# Patient Record
Sex: Female | Born: 1935 | Race: White | Hispanic: No | Smoking: Never smoker
Health system: Southern US, Community
[De-identification: ages and names within clinical notes are randomized; demographics above are authoritative.]

## PROBLEM LIST (undated history)

## (undated) DIAGNOSIS — E039 Hypothyroidism, unspecified: Secondary | ICD-10-CM

## (undated) DIAGNOSIS — M1990 Unspecified osteoarthritis, unspecified site: Secondary | ICD-10-CM

## (undated) DIAGNOSIS — K449 Diaphragmatic hernia without obstruction or gangrene: Secondary | ICD-10-CM

## (undated) DIAGNOSIS — K2970 Gastritis, unspecified, without bleeding: Secondary | ICD-10-CM

## (undated) DIAGNOSIS — S2232XA Fracture of one rib, left side, initial encounter for closed fracture: Secondary | ICD-10-CM

## (undated) DIAGNOSIS — J189 Pneumonia, unspecified organism: Secondary | ICD-10-CM

## (undated) DIAGNOSIS — K5790 Diverticulosis of intestine, part unspecified, without perforation or abscess without bleeding: Secondary | ICD-10-CM

## (undated) DIAGNOSIS — R932 Abnormal findings on diagnostic imaging of liver and biliary tract: Secondary | ICD-10-CM

## (undated) DIAGNOSIS — M5030 Other cervical disc degeneration, unspecified cervical region: Secondary | ICD-10-CM

## (undated) DIAGNOSIS — E049 Nontoxic goiter, unspecified: Secondary | ICD-10-CM

## (undated) DIAGNOSIS — K219 Gastro-esophageal reflux disease without esophagitis: Secondary | ICD-10-CM

## (undated) DIAGNOSIS — F32A Depression, unspecified: Secondary | ICD-10-CM

## (undated) DIAGNOSIS — D131 Benign neoplasm of stomach: Secondary | ICD-10-CM

## (undated) DIAGNOSIS — F419 Anxiety disorder, unspecified: Secondary | ICD-10-CM

## (undated) DIAGNOSIS — N390 Urinary tract infection, site not specified: Secondary | ICD-10-CM

## (undated) DIAGNOSIS — G4733 Obstructive sleep apnea (adult) (pediatric): Secondary | ICD-10-CM

## (undated) DIAGNOSIS — J449 Chronic obstructive pulmonary disease, unspecified: Secondary | ICD-10-CM

## (undated) DIAGNOSIS — J45909 Unspecified asthma, uncomplicated: Secondary | ICD-10-CM

## (undated) DIAGNOSIS — E559 Vitamin D deficiency, unspecified: Secondary | ICD-10-CM

## (undated) DIAGNOSIS — I359 Nonrheumatic aortic valve disorder, unspecified: Secondary | ICD-10-CM

## (undated) DIAGNOSIS — M810 Age-related osteoporosis without current pathological fracture: Secondary | ICD-10-CM

## (undated) DIAGNOSIS — F329 Major depressive disorder, single episode, unspecified: Secondary | ICD-10-CM

## (undated) DIAGNOSIS — K317 Polyp of stomach and duodenum: Secondary | ICD-10-CM

## (undated) DIAGNOSIS — K222 Esophageal obstruction: Secondary | ICD-10-CM

## (undated) DIAGNOSIS — K635 Polyp of colon: Secondary | ICD-10-CM

## (undated) DIAGNOSIS — E7800 Pure hypercholesterolemia, unspecified: Secondary | ICD-10-CM

## (undated) DIAGNOSIS — K2270 Barrett's esophagus without dysplasia: Secondary | ICD-10-CM

## (undated) DIAGNOSIS — K2210 Ulcer of esophagus without bleeding: Secondary | ICD-10-CM

## (undated) DIAGNOSIS — R109 Unspecified abdominal pain: Secondary | ICD-10-CM

## (undated) DIAGNOSIS — Z8719 Personal history of other diseases of the digestive system: Secondary | ICD-10-CM

## (undated) DIAGNOSIS — R55 Syncope and collapse: Secondary | ICD-10-CM

## (undated) DIAGNOSIS — G459 Transient cerebral ischemic attack, unspecified: Secondary | ICD-10-CM

## (undated) DIAGNOSIS — B029 Zoster without complications: Secondary | ICD-10-CM

## (undated) DIAGNOSIS — I350 Nonrheumatic aortic (valve) stenosis: Secondary | ICD-10-CM

## (undated) DIAGNOSIS — R7301 Impaired fasting glucose: Secondary | ICD-10-CM

## (undated) HISTORY — DX: Unspecified asthma, uncomplicated: J45.909

## (undated) HISTORY — DX: Zoster without complications: B02.9

## (undated) HISTORY — DX: Impaired fasting glucose: R73.01

## (undated) HISTORY — DX: Vitamin D deficiency, unspecified: E55.9

## (undated) HISTORY — PX: JOINT REPLACEMENT: SHX530

## (undated) HISTORY — DX: Gastritis, unspecified, without bleeding: K29.70

## (undated) HISTORY — DX: Esophageal obstruction: K22.2

## (undated) HISTORY — DX: Syncope and collapse: R55

## (undated) HISTORY — PX: COLONOSCOPY: SHX174

## (undated) HISTORY — DX: Hypothyroidism, unspecified: E03.9

## (undated) HISTORY — DX: Obstructive sleep apnea (adult) (pediatric): G47.33

## (undated) HISTORY — DX: Diaphragmatic hernia without obstruction or gangrene: K44.9

## (undated) HISTORY — PX: BREAST SURGERY: SHX581

## (undated) HISTORY — PX: TONSILLECTOMY: SUR1361

## (undated) HISTORY — DX: Polyp of colon: K63.5

## (undated) HISTORY — DX: Polyp of stomach and duodenum: K31.7

## (undated) HISTORY — DX: Barrett's esophagus without dysplasia: K22.70

## (undated) HISTORY — PX: ENUCLEATION: SHX628

## (undated) HISTORY — PX: REPLACEMENT UNICONDYLAR JOINT KNEE: SUR1227

## (undated) HISTORY — DX: Nonrheumatic aortic valve disorder, unspecified: I35.9

## (undated) HISTORY — DX: Urinary tract infection, site not specified: N39.0

## (undated) HISTORY — DX: Ulcer of esophagus without bleeding: K22.10

## (undated) HISTORY — DX: Age-related osteoporosis without current pathological fracture: M81.0

## (undated) HISTORY — DX: Chronic obstructive pulmonary disease, unspecified: J44.9

## (undated) HISTORY — PX: FRACTURE SURGERY: SHX138

## (undated) HISTORY — PX: HIP SURGERY: SHX245

## (undated) HISTORY — PX: EYE SURGERY: SHX253

## (undated) HISTORY — PX: COLONOSCOPY WITH ESOPHAGOGASTRODUODENOSCOPY (EGD): SHX5779

## (undated) HISTORY — PX: OTHER SURGICAL HISTORY: SHX169

## (undated) HISTORY — PX: BREAST EXCISIONAL BIOPSY: SUR124

## (undated) HISTORY — DX: Other cervical disc degeneration, unspecified cervical region: M50.30

---

## 2008-12-03 ENCOUNTER — Encounter: Payer: Self-pay | Admitting: Internal Medicine

## 2008-12-07 LAB — HM DIABETES FOOT EXAM: HM Diabetic Foot Exam: NORMAL

## 2011-02-26 ENCOUNTER — Emergency Department: Payer: Self-pay | Admitting: *Deleted

## 2011-02-26 DIAGNOSIS — R52 Pain, unspecified: Secondary | ICD-10-CM | POA: Diagnosis not present

## 2011-02-26 DIAGNOSIS — R6889 Other general symptoms and signs: Secondary | ICD-10-CM | POA: Diagnosis not present

## 2011-02-26 DIAGNOSIS — K8020 Calculus of gallbladder without cholecystitis without obstruction: Secondary | ICD-10-CM | POA: Diagnosis not present

## 2011-02-26 DIAGNOSIS — R109 Unspecified abdominal pain: Secondary | ICD-10-CM | POA: Diagnosis not present

## 2011-02-26 DIAGNOSIS — K5289 Other specified noninfective gastroenteritis and colitis: Secondary | ICD-10-CM | POA: Diagnosis not present

## 2011-02-26 LAB — CBC
HCT: 43.5 % (ref 35.0–47.0)
HGB: 14.5 g/dL (ref 12.0–16.0)
MCH: 30.8 pg (ref 26.0–34.0)
RBC: 4.71 10*6/uL (ref 3.80–5.20)

## 2011-02-26 LAB — COMPREHENSIVE METABOLIC PANEL
Albumin: 4 g/dL (ref 3.4–5.0)
Anion Gap: 10 (ref 7–16)
BUN: 12 mg/dL (ref 7–18)
Bilirubin,Total: 0.9 mg/dL (ref 0.2–1.0)
Co2: 30 mmol/L (ref 21–32)
EGFR (Non-African Amer.): >60
Glucose: 94 mg/dL (ref 65–99)
Osmolality: 283 (ref 275–301)
SGOT(AST): 21 U/L (ref 15–37)
SGPT (ALT): 20 U/L
Total Protein: 7 g/dL (ref 6.4–8.2)

## 2011-02-26 LAB — LIPASE, BLOOD: Lipase: 121 U/L (ref 73–393)

## 2011-02-28 ENCOUNTER — Emergency Department: Payer: Self-pay | Admitting: Emergency Medicine

## 2011-02-28 DIAGNOSIS — E7800 Pure hypercholesterolemia, unspecified: Secondary | ICD-10-CM | POA: Diagnosis not present

## 2011-02-28 DIAGNOSIS — E039 Hypothyroidism, unspecified: Secondary | ICD-10-CM | POA: Diagnosis not present

## 2011-02-28 DIAGNOSIS — K5289 Other specified noninfective gastroenteritis and colitis: Secondary | ICD-10-CM | POA: Diagnosis not present

## 2011-02-28 DIAGNOSIS — R197 Diarrhea, unspecified: Secondary | ICD-10-CM | POA: Diagnosis not present

## 2011-02-28 DIAGNOSIS — Z9889 Other specified postprocedural states: Secondary | ICD-10-CM | POA: Diagnosis not present

## 2011-02-28 DIAGNOSIS — Z79899 Other long term (current) drug therapy: Secondary | ICD-10-CM | POA: Diagnosis not present

## 2011-02-28 DIAGNOSIS — J45909 Unspecified asthma, uncomplicated: Secondary | ICD-10-CM | POA: Diagnosis not present

## 2011-02-28 LAB — CLOSTRIDIUM DIFFICILE BY PCR

## 2011-03-02 LAB — STOOL CULTURE

## 2011-03-22 DIAGNOSIS — E049 Nontoxic goiter, unspecified: Secondary | ICD-10-CM | POA: Diagnosis not present

## 2011-03-22 DIAGNOSIS — R109 Unspecified abdominal pain: Secondary | ICD-10-CM | POA: Diagnosis not present

## 2011-03-22 DIAGNOSIS — M810 Age-related osteoporosis without current pathological fracture: Secondary | ICD-10-CM | POA: Diagnosis not present

## 2011-03-22 DIAGNOSIS — K2270 Barrett's esophagus without dysplasia: Secondary | ICD-10-CM | POA: Diagnosis not present

## 2011-03-29 DIAGNOSIS — K2270 Barrett's esophagus without dysplasia: Secondary | ICD-10-CM | POA: Diagnosis not present

## 2011-03-29 DIAGNOSIS — Z86010 Personal history of colonic polyps: Secondary | ICD-10-CM | POA: Diagnosis not present

## 2011-03-29 DIAGNOSIS — A09 Infectious gastroenteritis and colitis, unspecified: Secondary | ICD-10-CM | POA: Diagnosis not present

## 2011-04-04 DIAGNOSIS — M25559 Pain in unspecified hip: Secondary | ICD-10-CM | POA: Diagnosis not present

## 2011-04-04 DIAGNOSIS — M779 Enthesopathy, unspecified: Secondary | ICD-10-CM | POA: Diagnosis not present

## 2011-04-13 ENCOUNTER — Ambulatory Visit: Payer: Self-pay | Admitting: Gastroenterology

## 2011-04-13 DIAGNOSIS — K222 Esophageal obstruction: Secondary | ICD-10-CM | POA: Diagnosis not present

## 2011-04-13 DIAGNOSIS — K2970 Gastritis, unspecified, without bleeding: Secondary | ICD-10-CM | POA: Diagnosis not present

## 2011-04-13 DIAGNOSIS — G4730 Sleep apnea, unspecified: Secondary | ICD-10-CM | POA: Diagnosis not present

## 2011-04-13 DIAGNOSIS — Z7982 Long term (current) use of aspirin: Secondary | ICD-10-CM | POA: Diagnosis not present

## 2011-04-13 DIAGNOSIS — Z79899 Other long term (current) drug therapy: Secondary | ICD-10-CM | POA: Diagnosis not present

## 2011-04-13 DIAGNOSIS — H5440 Blindness, one eye, unspecified eye: Secondary | ICD-10-CM | POA: Diagnosis not present

## 2011-04-13 DIAGNOSIS — J438 Other emphysema: Secondary | ICD-10-CM | POA: Diagnosis not present

## 2011-04-13 DIAGNOSIS — K2990 Gastroduodenitis, unspecified, without bleeding: Secondary | ICD-10-CM | POA: Diagnosis not present

## 2011-04-13 DIAGNOSIS — K2270 Barrett's esophagus without dysplasia: Secondary | ICD-10-CM | POA: Diagnosis not present

## 2011-04-13 DIAGNOSIS — E079 Disorder of thyroid, unspecified: Secondary | ICD-10-CM | POA: Diagnosis not present

## 2011-04-13 DIAGNOSIS — K2940 Chronic atrophic gastritis without bleeding: Secondary | ICD-10-CM | POA: Diagnosis not present

## 2011-04-13 DIAGNOSIS — Z86010 Personal history of colonic polyps: Secondary | ICD-10-CM | POA: Diagnosis not present

## 2011-04-13 DIAGNOSIS — R42 Dizziness and giddiness: Secondary | ICD-10-CM | POA: Diagnosis not present

## 2011-04-17 LAB — PATHOLOGY REPORT

## 2011-05-02 DIAGNOSIS — K2940 Chronic atrophic gastritis without bleeding: Secondary | ICD-10-CM | POA: Diagnosis not present

## 2011-05-03 DIAGNOSIS — Z79899 Other long term (current) drug therapy: Secondary | ICD-10-CM | POA: Diagnosis not present

## 2011-05-03 DIAGNOSIS — E039 Hypothyroidism, unspecified: Secondary | ICD-10-CM | POA: Diagnosis not present

## 2011-05-03 DIAGNOSIS — I10 Essential (primary) hypertension: Secondary | ICD-10-CM | POA: Diagnosis not present

## 2011-05-03 DIAGNOSIS — E7800 Pure hypercholesterolemia, unspecified: Secondary | ICD-10-CM | POA: Diagnosis not present

## 2011-05-14 DIAGNOSIS — E7800 Pure hypercholesterolemia, unspecified: Secondary | ICD-10-CM | POA: Diagnosis not present

## 2011-05-14 DIAGNOSIS — E049 Nontoxic goiter, unspecified: Secondary | ICD-10-CM | POA: Diagnosis not present

## 2011-05-14 DIAGNOSIS — M810 Age-related osteoporosis without current pathological fracture: Secondary | ICD-10-CM | POA: Diagnosis not present

## 2011-05-14 DIAGNOSIS — Z1239 Encounter for other screening for malignant neoplasm of breast: Secondary | ICD-10-CM | POA: Diagnosis not present

## 2011-06-26 ENCOUNTER — Ambulatory Visit: Payer: Self-pay | Admitting: Internal Medicine

## 2011-06-26 DIAGNOSIS — R928 Other abnormal and inconclusive findings on diagnostic imaging of breast: Secondary | ICD-10-CM | POA: Diagnosis not present

## 2011-06-26 DIAGNOSIS — M899 Disorder of bone, unspecified: Secondary | ICD-10-CM | POA: Diagnosis not present

## 2011-06-26 DIAGNOSIS — Z13820 Encounter for screening for osteoporosis: Secondary | ICD-10-CM | POA: Diagnosis not present

## 2011-06-26 DIAGNOSIS — Z1231 Encounter for screening mammogram for malignant neoplasm of breast: Secondary | ICD-10-CM | POA: Diagnosis not present

## 2011-06-26 DIAGNOSIS — M810 Age-related osteoporosis without current pathological fracture: Secondary | ICD-10-CM | POA: Diagnosis not present

## 2011-06-26 LAB — HM DEXA SCAN

## 2011-07-05 DIAGNOSIS — M1710 Unilateral primary osteoarthritis, unspecified knee: Secondary | ICD-10-CM | POA: Diagnosis not present

## 2011-07-05 DIAGNOSIS — IMO0002 Reserved for concepts with insufficient information to code with codable children: Secondary | ICD-10-CM | POA: Diagnosis not present

## 2011-07-05 DIAGNOSIS — M810 Age-related osteoporosis without current pathological fracture: Secondary | ICD-10-CM | POA: Diagnosis not present

## 2011-07-26 DIAGNOSIS — M1710 Unilateral primary osteoarthritis, unspecified knee: Secondary | ICD-10-CM | POA: Diagnosis not present

## 2011-07-26 DIAGNOSIS — M25559 Pain in unspecified hip: Secondary | ICD-10-CM | POA: Diagnosis not present

## 2011-09-06 DIAGNOSIS — M25569 Pain in unspecified knee: Secondary | ICD-10-CM | POA: Diagnosis not present

## 2011-09-07 DIAGNOSIS — M25569 Pain in unspecified knee: Secondary | ICD-10-CM | POA: Diagnosis not present

## 2011-09-10 DIAGNOSIS — M25569 Pain in unspecified knee: Secondary | ICD-10-CM | POA: Diagnosis not present

## 2011-09-11 DIAGNOSIS — R933 Abnormal findings on diagnostic imaging of other parts of digestive tract: Secondary | ICD-10-CM | POA: Diagnosis not present

## 2011-09-11 DIAGNOSIS — K2100 Gastro-esophageal reflux disease with esophagitis, without bleeding: Secondary | ICD-10-CM | POA: Diagnosis not present

## 2011-09-12 DIAGNOSIS — M25569 Pain in unspecified knee: Secondary | ICD-10-CM | POA: Diagnosis not present

## 2011-09-14 ENCOUNTER — Ambulatory Visit: Payer: Self-pay | Admitting: Gastroenterology

## 2011-09-14 DIAGNOSIS — K8020 Calculus of gallbladder without cholecystitis without obstruction: Secondary | ICD-10-CM | POA: Diagnosis not present

## 2011-09-14 DIAGNOSIS — R933 Abnormal findings on diagnostic imaging of other parts of digestive tract: Secondary | ICD-10-CM | POA: Diagnosis not present

## 2011-09-14 DIAGNOSIS — M25569 Pain in unspecified knee: Secondary | ICD-10-CM | POA: Diagnosis not present

## 2011-09-17 DIAGNOSIS — M25569 Pain in unspecified knee: Secondary | ICD-10-CM | POA: Diagnosis not present

## 2011-09-19 DIAGNOSIS — M25569 Pain in unspecified knee: Secondary | ICD-10-CM | POA: Diagnosis not present

## 2011-09-21 DIAGNOSIS — M25569 Pain in unspecified knee: Secondary | ICD-10-CM | POA: Diagnosis not present

## 2011-09-23 DIAGNOSIS — M25569 Pain in unspecified knee: Secondary | ICD-10-CM | POA: Diagnosis not present

## 2011-09-24 DIAGNOSIS — M25569 Pain in unspecified knee: Secondary | ICD-10-CM | POA: Diagnosis not present

## 2011-09-25 DIAGNOSIS — K219 Gastro-esophageal reflux disease without esophagitis: Secondary | ICD-10-CM | POA: Diagnosis not present

## 2011-09-25 DIAGNOSIS — R933 Abnormal findings on diagnostic imaging of other parts of digestive tract: Secondary | ICD-10-CM | POA: Diagnosis not present

## 2011-09-26 DIAGNOSIS — M25569 Pain in unspecified knee: Secondary | ICD-10-CM | POA: Diagnosis not present

## 2011-10-01 DIAGNOSIS — M25569 Pain in unspecified knee: Secondary | ICD-10-CM | POA: Diagnosis not present

## 2011-10-03 DIAGNOSIS — M25569 Pain in unspecified knee: Secondary | ICD-10-CM | POA: Diagnosis not present

## 2011-10-05 DIAGNOSIS — M25569 Pain in unspecified knee: Secondary | ICD-10-CM | POA: Diagnosis not present

## 2011-10-08 DIAGNOSIS — M25569 Pain in unspecified knee: Secondary | ICD-10-CM | POA: Diagnosis not present

## 2011-10-10 DIAGNOSIS — T2650XA Corrosion of unspecified eyelid and periocular area, initial encounter: Secondary | ICD-10-CM | POA: Diagnosis not present

## 2011-10-13 DIAGNOSIS — H579 Unspecified disorder of eye and adnexa: Secondary | ICD-10-CM | POA: Diagnosis not present

## 2011-11-05 DIAGNOSIS — M810 Age-related osteoporosis without current pathological fracture: Secondary | ICD-10-CM | POA: Diagnosis not present

## 2011-11-06 DIAGNOSIS — M1710 Unilateral primary osteoarthritis, unspecified knee: Secondary | ICD-10-CM | POA: Diagnosis not present

## 2011-11-06 DIAGNOSIS — M25559 Pain in unspecified hip: Secondary | ICD-10-CM | POA: Diagnosis not present

## 2011-11-21 DIAGNOSIS — J342 Deviated nasal septum: Secondary | ICD-10-CM | POA: Diagnosis not present

## 2011-11-21 DIAGNOSIS — G4733 Obstructive sleep apnea (adult) (pediatric): Secondary | ICD-10-CM | POA: Diagnosis not present

## 2011-12-04 DIAGNOSIS — Z23 Encounter for immunization: Secondary | ICD-10-CM | POA: Diagnosis not present

## 2011-12-12 DIAGNOSIS — H4010X0 Unspecified open-angle glaucoma, stage unspecified: Secondary | ICD-10-CM | POA: Diagnosis not present

## 2012-01-14 DIAGNOSIS — J45909 Unspecified asthma, uncomplicated: Secondary | ICD-10-CM | POA: Diagnosis not present

## 2012-01-14 DIAGNOSIS — M129 Arthropathy, unspecified: Secondary | ICD-10-CM | POA: Diagnosis not present

## 2012-01-14 DIAGNOSIS — J449 Chronic obstructive pulmonary disease, unspecified: Secondary | ICD-10-CM | POA: Diagnosis not present

## 2012-01-19 DIAGNOSIS — J209 Acute bronchitis, unspecified: Secondary | ICD-10-CM | POA: Diagnosis not present

## 2012-01-19 DIAGNOSIS — R05 Cough: Secondary | ICD-10-CM | POA: Diagnosis not present

## 2012-01-19 DIAGNOSIS — R059 Cough, unspecified: Secondary | ICD-10-CM | POA: Diagnosis not present

## 2012-01-31 ENCOUNTER — Ambulatory Visit: Payer: Self-pay | Admitting: Internal Medicine

## 2012-01-31 DIAGNOSIS — J449 Chronic obstructive pulmonary disease, unspecified: Secondary | ICD-10-CM | POA: Diagnosis not present

## 2012-02-01 DIAGNOSIS — N390 Urinary tract infection, site not specified: Secondary | ICD-10-CM | POA: Diagnosis not present

## 2012-02-05 DIAGNOSIS — E049 Nontoxic goiter, unspecified: Secondary | ICD-10-CM | POA: Diagnosis not present

## 2012-02-05 DIAGNOSIS — R011 Cardiac murmur, unspecified: Secondary | ICD-10-CM | POA: Diagnosis not present

## 2012-02-05 DIAGNOSIS — R5381 Other malaise: Secondary | ICD-10-CM | POA: Diagnosis not present

## 2012-02-05 DIAGNOSIS — G4733 Obstructive sleep apnea (adult) (pediatric): Secondary | ICD-10-CM | POA: Diagnosis not present

## 2012-02-05 DIAGNOSIS — R5383 Other fatigue: Secondary | ICD-10-CM | POA: Diagnosis not present

## 2012-02-06 DIAGNOSIS — E049 Nontoxic goiter, unspecified: Secondary | ICD-10-CM | POA: Diagnosis not present

## 2012-02-06 DIAGNOSIS — R5381 Other malaise: Secondary | ICD-10-CM | POA: Diagnosis not present

## 2012-02-06 DIAGNOSIS — J449 Chronic obstructive pulmonary disease, unspecified: Secondary | ICD-10-CM | POA: Diagnosis not present

## 2012-02-06 DIAGNOSIS — D518 Other vitamin B12 deficiency anemias: Secondary | ICD-10-CM | POA: Diagnosis not present

## 2012-02-06 DIAGNOSIS — E559 Vitamin D deficiency, unspecified: Secondary | ICD-10-CM | POA: Diagnosis not present

## 2012-02-06 DIAGNOSIS — D649 Anemia, unspecified: Secondary | ICD-10-CM | POA: Diagnosis not present

## 2012-02-06 DIAGNOSIS — G4733 Obstructive sleep apnea (adult) (pediatric): Secondary | ICD-10-CM | POA: Diagnosis not present

## 2012-02-06 DIAGNOSIS — R5383 Other fatigue: Secondary | ICD-10-CM | POA: Diagnosis not present

## 2012-02-12 DIAGNOSIS — J449 Chronic obstructive pulmonary disease, unspecified: Secondary | ICD-10-CM | POA: Insufficient documentation

## 2012-02-23 DIAGNOSIS — R079 Chest pain, unspecified: Secondary | ICD-10-CM | POA: Diagnosis not present

## 2012-02-23 DIAGNOSIS — X58XXXA Exposure to other specified factors, initial encounter: Secondary | ICD-10-CM | POA: Diagnosis not present

## 2012-02-23 DIAGNOSIS — S2249XA Multiple fractures of ribs, unspecified side, initial encounter for closed fracture: Secondary | ICD-10-CM | POA: Diagnosis not present

## 2012-02-27 ENCOUNTER — Ambulatory Visit: Payer: Self-pay | Admitting: Internal Medicine

## 2012-02-27 DIAGNOSIS — R011 Cardiac murmur, unspecified: Secondary | ICD-10-CM | POA: Diagnosis not present

## 2012-02-27 DIAGNOSIS — I059 Rheumatic mitral valve disease, unspecified: Secondary | ICD-10-CM | POA: Diagnosis not present

## 2012-03-04 DIAGNOSIS — I350 Nonrheumatic aortic (valve) stenosis: Secondary | ICD-10-CM

## 2012-03-04 DIAGNOSIS — J449 Chronic obstructive pulmonary disease, unspecified: Secondary | ICD-10-CM | POA: Diagnosis not present

## 2012-03-04 DIAGNOSIS — R7301 Impaired fasting glucose: Secondary | ICD-10-CM

## 2012-03-04 DIAGNOSIS — G4733 Obstructive sleep apnea (adult) (pediatric): Secondary | ICD-10-CM | POA: Diagnosis not present

## 2012-03-04 DIAGNOSIS — R5381 Other malaise: Secondary | ICD-10-CM | POA: Diagnosis not present

## 2012-03-04 DIAGNOSIS — I359 Nonrheumatic aortic valve disorder, unspecified: Secondary | ICD-10-CM | POA: Diagnosis not present

## 2012-03-04 HISTORY — DX: Nonrheumatic aortic (valve) stenosis: I35.0

## 2012-03-04 HISTORY — DX: Impaired fasting glucose: R73.01

## 2012-03-18 DIAGNOSIS — J449 Chronic obstructive pulmonary disease, unspecified: Secondary | ICD-10-CM | POA: Diagnosis not present

## 2012-03-18 DIAGNOSIS — R0602 Shortness of breath: Secondary | ICD-10-CM | POA: Diagnosis not present

## 2012-04-01 DIAGNOSIS — K2100 Gastro-esophageal reflux disease with esophagitis, without bleeding: Secondary | ICD-10-CM | POA: Diagnosis not present

## 2012-05-12 DIAGNOSIS — M810 Age-related osteoporosis without current pathological fracture: Secondary | ICD-10-CM | POA: Diagnosis not present

## 2012-06-02 DIAGNOSIS — G4733 Obstructive sleep apnea (adult) (pediatric): Secondary | ICD-10-CM | POA: Diagnosis not present

## 2012-06-02 DIAGNOSIS — I359 Nonrheumatic aortic valve disorder, unspecified: Secondary | ICD-10-CM | POA: Diagnosis not present

## 2012-06-02 DIAGNOSIS — R5381 Other malaise: Secondary | ICD-10-CM | POA: Diagnosis not present

## 2012-06-02 DIAGNOSIS — J45909 Unspecified asthma, uncomplicated: Secondary | ICD-10-CM | POA: Diagnosis not present

## 2012-06-10 DIAGNOSIS — R079 Chest pain, unspecified: Secondary | ICD-10-CM | POA: Diagnosis not present

## 2012-06-12 DIAGNOSIS — H40009 Preglaucoma, unspecified, unspecified eye: Secondary | ICD-10-CM | POA: Diagnosis not present

## 2012-06-25 LAB — HM MAMMOGRAPHY: HM Mammogram: NORMAL

## 2012-06-28 ENCOUNTER — Emergency Department: Payer: Self-pay | Admitting: Emergency Medicine

## 2012-06-28 DIAGNOSIS — M129 Arthropathy, unspecified: Secondary | ICD-10-CM | POA: Diagnosis not present

## 2012-06-28 DIAGNOSIS — M064 Inflammatory polyarthropathy: Secondary | ICD-10-CM | POA: Diagnosis not present

## 2012-06-28 DIAGNOSIS — Z79899 Other long term (current) drug therapy: Secondary | ICD-10-CM | POA: Diagnosis not present

## 2012-06-28 DIAGNOSIS — E785 Hyperlipidemia, unspecified: Secondary | ICD-10-CM | POA: Diagnosis not present

## 2012-06-30 DIAGNOSIS — M1710 Unilateral primary osteoarthritis, unspecified knee: Secondary | ICD-10-CM | POA: Diagnosis not present

## 2012-07-21 DIAGNOSIS — R7301 Impaired fasting glucose: Secondary | ICD-10-CM | POA: Diagnosis not present

## 2012-07-21 DIAGNOSIS — R5381 Other malaise: Secondary | ICD-10-CM | POA: Diagnosis not present

## 2012-07-21 LAB — TSH: TSH: 2.47 u[IU]/mL (ref 0.41–5.90)

## 2012-07-21 LAB — BASIC METABOLIC PANEL
Creatinine: 0.6 mg/dL (ref 0.5–1.1)
Glucose: 87 mg/dL

## 2012-08-07 ENCOUNTER — Encounter: Payer: Self-pay | Admitting: Cardiovascular Disease

## 2012-08-07 ENCOUNTER — Ambulatory Visit (INDEPENDENT_AMBULATORY_CARE_PROVIDER_SITE_OTHER): Payer: Medicare Other | Admitting: Cardiovascular Disease

## 2012-08-07 VITALS — BP 100/78 | HR 70 | Ht 66.0 in | Wt 137.5 lb

## 2012-08-07 DIAGNOSIS — J4530 Mild persistent asthma, uncomplicated: Secondary | ICD-10-CM

## 2012-08-07 DIAGNOSIS — G4733 Obstructive sleep apnea (adult) (pediatric): Secondary | ICD-10-CM | POA: Insufficient documentation

## 2012-08-07 DIAGNOSIS — R0602 Shortness of breath: Secondary | ICD-10-CM

## 2012-08-07 DIAGNOSIS — E785 Hyperlipidemia, unspecified: Secondary | ICD-10-CM

## 2012-08-07 DIAGNOSIS — J45909 Unspecified asthma, uncomplicated: Secondary | ICD-10-CM

## 2012-08-07 DIAGNOSIS — R5383 Other fatigue: Secondary | ICD-10-CM | POA: Diagnosis not present

## 2012-08-07 DIAGNOSIS — R42 Dizziness and giddiness: Secondary | ICD-10-CM | POA: Diagnosis not present

## 2012-08-07 DIAGNOSIS — R5381 Other malaise: Secondary | ICD-10-CM

## 2012-08-07 HISTORY — DX: Unspecified asthma, uncomplicated: J45.909

## 2012-08-07 NOTE — Assessment & Plan Note (Addendum)
Etiology of her waxing waning shortness of breath is uncertain. Notes were reviewed from Dr. Alison Murray. Review of her echocardiogram and clinical exam does not suggest aortic valve stenosis. I think this is a report mistake. There is no elevated velocity, pressure gradient across the valve and aortic valve area is normal consistent with a normal aortic valve. I will review the images to make sure that this is correct.  She is low likelihood of underlying coronary artery disease given no smoking, no diabetes, well controlled cholesterol. I suggested shortness of breath gets worse with exertion, she could call our office for a stress echocardiogram. She wanted to hold off at this time and monitor her symptoms. She did not want stress nuclear test.

## 2012-08-07 NOTE — Assessment & Plan Note (Signed)
We have encouraged her to stay on her statin 

## 2012-08-07 NOTE — Patient Instructions (Addendum)
You are doing well. No medication changes were made.  Please call us if you have new issues that need to be addressed before your next appt.    

## 2012-08-07 NOTE — Progress Notes (Signed)
Patient ID: Dorothy Church, female    DOB: Apr 27, 1935, 77 y.o.   MRN: [id number]  HPI Comments: Dorothy Church is a very pleasant 77 year old woman with a history of asthma, obstructive sleep apnea who wears CPAP for the past 3 years, hyperlipidemia who presents by referral from Dr. Delfin Edis for evaluation of aortic valve stenosis, fatigue, shortness of breath.  She states that she has had waxing waning fatigue. Uncertain if she has shortness of breath with exertion. Some days she is "dragging" other day she does well. Some days she gets up and takes her doctor walk and when she gets home she is very tired and has to lie down. This is not a regular thing, very inconsistent. Other days she is fine. She does do regular activities, some exercise. She denies that asthma has been a recent issue. No recent m edema. She does have low blood pressure on a regular basis. She tries to eat very healthy, fruits and vegetables and watches her weight. She has known about low blood pressure for a long time.  She had an echocardiogram January 2014 read by Dr. Gwen Pounds, [facility]. The report suggested mild to moderate aortic valve stenosis. This was only mentioned in the details of the report, not in the main conclusion interpretation. Review of the numbers do not suggest aortic valve stenosis. Her aortic valve area is 2.7 cm which is essentially normal, max velocity and pressure gradients were not elevated consistent with normal valve. She has normal ejection fraction.  EKG shows normal sinus rhythm with rate 70 beats per minute, no significant ST or T wave changes     Outpatient Encounter Prescriptions as of 08/07/2012  Medication Sig Dispense Refill  . albuterol (VENTOLIN HFA) 108 (90 BASE) MCG/ACT inhaler Inhale 2 puffs into the lungs every 6 (six) hours as needed for wheezing.      Marland Kitchen aspirin 81 MG tablet Take 81 mg by mouth daily.      . budesonide-formoterol (SYMBICORT) 80-4.5 MCG/ACT inhaler Inhale 2 puffs  into the lungs 2 (two) times daily.      . calcium carbonate (OS-CAL) 600 MG TABS Take 600 mg by mouth daily.      Marland Kitchen denosumab (PROLIA) 60 MG/ML SOLN injection Inject 60 mg into the skin every 6 (six) months. Administer in upper arm, thigh, or abdomen      . esomeprazole (NEXIUM) 40 MG capsule Take 40 mg by mouth daily before breakfast.      . levothyroxine (SYNTHROID, LEVOTHROID) 50 MCG tablet Take 50 mcg by mouth daily before breakfast.      . Multiple Vitamin (MULTIVITAMIN) tablet Take 1 tablet by mouth daily.      Marland Kitchen PARoxetine (PAXIL) 30 MG tablet Take 30 mg by mouth every morning.      . simvastatin (ZOCOR) 40 MG tablet Take 40 mg by mouth every evening.        Review of Systems  Constitutional: Positive for fatigue.  HENT: Negative.   Eyes: Negative.   Respiratory: Positive for shortness of breath.   Cardiovascular: Negative.   Gastrointestinal: Negative.   Musculoskeletal: Negative.   Skin: Negative.   Neurological: Negative.   Psychiatric/Behavioral: Negative.   All other systems reviewed and are negative.    BP 100/78  Pulse 70  Ht 5\' 6"  (1.676 m)  Wt 137 lb 8 oz (62.37 kg)  BMI 22.2 kg/m2   Physical Exam  Nursing note and vitals reviewed. Constitutional: She is oriented to person, place, and time. She  appears well-developed and well-nourished.  HENT:  Head: Normocephalic.  Nose: Nose normal.  Mouth/Throat: Oropharynx is clear and moist.  Eyes: Conjunctivae are normal. Pupils are equal, round, and reactive to light.  Neck: Normal range of motion. Neck supple. No JVD present.  Cardiovascular: Normal rate, regular rhythm, S1 normal, S2 normal, normal heart sounds and intact distal pulses.  Exam reveals no gallop and no friction rub.   No murmur heard. Pulmonary/Chest: Effort normal and breath sounds normal. No respiratory distress. She has no wheezes. She has no rales. She exhibits no tenderness.  Abdominal: Soft. Bowel sounds are normal. She exhibits no distension.  There is no tenderness.  Musculoskeletal: Normal range of motion. She exhibits no edema and no tenderness.  Lymphadenopathy:    She has no cervical adenopathy.  Neurological: She is alert and oriented to person, place, and time. Coordination normal.  Skin: Skin is warm and dry. No rash noted. No erythema.  Psychiatric: She has a normal mood and affect. Her behavior is normal. Judgment and thought content normal.    Assessment and Plan

## 2012-08-07 NOTE — Assessment & Plan Note (Signed)
She states that she sleeps well at nighttime. Tolerating CPAP well.

## 2012-08-07 NOTE — Assessment & Plan Note (Signed)
She reports that asthma is well controlled on Symbicort, occasional albuterol.

## 2012-08-07 NOTE — Assessment & Plan Note (Signed)
Some waxing waning fatigue. We have encouraged her to continue regular exercise. Unlikely that this is related to any possible valve issue as none is on exam, echo was questionable. Again we have suggested if symptoms get worse, we could do further testing such as stress echocardiogram. She will call us if she would like to order this.

## 2012-08-07 NOTE — Assessment & Plan Note (Signed)
Occasional dizziness, likely orthostasis. Blood pressure is low. Encouraged her to salt load and increase fluids.

## 2012-08-11 ENCOUNTER — Ambulatory Visit: Payer: Self-pay | Admitting: Cardiovascular Disease

## 2012-08-12 DIAGNOSIS — IMO0002 Reserved for concepts with insufficient information to code with codable children: Secondary | ICD-10-CM | POA: Diagnosis not present

## 2012-08-14 DIAGNOSIS — R5381 Other malaise: Secondary | ICD-10-CM | POA: Diagnosis not present

## 2012-08-14 DIAGNOSIS — E7800 Pure hypercholesterolemia, unspecified: Secondary | ICD-10-CM | POA: Diagnosis not present

## 2012-08-14 DIAGNOSIS — R7301 Impaired fasting glucose: Secondary | ICD-10-CM | POA: Diagnosis not present

## 2012-08-14 DIAGNOSIS — R5383 Other fatigue: Secondary | ICD-10-CM | POA: Diagnosis not present

## 2012-08-14 DIAGNOSIS — K2270 Barrett's esophagus without dysplasia: Secondary | ICD-10-CM | POA: Diagnosis not present

## 2012-09-04 DIAGNOSIS — M76899 Other specified enthesopathies of unspecified lower limb, excluding foot: Secondary | ICD-10-CM | POA: Diagnosis not present

## 2012-09-04 DIAGNOSIS — Z96659 Presence of unspecified artificial knee joint: Secondary | ICD-10-CM | POA: Diagnosis not present

## 2012-10-07 ENCOUNTER — Ambulatory Visit (INDEPENDENT_AMBULATORY_CARE_PROVIDER_SITE_OTHER): Payer: Medicare Other | Admitting: Family Medicine

## 2012-10-07 ENCOUNTER — Encounter: Payer: Self-pay | Admitting: Family Medicine

## 2012-10-07 VITALS — BP 110/68 | HR 76 | Temp 97.9°F | Ht 66.5 in | Wt 134.0 lb

## 2012-10-07 DIAGNOSIS — Z8739 Personal history of other diseases of the musculoskeletal system and connective tissue: Secondary | ICD-10-CM | POA: Insufficient documentation

## 2012-10-07 DIAGNOSIS — E785 Hyperlipidemia, unspecified: Secondary | ICD-10-CM | POA: Diagnosis not present

## 2012-10-07 DIAGNOSIS — M25569 Pain in unspecified knee: Secondary | ICD-10-CM

## 2012-10-07 DIAGNOSIS — E559 Vitamin D deficiency, unspecified: Secondary | ICD-10-CM | POA: Diagnosis not present

## 2012-10-07 DIAGNOSIS — M810 Age-related osteoporosis without current pathological fracture: Secondary | ICD-10-CM

## 2012-10-07 DIAGNOSIS — G4733 Obstructive sleep apnea (adult) (pediatric): Secondary | ICD-10-CM

## 2012-10-07 DIAGNOSIS — E039 Hypothyroidism, unspecified: Secondary | ICD-10-CM

## 2012-10-07 DIAGNOSIS — J45909 Unspecified asthma, uncomplicated: Secondary | ICD-10-CM | POA: Diagnosis not present

## 2012-10-07 DIAGNOSIS — M25562 Pain in left knee: Secondary | ICD-10-CM | POA: Insufficient documentation

## 2012-10-07 NOTE — Progress Notes (Signed)
Patient ID: Rosely Fernandez, female    DOB: October 14, 1935, 77 y.o.   MRN: [id number]  HPI Comments:  Very pleasant 77 yo female with a history of asthma, obstructive sleep apnea who wears CPAP for the past 3 years, hyperlipidemia, OA, osteoporosis here to establish care.  Was seeing Dr. Delfin Edis whom has since left her practice.    SOB- persistent issue.  Life long asthma.  There was a question of AS from previous cardiologist.  Micah Flesher to Dr Mariah Milling for 2nd opinion- note reviewed.  He felt this was not a cardiac issue and unlikely AS. She has been wearing CPAP for past 3 years.  This has not helped with her fatigue or SOB dramatically. She cannot remember who she sees for this.  Has seen Dr. Meredeth Ide in past.  TSH normal in 07/2012.  OA- s/p TKR 34 years ago.  Seeing Dr. Ernest Pine for persistent left knee pain in area of TKA.  Has another appt with him next week.  Osteoporosis- UTD DEXA.  She is receiving prolia.  Last injection 04/2012.  Patient Active Problem List   Diagnosis Date Noted  . Left knee pain 10/07/2012  . Osteoporosis, unspecified 10/07/2012  . Vitamin D deficiency   . Hypothyroidism   . SOB (shortness of breath) 08/07/2012  . Fatigue 08/07/2012  . Dizziness 08/07/2012  . Hyperlipidemia 08/07/2012  . Obstructive sleep apnea on CPAP 08/07/2012  . Asthma 08/07/2012   Past Medical History  Diagnosis Date  . Intrinsic asthma, unspecified   . Aortic valve disorders   . Obstructive sleep apnea (adult) (pediatric)   . Vitamin D deficiency   . Barrett's esophagus   . Impaired fasting glucose   . COPD (chronic obstructive pulmonary disease)   . Syncope and collapse   . Hypothyroidism   . Osteoporosis    Past Surgical History  Procedure Laterality Date  . Replacement unicondylar joint knee    . Hip surgery    . Colonoscopy    . Eye surgery    . Prosthesis eye implant    . Tonsillectomy     History  Substance Use Topics  . Smoking status: Never Smoker   . Smokeless  tobacco: Not on file  . Alcohol Use: Yes     Comment: occas. wine   Family History  Problem Relation Age of Onset  . Heart disease Father   . Bipolar disorder Brother    Allergies  Allergen Reactions  . Amoxicillin   . Cefzil [Cefprozil]   . Cephalexin   . Relafen [Nabumetone]   . Talwin [Pentazocine]    Current Outpatient Prescriptions on File Prior to Visit  Medication Sig Dispense Refill  . albuterol (VENTOLIN HFA) 108 (90 BASE) MCG/ACT inhaler Inhale 2 puffs into the lungs every 6 (six) hours as needed for wheezing.      Marland Kitchen aspirin 81 MG tablet Take 81 mg by mouth daily.      . budesonide-formoterol (SYMBICORT) 80-4.5 MCG/ACT inhaler Inhale 2 puffs into the lungs 2 (two) times daily.      . calcium carbonate (OS-CAL) 600 MG TABS Take 600 mg by mouth daily.      Marland Kitchen denosumab (PROLIA) 60 MG/ML SOLN injection Inject 60 mg into the skin every 6 (six) months. Administer in upper arm, thigh, or abdomen      . esomeprazole (NEXIUM) 40 MG capsule Take 40 mg by mouth daily before breakfast.      . levothyroxine (SYNTHROID, LEVOTHROID) 50 MCG tablet Take 50 mcg  by mouth daily before breakfast.      . Multiple Vitamin (MULTIVITAMIN) tablet Take 1 tablet by mouth daily.      Marland Kitchen PARoxetine (PAXIL) 30 MG tablet Take 30 mg by mouth every morning.      . simvastatin (ZOCOR) 40 MG tablet Take 40 mg by mouth every evening.       No current facility-administered medications on file prior to visit.   The PMH, PSH, Social History, Family History, Medications, and allergies have been reviewed in [facility], and have been updated if relevant.     Outpatient Encounter Prescriptions as of 08/07/2012  Medication Sig Dispense Refill  . albuterol (VENTOLIN HFA) 108 (90 BASE) MCG/ACT inhaler Inhale 2 puffs into the lungs every 6 (six) hours as needed for wheezing.      Marland Kitchen aspirin 81 MG tablet Take 81 mg by mouth daily.      . budesonide-formoterol (SYMBICORT) 80-4.5 MCG/ACT inhaler Inhale 2 puffs into the lungs  2 (two) times daily.      . calcium carbonate (OS-CAL) 600 MG TABS Take 600 mg by mouth daily.      Marland Kitchen denosumab (PROLIA) 60 MG/ML SOLN injection Inject 60 mg into the skin every 6 (six) months. Administer in upper arm, thigh, or abdomen      . esomeprazole (NEXIUM) 40 MG capsule Take 40 mg by mouth daily before breakfast.      . levothyroxine (SYNTHROID, LEVOTHROID) 50 MCG tablet Take 50 mcg by mouth daily before breakfast.      . Multiple Vitamin (MULTIVITAMIN) tablet Take 1 tablet by mouth daily.      Marland Kitchen PARoxetine (PAXIL) 30 MG tablet Take 30 mg by mouth every morning.      . simvastatin (ZOCOR) 40 MG tablet Take 40 mg by mouth every evening.        Review of Systems  Constitutional: Positive for fatigue.  HENT: Negative.   Eyes: Negative.   Respiratory: Positive for shortness of breath.   Cardiovascular: Negative.   Gastrointestinal: Negative.   Musculoskeletal: Negative.   Skin: Negative.   Neurological: Negative.   Psychiatric/Behavioral: Negative.   All other systems reviewed and are negative.    BP 110/68  Pulse 76  Temp(Src) 97.9 F (36.6 C)  Ht 5' 6.5" (1.689 m)  Wt 134 lb (60.782 kg)  BMI 21.31 kg/m2   Physical Exam  Nursing note and vitals reviewed. Constitutional: She is oriented to person, place, and time. She appears well-developed and well-nourished.  HENT:  Head: Normocephalic.  Nose: Nose normal.  Mouth/Throat: Oropharynx is clear and moist.  Eyes: Conjunctivae are normal. Pupils are equal, round, and reactive to light.  Neck: Normal range of motion. Neck supple. No JVD present.  Cardiovascular: Normal rate, regular rhythm, S1 normal, S2 normal, normal heart sounds and intact distal pulses.  Exam reveals no gallop and no friction rub.   No murmur heard. Pulmonary/Chest: Effort normal and breath sounds normal. No respiratory distress. She has no wheezes. She has no rales. She exhibits no tenderness.  Abdominal: Soft. Bowel sounds are normal. She exhibits  no distension. There is no tenderness.  Musculoskeletal: Normal range of motion. She exhibits no edema and no tenderness.  Lymphadenopathy:    She has no cervical adenopathy.  Neurological: She is alert and oriented to person, place, and time. Coordination normal.  Skin: Skin is warm and dry. No rash noted. No erythema.  Psychiatric: She has a normal mood and affect. Her behavior is normal. Judgment and  thought content normal.    Assessment and Plan  1. Hyperlipidemia UTD labs (07/2012).   Repeat in 6 months.  2. Asthma With persistent SOB.  Will request records from Dr. Meredeth Ide.  3. Obstructive sleep apnea on CPAP Will request records.  4. Vitamin D deficiency Labs stable in 07/2012.  5. Hypothyroidism Euthyroid.  6. Left knee pain Due to OA.  See below.  7. Osteoporosis, unspecified Followed by Dr. Ernest Pine.  May need another TKR.

## 2012-10-07 NOTE — Patient Instructions (Addendum)
It was great to meet you. Please come see me in September for a follow up (30 minute appointment).  I will review your records before then.

## 2012-10-14 DIAGNOSIS — Z96659 Presence of unspecified artificial knee joint: Secondary | ICD-10-CM | POA: Diagnosis not present

## 2012-10-14 DIAGNOSIS — M6580 Other synovitis and tenosynovitis, unspecified site: Secondary | ICD-10-CM | POA: Diagnosis not present

## 2012-11-03 ENCOUNTER — Telehealth: Payer: Self-pay

## 2012-11-03 NOTE — Telephone Encounter (Signed)
Yes she can both at the same time.

## 2012-11-03 NOTE — Telephone Encounter (Signed)
Pt left v/m; pt has f/u appt and prolia injection already scheduled on 11/10/12 and pt wants to know if can get flu vaccine same day as when gets Prolia injection.Please advise.

## 2012-11-04 NOTE — Telephone Encounter (Signed)
Left message on voice mail advising yes, this can be done.

## 2012-11-10 ENCOUNTER — Encounter: Payer: Self-pay | Admitting: Family Medicine

## 2012-11-10 ENCOUNTER — Ambulatory Visit (INDEPENDENT_AMBULATORY_CARE_PROVIDER_SITE_OTHER): Payer: Medicare Other | Admitting: Family Medicine

## 2012-11-10 VITALS — BP 110/58 | HR 82 | Temp 97.5°F | Ht 66.5 in

## 2012-11-10 DIAGNOSIS — E039 Hypothyroidism, unspecified: Secondary | ICD-10-CM

## 2012-11-10 DIAGNOSIS — E559 Vitamin D deficiency, unspecified: Secondary | ICD-10-CM

## 2012-11-10 DIAGNOSIS — M810 Age-related osteoporosis without current pathological fracture: Secondary | ICD-10-CM

## 2012-11-10 DIAGNOSIS — E785 Hyperlipidemia, unspecified: Secondary | ICD-10-CM

## 2012-11-10 DIAGNOSIS — Z23 Encounter for immunization: Secondary | ICD-10-CM | POA: Diagnosis not present

## 2012-11-10 DIAGNOSIS — G4733 Obstructive sleep apnea (adult) (pediatric): Secondary | ICD-10-CM

## 2012-11-10 MED ORDER — DENOSUMAB 60 MG/ML ~~LOC~~ SOLN
60.0000 mg | Freq: Once | SUBCUTANEOUS | Status: AC
  Administered 2012-11-10: 60 mg via SUBCUTANEOUS

## 2012-11-10 NOTE — Addendum Note (Signed)
Addended by: Janalyn Harder on: 11/10/2012 02:13 PM   Modules accepted: Orders

## 2012-11-10 NOTE — Progress Notes (Signed)
Patient ID: Dorothy Church, female    DOB: [birth year], 77 y.o.   MRN: [id number]  HPI Comments:  Very pleasant 77 yo female with a history of asthma, obstructive sleep apnea who wears CPAP for the past 3 years, hyperlipidemia, OA, osteoporosis here for follow up.  Established care with me last month.  Was seeing Dr. Delfin Edis whom has since left her practice.    OSA- She has been wearing CPAP for past 3 years.  This has not helped with her fatigue or SOB dramatically.  In fact, she thinks that the CPAP is not fitting properly and does not get call backs from the CPAP company, [employer].   Pulmonologist is Dr. Meredeth Ide.  TSH normal in 07/2012.  Osteoporosis- UTD DEXA (almost 2 years ago).  She is receiving prolia.  Last injection 04/2012.  Due for repeat injection today.  She would also like her flu shot today.  Patient Active Problem List   Diagnosis Date Noted  . Left knee pain 10/07/2012  . Osteoporosis, unspecified 10/07/2012  . Vitamin D deficiency   . Hypothyroidism   . SOB (shortness of breath) 08/07/2012  . Fatigue 08/07/2012  . Dizziness 08/07/2012  . Hyperlipidemia 08/07/2012  . Obstructive sleep apnea on CPAP 08/07/2012  . Asthma 08/07/2012   Past Medical History  Diagnosis Date  . Intrinsic asthma, unspecified   . Aortic valve disorders   . Obstructive sleep apnea (adult) (pediatric)   . Vitamin D deficiency   . Barrett's esophagus   . Impaired fasting glucose   . COPD (chronic obstructive pulmonary disease)   . Syncope and collapse   . Hypothyroidism   . Osteoporosis    Past Surgical History  Procedure Laterality Date  . Replacement unicondylar joint knee    . Hip surgery    . Colonoscopy    . Eye surgery    . Prosthesis eye implant    . Tonsillectomy     History  Substance Use Topics  . Smoking status: Never Smoker   . Smokeless tobacco: Not on file  . Alcohol Use: Yes     Comment: occas. wine   Family History  Problem Relation Age of Onset  .  Heart disease Father   . Bipolar disorder Brother    Allergies  Allergen Reactions  . Amoxicillin   . Cefzil [Cefprozil]   . Cephalexin   . Relafen [Nabumetone]   . Talwin [Pentazocine]    Current Outpatient Prescriptions on File Prior to Visit  Medication Sig Dispense Refill  . albuterol (VENTOLIN HFA) 108 (90 BASE) MCG/ACT inhaler Inhale 2 puffs into the lungs every 6 (six) hours as needed for wheezing.      Marland Kitchen aspirin 81 MG tablet Take 81 mg by mouth daily.      . budesonide-formoterol (SYMBICORT) 80-4.5 MCG/ACT inhaler Inhale 2 puffs into the lungs 2 (two) times daily.      . calcium carbonate (OS-CAL) 600 MG TABS Take 600 mg by mouth daily.      Marland Kitchen denosumab (PROLIA) 60 MG/ML SOLN injection Inject 60 mg into the skin every 6 (six) months. Administer in upper arm, thigh, or abdomen      . esomeprazole (NEXIUM) 40 MG capsule Take 40 mg by mouth daily before breakfast.      . levothyroxine (SYNTHROID, LEVOTHROID) 50 MCG tablet Take 50 mcg by mouth daily before breakfast.      . Multiple Vitamin (MULTIVITAMIN) tablet Take 1 tablet by mouth daily.      Marland Kitchen  PARoxetine (PAXIL) 30 MG tablet Take 30 mg by mouth every morning.      . simvastatin (ZOCOR) 40 MG tablet Take 40 mg by mouth every evening.       No current facility-administered medications on file prior to visit.   The PMH, PSH, Social History, Family History, Medications, and allergies have been reviewed in [facility], and have been updated if relevant.     Outpatient Encounter Prescriptions as of 08/07/2012  Medication Sig Dispense Refill  . albuterol (VENTOLIN HFA) 108 (90 BASE) MCG/ACT inhaler Inhale 2 puffs into the lungs every 6 (six) hours as needed for wheezing.      Marland Kitchen aspirin 81 MG tablet Take 81 mg by mouth daily.      . budesonide-formoterol (SYMBICORT) 80-4.5 MCG/ACT inhaler Inhale 2 puffs into the lungs 2 (two) times daily.      . calcium carbonate (OS-CAL) 600 MG TABS Take 600 mg by mouth daily.      Marland Kitchen denosumab (PROLIA)  60 MG/ML SOLN injection Inject 60 mg into the skin every 6 (six) months. Administer in upper arm, thigh, or abdomen      . esomeprazole (NEXIUM) 40 MG capsule Take 40 mg by mouth daily before breakfast.      . levothyroxine (SYNTHROID, LEVOTHROID) 50 MCG tablet Take 50 mcg by mouth daily before breakfast.      . Multiple Vitamin (MULTIVITAMIN) tablet Take 1 tablet by mouth daily.      Marland Kitchen PARoxetine (PAXIL) 30 MG tablet Take 30 mg by mouth every morning.      . simvastatin (ZOCOR) 40 MG tablet Take 40 mg by mouth every evening.        Review of Systems  Constitutional: Positive for fatigue.  HENT: Negative.   Eyes: Negative.   Respiratory: Positive for shortness of breath.   Cardiovascular: Negative.   Gastrointestinal: Negative.   Musculoskeletal: Negative.   Skin: Negative.   Neurological: Negative.   Psychiatric/Behavioral: Negative.   All other systems reviewed and are negative.    Ht 5' 6.5" (1.689 m)   Physical Exam  BP 110/58  Pulse 82  Temp(Src) 97.5 F (36.4 C) (Oral)  Ht 5' 6.5" (1.689 m)  Constitutional: She is oriented to person, place, and time. She appears well-developed and well-nourished.  HENT:  Head: Normocephalic.  Nose: Nose normal.  Mouth/Throat: Oropharynx is clear and moist.  Eyes: Conjunctivae are normal. Pupils are equal, round, and reactive to light.  Neck: Normal range of motion. Neck supple. No JVD present.  Cardiovascular: Normal rate, regular rhythm, S1 normal, S2 normal, normal heart sounds and intact distal pulses.  Exam reveals no gallop and no friction rub.   No murmur heard. Pulmonary/Chest: Effort normal and breath sounds normal. No respiratory distress. She has no wheezes. She has no rales. She exhibits no tenderness.  Abdominal: Soft. Bowel sounds are normal. She exhibits no distension. There is no tenderness.  Musculoskeletal: Normal range of motion. She exhibits no edema and no tenderness.  Lymphadenopathy:    She has no cervical  adenopathy.  Neurological: She is alert and oriented to person, place, and time. Coordination normal.  Skin: Skin is warm and dry. No rash noted. No erythema.  Psychiatric: She has a normal mood and affect. Her behavior is normal. Judgment and thought content normal.    Assessment and Plan   1. Osteoporosis, unspecified Prolia given today. Order repeat bone denisty. - DG Bone Density; Future  2. Hyperlipidemia Well controlled.  No changes.  3. Hypothyroidism Stable on current dose of synthroid.  No changes.  4. Obstructive sleep apnea on CPAP Advised to call Dr. Meredeth Ide as soon as possible so he can review the data and fit of her CPAP machine. The patient indicates understanding of these issues and agrees with the plan.

## 2012-11-10 NOTE — Patient Instructions (Addendum)
Good to see you. We will call you with your bone density scan appointment.  Please call Dr. Verdie Shire office to let them know that you are having issues with [employer] and your CPAP machine.

## 2012-11-10 NOTE — Addendum Note (Signed)
Addended by: Janalyn Harder on: 11/10/2012 11:22 AM   Modules accepted: Orders

## 2012-11-13 ENCOUNTER — Ambulatory Visit: Payer: Self-pay | Admitting: Family Medicine

## 2012-11-14 DIAGNOSIS — J301 Allergic rhinitis due to pollen: Secondary | ICD-10-CM | POA: Diagnosis not present

## 2012-11-14 DIAGNOSIS — G4733 Obstructive sleep apnea (adult) (pediatric): Secondary | ICD-10-CM | POA: Diagnosis not present

## 2012-11-14 DIAGNOSIS — J342 Deviated nasal septum: Secondary | ICD-10-CM | POA: Diagnosis not present

## 2012-11-18 DIAGNOSIS — Z96659 Presence of unspecified artificial knee joint: Secondary | ICD-10-CM | POA: Diagnosis not present

## 2013-01-06 DIAGNOSIS — H40009 Preglaucoma, unspecified, unspecified eye: Secondary | ICD-10-CM | POA: Diagnosis not present

## 2013-01-30 ENCOUNTER — Ambulatory Visit (INDEPENDENT_AMBULATORY_CARE_PROVIDER_SITE_OTHER): Payer: Medicare Other | Admitting: Family Medicine

## 2013-01-30 ENCOUNTER — Encounter: Payer: Self-pay | Admitting: Family Medicine

## 2013-01-30 VITALS — BP 114/62 | HR 76 | Temp 97.5°F | Ht 66.5 in | Wt 138.0 lb

## 2013-01-30 DIAGNOSIS — H6980 Other specified disorders of Eustachian tube, unspecified ear: Secondary | ICD-10-CM

## 2013-01-30 DIAGNOSIS — H6990 Unspecified Eustachian tube disorder, unspecified ear: Secondary | ICD-10-CM

## 2013-01-30 NOTE — Patient Instructions (Signed)
Try using nasal saline and avoid afrin.   Use nasonex daily.  Gently try to pop your ears.   Take care.

## 2013-01-30 NOTE — Progress Notes (Signed)
Pre-visit discussion using our clinic review tool. No additional management support is needed unless otherwise documented below in the visit note.  Recent done with clinda tx after a tooth extraction, last dose yesterday.  The extraction went w/o complication.   Sx started about 1 week ago.  First noted fatigue. She has been diffusely but not focally weak.  No fevers.  No ear pain but they feel full.  Some sneezing, not atypical.  No facial pain.  No cough.  No sputum.  No ST.    Sleeps with CPAP.    Meds, vitals, and allergies reviewed.   ROS: See HPI.  Otherwise, noncontributory.  GEN: nad, alert and oriented HEENT: mucous membranes moist, tm w/o erythema, nasal exam w/o erythema, clear discharge noted,  OP with cobblestoning, slow pressure wave at the TM with valsalva NECK: supple w/o LA CV: rrr.   PULM: ctab, no inc wob EXT: no edema SKIN: no acute rash

## 2013-01-30 NOTE — Assessment & Plan Note (Signed)
Likely not infectious.  Max/frontal sinuses not ttp.  D/w pt.  Likely ETD.  Anatomy d/w pt.  See instructions.  F/u prn.

## 2013-02-26 ENCOUNTER — Other Ambulatory Visit: Payer: Self-pay | Admitting: *Deleted

## 2013-02-26 MED ORDER — LEVOTHYROXINE SODIUM 50 MCG PO TABS: 50.0000 ug | ORAL_TABLET | Freq: Every day | ORAL | Status: DC

## 2013-02-26 MED ORDER — PAROXETINE HCL 30 MG PO TABS: 30.0000 mg | ORAL_TABLET | ORAL | Status: DC

## 2013-02-26 MED ORDER — SIMVASTATIN 40 MG PO TABS: 40.0000 mg | ORAL_TABLET | Freq: Every evening | ORAL | Status: DC

## 2013-03-03 DIAGNOSIS — J301 Allergic rhinitis due to pollen: Secondary | ICD-10-CM | POA: Diagnosis not present

## 2013-03-03 DIAGNOSIS — J342 Deviated nasal septum: Secondary | ICD-10-CM | POA: Diagnosis not present

## 2013-03-03 DIAGNOSIS — G4733 Obstructive sleep apnea (adult) (pediatric): Secondary | ICD-10-CM | POA: Diagnosis not present

## 2013-03-04 ENCOUNTER — Telehealth: Payer: Self-pay | Admitting: Family Medicine

## 2013-03-04 NOTE — Telephone Encounter (Signed)
Dorothy Church called regarding pt's recent Levothyroxine refill.  RX sent it was for Levothroid but pt usually gets generic form from another manufacturer.  [place] Law requires them to get permission to change manufacturers.  Cb [id number]; ref #- [id number].

## 2013-03-04 NOTE — Telephone Encounter (Signed)
Ok to change as requested

## 2013-03-04 NOTE — Telephone Encounter (Signed)
Spoke to [place] at [facility] and informed her that per Dr Deborra Medina, they have permission to change manufacturers, if needed.

## 2013-04-23 ENCOUNTER — Encounter: Payer: Self-pay | Admitting: Family Medicine

## 2013-04-23 ENCOUNTER — Ambulatory Visit (INDEPENDENT_AMBULATORY_CARE_PROVIDER_SITE_OTHER): Payer: Medicare Other | Admitting: Family Medicine

## 2013-04-23 VITALS — BP 122/68 | HR 75 | Temp 97.8°F | Ht 66.25 in | Wt 141.5 lb

## 2013-04-23 DIAGNOSIS — M8580 Other specified disorders of bone density and structure, unspecified site: Secondary | ICD-10-CM

## 2013-04-23 DIAGNOSIS — Z1231 Encounter for screening mammogram for malignant neoplasm of breast: Secondary | ICD-10-CM

## 2013-04-23 DIAGNOSIS — Z0000 Encounter for general adult medical examination without abnormal findings: Secondary | ICD-10-CM | POA: Insufficient documentation

## 2013-04-23 DIAGNOSIS — R5383 Other fatigue: Secondary | ICD-10-CM | POA: Diagnosis not present

## 2013-04-23 DIAGNOSIS — E785 Hyperlipidemia, unspecified: Secondary | ICD-10-CM | POA: Diagnosis not present

## 2013-04-23 DIAGNOSIS — Z79899 Other long term (current) drug therapy: Secondary | ICD-10-CM | POA: Insufficient documentation

## 2013-04-23 DIAGNOSIS — G4710 Hypersomnia, unspecified: Secondary | ICD-10-CM

## 2013-04-23 DIAGNOSIS — Z23 Encounter for immunization: Secondary | ICD-10-CM | POA: Diagnosis not present

## 2013-04-23 DIAGNOSIS — R5381 Other malaise: Secondary | ICD-10-CM | POA: Diagnosis not present

## 2013-04-23 DIAGNOSIS — E559 Vitamin D deficiency, unspecified: Secondary | ICD-10-CM

## 2013-04-23 DIAGNOSIS — E039 Hypothyroidism, unspecified: Secondary | ICD-10-CM

## 2013-04-23 DIAGNOSIS — M810 Age-related osteoporosis without current pathological fracture: Secondary | ICD-10-CM

## 2013-04-23 DIAGNOSIS — G4730 Sleep apnea, unspecified: Secondary | ICD-10-CM

## 2013-04-23 LAB — COMPREHENSIVE METABOLIC PANEL
ALBUMIN: 3.8 g/dL (ref 3.5–5.2)
ALK PHOS: 32 U/L — AB (ref 39–117)
ALT: 14 U/L (ref 0–35)
AST: 17 U/L (ref 0–37)
BILIRUBIN TOTAL: 0.7 mg/dL (ref 0.3–1.2)
BUN: 14 mg/dL (ref 6–23)
CO2: 28 meq/L (ref 19–32)
Calcium: 8.6 mg/dL (ref 8.4–10.5)
Chloride: 105 mEq/L (ref 96–112)
Creatinine, Ser: 0.7 mg/dL (ref 0.4–1.2)
GFR: 82.52 mL/min (ref >60.00)
Glucose, Bld: 87 mg/dL (ref 70–99)
Potassium: 3.9 mEq/L (ref 3.5–5.1)
SODIUM: 139 meq/L (ref 135–145)
TOTAL PROTEIN: 6.3 g/dL (ref 6.0–8.3)

## 2013-04-23 LAB — CBC WITH DIFFERENTIAL/PLATELET
BASOS ABS: 0 10*3/uL (ref 0.0–0.1)
Basophils Relative: 0.6 % (ref 0.0–3.0)
EOS ABS: 0.2 10*3/uL (ref 0.0–0.7)
Eosinophils Relative: 5.9 % — ABNORMAL HIGH (ref 0.0–5.0)
HCT: 39.7 % (ref 36.0–46.0)
Hemoglobin: 13.3 g/dL (ref 12.0–15.0)
LYMPHS ABS: 1.2 10*3/uL (ref 0.7–4.0)
Lymphocytes Relative: 28.7 % (ref 12.0–46.0)
MCHC: 33.5 g/dL (ref 30.0–36.0)
MCV: 91.7 fl (ref 78.0–100.0)
Monocytes Absolute: 0.3 10*3/uL (ref 0.1–1.0)
Monocytes Relative: 8.1 % (ref 3.0–12.0)
NEUTROS ABS: 2.3 10*3/uL (ref 1.4–7.7)
Neutrophils Relative %: 56.7 % (ref 43.0–77.0)
Platelets: 209 10*3/uL (ref 150.0–400.0)
RBC: 4.33 Mil/uL (ref 3.87–5.11)
RDW: 13.1 % (ref 11.5–14.6)
WBC: 4.1 10*3/uL — ABNORMAL LOW (ref 4.5–10.5)

## 2013-04-23 LAB — LIPID PANEL
Cholesterol: 149 mg/dL (ref 0–200)
HDL: 55.5 mg/dL (ref >39.00)
LDL CALC: 78 mg/dL (ref 0–99)
Total CHOL/HDL Ratio: 3
Triglycerides: 77 mg/dL (ref 0.0–149.0)
VLDL: 15.4 mg/dL (ref 0.0–40.0)

## 2013-04-23 LAB — T4, FREE: Free T4: 0.94 ng/dL (ref 0.60–1.60)

## 2013-04-23 LAB — TSH: TSH: 0.94 u[IU]/mL (ref 0.35–5.50)

## 2013-04-23 NOTE — Assessment & Plan Note (Signed)
No changes. Recheck labs today. 

## 2013-04-23 NOTE — Assessment & Plan Note (Signed)
See above

## 2013-04-23 NOTE — Assessment & Plan Note (Signed)
Refer for bone density. Reassess need for prolia after bone density results given increased dental issues.

## 2013-04-23 NOTE — Assessment & Plan Note (Signed)
The patients weight, height, BMI and visual acuity have been recorded in the chart I have made referrals, counseling and provided education to the patient based review of the above and I have provided the pt with a written personalized care plan for preventive services.  

## 2013-04-23 NOTE — Assessment & Plan Note (Signed)
No changes. Recheck labs today.

## 2013-04-23 NOTE — Patient Instructions (Addendum)
Please stop by to see Rosaria Ferries on your way out to set up your bone density and mammogram.  We will call you with your lab results and you can view them online.  Thank you so much for my gifts!

## 2013-04-23 NOTE — Assessment & Plan Note (Signed)
Persistent but not worsening. Will check labs today. CPAP is fitting better now- advised to continue wearing this nightly.

## 2013-04-23 NOTE — Progress Notes (Signed)
Patient ID: Dorothy Church, female    DOB: [birth year], 78 y.o.   MRN: [id number]  HPI Comments:  Very pleasant 78 yo female with a history of asthma, obstructive sleep apnea who wears CPAP, hyperlipidemia, OA, osteoporosis here for medicare wellness visit.  I have personally reviewed the Medicare Annual Wellness questionnaire and have noted 1. The patient's medical and social history 2. Their use of alcohol, tobacco or illicit drugs 3. Their current medications and supplements 4. The patient's functional ability including ADL's, fall risks, home safety risks and hearing or visual             impairment. 5. Diet and physical activities 6. Evidence for depression or mood disorders End of life wishes discussed and updated in Social History.  Mammogram 06/26/2011 Zostavax 05/08/2007 Pneumovax 10/07/2009 Influenza 11/10/12 Colonoscopy 11/2008- 5 year recall due to polyps DEXA 2011  No h/o post menopausal bleeding (early menopause in 10s).  OSA- Wears a CPAP. This has not helped with her fatigue or SOB dramatically. Pulmonologist is Dr. Raul Del.  Feels she could sleep all day- hypersomnia.  Osteoporosis- Due for DEXA.  She is receiving prolia.  She will be due for repeat injection soon.  Has had a lot of dental problems and wonders if it is related to prolia- several teeth have broken.  Patient Active Problem List   Diagnosis Date Noted  . Medicare annual wellness visit, subsequent 04/23/2013  . ETD (eustachian tube dysfunction) 01/30/2013  . Left knee pain 10/07/2012  . Osteoporosis, unspecified 10/07/2012  . Vitamin D deficiency   . Hypothyroidism   . SOB (shortness of breath) 08/07/2012  . Fatigue 08/07/2012  . Dizziness 08/07/2012  . Hyperlipidemia 08/07/2012  . Obstructive sleep apnea on CPAP 08/07/2012  . Asthma 08/07/2012   Past Medical History  Diagnosis Date  . Intrinsic asthma, unspecified   . Aortic valve disorders   . Obstructive sleep apnea (adult) (pediatric)    . Vitamin D deficiency   . Barrett's esophagus   . Impaired fasting glucose   . COPD (chronic obstructive pulmonary disease)   . Syncope and collapse   . Hypothyroidism   . Osteoporosis    Past Surgical History  Procedure Laterality Date  . Replacement unicondylar joint knee    . Hip surgery    . Colonoscopy    . Eye surgery    . Prosthesis eye implant    . Tonsillectomy     History  Substance Use Topics  . Smoking status: Never Smoker   . Smokeless tobacco: Never Used  . Alcohol Use: Yes     Comment: occas. wine   Family History  Problem Relation Age of Onset  . Heart disease Father   . Bipolar disorder Brother    Allergies  Allergen Reactions  . Amoxicillin   . Cefzil [Cefprozil]   . Cephalexin   . Relafen [Nabumetone]   . Talwin [Pentazocine]    Current Outpatient Prescriptions on File Prior to Visit  Medication Sig Dispense Refill  . albuterol (VENTOLIN HFA) 108 (90 BASE) MCG/ACT inhaler Inhale 2 puffs into the lungs every 6 (six) hours as needed for wheezing.      Marland Kitchen aspirin 81 MG tablet Take 81 mg by mouth daily.      . budesonide-formoterol (SYMBICORT) 80-4.5 MCG/ACT inhaler Inhale 2 puffs into the lungs 2 (two) times daily.      . calcium carbonate (OS-CAL) 600 MG TABS Take 600 mg by mouth daily.      Marland Kitchen  denosumab (PROLIA) 60 MG/ML SOLN injection Inject 60 mg into the skin every 6 (six) months. Administer in upper arm, thigh, or abdomen      . esomeprazole (NEXIUM) 40 MG capsule Take 40 mg by mouth daily before breakfast.      . levothyroxine (SYNTHROID, LEVOTHROID) 50 MCG tablet Take 1 tablet (50 mcg total) by mouth daily before breakfast.  90 tablet  1  . Multiple Vitamin (MULTIVITAMIN) tablet Take 1 tablet by mouth daily.      Marland Kitchen PARoxetine (PAXIL) 30 MG tablet Take 1 tablet (30 mg total) by mouth every morning.  90 tablet  1  . simvastatin (ZOCOR) 40 MG tablet Take 1 tablet (40 mg total) by mouth every evening.  90 tablet  1   No current  facility-administered medications on file prior to visit.   The PMH, PSH, Social History, Family History, Medications, and allergies have been reviewed in [facility], and have been updated if relevant.     Outpatient Encounter Prescriptions as of 08/07/2012  Medication Sig Dispense Refill  . albuterol (VENTOLIN HFA) 108 (90 BASE) MCG/ACT inhaler Inhale 2 puffs into the lungs every 6 (six) hours as needed for wheezing.      Marland Kitchen aspirin 81 MG tablet Take 81 mg by mouth daily.      . budesonide-formoterol (SYMBICORT) 80-4.5 MCG/ACT inhaler Inhale 2 puffs into the lungs 2 (two) times daily.      . calcium carbonate (OS-CAL) 600 MG TABS Take 600 mg by mouth daily.      Marland Kitchen denosumab (PROLIA) 60 MG/ML SOLN injection Inject 60 mg into the skin every 6 (six) months. Administer in upper arm, thigh, or abdomen      . esomeprazole (NEXIUM) 40 MG capsule Take 40 mg by mouth daily before breakfast.      . levothyroxine (SYNTHROID, LEVOTHROID) 50 MCG tablet Take 50 mcg by mouth daily before breakfast.      . Multiple Vitamin (MULTIVITAMIN) tablet Take 1 tablet by mouth daily.      Marland Kitchen PARoxetine (PAXIL) 30 MG tablet Take 30 mg by mouth every morning.      . simvastatin (ZOCOR) 40 MG tablet Take 40 mg by mouth every evening.        Review of Systems  Constitutional: Positive for fatigue.  HENT: Negative.   Eyes: Negative.   Respiratory: Positive for shortness of breath.   Cardiovascular: Negative.   Gastrointestinal: Negative.   Musculoskeletal: Negative.   Skin: Negative.   Neurological: Negative.   Psychiatric/Behavioral: Negative.   All other systems reviewed and are negative.    BP 122/68  Pulse 75  Temp(Src) 97.8 F (36.6 C) (Oral)  Ht 5' 6.25" (1.683 m)  Wt 141 lb 8 oz (64.184 kg)  BMI 22.66 kg/m2  SpO2 95%   Physical Exam  BP 122/68  Pulse 75  Temp(Src) 97.8 F (36.6 C) (Oral)  Ht 5' 6.25" (1.683 m)  Wt 141 lb 8 oz (64.184 kg)  BMI 22.66 kg/m2  SpO2 95%  Constitutional: She is  oriented to person, place, and time. She appears well-developed and well-nourished.  HENT:  Head: Normocephalic.  Nose: Nose normal.  Mouth/Throat: Oropharynx is clear and moist.  Eyes: Conjunctivae are normal. Pupils are equal, round, and reactive to light.  Neck: Normal range of motion. Neck supple. No JVD present.  Cardiovascular: Normal rate, regular rhythm, S1 normal, S2 normal, normal heart sounds and intact distal pulses.  Exam reveals no gallop and no friction rub.  No murmur heard. Pulmonary/Chest: Effort normal and breath sounds normal. No respiratory distress. She has no wheezes. She has no rales. She exhibits no tenderness.  Abdominal: Soft. Bowel sounds are normal. She exhibits no distension. There is no tenderness.  Musculoskeletal: Normal range of motion. She exhibits no edema and no tenderness.  Lymphadenopathy:    She has no cervical adenopathy.  Neurological: She is alert and oriented to person, place, and time. Coordination normal.  Skin: Skin is warm and dry. No rash noted. No erythema.  Psychiatric: She has a normal mood and affect. Her behavior is normal. Judgment and thought content normal.    Assessment and Plan

## 2013-04-23 NOTE — Progress Notes (Signed)
Pre visit review using our clinic review tool, if applicable. No additional management support is needed unless otherwise documented below in the visit note. 

## 2013-04-24 LAB — VITAMIN D 25 HYDROXY (VIT D DEFICIENCY, FRACTURES): Vit D, 25-Hydroxy: 35 ng/mL (ref 30–89)

## 2013-04-27 NOTE — Addendum Note (Signed)
Addended by: Modena Nunnery on: 04/27/2013 03:31 PM   Modules accepted: Orders

## 2013-04-28 ENCOUNTER — Encounter: Payer: Self-pay | Admitting: Family Medicine

## 2013-04-28 NOTE — Telephone Encounter (Signed)
mychart message sent to pt indicating we completed a CMP and glucose level pf 87 provided.

## 2013-05-19 ENCOUNTER — Ambulatory Visit: Payer: Self-pay | Admitting: Family Medicine

## 2013-05-19 ENCOUNTER — Encounter: Payer: Self-pay | Admitting: Family Medicine

## 2013-05-19 DIAGNOSIS — M545 Low back pain, unspecified: Secondary | ICD-10-CM | POA: Diagnosis not present

## 2013-05-19 DIAGNOSIS — M949 Disorder of cartilage, unspecified: Secondary | ICD-10-CM | POA: Diagnosis not present

## 2013-05-19 DIAGNOSIS — M899 Disorder of bone, unspecified: Secondary | ICD-10-CM | POA: Diagnosis not present

## 2013-05-19 DIAGNOSIS — E28319 Asymptomatic premature menopause: Secondary | ICD-10-CM | POA: Diagnosis not present

## 2013-05-19 DIAGNOSIS — M810 Age-related osteoporosis without current pathological fracture: Secondary | ICD-10-CM | POA: Diagnosis not present

## 2013-05-19 DIAGNOSIS — Z1231 Encounter for screening mammogram for malignant neoplasm of breast: Secondary | ICD-10-CM | POA: Diagnosis not present

## 2013-05-19 DIAGNOSIS — Z13820 Encounter for screening for osteoporosis: Secondary | ICD-10-CM | POA: Diagnosis not present

## 2013-05-20 ENCOUNTER — Encounter: Payer: Self-pay | Admitting: Family Medicine

## 2013-05-26 ENCOUNTER — Telehealth: Payer: Self-pay

## 2013-05-26 NOTE — Telephone Encounter (Signed)
Lm on pts vm requesting a call back. Pt will need to personally contact her insurance company to verify coverage. The paperwork that we submit is directly to Prol[employer], not her insurance co

## 2013-05-26 NOTE — Telephone Encounter (Signed)
Pt left v/m; pt gets Prolia injection q 6 months. Pt request to check with ins co to verify they will pay for Prolia injection. Pt request cb.

## 2013-06-02 ENCOUNTER — Other Ambulatory Visit: Payer: Self-pay | Admitting: Family Medicine

## 2013-06-02 MED ORDER — BUDESONIDE-FORMOTEROL FUMARATE 80-4.5 MCG/ACT IN AERO: 2.0000 | INHALATION_SPRAY | Freq: Two times a day (BID) | RESPIRATORY_TRACT | Status: DC

## 2013-06-02 NOTE — Telephone Encounter (Signed)
Previously prescribed by Dr. Jalene Mullet who closed her practice, pt requesting 3 month supply.

## 2013-06-14 ENCOUNTER — Encounter: Payer: Self-pay | Admitting: Family Medicine

## 2013-06-15 MED ORDER — BUDESONIDE-FORMOTEROL FUMARATE 160-4.5 MCG/ACT IN AERO: 2.0000 | INHALATION_SPRAY | Freq: Two times a day (BID) | RESPIRATORY_TRACT | Status: DC

## 2013-06-22 ENCOUNTER — Telehealth: Payer: Self-pay | Admitting: Family Medicine

## 2013-06-22 NOTE — Telephone Encounter (Signed)
Pt came in office to pick up some forms and needed information about her Prolia injection. She is in the middle of some dental work (implant) and needs to know if she should wait til after all the dental work is over to receive the injection?  Approx date for that is around August.   Call back number [phone].

## 2013-06-23 NOTE — Telephone Encounter (Signed)
Left message on answering machine to call back.

## 2013-06-23 NOTE — Telephone Encounter (Signed)
Yes I would wait until after dental work to be on safe side.

## 2013-06-25 NOTE — Telephone Encounter (Signed)
Left detailed message on VM advised pt to call if any questions

## 2013-07-22 ENCOUNTER — Ambulatory Visit (INDEPENDENT_AMBULATORY_CARE_PROVIDER_SITE_OTHER): Payer: Medicare Other | Admitting: Family Medicine

## 2013-07-22 ENCOUNTER — Encounter: Payer: Self-pay | Admitting: Family Medicine

## 2013-07-22 VITALS — BP 112/70 | HR 79 | Temp 98.0°F | Ht 66.25 in | Wt 141.5 lb

## 2013-07-22 DIAGNOSIS — M5412 Radiculopathy, cervical region: Secondary | ICD-10-CM

## 2013-07-22 DIAGNOSIS — M5010 Cervical disc disorder with radiculopathy, unspecified cervical region: Secondary | ICD-10-CM

## 2013-07-22 MED ORDER — TIZANIDINE HCL 2 MG PO TABS: 2.0000 mg | ORAL_TABLET | Freq: Three times a day (TID) | ORAL | Status: DC | PRN

## 2013-07-22 MED ORDER — PREDNISONE 20 MG PO TABS: ORAL_TABLET | ORAL | Status: DC

## 2013-07-22 MED ORDER — LEVOTHYROXINE SODIUM 50 MCG PO TABS: 50.0000 ug | ORAL_TABLET | Freq: Every day | ORAL | Status: DC

## 2013-07-22 NOTE — Progress Notes (Signed)
Pre visit review using our clinic review tool, if applicable. No additional management support is needed unless otherwise documented below in the visit note. 

## 2013-07-22 NOTE — Progress Notes (Signed)
[place] [address] Phone: [phone] Fax: [phone]  Patient ID: Dorothy Church MRN: [id number], DOB: [birth year], 78 y.o. Date of Encounter: 07/22/2013  Primary Physician:  Arnette Norris, MD   Chief Complaint: Shoulder Pain   Subjective:   History of Present Illness:  Dorothy Church is a 78 y.o. very pleasant female patient who presents with the following:  Woke up about ten days ago and had a stiff neck. And pain is in the posterior of the shoulder and down to her fingertips. Has been driving a lot. Has tried some advil, 2 every 6 hours, heat and cold. At this point she is not really having any significant pain with abduction or internal or external range of motion. Her strength is preserved and her shoulder. She does have pain in her posterior neck, and mostly pain in the shoulder blade as well as pain is radiating down all the way to her fingertips in her arm.  R side, LHD.    Past Medical History, Surgical History, Social History, Family History, Problem List, Medications, and Allergies have been reviewed and updated if relevant.  Review of Systems:  GEN: No fevers, chills. Nontoxic. Primarily MSK c/o today. MSK: Detailed in the HPI GI: tolerating PO intake without difficulty Neuro: No numbness, parasthesias, or tingling associated. Otherwise the pertinent positives of the ROS are noted above.   Objective:   Physical Examination: BP 112/70  Pulse 79  Temp(Src) 98 F (36.7 C) (Oral)  Ht 5' 6.25" (1.683 m)  Wt 141 lb 8 oz (64.184 kg)  BMI 22.66 kg/m2   GEN: WDWN, NAD, Non-toxic, Alert & Oriented x 3 HEENT: Atraumatic, Normocephalic.  Ears and Nose: No external deformity. EXTR: No clubbing/cyanosis/edema NEURO: Normal gait.  PSYCH: Normally interactive. Conversant. Not depressed or anxious appearing.  Calm demeanor.   CERVICAL SPINE EXAM Range of motion: Flexion, extension, lateral bending, and rotation: dramatic loss of motion in most  planes of movement with greater than 50 percent loss of motion in all directions. Pain with terminal motion: yes yes Spinous Processes: NT SCM: NT Upper paracervical muscles: relatively diffusely tender to palpation in the upper paracervical muscular region. Upper traps: NT C5-T1 intact, sensation and motor   Shoulder: b Inspection: No muscle wasting or winging Ecchymosis/edema: neg  AC joint, scapula, clavicle: NT Abduction: full, 5/5 Flexion: full, 5/5 IR, full, lift-off: 5/5 ER at neutral: full, 5/5 AC crossover and compression: neg Neer: neg Hawkins: neg Drop Test: neg Empty Can: neg Supraspinatus insertion: NT Bicipital groove: NT Speed's: neg Yergason's: neg Sulcus sign: neg Scapular dyskinesis: none C5-T1 intact Sensation intact Grip 5/5   Radiology: No results found.  Assessment & Plan:   Cervical disc disorder with radiculopathy of cervical region - Plan: Ambulatory referral to Physical Therapy  Classic cervical radiculopathy, which may be secondary to bony osteophyte versus cervical disc. Probable component of degenerative disc disease as well.  I do not think that there is any intra-articular shoulder pathology.  Prednisone burst and taper. Zanaflex at nighttime. Initiate physical therapy at [facility].  The patient is going to [place] in 3 or 4 weeks, and if she is still having some symptoms when she returns, I asked her to followup.  Follow-up: No Follow-up on file. Unless noted above, the patient is to follow-up if symptoms worsen. Red flags were reviewed with the patient.  New Prescriptions   PREDNISONE (DELTASONE) 20 MG TABLET    2 tabs po for 5 days, then 1 tab  po for 3 days   TIZANIDINE (ZANAFLEX) 2 MG TABLET    Take 1 tablet (2 mg total) by mouth every 8 (eight) hours as needed for muscle spasms.   Orders Placed This Encounter  Procedures  . Ambulatory referral to Physical Therapy    Signed,  Frederico Hamman T. Mariusz Jubb, MD, [place]   Patient's Medications  New Prescriptions   PREDNISONE (DELTASONE) 20 MG TABLET    2 tabs po for 5 days, then 1 tab po for 3 days   TIZANIDINE (ZANAFLEX) 2 MG TABLET    Take 1 tablet (2 mg total) by mouth every 8 (eight) hours as needed for muscle spasms.  Previous Medications   ALBUTEROL (VENTOLIN HFA) 108 (90 BASE) MCG/ACT INHALER    Inhale 2 puffs into the lungs every 6 (six) hours as needed for wheezing.   ASPIRIN 81 MG TABLET    Take 81 mg by mouth daily.   BUDESONIDE-FORMOTEROL (SYMBICORT) 160-4.5 MCG/ACT INHALER    Inhale 2 puffs into the lungs 2 (two) times daily.   CALCIUM CARBONATE (OS-CAL) 600 MG TABS    Take 600 mg by mouth daily.   DENOSUMAB (PROLIA) 60 MG/ML SOLN INJECTION    Inject 60 mg into the skin every 6 (six) months. Administer in upper arm, thigh, or abdomen   ESOMEPRAZOLE (NEXIUM) 40 MG CAPSULE    Take 40 mg by mouth daily before breakfast.   MULTIPLE VITAMIN (MULTIVITAMIN) TABLET    Take 1 tablet by mouth daily.   PAROXETINE (PAXIL) 30 MG TABLET    Take 1 tablet (30 mg total) by mouth every morning.   SIMVASTATIN (ZOCOR) 40 MG TABLET    Take 1 tablet (40 mg total) by mouth every evening.  Modified Medications   Modified Medication Previous Medication   LEVOTHYROXINE (SYNTHROID, LEVOTHROID) 50 MCG TABLET levothyroxine (SYNTHROID, LEVOTHROID) 50 MCG tablet      Take 1 tablet (50 mcg total) by mouth daily before breakfast.    Take 1 tablet (50 mcg total) by mouth daily before breakfast.  Discontinued Medications   No medications on file

## 2013-07-27 DIAGNOSIS — M542 Cervicalgia: Secondary | ICD-10-CM | POA: Diagnosis not present

## 2013-07-27 DIAGNOSIS — M6281 Muscle weakness (generalized): Secondary | ICD-10-CM | POA: Diagnosis not present

## 2013-07-29 DIAGNOSIS — M542 Cervicalgia: Secondary | ICD-10-CM | POA: Diagnosis not present

## 2013-07-29 DIAGNOSIS — M6281 Muscle weakness (generalized): Secondary | ICD-10-CM | POA: Diagnosis not present

## 2013-07-31 DIAGNOSIS — M6281 Muscle weakness (generalized): Secondary | ICD-10-CM | POA: Diagnosis not present

## 2013-07-31 DIAGNOSIS — M542 Cervicalgia: Secondary | ICD-10-CM | POA: Diagnosis not present

## 2013-08-03 DIAGNOSIS — M6281 Muscle weakness (generalized): Secondary | ICD-10-CM | POA: Diagnosis not present

## 2013-08-03 DIAGNOSIS — M542 Cervicalgia: Secondary | ICD-10-CM | POA: Diagnosis not present

## 2013-08-04 ENCOUNTER — Telehealth: Payer: Self-pay

## 2013-08-04 NOTE — Telephone Encounter (Signed)
Pt will be traveling out of country 08/19/13 and pt was advised needs script to take cpap with her. Pt uses cpap for sleep apnea and fatigue; was prescribed cpap by physician in [place] 4 years ago. Pt has seen Dr Raul Del the pulmonologist but not about her cpap. Pt request script that pt uses cpap for sleep apnea and fatiuge. Call when ready for pick up.

## 2013-08-05 ENCOUNTER — Ambulatory Visit (INDEPENDENT_AMBULATORY_CARE_PROVIDER_SITE_OTHER): Payer: Medicare Other | Admitting: Family Medicine

## 2013-08-05 ENCOUNTER — Encounter: Payer: Self-pay | Admitting: Family Medicine

## 2013-08-05 ENCOUNTER — Ambulatory Visit (INDEPENDENT_AMBULATORY_CARE_PROVIDER_SITE_OTHER)
Admission: RE | Admit: 2013-08-05 | Discharge: 2013-08-05 | Disposition: A | Discharge status: Home / Self Care | Payer: Medicare Other | Source: Ambulatory Visit | Attending: Family Medicine | Admitting: Family Medicine

## 2013-08-05 VITALS — BP 122/80 | HR 84 | Temp 98.1°F | Wt 143.0 lb

## 2013-08-05 DIAGNOSIS — M5030 Other cervical disc degeneration, unspecified cervical region: Secondary | ICD-10-CM | POA: Diagnosis not present

## 2013-08-05 DIAGNOSIS — M542 Cervicalgia: Secondary | ICD-10-CM | POA: Diagnosis not present

## 2013-08-05 DIAGNOSIS — M5412 Radiculopathy, cervical region: Secondary | ICD-10-CM

## 2013-08-05 DIAGNOSIS — M6281 Muscle weakness (generalized): Secondary | ICD-10-CM | POA: Diagnosis not present

## 2013-08-05 MED ORDER — PREDNISONE 20 MG PO TABS: ORAL_TABLET | ORAL | Status: DC

## 2013-08-05 MED ORDER — TRAMADOL HCL 50 MG PO TABS: 25.0000 mg | ORAL_TABLET | Freq: Two times a day (BID) | ORAL | Status: DC | PRN

## 2013-08-05 NOTE — Telephone Encounter (Signed)
order written on my script and handed to patient today at her office visit.

## 2013-08-05 NOTE — Patient Instructions (Addendum)
xrays today - significant arthritis. Repeat prednisone taper today. Then may start aleve 1 tablet twice daily with food for 5 days for inflammation in neck. May use tramadol for breakthrough pain - may make you sleepy. I'd like to order MRI to better evaluate neck pain. Have a safe trip!

## 2013-08-05 NOTE — Assessment & Plan Note (Addendum)
Check xrays today - significant arthritis, ?foraminal narrowing lower cervical vertebrae. Given neurological change of diminished reflexes and mild RUE weakness, will order cervical neck MRI as well today. Plan will be based on results of MRI - may refer to PM&R for discussion on ESI. In interim, continue aleve OTC prn after completes another prednisone taper and add tramadol for breakthrough pain uncontrolled by aleve.

## 2013-08-05 NOTE — Progress Notes (Signed)
Pre visit review using our clinic review tool, if applicable. No additional management support is needed unless otherwise documented below in the visit note. 

## 2013-08-05 NOTE — Progress Notes (Signed)
BP 122/80  Pulse 84  Temp(Src) 98.1 F (36.7 C) (Oral)  Wt 143 lb (64.864 kg)   CC: f/u neck pain  Subjective:    Patient ID: Dorothy Church, female    DOB: 1936/01/30, 78 y.o.   MRN: [id number]  HPI: Dorothy Church is a 78 y.o. female presenting on 08/05/2013 for Neck Pain   L handed  See prior note for details. Seen here 07/22/2013 by Dr. Lorelei Pont with dx cervical radiculopathy treated with prednisone course, zanaflex at night, and physical therapy at [facility]. This has not helped. PT tried traction today, actually worsened pain. This week in general has been worse than last week. She has also tried aleve which hasn't really helped. She has been using heat which has helped some.  Describes 1 mo h/o stiff neck that started suddenly when she woke up one morning. Worse pain with neck extension, and neck flexion causes shooting pin down R arm.  Endorses some upper R arm pain/pressure as well. Some R arm numbness on right. Rest improves pain.  Denies inciting trauma/injury or falls.  No recent fevers/chills. Denies weakness of R arm.   Upcoming trip to [place] to visit architect son 08/20/2013.  Relevant past medical, surgical, family and social history reviewed and updated as indicated.  Allergies and medications reviewed and updated. Current Outpatient Prescriptions on File Prior to Visit  Medication Sig  . albuterol (VENTOLIN HFA) 108 (90 BASE) MCG/ACT inhaler Inhale 2 puffs into the lungs every 6 (six) hours as needed for wheezing.  Marland Kitchen aspirin 81 MG tablet Take 81 mg by mouth daily.  . budesonide-formoterol (SYMBICORT) 160-4.5 MCG/ACT inhaler Inhale 2 puffs into the lungs 2 (two) times daily.  . calcium carbonate (OS-CAL) 600 MG TABS Take 600 mg by mouth daily.  Marland Kitchen denosumab (PROLIA) 60 MG/ML SOLN injection Inject 60 mg into the skin every 6 (six) months. Administer in upper arm, thigh, or abdomen  . esomeprazole (NEXIUM) 40 MG capsule Take 40 mg by mouth daily before breakfast.  .  levothyroxine (SYNTHROID, LEVOTHROID) 50 MCG tablet Take 1 tablet (50 mcg total) by mouth daily before breakfast.  . Multiple Vitamin (MULTIVITAMIN) tablet Take 1 tablet by mouth daily.  Marland Kitchen PARoxetine (PAXIL) 30 MG tablet Take 1 tablet (30 mg total) by mouth every morning.  . simvastatin (ZOCOR) 40 MG tablet Take 1 tablet (40 mg total) by mouth every evening.  Marland Kitchen tiZANidine (ZANAFLEX) 2 MG tablet Take 1 tablet (2 mg total) by mouth every 8 (eight) hours as needed for muscle spasms.   No current facility-administered medications on file prior to visit.    Review of Systems Per HPI unless specifically indicated above    Objective:    BP 122/80  Pulse 84  Temp(Src) 98.1 F (36.7 C) (Oral)  Wt 143 lb (64.864 kg)  Physical Exam  Nursing note and vitals reviewed. Constitutional: She is oriented to person, place, and time. She appears well-developed and well-nourished. No distress.  Musculoskeletal: She exhibits no edema.  No significant cervical midline spine tenderness Limited ROM 2/2 neck pain worse towards right  Mildly + spurling on right (tingling and discomfort) 2+ rad pulses bilaterally  Neurological: She is alert and oriented to person, place, and time. She has normal strength. No sensory deficit (intact to light touch and temp).  Reflex Scores:      Tricep reflexes are 0 on the right side and 3+ on the left side.      Bicep reflexes are 1+ on  the right side and 3+ on the left side.      Brachioradialis reflexes are 1+ on the right side and 3+ on the left side. 4+/5 strength ext rotation on right 5/5 strength BUE otherwise Grip strength intact       Assessment & Plan:   Problem List Items Addressed This Visit   Right cervical radiculopathy - Primary     Check xrays today - significant arthritis, ?foraminal narrowing lower cervical vertebrae. Given neurological change of diminished reflexes and mild RUE weakness, will order cervical neck MRI as well today. Plan will be  based on results of MRI - may refer to PM&R for discussion on ESI. In interim, continue aleve OTC prn after completes another prednisone taper and add tramadol for breakthrough pain uncontrolled by aleve.    Relevant Orders      DG Cervical Spine Complete      MR Cervical Spine Wo Contrast       Follow up plan: Return if symptoms worsen or fail to improve.

## 2013-08-06 ENCOUNTER — Other Ambulatory Visit: Payer: Self-pay | Admitting: Family Medicine

## 2013-08-06 ENCOUNTER — Ambulatory Visit
Admission: RE | Admit: 2013-08-06 | Discharge: 2013-08-06 | Disposition: A | Discharge status: Home / Self Care | Payer: PRIVATE HEALTH INSURANCE | Source: Ambulatory Visit | Attending: Family Medicine | Admitting: Family Medicine

## 2013-08-06 DIAGNOSIS — M5412 Radiculopathy, cervical region: Secondary | ICD-10-CM

## 2013-08-06 DIAGNOSIS — M5020 Other cervical disc displacement, unspecified cervical region: Secondary | ICD-10-CM | POA: Diagnosis not present

## 2013-08-07 ENCOUNTER — Encounter: Payer: Self-pay | Admitting: Family Medicine

## 2013-08-07 NOTE — Telephone Encounter (Signed)
See Mychart message from patient.

## 2013-08-10 ENCOUNTER — Ambulatory Visit: Payer: Medicare Other | Admitting: Family Medicine

## 2013-08-10 DIAGNOSIS — M6281 Muscle weakness (generalized): Secondary | ICD-10-CM | POA: Diagnosis not present

## 2013-08-10 DIAGNOSIS — M542 Cervicalgia: Secondary | ICD-10-CM | POA: Diagnosis not present

## 2013-08-11 DIAGNOSIS — M542 Cervicalgia: Secondary | ICD-10-CM | POA: Diagnosis not present

## 2013-08-11 DIAGNOSIS — M6281 Muscle weakness (generalized): Secondary | ICD-10-CM | POA: Diagnosis not present

## 2013-08-13 DIAGNOSIS — M542 Cervicalgia: Secondary | ICD-10-CM | POA: Diagnosis not present

## 2013-08-13 DIAGNOSIS — M6281 Muscle weakness (generalized): Secondary | ICD-10-CM | POA: Diagnosis not present

## 2013-08-17 DIAGNOSIS — M542 Cervicalgia: Secondary | ICD-10-CM | POA: Diagnosis not present

## 2013-08-17 DIAGNOSIS — M6281 Muscle weakness (generalized): Secondary | ICD-10-CM | POA: Diagnosis not present

## 2013-08-18 DIAGNOSIS — M6281 Muscle weakness (generalized): Secondary | ICD-10-CM | POA: Diagnosis not present

## 2013-08-18 DIAGNOSIS — M542 Cervicalgia: Secondary | ICD-10-CM | POA: Diagnosis not present

## 2013-08-19 DIAGNOSIS — M47812 Spondylosis without myelopathy or radiculopathy, cervical region: Secondary | ICD-10-CM | POA: Diagnosis not present

## 2013-08-19 DIAGNOSIS — M5412 Radiculopathy, cervical region: Secondary | ICD-10-CM | POA: Diagnosis not present

## 2013-08-20 DIAGNOSIS — M6281 Muscle weakness (generalized): Secondary | ICD-10-CM | POA: Diagnosis not present

## 2013-08-20 DIAGNOSIS — M542 Cervicalgia: Secondary | ICD-10-CM | POA: Diagnosis not present

## 2013-09-07 DIAGNOSIS — M542 Cervicalgia: Secondary | ICD-10-CM | POA: Diagnosis not present

## 2013-09-07 DIAGNOSIS — M6281 Muscle weakness (generalized): Secondary | ICD-10-CM | POA: Diagnosis not present

## 2013-09-09 DIAGNOSIS — M6281 Muscle weakness (generalized): Secondary | ICD-10-CM | POA: Diagnosis not present

## 2013-09-09 DIAGNOSIS — M542 Cervicalgia: Secondary | ICD-10-CM | POA: Diagnosis not present

## 2013-09-11 DIAGNOSIS — M6281 Muscle weakness (generalized): Secondary | ICD-10-CM | POA: Diagnosis not present

## 2013-09-11 DIAGNOSIS — M542 Cervicalgia: Secondary | ICD-10-CM | POA: Diagnosis not present

## 2013-09-15 DIAGNOSIS — M542 Cervicalgia: Secondary | ICD-10-CM | POA: Diagnosis not present

## 2013-09-15 DIAGNOSIS — M6281 Muscle weakness (generalized): Secondary | ICD-10-CM | POA: Diagnosis not present

## 2013-09-25 DIAGNOSIS — M6281 Muscle weakness (generalized): Secondary | ICD-10-CM | POA: Diagnosis not present

## 2013-09-25 DIAGNOSIS — M542 Cervicalgia: Secondary | ICD-10-CM | POA: Diagnosis not present

## 2013-09-28 ENCOUNTER — Other Ambulatory Visit: Payer: Self-pay | Admitting: Family Medicine

## 2013-09-29 DIAGNOSIS — M542 Cervicalgia: Secondary | ICD-10-CM | POA: Diagnosis not present

## 2013-09-29 DIAGNOSIS — M6281 Muscle weakness (generalized): Secondary | ICD-10-CM | POA: Diagnosis not present

## 2013-10-15 ENCOUNTER — Other Ambulatory Visit: Payer: Self-pay | Admitting: Family Medicine

## 2013-10-22 ENCOUNTER — Telehealth: Payer: Self-pay

## 2013-10-22 NOTE — Telephone Encounter (Signed)
Pt finished with dental work last month (implants) pt wants to know how long she should wait before restarting prolia injections. Pt said if can have prolia injection pt wants to make sure will be covered by ins. Co. Pt request cb.

## 2013-10-23 NOTE — Telephone Encounter (Signed)
Lm on pts vm advising per Dr Deborra Medina. Req pt to contact office should she have any additional questions.

## 2013-10-23 NOTE — Telephone Encounter (Signed)
I would recommend waiting 3-6 months to allow her jaw to heal prior to receiving the next injection.

## 2013-11-05 ENCOUNTER — Ambulatory Visit (INDEPENDENT_AMBULATORY_CARE_PROVIDER_SITE_OTHER): Payer: Medicare Other

## 2013-11-05 DIAGNOSIS — Z23 Encounter for immunization: Secondary | ICD-10-CM | POA: Diagnosis not present

## 2013-11-24 ENCOUNTER — Ambulatory Visit (INDEPENDENT_AMBULATORY_CARE_PROVIDER_SITE_OTHER): Payer: Medicare Other | Admitting: Family Medicine

## 2013-11-24 ENCOUNTER — Encounter: Payer: Self-pay | Admitting: Family Medicine

## 2013-11-24 VITALS — BP 116/62 | HR 95 | Temp 98.4°F | Wt 144.5 lb

## 2013-11-24 DIAGNOSIS — J4530 Mild persistent asthma, uncomplicated: Secondary | ICD-10-CM | POA: Diagnosis not present

## 2013-11-24 DIAGNOSIS — Z23 Encounter for immunization: Secondary | ICD-10-CM

## 2013-11-24 DIAGNOSIS — J069 Acute upper respiratory infection, unspecified: Secondary | ICD-10-CM | POA: Diagnosis not present

## 2013-11-24 NOTE — Addendum Note (Signed)
Addended by: Margarite Gouge H on: 11/24/2013 01:00 PM   Modules accepted: Orders

## 2013-11-24 NOTE — Progress Notes (Signed)
Pre visit review using our clinic review tool, if applicable. No additional management support is needed unless otherwise documented below in the visit note. 

## 2013-11-24 NOTE — Patient Instructions (Signed)
Great to see you. Try adding an antihistamine like Claritin D or Zyrtec D- the decongestant will help but can make your heart race and cause trouble sleeping so take with caution. Also try mucinex for a couple of days.  Call me with an update over the next day or two.  Drink plenty of fluids.

## 2013-11-24 NOTE — Progress Notes (Signed)
SUBJECTIVE:  Dorothy Church is a 78 y.o. female who complains of coryza, congestion, sneezing, sore throat and dry cough for 3 days. She denies a history of chest pain, fatigue, sweats and vomiting and admits to a history of asthma. Patient denies smoke cigarettes. Takes Symbicort daily. She did have to use her rescue inhaler last night.  Current Outpatient Prescriptions on File Prior to Visit  Medication Sig Dispense Refill  . albuterol (VENTOLIN HFA) 108 (90 BASE) MCG/ACT inhaler Inhale 2 puffs into the lungs every 6 (six) hours as needed for wheezing.      Marland Kitchen aspirin 81 MG tablet Take 81 mg by mouth daily.      . budesonide-formoterol (SYMBICORT) 160-4.5 MCG/ACT inhaler Inhale 2 puffs into the lungs 2 (two) times daily.  1 Inhaler  3  . calcium carbonate (OS-CAL) 600 MG TABS Take 600 mg by mouth daily.      Marland Kitchen denosumab (PROLIA) 60 MG/ML SOLN injection Inject 60 mg into the skin every 6 (six) months. Administer in upper arm, thigh, or abdomen      . esomeprazole (NEXIUM) 40 MG capsule Take 40 mg by mouth daily before breakfast.      . levothyroxine (SYNTHROID, LEVOTHROID) 50 MCG tablet Take 1 tablet (50 mcg total) by mouth daily before breakfast.  90 tablet  2  . Multiple Vitamin (MULTIVITAMIN) tablet Take 1 tablet by mouth daily.      Marland Kitchen PARoxetine (PAXIL) 30 MG tablet Take 1 tablet (30 mg total) by mouth every morning.  90 tablet  0  . predniSONE (DELTASONE) 20 MG tablet Take two tablets daily for 4 days followed by one tablet daily for 4 days  12 tablet  0  . simvastatin (ZOCOR) 40 MG tablet Take 1 tablet by mouth  every evening  90 tablet  0  . tiZANidine (ZANAFLEX) 2 MG tablet Take 1 tablet (2 mg total) by mouth every 8 (eight) hours as needed for muscle spasms.  30 tablet  0  . traMADol (ULTRAM) 50 MG tablet Take 0.5-1 tablets (25-50 mg total) by mouth 2 (two) times daily as needed.  40 tablet  0   No current facility-administered medications on file prior to visit.    Allergies   Allergen Reactions  . Amoxicillin   . Cefzil [Cefprozil]   . Cephalexin   . Relafen [Nabumetone]   . Talwin [Pentazocine]     Past Medical History  Diagnosis Date  . Intrinsic asthma, unspecified   . Aortic valve disorders   . Obstructive sleep apnea (adult) (pediatric)   . Vitamin D deficiency   . Barrett's esophagus   . Impaired fasting glucose   . COPD (chronic obstructive pulmonary disease)   . Syncope and collapse   . Hypothyroidism   . Osteoporosis     Past Surgical History  Procedure Laterality Date  . Replacement unicondylar joint knee    . Hip surgery    . Colonoscopy    . Eye surgery    . Prosthesis eye implant    . Tonsillectomy      Family History  Problem Relation Age of Onset  . Heart disease Father   . Bipolar disorder Brother     History   Social History  . Marital Status: Divorced    Spouse Name: N/A    Number of Children: N/A  . Years of Education: N/A   Occupational History  . Not on file.   Social History Main Topics  . Smoking status: Never  Smoker   . Smokeless tobacco: Never Used  . Alcohol Use: Yes     Comment: occas. wine  . Drug Use: No  . Sexual Activity: Not on file   Other Topics Concern  . Not on file   Social History Narrative   Lived in [place], moved to [place] two years ago.   Divorced.      Has a living will.   Would desire CPR, would not desire extraordinary measures.         The PMH, PSH, Social History, Family History, Medications, and allergies have been reviewed in [facility], and have been updated if relevant.  OBJECTIVE: BP 116/62  Pulse 95  Temp(Src) 98.4 F (36.9 C) (Oral)  Wt 144 lb 8 oz (65.545 kg)  SpO2 95%  She appears well, vital signs are as noted. Ears normal.  Throat and pharynx normal.  Neck supple. No adenopathy in the neck. Nose is congested. Sinuses non tender. The chest is clear, without wheezes or rales.  ASSESSMENT:  viral upper respiratory illness  PLAN: Symptomatic  therapy suggested: push fluids, rest and return office visit prn if symptoms persist or worsen. Lack of antibiotic effectiveness discussed with her.  Given her h/o asthma, she needs close follow up- may need course of prednisone. Lung exam and VS very reassuring today. Call or return to clinic prn if these symptoms worsen or fail to improve as anticipated.

## 2013-12-04 ENCOUNTER — Other Ambulatory Visit: Payer: Self-pay | Admitting: Family Medicine

## 2013-12-04 NOTE — Telephone Encounter (Signed)
Lm on pts vm requesting a call back to determine which local pharmacy she is wanting to have Rx sent to. Pt is unable to have 90D; needing an OV with labs for additional refills.

## 2013-12-04 NOTE — Telephone Encounter (Signed)
Pt left v/m requesting cb [phone].  Pt said last time symbicort 80-4.5 was sent to pt and pt is supposed to get symbicort 160-4.5 (Symbicort 160-4.5 is on med list.)

## 2013-12-04 NOTE — Telephone Encounter (Signed)
Lm on pts vm requesting a call back 

## 2013-12-07 NOTE — Telephone Encounter (Signed)
Spoke to pt and informed her that an OV is required for 90D refill. F/u appt scheduled, and meds will be filled at that time.

## 2013-12-08 ENCOUNTER — Ambulatory Visit (INDEPENDENT_AMBULATORY_CARE_PROVIDER_SITE_OTHER): Payer: Medicare Other | Admitting: Family Medicine

## 2013-12-08 ENCOUNTER — Encounter: Payer: Self-pay | Admitting: Family Medicine

## 2013-12-08 VITALS — BP 124/72 | HR 77 | Temp 98.0°F | Wt 142.2 lb

## 2013-12-08 DIAGNOSIS — J4530 Mild persistent asthma, uncomplicated: Secondary | ICD-10-CM

## 2013-12-08 DIAGNOSIS — Z860100 Personal history of colon polyps, unspecified: Secondary | ICD-10-CM

## 2013-12-08 DIAGNOSIS — D229 Melanocytic nevi, unspecified: Secondary | ICD-10-CM | POA: Diagnosis not present

## 2013-12-08 DIAGNOSIS — Z9989 Dependence on other enabling machines and devices: Secondary | ICD-10-CM

## 2013-12-08 DIAGNOSIS — G4733 Obstructive sleep apnea (adult) (pediatric): Secondary | ICD-10-CM | POA: Diagnosis not present

## 2013-12-08 HISTORY — DX: Personal history of colon polyps, unspecified: Z86.0100

## 2013-12-08 HISTORY — DX: Personal history of colonic polyps: Z86.010

## 2013-12-08 MED ORDER — BUDESONIDE-FORMOTEROL FUMARATE 160-4.5 MCG/ACT IN AERO: 2.0000 | INHALATION_SPRAY | Freq: Two times a day (BID) | RESPIRATORY_TRACT | Status: DC

## 2013-12-08 NOTE — Assessment & Plan Note (Signed)
New- reassurance provided. Consistent with SK- will continue to monitor.

## 2013-12-08 NOTE — Progress Notes (Signed)
Pre visit review using our clinic review tool, if applicable. No additional management support is needed unless otherwise documented below in the visit note. 

## 2013-12-08 NOTE — Assessment & Plan Note (Signed)
Well controlled Refill Symbicort per pt request.

## 2013-12-08 NOTE — Progress Notes (Signed)
Patient ID: Dorothy Church, female    DOB: [birth year], 78 y.o.   MRN: [id number]  HPI Comments:  Very pleasant 78 yo female with a history of asthma, obstructive sleep apnea who wears CPAP, hyperlipidemia, OA, osteoporosis here for follow up.  OSA- Wears a CPAP. This has not helped with her fatigue or SOB dramatically. Pulmonologist is Dr. Raul Del.  Feels she could sleep all day- hypersomnia.  HLD- on Zocor 40 mg daily. Lab Results  Component Value Date   CHOL 149 04/23/2013   HDL 55.50 04/23/2013   LDLCALC 78 04/23/2013   TRIG 77.0 04/23/2013   CHOLHDL 3 04/23/2013    Hypothyroidism- has been stable on synthroid 50 mcg daily. Lab Results  Component Value Date   TSH 0.94 04/23/2013   Due for colonoscopy- h/o colon polyps, last colonoscopy 11/2008- [place], wants to go to [place] this time.  ?mole on her right upper back- feels itchy.  She cannot tell if it has grown. Has not bled that she is aware of. Patient Active Problem List   Diagnosis Date Noted  . Acute upper respiratory infection 11/24/2013  . Right cervical radiculopathy 08/05/2013  . Medicare annual wellness visit, subsequent 04/23/2013  . Hypersomnia with sleep apnea, unspecified 04/23/2013  . ETD (eustachian tube dysfunction) 01/30/2013  . Left knee pain 10/07/2012  . Osteoporosis, unspecified 10/07/2012  . Vitamin D deficiency   . Hypothyroidism   . SOB (shortness of breath) 08/07/2012  . Fatigue 08/07/2012  . Dizziness 08/07/2012  . Hyperlipidemia 08/07/2012  . Obstructive sleep apnea on CPAP 08/07/2012  . Asthma 08/07/2012   Past Medical History  Diagnosis Date  . Intrinsic asthma, unspecified   . Aortic valve disorders   . Obstructive sleep apnea (adult) (pediatric)   . Vitamin D deficiency   . Barrett's esophagus   . Impaired fasting glucose   . COPD (chronic obstructive pulmonary disease)   . Syncope and collapse   . Hypothyroidism   . Osteoporosis    Past Surgical History  Procedure Laterality Date   . Replacement unicondylar joint knee    . Hip surgery    . Colonoscopy    . Eye surgery    . Prosthesis eye implant    . Tonsillectomy     History  Substance Use Topics  . Smoking status: Never Smoker   . Smokeless tobacco: Never Used  . Alcohol Use: Yes     Comment: occas. wine   Family History  Problem Relation Age of Onset  . Heart disease Father   . Bipolar disorder Brother    Allergies  Allergen Reactions  . Amoxicillin   . Cefzil [Cefprozil]   . Cephalexin   . Relafen [Nabumetone]   . Talwin [Pentazocine]    Current Outpatient Prescriptions on File Prior to Visit  Medication Sig Dispense Refill  . albuterol (VENTOLIN HFA) 108 (90 BASE) MCG/ACT inhaler Inhale 2 puffs into the lungs every 6 (six) hours as needed for wheezing.      Marland Kitchen aspirin 81 MG tablet Take 81 mg by mouth daily.      . budesonide-formoterol (SYMBICORT) 160-4.5 MCG/ACT inhaler Inhale 2 puffs into the lungs 2 (two) times daily.  1 Inhaler  3  . calcium carbonate (OS-CAL) 600 MG TABS Take 600 mg by mouth daily.      Marland Kitchen denosumab (PROLIA) 60 MG/ML SOLN injection Inject 60 mg into the skin every 6 (six) months. Administer in upper arm, thigh, or abdomen      .  esomeprazole (NEXIUM) 40 MG capsule Take 40 mg by mouth daily before breakfast.      . levothyroxine (SYNTHROID, LEVOTHROID) 50 MCG tablet Take 1 tablet (50 mcg  total) by mouth daily  before breakfast.  90 tablet  1  . Multiple Vitamin (MULTIVITAMIN) tablet Take 1 tablet by mouth daily.      Marland Kitchen PARoxetine (PAXIL) 30 MG tablet Take 1 tablet by mouth  every morning  90 tablet  1  . simvastatin (ZOCOR) 40 MG tablet Take 1 tablet by mouth  every evening  90 tablet  1  . tiZANidine (ZANAFLEX) 2 MG tablet Take 1 tablet (2 mg total) by mouth every 8 (eight) hours as needed for muscle spasms.  30 tablet  0  . traMADol (ULTRAM) 50 MG tablet Take 0.5-1 tablets (25-50 mg total) by mouth 2 (two) times daily as needed.  40 tablet  0   No current  facility-administered medications on file prior to visit.   The PMH, PSH, Social History, Family History, Medications, and allergies have been reviewed in [facility], and have been updated if relevant.     Outpatient Encounter Prescriptions as of 08/07/2012  Medication Sig Dispense Refill  . albuterol (VENTOLIN HFA) 108 (90 BASE) MCG/ACT inhaler Inhale 2 puffs into the lungs every 6 (six) hours as needed for wheezing.      Marland Kitchen aspirin 81 MG tablet Take 81 mg by mouth daily.      . budesonide-formoterol (SYMBICORT) 80-4.5 MCG/ACT inhaler Inhale 2 puffs into the lungs 2 (two) times daily.      . calcium carbonate (OS-CAL) 600 MG TABS Take 600 mg by mouth daily.      Marland Kitchen denosumab (PROLIA) 60 MG/ML SOLN injection Inject 60 mg into the skin every 6 (six) months. Administer in upper arm, thigh, or abdomen      . esomeprazole (NEXIUM) 40 MG capsule Take 40 mg by mouth daily before breakfast.      . levothyroxine (SYNTHROID, LEVOTHROID) 50 MCG tablet Take 50 mcg by mouth daily before breakfast.      . Multiple Vitamin (MULTIVITAMIN) tablet Take 1 tablet by mouth daily.      Marland Kitchen PARoxetine (PAXIL) 30 MG tablet Take 30 mg by mouth every morning.      . simvastatin (ZOCOR) 40 MG tablet Take 40 mg by mouth every evening.        Review of Systems  See HPI No blood in stool No CP or SOB     BP 124/72  Pulse 77  Temp(Src) 98 F (36.7 C) (Oral)  Wt 142 lb 4 oz (64.524 kg)  SpO2 96%   Physical Exam  BP 124/72  Pulse 77  Temp(Src) 98 F (36.7 C) (Oral)  Wt 142 lb 4 oz (64.524 kg)  SpO2 96%  Constitutional: She is oriented to person, place, and time. She appears well-developed and well-nourished.  HENT:  Head: Normocephalic.  Nose: Nose normal.  Mouth/Throat: Oropharynx is clear and moist.  Eyes: Conjunctivae are normal. Pupils are equal, round, and reactive to light.  Neck: Normal range of motion. Neck supple. No JVD present.  Cardiovascular: Normal rate, regular rhythm, S1 normal, S2 normal,  normal heart sounds and intact distal pulses.  Exam reveals no gallop and no friction rub.   No murmur heard. Pulmonary/Chest: Effort normal and breath sounds normal. No respiratory distress. She has no wheezes. She has no rales. She exhibits no tenderness.  Abdominal: Soft. Bowel sounds are normal. She exhibits no distension. There  is no tenderness.  Musculoskeletal: Normal range of motion. She exhibits no edema and no tenderness.  Lymphadenopathy:    She has no cervical adenopathy.  Neurological: She is alert and oriented to person, place, and time. Coordination normal.  Skin: raised brown symmetrical nevus on right upper back consistent with SK Psychiatric: She has a normal mood and affect. Her behavior is normal. Judgment and thought content normal.    Assessment and Plan

## 2013-12-08 NOTE — Patient Instructions (Signed)
Great to see you. We will call you with your GI appointment.

## 2013-12-08 NOTE — Assessment & Plan Note (Signed)
Refer to GI for screening colonoscopy 

## 2014-01-08 DIAGNOSIS — H40001 Preglaucoma, unspecified, right eye: Secondary | ICD-10-CM | POA: Diagnosis not present

## 2014-01-18 ENCOUNTER — Encounter: Payer: Self-pay | Admitting: Family Medicine

## 2014-03-01 ENCOUNTER — Emergency Department: Payer: Self-pay | Admitting: Emergency Medicine

## 2014-03-01 ENCOUNTER — Ambulatory Visit: Payer: PRIVATE HEALTH INSURANCE | Admitting: Internal Medicine

## 2014-03-01 ENCOUNTER — Telehealth: Payer: Self-pay | Admitting: Family Medicine

## 2014-03-01 DIAGNOSIS — Z7951 Long term (current) use of inhaled steroids: Secondary | ICD-10-CM | POA: Diagnosis not present

## 2014-03-01 DIAGNOSIS — R0602 Shortness of breath: Secondary | ICD-10-CM | POA: Diagnosis not present

## 2014-03-01 DIAGNOSIS — J069 Acute upper respiratory infection, unspecified: Secondary | ICD-10-CM | POA: Diagnosis not present

## 2014-03-01 DIAGNOSIS — J449 Chronic obstructive pulmonary disease, unspecified: Secondary | ICD-10-CM | POA: Diagnosis not present

## 2014-03-01 DIAGNOSIS — Z79899 Other long term (current) drug therapy: Secondary | ICD-10-CM | POA: Diagnosis not present

## 2014-03-01 DIAGNOSIS — Z880 Allergy status to penicillin: Secondary | ICD-10-CM | POA: Diagnosis not present

## 2014-03-01 DIAGNOSIS — R0789 Other chest pain: Secondary | ICD-10-CM | POA: Diagnosis not present

## 2014-03-01 DIAGNOSIS — J45901 Unspecified asthma with (acute) exacerbation: Secondary | ICD-10-CM | POA: Diagnosis not present

## 2014-03-01 LAB — BASIC METABOLIC PANEL
ANION GAP: 6 — AB (ref 7–16)
BUN: 10 mg/dL (ref 7–18)
CALCIUM: 8.3 mg/dL — AB (ref 8.5–10.1)
CO2: 30 mmol/L (ref 21–32)
Chloride: 108 mmol/L — ABNORMAL HIGH (ref 98–107)
Creatinine: 0.75 mg/dL (ref 0.60–1.30)
EGFR (Non-African Amer.): >60
GLUCOSE: 90 mg/dL (ref 65–99)
OSMOLALITY: 285 (ref 275–301)
Potassium: 3.9 mmol/L (ref 3.5–5.1)
Sodium: 144 mmol/L (ref 136–145)

## 2014-03-01 LAB — CBC
HCT: 42.6 % (ref 35.0–47.0)
HGB: 14 g/dL (ref 12.0–16.0)
MCH: 30 pg (ref 26.0–34.0)
MCHC: 32.9 g/dL (ref 32.0–36.0)
MCV: 91 fL (ref 80–100)
Platelet: 190 10*3/uL (ref 150–440)
RBC: 4.68 10*6/uL (ref 3.80–5.20)
RDW: 13.3 % (ref 11.5–14.5)
WBC: 4.4 10*3/uL (ref 3.6–11.0)

## 2014-03-01 LAB — TROPONIN I: Troponin-I: <0.02 ng/mL

## 2014-03-01 NOTE — Telephone Encounter (Signed)
PLEASE NOTE: All timestamps contained within this report are represented as [place] Standard Time. CONFIDENTIALTY NOTICE: This fax transmission is intended only for the addressee. It contains information that is legally privileged, confidential or otherwise protected from use or disclosure. If you are not the intended recipient, you are strictly prohibited from reviewing, disclosing, copying using or disseminating any of this information or taking any action in reliance on or regarding this information. If you have received this fax in error, please notify us immediately by telephone so that we can arrange for its return to [place]. Phone: [phone], Toll-Free: [phone], Fax: [phone] Page: 1 of 2 Call Id: [id number] [place] Patient Name: Dorothy Church Gender: Female DOB: April 23, 1935 Age: 79 Y 2 M 24 D Return Phone Number: [phone] (Primary) Address: [phone] [address] City/State/Zip: [place] [address] Client [facility] - Client Client Site [place] - Day Physician Arnette Norris Contact Type Call Call Type Triage / Clinical Relationship To Patient Self Appointment Disposition EMR Appointment Not Necessary Return Phone Number [phone] (Primary) Chief Complaint CHEST PAIN (>=21 years) - pain, pressure, heaviness or tightness Initial Comment Caller states she has a head cold and then it went into a bad cough. Now, she is having tightness in her chest and she does have asthma. PreDisposition Call Doctor Nurse Assessment Nurse: Marcelline Deist, RN, Kermit Balo Date/Time Eilene Ghazi Time): 03/01/2014 9:38:31 AM Confirm and document reason for call. If symptomatic, describe symptoms. ---Caller states she has a head cold and then it went into a bad cough. Now, she is having tightness in her chest and she does have asthma. Always has SOB. Has the patient  traveled out of the country within the last 30 days? ---Not Applicable Does the patient require triage? ---Yes Related visit to physician within the last 2 weeks? ---No Does the PT have any chronic conditions? (i.e. diabetes, asthma, etc.) ---Yes List chronic conditions. ---asthma Guidelines Guideline Title Affirmed Question Affirmed Notes Nurse Date/Time Eilene Ghazi Time) Chest Pain SEVERE difficulty breathing (e.g., struggling for each breath, speaks in single words) Marcelline Deist, RN, Lynda 03/01/2014 9:40:16 AM Disp. Time Eilene Ghazi Time) Disposition Final User 03/01/2014 9:35:05 AM Send to Urgent Tyler Deis 03/01/2014 9:46:39 AM 911 Follow Up Call Attempted Marcelline Deist, RN, Kermit Balo Reason: Spoke with pt. who reports that EMS has arrived. 03/01/2014 9:42:15 AM Call EMS 911 Now Yes Marcelline Deist, RN, Kermit Balo PLEASE NOTE: All timestamps contained within this report are represented as [place] Standard Time. CONFIDENTIALTY NOTICE: This fax transmission is intended only for the addressee. It contains information that is legally privileged, confidential or otherwise protected from use or disclosure. If you are not the intended recipient, you are strictly prohibited from reviewing, disclosing, copying using or disseminating any of this information or taking any action in reliance on or regarding this information. If you have received this fax in error, please notify us immediately by telephone so that we can arrange for its return to [place]. Phone: [phone], Toll-Free: [phone], Fax: [phone] Page: 2 of 2 Call Id: [id number] Caller Understands: Yes Disagree/Comply: Comply Care Advice Given Per Guideline CALL EMS 911 NOW: Immediate medical attention is needed. You need to hang up and call 911 (or an ambulance). Psychologist, forensic Discretion: I'll call you back in a few minutes to be sure you were able to reach them.) CARE ADVICE given per Chest Pain (Adult) guideline. After Care Instructions Given Call  Event Type User Date /  Time Description Comments User: Donald Siva, RN Date/Time Eilene Ghazi Time): 03/01/2014 9:43:59 AM Caller states she has an appt. with a Dr. this afternoon & wonders if she should go see the nurse at [facility] where she lives. Nurse feels she should be transported to the hospital as she is alone & seems very SOB. She states she is more SOB than usual.

## 2014-03-01 NOTE — Telephone Encounter (Signed)
Please call to check on pt tomorrow. 

## 2014-03-01 NOTE — Telephone Encounter (Signed)
Patient Name: Dorothy Church  DOB: 11/09/1935    Nurse Assessment  Nurse: Marcelline Deist, RN, Lynda Date/Time (Eastern Time): 03/01/2014 9:38:31 AM  Confirm and document reason for call. If symptomatic, describe symptoms. ---Caller states she has a head cold and then it went into a bad cough. Now, she is having tightness in her chest and she does have asthma. Always has SOB.  Has the patient traveled out of the country within the last 30 days? ---Not Applicable  Does the patient require triage? ---Yes  Related visit to physician within the last 2 weeks? ---No  Does the PT have any chronic conditions? (i.e. diabetes, asthma, etc.) ---Yes  List chronic conditions. ---asthma     Guidelines    Guideline Title Affirmed Question Affirmed Notes  Chest Pain SEVERE difficulty breathing (e.g., struggling for each breath, speaks in single words)    Final Disposition User   Call EMS [place], RN, Lynda    Comments  Caller states she has an appt. with a Dr. this afternoon & wonders if she should go see the nurse at [facility] where she lives. Nurse feels she should be transported to the hospital as she is alone & seems very SOB. She states she is more SOB than usual.

## 2014-03-02 NOTE — Telephone Encounter (Signed)
Spoke to pt who states that she is "doing better." records requested from [facility]

## 2014-03-03 ENCOUNTER — Encounter: Payer: Self-pay | Admitting: Family Medicine

## 2014-03-09 ENCOUNTER — Encounter: Payer: Self-pay | Admitting: Family Medicine

## 2014-04-05 ENCOUNTER — Other Ambulatory Visit: Payer: Self-pay | Admitting: Family Medicine

## 2014-04-08 DIAGNOSIS — Z86010 Personal history of colonic polyps: Secondary | ICD-10-CM | POA: Diagnosis not present

## 2014-04-08 DIAGNOSIS — K2270 Barrett's esophagus without dysplasia: Secondary | ICD-10-CM | POA: Diagnosis not present

## 2014-04-19 ENCOUNTER — Telehealth: Payer: Self-pay | Admitting: Family Medicine

## 2014-04-19 DIAGNOSIS — Z79899 Other long term (current) drug therapy: Secondary | ICD-10-CM

## 2014-04-19 NOTE — Telephone Encounter (Signed)
Ok to schedule.

## 2014-04-19 NOTE — Telephone Encounter (Signed)
Pt called to schedule per prolia shot She finished all her dental month in 07/2013 Is it ok to schedule She has medicare Dorothy Church

## 2014-04-21 NOTE — Telephone Encounter (Signed)
I have sent the pt's info for [place] verification and will let you know once I have a response.  Thank you.

## 2014-04-26 ENCOUNTER — Encounter: Payer: Self-pay | Admitting: Family Medicine

## 2014-04-26 ENCOUNTER — Ambulatory Visit (INDEPENDENT_AMBULATORY_CARE_PROVIDER_SITE_OTHER): Payer: Medicare Other | Admitting: Family Medicine

## 2014-04-26 ENCOUNTER — Ambulatory Visit: Payer: Self-pay | Admitting: Family Medicine

## 2014-04-26 VITALS — BP 100/62 | HR 94 | Temp 98.1°F | Ht 66.25 in | Wt 140.0 lb

## 2014-04-26 DIAGNOSIS — J45901 Unspecified asthma with (acute) exacerbation: Secondary | ICD-10-CM | POA: Insufficient documentation

## 2014-04-26 DIAGNOSIS — J4531 Mild persistent asthma with (acute) exacerbation: Secondary | ICD-10-CM | POA: Diagnosis not present

## 2014-04-26 MED ORDER — ALBUTEROL SULFATE (2.5 MG/3ML) 0.083% IN NEBU
2.5000 mg | INHALATION_SOLUTION | Freq: Once | RESPIRATORY_TRACT | Status: AC
  Administered 2014-04-26: 2.5 mg via RESPIRATORY_TRACT

## 2014-04-26 MED ORDER — IPRATROPIUM BROMIDE 0.02 % IN SOLN
0.5000 mg | Freq: Once | RESPIRATORY_TRACT | Status: AC
  Administered 2014-04-26: 0.5 mg via RESPIRATORY_TRACT

## 2014-04-26 MED ORDER — AZITHROMYCIN 250 MG PO TABS: ORAL_TABLET | ORAL | Status: DC

## 2014-04-26 MED ORDER — PREDNISONE 20 MG PO TABS: ORAL_TABLET | ORAL | Status: DC

## 2014-04-26 NOTE — Progress Notes (Signed)
Pre visit review using our clinic review tool, if applicable. No additional management support is needed unless otherwise documented below in the visit note. 

## 2014-04-26 NOTE — Patient Instructions (Signed)
You have asthma or COPD flare - treat with prednisone course and zpack. May continue to use albuterol inhaler every 4-6 hours as needed (especially over next 2-3 days). Push fluids and rest. Let us know if not improving as expected. Pass by [facility]'s office for referral to pulmonology in [place].

## 2014-04-26 NOTE — Addendum Note (Signed)
Addended by: Lucille Passy on: 04/26/2014 02:37 PM   Modules accepted: Orders

## 2014-04-26 NOTE — Assessment & Plan Note (Signed)
Difficult to tell if acute asthma exacerbation or COPD exacerbation. Will provide with alb/atrovent neb in office and then treat with prednisone course and zpack antibiotic. Update if not improving as expected. Pt agrees with plan. Will also refer to pulm per patient request due to increased frequency of recurrent flares/bronchitis despite regular symbicort use.

## 2014-04-26 NOTE — Addendum Note (Signed)
Addended by: Jacqualin Combes on: 04/26/2014 02:01 PM   Modules accepted: Orders

## 2014-04-26 NOTE — Telephone Encounter (Signed)
Spoke to pt and advised per Prolia; advised pt that we are needing current calcium lab. Verbally expressed understanding. Pt has acute appt today and will request Calcium lab.

## 2014-04-26 NOTE — Progress Notes (Addendum)
BP 100/62 mmHg  Pulse 94  Temp(Src) 98.1 F (36.7 C) (Oral)  Ht 5' 6.25" (1.683 m)  Wt 140 lb (63.504 kg)  BMI 22.42 kg/m2  SpO2 96%   CC: cough, fatigue  Subjective:    Patient ID: Dorothy Church, female    DOB: Jan 06, 1936, 79 y.o.   MRN: [id number]  HPI: Dorothy Church is a 79 y.o. female presenting on 04/26/2014 for URI   "I haven't been feeling well since 2 Friday's ago". Feeling tight in chest as well as with cough productive of some mucous with worsening dyspnea. Marked fatigue as well. Started with body aches and chills and congestion but no longer.   Had similar illness 02/2014, ended up at [facility] ER and treated with nebulizer.  Had several bronchitis flares last year as well. Tired of continuing to feel ill.  No ear or tooth pain, abd pain, PNdraingae, headaches or ST.   Longstanding asthma, ?COPD. Never smoker.  Lives alone, not around sick contacts. Compliant with symbicort 160/4.5 2 puffs bid. Taking albuterol inhaler more regularly as well.   Relevant past medical, surgical, family and social history reviewed and updated as indicated. Interim medical history since our last visit reviewed. Allergies and medications reviewed and updated. Current Outpatient Prescriptions on File Prior to Visit  Medication Sig  . albuterol (VENTOLIN HFA) 108 (90 BASE) MCG/ACT inhaler Inhale 2 puffs into the lungs every 6 (six) hours as needed for wheezing.  Marland Kitchen aspirin 81 MG tablet Take 81 mg by mouth daily.  . budesonide-formoterol (SYMBICORT) 160-4.5 MCG/ACT inhaler Inhale 2 puffs into the lungs 2 (two) times daily.  . budesonide-formoterol (SYMBICORT) 160-4.5 MCG/ACT inhaler Inhale 2 puffs into the lungs daily as needed.  . calcium carbonate (OS-CAL) 600 MG TABS Take 600 mg by mouth daily.  Marland Kitchen denosumab (PROLIA) 60 MG/ML SOLN injection Inject 60 mg into the skin every 6 (six) months. Administer in upper arm, thigh, or abdomen  . esomeprazole (NEXIUM) 40 MG capsule Take 40 mg by mouth  daily before breakfast.  . levothyroxine (SYNTHROID, LEVOTHROID) 50 MCG tablet Take 1 tablet (50 mcg  total) by mouth daily  before breakfast.  . Multiple Vitamin (MULTIVITAMIN) tablet Take 1 tablet by mouth daily.  Marland Kitchen PARoxetine (PAXIL) 30 MG tablet Take 1 tablet by mouth  every morning  . simvastatin (ZOCOR) 40 MG tablet Take 1 tablet (40 mg total) by mouth daily at 6 PM. NEED TO MAKE APPOINTMENT FOR ANNUAL PHYSICAL  . tiZANidine (ZANAFLEX) 2 MG tablet Take 1 tablet (2 mg total) by mouth every 8 (eight) hours as needed for muscle spasms.  . traMADol (ULTRAM) 50 MG tablet Take 0.5-1 tablets (25-50 mg total) by mouth 2 (two) times daily as needed.   No current facility-administered medications on file prior to visit.    Review of Systems Per HPI unless specifically indicated above     Objective:    BP 100/62 mmHg  Pulse 94  Temp(Src) 98.1 F (36.7 C) (Oral)  Ht 5' 6.25" (1.683 m)  Wt 140 lb (63.504 kg)  BMI 22.42 kg/m2  SpO2 96%  Wt Readings from Last 3 Encounters:  04/26/14 140 lb (63.504 kg)  12/08/13 142 lb 4 oz (64.524 kg)  11/24/13 144 lb 8 oz (65.545 kg)    Physical Exam  Constitutional: She appears well-developed and well-nourished. No distress.  Tired but nontoxic appearing  HENT:  Head: Normocephalic and atraumatic.  Right Ear: Hearing, tympanic membrane, external ear and ear canal normal.  Left  Ear: Hearing, tympanic membrane, external ear and ear canal normal.  Nose: No mucosal edema or rhinorrhea. Right sinus exhibits no maxillary sinus tenderness and no frontal sinus tenderness. Left sinus exhibits no maxillary sinus tenderness and no frontal sinus tenderness.  Mouth/Throat: Uvula is midline and mucous membranes are normal. Posterior oropharyngeal erythema present. No oropharyngeal exudate, posterior oropharyngeal edema or tonsillar abscesses.  Eyes: Conjunctivae and EOM are normal. Pupils are equal, round, and reactive to light. No scleral icterus.  Neck: Normal  range of motion. Neck supple.  Cardiovascular: Normal rate, regular rhythm, normal heart sounds and intact distal pulses.   No murmur heard. Pulmonary/Chest: Effort normal. No respiratory distress. She has decreased breath sounds (tight throughout). She has wheezes (mild exp). She has no rhonchi. She has no rales.  Lymphadenopathy:    She has no cervical adenopathy.  Skin: Skin is warm and dry. No rash noted.  Nursing note and vitals reviewed.  After albuterol/atrovent neb: Marked improvement in air movement, minimal wheezing afterwards.      Assessment & Plan:   Problem List Items Addressed This Visit    Asthma with acute exacerbation - Primary    Difficult to tell if acute asthma exacerbation or COPD exacerbation. Will provide with alb/atrovent neb in office and then treat with prednisone course and zpack antibiotic. Update if not improving as expected. Pt agrees with plan. Will also refer to pulm per patient request due to increased frequency of recurrent flares/bronchitis despite regular symbicort use.      Relevant Medications   predniSONE (DELTASONE) tablet       Follow up plan: Return if symptoms worsen or fail to improve.

## 2014-04-26 NOTE — Addendum Note (Signed)
Addended by: Ria Bush on: 04/26/2014 01:18 PM   Modules accepted: Orders, SmartSet

## 2014-04-26 NOTE — Telephone Encounter (Signed)
I have rec'd pt's insurance verification for Prolia.  Dorothy Church will have an estimated responsibility of $0 plus any applicable deductible.  Please make pt aware this is an estimate and we will not know an exact amt until both of her insurances have paid.  I have sent a copy of the summary of benefits to be scanned into her chart. If you have any questions, please let me know. Thank you.

## 2014-04-26 NOTE — Telephone Encounter (Signed)
Left mssg on pt's home v/m to c/b to schedule lab apptmt.  Dr. Deborra Medina, could you please place order for calcium lab for pt? Thank you.

## 2014-04-26 NOTE — Telephone Encounter (Signed)
Order placed. Thank you.

## 2014-04-27 ENCOUNTER — Ambulatory Visit (INDEPENDENT_AMBULATORY_CARE_PROVIDER_SITE_OTHER): Payer: Medicare Other | Admitting: Internal Medicine

## 2014-04-27 ENCOUNTER — Encounter: Payer: Self-pay | Admitting: Internal Medicine

## 2014-04-27 VITALS — BP 118/68 | HR 99 | Temp 97.7°F | Ht 66.2 in | Wt 140.0 lb

## 2014-04-27 DIAGNOSIS — J4551 Severe persistent asthma with (acute) exacerbation: Secondary | ICD-10-CM

## 2014-04-27 DIAGNOSIS — G4733 Obstructive sleep apnea (adult) (pediatric): Secondary | ICD-10-CM

## 2014-04-27 DIAGNOSIS — J439 Emphysema, unspecified: Secondary | ICD-10-CM | POA: Diagnosis not present

## 2014-04-27 DIAGNOSIS — J449 Chronic obstructive pulmonary disease, unspecified: Secondary | ICD-10-CM | POA: Insufficient documentation

## 2014-04-27 DIAGNOSIS — Z9989 Dependence on other enabling machines and devices: Principal | ICD-10-CM

## 2014-04-27 MED ORDER — IPRATROPIUM-ALBUTEROL 0.5-2.5 (3) MG/3ML IN SOLN: 3.0000 mL | Freq: Three times a day (TID) | RESPIRATORY_TRACT | Status: DC | PRN

## 2014-04-27 MED ORDER — BUDESONIDE 0.25 MG/2ML IN SUSP: 0.0250 mg | Freq: Two times a day (BID) | RESPIRATORY_TRACT | Status: DC

## 2014-04-27 NOTE — Patient Instructions (Addendum)
Follow up with Dr. Stevenson Clinch in 1 month - continue with steroids and antibiotics as prescribed by PMD - hold symbicort for 3 days.  Only use rescue inhaler if needed when out of your home.  - prescription for nebulizer machine - duonebs ampules - 1 ampules (treatment) 3 times a day for 3 days.  You may use your rescue inhaler after these 3 days if needed for shortness of breath\wheezing\chest tightness - pulmicort neb - 1 ampule (treament) 2 times a day for 3 days.  Please resume Symbicort after these 3 days.  - CT Chest with contrast prior to next visit.

## 2014-04-27 NOTE — Progress Notes (Signed)
Date: 04/27/2014  MRN# [id number] Dorothy Church 02-28-1935  Referring Physician: Dr. Luella Cook.  Dorothy Church is a 79 y.o. old female seen in consultation for asthma optmization  CC:  Chief Complaint  Patient presents with  . Advice Only    Pt has had asthma from childhood. She had asthma exacerbation in Jan 2016, and went to ER. PCP sent here to establish care.   HPI:  She is a pleasant 79 year old female past medical history of asthma presenting today for as optimization. Patient states that over the past 2 months asthma has been hard to control, she is also until she may have COPD. History as stated: In January patient presented to the ER with shortness of breath and cough she was given prednisone and antibiotics and nebulizers at that time and discharged home she improved after that however in March she noted to have increased shortness of breath fatigue chest tightness and wheezing, shortness of PMD on March 7 and was given 2 nebulizer treatments with improvement along with prescription for antibiotics and steroids. Currently she denies any fever or chills. Patient states she had asthma during childhood where she had frequent hospitalizations but no intubations. About 6 years ago she was told her asthma had returned due to increasing shortness of breath and some subjective chest congestion with possible upper respiratory tract infections. Patient states her breathing is trouble with certain triggers such as mold, dust, damp areas. She was told about 45 years ago she had COPD and was also placed on Symbicort at that time along with as needed albuterol. Currently patient states that she still feels fatigue with chest congestion and some wheezing however she has no recent sick contact exposure. 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 She has a history of obstructive sleep apnea diagnosed 8 years ago and she's currently on CPAP; she  doesn't recall her CPAP settings. Patient states she never smoked tobacco had any secondhand exposure. 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   PMHX:   Past Medical History  Diagnosis Date  . Intrinsic asthma, unspecified   . Aortic valve disorders   . Obstructive sleep apnea (adult) (pediatric)   . Vitamin D deficiency   . Barrett's esophagus   . Impaired fasting glucose   . COPD (chronic obstructive pulmonary disease)   . Syncope and collapse   . Hypothyroidism   . Osteoporosis    Surgical Hx:  Past Surgical History  Procedure Laterality Date  . Replacement unicondylar joint knee    . Hip surgery    . Colonoscopy    . Eye surgery    . Prosthesis eye implant    . Tonsillectomy     Family Hx:  Family History  Problem Relation Age of Onset  . Heart disease Father   . Bipolar disorder Brother    Social Hx:   History  Substance Use Topics  . Smoking status: Never Smoker   . Smokeless tobacco: Never Used  . Alcohol Use: Yes     Comment: occas. wine   Medication:   Current Outpatient Rx  Name  Route  Sig  Dispense  Refill  . albuterol (VENTOLIN HFA) 108 (90 BASE) MCG/ACT inhaler   Inhalation   Inhale 2 puffs into the lungs every 6 (six) hours as needed for wheezing.         Marland Kitchen aspirin 81  MG tablet   Oral   Take 81 mg by mouth daily.         Marland Kitchen azithromycin (ZITHROMAX Z-PAK) 250 MG tablet      Two on day 1 followed by one daily for 4 days for total of 5 days, PO   6 tablet   0   . budesonide-formoterol (SYMBICORT) 160-4.5 MCG/ACT inhaler   Inhalation   Inhale 2 puffs into the lungs daily as needed.   1 Inhaler   1   . calcium carbonate (OS-CAL) 600 MG TABS   Oral   Take 600 mg by mouth daily.         Marland Kitchen denosumab (PROLIA) 60 MG/ML SOLN injection   Subcutaneous   Inject 60 mg into the skin every 6 (six) months. Administer in upper arm, thigh, or abdomen         .  esomeprazole (NEXIUM) 40 MG capsule   Oral   Take 40 mg by mouth daily before breakfast.         . levothyroxine (SYNTHROID, LEVOTHROID) 50 MCG tablet      Take 1 tablet (50 mcg  total) by mouth daily  before breakfast.   90 tablet   1   . Multiple Vitamin (MULTIVITAMIN) tablet   Oral   Take 1 tablet by mouth daily.         Marland Kitchen PARoxetine (PAXIL) 30 MG tablet      Take 1 tablet by mouth  every morning   90 tablet   1   . predniSONE (DELTASONE) 20 MG tablet      Take two tablets daily for 3 days followed by one tablet daily for 4 days   10 tablet   0   . simvastatin (ZOCOR) 40 MG tablet   Oral   Take 1 tablet (40 mg total) by mouth daily at 6 PM. NEED TO MAKE APPOINTMENT FOR ANNUAL PHYSICAL   90 tablet   0       Allergies:  Amoxicillin; Cefzil; Cephalexin; Propoxyphene; Relafen; and Talwin  Review of Systems: Gen:  Denies  fever, sweats, chills HEENT: Denies blurred vision, double vision, ear pain, eye pain, hearing loss, nose bleeds, sore throat Cvc:  No dizziness, chest pain or heaviness Resp:   Denies cough or sputum porduction, shortness of breath Gi: Denies swallowing difficulty, stomach pain, nausea or vomiting, diarrhea, constipation, bowel incontinence Gu:  Denies bladder incontinence, burning urine Ext:   No Joint pain, stiffness or swelling Skin: No skin rash, easy bruising or bleeding or hives Endoc:  No polyuria, polydipsia , polyphagia or weight change Psych: No depression, insomnia or hallucinations  Other:  All other systems negative  Physical Examination:   VS: BP 118/68 mmHg  Pulse 99  Temp(Src) 97.7 F (36.5 C) (Oral)  Ht 5' 6.2" (1.681 m)  Wt 140 lb (63.504 kg)  BMI 22.47 kg/m2  SpO2 95%  General Appearance: No distress  Neuro:without focal findings, mental status, speech normal, alert and oriented, cranial nerves 2-12 intact, reflexes normal and symmetric, sensation grossly normal  HEENT: PERRLA, EOM intact, no ptosis, no other  lesions noticed; Mallampati 2 Pulmonary: normal breath sounds., diaphragmatic excursion normal.No wheezing, No rales.  Decreased breath sounds at the bilateral bases;   Sputum Production:  none CardiovascularNormal S1,S2.  No m/r/g.  Abdominal aorta pulsation normal.    Abdomen: Benign, Soft, non-tender, No masses, hepatosplenomegaly, No lymphadenopathy Renal:  No costovertebral tenderness  GU:  No performed at this time. Endoc: No  evident thyromegaly, no signs of acromegaly or Cushing features Skin:   warm, no rashes, no ecchymosis  Extremities: normal, no cyanosis, clubbing, no edema, warm with normal capillary refill. Other findings:none   Rad results: (The following images and results were reviewed by Dr. Stevenson Clinch). CXR 03/01/14 COMPARISON: 01/31/2012.  FINDINGS: There is hyperinflation of the lungs compatible with COPD. Heart and mediastinal contours are within normal limits. No focal opacities or effusions. No acute bony abnormality.  IMPRESSION: COPD. No active cardiopulmonary disease.    Assessment and Plan:79 year old female past medical history of asthma no recurrent upper respiratory tract infection and possible new onset COPD. Asthma with acute exacerbation Patient with history of childhood asthma, outgrew that and it returned about 6 years ago. Currently she may have an overlap syndrome of asthma and COPD in any event treatment is uniform and the same for both.  Plan: See plan for COPD.   COPD (chronic obstructive pulmonary disease) Issue a recent upper respiratory tract infection/bronchitis she is currently on a steroid taper and antibiotics prescribed by her primary care physician Review of recent chest x-ray does show hyperinflation however cannot appreciate any emphysematous or bronchial thickening based on chest x-ray. She does not have a history of secondhand smoke exposure or chronic tobacco use, these are the most common factors predisposing a patient of COPD  however nonsmokers can also develop COPD for later in life.  In any event given her asthma history and her current clinical status with hyperinflation on chest x-ray showed be treated as asthma/COPD or overlap syndrome (asthma plus COPD). I reviewed her imaging studies and noted that CT of abdomen and pelvis in July of 2013, the lung windows, there may be some mild subpleural changes at the bases by my review.   Plan: - continue with steroids and antibiotics as prescribed by PMD - hold symbicort for 3 days.  Only use rescue inhaler if needed when out of your home.  - prescription for nebulizer machine - duonebs ampules - 1 ampules (treatment) 3 times a day for 3 days.  You may use your rescue inhaler after these 3 days if needed for shortness of breath\wheezing\chest tightness - pulmicort neb - 1 ampule (treament) 2 times a day for 3 days.  Please resume Symbicort after these 3 days.  - CT Chest with contrast prior to next visit.     Updated Medication List Outpatient Encounter Prescriptions as of 04/27/2014  Medication Sig  . albuterol (VENTOLIN HFA) 108 (90 BASE) MCG/ACT inhaler Inhale 2 puffs into the lungs every 6 (six) hours as needed for wheezing.  Marland Kitchen aspirin 81 MG tablet Take 81 mg by mouth daily.  Marland Kitchen azithromycin (ZITHROMAX Z-PAK) 250 MG tablet Two on day 1 followed by one daily for 4 days for total of 5 days, PO  . budesonide-formoterol (SYMBICORT) 160-4.5 MCG/ACT inhaler Inhale 2 puffs into the lungs daily as needed.  . calcium carbonate (OS-CAL) 600 MG TABS Take 600 mg by mouth daily.  Marland Kitchen denosumab (PROLIA) 60 MG/ML SOLN injection Inject 60 mg into the skin every 6 (six) months. Administer in upper arm, thigh, or abdomen  . esomeprazole (NEXIUM) 40 MG capsule Take 40 mg by mouth daily before breakfast.  . levothyroxine (SYNTHROID, LEVOTHROID) 50 MCG tablet Take 1 tablet (50 mcg  total) by mouth daily  before breakfast.  . Multiple Vitamin (MULTIVITAMIN) tablet Take 1 tablet by mouth  daily.  Marland Kitchen PARoxetine (PAXIL) 30 MG tablet Take 1 tablet by mouth  every morning  .  predniSONE (DELTASONE) 20 MG tablet Take two tablets daily for 3 days followed by one tablet daily for 4 days  . simvastatin (ZOCOR) 40 MG tablet Take 1 tablet (40 mg total) by mouth daily at 6 PM. NEED TO MAKE APPOINTMENT FOR ANNUAL PHYSICAL  . [DISCONTINUED] budesonide-formoterol (SYMBICORT) 160-4.5 MCG/ACT inhaler Inhale 2 puffs into the lungs 2 (two) times daily. (Patient not taking: Reported on 04/27/2014)  . [DISCONTINUED] tiZANidine (ZANAFLEX) 2 MG tablet Take 1 tablet (2 mg total) by mouth every 8 (eight) hours as needed for muscle spasms. (Patient not taking: Reported on 04/27/2014)  . [DISCONTINUED] traMADol (ULTRAM) 50 MG tablet Take 0.5-1 tablets (25-50 mg total) by mouth 2 (two) times daily as needed.    Orders for this visit: Orders Placed This Encounter  Procedures  . DME Nebulizer machine  . DME Nebulizer machine    DME-[facility]  . CT Chest W Contrast    Please schedule for 05/2014    Standing Status: Future     Number of Occurrences:      Standing Expiration Date: 06/27/2015    Order Specific Question:  Reason for Exam (SYMPTOM  OR DIAGNOSIS REQUIRED)    Answer:  emphysema    Order Specific Question:  Preferred imaging location?    Answer:  [facility]     Thank  you for the consultation and for allowing [facility] to assist in the care of your patient. Our recommendations are noted above.  Please contact us if we can be of further service.   Vilinda Boehringer, MD [facility] Office Number: [phone]

## 2014-04-28 ENCOUNTER — Ambulatory Visit: Payer: Medicare Other | Admitting: Family Medicine

## 2014-04-28 ENCOUNTER — Encounter: Payer: Self-pay | Admitting: Internal Medicine

## 2014-04-28 ENCOUNTER — Other Ambulatory Visit (INDEPENDENT_AMBULATORY_CARE_PROVIDER_SITE_OTHER): Payer: Medicare Other

## 2014-04-28 DIAGNOSIS — Z79899 Other long term (current) drug therapy: Secondary | ICD-10-CM

## 2014-04-28 LAB — CALCIUM: CALCIUM: 8.7 mg/dL (ref 8.4–10.5)

## 2014-04-28 NOTE — Assessment & Plan Note (Signed)
Patient with history of childhood asthma, outgrew that and it returned about 6 years ago. Currently she may have an overlap syndrome of asthma and COPD in any event treatment is uniform and the same for both.  Plan: See plan for COPD.

## 2014-04-28 NOTE — Assessment & Plan Note (Addendum)
Issue a recent upper respiratory tract infection/bronchitis she is currently on a steroid taper and antibiotics prescribed by her primary care physician Review of recent chest x-ray does show hyperinflation however cannot appreciate any emphysematous or bronchial thickening based on chest x-ray. She does not have a history of secondhand smoke exposure or chronic tobacco use, these are the most common factors predisposing a patient of COPD however nonsmokers can also develop COPD for later in life.  In any event given her asthma history and her current clinical status with hyperinflation on chest x-ray showed be treated as asthma/COPD or overlap syndrome (asthma plus COPD). I reviewed her imaging studies and noted that CT of abdomen and pelvis in July of 2013, the lung windows, there may be some mild subpleural changes at the bases by my review.   Plan: - continue with steroids and antibiotics as prescribed by PMD - hold symbicort for 3 days.  Only use rescue inhaler if needed when out of your home.  - prescription for nebulizer machine - duonebs ampules - 1 ampules (treatment) 3 times a day for 3 days.  You may use your rescue inhaler after these 3 days if needed for shortness of breath\wheezing\chest tightness - pulmicort neb - 1 ampule (treament) 2 times a day for 3 days.  Please resume Symbicort after these 3 days.  - CT Chest with contrast prior to next visit.

## 2014-04-30 ENCOUNTER — Telehealth: Payer: Self-pay | Admitting: Internal Medicine

## 2014-04-30 NOTE — Telephone Encounter (Signed)
Received call from [facility].  They are questioning the DuoNeb and Budesonide prescriptions.    The instructions for the DuoNeb are: (Take 3 mLs by nebulization every 8 (eight) hours as needed. 1 ampule 3 times a day for 3 days.) They need to know if the patient is only taking this medication for 3 days because there were 120 vials ordered.   The instructions for Budesonide are: (Take 0.2 mLs (0.025 mg total) by nebulization 2 (two) times daily. Please use for 3 days then resume Symbicort after 3 days.)  Again, they need to know if patient is only taking this medication for 3 days because there were 30 vials ordered.  They are also questioning the .30mls (0.025) which is only a 10th of a vial and they would like to know if it was meant to say .69ml?    Dr. Stevenson Clinch, please advise.  Thank you.

## 2014-05-03 NOTE — Telephone Encounter (Signed)
Please advise on how pt should take medications.

## 2014-05-03 NOTE — Telephone Encounter (Signed)
Instructions for the DuoNeb are: Take 3 mLs by nebulization every 8 (eight) hours scheduled for 3 days, after 3 days, then only take as needed every 8 hours for cough\sob\wheezing.  Instructions for Budesonide are: Take 0.25mg /75mL by nebulization 2 (two) times daily scheduled for 3 days, then after 3 days resume Symbicort. She can have 30 vials.  The vials are used for when she is having a COPD exacerbation.

## 2014-05-03 NOTE — Telephone Encounter (Signed)
Gave verbal instructions to Leda Gauze at [facility] per Dr. Stevenson Clinch lmtcb x1 for the pt.

## 2014-05-03 NOTE — Telephone Encounter (Signed)
Pt is aware that her prescriptions have been clarified. Nothing further was needed.

## 2014-05-03 NOTE — Telephone Encounter (Signed)
Pt has gotten her nebulizer. Pt has not gotten her nebulizer meds.   [phone]

## 2014-05-04 ENCOUNTER — Ambulatory Visit: Payer: Medicare Other | Admitting: Internal Medicine

## 2014-05-11 ENCOUNTER — Ambulatory Visit (INDEPENDENT_AMBULATORY_CARE_PROVIDER_SITE_OTHER): Payer: Medicare Other | Admitting: *Deleted

## 2014-05-11 DIAGNOSIS — M810 Age-related osteoporosis without current pathological fracture: Secondary | ICD-10-CM | POA: Diagnosis not present

## 2014-05-11 MED ORDER — DENOSUMAB 60 MG/ML ~~LOC~~ SOLN
60.0000 mg | Freq: Once | SUBCUTANEOUS | Status: AC
  Administered 2014-05-11: 60 mg via SUBCUTANEOUS

## 2014-05-19 ENCOUNTER — Telehealth: Payer: Self-pay | Admitting: Internal Medicine

## 2014-05-19 NOTE — Telephone Encounter (Signed)
Confirmation of order signed by Dr. Stevenson Clinch n scanning folder.  Order is for Budesonide and albuterol does this need to be faxed or has it been sent?

## 2014-05-21 NOTE — Telephone Encounter (Signed)
Called Anderson Malta back and let her know that [facility] Certificate of Medical Necessity is scanned into media.

## 2014-05-31 ENCOUNTER — Ambulatory Visit
Admit: 2014-05-31 | Disposition: A | Discharge status: Home / Self Care | Payer: Self-pay | Attending: Internal Medicine | Admitting: Internal Medicine

## 2014-05-31 DIAGNOSIS — J432 Centrilobular emphysema: Secondary | ICD-10-CM | POA: Diagnosis not present

## 2014-05-31 DIAGNOSIS — J449 Chronic obstructive pulmonary disease, unspecified: Secondary | ICD-10-CM | POA: Diagnosis not present

## 2014-05-31 DIAGNOSIS — K449 Diaphragmatic hernia without obstruction or gangrene: Secondary | ICD-10-CM | POA: Diagnosis not present

## 2014-05-31 DIAGNOSIS — K8020 Calculus of gallbladder without cholecystitis without obstruction: Secondary | ICD-10-CM | POA: Diagnosis not present

## 2014-06-15 ENCOUNTER — Ambulatory Visit (INDEPENDENT_AMBULATORY_CARE_PROVIDER_SITE_OTHER): Payer: Medicare Other | Admitting: Internal Medicine

## 2014-06-15 ENCOUNTER — Encounter: Payer: Self-pay | Admitting: Internal Medicine

## 2014-06-15 VITALS — BP 112/70 | HR 87 | Temp 99.5°F | Ht 66.5 in | Wt 147.0 lb

## 2014-06-15 DIAGNOSIS — J432 Centrilobular emphysema: Secondary | ICD-10-CM

## 2014-06-15 DIAGNOSIS — J4541 Moderate persistent asthma with (acute) exacerbation: Secondary | ICD-10-CM

## 2014-06-15 MED ORDER — TIOTROPIUM BROMIDE MONOHYDRATE 2.5 MCG/ACT IN AERS: 2.0000 | INHALATION_SPRAY | Freq: Every day | RESPIRATORY_TRACT | Status: DC

## 2014-06-15 NOTE — Progress Notes (Signed)
MRN# [id number] Dorothy Church Nov 14, 1935   JM:EQASTM up of my breathing. Chief Complaint  Patient presents with  . Follow-up    f/u asthma CT chest. Pt reports breathing unchanged.      Brief History: HPI 04/27/14 She is a pleasant 79 year old female past medical history of asthma presenting today for as optimization. Patient states that over the past 2 months asthma has been hard to control, she is also until she may have COPD. History as stated: In January patient presented to the ER with shortness of breath and cough she was given prednisone and antibiotics and nebulizers at that time and discharged home she improved after that however in March she noted to have increased shortness of breath fatigue chest tightness and wheezing, shortness of PMD on March 7 and was given 2 nebulizer treatments with improvement along with prescription for antibiotics and steroids. Currently she denies any fever or chills. Patient states she had asthma during childhood where she had frequent hospitalizations but no intubations. About 6 years ago she was told her asthma had returned due to increasing shortness of breath and some subjective chest congestion with possible upper respiratory tract infections. Patient states her breathing is trouble with certain triggers such as mold, dust, damp areas. She was told about 45 years ago she had COPD and was also placed on Symbicort at that time along with as needed albuterol. Currently patient states that she still feels fatigue with chest congestion and some wheezing however she has no recent sick contact exposure. 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 She has a history of obstructive sleep apnea diagnosed 8 years ago and she's currently on CPAP; she doesn't recall her CPAP settings. Patient states she never smoked tobacco had any secondhand exposure. 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 Plan: nebs, repeat CT   Events since last clinic visit: Patient presents today for a follow up visit, doing well today. Back to using symbicort. Had a CT Chest since last visit. Only using rescue inhaler about 1 time a week since last visit.    PMHX:   Past Medical History  Diagnosis Date  . Intrinsic asthma, unspecified   . Aortic valve disorders   . Obstructive sleep apnea (adult) (pediatric)   . Vitamin D deficiency   . Barrett's esophagus   . Impaired fasting glucose   . COPD (chronic obstructive pulmonary disease)   . Syncope and collapse   . Hypothyroidism   . Osteoporosis    Surgical Hx:  Past Surgical History  Procedure Laterality Date  . Replacement unicondylar joint knee    . Hip surgery    . Colonoscopy    . Eye surgery    . Prosthesis eye implant    . Tonsillectomy     Family Hx:  Family History  Problem Relation Age of Onset  . Heart disease Father   . Bipolar disorder Brother    Social Hx:   History  Substance Use Topics  . Smoking status: Never Smoker   . Smokeless tobacco: Never Used  . Alcohol Use: Yes     Comment: occas. wine   Medication:   Current Outpatient Rx  Name  Route  Sig  Dispense  Refill  . albuterol (VENTOLIN HFA) 108 (90 BASE) MCG/ACT inhaler   Inhalation   Inhale 2 puffs into the lungs every 6 (six) hours  as needed for wheezing.         Marland Kitchen aspirin 81 MG tablet   Oral   Take 81 mg by mouth daily.         . budesonide (PULMICORT) 0.25 MG/2ML nebulizer solution   Nebulization   Take 0.2 mLs (0.025 mg total) by nebulization 2 (two) times daily. Please use for 3 days then resume Symbicort after 3 days.   60 mL   3   . budesonide-formoterol (SYMBICORT) 160-4.5 MCG/ACT inhaler   Inhalation   Inhale 2 puffs into the lungs daily as needed. Patient taking differently: Inhale 2 puffs into the lungs daily.    1 Inhaler   1   . calcium carbonate (OS-CAL) 600 MG  TABS   Oral   Take 600 mg by mouth daily.         Marland Kitchen denosumab (PROLIA) 60 MG/ML SOLN injection   Subcutaneous   Inject 60 mg into the skin every 6 (six) months. Administer in upper arm, thigh, or abdomen         . esomeprazole (NEXIUM) 40 MG capsule   Oral   Take 40 mg by mouth daily before breakfast.         . ipratropium-albuterol (DUONEB) 0.5-2.5 (3) MG/3ML SOLN   Nebulization   Take 3 mLs by nebulization every 8 (eight) hours as needed. 1 ampule 3 times a day for 3 days.   360 mL   3   . levothyroxine (SYNTHROID, LEVOTHROID) 50 MCG tablet      Take 1 tablet (50 mcg  total) by mouth daily  before breakfast.   90 tablet   1   . Multiple Vitamin (MULTIVITAMIN) tablet   Oral   Take 1 tablet by mouth daily.         Marland Kitchen PARoxetine (PAXIL) 30 MG tablet      Take 1 tablet by mouth  every morning   90 tablet   1   . simvastatin (ZOCOR) 40 MG tablet   Oral   Take 1 tablet (40 mg total) by mouth daily at 6 PM. NEED TO MAKE APPOINTMENT FOR ANNUAL PHYSICAL   90 tablet   0      Review of Systems: Gen:  Denies  fever, sweats, chills HEENT: Denies blurred vision, double vision, ear pain, eye pain, hearing loss, nose bleeds, sore throat Cvc:  No dizziness, chest pain or heaviness Resp:   Chronic shortness of breath Gi: Denies swallowing difficulty, stomach pain, nausea or vomiting, diarrhea, constipation, bowel incontinence Gu:  Denies bladder incontinence, burning urine Ext:   No Joint pain, stiffness or swelling Skin: No skin rash, easy bruising or bleeding or hives Endoc:  No polyuria, polydipsia , polyphagia or weight change Psych: No depression, insomnia or hallucinations  Other:  All other systems negative  Allergies:  Amoxicillin; Cefzil; Cephalexin; Propoxyphene; Relafen; and Talwin  Physical Examination:  VS: BP 112/70 mmHg  Pulse 87  Temp(Src) 99.5 F (37.5 C) (Oral)  Ht 5' 6.5" (1.689 m)  Wt 147 lb (66.679 kg)  BMI 23.37 kg/m2  SpO2 93%  General  Appearance: No distress  Neuro: EXAM: without focal findings, mental status, speech normal, alert and oriented, cranial nerves 2-12 grossly normal  HEENT: PERRLA, EOM intact, no ptosis, no other lesions noticed Pulmonary:Exam: normal breath sounds., diaphragmatic excursion normal.No wheezing, No rales   Cardiovascular:@ Exam:  Normal S1,S2.  No m/r/g.     Abdomen:Exam: Benign, Soft, non-tender, No masses  Skin:   warm,  no rashes, no ecchymosis  Extremities: normal, no cyanosis, clubbing, no edema, warm with normal capillary refill.   Labs results:  BMP Lab Results  Component Value Date   NA 139 04/23/2013   K 3.9 04/23/2013   CL 105 04/23/2013   CO2 28 04/23/2013   GLUCOSE 87 04/23/2013   BUN 14 04/23/2013   CREATININE 0.7 04/23/2013     CBC CBC Latest Ref Rng 04/23/2013 02/26/2011  WBC 4.5 - 10.5 K/uL 4.1(L) 6.8  Hemoglobin 12.0 - 15.0 g/dL 13.3 14.5  Hematocrit 36.0 - 46.0 % 39.7 43.5  Platelets 150.0 - 400.0 K/uL 209.0 208     Rad results: (The following images and results were reviewed by Dr. Stevenson Clinch). 05/31/14 CT CHEST WITH CONTRAST  TECHNIQUE: Multidetector CT imaging of the chest was performed during intravenous contrast administration.  CONTRAST:  75 cc Omnipaque 300  COMPARISON:  Chest radiograph of 12/30/2014.  FINDINGS: Mediastinum/Nodes: Aortic atherosclerosis. Normal heart size, without pericardial effusion. No mediastinal or hilar adenopathy. Small hiatal hernia.  Subtle fluid level in the thoracic esophagus on image 21.  Lungs/Pleura: No pleural fluid. Mild centrilobular emphysema. Mild volume loss within the lingula with left base scarring.  Upper abdomen: Cholelithiasis. Normal imaged portions of the liver, spleen, pancreas, adrenal glands. Too small to characterize lesion in the interpolar right kidney. Normal imaged left kidney.  Musculoskeletal: No acute osseous abnormality.   IMPRESSION: 1.  No acute process in the chest. 2. Mild  centrilobular emphysema. 3. Small hiatal hernia. Esophageal air fluid level suggests dysmotility or gastroesophageal reflux. 4. Cholelithiasis.     Assessment and Plan:79 year old female past medical history of COPD/asthma, seen in followup today for optimization of her obstructive lung disease. COPD (chronic obstructive pulmonary disease) Patient with a recent upper respiratory tract infection/bronchitis she is currently on a steroid taper and antibiotics prescribed by her primary care physician Review of recent chest x-ray does show hyperinflation however cannot appreciate any emphysematous or bronchial thickening based on chest x-ray. She does not have a history of secondhand smoke exposure or chronic tobacco use, these are the most common factors predisposing a patient of COPD however nonsmokers can also develop COPD for later in life.  In any event given her asthma history and her current clinical status with hyperinflation on chest x-ray she should be treated as asthma/COPD or overlap syndrome (asthma plus COPD). I reviewed her imaging studies and noted that CT of abdomen and pelvis in July of 2013, the lung windows, there may be some mild subpleural changes at the bases by my review. Most recent CT scan of the chest showed mild centrilobular emphysema with a small hiatal hernia, esophageal air-fluid levels suggest this motility or gastroesophageal reflux.   Plan: - pulmonary function testing and 6 minute walk test prior to follow up - Spiriva Respimat (2.18mcg) - 2 puff once a day - Patient given a sample - if having improvement by week 2, then patient will call us and we can fill this prescription - continue with Symbicort - continue with albuterol rescue inhaler - 2puff every 3-4 hours as needed for shortness of breath\wheezing\recurrent cough - a followup visit will consider further evaluation with GI referral if patient is still symptomatic with shortness of breath and  cough    Asthma without acute exacerbation Patient with history of childhood asthma, outgrew that and it returned about 6 years ago. Currently she may have an overlap syndrome of asthma and COPD in any event treatment is uniform and the same  for both.  Plan: See plan for COPD.       Updated Medication List Outpatient Encounter Prescriptions as of 06/15/2014  Medication Sig  . albuterol (VENTOLIN HFA) 108 (90 BASE) MCG/ACT inhaler Inhale 2 puffs into the lungs every 6 (six) hours as needed for wheezing.  Marland Kitchen aspirin 81 MG tablet Take 81 mg by mouth daily.  . budesonide (PULMICORT) 0.25 MG/2ML nebulizer solution Take 0.2 mLs (0.025 mg total) by nebulization 2 (two) times daily. Please use for 3 days then resume Symbicort after 3 days.  . budesonide-formoterol (SYMBICORT) 160-4.5 MCG/ACT inhaler Inhale 2 puffs into the lungs daily as needed. (Patient taking differently: Inhale 2 puffs into the lungs daily. )  . calcium carbonate (OS-CAL) 600 MG TABS Take 600 mg by mouth daily.  Marland Kitchen denosumab (PROLIA) 60 MG/ML SOLN injection Inject 60 mg into the skin every 6 (six) months. Administer in upper arm, thigh, or abdomen  . esomeprazole (NEXIUM) 40 MG capsule Take 40 mg by mouth daily before breakfast.  . ipratropium-albuterol (DUONEB) 0.5-2.5 (3) MG/3ML SOLN Take 3 mLs by nebulization every 8 (eight) hours as needed. 1 ampule 3 times a day for 3 days.  Marland Kitchen levothyroxine (SYNTHROID, LEVOTHROID) 50 MCG tablet Take 1 tablet (50 mcg  total) by mouth daily  before breakfast.  . Multiple Vitamin (MULTIVITAMIN) tablet Take 1 tablet by mouth daily.  Marland Kitchen PARoxetine (PAXIL) 30 MG tablet Take 1 tablet by mouth  every morning  . simvastatin (ZOCOR) 40 MG tablet Take 1 tablet (40 mg total) by mouth daily at 6 PM. NEED TO MAKE APPOINTMENT FOR ANNUAL PHYSICAL  . [DISCONTINUED] azithromycin (ZITHROMAX Z-PAK) 250 MG tablet Two on day 1 followed by one daily for 4 days for total of 5 days, PO (Patient not taking: Reported  on 06/15/2014)  . [DISCONTINUED] predniSONE (DELTASONE) 20 MG tablet Take two tablets daily for 3 days followed by one tablet daily for 4 days (Patient not taking: Reported on 06/15/2014)    Orders for this visit: No orders of the defined types were placed in this encounter.    Thank  you for the visitation and for allowing  [facility] to assist in the care of your patient. Our recommendations are noted above.  Please contact us if we can be of further service.  Vilinda Boehringer, MD [facility] Office Number: [phone]

## 2014-06-15 NOTE — Patient Instructions (Addendum)
Follow up with Dr. Stevenson Clinch in 2 months - pulmonary function testing and 6 minute walk test prior to follow up - Spiriva Respimat (2.25mcg) - 2 puff once a day - you were given a sample - if having improvement by week 2 then call us and we will call in a rx for you. - continue with Symbicort - continue with albuterol rescue inhaler - 2puff every 3-4 hours as needed for shortness of breath\wheezing\recurrent cough

## 2014-06-16 NOTE — Assessment & Plan Note (Signed)
Patient with history of childhood asthma, outgrew that and it returned about 6 years ago. Currently she may have an overlap syndrome of asthma and COPD in any event treatment is uniform and the same for both.  Plan: See plan for COPD.

## 2014-06-16 NOTE — Assessment & Plan Note (Addendum)
Patient with a recent upper respiratory tract infection/bronchitis she is currently on a steroid taper and antibiotics prescribed by her primary care physician Review of recent chest x-ray does show hyperinflation however cannot appreciate any emphysematous or bronchial thickening based on chest x-ray. She does not have a history of secondhand smoke exposure or chronic tobacco use, these are the most common factors predisposing a patient of COPD however nonsmokers can also develop COPD for later in life.  In any event given her asthma history and her current clinical status with hyperinflation on chest x-ray she should be treated as asthma/COPD or overlap syndrome (asthma plus COPD). I reviewed her imaging studies and noted that CT of abdomen and pelvis in July of 2013, the lung windows, there may be some mild subpleural changes at the bases by my review. Most recent CT scan of the chest showed mild centrilobular emphysema with a small hiatal hernia, esophageal air-fluid levels suggest this motility or gastroesophageal reflux.   Plan: - pulmonary function testing and 6 minute walk test prior to follow up - Spiriva Respimat (2.27mcg) - 2 puff once a day - Patient given a sample - if having improvement by week 2, then patient will call us and we can fill this prescription - continue with Symbicort - continue with albuterol rescue inhaler - 2puff every 3-4 hours as needed for shortness of breath\wheezing\recurrent cough - a followup visit will consider further evaluation with GI referral if patient is still symptomatic with shortness of breath and cough

## 2014-07-07 ENCOUNTER — Telehealth: Payer: Self-pay | Admitting: Internal Medicine

## 2014-07-07 MED ORDER — TIOTROPIUM BROMIDE MONOHYDRATE 2.5 MCG/ACT IN AERS: 2.0000 | INHALATION_SPRAY | Freq: Every day | RESPIRATORY_TRACT | Status: DC

## 2014-07-07 NOTE — Telephone Encounter (Signed)
Pt returned call - [phone]. Can leave her message if she doesn't answer.

## 2014-07-07 NOTE — Telephone Encounter (Signed)
Per 06/15/14 OV w/ Dr. Stevenson Clinch: Patient Instructions       Follow up with Dr. Stevenson Clinch in 2 months - pulmonary function testing and 6 minute walk test prior to follow up - Spiriva Respimat (2.7mcg) - 2 puff once a day - you were given a sample - if having improvement by week 2 then call us and we will call in a rx for you. - continue with Symbicort - continue with albuterol rescue inhaler - 2puff every 3-4 hours as needed for shortness of breath\wheezing\recurrent cough  --  Called spoke with pt. She reports the spiriva resp has improved her breathing and requesting RX to be sent into [facility] for 90 day supply. I have done so. Will forward to Dr. Stevenson Clinch as an Juluis Rainier

## 2014-07-07 NOTE — Telephone Encounter (Signed)
lmtcb x1 for pt. 

## 2014-07-08 DIAGNOSIS — H40001 Preglaucoma, unspecified, right eye: Secondary | ICD-10-CM | POA: Diagnosis not present

## 2014-08-02 ENCOUNTER — Other Ambulatory Visit: Payer: Self-pay | Admitting: *Deleted

## 2014-08-02 MED ORDER — LEVOTHYROXINE SODIUM 50 MCG PO TABS: ORAL_TABLET | ORAL | Status: DC

## 2014-08-10 ENCOUNTER — Encounter: Payer: Self-pay | Admitting: Family Medicine

## 2014-08-12 ENCOUNTER — Ambulatory Visit (INDEPENDENT_AMBULATORY_CARE_PROVIDER_SITE_OTHER): Payer: Medicare Other | Admitting: Family Medicine

## 2014-08-12 ENCOUNTER — Encounter: Payer: Self-pay | Admitting: Family Medicine

## 2014-08-12 VITALS — BP 110/72 | HR 71 | Temp 98.0°F | Wt 145.5 lb

## 2014-08-12 DIAGNOSIS — T63441A Toxic effect of venom of bees, accidental (unintentional), initial encounter: Secondary | ICD-10-CM | POA: Diagnosis not present

## 2014-08-12 DIAGNOSIS — T63451A Toxic effect of venom of hornets, accidental (unintentional), initial encounter: Secondary | ICD-10-CM

## 2014-08-12 DIAGNOSIS — T63461A Toxic effect of venom of wasps, accidental (unintentional), initial encounter: Secondary | ICD-10-CM

## 2014-08-12 NOTE — Assessment & Plan Note (Signed)
Anticipate local reaction to wasp sting in pt without allergy. No evidence of systemic infection. Treat supportively with antihistamine for swelling, ibuprofen for discomfort, and cool compresses. Discussed reasons to seek urgent care. Update if not improving with treatment as expected.
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
## 2014-08-12 NOTE — Patient Instructions (Signed)
I'm sorry you got stung! I don't see any systemic reaction taking place, just local swelling and pain from stings. Treat with cool compresses over next 24-48 hours. Take ibuprofen 400mg  up to three times daily with food for discomfort as needed and may take benadryl 25mg  or zyrtec 10mg  as needed for any swelling that may develop. Seek urgent care if swelling of throat or tongue or lips or any trouble breathing. Let us know right away if fever or redness or pus develop.

## 2014-08-12 NOTE — Progress Notes (Signed)
Pre visit review using our clinic review tool, if applicable. No additional management support is needed unless otherwise documented below in the visit note. 

## 2014-08-16 ENCOUNTER — Ambulatory Visit (INDEPENDENT_AMBULATORY_CARE_PROVIDER_SITE_OTHER): Payer: Medicare Other | Admitting: Internal Medicine

## 2014-08-16 ENCOUNTER — Telehealth: Payer: Self-pay | Admitting: *Deleted

## 2014-08-16 ENCOUNTER — Encounter: Payer: Self-pay | Admitting: Internal Medicine

## 2014-08-16 ENCOUNTER — Other Ambulatory Visit: Payer: Self-pay | Admitting: *Deleted

## 2014-08-16 VITALS — BP 124/80 | HR 86 | Ht 67.0 in | Wt 144.0 lb

## 2014-08-16 DIAGNOSIS — J432 Centrilobular emphysema: Secondary | ICD-10-CM

## 2014-08-16 LAB — PULMONARY FUNCTION TEST
DL/VA % PRED: 82 %
DL/VA: 4.23 ml/min/mmHg/L
DLCO UNC: 18.75 ml/min/mmHg
DLCO unc % pred: 66 %
FEF 25-75 POST: 1.33 L/s
FEF 25-75 Pre: 0.47 L/sec
FEF2575-%CHANGE-POST: 182 %
FEF2575-%PRED-PRE: 26 %
FEF2575-%Pred-Post: 75 %
FEV1-%Change-Post: 56 %
FEV1-%Pred-Post: 62 %
FEV1-%Pred-Pre: 39 %
FEV1-PRE: 0.93 L
FEV1-Post: 1.46 L
FEV1FVC-%Change-Post: 20 %
FEV1FVC-%Pred-Pre: 68 %
FEV6-%CHANGE-POST: 30 %
FEV6-%Pred-Post: 78 %
FEV6-%Pred-Pre: 60 %
FEV6-POST: 2.34 L
FEV6-Pre: 1.8 L
FEV6FVC-%Change-Post: 0 %
FEV6FVC-%Pred-Post: 104 %
FEV6FVC-%Pred-Pre: 104 %
FVC-%CHANGE-POST: 30 %
FVC-%PRED-POST: 75 %
FVC-%PRED-PRE: 57 %
FVC-Post: 2.36 L
FVC-Pre: 1.81 L
POST FEV1/FVC RATIO: 62 %
PRE FEV1/FVC RATIO: 51 %
Post FEV6/FVC ratio: 99 %
Pre FEV6/FVC Ratio: 100 %
RV % pred: 143 %
RV: 3.54 L
TLC % pred: 100 %
TLC: 5.52 L

## 2014-08-16 MED ORDER — TIOTROPIUM BROMIDE MONOHYDRATE 2.5 MCG/ACT IN AERS: 2.0000 | INHALATION_SPRAY | Freq: Every day | RESPIRATORY_TRACT | Status: DC

## 2014-08-16 NOTE — Addendum Note (Signed)
Addended by: Devona Konig on: 08/16/2014 05:32 PM   Modules accepted: Orders

## 2014-08-16 NOTE — Addendum Note (Signed)
Addended by: Devona Konig on: 08/16/2014 05:09 PM   Modules accepted: Orders

## 2014-08-16 NOTE — Progress Notes (Signed)
MRN# [id number] Dorothy Church 07/09/1935   CC: Chief Complaint  Patient presents with  . Follow-up    SMW/PFT results; still having extreme SOB;      Brief History: HPI 04/27/14 She is a pleasant 79 year old female past medical history of asthma presenting today for as optimization. Patient states that over the past 2 months asthma has been hard to control, she is also until she may have COPD. History as stated: In January patient presented to the ER with shortness of breath and cough she was given prednisone and antibiotics and nebulizers at that time and discharged home she improved after that however in March she noted to have increased shortness of breath fatigue chest tightness and wheezing, shortness of PMD on March 7 and was given 2 nebulizer treatments with improvement along with prescription for antibiotics and steroids. Currently she denies any fever or chills. Patient states she had asthma during childhood where she had frequent hospitalizations but no intubations. About 6 years ago she was told her asthma had returned due to increasing shortness of breath and some subjective chest congestion with possible upper respiratory tract infections. Patient states her breathing is trouble with certain triggers such as mold, dust, damp areas. She was told about 45 years ago she had COPD and was also placed on Symbicort at that time along with as needed albuterol. Currently patient states that she still feels fatigue with chest congestion and some wheezing however she has no recent sick contact exposure. 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 She has a history of obstructive sleep apnea diagnosed 8 years ago and she's currently on CPAP; she doesn't recall her CPAP settings. Patient states she never smoked tobacco had any secondhand exposure. 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 Plan: nebs, repeat CT   ROV 06/17/14 Patient presents today for a follow up visit, doing well today. Back to using symbicort. Had a CT Chest since last visit. Only using rescue inhaler about 1 time a week since last visit.   Plan: - pulmonary function testing and 6 minute walk test prior to follow up - Spiriva Respimat (2.24mcg) - 2 puff once a day - Patient given a sample - if having improvement by week 2, then patient will call us and we can fill this prescription - continue with Symbicort - continue with albuterol rescue inhaler - 2puff every 3-4 hours as needed for shortness of breath\wheezing\recurrent cough - a followup visit will consider further evaluation with GI referral if patient is still symptomatic with shortness of breath and cough  Events since last clinic visit: Patient presents for follow up visit of COPD along with PFTs and 6 MWT  Did not call in refill for spiriva, but stated that the 2 week trial was helpful Using Symbicort as directed.   Patient states overall she is doing well except she has shortness of breath mainly with exertion. No cough, no wheeze, no chest tightness.    Medication:   Current Outpatient Rx  Name  Route  Sig  Dispense  Refill  . albuterol (VENTOLIN HFA) 108 (90 BASE) MCG/ACT inhaler   Inhalation   Inhale 2 puffs into the lungs every 6 (six) hours as needed for wheezing.         Marland Kitchen aspirin 81 MG tablet   Oral   Take 81 mg by mouth daily.         Marland Kitchen  budesonide-formoterol (SYMBICORT) 160-4.5 MCG/ACT inhaler   Inhalation   Inhale 2 puffs into the lungs daily as needed. Patient taking differently: Inhale 2 puffs into the lungs daily.    1 Inhaler   1   . calcium carbonate (OS-CAL) 600 MG TABS   Oral   Take 600 mg by mouth daily.         Marland Kitchen denosumab (PROLIA) 60 MG/ML SOLN injection   Subcutaneous   Inject 60 mg into the skin every 6 (six) months. Administer in upper arm, thigh, or abdomen         .  esomeprazole (NEXIUM) 40 MG capsule   Oral   Take 40 mg by mouth daily before breakfast.         . ipratropium-albuterol (DUONEB) 0.5-2.5 (3) MG/3ML SOLN   Nebulization   Take 3 mLs by nebulization every 8 (eight) hours as needed. 1 ampule 3 times a day for 3 days.   360 mL   3   . levothyroxine (SYNTHROID, LEVOTHROID) 50 MCG tablet      Take 1 tablet (50 mcg  total) by mouth daily  before breakfast.   30 tablet   0     Office visit with labs required for additional ref ...   . Multiple Vitamin (MULTIVITAMIN) tablet   Oral   Take 1 tablet by mouth daily.         Marland Kitchen PARoxetine (PAXIL) 30 MG tablet      Take 1 tablet by mouth  every morning   90 tablet   1   . simvastatin (ZOCOR) 40 MG tablet   Oral   Take 1 tablet (40 mg total) by mouth daily at 6 PM. NEED TO MAKE APPOINTMENT FOR ANNUAL PHYSICAL   90 tablet   0   . Tiotropium Bromide Monohydrate (SPIRIVA RESPIMAT) 2.5 MCG/ACT AERS   Inhalation   Inhale 2 puffs into the lungs daily. Gargle and rinse after each use.   3 Inhaler   2      Review of Systems: Gen:  Denies  fever, sweats, chills HEENT: Denies blurred vision, double vision, ear pain, eye pain, hearing loss, nose bleeds, sore throat Cvc:  No dizziness, chest pain or heaviness Resp:   Admits XN:ATFTDDU on exertion  Gi: Denies swallowing difficulty, stomach pain, nausea or vomiting, diarrhea, constipation, bowel incontinence Gu:  Denies bladder incontinence, burning urine Ext:   No Joint pain, stiffness or swelling Skin: No skin rash, easy bruising or bleeding or hives Endoc:  No polyuria, polydipsia , polyphagia or weight change Other:  All other systems negative  Allergies:  Amoxicillin; Cefzil; Cephalexin; Propoxyphene; Relafen; and Talwin  Physical Examination:  VS: BP 124/80 mmHg  Pulse 86  Ht 5\' 7"  (1.702 m)  Wt 144 lb (65.318 kg)  BMI 22.55 kg/m2  SpO2 94%  General Appearance: No distress  HEENT: PERRLA, no ptosis, no other lesions  noticed Pulmonary:good respiratory effort, mild fine dry crackles at the bases (baseline), no wheezes.  Cardiovascular:  Normal S1,S2.  No m/r/g.     Abdomen:Exam: Benign, Soft, non-tender, No masses  Skin:   warm, no rashes, no ecchymosis  Extremities: normal, no cyanosis, clubbing, warm with normal capillary refill.     Pulmonary function testing 08/16/2014 FEV1 data percent FEV1/FVC 51% RV 143 FRC a percent RV/TLC 119 TLC 80 Impression: Severe obstruction with significant bronchodilation response, air trapping noted, moderate reduction in ERV, moderate decrease in DLCO (66%). 6 minute walk test: 1102 feet/336 m, low  saturation 94%, highest heart rate 106.  Assessment and Plan: 79 year old female presenting today for follow-up visit of COPD. COPD (chronic obstructive pulmonary disease) She does not have a history of secondhand smoke exposure or chronic tobacco use, these are the most common factors predisposing a patient of COPD however nonsmokers can also develop COPD for later in life.  In any event given her asthma history and her current clinical status with hyperinflation on chest x-ray she should be treated as asthma/COPD or overlap syndrome (asthma plus COPD). I reviewed her imaging studies and noted that CT of abdomen and pelvis in July of 2013, the lung windows, there may be some mild subpleural changes at the bases by my review. Most recent CT scan of the chest showed mild centrilobular emphysema with a small hiatal hernia, esophageal air-fluid levels suggest this motility or gastroesophageal reflux.  Pulmonary function testing done today shows severe obstruction with significant bronchodilation response and air trapping. Given an FEV1 of 39% with symptoms of severe dyspnea on exertion, I believe the patient will benefit from Spiriva and Symbicort and referral to pulmonary rehabilitation  Plan: - Spiriva Respimat (2.22mcg) - 2 puff once a day - Patient given a sample -  continue with Symbicort - continue with albuterol rescue inhaler - 2puff every 3-4 hours as needed for shortness of breath\wheezing\recurrent cough - Referral to pulmonary rehabilitation      Updated Medication List Outpatient Encounter Prescriptions as of 08/16/2014  Medication Sig  . albuterol (VENTOLIN HFA) 108 (90 BASE) MCG/ACT inhaler Inhale 2 puffs into the lungs every 6 (six) hours as needed for wheezing.  Marland Kitchen aspirin 81 MG tablet Take 81 mg by mouth daily.  . budesonide-formoterol (SYMBICORT) 160-4.5 MCG/ACT inhaler Inhale 2 puffs into the lungs daily as needed. (Patient taking differently: Inhale 2 puffs into the lungs daily. )  . calcium carbonate (OS-CAL) 600 MG TABS Take 600 mg by mouth daily.  Marland Kitchen denosumab (PROLIA) 60 MG/ML SOLN injection Inject 60 mg into the skin every 6 (six) months. Administer in upper arm, thigh, or abdomen  . esomeprazole (NEXIUM) 40 MG capsule Take 40 mg by mouth daily before breakfast.  . ipratropium-albuterol (DUONEB) 0.5-2.5 (3) MG/3ML SOLN Take 3 mLs by nebulization every 8 (eight) hours as needed. 1 ampule 3 times a day for 3 days.  Marland Kitchen levothyroxine (SYNTHROID, LEVOTHROID) 50 MCG tablet Take 1 tablet (50 mcg  total) by mouth daily  before breakfast.  . Multiple Vitamin (MULTIVITAMIN) tablet Take 1 tablet by mouth daily.  Marland Kitchen PARoxetine (PAXIL) 30 MG tablet Take 1 tablet by mouth  every morning  . simvastatin (ZOCOR) 40 MG tablet Take 1 tablet (40 mg total) by mouth daily at 6 PM. NEED TO MAKE APPOINTMENT FOR ANNUAL PHYSICAL  . Tiotropium Bromide Monohydrate (SPIRIVA RESPIMAT) 2.5 MCG/ACT AERS Inhale 2 puffs into the lungs daily. Gargle and rinse after each use.   No facility-administered encounter medications on file as of 08/16/2014.    Orders for this visit: No orders of the defined types were placed in this encounter.    Thank  you for the visitation and for allowing  [facility] to assist in the care of your patient. Our  recommendations are noted above.  Please contact us if we can be of further service.  Vilinda Boehringer, MD [facility] Office Number: [phone]

## 2014-08-16 NOTE — Progress Notes (Signed)
PFT performed today. 

## 2014-08-16 NOTE — Telephone Encounter (Signed)
PFT order placed

## 2014-08-16 NOTE — Patient Instructions (Signed)
Follow up with in Dr. Stevenson Clinch 3 months - cont with Symbicort - gargle and rinse after each use.  - we will refill your Spriva respimat - 2 puff daily in the AM - gargle and rinse after each use.  - referral to pulmonary rehab - we will check alpha-1-antitrypsin prior to your follow up visit.

## 2014-08-16 NOTE — Assessment & Plan Note (Addendum)
She does not have a history of secondhand smoke exposure or chronic tobacco use, these are the most common factors predisposing a patient of COPD however nonsmokers can also develop COPD for later in life.  In any event given her asthma history and her current clinical status with hyperinflation on chest x-ray she should be treated as asthma/COPD or overlap syndrome (asthma plus COPD). I reviewed her imaging studies and noted that CT of abdomen and pelvis in July of 2013, the lung windows, there may be some mild subpleural changes at the bases by my review. Most recent CT scan of the chest showed mild centrilobular emphysema with a small hiatal hernia, esophageal air-fluid levels suggest this motility or gastroesophageal reflux.  Pulmonary function testing done today shows severe obstruction with significant bronchodilation response and air trapping. Given an FEV1 of 39% with symptoms of severe dyspnea on exertion, I believe the patient will benefit from Spiriva and Symbicort and referral to pulmonary rehabilitation  Plan: - Spiriva Respimat (2.68mcg) - 2 puff once a day - Patient given a sample - continue with Symbicort - continue with albuterol rescue inhaler - 2puff every 3-4 hours as needed for shortness of breath\wheezing\recurrent cough - Referral to pulmonary rehabilitation

## 2014-08-16 NOTE — Progress Notes (Signed)
SMW performed today. 

## 2014-09-02 ENCOUNTER — Ambulatory Visit (INDEPENDENT_AMBULATORY_CARE_PROVIDER_SITE_OTHER): Payer: Medicare Other | Admitting: Family Medicine

## 2014-09-02 ENCOUNTER — Encounter: Payer: Self-pay | Admitting: Family Medicine

## 2014-09-02 ENCOUNTER — Other Ambulatory Visit: Payer: Self-pay | Admitting: Family Medicine

## 2014-09-02 VITALS — BP 128/72 | HR 78 | Temp 98.0°F | Ht 66.25 in | Wt 144.5 lb

## 2014-09-02 DIAGNOSIS — E039 Hypothyroidism, unspecified: Secondary | ICD-10-CM | POA: Diagnosis not present

## 2014-09-02 DIAGNOSIS — M810 Age-related osteoporosis without current pathological fracture: Secondary | ICD-10-CM | POA: Diagnosis not present

## 2014-09-02 DIAGNOSIS — Z86010 Personal history of colonic polyps: Secondary | ICD-10-CM

## 2014-09-02 DIAGNOSIS — E785 Hyperlipidemia, unspecified: Secondary | ICD-10-CM | POA: Diagnosis not present

## 2014-09-02 DIAGNOSIS — J4530 Mild persistent asthma, uncomplicated: Secondary | ICD-10-CM

## 2014-09-02 DIAGNOSIS — J432 Centrilobular emphysema: Secondary | ICD-10-CM | POA: Diagnosis not present

## 2014-09-02 DIAGNOSIS — R94120 Abnormal auditory function study: Secondary | ICD-10-CM

## 2014-09-02 DIAGNOSIS — G4733 Obstructive sleep apnea (adult) (pediatric): Secondary | ICD-10-CM | POA: Diagnosis not present

## 2014-09-02 DIAGNOSIS — Z0000 Encounter for general adult medical examination without abnormal findings: Secondary | ICD-10-CM | POA: Diagnosis not present

## 2014-09-02 DIAGNOSIS — Z9989 Dependence on other enabling machines and devices: Secondary | ICD-10-CM

## 2014-09-02 LAB — COMPREHENSIVE METABOLIC PANEL
ALBUMIN: 4.1 g/dL (ref 3.5–5.2)
ALT: 13 U/L (ref 0–35)
AST: 17 U/L (ref 0–37)
Alkaline Phosphatase: 36 U/L — ABNORMAL LOW (ref 39–117)
BUN: 12 mg/dL (ref 6–23)
CO2: 33 meq/L — AB (ref 19–32)
Calcium: 9.2 mg/dL (ref 8.4–10.5)
Chloride: 107 mEq/L (ref 96–112)
Creatinine, Ser: 0.81 mg/dL (ref 0.40–1.20)
GFR: 72.92 mL/min (ref >60.00)
Glucose, Bld: 86 mg/dL (ref 70–99)
Potassium: 4.7 mEq/L (ref 3.5–5.1)
SODIUM: 142 meq/L (ref 135–145)
TOTAL PROTEIN: 6.6 g/dL (ref 6.0–8.3)
Total Bilirubin: 0.4 mg/dL (ref 0.2–1.2)

## 2014-09-02 LAB — CBC WITH DIFFERENTIAL/PLATELET
BASOS ABS: 0 10*3/uL (ref 0.0–0.1)
Basophils Relative: 0.3 % (ref 0.0–3.0)
Eosinophils Absolute: 0.2 10*3/uL (ref 0.0–0.7)
Eosinophils Relative: 2.9 % (ref 0.0–5.0)
HCT: 42.2 % (ref 36.0–46.0)
Hemoglobin: 14 g/dL (ref 12.0–15.0)
LYMPHS ABS: 1.6 10*3/uL (ref 0.7–4.0)
Lymphocytes Relative: 29.1 % (ref 12.0–46.0)
MCHC: 33.2 g/dL (ref 30.0–36.0)
MCV: 90.8 fl (ref 78.0–100.0)
MONO ABS: 0.6 10*3/uL (ref 0.1–1.0)
Monocytes Relative: 10.5 % (ref 3.0–12.0)
NEUTROS PCT: 57.2 % (ref 43.0–77.0)
Neutro Abs: 3.1 10*3/uL (ref 1.4–7.7)
Platelets: 222 10*3/uL (ref 150.0–400.0)
RBC: 4.64 Mil/uL (ref 3.87–5.11)
RDW: 13.2 % (ref 11.5–15.5)
WBC: 5.5 10*3/uL (ref 4.0–10.5)

## 2014-09-02 LAB — LIPID PANEL
CHOLESTEROL: 175 mg/dL (ref 0–200)
HDL: 63.8 mg/dL (ref >39.00)
LDL Cholesterol: 93 mg/dL (ref 0–99)
NonHDL: 111.2
Total CHOL/HDL Ratio: 3
Triglycerides: 89 mg/dL (ref 0.0–149.0)
VLDL: 17.8 mg/dL (ref 0.0–40.0)

## 2014-09-02 LAB — TSH: TSH: 1.49 u[IU]/mL (ref 0.35–4.50)

## 2014-09-02 MED ORDER — LEVOTHYROXINE SODIUM 50 MCG PO TABS: ORAL_TABLET | ORAL | Status: DC

## 2014-09-02 MED ORDER — ALBUTEROL SULFATE HFA 108 (90 BASE) MCG/ACT IN AERS: 2.0000 | INHALATION_SPRAY | Freq: Four times a day (QID) | RESPIRATORY_TRACT | Status: DC | PRN

## 2014-09-02 MED ORDER — SIMVASTATIN 40 MG PO TABS: 40.0000 mg | ORAL_TABLET | Freq: Every day | ORAL | Status: DC

## 2014-09-02 MED ORDER — PAROXETINE HCL 30 MG PO TABS: ORAL_TABLET | ORAL | Status: DC

## 2014-09-02 NOTE — Addendum Note (Signed)
Addended by: Carter Kitten on: 09/02/2014 03:41 PM   Modules accepted: Orders

## 2014-09-02 NOTE — Assessment & Plan Note (Signed)
Followed by pulmonary. No changes made to rxs today. Hopefully she will get some relief with spiriva and pulmonary rehab.

## 2014-09-02 NOTE — Assessment & Plan Note (Signed)
New- referral to audiology placed.

## 2014-09-02 NOTE — Assessment & Plan Note (Signed)
The patients weight, height, BMI and visual acuity have been recorded in the chart.  Cognitive function assessed.   I have made referrals, counseling and provided education to the patient based review of the above and I have provided the pt with a written personalized care plan for preventive services.  Orders Placed This Encounter  Procedures  . CBC with Differential/Platelet  . Comprehensive metabolic panel  . Lipid panel  . TSH    

## 2014-09-02 NOTE — Progress Notes (Signed)
Pre visit review using our clinic review tool, if applicable. No additional management support is needed unless otherwise documented below in the visit note. 

## 2014-09-02 NOTE — Patient Instructions (Signed)
Great to see you. We will call you with your lab results and you can view them online.  We will call you with a referral to an audiologist.

## 2014-09-02 NOTE — Assessment & Plan Note (Signed)
DEXA now showing osteopenia. Continue twice yearly prolia. Due for repeat DEXA next year.

## 2014-09-02 NOTE — Progress Notes (Signed)
Patient ID: Dorothy Church, female    DOB: [birth year], 79 y.o.   MRN: [id number]  HPI Comments:  Very pleasant 79 yo female with a history of asthma, obstructive sleep apnea who wears CPAP, hyperlipidemia, OA, osteoporosis here for medicare wellness visit and follow up of chronic medical conditions.  I have personally reviewed the Medicare Annual Wellness questionnaire and have noted 1. The patient's medical and social history 2. Their use of alcohol, tobacco or illicit drugs 3. Their current medications and supplements 4. The patient's functional ability including ADL's, fall risks, home safety risks and hearing or visual             impairment. 5. Diet and physical activities 6. Evidence for depression or mood disorders  End of life wishes discussed and updated in Social History.  The roster of all physicians providing medical care to patient - is listed in the Care[employer]ams section of the chart.  Mammogram 05/19/13 Zostavax 05/08/2007 Pneumovax 10/07/2009 Prevnar 13 11/24/13 Influenza 11/10/12 Colonoscopy 11/2008- 5 year recall due to polyps.  Colonoscopy scheduled with Dr. Gustavo Lah in 10/2014. DEXA 05/19/13  No h/o post menopausal bleeding (early menopause in 75s).  OSA- Wears a CPAP. This has not helped with her fatigue or SOB dramatically. Pulmonologist is Dr. Raul Del.  Feels she could sleep all day- hypersomnia.  Osteoporosis-  She is receiving prolia, last injection 05/11/14.  Most recent DEXA from 05/20/14 did show improvement- now osteopenic.  Emphysema- followed by pulmonary.  Was last [facility] by [facility] on 08[facility]16/14- note reviewed. Added spiriva Respimat, continued symbicort and albuterol inhaler.  Also referred to pulmonary rehab.  Has not yet started pulmonary rehab.  With all the humidity in the air right now, she has not yet noticed much improvement with addition of spiriva.  Lab Results  Component Value Date   CHOL 149 04/23/2013   HDL 55.50 04/23/2013   LDLCALC 78 04/23/2013    TRIG 77.0 04/23/2013   CHOLHDL 3 04/23/2013   Lab Results  Component Value Date   CREATININE 0.75 03/01/2014   Lab Results  Component Value Date   TSH 0.94 04/23/2013   Lab Results  Component Value Date   WBC 4.4 03/01/2014   HGB 14.0 03/01/2014   HCT 42.6 03/01/2014   MCV 91 03/01/2014   PLT 190 03/01/2014   Lab Results  Component Value Date   ALT 14 04/23/2013   AST 17 04/23/2013   ALKPHOS 32* 04/23/2013   BILITOT 0.7 04/23/2013     Patient Active Problem List   Diagnosis Date Noted  . Sting from hornet, wasp, or bee 08/12/2014  . COPD (chronic obstructive pulmonary disease) 04/27/2014  . History of colonic polyps 12/08/2013  . Nevus 12/08/2013  . Right cervical radiculopathy 08/05/2013  . Medicare annual wellness visit, subsequent 04/23/2013  . Hypersomnia with sleep apnea, unspecified 04/23/2013  . ETD (eustachian tube dysfunction) 01/30/2013  . Left knee pain 10/07/2012  . Osteoporosis 10/07/2012  . Vitamin D deficiency   . Hypothyroidism   . SOB (shortness of breath) 08/07/2012  . Fatigue 08/07/2012  . Dizziness 08/07/2012  . Hyperlipidemia 08/07/2012  . Obstructive sleep apnea on CPAP 08/07/2012  . Asthma 08/07/2012   Past Medical History  Diagnosis Date  . Intrinsic asthma, unspecified   . Aortic valve disorders   . Obstructive sleep apnea (adult) (pediatric)   . Vitamin D deficiency   . Barrett's esophagus   . Impaired fasting glucose   . COPD (chronic obstructive pulmonary disease)   .  Syncope and collapse   . Hypothyroidism   . Osteoporosis    Past Surgical History  Procedure Laterality Date  . Replacement unicondylar joint knee    . Hip surgery    . Colonoscopy    . Eye surgery    . Prosthesis eye implant    . Tonsillectomy     History  Substance Use Topics  . Smoking status: Never Smoker   . Smokeless tobacco: Never Used  . Alcohol Use: Yes     Comment: occas. wine   Family History  Problem Relation Age of Onset  . Heart  disease Father   . Bipolar disorder Brother    Allergies  Allergen Reactions  . Amoxicillin Other (See Comments)    Pencillins  . Cefzil [Cefprozil]   . Cephalexin   . Propoxyphene     Other reaction(s): Unknown  . Relafen [Nabumetone]   . Talwin [Pentazocine]    Current Outpatient Prescriptions on File Prior to Visit  Medication Sig Dispense Refill  . albuterol (VENTOLIN HFA) 108 (90 BASE) MCG/ACT inhaler Inhale 2 puffs into the lungs every 6 (six) hours as needed for wheezing.    Marland Kitchen aspirin 81 MG tablet Take 81 mg by mouth daily.    . budesonide-formoterol (SYMBICORT) 160-4.5 MCG/ACT inhaler Inhale 2 puffs into the lungs daily as needed. (Patient taking differently: Inhale 2 puffs into the lungs daily. ) 1 Inhaler 1  . calcium carbonate (OS-CAL) 600 MG TABS Take 600 mg by mouth daily.    Marland Kitchen denosumab (PROLIA) 60 MG/ML SOLN injection Inject 60 mg into the skin every 6 (six) months. Administer in upper arm, thigh, or abdomen    . esomeprazole (NEXIUM) 40 MG capsule Take 40 mg by mouth daily before breakfast.    . ipratropium-albuterol (DUONEB) 0.5-2.5 (3) MG/3ML SOLN Take 3 mLs by nebulization every 8 (eight) hours as needed. 1 ampule 3 times a day for 3 days. 360 mL 3  . levothyroxine (SYNTHROID, LEVOTHROID) 50 MCG tablet Take 1 tablet (50 mcg  total) by mouth daily  before breakfast. 30 tablet 0  . Multiple Vitamin (MULTIVITAMIN) tablet Take 1 tablet by mouth daily.    Marland Kitchen PARoxetine (PAXIL) 30 MG tablet Take 1 tablet by mouth  every morning 90 tablet 1  . simvastatin (ZOCOR) 40 MG tablet Take 1 tablet (40 mg total) by mouth daily at 6 PM. NEED TO MAKE APPOINTMENT FOR ANNUAL PHYSICAL 90 tablet 0  . Tiotropium Bromide Monohydrate (SPIRIVA RESPIMAT) 2.5 MCG/ACT AERS Inhale 2 puffs into the lungs daily. Gargle and rinse after each use. 3 Inhaler 2   No current facility-administered medications on file prior to visit.   The PMH, PSH, Social History, Family History, Medications, and allergies  have been reviewed in [facility], and have been updated if relevant.     Review of Systems  Constitutional: Negative.   HENT:       Decreased hearing  Eyes: Negative.   Respiratory: Positive for shortness of breath and wheezing. Negative for stridor.   Cardiovascular: Negative.   Gastrointestinal: Negative.   Endocrine: Negative.   Genitourinary: Negative.   Musculoskeletal: Negative.   Skin: Negative.   Allergic/Immunologic: Negative.   Neurological: Negative.   Hematological: Negative.   Psychiatric/Behavioral: Negative.   All other systems reviewed and are negative.       Physical Exam  BP 128/72 mmHg  Pulse 78  Temp(Src) 98 F (36.7 C) (Oral)  Ht 5' 6.25" (1.683 m)  Wt 144 lb 8 oz (  65.545 kg)  BMI 23.14 kg/m2  SpO2 94%  Constitutional: She is oriented to person, place, and time. She appears well-developed and well-nourished.  HENT:  Head: Normocephalic.  Nose: Nose normal.  Mouth/Throat: Oropharynx is clear and moist.  Eyes: Conjunctivae are normal. Pupils are equal, round, and reactive to light.  Neck: Normal range of motion. Neck supple. No JVD present.  Cardiovascular: Normal rate, regular rhythm, S1 normal, S2 normal, normal heart sounds and intact distal pulses.  Exam reveals no gallop and no friction rub.   No murmur heard. Pulmonary/Chest: Effort normal. No respiratory distress. She has no wheezes. She has no rales. She exhibits no tenderness.  faint crackles at the bases bilaterally Abdominal: Soft. Bowel sounds are normal. She exhibits no distension. There is no tenderness.  Musculoskeletal: Normal range of motion. She exhibits no edema and no tenderness.  Lymphadenopathy:    She has no cervical adenopathy.  Neurological: She is alert and oriented to person, place, and time. Coordination normal.  Skin: Skin is warm and dry. No rash noted. No erythema.  Psychiatric: She has a normal mood and affect. Her behavior is normal. Judgment and thought content  normal.

## 2014-09-03 ENCOUNTER — Encounter: Payer: Self-pay | Admitting: Family Medicine

## 2014-09-15 ENCOUNTER — Telehealth: Payer: Self-pay | Admitting: Internal Medicine

## 2014-09-16 NOTE — Telephone Encounter (Signed)
Spoke with pt and informed her RX had been sent by her PCP. Also told her that it is requiring a PA and that she will need to contact her PCP for the this on the refill they sent. Pt verbalized understanding and will call PCP. Nothing further needed.

## 2014-09-17 ENCOUNTER — Encounter: Payer: Self-pay | Admitting: Family Medicine

## 2014-09-17 MED ORDER — LEVOTHYROXINE SODIUM 50 MCG PO TABS: ORAL_TABLET | ORAL | Status: DC

## 2014-10-05 ENCOUNTER — Encounter: Payer: Medicare Other | Attending: Internal Medicine | Admitting: Respiratory Therapy

## 2014-10-05 VITALS — Ht 67.0 in | Wt 144.6 lb

## 2014-10-05 DIAGNOSIS — J449 Chronic obstructive pulmonary disease, unspecified: Secondary | ICD-10-CM | POA: Insufficient documentation

## 2014-10-05 NOTE — Progress Notes (Signed)
Pulmonary Individual Treatment Plan  Patient Details  Name: Dorothy Church MRN: [id number] Date of Birth: [birth year] Referring Provider:  Vilinda Boehringer, MD  Initial Encounter Date: 10/05/2014  Visit Diagnosis: COPD, moderate  Patient's Home Medications on Admission:  Current outpatient prescriptions:    albuterol (VENTOLIN HFA) 108 (90 BASE) MCG/ACT inhaler, Inhale 2 puffs into the lungs every 6 (six) hours as needed for wheezing., Disp: 18 g, Rfl: 5   aspirin 81 MG tablet, Take 81 mg by mouth daily., Disp: , Rfl:    budesonide-formoterol (SYMBICORT) 160-4.5 MCG/ACT inhaler, Inhale 2 puffs into the lungs daily as needed. (Patient taking differently: Inhale 2 puffs into the lungs daily. ), Disp: 1 Inhaler, Rfl: 1   calcium carbonate (OS-CAL) 600 MG TABS, Take 600 mg by mouth daily., Disp: , Rfl:    denosumab (PROLIA) 60 MG/ML SOLN injection, Inject 60 mg into the skin every 6 (six) months. Administer in upper arm, thigh, or abdomen, Disp: , Rfl:    esomeprazole (NEXIUM) 40 MG capsule, Take 40 mg by mouth daily before breakfast., Disp: , Rfl:    ipratropium-albuterol (DUONEB) 0.5-2.5 (3) MG/3ML SOLN, Take 3 mLs by nebulization every 8 (eight) hours as needed. 1 ampule 3 times a day for 3 days., Disp: 360 mL, Rfl: 3   levothyroxine (SYNTHROID, LEVOTHROID) 50 MCG tablet, Take 1 tablet (50 mcg  total) by mouth daily  before breakfast., Disp: 30 tablet, Rfl: 3   Multiple Vitamin (MULTIVITAMIN) tablet, Take 1 tablet by mouth daily., Disp: , Rfl:    PARoxetine (PAXIL) 30 MG tablet, Take 1 tablet by mouth  every morning, Disp: 90 tablet, Rfl: 1   simvastatin (ZOCOR) 40 MG tablet, Take 1 tablet (40 mg total) by mouth daily at 6 PM. NEED TO MAKE APPOINTMENT FOR ANNUAL PHYSICAL, Disp: 90 tablet, Rfl: 0   Tiotropium Bromide Monohydrate (SPIRIVA RESPIMAT) 2.5 MCG/ACT AERS, Inhale 2 puffs into the lungs daily. Gargle and rinse after each use., Disp: 3 Inhaler, Rfl: 2  Past Medical  History: Past Medical History  Diagnosis Date   Intrinsic asthma, unspecified    Aortic valve disorders    Obstructive sleep apnea (adult) (pediatric)    Vitamin D deficiency    Barrett's esophagus    Impaired fasting glucose    COPD (chronic obstructive pulmonary disease)    Syncope and collapse    Hypothyroidism    Osteoporosis     Tobacco Use: History  Smoking status   Never Smoker   Smokeless tobacco   Never Used    Labs: Recent Review Flowsheet Data    Labs for ITP Cardiac and Pulmonary Rehab Latest Ref Rng 07/21/2012 04/23/2013 09/02/2014   Cholestrol 0 - 200 mg/dL - 149 175   LDLCALC 0 - 99 mg/dL 72 78 93   HDL >39.00 mg/dL 65 55.50 63.80   Trlycerides 0.0 - 149.0 mg/dL 54 77.0 89.0       ADL UCSD:     ADL UCSD      10/05/14 1120       ADL UCSD   SOB Score total 42     Rest 0     Walk 2     Stairs 2     Bath 3     Dress 3     Shop 3         Pulmonary Function Assessment:     Pulmonary Function Assessment - 10/05/14 1130    Pulmonary Function Tests   RV% 143 %   DLCO%  66 %   Initial Spirometry Results   FVC% 57 %   FEV1% 39 %   FEV1/FVC Ratio 51   Post Bronchodilator Spirometry Results   FVC% 75 %   FEV1% 62 %   FEV1/FVC Ratio 62   Breath   Bilateral Breath Sounds Clear;Decreased      Exercise Target Goals:    Exercise Program Goal: Individual exercise prescription set with THRR, safety & activity barriers. Participant demonstrates ability to understand and report RPE using BORG scale, to self-measure pulse accurately, and to acknowledge the importance of the exercise prescription.  Exercise Prescription Goal: Starting with aerobic activity 30 plus minutes a day, 3 days per week for initial exercise prescription. Provide home exercise prescription and guidelines that participant acknowledges understanding prior to discharge.  Activity Barriers & Risk Stratification:     Activity Barriers & Risk Stratification - 10/05/14  1120    Activity Barriers & Risk Stratification   Activity Barriers Shortness of Breath   Risk Stratification Low      6 Minute Walk:     6 Minute Walk      10/05/14 1120       6 Minute Walk   Phase Initial     Distance 1140 feet     Walk Time 6 minutes     Resting HR 80 bpm     Resting BP 110/60 mmHg     Max Ex. HR 114 bpm     Max Ex. BP 124/68 mmHg     RPE 15     Perceived Dyspnea  3        Initial Exercise Prescription:   Exercise Prescription Changes:   Discharge Exercise Prescription (Final Exercise Prescription Changes):    Nutrition:  Target Goals: Understanding of nutrition guidelines, daily intake of sodium 1500mg , cholesterol 200mg , calories 30% from fat and 7% or less from saturated fats, daily to have 5 or more servings of fruits and vegetables.  Biometrics:    Nutrition Therapy Plan and Nutrition Goals:     Nutrition Therapy & Goals - 10/05/14 1120    Nutrition Therapy   Diet Prefers not to meet with the dietitian; She eats fruits and vegetables mainly      Nutrition Discharge: Rate Your Plate Scores:   Psychosocial: Target Goals: Acknowledge presence or absence of depression, maximize coping skills, provide positive support system. Participant is able to verbalize types and ability to use techniques and skills needed for reducing stress and depression.  Initial Review & Psychosocial Screening:     Initial Psych Review & Screening - 10/05/14 1120    Family Dynamics   Good Support System? Yes  Ms Wake has good support from her son and friends.   Barriers   Psychosocial barriers to participate in program There are no identifiable barriers or psychosocial needs.;The patient should benefit from training in stress management and relaxation.   Screening Interventions   Interventions Encouraged to exercise      Quality of Life Scores:   PHQ-9:     Recent Review Flowsheet Data    Depression screen [facility] 2/9 10/05/2014 09/02/2014  04/23/2013   Decreased Interest 0 0 0   Down, Depressed, Hopeless 0 0 0   PHQ - 2 Score 0 0 0      Psychosocial Evaluation and Intervention:   Psychosocial Re-Evaluation:  Education: Education Goals: Education classes will be provided on a weekly basis, covering required topics. Participant will state understanding/return demonstration of topics presented.  Learning Barriers/Preferences:  Learning Barriers/Preferences - 10/05/14 1130    Learning Barriers/Preferences   Learning Barriers None   Learning Preferences Group Instruction;Individual Instruction;Pictoral;Skilled Demonstration;Verbal Instruction;Video;Written Material      Education Topics: Initial Evaluation Education: - Verbal, written and demonstration of respiratory meds, RPE/PD scales, oximetry and breathing techniques. Instruction on use of nebulizers and MDIs: cleaning and proper use, rinsing mouth with steroid doses and importance of monitoring MDI activations.          Pulmonary Rehab from 10/05/2014 in [place]   Date  10/05/14   Educator  LB   Instruction Review Code  2- meets goals/outcomes      General Nutrition Guidelines/Fats and Fiber: -Group instruction provided by verbal, written material, models and posters to present the general guidelines for heart healthy nutrition. Gives an explanation and review of dietary fats and fiber.   Controlling Sodium/Reading Food Labels: -Group verbal and written material supporting the discussion of sodium use in heart healthy nutrition. Review and explanation with models, verbal and written materials for utilization of the food label.   Exercise Physiology & Risk Factors: - Group verbal and written instruction with models to review the exercise physiology of the cardiovascular system and associated critical values. Details cardiovascular disease risk factors and the goals associated with each risk factor.   Aerobic  Exercise & Resistance Training: - Gives group verbal and written discussion on the health impact of inactivity. On the components of aerobic and resistive training programs and the benefits of this training and how to safely progress through these programs.   Flexibility, Balance, General Exercise Guidelines: - Provides group verbal and written instruction on the benefits of flexibility and balance training programs. Provides general exercise guidelines with specific guidelines to those with heart or lung disease. Demonstration and skill practice provided.   Stress Management: - Provides group verbal and written instruction about the health risks of elevated stress, cause of high stress, and healthy ways to reduce stress.   Depression: - Provides group verbal and written instruction on the correlation between heart/lung disease and depressed mood, treatment options, and the stigmas associated with seeking treatment.   Exercise & Equipment Safety: - Individual verbal instruction and demonstration of equipment use and safety with use of the equipment.   Infection Prevention: - Provides verbal and written material to individual with discussion of infection control including proper hand washing and proper equipment cleaning during exercise session.   Falls Prevention: - Provides verbal and written material to individual with discussion of falls prevention and safety.      Pulmonary Rehab from 10/05/2014 in [place]   Date  10/05/14   Educator  LB   Instruction Review Code  2- meets goals/outcomes      Diabetes: - Individual verbal and written instruction to review signs/symptoms of diabetes, desired ranges of glucose level fasting, after meals and with exercise. Advice that pre and post exercise glucose checks will be done for 3 sessions at entry of program.   Chronic Lung Diseases: - Group verbal and written instruction to review new updates,  new respiratory medications, new advancements in procedures and treatments. Provide informative websites and "800" numbers of self-education.   Lung Procedures: - Group verbal and written instruction to describe testing methods done to diagnose lung disease. Review the outcome of test results. Describe the treatment choices: Pulmonary Function Tests, ABGs and oximetry.   Energy Conservation: - Provide group verbal and written instruction for methods to  conserve energy, plan and organize activities. Instruct on pacing techniques, use of adaptive equipment and posture/positioning to relieve shortness of breath.   Triggers: - Group verbal and written instruction to review types of environmental controls: home humidity, furnaces, filters, dust mite/pet prevention, HEPA vacuums. To discuss weather changes, air quality and the benefits of nasal washing.   Exacerbations: - Group verbal and written instruction to provide: warning signs, infection symptoms, calling MD promptly, preventive modes, and value of vaccinations. Review: effective airway clearance, coughing and/or vibration techniques. Create an Sports administrator.   Oxygen: - Individual and group verbal and written instruction on oxygen therapy. Includes supplement oxygen, available portable oxygen systems, continuous and intermittent flow rates, oxygen safety, concentrators, and Medicare reimbursement for oxygen.   Respiratory Medications: - Group verbal and written instruction to review medications for lung disease. Drug class, frequency, complications, importance of spacers, rinsing mouth after steroid MDI's, and proper cleaning methods for nebulizers.      Pulmonary Rehab from 10/05/2014 in [place]   Date  10/05/14   Educator  LB   Instruction Review Code  2- meets goals/outcomes      AED/CPR: - Group verbal and written instruction with the use of models to demonstrate the basic use of the AED  with the basic ABC's of resuscitation.   Breathing Retraining: - Provides individuals verbal and written instruction on purpose, frequency, and proper technique of diaphragmatic breathing and pursed-lipped breathing. Applies individual practice skills.      Pulmonary Rehab from 10/05/2014 in [place]   Date  10/05/14   Educator  LB   Instruction Review Code  2- meets goals/outcomes      Anatomy and Physiology of the Lungs: - Group verbal and written instruction with the use of models to provide basic lung anatomy and physiology related to function, structure and complications of lung disease.   Heart Failure: - Group verbal and written instruction on the basics of heart failure: signs/symptoms, treatments, explanation of ejection fraction, enlarged heart and cardiomyopathy.   Sleep Apnea: - Individual verbal and written instruction to review Obstructive Sleep Apnea. Review of risk factors, methods for diagnosing and types of masks and machines for OSA.   Anxiety: - Provides group, verbal and written instruction on the correlation between heart/lung disease and anxiety, treatment options, and management of anxiety.   Relaxation: - Provides group, verbal and written instruction about the benefits of relaxation for patients with heart/lung disease. Also provides patients with examples of relaxation techniques.   Knowledge Questionnaire Score:     Knowledge Questionnaire Score - 10/05/14 1120    Knowledge Questionnaire Score   Pre Score -2      Personal Goals and Risk Factors at Admission:     Personal Goals and Risk Factors at Admission - 10/05/14 1120    Personal Goals and Risk Factors on Admission    Weight Management No   Increase Aerobic Exercise and Physical Activity Yes   Intervention While in program, learn and follow the exercise prescription taught. Start at a low level workload and increase workload after able to maintain  previous level for 30 minutes. Increase time before increasing intensity.  Ms Paddack lives at [facility] and can use their gym .   Understand more about Heart/Pulmonary Disease. Yes   Intervention While in program utilize professionals for any questions, and attend the education sessions. Great websites to use are www.americanheart.org or www.lung.org for reliable information.  Ms  Tabares would be interested in the education on COPD and other education for daily living management.   Improve shortness of breath with ADL's Yes   Intervention While in program, learn and follow the exercise prescription taught. Start at a low level workload and increase workload ad advised by the exercise physiologist. Increase time before increasing intensity.   Develop more efficient breathing techniques such as purse lipped breathing and diaphragmatic breathing; and practicing self-pacing with activity Yes   Intervention While in program, learn and utilize the specific breathing techniques taught to you. Continue to practice and use the techniques as needed.   Increase knowledge of respiratory medications and ability to use respiratory devices properly.  Yes   Intervention While in program, learn to administer MDI, nebulizer, and spacer properly.;Learn to take respiratory medicine as ordered.;While in program, learn to Clean MDI, nebulizers, and spacers properly.  Ms Zacher uses Spiriva, Ventolin MDI and SVN, Symbicort, and a spacer.  she is also on CPAP - full face mask; [employer].   Lipids Yes      Personal Goals and Risk Factors Review:    Personal Goals Discharge:    Comments: Ms Stipe plans to start [place] on 10/11/2014 and attend 3 days/week.

## 2014-10-07 NOTE — Progress Notes (Signed)
Pulmonary Individual Treatment Plan  Patient Details  Name: Dorothy Church MRN: [id number] Date of Birth: 1935/03/25 Referring Provider:  Vilinda Boehringer, MD  Initial Encounter Date: Date: 10/05/14  Visit Diagnosis: COPD, moderate  Patient's Home Medications on Admission:  Current outpatient prescriptions:  .  albuterol (VENTOLIN HFA) 108 (90 BASE) MCG/ACT inhaler, Inhale 2 puffs into the lungs every 6 (six) hours as needed for wheezing., Disp: 18 g, Rfl: 5 .  aspirin 81 MG tablet, Take 81 mg by mouth daily., Disp: , Rfl:  .  budesonide-formoterol (SYMBICORT) 160-4.5 MCG/ACT inhaler, Inhale 2 puffs into the lungs daily as needed. (Patient taking differently: Inhale 2 puffs into the lungs daily. ), Disp: 1 Inhaler, Rfl: 1 .  calcium carbonate (OS-CAL) 600 MG TABS, Take 600 mg by mouth daily., Disp: , Rfl:  .  denosumab (PROLIA) 60 MG/ML SOLN injection, Inject 60 mg into the skin every 6 (six) months. Administer in upper arm, thigh, or abdomen, Disp: , Rfl:  .  esomeprazole (NEXIUM) 40 MG capsule, Take 40 mg by mouth daily before breakfast., Disp: , Rfl:  .  ipratropium-albuterol (DUONEB) 0.5-2.5 (3) MG/3ML SOLN, Take 3 mLs by nebulization every 8 (eight) hours as needed. 1 ampule 3 times a day for 3 days., Disp: 360 mL, Rfl: 3 .  levothyroxine (SYNTHROID, LEVOTHROID) 50 MCG tablet, Take 1 tablet (50 mcg  total) by mouth daily  before breakfast., Disp: 30 tablet, Rfl: 3 .  Multiple Vitamin (MULTIVITAMIN) tablet, Take 1 tablet by mouth daily., Disp: , Rfl:  .  PARoxetine (PAXIL) 30 MG tablet, Take 1 tablet by mouth  every morning, Disp: 90 tablet, Rfl: 1 .  simvastatin (ZOCOR) 40 MG tablet, Take 1 tablet (40 mg total) by mouth daily at 6 PM. NEED TO MAKE APPOINTMENT FOR ANNUAL PHYSICAL, Disp: 90 tablet, Rfl: 0 .  Tiotropium Bromide Monohydrate (SPIRIVA RESPIMAT) 2.5 MCG/ACT AERS, Inhale 2 puffs into the lungs daily. Gargle and rinse after each use., Disp: 3 Inhaler, Rfl: 2  Past Medical  History: Past Medical History  Diagnosis Date  . Intrinsic asthma, unspecified   . Aortic valve disorders   . Obstructive sleep apnea (adult) (pediatric)   . Vitamin D deficiency   . Barrett's esophagus   . Impaired fasting glucose   . COPD (chronic obstructive pulmonary disease)   . Syncope and collapse   . Hypothyroidism   . Osteoporosis     Tobacco Use: History  Smoking status  . Never Smoker   Smokeless tobacco  . Never Used    Labs: Recent Review Flowsheet Data    Labs for ITP Cardiac and Pulmonary Rehab Latest Ref Rng 07/21/2012 04/23/2013 09/02/2014   Cholestrol 0 - 200 mg/dL - 149 175   LDLCALC 0 - 99 mg/dL 72 78 93   HDL >39.00 mg/dL 65 55.50 63.80   Trlycerides 0.0 - 149.0 mg/dL 54 77.0 89.0       ADL UCSD:     ADL UCSD      10/05/14 1120       ADL UCSD   SOB Score total 42     Rest 0     Walk 2     Stairs 2     Bath 3     Dress 3     Shop 3         Pulmonary Function Assessment:     Pulmonary Function Assessment - 10/05/14 1130    Pulmonary Function Tests   RV% 143 %  DLCO% 66 %   Initial Spirometry Results   FVC% 57 %   FEV1% 39 %   FEV1/FVC Ratio 51   Post Bronchodilator Spirometry Results   FVC% 75 %   FEV1% 62 %   FEV1/FVC Ratio 62   Breath   Bilateral Breath Sounds Clear;Decreased      Exercise Target Goals: Date: 10/05/14  Exercise Program Goal: Individual exercise prescription set with THRR, safety & activity barriers. Participant demonstrates ability to understand and report RPE using BORG scale, to self-measure pulse accurately, and to acknowledge the importance of the exercise prescription.  Exercise Prescription Goal: Starting with aerobic activity 30 plus minutes a day, 3 days per week for initial exercise prescription. Provide home exercise prescription and guidelines that participant acknowledges understanding prior to discharge.  Activity Barriers & Risk Stratification:     Activity Barriers & Risk  Stratification - 10/05/14 1120    Activity Barriers & Risk Stratification   Activity Barriers Shortness of Breath   Risk Stratification Low      6 Minute Walk:     6 Minute Walk      10/05/14 1120 10/07/14 0854     6 Minute Walk   Phase Initial Initial    Distance 1140 feet 1140 feet    Walk Time 6 minutes 6 minutes    Resting HR 80 bpm 80 bpm    Resting BP 110/60 mmHg 110/60 mmHg    Max Ex. HR 114 bpm 114 bpm    Max Ex. BP 124/68 mmHg 124/68 mmHg    RPE 15 15    Perceived Dyspnea  3 3    Symptoms  No       Initial Exercise Prescription:     Initial Exercise Prescription - 10/07/14 0800    Date of Initial Exercise Prescription   Date 10/05/14   Treadmill   MPH 1.5   Grade 0   Minutes 10   Recumbant Bike   Level 2   RPM 40   Watts 20   Minutes 10   NuStep   Level 2   Watts 40   Minutes 10   Arm Ergometer   Level 1   Watts 10   Minutes 10   REL-XR   Level 2   Watts 40   Minutes 10   Prescription Details   Frequency (times per week) 3   Duration Progress to 30 minutes of continuous aerobic without signs/symptoms of physical distress   Intensity   THRR REST +  30   Ratings of Perceived Exertion 11-15   Perceived Dyspnea 2-4   Progression Continue progressive overload as per policy without signs/symptoms or physical distress.   Resistance Training   Training Prescription Yes   Weight 2   Reps 10-15      Exercise Prescription Changes:   Discharge Exercise Prescription (Final Exercise Prescription Changes):    Nutrition:  Target Goals: Understanding of nutrition guidelines, daily intake of sodium 1500mg , cholesterol 200mg , calories 30% from fat and 7% or less from saturated fats, daily to have 5 or more servings of fruits and vegetables.  Biometrics:     Pre Biometrics - 10/07/14 0856    Pre Biometrics   Height 5\' 7"  (1.702 m)   Weight 144 lb 9.6 oz (65.59 kg)   Waist Circumference 32 inches   Hip Circumference 38 inches   Waist to  Hip Ratio 0.84 %   BMI (Calculated) 22.7       Nutrition Therapy Plan  and Nutrition Goals:     Nutrition Therapy & Goals - 10/05/14 1120    Nutrition Therapy   Diet Prefers not to meet with the dietitian; She eats fruits and vegetables mainly      Nutrition Discharge: Rate Your Plate Scores:   Psychosocial: Target Goals: Acknowledge presence or absence of depression, maximize coping skills, provide positive support system. Participant is able to verbalize types and ability to use techniques and skills needed for reducing stress and depression.  Initial Review & Psychosocial Screening:     Initial Psych Review & Screening - 10/05/14 1120    Family Dynamics   Good Support System? Yes  Ms Stierwalt has good support from her son and friends.   Barriers   Psychosocial barriers to participate in program There are no identifiable barriers or psychosocial needs.;The patient should benefit from training in stress management and relaxation.   Screening Interventions   Interventions Encouraged to exercise      Quality of Life Scores:   PHQ-9:     Recent Review Flowsheet Data    Depression screen [facility] 2/9 10/05/2014 09/02/2014 04/23/2013   Decreased Interest 0 0 0   Down, Depressed, Hopeless 0 0 0   PHQ - 2 Score 0 0 0      Psychosocial Evaluation and Intervention:   Psychosocial Re-Evaluation:  Education: Education Goals: Education classes will be provided on a weekly basis, covering required topics. Participant will state understanding/return demonstration of topics presented.  Learning Barriers/Preferences:     Learning Barriers/Preferences - 10/05/14 1130    Learning Barriers/Preferences   Learning Barriers None   Learning Preferences Group Instruction;Individual Instruction;Pictoral;Skilled Demonstration;Verbal Instruction;Video;Written Material      Education Topics: Initial Evaluation Education: - Verbal, written and demonstration of respiratory meds, RPE/PD  scales, oximetry and breathing techniques. Instruction on use of nebulizers and MDIs: cleaning and proper use, rinsing mouth with steroid doses and importance of monitoring MDI activations.          Pulmonary Rehab from 10/05/2014 in [place]   Date  10/05/14   Educator  LB   Instruction Review Code  2- meets goals/outcomes      General Nutrition Guidelines/Fats and Fiber: -Group instruction provided by verbal, written material, models and posters to present the general guidelines for heart healthy nutrition. Gives an explanation and review of dietary fats and fiber.   Controlling Sodium/Reading Food Labels: -Group verbal and written material supporting the discussion of sodium use in heart healthy nutrition. Review and explanation with models, verbal and written materials for utilization of the food label.   Exercise Physiology & Risk Factors: - Group verbal and written instruction with models to review the exercise physiology of the cardiovascular system and associated critical values. Details cardiovascular disease risk factors and the goals associated with each risk factor.   Aerobic Exercise & Resistance Training: - Gives group verbal and written discussion on the health impact of inactivity. On the components of aerobic and resistive training programs and the benefits of this training and how to safely progress through these programs.   Flexibility, Balance, General Exercise Guidelines: - Provides group verbal and written instruction on the benefits of flexibility and balance training programs. Provides general exercise guidelines with specific guidelines to those with heart or lung disease. Demonstration and skill practice provided.   Stress Management: - Provides group verbal and written instruction about the health risks of elevated stress, cause of high stress, and healthy ways to reduce stress.  Depression: - Provides group verbal  and written instruction on the correlation between heart/lung disease and depressed mood, treatment options, and the stigmas associated with seeking treatment.   Exercise & Equipment Safety: - Individual verbal instruction and demonstration of equipment use and safety with use of the equipment.   Infection Prevention: - Provides verbal and written material to individual with discussion of infection control including proper hand washing and proper equipment cleaning during exercise session.   Falls Prevention: - Provides verbal and written material to individual with discussion of falls prevention and safety.      Pulmonary Rehab from 10/05/2014 in [place]   Date  10/05/14   Educator  LB   Instruction Review Code  2- meets goals/outcomes      Diabetes: - Individual verbal and written instruction to review signs/symptoms of diabetes, desired ranges of glucose level fasting, after meals and with exercise. Advice that pre and post exercise glucose checks will be done for 3 sessions at entry of program.   Chronic Lung Diseases: - Group verbal and written instruction to review new updates, new respiratory medications, new advancements in procedures and treatments. Provide informative websites and "800" numbers of self-education.   Lung Procedures: - Group verbal and written instruction to describe testing methods done to diagnose lung disease. Review the outcome of test results. Describe the treatment choices: Pulmonary Function Tests, ABGs and oximetry.   Energy Conservation: - Provide group verbal and written instruction for methods to conserve energy, plan and organize activities. Instruct on pacing techniques, use of adaptive equipment and posture/positioning to relieve shortness of breath.   Triggers: - Group verbal and written instruction to review types of environmental controls: home humidity, furnaces, filters, dust mite/pet prevention,  HEPA vacuums. To discuss weather changes, air quality and the benefits of nasal washing.   Exacerbations: - Group verbal and written instruction to provide: warning signs, infection symptoms, calling MD promptly, preventive modes, and value of vaccinations. Review: effective airway clearance, coughing and/or vibration techniques. Create an Sports administrator.   Oxygen: - Individual and group verbal and written instruction on oxygen therapy. Includes supplement oxygen, available portable oxygen systems, continuous and intermittent flow rates, oxygen safety, concentrators, and Medicare reimbursement for oxygen.   Respiratory Medications: - Group verbal and written instruction to review medications for lung disease. Drug class, frequency, complications, importance of spacers, rinsing mouth after steroid MDI's, and proper cleaning methods for nebulizers.      Pulmonary Rehab from 10/05/2014 in [place]   Date  10/05/14   Educator  LB   Instruction Review Code  2- meets goals/outcomes      AED/CPR: - Group verbal and written instruction with the use of models to demonstrate the basic use of the AED with the basic ABC's of resuscitation.   Breathing Retraining: - Provides individuals verbal and written instruction on purpose, frequency, and proper technique of diaphragmatic breathing and pursed-lipped breathing. Applies individual practice skills.      Pulmonary Rehab from 10/05/2014 in [place]   Date  10/05/14   Educator  LB   Instruction Review Code  2- meets goals/outcomes      Anatomy and Physiology of the Lungs: - Group verbal and written instruction with the use of models to provide basic lung anatomy and physiology related to function, structure and complications of lung disease.   Heart Failure: - Group verbal and written instruction on the basics of  heart failure: signs/symptoms, treatments, explanation  of ejection fraction, enlarged heart and cardiomyopathy.   Sleep Apnea: - Individual verbal and written instruction to review Obstructive Sleep Apnea. Review of risk factors, methods for diagnosing and types of masks and machines for OSA.   Anxiety: - Provides group, verbal and written instruction on the correlation between heart/lung disease and anxiety, treatment options, and management of anxiety.   Relaxation: - Provides group, verbal and written instruction about the benefits of relaxation for patients with heart/lung disease. Also provides patients with examples of relaxation techniques.   Knowledge Questionnaire Score:     Knowledge Questionnaire Score - 10/05/14 1120    Knowledge Questionnaire Score   Pre Score -2      Personal Goals and Risk Factors at Admission:     Personal Goals and Risk Factors at Admission - 10/05/14 1120    Personal Goals and Risk Factors on Admission    Weight Management No   Increase Aerobic Exercise and Physical Activity Yes   Intervention While in program, learn and follow the exercise prescription taught. Start at a low level workload and increase workload after able to maintain previous level for 30 minutes. Increase time before increasing intensity.  Ms Neyra lives at [facility] and can use their gym .   Understand more about Heart/Pulmonary Disease. Yes   Intervention While in program utilize professionals for any questions, and attend the education sessions. Great websites to use are www.americanheart.org or www.lung.org for reliable information.  Ms Rahm would be interested in the education on COPD and other education for daily living management.   Improve shortness of breath with ADL's Yes   Intervention While in program, learn and follow the exercise prescription taught. Start at a low level workload and increase workload ad advised by the exercise physiologist. Increase time before increasing intensity.   Develop more efficient  breathing techniques such as purse lipped breathing and diaphragmatic breathing; and practicing self-pacing with activity Yes   Intervention While in program, learn and utilize the specific breathing techniques taught to you. Continue to practice and use the techniques as needed.   Increase knowledge of respiratory medications and ability to use respiratory devices properly.  Yes   Intervention While in program, learn to administer MDI, nebulizer, and spacer properly.;Learn to take respiratory medicine as ordered.;While in program, learn to Clean MDI, nebulizers, and spacers properly.  Ms Castelluccio uses Spiriva, Ventolin MDI and SVN, Symbicort, and a spacer.  she is also on CPAP - full face mask; [employer].   Lipids Yes      Personal Goals and Risk Factors Review:    Personal Goals Discharge:    Comments:

## 2014-10-07 NOTE — Patient Instructions (Signed)
Patient Instructions  Patient Details  Name: Dorothy Church MRN: [id number] Date of Birth: May 16, 1935 Referring Provider:  Vilinda Boehringer, MD  Below are the personal goals you chose as well as exercise and nutrition goals. Our goal is to help you keep on track towards obtaining and maintaining your goals. We will be discussing your progress on these goals with you throughout the program.  Initial Exercise Prescription:     Initial Exercise Prescription - 10/07/14 0800    Date of Initial Exercise Prescription   Date 10/05/14   Treadmill   MPH 1.5   Grade 0   Minutes 10   Recumbant Bike   Level 2   RPM 40   Watts 20   Minutes 10   NuStep   Level 2   Watts 40   Minutes 10   Arm Ergometer   Level 1   Watts 10   Minutes 10   REL-XR   Level 2   Watts 40   Minutes 10   Prescription Details   Frequency (times per week) 3   Duration Progress to 30 minutes of continuous aerobic without signs/symptoms of physical distress   Intensity   THRR REST +  30   Ratings of Perceived Exertion 11-15   Perceived Dyspnea 2-4   Progression Continue progressive overload as per policy without signs/symptoms or physical distress.   Resistance Training   Training Prescription Yes   Weight 2   Reps 10-15      Exercise Goals: Frequency: Be able to perform aerobic exercise three times per week working toward 3-5 days per week.  Intensity: Work with a perceived exertion of 11 (fairly light) - 15 (hard) as tolerated. Follow your new exercise prescription and watch for changes in prescription as you progress with the program. Changes will be reviewed with you when they are made.  Duration: You should be able to do 30 minutes of continuous aerobic exercise in addition to a 5 minute warm-up and a 5 minute cool-down routine.  Nutrition Goals: Your personal nutrition goals will be established when you do your nutrition analysis with the dietician.  The following are nutrition guidelines to  follow: Cholesterol < 200mg /day Sodium < 1500mg /day Fiber: Women over 50 yrs - 21 grams per day  Personal Goals:     Personal Goals and Risk Factors at Admission - 10/05/14 1120    Personal Goals and Risk Factors on Admission    Weight Management No   Increase Aerobic Exercise and Physical Activity Yes   Intervention While in program, learn and follow the exercise prescription taught. Start at a low level workload and increase workload after able to maintain previous level for 30 minutes. Increase time before increasing intensity.  Dorothy Church lives at [facility] and can use their gym .   Understand more about Heart/Pulmonary Disease. Yes   Intervention While in program utilize professionals for any questions, and attend the education sessions. Great websites to use are www.americanheart.org or www.lung.org for reliable information.  Dorothy Church would be interested in the education on COPD and other education for daily living management.   Improve shortness of breath with ADL's Yes   Intervention While in program, learn and follow the exercise prescription taught. Start at a low level workload and increase workload ad advised by the exercise physiologist. Increase time before increasing intensity.   Develop more efficient breathing techniques such as purse lipped breathing and diaphragmatic breathing; and practicing self-pacing with activity Yes   Intervention While  in program, learn and utilize the specific breathing techniques taught to you. Continue to practice and use the techniques as needed.   Increase knowledge of respiratory medications and ability to use respiratory devices properly.  Yes   Intervention While in program, learn to administer MDI, nebulizer, and spacer properly.;Learn to take respiratory medicine as ordered.;While in program, learn to Clean MDI, nebulizers, and spacers properly.  Dorothy Church uses Spiriva, Ventolin MDI and SVN, Symbicort, and a spacer.  she is also on CPAP -  full face mask; [employer].   Lipids Yes      Tobacco Use Initial Evaluation: History  Smoking status  . Never Smoker   Smokeless tobacco  . Never Used    Copy of goals given to participant.

## 2014-10-07 NOTE — Addendum Note (Signed)
Addended by: Joretta Bachelor on: 10/07/2014 09:00 AM   Modules accepted: Orders

## 2014-10-11 ENCOUNTER — Encounter: Payer: Medicare Other | Admitting: *Deleted

## 2014-10-11 ENCOUNTER — Telehealth: Payer: Self-pay | Admitting: Internal Medicine

## 2014-10-11 DIAGNOSIS — J449 Chronic obstructive pulmonary disease, unspecified: Secondary | ICD-10-CM

## 2014-10-11 DIAGNOSIS — R0602 Shortness of breath: Secondary | ICD-10-CM

## 2014-10-11 NOTE — Telephone Encounter (Signed)
That fine we can do the ONO on RA

## 2014-10-11 NOTE — Telephone Encounter (Signed)
Spoke with Leafy Ro with [employer]. Advised of below per VM.  She verbalized understanding and is aware order placed in Epic. Per Leafy Ro, pt is aware they were contacting us for order, and they will inform pt.

## 2014-10-11 NOTE — Telephone Encounter (Signed)
Spoke with Leafy Ro at [facility], she says that a Respiratory therapist did assessment and recommends that patient have an ONO on room air.   Dr. Stevenson Clinch - ok to order ONO? Please advise.

## 2014-10-11 NOTE — Progress Notes (Signed)
Daily Session Note  Patient Details  Name: Dorothy Church MRN: [id number] Date of Birth: 18-Jan-1936 Referring Provider:  Vilinda Boehringer, MD  Encounter Date: 10/11/2014  Check In:     Session Check In - 10/11/14 1152    Check-In   Staff Present Laureen Owens Shark BS, RRT, Respiratory Therapist;Maeby Vankleeck RN, BSN;Steven Way BS, ACSM EP-C, Exercise Physiologist   ER physicians immediately available to respond to emergencies [facility] immediately available ER MD   Physician(s) Dr. Marcelene Butte and Dr. Thomasene Lot    Medication changes reported     No   Fall or balance concerns reported    No   Warm-up and Cool-down Performed on first and last piece of equipment   VAD Patient? No   Pain Assessment   Currently in Pain? No/denies         Goals Met:  Proper associated with RPD/PD & O2 Sat Exercise tolerated well  Goals Unmet:  Not Applicable  Goals Comments: Attending education on controlling sodium.                                   [name]'s first day of exercise in [place] - did very well with the equipment and exercise.  Dr. Emily Filbert is Medical Director for [place] and [facility].

## 2014-10-13 ENCOUNTER — Encounter: Payer: Medicare Other | Admitting: *Deleted

## 2014-10-13 ENCOUNTER — Encounter: Payer: Self-pay | Admitting: Internal Medicine

## 2014-10-13 DIAGNOSIS — J449 Chronic obstructive pulmonary disease, unspecified: Secondary | ICD-10-CM

## 2014-10-13 NOTE — Progress Notes (Signed)
Daily Session Note  Patient Details  Name: Dorothy Church MRN: [id number] Date of Birth: [birth year] Referring Provider:  Lucille Passy, MD  Encounter Date: 10/13/2014  Check In:     Session Check In - 10/13/14 1201    Check-In   Staff Present Carson Myrtle BS, RRT, Respiratory Therapist;Babs Dabbs Dillard Essex MS, ACSM CEP Exercise Physiologist;Steven Way BS, ACSM EP-C, Exercise Physiologist   ER physicians immediately available to respond to emergencies [facility] immediately available ER MD   Physician(s) Joni Fears and Lord   Medication changes reported     No   Fall or balance concerns reported    No   Warm-up and Cool-down Performed on first and last piece of equipment   VAD Patient? No   Pain Assessment   Currently in Pain? No/denies   Multiple Pain Sites No         Goals Met:  Proper associated with RPD/PD & O2 Sat [place] with exercise equipment Using PLB without cueing & demonstrates good technique Exercise tolerated well Strength training completed today  Goals Unmet:  Not Applicable  Goals Comments: Ms Back returned to [facility] for her second day of exercise. She complained of no soreness and is ready for increased exercise goals.   Dr. Emily Filbert is Medical Director for [place] and [facility].

## 2014-10-15 ENCOUNTER — Encounter: Payer: Medicare Other | Admitting: *Deleted

## 2014-10-15 DIAGNOSIS — J439 Emphysema, unspecified: Secondary | ICD-10-CM | POA: Diagnosis not present

## 2014-10-15 DIAGNOSIS — J449 Chronic obstructive pulmonary disease, unspecified: Secondary | ICD-10-CM

## 2014-10-15 NOTE — Progress Notes (Signed)
Daily Session Note  Patient Details  Name: Dorothy Church MRN: [id number] Date of Birth: 08-13-1935 Referring Provider:  Vilinda Boehringer, MD  Encounter Date: 10/15/2014  Check In:     Session Check In - 10/15/14 1152    Check-In   Staff Present Frederich Cha RRT, RCP Respiratory Therapist;Renee Dillard Essex MS, ACSM CEP Exercise Physiologist;Dinita Migliaccio RN, BSN   ER physicians immediately available to respond to emergencies [facility] immediately available ER MD   Physician(s) Dr. Jimmye Norman and Dr. Thomasene Lot    Medication changes reported     No   Fall or balance concerns reported    No   Warm-up and Cool-down Performed on first and last piece of equipment   VAD Patient? No   Pain Assessment   Currently in Pain? No/denies         Goals Met:  Proper associated with RPD/PD & O2 Sat Independence with exercise equipment Exercise tolerated well Personal goals reviewed Strength training completed today  Goals Unmet:  Not Applicable  Goals Comments: Increased to 15 minutes on the Biostep.   Dr. Emily Filbert is Medical Director for [place] and [facility].

## 2014-10-18 ENCOUNTER — Encounter: Payer: Medicare Other | Admitting: *Deleted

## 2014-10-18 DIAGNOSIS — J449 Chronic obstructive pulmonary disease, unspecified: Secondary | ICD-10-CM | POA: Diagnosis not present

## 2014-10-18 NOTE — Progress Notes (Signed)
Daily Session Note  Patient Details  Name: Djuna Frechette MRN: [id number] Date of Birth: 02/14/1936 Referring Provider:  Vilinda Boehringer, MD  Encounter Date: 10/18/2014  Check In:     Session Check In - 10/18/14 1228    Check-In   Staff Present Carson Myrtle BS, RRT, Respiratory Therapist;Bastian Andreoli RN, BSN;Steven Way BS, ACSM EP-C, Exercise Physiologist   ER physicians immediately available to respond to emergencies See telemetry face sheet for immediately available ER MD   Physician(s) Dr. Joni Fears, Dr. Junita Push   Medication changes reported     No   Fall or balance concerns reported    No   Warm-up and Cool-down Performed on first and last piece of equipment   VAD Patient? No   Pain Assessment   Currently in Pain? No/denies         Goals Met:  Proper associated with RPD/PD & O2 Sat Exercise tolerated well  Goals Unmet:  Not Applicable  Goals Comments: Tm 1.48mh, 15 minutes on NS - new goals. [facility]    [name] Medical Director for [place].

## 2014-10-20 ENCOUNTER — Encounter: Payer: Medicare Other | Admitting: *Deleted

## 2014-10-20 DIAGNOSIS — J449 Chronic obstructive pulmonary disease, unspecified: Secondary | ICD-10-CM

## 2014-10-20 NOTE — Progress Notes (Signed)
Daily Session Note  Patient Details  Name: Dorothy Church MRN: [id number] Date of Birth: May 11, 1935 Referring Provider:  Vilinda Boehringer, MD  Encounter Date: 10/20/2014  Check In:     Session Check In - 10/20/14 1229    Check-In   Staff Present Carson Myrtle BS, RRT, Respiratory Therapist;Aayra Hornbaker Dillard Essex Dorothy, ACSM CEP Exercise Physiologist;Steven Way BS, ACSM EP-C, Exercise Physiologist   ER physicians immediately available to respond to emergencies [facility] immediately available ER MD   Physician(s) Cinda Quest and Quale   Medication changes reported     No   Fall or balance concerns reported    No   Warm-up and Cool-down Performed on first and last piece of equipment   VAD Patient? No   Pain Assessment   Currently in Pain? No/denies   Multiple Pain Sites No           Exercise Prescription Changes - 10/20/14 1200    Exercise Review   Progression Yes   Response to Exercise   Duration Progress to 50 minutes of aerobic without signs/symptoms of physical distress   Intensity THRR unchanged   Progression Continue progressive overload as per policy without signs/symptoms or physical distress.   Resistance Training   Training Prescription Yes   Weight 2   Reps 10-12   Treadmill   MPH 1.8   Grade 0   Minutes 12   NuStep   Level 2   Watts 40   Minutes 15   Recumbant Elliptical   Level 2  BioStep   RPM 30   Watts 15      Goals Met:  Proper associated with RPD/PD & O2 Sat Independence with exercise equipment Using PLB without cueing & demonstrates good technique Exercise tolerated well Personal goals reviewed Strength training completed today  Goals Unmet:  Not Applicable  Goals Comments: Reviewed goals with [name] and she is very satisfied with the program so far. All of the exercise equipment is still challenging for her, but she can tell it is making a difference.   Reviewed individualized exercise prescription and made increases per departmental policy.  Exercise increases were discussed with the patient and they were able to perform the new work loads without issue (no signs or symptoms).   Dorothy Church plans to participate in the [facility]    Dr. Emily Filbert is Medical Director for [place] and [facility].

## 2014-10-22 ENCOUNTER — Encounter: Payer: Medicare Other | Attending: Internal Medicine | Admitting: *Deleted

## 2014-10-22 DIAGNOSIS — J449 Chronic obstructive pulmonary disease, unspecified: Secondary | ICD-10-CM | POA: Diagnosis not present

## 2014-10-22 NOTE — Progress Notes (Signed)
Daily Session Note  Patient Details  Name: Dorothy Church MRN: [id number] Date of Birth: May 25, 1935 Referring Provider:  Lucille Passy, MD  Encounter Date: 10/22/2014  Check In:     Session Check In - 10/22/14 1209    Check-In   Staff Present Frederich Cha RRT, RCP Respiratory Therapist;Carroll Enterkin RN, Drusilla Kanner MS, ACSM CEP Exercise Physiologist   ER physicians immediately available to respond to emergencies [facility] immediately available ER MD   Physician(s) Jacqualine Code and Edd Fabian   Medication changes reported     No   Fall or balance concerns reported    No   Warm-up and Cool-down Performed on first and last piece of equipment   VAD Patient? No   Pain Assessment   Currently in Pain? No/denies   Multiple Pain Sites No           Exercise Prescription Changes - 10/22/14 1200    Exercise Review   Progression Yes   Response to Exercise   Duration Progress to 50 minutes of aerobic without signs/symptoms of physical distress   Intensity THRR unchanged   Progression Continue progressive overload as per policy without signs/symptoms or physical distress.   Resistance Training   Training Prescription Yes   Weight 2   Reps 10-12   Treadmill   MPH 1.8   Grade 0   Minutes 12   NuStep   Level 3   Watts 45   Minutes 15   Recumbant Elliptical   Level 2  BioStep   RPM 30   Watts 15      Goals Met:  Proper associated with RPD/PD & O2 Sat Independence with exercise equipment Using PLB without cueing & demonstrates good technique Exercise tolerated well Personal goals reviewed Strength training completed today  Goals Unmet:  Not Applicable  Goals Comments: Reviewed individualized exercise prescription and made increases per departmental policy. Exercise increases were discussed with the patient and they were able to perform the new work loads without issue (no signs or symptoms).     Dr. Emily Filbert is Medical Director for [place]  and [facility].

## 2014-10-27 ENCOUNTER — Encounter: Payer: Medicare Other | Admitting: *Deleted

## 2014-10-27 DIAGNOSIS — J449 Chronic obstructive pulmonary disease, unspecified: Secondary | ICD-10-CM | POA: Diagnosis not present

## 2014-10-27 NOTE — Progress Notes (Signed)
Daily Session Note  Patient Details  Name: Dorothy Church MRN: [id number] Date of Birth: 1935/04/27 Referring Provider:  Vilinda Boehringer, MD  Encounter Date: 10/27/2014  Check In:     Session Check In - 10/27/14 1152    Check-In   Staff Present Laureen Owens Shark BS, RRT, Respiratory Therapist;Corrissa Martello Dillard Essex MS, ACSM CEP Exercise Physiologist;Steven Way BS, ACSM EP-C, Exercise Physiologist   ER physicians immediately available to respond to emergencies [facility] immediately available ER MD   Physician(s) Cinda Quest and Marcelene Butte   Medication changes reported     No   Fall or balance concerns reported    No   Warm-up and Cool-down Performed on first and last piece of equipment   VAD Patient? No   Pain Assessment   Currently in Pain? No/denies   Multiple Pain Sites No         Goals Met:  Proper associated with RPD/PD & O2 Sat Independence with exercise equipment Using PLB without cueing & demonstrates good technique Exercise tolerated well Strength training completed today  Goals Unmet:  Not Applicable  Goals Comments: [name] did well on all exercise goals and started the leg press study today.   Dr. Emily Filbert is Medical Director for [place] and [facility].

## 2014-10-29 ENCOUNTER — Encounter: Payer: Medicare Other | Admitting: *Deleted

## 2014-10-29 ENCOUNTER — Encounter: Payer: Self-pay | Admitting: Respiratory Therapy

## 2014-10-29 DIAGNOSIS — J449 Chronic obstructive pulmonary disease, unspecified: Secondary | ICD-10-CM

## 2014-10-29 DIAGNOSIS — R1013 Epigastric pain: Secondary | ICD-10-CM | POA: Diagnosis not present

## 2014-10-29 DIAGNOSIS — Z86010 Personal history of colonic polyps: Secondary | ICD-10-CM | POA: Diagnosis not present

## 2014-10-29 DIAGNOSIS — K2270 Barrett's esophagus without dysplasia: Secondary | ICD-10-CM | POA: Diagnosis not present

## 2014-10-29 NOTE — Progress Notes (Signed)
Daily Session Note  Patient Details  Name: Dorothy Church MRN: [id number] Date of Birth: 12/19/1935 Referring Provider:  Vilinda Boehringer, MD  Encounter Date: 10/29/2014  Check In:     Session Check In - 10/29/14 1233    Check-In   Staff Present Carson Myrtle BS, RRT, Respiratory Therapist;Stacey Blanch Media RRT, RCP Respiratory Therapist;Yasmyn Bellisario Dillard Essex MS, ACSM CEP Exercise Physiologist;Susanne Bice RN, BSN, CCRP   ER physicians immediately available to respond to emergencies [facility] immediately available ER MD   Physician(s) Jacqualine Code and Joni Fears   Medication changes reported     No   Fall or balance concerns reported    No   Warm-up and Cool-down Performed on first and last piece of equipment   VAD Patient? No   Pain Assessment   Currently in Pain? No/denies   Multiple Pain Sites No           Exercise Prescription Changes - 10/29/14 1200    Exercise Review   Progression Yes   Response to Exercise   Duration Progress to 50 minutes of aerobic without signs/symptoms of physical distress   Intensity THRR unchanged   Progression Continue progressive overload as per policy without signs/symptoms or physical distress.   Resistance Training   Training Prescription Yes   Weight 2   Reps 10-12   Treadmill   MPH 1.8   Grade 0   Minutes 15   NuStep   Level 3   Watts 30   Minutes 15   Recumbant Elliptical   Level 2  BioStep   RPM 30   Watts 15      Goals Met:  Proper associated with RPD/PD & O2 Sat Independence with exercise equipment Using PLB without cueing & demonstrates good technique Exercise tolerated well Personal goals reviewed Strength training completed today  Goals Unmet:  Not Applicable  Goals Comments: Reviewed individualized exercise prescription and made increases per departmental policy. Exercise increases were discussed with the patient and they were able to perform the new work loads without issue (no signs or symptoms).   Was instructed on using  the leg press today by Dr. Mel Almond as part of the study she volunteered for.    Dr. Emily Filbert is Medical Director for [place] and [facility].

## 2014-11-01 ENCOUNTER — Encounter: Payer: Medicare Other | Admitting: Respiratory Therapy

## 2014-11-01 DIAGNOSIS — J449 Chronic obstructive pulmonary disease, unspecified: Secondary | ICD-10-CM

## 2014-11-01 NOTE — Progress Notes (Signed)
Pulmonary Individual Treatment Plan  Patient Details  Name: Dorothy Church MRN: [id number] Date of Birth: [birth year] Referring Provider:  Vilinda Boehringer, MD  Initial Encounter Date: 10/05/2014  Visit Diagnosis: COPD, moderate  Patient's Home Medications on Admission:  Current outpatient prescriptions:    albuterol (VENTOLIN HFA) 108 (90 BASE) MCG/ACT inhaler, Inhale 2 puffs into the lungs every 6 (six) hours as needed for wheezing., Disp: 18 g, Rfl: 5   aspirin 81 MG tablet, Take 81 mg by mouth daily., Disp: , Rfl:    budesonide-formoterol (SYMBICORT) 160-4.5 MCG/ACT inhaler, Inhale 2 puffs into the lungs daily as needed. (Patient taking differently: Inhale 2 puffs into the lungs daily. ), Disp: 1 Inhaler, Rfl: 1   calcium carbonate (OS-CAL) 600 MG TABS, Take 600 mg by mouth daily., Disp: , Rfl:    denosumab (PROLIA) 60 MG/ML SOLN injection, Inject 60 mg into the skin every 6 (six) months. Administer in upper arm, thigh, or abdomen, Disp: , Rfl:    esomeprazole (NEXIUM) 40 MG capsule, Take 40 mg by mouth daily before breakfast., Disp: , Rfl:    ipratropium-albuterol (DUONEB) 0.5-2.5 (3) MG/3ML SOLN, Take 3 mLs by nebulization every 8 (eight) hours as needed. 1 ampule 3 times a day for 3 days., Disp: 360 mL, Rfl: 3   levothyroxine (SYNTHROID, LEVOTHROID) 50 MCG tablet, Take 1 tablet (50 mcg  total) by mouth daily  before breakfast., Disp: 30 tablet, Rfl: 3   Multiple Vitamin (MULTIVITAMIN) tablet, Take 1 tablet by mouth daily., Disp: , Rfl:    PARoxetine (PAXIL) 30 MG tablet, Take 1 tablet by mouth  every morning, Disp: 90 tablet, Rfl: 1   simvastatin (ZOCOR) 40 MG tablet, Take 1 tablet (40 mg total) by mouth daily at 6 PM. NEED TO MAKE APPOINTMENT FOR ANNUAL PHYSICAL, Disp: 90 tablet, Rfl: 0   Tiotropium [place] Monohydrate (SPIRIVA RESPIMAT) 2.5 MCG/ACT AERS, Inhale 2 puffs into the lungs daily. Gargle and rinse after each use., Disp: 3 Inhaler, Rfl: 2  Past Medical  History: Past Medical History  Diagnosis Date   Intrinsic asthma, unspecified    Aortic valve disorders    Obstructive sleep apnea (adult) (pediatric)    Vitamin D deficiency    Barrett's esophagus    Impaired fasting glucose    COPD (chronic obstructive pulmonary disease)    Syncope and collapse    Hypothyroidism    Osteoporosis     Tobacco Use: History  Smoking status   Never Smoker   Smokeless tobacco   Never Used    Labs: Recent Review Flowsheet Data    Labs for ITP Cardiac and Pulmonary Rehab Latest Ref Rng 07/21/2012 04/23/2013 09/02/2014   Cholestrol 0 - 200 mg/dL - 149 175   LDLCALC 0 - 99 mg/dL 72 78 93   HDL >39.00 mg/dL 65 55.50 63.80   Trlycerides 0.0 - 149.0 mg/dL 54 77.0 89.0       ADL UCSD:     ADL UCSD      10/05/14 1120       ADL UCSD   SOB Score total 42     Rest 0     Walk 2     Stairs 2     Bath 3     Dress 3     Shop 3         Pulmonary Function Assessment:     Pulmonary Function Assessment - 10/05/14 1130    Pulmonary Function Tests   RV% 143 %   DLCO%  66 %   Initial Spirometry Results   FVC% 57 %   FEV1% 39 %   FEV1/FVC Ratio 51   Post Bronchodilator Spirometry Results   FVC% 75 %   FEV1% 62 %   FEV1/FVC Ratio 62   Breath   Bilateral Breath Sounds Clear;Decreased      Exercise Target Goals:    Exercise Program Goal: Individual exercise prescription set with THRR, safety & activity barriers. Participant demonstrates ability to understand and report RPE using BORG scale, to self-measure pulse accurately, and to acknowledge the importance of the exercise prescription.  Exercise Prescription Goal: Starting with aerobic activity 30 plus minutes a day, 3 days per week for initial exercise prescription. Provide home exercise prescription and guidelines that participant acknowledges understanding prior to discharge.  Activity Barriers & Risk Stratification:     Activity Barriers & Risk Stratification - 10/05/14  1120    Activity Barriers & Risk Stratification   Activity Barriers Shortness of Breath   Risk Stratification Low      6 Minute Walk:     6 Minute Walk      10/05/14 1120 10/07/14 0854     6 Minute Walk   Phase Initial Initial    Distance 1140 feet 1140 feet    Walk Time 6 minutes 6 minutes    Resting HR 80 bpm 80 bpm    Resting BP 110/60 mmHg 110/60 mmHg    Max Ex. HR 114 bpm 114 bpm    Max Ex. BP 124/68 mmHg 124/68 mmHg    RPE 15 15    Perceived Dyspnea  3 3    Symptoms  No       Initial Exercise Prescription:     Initial Exercise Prescription - 10/07/14 0800    Date of Initial Exercise Prescription   Date 10/05/14   Treadmill   MPH 1.5   Grade 0   Minutes 10   Recumbant Bike   Level 2   RPM 40   Watts 20   Minutes 10   NuStep   Level 2   Watts 40   Minutes 10   Arm Ergometer   Level 1   Watts 10   Minutes 10   REL-XR   Level 2   Watts 40   Minutes 10   Prescription Details   Frequency (times per week) 3   Duration Progress to 30 minutes of continuous aerobic without signs/symptoms of physical distress   Intensity   THRR REST +  30   Ratings of Perceived Exertion 11-15   Perceived Dyspnea 2-4   Progression Continue progressive overload as per policy without signs/symptoms or physical distress.   Resistance Training   Training Prescription Yes   Weight 2   Reps 10-15      Exercise Prescription Changes:     Exercise Prescription Changes      10/11/14 1400 10/15/14 1100 10/18/14 1200 10/20/14 1200 10/22/14 1200   Exercise Review   Progression    Yes Yes   Response to Exercise   Blood Pressure (Admit) 100/64 mmHg 100/64 mmHg 100/64 mmHg     Blood Pressure (Exercise) 122/62 mmHg 122/62 mmHg 122/62 mmHg     Blood Pressure (Exit) 116/72 mmHg 116/72 mmHg 116/72 mmHg     Heart Rate (Admit) 87 bpm 87 bpm 87 bpm     Heart Rate (Exercise) 120 bpm 120 bpm 120 bpm     Heart Rate (Exit) 83 bpm 83 bpm 83 bpm  Oxygen Saturation (Admit) 94 % 94 %  94 %     Oxygen Saturation (Exercise) 95 % 95 % 95 %     Oxygen Saturation (Exit) 97 % 97 % 97 %     Rating of Perceived Exertion (Exercise) _0 Perceived Dyspnea (Exercise) _1 Duration    Progress to 50 minutes of aerobic without signs/symptoms of physical distress Progress to 50 minutes of aerobic without signs/symptoms of physical distress   Intensity    THRR unchanged THRR unchanged   Progression    Continue progressive overload as per policy without signs/symptoms or physical distress. Continue progressive overload as per policy without signs/symptoms or physical distress.   Resistance Training   Training Prescription _2    Weight _3 Reps 10-12 10-12 10-12 10-12 10-12   Treadmill   MPH 1.5 1.5 1.8 1.8 1.8   Grade 0 0 0 0 0   Minutes _4 NuStep   Level _5 Watts 40 40 40 40 45   Minutes _6 Recumbant Elliptical   Level 2  BioStep 2  BioStep 2  BioStep 2  BioStep 2  BioStep   RPM _7 Watts _8 10/29/14 1200 11/01/14 1600         Exercise Review   Progression Yes Yes      Response to Exercise   Blood Pressure (Admit)  100/60 mmHg      Blood Pressure (Exercise)  146/68 mmHg      Blood Pressure (Exit)  100/60 mmHg      Heart Rate (Admit)  114 bpm      Heart Rate (Exercise)  108 bpm      Heart Rate (Exit)  84 bpm      Oxygen Saturation (Admit)  96 %      Oxygen Saturation (Exercise)  95 %      Oxygen Saturation (Exit)  92 %      Rating of Perceived Exertion (Exercise)  13      Perceived Dyspnea (Exercise)  2      Duration Progress to 50 minutes of aerobic without signs/symptoms of physical distress Progress to 50 minutes of aerobic without signs/symptoms of physical distress      Intensity THRR unchanged THRR unchanged      Progression Continue progressive overload as per policy without signs/symptoms or physical distress. Continue progressive overload as per policy  without signs/symptoms or physical distress.      Resistance Training   Training Prescription Yes Yes      Weight 2 2      Reps 10-12 10-12      Treadmill   MPH 1.8 2      Grade 0 0      Minutes 15 15      NuStep   Level 3 3      Watts 30 45      Minutes 15 15      Recumbant Elliptical   Level 2  BioStep 3  BioStep      RPM 30 30      Watts 15 15         Discharge Exercise Prescription (Final Exercise Prescription Changes):     Exercise Prescription Changes -  11/01/14 1600    Exercise Review   Progression Yes   Response to Exercise   Blood Pressure (Admit) 100/60 mmHg   Blood Pressure (Exercise) 146/68 mmHg   Blood Pressure (Exit) 100/60 mmHg   Heart Rate (Admit) 114 bpm   Heart Rate (Exercise) 108 bpm   Heart Rate (Exit) 84 bpm   Oxygen Saturation (Admit) 96 %   Oxygen Saturation (Exercise) 95 %   Oxygen Saturation (Exit) 92 %   Rating of Perceived Exertion (Exercise) 13   Perceived Dyspnea (Exercise) 2   Duration Progress to 50 minutes of aerobic without signs/symptoms of physical distress   Intensity THRR unchanged   Progression Continue progressive overload as per policy without signs/symptoms or physical distress.   Resistance Training   Training Prescription Yes   Weight 2   Reps 10-12   Treadmill   MPH 2   Grade 0   Minutes 15   NuStep   Level 3   Watts 45   Minutes 15   Recumbant Elliptical   Level 3  BioStep   RPM 30   Watts 15       Nutrition:  Target Goals: Understanding of nutrition guidelines, daily intake of sodium <1569m, cholesterol <2062m calories 30% from fat and 7% or less from saturated fats, daily to have 5 or more servings of fruits and vegetables.  Biometrics:     Pre Biometrics - 10/07/14 0856    Pre Biometrics   Height 5' 7" (1.702 m)   Weight 144 lb 9.6 oz (65.59 kg)   Waist Circumference 32 inches   Hip Circumference 38 inches   Waist to Hip Ratio 0.84 %   BMI (Calculated) 22.7       Nutrition Therapy Plan  and Nutrition Goals:     Nutrition Therapy & Goals - 10/05/14 1120    Nutrition Therapy   Diet Prefers not to meet with the dietitian; She eats fruits and vegetables mainly      Nutrition Discharge: Rate Your Plate Scores:   Psychosocial: Target Goals: Acknowledge presence or absence of depression, maximize coping skills, provide positive support system. Participant is able to verbalize types and ability to use techniques and skills needed for reducing stress and depression.  Initial Review & Psychosocial Screening:     Initial Psych Review & Screening - 10/05/14 1120    Family Dynamics   Good Support System? Yes  Ms DuOhalloranas good support from her son and friends.   Barriers   Psychosocial barriers to participate in program There are no identifiable barriers or psychosocial needs.;The patient should benefit from training in stress management and relaxation.   Screening Interventions   Interventions Encouraged to exercise      Quality of Life Scores:   PHQ-9:     Recent Review Flowsheet Data    [facility] 10/05/2014 09/02/2014 04/23/2013   Decreased Interest 0 0 0   Down, Depressed, Hopeless 0 0 0   PHQ - 2 Score 0 0 0      Psychosocial Evaluation and Intervention:     Psychosocial Evaluation - 10/27/14 1246    Psychosocial Evaluation & Interventions   Interventions Stress management education;Relaxation education;Encouraged to exercise with the program and follow exercise prescription   Comments Counselor met with Ms. DuReeboday for initial psychosocial evaluation.  She is a [age]ear old who has been diagnosed with Emphysema about a year ago.  She lives alone but reports a strong support system living in  a retirement community and actively involved in her [employer].  She also has sleep apnea and uses a CPAP.  She denies a history of depression or current symptoms.  She admits her mood is typically positive/optimistic but she does have a daughter with  some mental health issues and Ms. D's own health issues that cause some stress on occasion.  She has goals to improve her overall health, increase her stamina and strength and to learn to exercise more consistently.     Continued Psychosocial Services Needed Yes  Due to Ms. Laster's current stressors, she will benefit from the psychoeducational components of this program, particularly stress management and depression.       Psychosocial Re-Evaluation:  Education: Education Goals: Education classes will be provided on a weekly basis, covering required topics. Participant will state understanding/return demonstration of topics presented.  Learning Barriers/Preferences:     Learning Barriers/Preferences - 10/05/14 1130    Learning Barriers/Preferences   Learning Barriers None   Learning Preferences Group Instruction;Individual Instruction;Pictoral;Skilled Demonstration;Verbal Instruction;Video;Written Material      Education Topics: Initial Evaluation Education: - Verbal, written and demonstration of respiratory meds, RPE/PD scales, oximetry and breathing techniques. Instruction on use of nebulizers and MDIs: cleaning and proper use, rinsing mouth with steroid doses and importance of monitoring MDI activations.          Pulmonary Rehab from 10/27/2014 in [place]   Date  10/05/14   Educator  LB   Instruction Review Code  2- meets goals/outcomes      General Nutrition Guidelines/Fats and Fiber: -Group instruction provided by verbal, written material, models and posters to present the general guidelines for heart healthy nutrition. Gives an explanation and review of dietary fats and fiber.   Controlling Sodium/Reading Food Labels: -Group verbal and written material supporting the discussion of sodium use in heart healthy nutrition. Review and explanation with models, verbal and written materials for utilization of the food label.      Pulmonary  Rehab from 10/27/2014 in [place]   Date  10/11/14   Educator  C. Joneen Caraway, RD   Instruction Review Code  2- meets goals/outcomes      Exercise Physiology & Risk Factors: - Group verbal and written instruction with models to review the exercise physiology of the cardiovascular system and associated critical values. Details cardiovascular disease risk factors and the goals associated with each risk factor.      Pulmonary Rehab from 10/27/2014 in [place]   Date  10/27/14   Educator  SW   Instruction Review Code  2- meets goals/outcomes      Aerobic Exercise & Resistance Training: - Gives group verbal and written discussion on the health impact of inactivity. On the components of aerobic and resistive training programs and the benefits of this training and how to safely progress through these programs.   Flexibility, Balance, General Exercise Guidelines: - Provides group verbal and written instruction on the benefits of flexibility and balance training programs. Provides general exercise guidelines with specific guidelines to those with heart or lung disease. Demonstration and skill practice provided.   Stress Management: - Provides group verbal and written instruction about the health risks of elevated stress, cause of high stress, and healthy ways to reduce stress.   Depression: - Provides group verbal and written instruction on the correlation between heart/lung disease and depressed mood, treatment options, and the stigmas associated with seeking treatment.  Exercise & Equipment Safety: - Individual verbal instruction and demonstration of equipment use and safety with use of the equipment.      Pulmonary Rehab from 10/27/2014 in [place]   Date  10/15/14   Educator  C. Enterkin,RN   Instruction Review Code  2- meets goals/outcomes      Infection Prevention: -  Provides verbal and written material to individual with discussion of infection control including proper hand washing and proper equipment cleaning during exercise session.      Pulmonary Rehab from 10/27/2014 in [place]   Date  10/11/14   Educator  LB   Instruction Review Code  2- meets goals/outcomes      Falls Prevention: - Provides verbal and written material to individual with discussion of falls prevention and safety.      Pulmonary Rehab from 10/27/2014 in [place]   Date  10/05/14   Educator  LB   Instruction Review Code  2- meets goals/outcomes      Diabetes: - Individual verbal and written instruction to review signs/symptoms of diabetes, desired ranges of glucose level fasting, after meals and with exercise. Advice that pre and post exercise glucose checks will be done for 3 sessions at entry of program.      Pulmonary Rehab from 10/27/2014 in [place]   Date  10/22/14   Educator  CE   Instruction Review Code  2- meets goals/outcomes      Chronic Lung Diseases: - Group verbal and written instruction to review new updates, new respiratory medications, new advancements in procedures and treatments. Provide informative websites and "800" numbers of self-education.   Lung Procedures: - Group verbal and written instruction to describe testing methods done to diagnose lung disease. Review the outcome of test results. Describe the treatment choices: Pulmonary Function Tests, ABGs and oximetry.   Energy Conservation: - Provide group verbal and written instruction for methods to conserve energy, plan and organize activities. Instruct on pacing techniques, use of adaptive equipment and posture/positioning to relieve shortness of breath.   Triggers: - Group verbal and written instruction to review types of environmental controls: home humidity, furnaces, filters,  dust mite/pet prevention, HEPA vacuums. To discuss weather changes, air quality and the benefits of nasal washing.   Exacerbations: - Group verbal and written instruction to provide: warning signs, infection symptoms, calling MD promptly, preventive modes, and value of vaccinations. Review: effective airway clearance, coughing and/or vibration techniques. Create an Sports administrator.   Oxygen: - Individual and group verbal and written instruction on oxygen therapy. Includes supplement oxygen, available portable oxygen systems, continuous and intermittent flow rates, oxygen safety, concentrators, and Medicare reimbursement for oxygen.   Respiratory Medications: - Group verbal and written instruction to review medications for lung disease. Drug class, frequency, complications, importance of spacers, rinsing mouth after steroid MDI's, and proper cleaning methods for nebulizers.      Pulmonary Rehab from 10/27/2014 in [place]   Date  10/05/14   Educator  LB   Instruction Review Code  2- meets goals/outcomes      AED/CPR: - Group verbal and written instruction with the use of models to demonstrate the basic use of the AED with the basic ABC's of resuscitation.   Breathing Retraining: - Provides individuals verbal and written instruction on purpose, frequency, and proper technique of diaphragmatic breathing and pursed-lipped breathing. Applies individual practice skills.  Pulmonary Rehab from 10/27/2014 in [place]   Date  10/05/14   Educator  LB   Instruction Review Code  2- meets goals/outcomes      Anatomy and Physiology of the Lungs: - Group verbal and written instruction with the use of models to provide basic lung anatomy and physiology related to function, structure and complications of lung disease.   Heart Failure: - Group verbal and written instruction on the basics of heart failure: signs/symptoms,  treatments, explanation of ejection fraction, enlarged heart and cardiomyopathy.   Sleep Apnea: - Individual verbal and written instruction to review Obstructive Sleep Apnea. Review of risk factors, methods for diagnosing and types of masks and machines for OSA.   Anxiety: - Provides group, verbal and written instruction on the correlation between heart/lung disease and anxiety, treatment options, and management of anxiety.      Pulmonary Rehab from 10/27/2014 in [place]   Date  10/13/14   Educator  [facility]   Instruction Review Code  2- Meets goals/outcomes      Relaxation: - Provides group, verbal and written instruction about the benefits of relaxation for patients with heart/lung disease. Also provides patients with examples of relaxation techniques.   Knowledge Questionnaire Score:     Knowledge Questionnaire Score - 10/05/14 1120    Knowledge Questionnaire Score   Pre Score -2      Personal Goals and Risk Factors at Admission:     Personal Goals and Risk Factors at Admission - 10/05/14 1120    Personal Goals and Risk Factors on Admission    Weight Management No   Increase Aerobic Exercise and Physical Activity Yes   Intervention While in program, learn and follow the exercise prescription taught. Start at a low level workload and increase workload after able to maintain previous level for 30 minutes. Increase time before increasing intensity.  Ms Odwyer lives at [facility] and can use their gym .   Understand more about Heart/Pulmonary Disease. Yes   Intervention While in program utilize professionals for any questions, and attend the education sessions. Great websites to use are www.americanheart.org or www.lung.org for reliable information.  Ms Eckels would be interested in the education on COPD and other education for daily living management.   Improve shortness of breath with ADL's Yes   Intervention While in program, learn and  follow the exercise prescription taught. Start at a low level workload and increase workload ad advised by the exercise physiologist. Increase time before increasing intensity.   Develop more efficient breathing techniques such as purse lipped breathing and diaphragmatic breathing; and practicing self-pacing with activity Yes   Intervention While in program, learn and utilize the specific breathing techniques taught to you. Continue to practice and use the techniques as needed.   Increase knowledge of respiratory medications and ability to use respiratory devices properly.  Yes   Intervention While in program, learn to administer MDI, nebulizer, and spacer properly.;Learn to take respiratory medicine as ordered.;While in program, learn to Clean MDI, nebulizers, and spacers properly.  Ms Wortley uses Spiriva, Ventolin MDI and SVN, Symbicort, and a spacer.  she is also on CPAP - full face mask; [employer].   Lipids Yes      Personal Goals and Risk Factors Review:      Goals and Risk Factor Review      10/15/14 1400 10/20/14 1231 10/29/14 1345       Increase Aerobic Exercise and Physical  Activity   Goals Progress/Improvement seen  Yes       Comments Ameshia is very happy with her progress so far and has already made several increases on the equipment. Her time on the equipment has been lengthened and she can almost exercise for the enitre class already. She is very eager to continue to make improvements.        Understand more about Heart/Pulmonary Disease   Goals Progress/Improvement seen   Yes      Comments  She has only had two education classes so far (diet and relaxation), but she enjoyed these sessions and both had good reminders of topics she already knew.       Improve shortness of breath with ADL's   Goals Progress/Improvement seen   No      Comments  She has not noticed a change in her SOB, the exercise equipment is still challenging for her, but she is making increases and exercising for  longer periods. She has not noticed any change with activities she does at home. The heat may be a contributor to her still feeling short of breath with outdoor and household tasks.       Increase knowledge of respiratory medications   Goals Progress/Improvement seen    Yes     Comments   Ms. Sorci was given a spacer to use with her MDI at home.  She was instructed on how to use it and she was impressed with the whistle feature on this device.        Personal Goals Discharge:    Comments: 30 Day Review

## 2014-11-02 ENCOUNTER — Encounter: Payer: Self-pay | Admitting: Respiratory Therapy

## 2014-11-02 DIAGNOSIS — J449 Chronic obstructive pulmonary disease, unspecified: Secondary | ICD-10-CM

## 2014-11-02 NOTE — Progress Notes (Signed)
Daily Session Note  Patient Details  Name: Dorothy Church MRN: [id number] Date of Birth: April 17, 1935 Referring Provider:  Dr Vilinda Boehringer  Encounter Date: 11/01/2014  Check In:     Session Check In - 11/01/14 1130    Check-In   Staff Present Carson Myrtle BS, RRT, Respiratory Therapist;Steven Way BS, ACSM EP-C, Exercise Physiologist   ER physicians immediately available to respond to emergencies [facility] immediately available ER MD   Physician(s) Eveline Keto           Exercise Prescription Changes - 11/01/14 1600    Exercise Review   Progression Yes   Response to Exercise   Blood Pressure (Admit) 100/60 mmHg   Blood Pressure (Exercise) 146/68 mmHg   Blood Pressure (Exit) 100/60 mmHg   Heart Rate (Admit) 114 bpm   Heart Rate (Exercise) 108 bpm   Heart Rate (Exit) 84 bpm   Oxygen Saturation (Admit) 96 %   Oxygen Saturation (Exercise) 95 %   Oxygen Saturation (Exit) 92 %   Rating of Perceived Exertion (Exercise) 13   Perceived Dyspnea (Exercise) 2   Duration Progress to 50 minutes of aerobic without signs/symptoms of physical distress   Intensity THRR unchanged   Progression Continue progressive overload as per policy without signs/symptoms or physical distress.   Resistance Training   Training Prescription Yes   Weight 2   Reps 10-12   Treadmill   MPH 2   Grade 0   Minutes 15   NuStep   Level 3   Watts 45   Minutes 15   Recumbant Elliptical   Level 3  BioStep   RPM 30   Watts 15      Goals Met:  Proper associated with RPD/PD & O2 Sat Independence with exercise equipment Using PLB without cueing & demonstrates good technique Exercise tolerated well Personal goals reviewed Strength training completed today  Goals Unmet:  Not Applicable  Goals Comments: [name] did her second round of leg presses with the [employer]. She is progressing well with her exercise goals.   Dr. Emily Filbert is Medical Director for [place] and [facility].

## 2014-11-03 ENCOUNTER — Encounter: Payer: Medicare Other | Admitting: *Deleted

## 2014-11-03 DIAGNOSIS — J449 Chronic obstructive pulmonary disease, unspecified: Secondary | ICD-10-CM | POA: Diagnosis not present

## 2014-11-03 NOTE — Progress Notes (Signed)
Daily Session Note  Patient Details  Name: Dorothy Church MRN: [id number] Date of Birth: [birth year] Referring Provider:  Lucille Passy, MD  Encounter Date: 11/03/2014  Check In:     Session Check In - 11/03/14 1223    Check-In   Staff Present Carson Myrtle BS, RRT, Respiratory Therapist;Kahlie Deutscher Dillard Essex MS, ACSM CEP Exercise Physiologist;Steven Way BS, ACSM EP-C, Exercise Physiologist   ER physicians immediately available to respond to emergencies [facility] immediately available ER MD   Physician(s) Edd Fabian and Lord   Medication changes reported     No   Fall or balance concerns reported    No   Warm-up and Cool-down Performed on first and last piece of equipment   VAD Patient? No   Pain Assessment   Currently in Pain? No/denies   Multiple Pain Sites No         Goals Met:  Proper associated with RPD/PD & O2 Sat Independence with exercise equipment Using PLB without cueing & demonstrates good technique Exercise tolerated well Strength training completed today  Goals Unmet:  Not Applicable  Goals Comments: [name] said she is feeling tired today and heavy in the legs. She still wanted to complete all exercise goals and did not have to decrease on any of the equipment.    Dr. Emily Filbert is Medical Director for [place] and [facility].

## 2014-11-05 ENCOUNTER — Encounter: Payer: Medicare Other | Admitting: *Deleted

## 2014-11-05 DIAGNOSIS — J449 Chronic obstructive pulmonary disease, unspecified: Secondary | ICD-10-CM | POA: Diagnosis not present

## 2014-11-05 NOTE — Progress Notes (Signed)
Daily Session Note  Patient Details  Name: Dorothy Church MRN: [id number] Date of Birth: 02/13/1936 Referring Provider:  Vilinda Boehringer, MD  Encounter Date: 11/05/2014  Check In:     Session Check In - 11/05/14 1258    Check-In   Staff Present Frederich Cha RRT, RCP Respiratory Therapist;Carroll Enterkin RN, Drusilla Kanner MS, ACSM CEP Exercise Physiologist   ER physicians immediately available to respond to emergencies [facility] immediately available ER MD   Physician(s) Jimmye Norman and Burlene Arnt   Medication changes reported     No   Fall or balance concerns reported    No   Warm-up and Cool-down Performed on first and last piece of equipment   VAD Patient? No   Pain Assessment   Currently in Pain? No/denies   Multiple Pain Sites No         Goals Met:  Proper associated with RPD/PD & O2 Sat Independence with exercise equipment Using PLB without cueing & demonstrates good technique Exercise tolerated well Strength training completed today  Goals Unmet:  Not Applicable  Goals Comments: Dorothy Church walked a few extra minutes on the treadmill today because she was feeling good. She is continuing to enjoy the program.    Dr. Emily Filbert is Medical Director for [place] and [facility].

## 2014-11-08 ENCOUNTER — Encounter: Payer: Medicare Other | Admitting: *Deleted

## 2014-11-08 DIAGNOSIS — J449 Chronic obstructive pulmonary disease, unspecified: Secondary | ICD-10-CM

## 2014-11-08 NOTE — Progress Notes (Signed)
Daily Session Note  Patient Details  Name: Dorothy Church MRN: [id number] Date of Birth: 02/27/1935 Referring Provider:  Mungal, Vishal, MD  Encounter Date: 11/08/2014  Check In:     Session Check In - 11/08/14 1218    Check-In   Staff Present Laureen Brown BS, RRT, Respiratory Therapist;Kelly Hayes BS, ACSM CEP Exercise Physiologist;Steven Way BS, ACSM EP-C, Exercise Physiologist   ER physicians immediately available to respond to emergencies [facility] immediately available ER MD   Physician(s) Gayle and Lord   Medication changes reported     No   Fall or balance concerns reported    No   Warm-up and Cool-down Performed on first and last piece of equipment   VAD Patient? No   Pain Assessment   Currently in Pain? No/denies   Multiple Pain Sites No           Exercise Prescription Changes - 11/08/14 1200    Exercise Review   Progression Yes   Response to Exercise   Duration Progress to 50 minutes of aerobic without signs/symptoms of physical distress   Intensity THRR unchanged   Progression Continue progressive overload as per policy without signs/symptoms or physical distress.   Resistance Training   Training Prescription Yes   Weight 2   Reps 10-12   Treadmill   MPH 2   Grade 0   Minutes 15   NuStep   Level 3   Watts 45   Minutes 15   Recumbant Elliptical   Level 4  BioStep   RPM 35   Watts 15      Goals Met:  Proper associated with RPD/PD & O2 Sat Independence with exercise equipment Exercise tolerated well Personal goals reviewed Strength training completed today  Goals Unmet:  Not Applicable  Goals Comments: Reviewed individualized exercise prescription and made increases per departmental policy. Exercise increases were discussed with the patient and they were able to perform the new work loads without issue (no signs or symptoms). Leg Press Study - 55 lbs times 25reps.    Dr. Mark Miller is Medical Director for [facility] and [facility]. 

## 2014-11-10 ENCOUNTER — Encounter: Payer: Medicare Other | Admitting: *Deleted

## 2014-11-10 ENCOUNTER — Ambulatory Visit (INDEPENDENT_AMBULATORY_CARE_PROVIDER_SITE_OTHER): Payer: Medicare Other | Admitting: Internal Medicine

## 2014-11-10 ENCOUNTER — Encounter: Payer: Self-pay | Admitting: Internal Medicine

## 2014-11-10 VITALS — BP 122/72 | HR 97 | Ht 67.0 in | Wt 146.0 lb

## 2014-11-10 DIAGNOSIS — J449 Chronic obstructive pulmonary disease, unspecified: Secondary | ICD-10-CM

## 2014-11-10 DIAGNOSIS — Z23 Encounter for immunization: Secondary | ICD-10-CM | POA: Diagnosis not present

## 2014-11-10 NOTE — Progress Notes (Signed)
Daily Session Note  Patient Details  Name: Dorothy Church MRN: [id number] Date of Birth: 05/19/1935 Referring Provider:  Vilinda Boehringer, MD  Encounter Date: 11/10/2014  Check In:     Session Check In - 11/10/14 1219    Check-In   Staff Present Carson Myrtle BS, RRT, Respiratory Therapist;Renee Dillard Essex MS, ACSM CEP Exercise Physiologist;Steven Way BS, ACSM EP-C, Exercise Physiologist   ER physicians immediately available to respond to emergencies [facility] immediately available ER MD   Physician(s) Jimmye Norman and Archie Balboa   Medication changes reported     No   Fall or balance concerns reported    No   Warm-up and Cool-down Performed on first and last piece of equipment   VAD Patient? No   Pain Assessment   Currently in Pain? No/denies   Multiple Pain Sites No         Goals Met:  Proper associated with RPD/PD & O2 Sat Independence with exercise equipment Using PLB without cueing & demonstrates good technique Exercise tolerated well Strength training completed today  Goals Unmet:  Not Applicable  Goals Comments: Vielka is doing well with all her exercise goals and is meeting with Dr.Bailey on Friday to go over updates for the leg press study.    Dr. Emily Filbert is Medical Director for [place] and [facility].

## 2014-11-10 NOTE — Progress Notes (Signed)
[facility] Consultation      MRN# [id number] Avanell Banwart 07-10-1935   CC: Chief Complaint  Patient presents with  . Follow-up    PFT/SMW results/breathing not as good since not using Symbicort; doing Pulm Rehab      Brief History: HPI 04/27/14 She is a pleasant 79 year old female past medical history of asthma presenting today for as optimization. Patient states that over the past 2 months asthma has been hard to control, she is also until she may have COPD. History as stated: In January patient presented to the ER with shortness of breath and cough she was given prednisone and antibiotics and nebulizers at that time and discharged home she improved after that however in March she noted to have increased shortness of breath fatigue chest tightness and wheezing, shortness of PMD on March 7 and was given 2 nebulizer treatments with improvement along with prescription for antibiotics and steroids. Currently she denies any fever or chills. Patient states she had asthma during childhood where she had frequent hospitalizations but no intubations. About 6 years ago she was told her asthma had returned due to increasing shortness of breath and some subjective chest congestion with possible upper respiratory tract infections. Patient states her breathing is trouble with certain triggers such as mold, dust, damp areas. She was told about 45 years ago she had COPD and was also placed on Symbicort at that time along with as needed albuterol. Currently patient states that she still feels fatigue with chest congestion and some wheezing however she has no recent sick contact exposure. 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 She has a history of obstructive sleep apnea diagnosed 8 years ago and she's currently on CPAP; she doesn't recall her CPAP settings. Patient states she never smoked tobacco had any secondhand exposure.  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 Plan: nebs, repeat CT   ROV 06/17/14 Patient presents today for a follow up visit, doing well today. Back to using symbicort. Had a CT Chest since last visit. Only using rescue inhaler about 1 time a week since last visit.   Plan: - pulmonary function testing and 6 minute walk test prior to follow up - Spiriva Respimat (2.20mcg) - 2 puff once a day - Patient given a sample - if having improvement by week 2, then patient will call us and we can fill this prescription - continue with Symbicort - continue with albuterol rescue inhaler - 2puff every 3-4 hours as needed for shortness of breath\wheezing\recurrent cough - a followup visit will consider further evaluation with GI referral if patient is still symptomatic with shortness of breath and cough  ROV 08/16/14: Patient presents for follow up visit of COPD along with PFTs and 6 MWT  Did not call in refill for spiriva, but stated that the 2 week trial was helpful Using Symbicort as directed.  Patient states overall she is doing well except she has shortness of breath mainly with exertion. No cough, no wheeze, no chest tightness. Plan: - Spiriva Respimat (2.39mcg) - 2 puff once a day - Patient given a sample - continue with Symbicort - continue with albuterol rescue inhaler - 2puff every 3-4 hours as needed for shortness of breath\wheezing\recurrent cough - Referral to pulmonary rehabilitation  Events since last clinic visit: Patient presents today for a follow up visit.  Still with some dyspnea,  but states that it has been improving. Currently on symbicort and spiriva.  States that she is in the insurance lapse, and her inhalers are very expensive.  Currently attending pulm rehab, attended 3 weeks so far.   ONO in 09/2014 showed that she does not require supplemental O2 at night.    Medication:   Current Outpatient Rx   Name  Route  Sig  Dispense  Refill  . albuterol (VENTOLIN HFA) 108 (90 BASE) MCG/ACT inhaler   Inhalation   Inhale 2 puffs into the lungs every 6 (six) hours as needed for wheezing.   18 g   5   . aspirin 81 MG tablet   Oral   Take 81 mg by mouth daily.         . calcium carbonate (OS-CAL) 600 MG TABS   Oral   Take 600 mg by mouth daily.         Marland Kitchen denosumab (PROLIA) 60 MG/ML SOLN injection   Subcutaneous   Inject 60 mg into the skin every 6 (six) months. Administer in upper arm, thigh, or abdomen         . esomeprazole (NEXIUM) 40 MG capsule   Oral   Take 40 mg by mouth daily before breakfast.         . ipratropium-albuterol (DUONEB) 0.5-2.5 (3) MG/3ML SOLN   Nebulization   Take 3 mLs by nebulization every 8 (eight) hours as needed. 1 ampule 3 times a day for 3 days.   360 mL   3   . levothyroxine (SYNTHROID, LEVOTHROID) 50 MCG tablet      Take 1 tablet (50 mcg  total) by mouth daily  before breakfast.   30 tablet   3   . Multiple Vitamin (MULTIVITAMIN) tablet   Oral   Take 1 tablet by mouth daily.         Marland Kitchen PARoxetine (PAXIL) 30 MG tablet      Take 1 tablet by mouth  every morning   90 tablet   1   . simvastatin (ZOCOR) 40 MG tablet   Oral   Take 1 tablet (40 mg total) by mouth daily at 6 PM. NEED TO MAKE APPOINTMENT FOR ANNUAL PHYSICAL   90 tablet   0   . Tiotropium Bromide Monohydrate (SPIRIVA RESPIMAT) 2.5 MCG/ACT AERS   Inhalation   Inhale 2 puffs into the lungs daily. Gargle and rinse after each use.   3 Inhaler   2   . budesonide-formoterol (SYMBICORT) 160-4.5 MCG/ACT inhaler   Inhalation   Inhale 2 puffs into the lungs daily as needed. Patient not taking: Reported on 11/10/2014   1 Inhaler   1      Review of Systems  Constitutional: Negative for fever and chills.  Eyes: Negative for blurred vision.  Respiratory: Positive for shortness of breath. Negative for cough, hemoptysis and sputum production.        Mild sob with exertion    Cardiovascular: Negative for chest pain and palpitations.  Gastrointestinal: Negative for heartburn.  Genitourinary: Negative for dysuria.  Musculoskeletal: Negative for myalgias.  Skin: Negative for itching and rash.  Neurological: Negative for dizziness and headaches.  Endo/Heme/Allergies: Does not bruise/bleed easily.  Psychiatric/Behavioral: Negative for depression.      Allergies:  Amoxicillin; Cefzil; Cephalexin; Propoxyphene; Relafen; and Talwin  Physical Examination:  VS: BP 122/72 mmHg  Pulse 97  Ht 5\' 7"  (1.702 m)  Wt 146 lb (66.225 kg)  BMI 22.86 kg/m2  SpO2 96%  General Appearance: No distress  HEENT: PERRLA, no ptosis, no other lesions noticed Pulmonary:normal breath sounds., diaphragmatic excursion normal.No wheezing, No rales   Cardiovascular:  Normal S1,S2.  No m/r/g.     Abdomen:Exam: Benign, Soft, non-tender, No masses  Skin:   warm, no rashes, no ecchymosis  Extremities: normal, no cyanosis, clubbing, warm with normal capillary refill.      PFTs and 6MWT test reviewed by Dr. Stevenson Clinch     Assessment and Plan:78 yo F with PMHx of Asthma presenting for follow up visit, still with dyspnea.  Chronic obstructive airway disease with asthma Patient with prolonged asthma and is a never smoker with no significant exposure to 2nd hand smoke. PFTs with moderate obstruction, with significant reversibility.  I believe with her prolonged asthma, she is most likely starting to have more fixed airway (which can be seen in prolonged asthmatic), symptoms mimic those of COPD, but pathophysiology is different for the 2 entities.  Given her symptoms and PFTs findings, will continue to manage as an Asthma-COPD combination.  Plan: - cont with symbicort and spiriva - patient assistance paperwork for symbicort and spiriva  - cont with exercise - albuterol as needed.     Updated Medication List Outpatient Encounter Prescriptions as of 11/10/2014  Medication Sig  .  albuterol (VENTOLIN HFA) 108 (90 BASE) MCG/ACT inhaler Inhale 2 puffs into the lungs every 6 (six) hours as needed for wheezing.  Marland Kitchen aspirin 81 MG tablet Take 81 mg by mouth daily.  . calcium carbonate (OS-CAL) 600 MG TABS Take 600 mg by mouth daily.  Marland Kitchen denosumab (PROLIA) 60 MG/ML SOLN injection Inject 60 mg into the skin every 6 (six) months. Administer in upper arm, thigh, or abdomen  . esomeprazole (NEXIUM) 40 MG capsule Take 40 mg by mouth daily before breakfast.  . ipratropium-albuterol (DUONEB) 0.5-2.5 (3) MG/3ML SOLN Take 3 mLs by nebulization every 8 (eight) hours as needed. 1 ampule 3 times a day for 3 days.  Marland Kitchen levothyroxine (SYNTHROID, LEVOTHROID) 50 MCG tablet Take 1 tablet (50 mcg  total) by mouth daily  before breakfast.  . Multiple Vitamin (MULTIVITAMIN) tablet Take 1 tablet by mouth daily.  Marland Kitchen PARoxetine (PAXIL) 30 MG tablet Take 1 tablet by mouth  every morning  . simvastatin (ZOCOR) 40 MG tablet Take 1 tablet (40 mg total) by mouth daily at 6 PM. NEED TO MAKE APPOINTMENT FOR ANNUAL PHYSICAL  . Tiotropium Bromide Monohydrate (SPIRIVA RESPIMAT) 2.5 MCG/ACT AERS Inhale 2 puffs into the lungs daily. Gargle and rinse after each use.  . budesonide-formoterol (SYMBICORT) 160-4.5 MCG/ACT inhaler Inhale 2 puffs into the lungs daily as needed. (Patient not taking: Reported on 11/10/2014)   No facility-administered encounter medications on file as of 11/10/2014.    Orders for this visit: No orders of the defined types were placed in this encounter.    Thank  you for the visitation and for allowing  [facility] to assist in the care of your patient. Our recommendations are noted above.  Please contact us if we can be of further service.  Vilinda Boehringer, MD [facility] Office Number: [phone]

## 2014-11-10 NOTE — Patient Instructions (Signed)
Follow up with Dr. Stevenson Clinch in: 3 months - cont with symbicort and spiriva - we will give you patient assistance paperwork for symbicort and spiriva  - cont with exercise - albuterol as needed.

## 2014-11-12 ENCOUNTER — Encounter: Payer: Medicare Other | Admitting: *Deleted

## 2014-11-12 DIAGNOSIS — J449 Chronic obstructive pulmonary disease, unspecified: Secondary | ICD-10-CM

## 2014-11-12 NOTE — Progress Notes (Signed)
Daily Session Note  Patient Details  Name: Dorothy Church MRN: [id number] Date of Birth: [birth year] Referring Provider:  Vilinda Boehringer, MD  Encounter Date: 11/12/2014  Check In:     Session Check In - 11/12/14 1205    Check-In   Staff Present Frederich Cha RRT, RCP Respiratory Therapist;Renee Dillard Essex MS, ACSM CEP Exercise Physiologist;Carroll Enterkin RN, BSN   ER physicians immediately available to respond to emergencies [facility] immediately available ER MD   Physician(s) Kerman Passey and Thomasene Lot   Medication changes reported     No   Fall or balance concerns reported    No   Warm-up and Cool-down Performed on first and last piece of equipment   VAD Patient? No   Pain Assessment   Currently in Pain? No/denies   Multiple Pain Sites No         Goals Met:  Proper associated with RPD/PD & O2 Sat Independence with exercise equipment Using PLB without cueing & demonstrates good technique Exercise tolerated well Strength training completed today  Goals Unmet:  Not Applicable  Goals Comments: Jerica met with Dr. Mel Almond today and evaluated her intensity on the leg press. She did well with all exercise goals in class today.    Dr. Emily Filbert is Medical Director for [place] and [facility].

## 2014-11-14 NOTE — Assessment & Plan Note (Signed)
Patient with prolonged asthma and is a never smoker with no significant exposure to 2nd hand smoke. PFTs with moderate obstruction, with significant reversibility.  I believe with her prolonged asthma, she is most likely starting to have more fixed airway (which can be seen in prolonged asthmatic), symptoms mimic those of COPD, but pathophysiology is different for the 2 entities.  Given her symptoms and PFTs findings, will continue to manage as an Asthma-COPD combination.  Plan: - cont with symbicort and spiriva - patient assistance paperwork for symbicort and spiriva  - cont with exercise - albuterol as needed.

## 2014-11-15 DIAGNOSIS — J449 Chronic obstructive pulmonary disease, unspecified: Secondary | ICD-10-CM | POA: Diagnosis not present

## 2014-11-15 NOTE — Progress Notes (Signed)
Daily Session Note  Patient Details  Name: Dorothy Church MRN: [id number] Date of Birth: 31-Mar-1935 Referring Provider:  Vilinda Boehringer, MD  Encounter Date: 11/15/2014  Check In:     Session Check In - 11/15/14 1247    Check-In   Staff Present Lestine Box BS, ACSM EP-C, Exercise Physiologist;Carroll Enterkin RN, BSN;[employer], RRT, Respiratory Therapist   ER physicians immediately available to respond to emergencies [facility] immediately available ER MD   Physician(s) quale and williams   Medication changes reported     No   Fall or balance concerns reported    No   Warm-up and Cool-down Performed on first and last piece of equipment   VAD Patient? No   Pain Assessment   Currently in Pain? No/denies           Exercise Prescription Changes - 11/15/14 1200    Exercise Review   Progression Yes   Response to Exercise   Duration Progress to 50 minutes of aerobic without signs/symptoms of physical distress   Intensity THRR unchanged   Progression Continue progressive overload as per policy without signs/symptoms or physical distress.   Resistance Training   Training Prescription Yes   Weight 2   Reps 10-12   Treadmill   MPH 2   Grade 1   Minutes 15   NuStep   Level 3   Watts 45   Minutes 15   Recumbant Elliptical   Level 4  BioStep   RPM 35   Watts 15   Minutes 15      Goals Met:  Proper associated with RPD/PD & O2 Sat Exercise tolerated well No report of cardiac concerns or symptoms Strength training completed today  Goals Unmet:  Not Applicable  Goals Comments: [name] increased her Leg Press weight to 60lbs today.   Dr. Emily Filbert is Medical Director for [place] and [facility].

## 2014-11-16 ENCOUNTER — Encounter: Payer: Self-pay | Admitting: Respiratory Therapy

## 2014-11-17 ENCOUNTER — Encounter: Payer: Medicare Other | Admitting: *Deleted

## 2014-11-17 DIAGNOSIS — J449 Chronic obstructive pulmonary disease, unspecified: Secondary | ICD-10-CM

## 2014-11-17 NOTE — Progress Notes (Signed)
Daily Session Note  Patient Details  Name: Rainy Rothman MRN: [id number] Date of Birth: 10/16/1935 Referring Provider:  Vilinda Boehringer, MD  Encounter Date: 11/17/2014  Check In:     Session Check In - 11/17/14 1210    Check-In   Staff Present Carson Myrtle BS, RRT, Respiratory Therapist;Renee Dillard Essex MS, ACSM CEP Exercise Physiologist;Carroll Enterkin RN, BSN   ER physicians immediately available to respond to emergencies [facility] immediately available ER MD   Physician(s) Dr. Lucita Lora and Dr. Kerman Passey   Medication changes reported     No   Fall or balance concerns reported    No   Warm-up and Cool-down Performed on first and last piece of equipment   VAD Patient? No   Pain Assessment   Currently in Pain? No/denies         Goals Met:  Proper associated with RPD/PD & O2 Sat Independence with exercise equipment Using PLB without cueing & demonstrates good technique Exercise tolerated well Strength training completed today  Goals Unmet:  Not Applicable  Goals Comments: Ms Wirtz continues with the leg press study at 60lbs, 20reps.   Dr. Emily Filbert is Medical Director for [place] and [facility].

## 2014-11-17 NOTE — Progress Notes (Signed)
Pulmonary Individual Treatment Plan  Patient Details  Name: Dorothy Church MRN: [id number] Date of Birth: March 08, 1935 Referring Provider:  Vilinda Boehringer, MD  Initial Encounter Date:    Visit Diagnosis: COPD, moderate  Patient's Home Medications on Admission:  Current outpatient prescriptions:  .  albuterol (VENTOLIN HFA) 108 (90 BASE) MCG/ACT inhaler, Inhale 2 puffs into the lungs every 6 (six) hours as needed for wheezing., Disp: 18 g, Rfl: 5 .  aspirin 81 MG tablet, Take 81 mg by mouth daily., Disp: , Rfl:  .  budesonide-formoterol (SYMBICORT) 160-4.5 MCG/ACT inhaler, Inhale 2 puffs into the lungs daily as needed. (Patient not taking: Reported on 11/10/2014), Disp: 1 Inhaler, Rfl: 1 .  calcium carbonate (OS-CAL) 600 MG TABS, Take 600 mg by mouth daily., Disp: , Rfl:  .  denosumab (PROLIA) 60 MG/ML SOLN injection, Inject 60 mg into the skin every 6 (six) months. Administer in upper arm, thigh, or abdomen, Disp: , Rfl:  .  esomeprazole (NEXIUM) 40 MG capsule, Take 40 mg by mouth daily before breakfast., Disp: , Rfl:  .  ipratropium-albuterol (DUONEB) 0.5-2.5 (3) MG/3ML SOLN, Take 3 mLs by nebulization every 8 (eight) hours as needed. 1 ampule 3 times a day for 3 days., Disp: 360 mL, Rfl: 3 .  levothyroxine (SYNTHROID, LEVOTHROID) 50 MCG tablet, Take 1 tablet (50 mcg  total) by mouth daily  before breakfast., Disp: 30 tablet, Rfl: 3 .  Multiple Vitamin (MULTIVITAMIN) tablet, Take 1 tablet by mouth daily., Disp: , Rfl:  .  PARoxetine (PAXIL) 30 MG tablet, Take 1 tablet by mouth  every morning, Disp: 90 tablet, Rfl: 1 .  simvastatin (ZOCOR) 40 MG tablet, Take 1 tablet (40 mg total) by mouth daily at 6 PM. NEED TO MAKE APPOINTMENT FOR ANNUAL PHYSICAL, Disp: 90 tablet, Rfl: 0 .  Tiotropium Bromide Monohydrate (SPIRIVA RESPIMAT) 2.5 MCG/ACT AERS, Inhale 2 puffs into the lungs daily. Gargle and rinse after each use., Disp: 3 Inhaler, Rfl: 2  Past Medical History: Past Medical History  Diagnosis  Date  . Intrinsic asthma, unspecified   . Aortic valve disorders   . Obstructive sleep apnea (adult) (pediatric)   . Vitamin D deficiency   . Barrett's esophagus   . Impaired fasting glucose   . COPD (chronic obstructive pulmonary disease)   . Syncope and collapse   . Hypothyroidism   . Osteoporosis     Tobacco Use: History  Smoking status  . Never Smoker   Smokeless tobacco  . Never Used    Labs: Recent Review Flowsheet Data    Labs for ITP Cardiac and Pulmonary Rehab Latest Ref Rng 07/21/2012 04/23/2013 09/02/2014   Cholestrol 0 - 200 mg/dL - 149 175   LDLCALC 0 - 99 mg/dL 72 78 93   HDL >39.00 mg/dL 65 55.50 63.80   Trlycerides 0.0 - 149.0 mg/dL 54 77.0 89.0       ADL UCSD:     ADL UCSD      10/05/14 1120       ADL UCSD   SOB Score total 42     Rest 0     Walk 2     Stairs 2     Bath 3     Dress 3     Shop 3         Pulmonary Function Assessment:     Pulmonary Function Assessment - 10/05/14 1130    Pulmonary Function Tests   RV% 143 %   DLCO% 66 %  Initial Spirometry Results   FVC% 57 %   FEV1% 39 %   FEV1/FVC Ratio 51   Post Bronchodilator Spirometry Results   FVC% 75 %   FEV1% 62 %   FEV1/FVC Ratio 62   Breath   Bilateral Breath Sounds Clear;Decreased      Exercise Target Goals:    Exercise Program Goal: Individual exercise prescription set with THRR, safety & activity barriers. Participant demonstrates ability to understand and report RPE using BORG scale, to self-measure pulse accurately, and to acknowledge the importance of the exercise prescription.  Exercise Prescription Goal: Starting with aerobic activity 30 plus minutes a day, 3 days per week for initial exercise prescription. Provide home exercise prescription and guidelines that participant acknowledges understanding prior to discharge.  Activity Barriers & Risk Stratification:     Activity Barriers & Risk Stratification - 10/05/14 1120    Activity Barriers & Risk  Stratification   Activity Barriers Shortness of Breath   Risk Stratification Low      6 Minute Walk:     6 Minute Walk      10/05/14 1120 10/07/14 0854     6 Minute Walk   Phase Initial Initial    Distance 1140 feet 1140 feet    Walk Time 6 minutes 6 minutes    Resting HR 80 bpm 80 bpm    Resting BP 110/60 mmHg 110/60 mmHg    Max Ex. HR 114 bpm 114 bpm    Max Ex. BP 124/68 mmHg 124/68 mmHg    RPE 15 15    Perceived Dyspnea  3 3    Symptoms  No       Initial Exercise Prescription:     Initial Exercise Prescription - 10/07/14 0800    Date of Initial Exercise Prescription   Date 10/05/14   Treadmill   MPH 1.5   Grade 0   Minutes 10   Recumbant Bike   Level 2   RPM 40   Watts 20   Minutes 10   NuStep   Level 2   Watts 40   Minutes 10   Arm Ergometer   Level 1   Watts 10   Minutes 10   REL-XR   Level 2   Watts 40   Minutes 10   Prescription Details   Frequency (times per week) 3   Duration Progress to 30 minutes of continuous aerobic without signs/symptoms of physical distress   Intensity   THRR REST +  30   Ratings of Perceived Exertion 11-15   Perceived Dyspnea 2-4   Progression Continue progressive overload as per policy without signs/symptoms or physical distress.   Resistance Training   Training Prescription Yes   Weight 2   Reps 10-15      Exercise Prescription Changes:     Exercise Prescription Changes      10/11/14 1400 10/15/14 1100 10/18/14 1200 10/20/14 1200 10/22/14 1200   Exercise Review   Progression    Yes Yes   Response to Exercise   Blood Pressure (Admit) 100/64 mmHg 100/64 mmHg 100/64 mmHg     Blood Pressure (Exercise) 122/62 mmHg 122/62 mmHg 122/62 mmHg     Blood Pressure (Exit) 116/72 mmHg 116/72 mmHg 116/72 mmHg     Heart Rate (Admit) 87 bpm 87 bpm 87 bpm     Heart Rate (Exercise) 120 bpm 120 bpm 120 bpm     Heart Rate (Exit) 83 bpm 83 bpm 83 bpm     Oxygen Saturation (  Admit) 94 % 94 % 94 %     Oxygen Saturation  (Exercise) 95 % 95 % 95 %     Oxygen Saturation (Exit) 97 % 97 % 97 %     Rating of Perceived Exertion (Exercise) '13 13 13     ' Perceived Dyspnea (Exercise) '2 2 2     ' Duration    Progress to 50 minutes of aerobic without signs/symptoms of physical distress Progress to 50 minutes of aerobic without signs/symptoms of physical distress   Intensity    THRR unchanged THRR unchanged   Progression    Continue progressive overload as per policy without signs/symptoms or physical distress. Continue progressive overload as per policy without signs/symptoms or physical distress.   Resistance Training   Training Prescription Yes Yes Yes Yes Yes   Weight '2 2 2 2 2   ' Reps 10-12 10-12 10-12 10-12 10-12   Treadmill   MPH 1.5 1.5 1.8 1.8 1.8   Grade 0 0 0 0 0   Minutes '10 10 10 12 12   ' NuStep   Level '2 2 2 2 3   ' Watts 40 40 40 40 45   Minutes '10 10 15 15 15   ' Recumbant Elliptical   Level 2  BioStep 2  BioStep 2  BioStep 2  BioStep 2  BioStep   RPM '30 30 30 30 30   ' Watts '10 15 15 15 15     ' 10/29/14 1200 11/01/14 1600 11/08/14 1200 11/15/14 1200     Exercise Review   Progression Yes Yes Yes Yes    Response to Exercise   Blood Pressure (Admit)  100/60 mmHg      Blood Pressure (Exercise)  146/68 mmHg      Blood Pressure (Exit)  100/60 mmHg      Heart Rate (Admit)  114 bpm      Heart Rate (Exercise)  108 bpm      Heart Rate (Exit)  84 bpm      Oxygen Saturation (Admit)  96 %      Oxygen Saturation (Exercise)  95 %      Oxygen Saturation (Exit)  92 %      Rating of Perceived Exertion (Exercise)  13      Perceived Dyspnea (Exercise)  2      Duration Progress to 50 minutes of aerobic without signs/symptoms of physical distress Progress to 50 minutes of aerobic without signs/symptoms of physical distress Progress to 50 minutes of aerobic without signs/symptoms of physical distress Progress to 50 minutes of aerobic without signs/symptoms of physical distress    Intensity THRR unchanged THRR  unchanged THRR unchanged THRR unchanged    Progression Continue progressive overload as per policy without signs/symptoms or physical distress. Continue progressive overload as per policy without signs/symptoms or physical distress. Continue progressive overload as per policy without signs/symptoms or physical distress. Continue progressive overload as per policy without signs/symptoms or physical distress.    Resistance Training   Training Prescription Yes Yes Yes Yes    Weight '2 2 2 2    ' Reps 10-12 10-12 10-12 10-12    Treadmill   MPH 1.'8 2 2 2    ' Grade 0 0 0 1    Minutes '15 15 15 15    ' NuStep   Level '3 3 3 3    ' Watts 30 45 45 45    Minutes '15 15 15 15    ' Recumbant Elliptical   Level 2  BioStep 3  BioStep 4  BioStep 4  BioStep    RPM 30 30 35 35    Watts '15 15 15 15    ' Minutes    15       Discharge Exercise Prescription (Final Exercise Prescription Changes):     Exercise Prescription Changes - 11/15/14 1200    Exercise Review   Progression Yes   Response to Exercise   Duration Progress to 50 minutes of aerobic without signs/symptoms of physical distress   Intensity THRR unchanged   Progression Continue progressive overload as per policy without signs/symptoms or physical distress.   Resistance Training   Training Prescription Yes   Weight 2   Reps 10-12   Treadmill   MPH 2   Grade 1   Minutes 15   NuStep   Level 3   Watts 45   Minutes 15   Recumbant Elliptical   Level 4  BioStep   RPM 35   Watts 15   Minutes 15       Nutrition:  Target Goals: Understanding of nutrition guidelines, daily intake of sodium <1535m, cholesterol <2016m calories 30% from fat and 7% or less from saturated fats, daily to have 5 or more servings of fruits and vegetables.  Biometrics:     Pre Biometrics - 10/07/14 0856    Pre Biometrics   Height '5\' 7"'  (1.702 m)   Weight 144 lb 9.6 oz (65.59 kg)   Waist Circumference 32 inches   Hip Circumference 38 inches   Waist to Hip  Ratio 0.84 %   BMI (Calculated) 22.7       Nutrition Therapy Plan and Nutrition Goals:     Nutrition Therapy & Goals - 10/05/14 1120    Nutrition Therapy   Diet Prefers not to meet with the dietitian; She eats fruits and vegetables mainly      Nutrition Discharge: Rate Your Plate Scores:   Psychosocial: Target Goals: Acknowledge presence or absence of depression, maximize coping skills, provide positive support system. Participant is able to verbalize types and ability to use techniques and skills needed for reducing stress and depression.  Initial Review & Psychosocial Screening:     Initial Psych Review & Screening - 10/05/14 1120    Family Dynamics   Good Support System? Yes  Ms DuKalmaras good support from her son and friends.   Barriers   Psychosocial barriers to participate in program There are no identifiable barriers or psychosocial needs.;The patient should benefit from training in stress management and relaxation.   Screening Interventions   Interventions Encouraged to exercise      Quality of Life Scores:   PHQ-9:     Recent Review Flowsheet Data    [facility] 10/05/2014 09/02/2014 04/23/2013   Decreased Interest 0 0 0   Down, Depressed, Hopeless 0 0 0   PHQ - 2 Score 0 0 0      Psychosocial Evaluation and Intervention:     Psychosocial Evaluation - 10/27/14 1246    Psychosocial Evaluation & Interventions   Interventions Stress management education;Relaxation education;Encouraged to exercise with the program and follow exercise prescription   Comments Counselor met with Ms. DuTrippettoday for initial psychosocial evaluation.  She is a [age]ear old who has been diagnosed with Emphysema about a year ago.  She lives alone but reports a strong support system living in a retirement community and actively involved in her local church.  She also has sleep apnea and uses a CPAP.  She denies a history of depression or current symptoms.  She admits her  mood is typically positive/optimistic but she does have a daughter with some mental health issues and Ms. D's own health issues that cause some stress on occasion.  She has goals to improve her overall health, increase her stamina and strength and to learn to exercise more consistently.     Continued Psychosocial Services Needed Yes  Due to Ms. Hitsman's current stressors, she will benefit from the psychoeducational components of this program, particularly stress management and depression.       Psychosocial Re-Evaluation:  Education: Education Goals: Education classes will be provided on a weekly basis, covering required topics. Participant will state understanding/return demonstration of topics presented.  Learning Barriers/Preferences:     Learning Barriers/Preferences - 10/05/14 1130    Learning Barriers/Preferences   Learning Barriers None   Learning Preferences Group Instruction;Individual Instruction;Pictoral;Skilled Demonstration;Verbal Instruction;Video;Written Material      Education Topics: Initial Evaluation Education: - Verbal, written and demonstration of respiratory meds, RPE/PD scales, oximetry and breathing techniques. Instruction on use of nebulizers and MDIs: cleaning and proper use, rinsing mouth with steroid doses and importance of monitoring MDI activations.          Pulmonary Rehab from 11/05/2014 in [place]   Date  10/05/14   Educator  LB   Instruction Review Code  2- meets goals/outcomes      General Nutrition Guidelines/Fats and Fiber: -Group instruction provided by verbal, written material, models and posters to present the general guidelines for heart healthy nutrition. Gives an explanation and review of dietary fats and fiber.   Controlling Sodium/Reading Food Labels: -Group verbal and written material supporting the discussion of sodium use in heart healthy nutrition. Review and explanation with models, verbal  and written materials for utilization of the food label.      Pulmonary Rehab from 11/05/2014 in [place]   Date  10/11/14   Educator  C. Joneen Caraway, RD   Instruction Review Code  2- meets goals/outcomes      Exercise Physiology & Risk Factors: - Group verbal and written instruction with models to review the exercise physiology of the cardiovascular system and associated critical values. Details cardiovascular disease risk factors and the goals associated with each risk factor.      Pulmonary Rehab from 11/05/2014 in [place]   Date  10/27/14   Educator  SW   Instruction Review Code  2- meets goals/outcomes      Aerobic Exercise & Resistance Training: - Gives group verbal and written discussion on the health impact of inactivity. On the components of aerobic and resistive training programs and the benefits of this training and how to safely progress through these programs.   Flexibility, Balance, General Exercise Guidelines: - Provides group verbal and written instruction on the benefits of flexibility and balance training programs. Provides general exercise guidelines with specific guidelines to those with heart or lung disease. Demonstration and skill practice provided.   Stress Management: - Provides group verbal and written instruction about the health risks of elevated stress, cause of high stress, and healthy ways to reduce stress.   Depression: - Provides group verbal and written instruction on the correlation between heart/lung disease and depressed mood, treatment options, and the stigmas associated with seeking treatment.   Exercise & Equipment Safety: - Individual verbal instruction and demonstration of equipment use and safety with use of the equipment.  Pulmonary Rehab from 11/05/2014 in [place]   Date  10/15/14   Educator  C. Enterkin,RN    Instruction Review Code  2- meets goals/outcomes      Infection Prevention: - Provides verbal and written material to individual with discussion of infection control including proper hand washing and proper equipment cleaning during exercise session.      Pulmonary Rehab from 11/05/2014 in [place]   Date  10/11/14   Educator  LB   Instruction Review Code  2- meets goals/outcomes      Falls Prevention: - Provides verbal and written material to individual with discussion of falls prevention and safety.      Pulmonary Rehab from 11/05/2014 in [place]   Date  10/05/14   Educator  LB   Instruction Review Code  2- meets goals/outcomes      Diabetes: - Individual verbal and written instruction to review signs/symptoms of diabetes, desired ranges of glucose level fasting, after meals and with exercise. Advice that pre and post exercise glucose checks will be done for 3 sessions at entry of program.      Pulmonary Rehab from 11/05/2014 in [place]   Date  10/22/14   Educator  CE   Instruction Review Code  2- meets goals/outcomes      Chronic Lung Diseases: - Group verbal and written instruction to review new updates, new respiratory medications, new advancements in procedures and treatments. Provide informative websites and "800" numbers of self-education.   Lung Procedures: - Group verbal and written instruction to describe testing methods done to diagnose lung disease. Review the outcome of test results. Describe the treatment choices: Pulmonary Function Tests, ABGs and oximetry.   Energy Conservation: - Provide group verbal and written instruction for methods to conserve energy, plan and organize activities. Instruct on pacing techniques, use of adaptive equipment and posture/positioning to relieve shortness of breath.   Triggers: - Group verbal and written  instruction to review types of environmental controls: home humidity, furnaces, filters, dust mite/pet prevention, HEPA vacuums. To discuss weather changes, air quality and the benefits of nasal washing.   Exacerbations: - Group verbal and written instruction to provide: warning signs, infection symptoms, calling MD promptly, preventive modes, and value of vaccinations. Review: effective airway clearance, coughing and/or vibration techniques. Create an Sports administrator.   Oxygen: - Individual and group verbal and written instruction on oxygen therapy. Includes supplement oxygen, available portable oxygen systems, continuous and intermittent flow rates, oxygen safety, concentrators, and Medicare reimbursement for oxygen.   Respiratory Medications: - Group verbal and written instruction to review medications for lung disease. Drug class, frequency, complications, importance of spacers, rinsing mouth after steroid MDI's, and proper cleaning methods for nebulizers.      Pulmonary Rehab from 11/05/2014 in [place]   Date  10/05/14   Educator  LB   Instruction Review Code  2- meets goals/outcomes      AED/CPR: - Group verbal and written instruction with the use of models to demonstrate the basic use of the AED with the basic ABC's of resuscitation.   Breathing Retraining: - Provides individuals verbal and written instruction on purpose, frequency, and proper technique of diaphragmatic breathing and pursed-lipped breathing. Applies individual practice skills.      Pulmonary Rehab from 11/05/2014 in [place]   Date  10/05/14   Educator  LB   Instruction Review Code  2- meets goals/outcomes      Anatomy and Physiology of the Lungs: - Group verbal and written instruction with the use of models to provide basic lung anatomy and physiology related to function, structure and complications of lung disease.      Pulmonary  Rehab from 11/05/2014 in [place]   Date  11/05/14   Educator  [place]   Instruction Review Code  2- meets goals/outcomes      Heart Failure: - Group verbal and written instruction on the basics of heart failure: signs/symptoms, treatments, explanation of ejection fraction, enlarged heart and cardiomyopathy.   Sleep Apnea: - Individual verbal and written instruction to review Obstructive Sleep Apnea. Review of risk factors, methods for diagnosing and types of masks and machines for OSA.   Anxiety: - Provides group, verbal and written instruction on the correlation between heart/lung disease and anxiety, treatment options, and management of anxiety.      Pulmonary Rehab from 11/05/2014 in [place]   Date  10/13/14   Educator  [facility]   Instruction Review Code  2- Meets goals/outcomes      Relaxation: - Provides group, verbal and written instruction about the benefits of relaxation for patients with heart/lung disease. Also provides patients with examples of relaxation techniques.   Knowledge Questionnaire Score:     Knowledge Questionnaire Score - 10/05/14 1120    Knowledge Questionnaire Score   Pre Score -2      Personal Goals and Risk Factors at Admission:     Personal Goals and Risk Factors at Admission - 10/05/14 1120    Personal Goals and Risk Factors on Admission    Weight Management No   Increase Aerobic Exercise and Physical Activity Yes   Intervention While in program, learn and follow the exercise prescription taught. Start at a low level workload and increase workload after able to maintain previous level for 30 minutes. Increase time before increasing intensity.  Ms Primeau lives at [facility] and can use their gym .   Understand more about Heart/Pulmonary Disease. Yes   Intervention While in program utilize professionals for any questions, and attend the education sessions. Great websites to  use are www.americanheart.org or www.lung.org for reliable information.  Ms Plaut would be interested in the education on COPD and other education for daily living management.   Improve shortness of breath with ADL's Yes   Intervention While in program, learn and follow the exercise prescription taught. Start at a low level workload and increase workload ad advised by the exercise physiologist. Increase time before increasing intensity.   Develop more efficient breathing techniques such as purse lipped breathing and diaphragmatic breathing; and practicing self-pacing with activity Yes   Intervention While in program, learn and utilize the specific breathing techniques taught to you. Continue to practice and use the techniques as needed.   Increase knowledge of respiratory medications and ability to use respiratory devices properly.  Yes   Intervention While in program, learn to administer MDI, nebulizer, and spacer properly.;Learn to take respiratory medicine as ordered.;While in program, learn to Clean MDI, nebulizers, and spacers properly.  Ms Soltys uses Spiriva, Ventolin MDI and SVN, Symbicort, and a spacer.  she is also on CPAP - full face mask; [employer].   Lipids Yes      Personal Goals and Risk Factors Review:      Goals and Risk Factor Review  10/15/14 1400 10/20/14 1231 10/29/14 1345 11/03/14 1130 11/05/14 1542   Increase Aerobic Exercise and Physical Activity   Goals Progress/Improvement seen  Yes   Yes    Comments Philisha is very happy with her progress so far and has already made several increases on the equipment. Her time on the equipment has been lengthened and she can almost exercise for the enitre class already. She is very eager to continue to make improvements.    Ms Apachito increased her leg press weight today to 55lbs. She is so motivaed to work hard at her exercise goals.    Understand more about Heart/Pulmonary Disease   Goals Progress/Improvement seen   Yes   Yes    Comments  She has only had two education classes so far (diet and relaxation), but she enjoyed these sessions and both had good reminders of topics she already knew.    Education on [employer] was given today.  Ms. Masser was also provided with a few websites to vist to provide more education on heart and lungs.   Improve shortness of breath with ADL's   Goals Progress/Improvement seen   No      Comments  She has not noticed a change in her SOB, the exercise equipment is still challenging for her, but she is making increases and exercising for longer periods. She has not noticed any change with activities she does at home. The heat may be a contributor to her still feeling short of breath with outdoor and household tasks.       Increase knowledge of respiratory medications   Goals Progress/Improvement seen    Yes     Comments   Ms. Guinyard was given a spacer to use with her MDI at home.  She was instructed on how to use it and she was impressed with the whistle feature on this device.       11/15/14 1130           Increase Aerobic Exercise and Physical Activity   Goals Progress/Improvement seen  Yes       Comments Ms Laba has increased her leg press weight to 60lbs.  She has had several increases in her exercise goals, including a 1% grade on the TM.          Personal Goals Discharge (Final Personal Goals and Risk Factors Review):      Goals and Risk Factor Review - 11/15/14 1130    Increase Aerobic Exercise and Physical Activity   Goals Progress/Improvement seen  Yes   Comments Ms Sample has increased her leg press weight to 60lbs.  She has had several increases in her exercise goals, including a 1% grade on the TM.      Comments: Ms. Vida does well using the leg press and was very willing to try that.

## 2014-11-17 NOTE — Progress Notes (Signed)
Daily Session Note  Patient Details  Name: Dorothy Church MRN: [id number] Date of Birth: 10/13/1935 Referring Provider:  Vilinda Boehringer, MD  Encounter Date: 11/17/2014  Check In:     Session Check In - 11/17/14 1210    Check-In   Staff Present Carson Myrtle BS, RRT, Respiratory Therapist;Renee Dillard Essex MS, ACSM CEP Exercise Physiologist;Carroll Enterkin RN, BSN   ER physicians immediately available to respond to emergencies [facility] immediately available ER MD   Physician(s) Dr. Lucita Lora and Dr. Kerman Passey   Medication changes reported     No   Fall or balance concerns reported    No   Warm-up and Cool-down Performed on first and last piece of equipment   VAD Patient? No   Pain Assessment   Currently in Pain? No/denies         Goals Met:  Proper associated with RPD/PD & O2 Sat Exercise tolerated well  Goals Unmet:  Not Applicable  Goals Comments: [name] has increased her work on the Treadmill and it is easier for her.    Dr. Emily Filbert is Medical Director for [facility] and [facility].

## 2014-11-19 ENCOUNTER — Encounter: Payer: Medicare Other | Admitting: *Deleted

## 2014-11-19 ENCOUNTER — Encounter: Payer: Self-pay | Admitting: Respiratory Therapy

## 2014-11-19 DIAGNOSIS — J449 Chronic obstructive pulmonary disease, unspecified: Secondary | ICD-10-CM | POA: Diagnosis not present

## 2014-11-19 NOTE — Progress Notes (Signed)
Daily Session Note  Patient Details  Name: Dorothy Church MRN: [id number] Date of Birth: 1935/07/18 Referring Provider:  Vilinda Boehringer, MD  Encounter Date: 11/19/2014  Check In:     Session Check In - 11/19/14 1218    Check-In   Staff Present Frederich Cha RRT, RCP Respiratory Therapist;Renee Dillard Essex MS, ACSM CEP Exercise Physiologist;Carroll Enterkin RN, BSN   ER physicians immediately available to respond to emergencies [facility] immediately available ER MD   Physician(s) Cinda Quest and [place]   Medication changes reported     No   Fall or balance concerns reported    No   Warm-up and Cool-down Performed on first and last piece of equipment   VAD Patient? No   Pain Assessment   Currently in Pain? No/denies   Multiple Pain Sites No           Exercise Prescription Changes - 11/19/14 1200    Exercise Review   Progression Yes   Response to Exercise   Comments Reviewed individualized exercise prescription and made increases per departmental policy. Exercise increases were discussed with the patient and they were able to perform the new work loads without issue (no signs or symptoms).  Dorothy Church has also been increasing in weight lifted on the leg press almost every session.    Duration Progress to 50 minutes of aerobic without signs/symptoms of physical distress   Intensity THRR unchanged   Progression Continue progressive overload as per policy without signs/symptoms or physical distress.   Resistance Training   Training Prescription Yes   Weight 2   Reps 10-12   Treadmill   MPH 2   Grade 1   Minutes 15   NuStep   Level 3   Watts 45   Minutes 15   Recumbant Elliptical   Level 4  BioStep   RPM 35   Watts 15   Minutes 15      Goals Met:  Proper associated with RPD/PD & O2 Sat Independence with exercise equipment Using PLB without cueing & demonstrates good technique Exercise tolerated well Strength training completed today  Goals Unmet:  Not  Applicable  Goals Comments: Reviewed individualized exercise prescription and made increases per departmental policy. Exercise increases were discussed with the patient and they were able to perform the new work loads without issue (no signs or symptoms).     Dr. Emily Filbert is Medical Director for [place] and [facility].

## 2014-11-22 ENCOUNTER — Encounter: Payer: Medicare Other | Attending: Internal Medicine | Admitting: *Deleted

## 2014-11-22 DIAGNOSIS — J449 Chronic obstructive pulmonary disease, unspecified: Secondary | ICD-10-CM

## 2014-11-22 NOTE — Progress Notes (Signed)
Daily Session Note  Patient Details  Name: Dorothy Church MRN: [id number] Date of Birth: 08-Mar-1935 Referring Provider:  Lucille Passy, MD  Encounter Date: 11/22/2014  Check In:     Session Check In - 11/22/14 1201    Check-In   Staff Present Carson Myrtle BS, RRT, Respiratory Therapist;Schae Cando RN, BSN   ER physicians immediately available to respond to emergencies [facility] immediately available ER MD   Physician(s) Dr. Cinda Quest and Dr. Corky Downs   Medication changes reported     No   Fall or balance concerns reported    No   Warm-up and Cool-down Performed on first and last piece of equipment   VAD Patient? No   Pain Assessment   Currently in Pain? No/denies         Goals Met:  Proper associated with RPD/PD & O2 Sat Exercise tolerated well  Goals Unmet:  Not Applicable  Goals Comments: Nustep Level 4/50 watts increase today.    Dr. Emily Filbert is Medical Director for [facility] and [facility].

## 2014-11-22 NOTE — Progress Notes (Signed)
Pulmonary Individual Treatment Plan  Patient Details  Name: Dorothy Church MRN: [id number] Date of Birth: 03/08/1935 Referring Provider:  Lucille Passy, MD  Initial Encounter Date:    Visit Diagnosis: COPD, moderate ([place])  Patient's Home Medications on Admission:  Current outpatient prescriptions:  .  albuterol (VENTOLIN HFA) 108 (90 BASE) MCG/ACT inhaler, Inhale 2 puffs into the lungs every 6 (six) hours as needed for wheezing., Disp: 18 g, Rfl: 5 .  aspirin 81 MG tablet, Take 81 mg by mouth daily., Disp: , Rfl:  .  budesonide-formoterol (SYMBICORT) 160-4.5 MCG/ACT inhaler, Inhale 2 puffs into the lungs daily as needed. (Patient not taking: Reported on 11/10/2014), Disp: 1 Inhaler, Rfl: 1 .  calcium carbonate (OS-CAL) 600 MG TABS, Take 600 mg by mouth daily., Disp: , Rfl:  .  denosumab (PROLIA) 60 MG/ML SOLN injection, Inject 60 mg into the skin every 6 (six) months. Administer in upper arm, thigh, or abdomen, Disp: , Rfl:  .  esomeprazole (NEXIUM) 40 MG capsule, Take 40 mg by mouth daily before breakfast., Disp: , Rfl:  .  ipratropium-albuterol (DUONEB) 0.5-2.5 (3) MG/3ML SOLN, Take 3 mLs by nebulization every 8 (eight) hours as needed. 1 ampule 3 times a day for 3 days., Disp: 360 mL, Rfl: 3 .  levothyroxine (SYNTHROID, LEVOTHROID) 50 MCG tablet, Take 1 tablet (50 mcg  total) by mouth daily  before breakfast., Disp: 30 tablet, Rfl: 3 .  Multiple Vitamin (MULTIVITAMIN) tablet, Take 1 tablet by mouth daily., Disp: , Rfl:  .  PARoxetine (PAXIL) 30 MG tablet, Take 1 tablet by mouth  every morning, Disp: 90 tablet, Rfl: 1 .  simvastatin (ZOCOR) 40 MG tablet, Take 1 tablet (40 mg total) by mouth daily at 6 PM. NEED TO MAKE APPOINTMENT FOR ANNUAL PHYSICAL, Disp: 90 tablet, Rfl: 0 .  Tiotropium Bromide Monohydrate (SPIRIVA RESPIMAT) 2.5 MCG/ACT AERS, Inhale 2 puffs into the lungs daily. Gargle and rinse after each use., Disp: 3 Inhaler, Rfl: 2  Past Medical History: Past Medical History   Diagnosis Date  . Intrinsic asthma, unspecified   . Aortic valve disorders   . Obstructive sleep apnea (adult) (pediatric)   . Vitamin D deficiency   . Barrett's esophagus   . Impaired fasting glucose   . COPD (chronic obstructive pulmonary disease)   . Syncope and collapse   . Hypothyroidism   . Osteoporosis     Tobacco Use: History  Smoking status  . Never Smoker   Smokeless tobacco  . Never Used    Labs: Recent Review Flowsheet Data    Labs for ITP Cardiac and Pulmonary Rehab Latest Ref Rng 07/21/2012 04/23/2013 09/02/2014   Cholestrol 0 - 200 mg/dL - 149 175   LDLCALC 0 - 99 mg/dL 72 78 93   HDL >39.00 mg/dL 65 55.50 63.80   Trlycerides 0.0 - 149.0 mg/dL 54 77.0 89.0       ADL UCSD:     ADL UCSD      10/05/14 1120 11/19/14 1531     ADL UCSD   ADL Phase  Mid    SOB Score total 42 61    Rest 0 1    Walk 2 1    Stairs 2 2    Bath 3 2    Dress 3 1    Shop 3 2        Pulmonary Function Assessment:     Pulmonary Function Assessment - 10/05/14 1130    Pulmonary Function Tests   RV%  143 %   DLCO% 66 %   Initial Spirometry Results   FVC% 57 %   FEV1% 39 %   FEV1/FVC Ratio 51   Post Bronchodilator Spirometry Results   FVC% 75 %   FEV1% 62 %   FEV1/FVC Ratio 62   Breath   Bilateral Breath Sounds Clear;Decreased      Exercise Target Goals:    Exercise Program Goal: Individual exercise prescription set with THRR, safety & activity barriers. Participant demonstrates ability to understand and report RPE using BORG scale, to self-measure pulse accurately, and to acknowledge the importance of the exercise prescription.  Exercise Prescription Goal: Starting with aerobic activity 30 plus minutes a day, 3 days per week for initial exercise prescription. Provide home exercise prescription and guidelines that participant acknowledges understanding prior to discharge.  Activity Barriers & Risk Stratification:     Activity Barriers & Risk Stratification -  10/05/14 1120    Activity Barriers & Risk Stratification   Activity Barriers Shortness of Breath   Risk Stratification Low      6 Minute Walk:     6 Minute Walk      10/05/14 1120 10/07/14 0854 11/19/14 1238   6 Minute Walk   Phase Initial Initial Mid Program   Distance 1140 feet 1140 feet 1440 feet   Walk Time 6 minutes 6 minutes 6 minutes   Resting HR 80 bpm 80 bpm 89 bpm   Resting BP 110/60 mmHg 110/60 mmHg 114/62 mmHg   Max Ex. HR 114 bpm 114 bpm 130 bpm   Max Ex. BP 124/68 mmHg 124/68 mmHg 150/68 mmHg   RPE '15 15 13   ' Perceived Dyspnea  '3 3 3   ' Symptoms  No No      Initial Exercise Prescription:     Initial Exercise Prescription - 10/07/14 0800    Date of Initial Exercise Prescription   Date 10/05/14   Treadmill   MPH 1.5   Grade 0   Minutes 10   Recumbant Bike   Level 2   RPM 40   Watts 20   Minutes 10   NuStep   Level 2   Watts 40   Minutes 10   Arm Ergometer   Level 1   Watts 10   Minutes 10   REL-XR   Level 2   Watts 40   Minutes 10   Prescription Details   Frequency (times per week) 3   Duration Progress to 30 minutes of continuous aerobic without signs/symptoms of physical distress   Intensity   THRR REST +  30   Ratings of Perceived Exertion 11-15   Perceived Dyspnea 2-4   Progression Continue progressive overload as per policy without signs/symptoms or physical distress.   Resistance Training   Training Prescription Yes   Weight 2   Reps 10-15      Exercise Prescription Changes:     Exercise Prescription Changes      10/11/14 1400 10/15/14 1100 10/18/14 1200 10/20/14 1200 10/22/14 1200   Exercise Review   Progression    Yes Yes   Response to Exercise   Blood Pressure (Admit) 100/64 mmHg 100/64 mmHg 100/64 mmHg     Blood Pressure (Exercise) 122/62 mmHg 122/62 mmHg 122/62 mmHg     Blood Pressure (Exit) 116/72 mmHg 116/72 mmHg 116/72 mmHg     Heart Rate (Admit) 87 bpm 87 bpm 87 bpm     Heart Rate (Exercise) 120 bpm 120 bpm 120  bpm  Heart Rate (Exit) 83 bpm 83 bpm 83 bpm     Oxygen Saturation (Admit) 94 % 94 % 94 %     Oxygen Saturation (Exercise) 95 % 95 % 95 %     Oxygen Saturation (Exit) 97 % 97 % 97 %     Rating of Perceived Exertion (Exercise) '13 13 13     ' Perceived Dyspnea (Exercise) '2 2 2     ' Duration    Progress to 50 minutes of aerobic without signs/symptoms of physical distress Progress to 50 minutes of aerobic without signs/symptoms of physical distress   Intensity    THRR unchanged THRR unchanged   Progression    Continue progressive overload as per policy without signs/symptoms or physical distress. Continue progressive overload as per policy without signs/symptoms or physical distress.   Resistance Training   Training Prescription Yes Yes Yes Yes Yes   Weight '2 2 2 2 2   ' Reps 10-12 10-12 10-12 10-12 10-12   Treadmill   MPH 1.5 1.5 1.8 1.8 1.8   Grade 0 0 0 0 0   Minutes '10 10 10 12 12   ' NuStep   Level '2 2 2 2 3   ' Watts 40 40 40 40 45   Minutes '10 10 15 15 15   ' Recumbant Elliptical   Level 2  BioStep 2  BioStep 2  BioStep 2  BioStep 2  BioStep   RPM '30 30 30 30 30   ' Watts '10 15 15 15 15     ' 10/29/14 1200 11/01/14 1600 11/08/14 1200 11/15/14 1200 11/19/14 1200   Exercise Review   Progression Yes Yes Yes Yes Yes   Response to Exercise   Blood Pressure (Admit)  100/60 mmHg      Blood Pressure (Exercise)  146/68 mmHg      Blood Pressure (Exit)  100/60 mmHg      Heart Rate (Admit)  114 bpm      Heart Rate (Exercise)  108 bpm      Heart Rate (Exit)  84 bpm      Oxygen Saturation (Admit)  96 %      Oxygen Saturation (Exercise)  95 %      Oxygen Saturation (Exit)  92 %      Rating of Perceived Exertion (Exercise)  13      Perceived Dyspnea (Exercise)  2      Comments     Reviewed individualized exercise prescription and made increases per departmental policy. Exercise increases were discussed with the patient and they were able to perform the new work loads without issue (no signs or  symptoms).  Mava has also been increasing in weight lifted on the leg press almost every session. Completed the mid program 6MW test and did very well.     Duration Progress to 50 minutes of aerobic without signs/symptoms of physical distress Progress to 50 minutes of aerobic without signs/symptoms of physical distress Progress to 50 minutes of aerobic without signs/symptoms of physical distress Progress to 50 minutes of aerobic without signs/symptoms of physical distress Progress to 50 minutes of aerobic without signs/symptoms of physical distress   Intensity THRR unchanged THRR unchanged THRR unchanged THRR unchanged THRR unchanged   Progression Continue progressive overload as per policy without signs/symptoms or physical distress. Continue progressive overload as per policy without signs/symptoms or physical distress. Continue progressive overload as per policy without signs/symptoms or physical distress. Continue progressive overload as per policy without signs/symptoms or physical distress. Continue progressive  overload as per policy without signs/symptoms or physical distress.   Resistance Training   Training Prescription Yes Yes Yes Yes Yes   Weight '2 2 2 2 2   ' Reps 10-12 10-12 10-12 10-12 10-12   Treadmill   MPH 1.'8 2 2 2 2   ' Grade 0 0 0 1 1   Minutes '15 15 15 15 15   ' NuStep   Level '3 3 3 3 3   ' Watts 30 45 45 45 45   Minutes '15 15 15 15 15   ' Recumbant Elliptical   Level 2  BioStep 3  BioStep 4  BioStep 4  BioStep 4  BioStep   RPM 30 30 35 35 35   Watts '15 15 15 15 15   ' Minutes    15 15     11/22/14 1200           Exercise Review   Progression Yes       Response to Exercise   Comments Reviewed individualized exercise prescription and made increases per departmental policy. Exercise increases were discussed with the patient and they were able to perform the new work loads without issue (no signs or symptoms).  Countess has also been increasing in weight lifted on the leg  press almost every session. Completed the mid program 6MW test and did very well.         Duration Progress to 50 minutes of aerobic without signs/symptoms of physical distress       Intensity THRR unchanged       Progression Continue progressive overload as per policy without signs/symptoms or physical distress.       Resistance Training   Training Prescription Yes       Weight 2       Reps 10-12       Treadmill   MPH 2       Grade 1       Minutes 15       NuStep   Level 4       Watts 50       Minutes 15       Recumbant Elliptical   Level 4  BioStep       RPM 35       Watts 15       Minutes 15          Discharge Exercise Prescription (Final Exercise Prescription Changes):     Exercise Prescription Changes - 11/22/14 1200    Exercise Review   Progression Yes   Response to Exercise   Comments Reviewed individualized exercise prescription and made increases per departmental policy. Exercise increases were discussed with the patient and they were able to perform the new work loads without issue (no signs or symptoms).  Mairen has also been increasing in weight lifted on the leg press almost every session. Completed the mid program 6MW test and did very well.     Duration Progress to 50 minutes of aerobic without signs/symptoms of physical distress   Intensity THRR unchanged   Progression Continue progressive overload as per policy without signs/symptoms or physical distress.   Resistance Training   Training Prescription Yes   Weight 2   Reps 10-12   Treadmill   MPH 2   Grade 1   Minutes 15   NuStep   Level 4   Watts 50   Minutes 15   Recumbant Elliptical   Level 4  BioStep   RPM 35  Watts 15   Minutes 15       Nutrition:  Target Goals: Understanding of nutrition guidelines, daily intake of sodium <1557m, cholesterol <2040m calories 30% from fat and 7% or less from saturated fats, daily to have 5 or more servings of fruits and vegetables.  Biometrics:      Pre Biometrics - 10/07/14 0856    Pre Biometrics   Height '5\' 7"'  (1.702 m)   Weight 144 lb 9.6 oz (65.59 kg)   Waist Circumference 32 inches   Hip Circumference 38 inches   Waist to Hip Ratio 0.84 %   BMI (Calculated) 22.7       Nutrition Therapy Plan and Nutrition Goals:     Nutrition Therapy & Goals - 10/05/14 1120    Nutrition Therapy   Diet Prefers not to meet with the dietitian; She eats fruits and vegetables mainly      Nutrition Discharge: Rate Your Plate Scores:   Psychosocial: Target Goals: Acknowledge presence or absence of depression, maximize coping skills, provide positive support system. Participant is able to verbalize types and ability to use techniques and skills needed for reducing stress and depression.  Initial Review & Psychosocial Screening:     Initial Psych Review & Screening - 10/05/14 1120    Family Dynamics   Good Support System? Yes  Ms DuDespainas good support from her son and friends.   Barriers   Psychosocial barriers to participate in program There are no identifiable barriers or psychosocial needs.;The patient should benefit from training in stress management and relaxation.   Screening Interventions   Interventions Encouraged to exercise      Quality of Life Scores:   PHQ-9:     Recent Review Flowsheet Data    [facility] 10/05/2014 09/02/2014 04/23/2013   Decreased Interest 0 0 0   Down, Depressed, Hopeless 0 0 0   PHQ - 2 Score 0 0 0      Psychosocial Evaluation and Intervention:     Psychosocial Evaluation - 10/27/14 1246    Psychosocial Evaluation & Interventions   Interventions Stress management education;Relaxation education;Encouraged to exercise with the program and follow exercise prescription   Comments Counselor met with Ms. DuLynamoday for initial psychosocial evaluation.  She is a [age]ear old who has been diagnosed with Emphysema about a year ago.  She lives alone but reports a strong support system  living in a retirement community and actively involved in her local church.  She also has sleep apnea and uses a CPAP.  She denies a history of depression or current symptoms.  She admits her mood is typically positive/optimistic but she does have a daughter with some mental health issues and Ms. D's own health issues that cause some stress on occasion.  She has goals to improve her overall health, increase her stamina and strength and to learn to exercise more consistently.     Continued Psychosocial Services Needed Yes  Due to Ms. Antkowiak's current stressors, she will benefit from the psychoeducational components of this program, particularly stress management and depression.       Psychosocial Re-Evaluation:  Education: Education Goals: Education classes will be provided on a weekly basis, covering required topics. Participant will state understanding/return demonstration of topics presented.  Learning Barriers/Preferences:     Learning Barriers/Preferences - 10/05/14 1130    Learning Barriers/Preferences   Learning Barriers None   Learning Preferences Group Instruction;Individual Instruction;Pictoral;Skilled Demonstration;Verbal Instruction;Video;Written Material      Education Topics:  Initial Evaluation Education: - Verbal, written and demonstration of respiratory meds, RPE/PD scales, oximetry and breathing techniques. Instruction on use of nebulizers and MDIs: cleaning and proper use, rinsing mouth with steroid doses and importance of monitoring MDI activations.          Pulmonary Rehab from 11/22/2014 in [place]   Date  10/05/14   Educator  LB   Instruction Review Code  2- meets goals/outcomes      General Nutrition Guidelines/Fats and Fiber: -Group instruction provided by verbal, written material, models and posters to present the general guidelines for heart healthy nutrition. Gives an explanation and review of dietary fats and  fiber.   Controlling Sodium/Reading Food Labels: -Group verbal and written material supporting the discussion of sodium use in heart healthy nutrition. Review and explanation with models, verbal and written materials for utilization of the food label.      Pulmonary Rehab from 11/22/2014 in [place]   Date  10/11/14   Educator  C. Joneen Caraway, RD   Instruction Review Code  2- meets goals/outcomes      Exercise Physiology & Risk Factors: - Group verbal and written instruction with models to review the exercise physiology of the cardiovascular system and associated critical values. Details cardiovascular disease risk factors and the goals associated with each risk factor.      Pulmonary Rehab from 11/22/2014 in [place]   Date  10/27/14   Educator  SW   Instruction Review Code  2- meets goals/outcomes      Aerobic Exercise & Resistance Training: - Gives group verbal and written discussion on the health impact of inactivity. On the components of aerobic and resistive training programs and the benefits of this training and how to safely progress through these programs.   Flexibility, Balance, General Exercise Guidelines: - Provides group verbal and written instruction on the benefits of flexibility and balance training programs. Provides general exercise guidelines with specific guidelines to those with heart or lung disease. Demonstration and skill practice provided.   Stress Management: - Provides group verbal and written instruction about the health risks of elevated stress, cause of high stress, and healthy ways to reduce stress.   Depression: - Provides group verbal and written instruction on the correlation between heart/lung disease and depressed mood, treatment options, and the stigmas associated with seeking treatment.   Exercise & Equipment Safety: - Individual verbal instruction and demonstration of  equipment use and safety with use of the equipment.      Pulmonary Rehab from 11/22/2014 in [place]   Date  11/22/14   Educator  C. Akaisha Truman,RN   Instruction Review Code  2- meets goals/outcomes      Infection Prevention: - Provides verbal and written material to individual with discussion of infection control including proper hand washing and proper equipment cleaning during exercise session.      Pulmonary Rehab from 11/22/2014 in [place]   Date  10/11/14   Educator  LB   Instruction Review Code  2- meets goals/outcomes      Falls Prevention: - Provides verbal and written material to individual with discussion of falls prevention and safety.      Pulmonary Rehab from 11/22/2014 in [place]   Date  10/05/14   Educator  LB   Instruction Review Code  2- meets goals/outcomes  Diabetes: - Individual verbal and written instruction to review signs/symptoms of diabetes, desired ranges of glucose level fasting, after meals and with exercise. Advice that pre and post exercise glucose checks will be done for 3 sessions at entry of program.      Pulmonary Rehab from 11/22/2014 in [place]   Date  10/22/14   Educator  CE   Instruction Review Code  2- meets goals/outcomes      Chronic Lung Diseases: - Group verbal and written instruction to review new updates, new respiratory medications, new advancements in procedures and treatments. Provide informative websites and "800" numbers of self-education.   Lung Procedures: - Group verbal and written instruction to describe testing methods done to diagnose lung disease. Review the outcome of test results. Describe the treatment choices: Pulmonary Function Tests, ABGs and oximetry.   Energy Conservation: - Provide group verbal and written instruction for methods to conserve energy,  plan and organize activities. Instruct on pacing techniques, use of adaptive equipment and posture/positioning to relieve shortness of breath.   Triggers: - Group verbal and written instruction to review types of environmental controls: home humidity, furnaces, filters, dust mite/pet prevention, HEPA vacuums. To discuss weather changes, air quality and the benefits of nasal washing.   Exacerbations: - Group verbal and written instruction to provide: warning signs, infection symptoms, calling MD promptly, preventive modes, and value of vaccinations. Review: effective airway clearance, coughing and/or vibration techniques. Create an Sports administrator.   Oxygen: - Individual and group verbal and written instruction on oxygen therapy. Includes supplement oxygen, available portable oxygen systems, continuous and intermittent flow rates, oxygen safety, concentrators, and Medicare reimbursement for oxygen.   Respiratory Medications: - Group verbal and written instruction to review medications for lung disease. Drug class, frequency, complications, importance of spacers, rinsing mouth after steroid MDI's, and proper cleaning methods for nebulizers.      Pulmonary Rehab from 11/22/2014 in [place]   Date  10/05/14   Educator  LB   Instruction Review Code  2- meets goals/outcomes      AED/CPR: - Group verbal and written instruction with the use of models to demonstrate the basic use of the AED with the basic ABC's of resuscitation.   Breathing Retraining: - Provides individuals verbal and written instruction on purpose, frequency, and proper technique of diaphragmatic breathing and pursed-lipped breathing. Applies individual practice skills.      Pulmonary Rehab from 11/22/2014 in [place]   Date  10/05/14   Educator  LB   Instruction Review Code  2- meets goals/outcomes      Anatomy and Physiology of the Lungs: -  Group verbal and written instruction with the use of models to provide basic lung anatomy and physiology related to function, structure and complications of lung disease.      Pulmonary Rehab from 11/22/2014 in [place]   Date  11/05/14   Educator  [place]   Instruction Review Code  2- meets goals/outcomes      Heart Failure: - Group verbal and written instruction on the basics of heart failure: signs/symptoms, treatments, explanation of ejection fraction, enlarged heart and cardiomyopathy.   Sleep Apnea: - Individual verbal and written instruction to review Obstructive Sleep Apnea. Review of risk factors, methods for diagnosing and types of masks and machines for OSA.   Anxiety: - Provides group, verbal and written instruction on the correlation between heart/lung disease and  anxiety, treatment options, and management of anxiety.      Pulmonary Rehab from 11/22/2014 in [place]   Date  10/13/14   Educator  [facility]   Instruction Review Code  2- Meets goals/outcomes      Relaxation: - Provides group, verbal and written instruction about the benefits of relaxation for patients with heart/lung disease. Also provides patients with examples of relaxation techniques.   Knowledge Questionnaire Score:     Knowledge Questionnaire Score - 10/05/14 1120    Knowledge Questionnaire Score   Pre Score -2      Personal Goals and Risk Factors at Admission:     Personal Goals and Risk Factors at Admission - 10/05/14 1120    Personal Goals and Risk Factors on Admission    Weight Management No   Increase Aerobic Exercise and Physical Activity Yes   Intervention While in program, learn and follow the exercise prescription taught. Start at a low level workload and increase workload after able to maintain previous level for 30 minutes. Increase time before increasing intensity.  Ms Martinez lives at [facility] and can use their  gym .   Understand more about Heart/Pulmonary Disease. Yes   Intervention While in program utilize professionals for any questions, and attend the education sessions. Great websites to use are www.americanheart.org or www.lung.org for reliable information.  Ms Hogenson would be interested in the education on COPD and other education for daily living management.   Improve shortness of breath with ADL's Yes   Intervention While in program, learn and follow the exercise prescription taught. Start at a low level workload and increase workload ad advised by the exercise physiologist. Increase time before increasing intensity.   Develop more efficient breathing techniques such as purse lipped breathing and diaphragmatic breathing; and practicing self-pacing with activity Yes   Intervention While in program, learn and utilize the specific breathing techniques taught to you. Continue to practice and use the techniques as needed.   Increase knowledge of respiratory medications and ability to use respiratory devices properly.  Yes   Intervention While in program, learn to administer MDI, nebulizer, and spacer properly.;Learn to take respiratory medicine as ordered.;While in program, learn to Clean MDI, nebulizers, and spacers properly.  Ms Maeda uses Spiriva, Ventolin MDI and SVN, Symbicort, and a spacer.  she is also on CPAP - full face mask; [employer].   Lipids Yes      Personal Goals and Risk Factors Review:      Goals and Risk Factor Review      10/15/14 1400 10/20/14 1231 10/29/14 1345 11/03/14 1130 11/05/14 1542   Increase Aerobic Exercise and Physical Activity   Goals Progress/Improvement seen  Yes   Yes    Comments Lakeshia is very happy with her progress so far and has already made several increases on the equipment. Her time on the equipment has been lengthened and she can almost exercise for the enitre class already. She is very eager to continue to make improvements.    Ms Boling increased her  leg press weight today to 55lbs. She is so motivaed to work hard at her exercise goals.    Understand more about Heart/Pulmonary Disease   Goals Progress/Improvement seen   Yes   Yes   Comments  She has only had two education classes so far (diet and relaxation), but she enjoyed these sessions and both had good reminders of topics she already knew.    Education on [employer]  was given today.  Ms. Shiel was also provided with a few websites to vist to provide more education on heart and lungs.   Improve shortness of breath with ADL's   Goals Progress/Improvement seen   No      Comments  She has not noticed a change in her SOB, the exercise equipment is still challenging for her, but she is making increases and exercising for longer periods. She has not noticed any change with activities she does at home. The heat may be a contributor to her still feeling short of breath with outdoor and household tasks.       Increase knowledge of respiratory medications   Goals Progress/Improvement seen    Yes     Comments   Ms. Crumby was given a spacer to use with her MDI at home.  She was instructed on how to use it and she was impressed with the whistle feature on this device.       11/15/14 1130           Increase Aerobic Exercise and Physical Activity   Goals Progress/Improvement seen  Yes       Comments Ms Bean has increased her leg press weight to 60lbs.  She has had several increases in her exercise goals, including a 1% grade on the TM.          Personal Goals Discharge (Final Personal Goals and Risk Factors Review):      Goals and Risk Factor Review - 11/15/14 1130    Increase Aerobic Exercise and Physical Activity   Goals Progress/Improvement seen  Yes   Comments Ms Goshorn has increased her leg press weight to 60lbs.  She has had several increases in her exercise goals, including a 1% grade on the TM.      Comments: Haily is doing well in Pulm Rehab on Level 4.

## 2014-11-23 NOTE — Progress Notes (Signed)
Daily Session Note  Patient Details  Name: Dorothy Church MRN: [id number] Date of Birth: [birth year] Referring Provider:  Vilinda Boehringer, MD  Encounter Date: 11/22/2014  Check In:     Session Check In - 11/22/14 1201    Check-In   Staff Present Carson Myrtle BS, RRT, Respiratory Therapist;Carroll Enterkin RN, BSN   ER physicians immediately available to respond to emergencies [facility] immediately available ER MD   Physician(s) Dr. Cinda Quest and Dr. Corky Downs   Medication changes reported     No   Fall or balance concerns reported    No   Warm-up and Cool-down Performed on first and last piece of equipment   VAD Patient? No   Pain Assessment   Currently in Pain? No/denies           Exercise Prescription Changes - 11/22/14 1200    Exercise Review   Progression Yes   Response to Exercise   Comments Reviewed individualized exercise prescription and made increases per departmental policy. Exercise increases were discussed with the patient and they were able to perform the new work loads without issue (no signs or symptoms).  [name] has also been increasing in weight lifted on the leg press almost every session. Completed the mid program 6MW test and did very well.     Duration Progress to 50 minutes of aerobic without signs/symptoms of physical distress   Intensity THRR unchanged   Progression Continue progressive overload as per policy without signs/symptoms or physical distress.   Resistance Training   Training Prescription Yes   Weight 2   Reps 10-12   Treadmill   MPH 2   Grade 1   Minutes 15   NuStep   Level 4   Watts 50   Minutes 15   Recumbant Elliptical   Level 4  BioStep   RPM 35   Watts 15   Minutes 15      Goals Met:  Proper associated with RPD/PD & O2 Sat Independence with exercise equipment Using PLB without cueing & demonstrates good technique Exercise tolerated well Personal goals reviewed Strength training completed today  Goals Unmet:  Not  Applicable  Goals Comments: [name] increased her leg press weight to 70lbs. Reviewed individualized exercise prescription and made increases per departmental policy. Exercise increases were discussed with the patient and they were able to perform the new work loads without issue (no signs or symptoms).     Dr. Emily Filbert is Medical Director for [place] and [facility].

## 2014-11-24 ENCOUNTER — Encounter: Payer: Medicare Other | Admitting: *Deleted

## 2014-11-24 DIAGNOSIS — J449 Chronic obstructive pulmonary disease, unspecified: Secondary | ICD-10-CM | POA: Diagnosis not present

## 2014-11-24 NOTE — Progress Notes (Signed)
Daily Session Note  Patient Details  Name: Dorothy Church MRN: [id number] Date of Birth: 01/27/1936 Referring Provider:  Mungal, Vishal, MD  Encounter Date: 11/24/2014  Check In:     Session Check In - 11/24/14 1219    Check-In   Staff Present Laureen Brown BS, RRT, Respiratory Therapist;Carroll Enterkin RN, BSN;Steven Way BS, ACSM EP-C, Exercise Physiologist   ER physicians immediately available to respond to emergencies [facility] immediately available ER MD   Physician(s) Dr. Schaevit and DR. Yao   Medication changes reported     No   Fall or balance concerns reported    No   Warm-up and Cool-down Performed on first and last piece of equipment   VAD Patient? No   Pain Assessment   Currently in Pain? No/denies         Goals Met:  Proper associated with RPD/PD & O2 Sat Exercise tolerated well  Goals Unmet:  Not Applicable  Goals Comments:    Dr. Mark Miller is Medical Director for [facility] and [facility]. 

## 2014-11-26 ENCOUNTER — Encounter: Payer: Medicare Other | Admitting: Respiratory Therapy

## 2014-11-26 DIAGNOSIS — J449 Chronic obstructive pulmonary disease, unspecified: Secondary | ICD-10-CM

## 2014-11-26 DIAGNOSIS — G4733 Obstructive sleep apnea (adult) (pediatric): Secondary | ICD-10-CM

## 2014-11-26 NOTE — Progress Notes (Signed)
Daily Session Note  Patient Details  Name: Dorothy Church MRN: [id number] Date of Birth: [birth year] Referring Provider:  Vilinda Boehringer, MD  Encounter Date: 11/26/2014  Check In:     Session Check In - 11/26/14 1402    Check-In   Staff Present Frederich Cha RRT, RCP Respiratory Therapist;Renee Dillard [place] MS, ACSM CEP Exercise Physiologist;Other  Judithann Sheen   ER physicians immediately available to respond to emergencies [facility] immediately available ER MD   Physician(s) Dr. Jacqualine Code and Dr. Darl Householder   Medication changes reported     No   Fall or balance concerns reported    No   Warm-up and Cool-down Performed on first and last piece of equipment   VAD Patient? No   Pain Assessment   Currently in Pain? No/denies   Multiple Pain Sites No         Goals Met:  Proper associated with RPD/PD & O2 Sat Independence with exercise equipment Exercise tolerated well No report of cardiac concerns or symptoms Strength training completed today  Goals Unmet:  Not Applicable  Goals Comments:    Dr. Emily Filbert is Medical Director for [place] and [facility].

## 2014-11-29 ENCOUNTER — Encounter: Payer: Medicare Other | Admitting: *Deleted

## 2014-11-29 DIAGNOSIS — J449 Chronic obstructive pulmonary disease, unspecified: Secondary | ICD-10-CM

## 2014-11-29 NOTE — Progress Notes (Signed)
Daily Session Note  Patient Details  Name: Dorothy Church MRN: [id number] Date of Birth: [birth year] Referring Provider:  Vilinda Boehringer, MD  Encounter Date: 11/29/2014  Check In:     Session Check In - 11/29/14 1153    Check-In   Staff Present Laureen Owens Shark BS, RRT, Respiratory Therapist;Carlo Guevarra RN, BSN;Steven Way BS, ACSM EP-C, Exercise Physiologist   ER physicians immediately available to respond to emergencies [facility] immediately available ER MD   Physician(s) Dr. Clearnce Hasten and Dr. Corky Downs   Medication changes reported     No   Fall or balance concerns reported    No   Warm-up and Cool-down Performed on first and last piece of equipment   VAD Patient? No   Pain Assessment   Currently in Pain? No/denies           Exercise Prescription Changes - 11/29/14 1100    Exercise Review   Progression Yes   Recumbant Elliptical   Level 5  Biostep   Watts 45      Goals Met:  Proper associated with RPD/PD & O2 Sat Exercise tolerated well  Goals Unmet:  Not Applicable  Goals Comments: 30 day note day   Dr. Emily Filbert is Medical Director for [place] and [facility].

## 2014-11-29 NOTE — Progress Notes (Signed)
Pulmonary Individual Treatment Plan  Patient Details  Name: Dorothy Church MRN: [id number] Date of Birth: 12-09-1935 Referring Provider:  Vilinda Boehringer, MD  Initial Encounter Date:    Visit Diagnosis: COPD, moderate ([place])  Patient's Home Medications on Admission:  Current outpatient prescriptions:  .  albuterol (VENTOLIN HFA) 108 (90 BASE) MCG/ACT inhaler, Inhale 2 puffs into the lungs every 6 (six) hours as needed for wheezing., Disp: 18 g, Rfl: 5 .  aspirin 81 MG tablet, Take 81 mg by mouth daily., Disp: , Rfl:  .  budesonide-formoterol (SYMBICORT) 160-4.5 MCG/ACT inhaler, Inhale 2 puffs into the lungs daily as needed. (Patient not taking: Reported on 11/10/2014), Disp: 1 Inhaler, Rfl: 1 .  calcium carbonate (OS-CAL) 600 MG TABS, Take 600 mg by mouth daily., Disp: , Rfl:  .  denosumab (PROLIA) 60 MG/ML SOLN injection, Inject 60 mg into the skin every 6 (six) months. Administer in upper arm, thigh, or abdomen, Disp: , Rfl:  .  esomeprazole (NEXIUM) 40 MG capsule, Take 40 mg by mouth daily before breakfast., Disp: , Rfl:  .  ipratropium-albuterol (DUONEB) 0.5-2.5 (3) MG/3ML SOLN, Take 3 mLs by nebulization every 8 (eight) hours as needed. 1 ampule 3 times a day for 3 days., Disp: 360 mL, Rfl: 3 .  levothyroxine (SYNTHROID, LEVOTHROID) 50 MCG tablet, Take 1 tablet (50 mcg  total) by mouth daily  before breakfast., Disp: 30 tablet, Rfl: 3 .  Multiple Vitamin (MULTIVITAMIN) tablet, Take 1 tablet by mouth daily., Disp: , Rfl:  .  PARoxetine (PAXIL) 30 MG tablet, Take 1 tablet by mouth  every morning, Disp: 90 tablet, Rfl: 1 .  simvastatin (ZOCOR) 40 MG tablet, Take 1 tablet (40 mg total) by mouth daily at 6 PM. NEED TO MAKE APPOINTMENT FOR ANNUAL PHYSICAL, Disp: 90 tablet, Rfl: 0 .  Tiotropium Bromide Monohydrate (SPIRIVA RESPIMAT) 2.5 MCG/ACT AERS, Inhale 2 puffs into the lungs daily. Gargle and rinse after each use., Disp: 3 Inhaler, Rfl: 2  Past Medical History: Past Medical History   Diagnosis Date  . Intrinsic asthma, unspecified   . Aortic valve disorders   . Obstructive sleep apnea (adult) (pediatric)   . Vitamin D deficiency   . Barrett's esophagus   . Impaired fasting glucose   . COPD (chronic obstructive pulmonary disease)   . Syncope and collapse   . Hypothyroidism   . Osteoporosis     Tobacco Use: History  Smoking status  . Never Smoker   Smokeless tobacco  . Never Used    Labs: Recent Review Flowsheet Data    Labs for ITP Cardiac and Pulmonary Rehab Latest Ref Rng 07/21/2012 04/23/2013 09/02/2014   Cholestrol 0 - 200 mg/dL - 149 175   LDLCALC 0 - 99 mg/dL 72 78 93   HDL >39.00 mg/dL 65 55.50 63.80   Trlycerides 0.0 - 149.0 mg/dL 54 77.0 89.0       ADL UCSD:     ADL UCSD      10/05/14 1120 11/19/14 1531     ADL UCSD   ADL Phase  Mid    SOB Score total 42 61    Rest 0 1    Walk 2 1    Stairs 2 2    Bath 3 2    Dress 3 1    Shop 3 2        Pulmonary Function Assessment:     Pulmonary Function Assessment - 10/05/14 1130    Pulmonary Function Tests   RV% 143 %  DLCO% 66 %   Initial Spirometry Results   FVC% 57 %   FEV1% 39 %   FEV1/FVC Ratio 51   Post Bronchodilator Spirometry Results   FVC% 75 %   FEV1% 62 %   FEV1/FVC Ratio 62   Breath   Bilateral Breath Sounds Clear;Decreased      Exercise Target Goals:    Exercise Program Goal: Individual exercise prescription set with THRR, safety & activity barriers. Participant demonstrates ability to understand and report RPE using BORG scale, to self-measure pulse accurately, and to acknowledge the importance of the exercise prescription.  Exercise Prescription Goal: Starting with aerobic activity 30 plus minutes a day, 3 days per week for initial exercise prescription. Provide home exercise prescription and guidelines that participant acknowledges understanding prior to discharge.  Activity Barriers & Risk Stratification:     Activity Barriers & Risk Stratification -  10/05/14 1120    Activity Barriers & Risk Stratification   Activity Barriers Shortness of Breath   Risk Stratification Low      6 Minute Walk:     6 Minute Walk      10/05/14 1120 10/07/14 0854 11/19/14 1238   6 Minute Walk   Phase Initial Initial Mid Program   Distance 1140 feet 1140 feet 1440 feet   Walk Time 6 minutes 6 minutes 6 minutes   Resting HR 80 bpm 80 bpm 89 bpm   Resting BP 110/60 mmHg 110/60 mmHg 114/62 mmHg   Max Ex. HR 114 bpm 114 bpm 130 bpm   Max Ex. BP 124/68 mmHg 124/68 mmHg 150/68 mmHg   RPE _0 Perceived Dyspnea  _1 Symptoms  No No      Initial Exercise Prescription:     Initial Exercise Prescription - 10/07/14 0800    Date of Initial Exercise Prescription   Date 10/05/14   Treadmill   MPH 1.5   Grade 0   Minutes 10   Recumbant Bike   Level 2   RPM 40   Watts 20   Minutes 10   NuStep   Level 2   Watts 40   Minutes 10   Arm Ergometer   Level 1   Watts 10   Minutes 10   REL-XR   Level 2   Watts 40   Minutes 10   Prescription Details   Frequency (times per week) 3   Duration Progress to 30 minutes of continuous aerobic without signs/symptoms of physical distress   Intensity   THRR REST +  30   Ratings of Perceived Exertion 11-15   Perceived Dyspnea 2-4   Progression Continue progressive overload as per policy without signs/symptoms or physical distress.   Resistance Training   Training Prescription Yes   Weight 2   Reps 10-15      Exercise Prescription Changes:     Exercise Prescription Changes      10/11/14 1400 10/15/14 1100 10/18/14 1200 10/20/14 1200 10/22/14 1200   Exercise Review   Progression    Yes Yes   Response to Exercise   Blood Pressure (Admit) 100/64 mmHg 100/64 mmHg 100/64 mmHg     Blood Pressure (Exercise) 122/62 mmHg 122/62 mmHg 122/62 mmHg     Blood Pressure (Exit) 116/72 mmHg 116/72 mmHg 116/72 mmHg     Heart Rate (Admit) 87 bpm 87 bpm 87 bpm     Heart Rate (Exercise) 120 bpm 120 bpm 120  bpm     Heart Rate (  Exit) 83 bpm 83 bpm 83 bpm     Oxygen Saturation (Admit) 94 % 94 % 94 %     Oxygen Saturation (Exercise) 95 % 95 % 95 %     Oxygen Saturation (Exit) 97 % 97 % 97 %     Rating of Perceived Exertion (Exercise) _0 Perceived Dyspnea (Exercise) _1 Duration    Progress to 50 minutes of aerobic without signs/symptoms of physical distress Progress to 50 minutes of aerobic without signs/symptoms of physical distress   Intensity    THRR unchanged THRR unchanged   Progression    Continue progressive overload as per policy without signs/symptoms or physical distress. Continue progressive overload as per policy without signs/symptoms or physical distress.   Resistance Training   Training Prescription _2    Weight _3 Reps 10-12 10-12 10-12 10-12 10-12   Treadmill   MPH 1.5 1.5 1.8 1.8 1.8   Grade 0 0 0 0 0   Minutes _4 NuStep   Level _5 Watts 40 40 40 40 45   Minutes _6 Recumbant Elliptical   Level 2  BioStep 2  BioStep 2  BioStep 2  BioStep 2  BioStep   RPM _7 Watts _8 10/29/14 1200 11/01/14 1600 11/08/14 1200 11/15/14 1200 11/19/14 1200   Exercise Review   Progression _9    Response to Exercise   Blood Pressure (Admit)  100/60 mmHg      Blood Pressure (Exercise)  146/68 mmHg      Blood Pressure (Exit)  100/60 mmHg      Heart Rate (Admit)  114 bpm      Heart Rate (Exercise)  108 bpm      Heart Rate (Exit)  84 bpm      Oxygen Saturation (Admit)  96 %      Oxygen Saturation (Exercise)  95 %      Oxygen Saturation (Exit)  92 %      Rating of Perceived Exertion (Exercise)  13      Perceived Dyspnea (Exercise)  2      Comments     Reviewed individualized exercise prescription and made increases per departmental policy. Exercise increases were discussed with the patient and they were able to perform the new work loads without issue (no signs or  symptoms).  Cayleigh has also been increasing in weight lifted on the leg press almost every session. Completed the mid program 6MW test and did very well.     Duration Progress to 50 minutes of aerobic without signs/symptoms of physical distress Progress to 50 minutes of aerobic without signs/symptoms of physical distress Progress to 50 minutes of aerobic without signs/symptoms of physical distress Progress to 50 minutes of aerobic without signs/symptoms of physical distress Progress to 50 minutes of aerobic without signs/symptoms of physical distress   Intensity _10    Progression Continue progressive overload as per policy without signs/symptoms or physical distress. Continue progressive overload as per policy without signs/symptoms or physical distress. Continue progressive overload as per policy without signs/symptoms or physical distress. Continue progressive overload as per policy without signs/symptoms or physical distress. Continue progressive overload as  per policy without signs/symptoms or physical distress.   Resistance Training   Training Prescription _0    Weight _1 Reps 10-12 10-12 10-12 10-12 10-12   Treadmill   MPH 1._2 Grade 0 0 0 1 1   Minutes _3 NuStep   Level _4 Watts 30 45 45 45 45   Minutes _5 Recumbant Elliptical   Level 2  BioStep 3  BioStep 4  BioStep 4  BioStep 4  BioStep   RPM 30 30 35 35 35   Watts _6 Minutes    15 15     11/22/14 1200 11/26/14 1400 11/29/14 1100       Exercise Review   Progression Yes  Yes     Response to Exercise   Blood Pressure (Admit) 120/70 mmHg       Blood Pressure (Exercise) 134/72 mmHg       Blood Pressure (Exit) 100/68 mmHg       Heart Rate (Admit) 84 bpm       Heart Rate (Exercise) 118 bpm       Heart Rate (Exit) 85 bpm       Oxygen Saturation (Admit) 97 %       Oxygen  Saturation (Exercise) 94 %       Oxygen Saturation (Exit) 96 %       Rating of Perceived Exertion (Exercise) 13       Perceived Dyspnea (Exercise) 3       Comments Reviewed individualized exercise prescription and made increases per departmental policy. Exercise increases were discussed with the patient and they were able to perform the new work loads without issue (no signs or symptoms).  Kearra has also been increasing in weight lifted on the leg press almost every session. Completed the mid program 6MW test and did very well.         Duration Progress to 50 minutes of aerobic without signs/symptoms of physical distress       Intensity THRR unchanged       Progression Continue progressive overload as per policy without signs/symptoms or physical distress.       Resistance Training   Training Prescription Yes       Weight 2       Reps 10-12       Treadmill   MPH 2       Grade 1       Minutes 15       NuStep   Level 4       Watts 50       Minutes 15       Recumbant Elliptical   Level 4  BioStep -- 5  Biostep     RPM 35       Watts 15  45     Minutes 15          Discharge Exercise Prescription (Final Exercise Prescription Changes):     Exercise Prescription Changes - 11/29/14 1100    Exercise Review   Progression Yes   Recumbant Elliptical   Level 5  Biostep   Watts 45       Nutrition:  Target Goals: Understanding of nutrition guidelines, daily intake of sodium <1526m, cholesterol <2066m calories 30% from fat and 7% or less from  saturated fats, daily to have 5 or more servings of fruits and vegetables.  Biometrics:     Pre Biometrics - 10/07/14 0856    Pre Biometrics   Height _0  (1.702 m)   Weight 144 lb 9.6 oz (65.59 kg)   Waist Circumference 32 inches   Hip Circumference 38 inches   Waist to Hip Ratio 0.84 %   BMI (Calculated) 22.7       Nutrition Therapy Plan and Nutrition Goals:     Nutrition Therapy & Goals - 10/05/14 1120    Nutrition  Therapy   Diet Prefers not to meet with the dietitian; She eats fruits and vegetables mainly      Nutrition Discharge: Rate Your Plate Scores:   Psychosocial: Target Goals: Acknowledge presence or absence of depression, maximize coping skills, provide positive support system. Participant is able to verbalize types and ability to use techniques and skills needed for reducing stress and depression.  Initial Review & Psychosocial Screening:     Initial Psych Review & Screening - 10/05/14 1120    Family Dynamics   Good Support System? Yes  Ms Cumpian has good support from her son and friends.   Barriers   Psychosocial barriers to participate in program There are no identifiable barriers or psychosocial needs.;The patient should benefit from training in stress management and relaxation.   Screening Interventions   Interventions Encouraged to exercise      Quality of Life Scores:   PHQ-9:     Recent Review Flowsheet Data    Depression screen [facility] 2/9 10/05/2014 09/02/2014 04/23/2013   Decreased Interest 0 0 0   Down, Depressed, Hopeless 0 0 0   PHQ - 2 Score 0 0 0      Psychosocial Evaluation and Intervention:     Psychosocial Evaluation - 10/27/14 1246    Psychosocial Evaluation & Interventions   Interventions Stress management education;Relaxation education;Encouraged to exercise with the program and follow exercise prescription   Comments Counselor met with Ms. Winning today for initial psychosocial evaluation.  She is a 79 year old who has been diagnosed with Emphysema about a year ago.  She lives alone but reports a strong support system living in a retirement community and actively involved in her local church.  She also has sleep apnea and uses a CPAP.  She denies a history of depression or current symptoms.  She admits her mood is typically positive/optimistic but she does have a daughter with some mental health issues and Ms. D's own health issues that cause some stress on  occasion.  She has goals to improve her overall health, increase her stamina and strength and to learn to exercise more consistently.     Continued Psychosocial Services Needed Yes  Due to Ms. Philipps's current stressors, she will benefit from the psychoeducational components of this program, particularly stress management and depression.       Psychosocial Re-Evaluation:  Education: Education Goals: Education classes will be provided on a weekly basis, covering required topics. Participant will state understanding/return demonstration of topics presented.  Learning Barriers/Preferences:     Learning Barriers/Preferences - 10/05/14 1130    Learning Barriers/Preferences   Learning Barriers None   Learning Preferences Group Instruction;Individual Instruction;Pictoral;Skilled Demonstration;Verbal Instruction;Video;Written Material      Education Topics: Initial Evaluation Education: - Verbal, written and demonstration of respiratory meds, RPE/PD scales, oximetry and breathing techniques. Instruction on use of nebulizers and MDIs: cleaning and proper use, rinsing mouth with steroid doses and importance of  monitoring MDI activations.          Pulmonary Rehab from 11/29/2014 in [place]   Date  10/05/14   Educator  LB   Instruction Review Code  2- meets goals/outcomes      General Nutrition Guidelines/Fats and Fiber: -Group instruction provided by verbal, written material, models and posters to present the general guidelines for heart healthy nutrition. Gives an explanation and review of dietary fats and fiber.      Pulmonary Rehab from 11/29/2014 in [place]   Date  11/29/14   Educator  P. [place]   Instruction Review Code  2- meets goals/outcomes      Controlling Sodium/Reading Food Labels: -Group verbal and written material supporting the discussion of sodium use in heart healthy nutrition. Review  and explanation with models, verbal and written materials for utilization of the food label.      Pulmonary Rehab from 11/29/2014 in [place]   Date  10/11/14   Educator  C. Joneen Caraway, RD   Instruction Review Code  2- meets goals/outcomes      Exercise Physiology & Risk Factors: - Group verbal and written instruction with models to review the exercise physiology of the cardiovascular system and associated critical values. Details cardiovascular disease risk factors and the goals associated with each risk factor.      Pulmonary Rehab from 11/29/2014 in [place]   Date  10/27/14   Educator  SW   Instruction Review Code  2- meets goals/outcomes      Aerobic Exercise & Resistance Training: - Gives group verbal and written discussion on the health impact of inactivity. On the components of aerobic and resistive training programs and the benefits of this training and how to safely progress through these programs.   Flexibility, Balance, General Exercise Guidelines: - Provides group verbal and written instruction on the benefits of flexibility and balance training programs. Provides general exercise guidelines with specific guidelines to those with heart or lung disease. Demonstration and skill practice provided.   Stress Management: - Provides group verbal and written instruction about the health risks of elevated stress, cause of high stress, and healthy ways to reduce stress.   Depression: - Provides group verbal and written instruction on the correlation between heart/lung disease and depressed mood, treatment options, and the stigmas associated with seeking treatment.   Exercise & Equipment Safety: - Individual verbal instruction and demonstration of equipment use and safety with use of the equipment.      Pulmonary Rehab from 11/29/2014 in [place]   Date  11/22/14    Educator  C. Deann Mclaine,RN   Instruction Review Code  2- meets goals/outcomes      Infection Prevention: - Provides verbal and written material to individual with discussion of infection control including proper hand washing and proper equipment cleaning during exercise session.      Pulmonary Rehab from 11/29/2014 in [place]   Date  10/11/14   Educator  LB   Instruction Review Code  2- meets goals/outcomes      Falls Prevention: - Provides verbal and written material to individual with discussion of falls prevention and safety.      Pulmonary Rehab from 11/29/2014 in [place]   Date  10/05/14   Educator  LB   Instruction Review Code  2- meets goals/outcomes  Diabetes: - Individual verbal and written instruction to review signs/symptoms of diabetes, desired ranges of glucose level fasting, after meals and with exercise. Advice that pre and post exercise glucose checks will be done for 3 sessions at entry of program.      Pulmonary Rehab from 11/29/2014 in [place]   Date  10/22/14   Educator  CE   Instruction Review Code  2- meets goals/outcomes      Chronic Lung Diseases: - Group verbal and written instruction to review new updates, new respiratory medications, new advancements in procedures and treatments. Provide informative websites and "800" numbers of self-education.   Lung Procedures: - Group verbal and written instruction to describe testing methods done to diagnose lung disease. Review the outcome of test results. Describe the treatment choices: Pulmonary Function Tests, ABGs and oximetry.   Energy Conservation: - Provide group verbal and written instruction for methods to conserve energy, plan and organize activities. Instruct on pacing techniques, use of adaptive equipment and posture/positioning to relieve shortness of  breath.   Triggers: - Group verbal and written instruction to review types of environmental controls: home humidity, furnaces, filters, dust mite/pet prevention, HEPA vacuums. To discuss weather changes, air quality and the benefits of nasal washing.   Exacerbations: - Group verbal and written instruction to provide: warning signs, infection symptoms, calling MD promptly, preventive modes, and value of vaccinations. Review: effective airway clearance, coughing and/or vibration techniques. Create an Sports administrator.   Oxygen: - Individual and group verbal and written instruction on oxygen therapy. Includes supplement oxygen, available portable oxygen systems, continuous and intermittent flow rates, oxygen safety, concentrators, and Medicare reimbursement for oxygen.   Respiratory Medications: - Group verbal and written instruction to review medications for lung disease. Drug class, frequency, complications, importance of spacers, rinsing mouth after steroid MDI's, and proper cleaning methods for nebulizers.      Pulmonary Rehab from 11/29/2014 in [place]   Date  10/05/14   Educator  LB   Instruction Review Code  2- meets goals/outcomes      AED/CPR: - Group verbal and written instruction with the use of models to demonstrate the basic use of the AED with the basic ABC's of resuscitation.   Breathing Retraining: - Provides individuals verbal and written instruction on purpose, frequency, and proper technique of diaphragmatic breathing and pursed-lipped breathing. Applies individual practice skills.      Pulmonary Rehab from 11/29/2014 in [place]   Date  10/05/14   Educator  LB   Instruction Review Code  2- meets goals/outcomes      Anatomy and Physiology of the Lungs: - Group verbal and written instruction with the use of models to provide basic lung anatomy and physiology related to function, structure  and complications of lung disease.      Pulmonary Rehab from 11/29/2014 in [place]   Date  11/05/14   Educator  [place]   Instruction Review Code  2- meets goals/outcomes      Heart Failure: - Group verbal and written instruction on the basics of heart failure: signs/symptoms, treatments, explanation of ejection fraction, enlarged heart and cardiomyopathy.   Sleep Apnea: - Individual verbal and written instruction to review Obstructive Sleep Apnea. Review of risk factors, methods for diagnosing and types of masks and machines for OSA.   Anxiety: - Provides group, verbal and written instruction on the correlation between heart/lung disease and  anxiety, treatment options, and management of anxiety.      Pulmonary Rehab from 11/29/2014 in [place]   Date  10/13/14   Educator  [facility]   Instruction Review Code  2- Meets goals/outcomes      Relaxation: - Provides group, verbal and written instruction about the benefits of relaxation for patients with heart/lung disease. Also provides patients with examples of relaxation techniques.   Knowledge Questionnaire Score:     Knowledge Questionnaire Score - 10/05/14 1120    Knowledge Questionnaire Score   Pre Score -2      Personal Goals and Risk Factors at Admission:     Personal Goals and Risk Factors at Admission - 10/05/14 1120    Personal Goals and Risk Factors on Admission    Weight Management No   Increase Aerobic Exercise and Physical Activity Yes   Intervention While in program, learn and follow the exercise prescription taught. Start at a low level workload and increase workload after able to maintain previous level for 30 minutes. Increase time before increasing intensity.  Ms Farrington lives at [facility] and can use their gym .   Understand more about Heart/Pulmonary Disease. Yes   Intervention While in program utilize professionals for any questions,  and attend the education sessions. Great websites to use are www.americanheart.org or www.lung.org for reliable information.  Ms Granlund would be interested in the education on COPD and other education for daily living management.   Improve shortness of breath with ADL's Yes   Intervention While in program, learn and follow the exercise prescription taught. Start at a low level workload and increase workload ad advised by the exercise physiologist. Increase time before increasing intensity.   Develop more efficient breathing techniques such as purse lipped breathing and diaphragmatic breathing; and practicing self-pacing with activity Yes   Intervention While in program, learn and utilize the specific breathing techniques taught to you. Continue to practice and use the techniques as needed.   Increase knowledge of respiratory medications and ability to use respiratory devices properly.  Yes   Intervention While in program, learn to administer MDI, nebulizer, and spacer properly.;Learn to take respiratory medicine as ordered.;While in program, learn to Clean MDI, nebulizers, and spacers properly.  Ms Callicott uses Spiriva, Ventolin MDI and SVN, Symbicort, and a spacer.  she is also on CPAP - full face mask; [employer].   Lipids Yes      Personal Goals and Risk Factors Review:      Goals and Risk Factor Review      10/15/14 1400 10/20/14 1231 10/29/14 1345 11/03/14 1130 11/05/14 1542   Increase Aerobic Exercise and Physical Activity   Goals Progress/Improvement seen  Yes   Yes    Comments Darrielle is very happy with her progress so far and has already made several increases on the equipment. Her time on the equipment has been lengthened and she can almost exercise for the enitre class already. She is very eager to continue to make improvements.    Ms Clavel increased her leg press weight today to 55lbs. She is so motivaed to work hard at her exercise goals.    Understand more about Heart/Pulmonary  Disease   Goals Progress/Improvement seen   Yes   Yes   Comments  She has only had two education classes so far (diet and relaxation), but she enjoyed these sessions and both had good reminders of topics she already knew.    Education on [employer]  was given today.  Ms. Nugent was also provided with a few websites to vist to provide more education on heart and lungs.   Improve shortness of breath with ADL's   Goals Progress/Improvement seen   No      Comments  She has not noticed a change in her SOB, the exercise equipment is still challenging for her, but she is making increases and exercising for longer periods. She has not noticed any change with activities she does at home. The heat may be a contributor to her still feeling short of breath with outdoor and household tasks.       Increase knowledge of respiratory medications   Goals Progress/Improvement seen    Yes     Comments   Ms. Widger was given a spacer to use with her MDI at home.  She was instructed on how to use it and she was impressed with the whistle feature on this device.       11/15/14 1130 11/23/14 0858         Increase Aerobic Exercise and Physical Activity   Goals Progress/Improvement seen  Yes Yes      Comments Ms Maclaren has increased her leg press weight to 60lbs.  She has had several increases in her exercise goals, including a 1% grade on the TM. Ms Dejonge increased her leg press weight to 70lbs times 20 reps. She also increased her mid 68md by 3072f - Minimal Importance Difference for COPD is 98.74f41f     Improve shortness of breath with ADL's   Comments  Ms DudHollingsheads noticed an improvement in her walking distance with less shortness of breath.      Breathing Techniques   Goals Progress/Improvement seen   Yes      Comments  Ms DudRomanoes PLB with her walking from the parking lot and walking her dog; plus with her exercise in [facility] - very helpful breathing technique.         Personal Goals Discharge (Final  Personal Goals and Risk Factors Review):      Goals and Risk Factor Review - 11/23/14 0858    Increase Aerobic Exercise and Physical Activity   Goals Progress/Improvement seen  Yes   Comments Ms DudBiancardicreased her leg press weight to 70lbs times 20 reps. She also increased her mid 6mw5my 300ft2fMinimal Importance Difference for COPD is 98.74ft. 6fmprove shortness of breath with ADL's   Comments Ms DudleyTemplinoticed an improvement in her walking distance with less shortness of breath.   Breathing Techniques   Goals Progress/Improvement seen  Yes   Comments Ms DudleyHainlinePLB with her walking from the parking lot and walking her dog; plus with her exercise in [facility] - very helpful breathing technique.      Comments: 30 day note

## 2014-12-01 ENCOUNTER — Encounter: Payer: Medicare Other | Admitting: *Deleted

## 2014-12-01 DIAGNOSIS — J449 Chronic obstructive pulmonary disease, unspecified: Secondary | ICD-10-CM | POA: Diagnosis not present

## 2014-12-01 NOTE — Progress Notes (Signed)
Daily Session Note  Patient Details  Name: Dorothy Church MRN: [id number] Date of Birth: 1935/03/10 Referring Provider:  Vilinda Boehringer, MD  Encounter Date: 12/01/2014  Check In:     Session Check In - 12/01/14 1214    Check-In   Staff Present Laureen Owens Shark BS, RRT, Respiratory Therapist;Kellen Dutch Dillard Essex MS, ACSM CEP Exercise Physiologist;Steven Way BS, ACSM EP-C, Exercise Physiologist   ER physicians immediately available to respond to emergencies [facility] immediately available ER MD   Physician(s) Burlene Arnt and Joni Fears   Medication changes reported     No   Fall or balance concerns reported    No   Warm-up and Cool-down Performed on first and last piece of equipment   VAD Patient? No   Pain Assessment   Currently in Pain? No/denies   Multiple Pain Sites No         Goals Met:  Proper associated with RPD/PD & O2 Sat Independence with exercise equipment Using PLB without cueing & demonstrates good technique Exercise tolerated well Strength training completed today  Goals Unmet:  Not Applicable  Goals Comments: Completed all exercise activity today. Forgot to make increase on BioStep, but will do it on Friday   Dr. Emily Filbert is Medical Director for [place] and [facility].

## 2014-12-03 ENCOUNTER — Encounter: Payer: Medicare Other | Admitting: *Deleted

## 2014-12-03 DIAGNOSIS — J449 Chronic obstructive pulmonary disease, unspecified: Secondary | ICD-10-CM

## 2014-12-03 NOTE — Progress Notes (Signed)
Daily Session Note  Patient Details  Name: Dorothy Church MRN: [id number] Date of Birth: 25-May-1935 Referring Provider:  Lucille Passy, MD  Encounter Date: 12/03/2014  Check In:     Session Check In - 12/03/14 1145    Check-In   Staff Present Frederich Cha RRT, RCP Respiratory Therapist;Carroll Enterkin RN, Drusilla Kanner MS, ACSM CEP Exercise Physiologist   ER physicians immediately available to respond to emergencies [facility] immediately available ER MD   Physician(s) Jacqualine Code and Paduchowski   Medication changes reported     No   Fall or balance concerns reported    No   Warm-up and Cool-down Performed on first and last piece of equipment   VAD Patient? No   Pain Assessment   Currently in Pain? No/denies   Multiple Pain Sites No           Exercise Prescription Changes - 12/03/14 1100    Exercise Review   Progression Yes   Response to Exercise   Comments Reviewed individualized exercise prescription and made increases per departmental policy. Exercise increases were discussed with the patient and they were able to perform the new work loads without issue (no signs or symptoms).     Duration Progress to 50 minutes of aerobic without signs/symptoms of physical distress   Intensity THRR unchanged   Progression Continue progressive overload as per policy without signs/symptoms or physical distress.   Resistance Training   Training Prescription Yes   Weight 2   Reps 10-12   Treadmill   MPH 2   Grade 1   Minutes 15   NuStep   Level 5   Watts 60   Minutes 15   Recumbant Elliptical   Level 5  BioStep   RPM 45   Watts 15   Minutes 15      Goals Met:  Proper associated with RPD/PD & O2 Sat Independence with exercise equipment Using PLB without cueing & demonstrates good technique Exercise tolerated well Personal goals reviewed Strength training completed today  Goals Unmet:  Not Applicable  Goals Comments: Increases made, all exercise activity and goals  completed. 2.58m of Albuterol was given for SOB.  Ms. DLollie Marrowstates that her forgot to take her inhaler today.   [name] Medical Director for [facility].

## 2014-12-06 ENCOUNTER — Encounter: Payer: Medicare Other | Admitting: *Deleted

## 2014-12-06 DIAGNOSIS — J449 Chronic obstructive pulmonary disease, unspecified: Secondary | ICD-10-CM

## 2014-12-06 NOTE — Progress Notes (Signed)
Daily Session Note  Patient Details  Name: Dorothy Church MRN: [id number] Date of Birth: [birth year] Referring Provider:  Lucille Passy, MD  Encounter Date: 12/06/2014  Check In:     Session Check In - 12/06/14 1201    Check-In   Staff Present Carson Myrtle BS, RRT, Respiratory Therapist;Breyon Sigg RN, BSN;Steven Way BS, ACSM EP-C, Exercise Physiologist   ER physicians immediately available to respond to emergencies [facility] immediately available ER MD   Physician(s) Dr/ Marcelene [place]te Dr. Annabell Sabal   Medication changes reported     No   Fall or balance concerns reported    No   Warm-up and Cool-down Performed on first and last piece of equipment   VAD Patient? No   Pain Assessment   Multiple Pain Sites No         Goals Met:  Proper associated with RPD/PD & O2 Sat Exercise tolerated well  Goals Unmet:  Not Applicable  Goals Comments: Progressing with exercise goals.   Dr. Emily Filbert is Medical Director for [place] and [facility].

## 2014-12-08 ENCOUNTER — Encounter: Payer: Medicare Other | Admitting: *Deleted

## 2014-12-08 DIAGNOSIS — J449 Chronic obstructive pulmonary disease, unspecified: Secondary | ICD-10-CM | POA: Diagnosis not present

## 2014-12-08 NOTE — Progress Notes (Signed)
Daily Session Note  Patient Details  Name: Dorothy Church MRN: [id number] Date of Birth: [birth year] Referring Provider:  Vilinda Boehringer, MD  Encounter Date: 12/08/2014  Check In:     Session Check In - 12/08/14 1226    Check-In   Staff Present Candiss Norse MS, ACSM CEP Exercise Physiologist;Laureen Janell Quiet, RRT, Respiratory Therapist;Steven Way BS, ACSM EP-C, Exercise Physiologist   ER physicians immediately available to respond to emergencies [facility] immediately available ER MD   Physician(s) Thomasene Lot and Lord   Medication changes reported     No   Fall or balance concerns reported    No   Warm-up and Cool-down Performed on first and last piece of equipment   VAD Patient? No   Pain Assessment   Currently in Pain? No/denies   Multiple Pain Sites No           Exercise Prescription Changes - 12/08/14 1200    Exercise Review   Progression Yes   Response to Exercise   Comments Increased Shamiah's speed to 2.1 on the treadmill and she was able to complete that with no issue.   Duration Progress to 50 minutes of aerobic without signs/symptoms of physical distress   Intensity THRR unchanged   Progression Continue progressive overload as per policy without signs/symptoms or physical distress.   Resistance Training   Training Prescription Yes   Weight 2   Reps 10-12   Treadmill   MPH 2.1   Grade 1   Minutes 15   NuStep   Level 5   Watts 60   Minutes 15   Recumbant Elliptical   Level 5  BioStep   RPM 45   Watts 15   Minutes 15      Goals Met:  Proper associated with RPD/PD & O2 Sat Independence with exercise equipment Using PLB without cueing & demonstrates good technique Exercise tolerated well Personal goals reviewed Strength training completed today  Goals Unmet:  Not Applicable  Goals Comments: Reviewed individualized exercise prescription and made increases per departmental policy. Exercise increases were discussed with the patient and they were  able to perform the new work loads without issue (no signs or symptoms).     Dr. Emily Filbert is Medical Director for [place] and [facility].

## 2014-12-10 ENCOUNTER — Encounter: Payer: Medicare Other | Admitting: *Deleted

## 2014-12-10 DIAGNOSIS — J449 Chronic obstructive pulmonary disease, unspecified: Secondary | ICD-10-CM

## 2014-12-10 NOTE — Progress Notes (Signed)
Daily Session Note  Patient Details  Name: Takima Encina MRN: [id number] Date of Birth: 1935-05-17 Referring Provider:  Vilinda Boehringer, MD  Encounter Date: 12/10/2014  Check In:     Session Check In - 12/10/14 1152    Check-In   Staff Present Frederich Cha RRT, RCP Respiratory Therapist;Tamicka Shimon Dillard Essex MS, ACSM CEP Exercise Physiologist;Carroll Enterkin RN, BSN   ER physicians immediately available to respond to emergencies [facility] immediately available ER MD   Physician(s) Jacqualine Code and Lord   Medication changes reported     No   Fall or balance concerns reported    No   Warm-up and Cool-down Performed on first and last piece of equipment   VAD Patient? No   Pain Assessment   Currently in Pain? No/denies   Multiple Pain Sites No         Goals Met:  Proper associated with RPD/PD & O2 Sat Independence with exercise equipment Using PLB without cueing & demonstrates good technique Exercise tolerated well Strength training completed today  Goals Unmet:  Not Applicable  Goals Comments: Patient completed exercise prescription and all exercise goals during rehab session. The exercise was tolerated well and the patient is progressing in the program.    Dr. Emily Filbert is Medical Director for [place] and [facility].

## 2014-12-13 ENCOUNTER — Encounter: Payer: Self-pay | Admitting: *Deleted

## 2014-12-14 ENCOUNTER — Encounter: Payer: Self-pay | Admitting: *Deleted

## 2014-12-14 ENCOUNTER — Ambulatory Visit
Admission: RE | Admit: 2014-12-14 | Discharge: 2014-12-14 | Disposition: A | Discharge status: Home / Self Care | Payer: Medicare Other | Source: Ambulatory Visit | Attending: Gastroenterology | Admitting: Gastroenterology

## 2014-12-14 ENCOUNTER — Ambulatory Visit: Payer: Medicare Other | Admitting: Anesthesiology

## 2014-12-14 ENCOUNTER — Encounter: Payer: Self-pay | Admitting: Respiratory Therapy

## 2014-12-14 ENCOUNTER — Encounter
Admission: RE | Disposition: A | Discharge status: Home / Self Care | Payer: Self-pay | Source: Ambulatory Visit | Attending: Gastroenterology

## 2014-12-14 DIAGNOSIS — M1990 Unspecified osteoarthritis, unspecified site: Secondary | ICD-10-CM | POA: Diagnosis not present

## 2014-12-14 DIAGNOSIS — M810 Age-related osteoporosis without current pathological fracture: Secondary | ICD-10-CM | POA: Diagnosis not present

## 2014-12-14 DIAGNOSIS — Z7982 Long term (current) use of aspirin: Secondary | ICD-10-CM | POA: Insufficient documentation

## 2014-12-14 DIAGNOSIS — R1013 Epigastric pain: Secondary | ICD-10-CM | POA: Insufficient documentation

## 2014-12-14 DIAGNOSIS — Z79899 Other long term (current) drug therapy: Secondary | ICD-10-CM | POA: Insufficient documentation

## 2014-12-14 DIAGNOSIS — K5790 Diverticulosis of intestine, part unspecified, without perforation or abscess without bleeding: Secondary | ICD-10-CM | POA: Diagnosis not present

## 2014-12-14 DIAGNOSIS — D122 Benign neoplasm of ascending colon: Secondary | ICD-10-CM | POA: Diagnosis not present

## 2014-12-14 DIAGNOSIS — E559 Vitamin D deficiency, unspecified: Secondary | ICD-10-CM | POA: Diagnosis not present

## 2014-12-14 DIAGNOSIS — G4733 Obstructive sleep apnea (adult) (pediatric): Secondary | ICD-10-CM | POA: Insufficient documentation

## 2014-12-14 DIAGNOSIS — K449 Diaphragmatic hernia without obstruction or gangrene: Secondary | ICD-10-CM | POA: Diagnosis not present

## 2014-12-14 DIAGNOSIS — D120 Benign neoplasm of cecum: Secondary | ICD-10-CM | POA: Diagnosis not present

## 2014-12-14 DIAGNOSIS — E039 Hypothyroidism, unspecified: Secondary | ICD-10-CM | POA: Diagnosis not present

## 2014-12-14 DIAGNOSIS — K5730 Diverticulosis of large intestine without perforation or abscess without bleeding: Secondary | ICD-10-CM | POA: Diagnosis not present

## 2014-12-14 DIAGNOSIS — K210 Gastro-esophageal reflux disease with esophagitis: Secondary | ICD-10-CM | POA: Diagnosis not present

## 2014-12-14 DIAGNOSIS — K2970 Gastritis, unspecified, without bleeding: Secondary | ICD-10-CM | POA: Diagnosis not present

## 2014-12-14 DIAGNOSIS — K2270 Barrett's esophagus without dysplasia: Secondary | ICD-10-CM | POA: Insufficient documentation

## 2014-12-14 DIAGNOSIS — K317 Polyp of stomach and duodenum: Secondary | ICD-10-CM | POA: Diagnosis not present

## 2014-12-14 DIAGNOSIS — D123 Benign neoplasm of transverse colon: Secondary | ICD-10-CM | POA: Insufficient documentation

## 2014-12-14 DIAGNOSIS — J45909 Unspecified asthma, uncomplicated: Secondary | ICD-10-CM | POA: Insufficient documentation

## 2014-12-14 DIAGNOSIS — Z86010 Personal history of colonic polyps: Secondary | ICD-10-CM | POA: Diagnosis not present

## 2014-12-14 DIAGNOSIS — E7800 Pure hypercholesterolemia, unspecified: Secondary | ICD-10-CM | POA: Diagnosis not present

## 2014-12-14 DIAGNOSIS — J449 Chronic obstructive pulmonary disease, unspecified: Secondary | ICD-10-CM | POA: Diagnosis not present

## 2014-12-14 DIAGNOSIS — K3189 Other diseases of stomach and duodenum: Secondary | ICD-10-CM | POA: Diagnosis not present

## 2014-12-14 DIAGNOSIS — D131 Benign neoplasm of stomach: Secondary | ICD-10-CM | POA: Diagnosis not present

## 2014-12-14 DIAGNOSIS — K635 Polyp of colon: Secondary | ICD-10-CM | POA: Diagnosis not present

## 2014-12-14 HISTORY — DX: Pure hypercholesterolemia, unspecified: E78.00

## 2014-12-14 HISTORY — DX: Unspecified osteoarthritis, unspecified site: M19.90

## 2014-12-14 HISTORY — PX: COLONOSCOPY WITH PROPOFOL: SHX5780

## 2014-12-14 HISTORY — PX: ESOPHAGOGASTRODUODENOSCOPY (EGD) WITH PROPOFOL: SHX5813

## 2014-12-14 SURGERY — COLONOSCOPY WITH PROPOFOL
Anesthesia: General

## 2014-12-14 MED ORDER — IPRATROPIUM-ALBUTEROL 0.5-2.5 (3) MG/3ML IN SOLN
RESPIRATORY_TRACT | Status: AC
  Administered 2014-12-14: 3 mL via RESPIRATORY_TRACT
  Filled 2014-12-14: qty 3

## 2014-12-14 MED ORDER — SODIUM CHLORIDE 0.9 % IV SOLN
INTRAVENOUS | Status: DC
  Administered 2014-12-14: 10:00:00 via INTRAVENOUS

## 2014-12-14 MED ORDER — FENTANYL CITRATE (PF) 100 MCG/2ML IJ SOLN
INTRAMUSCULAR | Status: DC | PRN
  Administered 2014-12-14: 50 ug via INTRAVENOUS

## 2014-12-14 MED ORDER — PROPOFOL 10 MG/ML IV BOLUS
INTRAVENOUS | Status: DC | PRN
  Administered 2014-12-14: 10 mg via INTRAVENOUS
  Administered 2014-12-14: 40 mg via INTRAVENOUS

## 2014-12-14 MED ORDER — MIDAZOLAM HCL 2 MG/2ML IJ SOLN
INTRAMUSCULAR | Status: DC | PRN
  Administered 2014-12-14: 1 mg via INTRAVENOUS

## 2014-12-14 MED ORDER — EPHEDRINE SULFATE 50 MG/ML IJ SOLN
INTRAMUSCULAR | Status: DC | PRN
  Administered 2014-12-14: 5 mg via INTRAVENOUS

## 2014-12-14 MED ORDER — IPRATROPIUM-ALBUTEROL 0.5-2.5 (3) MG/3ML IN SOLN
3.0000 mL | Freq: Once | RESPIRATORY_TRACT | Status: AC
Start: 2014-12-14 — End: 2014-12-14
  Administered 2014-12-14: 3 mL via RESPIRATORY_TRACT

## 2014-12-14 MED ORDER — LIDOCAINE HCL (CARDIAC) 20 MG/ML IV SOLN
INTRAVENOUS | Status: DC | PRN
  Administered 2014-12-14: 20 mg via INTRAVENOUS

## 2014-12-14 MED ORDER — PROPOFOL 500 MG/50ML IV EMUL
INTRAVENOUS | Status: DC | PRN
  Administered 2014-12-14: 100 ug/kg/min via INTRAVENOUS

## 2014-12-14 NOTE — H&P (Signed)
Outpatient short stay form Pre-procedure 12/14/2014 12:45 PM Dorothy Sails MD  Primary Physician: Dr. Arnette Norris  Reason for visit:  EGD and colonoscopy  History of present illness:  Patient is a 79 year old female presenting today for follow-up in regards to her personal history of adenomatous colon polyps and Barrett's esophagus. She is also been having some amount of some dyspepsia. On her last EGD in 2013 there was no Barrett's in evidence on biopsies. She takes Nexium once a day.  She tolerated her prep well. She has held her 81 mg aspirin takes no other anticoagulation type medications or aspirin products.    Current facility-administered medications:  .  0.9 %  sodium chloride infusion, , Intravenous, Continuous, Dorothy Sails, MD, Last Rate: 20 mL/hr at 12/14/14 1026  Prescriptions prior to admission  Medication Sig Dispense Refill Last Dose  . budesonide-formoterol (SYMBICORT) 160-4.5 MCG/ACT inhaler Inhale 2 puffs into the lungs daily as needed. 1 Inhaler 1 12/14/2014 at Unknown time  . albuterol (VENTOLIN HFA) 108 (90 BASE) MCG/ACT inhaler Inhale 2 puffs into the lungs every 6 (six) hours as needed for wheezing. 18 g 5 Taking  . aspirin 81 MG tablet Take 81 mg by mouth daily.   Taking  . calcium carbonate (OS-CAL) 600 MG TABS Take 600 mg by mouth daily.   Taking  . denosumab (PROLIA) 60 MG/ML SOLN injection Inject 60 mg into the skin every 6 (six) months. Administer in upper arm, thigh, or abdomen   Taking  . esomeprazole (NEXIUM) 40 MG capsule Take 40 mg by mouth daily before breakfast.   Taking  . ipratropium-albuterol (DUONEB) 0.5-2.5 (3) MG/3ML SOLN Take 3 mLs by nebulization every 8 (eight) hours as needed. 1 ampule 3 times a day for 3 days. 360 mL 3 Taking  . levothyroxine (SYNTHROID, LEVOTHROID) 50 MCG tablet Take 1 tablet (50 mcg  total) by mouth daily  before breakfast. 30 tablet 3 Taking  . Multiple Vitamin (MULTIVITAMIN) tablet Take 1 tablet by mouth daily.    Taking  . PARoxetine (PAXIL) 30 MG tablet Take 1 tablet by mouth  every morning 90 tablet 1 Taking  . simvastatin (ZOCOR) 40 MG tablet Take 1 tablet (40 mg total) by mouth daily at 6 PM. NEED TO MAKE APPOINTMENT FOR ANNUAL PHYSICAL 90 tablet 0 Taking  . Tiotropium Bromide Monohydrate (SPIRIVA RESPIMAT) 2.5 MCG/ACT AERS Inhale 2 puffs into the lungs daily. Gargle and rinse after each use. 3 Inhaler 2 Taking     Allergies  Allergen Reactions  . Amoxicillin Other (See Comments)    Pencillins  . Cefzil [Cefprozil]   . Cephalexin   . Propoxyphene     Other reaction(s): Unknown  . Relafen [Nabumetone]   . Talwin [Pentazocine]      Past Medical History  Diagnosis Date  . Intrinsic asthma, unspecified   . Aortic valve disorders   . Obstructive sleep apnea (adult) (pediatric)   . Vitamin D deficiency   . Barrett's esophagus   . Impaired fasting glucose   . COPD (chronic obstructive pulmonary disease) ([place])   . Syncope and collapse   . Hypothyroidism   . Osteoporosis   . Pure hypercholesterolemia   . Arthritis     Review of systems:      Physical Exam    Heart and lungs: Regular rate and rhythm without rub or gallop lungs are bilaterally clear    HEENT: Normocephalic atraumatic eyes are anicteric    Other:     Pertinant  exam for procedure: Soft nontender nondistended bowel sounds positive normoactive    Planned proceedures: EGD and colonoscopy with indicated procedures. I have discussed the risks benefits and complications of procedures to include not limited to bleeding, infection, perforation and the risk of sedation and the patient wishes to proceed.    Dorothy Sails, MD Gastroenterology 12/14/2014  12:45 PM

## 2014-12-14 NOTE — Anesthesia Preprocedure Evaluation (Signed)
Anesthesia Evaluation  Patient identified by MRN, date of birth, ID band Patient awake    Reviewed: Allergy & Precautions, H&P , NPO status , Patient's Chart, lab work & pertinent test results  History of Anesthesia Complications Negative for: history of anesthetic complications  Airway Mallampati: II  TM Distance: >3 FB Neck ROM: full    Dental  (+) Teeth Intact   Pulmonary neg shortness of breath, asthma , sleep apnea , COPD,    Pulmonary exam normal breath sounds clear to auscultation       Cardiovascular Exercise Tolerance: Good (-) angina(-) Past MI and (-) DOE negative cardio ROS Normal cardiovascular exam Rhythm:regular Rate:Normal     Neuro/Psych  Neuromuscular disease negative psych ROS   GI/Hepatic negative GI ROS, Neg liver ROS,   Endo/Other  Hypothyroidism   Renal/GU negative Renal ROS  negative genitourinary   Musculoskeletal negative musculoskeletal ROS (+) Arthritis ,   Abdominal   Peds negative pediatric ROS (+)  Hematology negative hematology ROS (+)   Anesthesia Other Findings Past Medical History:   Intrinsic asthma, unspecified                                Aortic valve disorders                                       Obstructive sleep apnea (adult) (pediatric)                  Vitamin D deficiency                                         Barrett's esophagus                                          Impaired fasting glucose                                     COPD (chronic obstructive pulmonary disease) (*              Syncope and collapse                                         Hypothyroidism                                               Osteoporosis                                                 Pure hypercholesterolemia  Arthritis                                                   Past Surgical History:   REPLACEMENT UNICONDYLAR JOINT KNEE                             HIP SURGERY                                                   COLONOSCOPY                                                   EYE SURGERY                                                   prosthesis eye implant                                        TONSILLECTOMY                                                BMI    Body Mass Index   23.41 kg/m 2      Reproductive/Obstetrics negative OB ROS                             Anesthesia Physical Anesthesia Plan  ASA: III  Anesthesia Plan: General   Post-op Pain Management:    Induction:   Airway Management Planned:   Additional Equipment:   Intra-op Plan:   Post-operative Plan:   Informed Consent: I have reviewed the patients History and Physical, chart, labs and discussed the procedure including the risks, benefits and alternatives for the proposed anesthesia with the patient or authorized representative who has indicated his/her understanding and acceptance.   Dental Advisory Given  Plan Discussed with: Anesthesiologist, CRNA and Surgeon  Anesthesia Plan Comments:         Anesthesia Quick Evaluation

## 2014-12-14 NOTE — Transfer of Care (Signed)
Immediate Anesthesia Transfer of Care Note  Patient: Dorothy Church  Procedure(s) Performed: Procedure(s): COLONOSCOPY WITH PROPOFOL (N/A) ESOPHAGOGASTRODUODENOSCOPY (EGD) WITH PROPOFOL (N/A)  Patient Location: PACU and Short Stay  Anesthesia Type:General  Level of Consciousness: sedated  Airway & Oxygen Therapy: Patient Spontanous Breathing and Patient connected to nasal cannula oxygen  Post-op Assessment: Report given to RN and Post -op Vital signs reviewed and stable  Post vital signs: Reviewed and stable  Last Vitals:  Filed Vitals:   12/14/14 1009  BP: 102/86  Pulse: 84  Temp: 36.2 C  Resp: 20    Complications: No apparent anesthesia complications

## 2014-12-14 NOTE — Anesthesia Postprocedure Evaluation (Signed)
  Anesthesia Post-op Note  Patient: Dorothy Church  Procedure(s) Performed: Procedure(s): COLONOSCOPY WITH PROPOFOL (N/A) ESOPHAGOGASTRODUODENOSCOPY (EGD) WITH PROPOFOL (N/A)  Anesthesia type:General  Patient location: PACU  Post pain: Pain level controlled  Post assessment: Post-op Vital signs reviewed, Patient's Cardiovascular Status Stable, Respiratory Function Stable, Patent Airway and No signs of Nausea or vomiting  Post vital signs: Reviewed and stable  Last Vitals:  Filed Vitals:   12/14/14 1440  BP: 112/80  Pulse: 68  Temp:   Resp: 16    Level of consciousness: awake, alert  and patient cooperative  Complications: No apparent anesthesia complications

## 2014-12-14 NOTE — Op Note (Signed)
[facility] Gastroenterology Patient Name: Dorothy Church Procedure Date: 12/14/2014 12:53 PM MRN: [id number] Account #: [id number] Date of Birth: 1935/03/12 Admit Type: Outpatient Age: 79 Room: [facility] ENDO ROOM 3 Gender: Female Note Status: Finalized Procedure:         Colonoscopy Indications:       Personal history of colonic polyps Providers:         Lollie Sails, MD Referring MD:      Marciano Sequin. Deborra Medina, MD (Referring MD) Medicines:         Monitored Anesthesia Care Complications:     No immediate complications. Procedure:         Pre-Anesthesia Assessment:                    - ASA Grade Assessment: III - A patient with severe                     systemic disease.                    After obtaining informed consent, the colonoscope was                     passed under direct vision. Throughout the procedure, the                     patient's blood pressure, pulse, and oxygen saturations                     were monitored continuously. The Colonoscope was                     introduced through the anus and advanced to the the cecum,                     identified by appendiceal orifice and ileocecal valve. The                     colonoscopy was unusually difficult due to multiple                     diverticula in the colon, poor bowel prep and a tortuous                     colon. Successful completion of the procedure was aided by                     changing the patient to a supine position, changing the                     patient to a prone position, using manual pressure and                     withdrawing and reinserting the scope. The patient                     tolerated the procedure well. The quality of the bowel                     preparation was fair. Findings:      Multiple small and large-mouthed diverticula were found in the entire       colon.      A 3 mm polyp was found in the cecum. The polyp was sessile. The polyp  was removed  with a cold biopsy forceps. Resection and retrieval were       complete.      A 8 mm polyp was found in the proximal ascending colon. The polyp was       sessile. The polyp was removed with a lift and cut technique using a hot       snare. The polyp was removed with a piecemeal technique using a hot       snare. The polyp was removed with a cold snare. Resection and retrieval       were complete.      A 2 mm polyp was found in the proximal ascending colon. The polyp was       sessile. The polyp was removed with a cold biopsy forceps. Resection and       retrieval were complete.      A 4 mm polyp was found at the hepatic flexure. The polyp was sessile.       The polyp was removed with a cold snare. Resection and retrieval were       complete.      Three sessile polyps were found in the transverse colon. The polyps were       2 to 4 mm in size. These polyps were removed with a cold biopsy forceps.       Resection and retrieval were complete, one removed with a cold snare. Impression:        - Diverticulosis in the entire examined colon.                    - One 3 mm polyp in the cecum. Resected and retrieved.                    - One 8 mm polyp in the proximal ascending colon. Resected                     and retrieved.                    - One 2 mm polyp in the proximal ascending colon. Resected                     and retrieved.                    - One 4 mm polyp at the hepatic flexure. Resected and                     retrieved.                    - Three 2 to 4 mm polyps in the transverse colon. Resected                     and retrieved. Recommendation:    - Await pathology results.                    - Telephone GI clinic for pathology results in 1 week. Procedure Code(s): --- Professional ---                    [phone], Colonoscopy, flexible; with removal of tumor(s),                     polyp(s), or other lesion(s) by snare technique  95747, 104, Colonoscopy,  flexible; with biopsy, single or                     multiple Diagnosis Code(s): --- Professional ---                    211.3, Benign neoplasm of colon                    V12.72, Personal history of colonic polyps                    562.10, Diverticulosis of colon (without mention of                     hemorrhage) CPT copyright 2014 American Medical Association. All rights reserved. The codes documented in this report are preliminary and upon coder review may  be revised to meet current compliance requirements. Lollie Sails, MD 12/14/2014 3:30:12 PM This report has been signed electronically. Number of Addenda: 0 Note Initiated On: 12/14/2014 12:53 PM Scope Withdrawal Time: 0 hours 21 minutes 39 seconds  Total Procedure Duration: 0 hours 41 minutes 55 seconds       [facility]

## 2014-12-14 NOTE — Op Note (Signed)
[facility] Gastroenterology Patient Name: Dorothy Church Procedure Date: 12/14/2014 12:52 PM MRN: [id number] Account #: [id number] Date of Birth: 01/14/1936 Admit Type: Outpatient Age: 79 Room: [facility] ENDO ROOM 3 Gender: Female Note Status: Finalized Procedure:         Upper GI endoscopy Indications:       Dyspepsia, Follow-up of Barrett's esophagus Providers:         Lollie Sails, MD Referring MD:      Marciano Sequin. Deborra Medina, MD (Referring MD) Medicines:         Monitored Anesthesia Care Complications:     No immediate complications. Procedure:         Pre-Anesthesia Assessment:                    - ASA Grade Assessment: III - A patient with severe                     systemic disease.                    After obtaining informed consent, the endoscope was passed                     under direct vision. Throughout the procedure, the                     patient's blood pressure, pulse, and oxygen saturations                     were monitored continuously. The Endoscope was introduced                     through the mouth, and advanced to the third part of                     duodenum. The upper GI endoscopy was accomplished without                     difficulty. The patient tolerated the procedure well. Findings:      The Z-line was variable. Biopsies were taken with a cold forceps for       histology.      A small hiatus hernia was present.      Patchy minimal inflammation characterized by erythema was found in the       gastric body. Biopsies were taken with a cold forceps for histology.       Biopsies were taken with a cold forceps for Helicobacter pylori testing.      A single 8 mm pedunculated polyp with no bleeding and no stigmata of       recent bleeding was found in the cardia. The polyp was removed with a       cold biopsy forceps. Resection and retrieval were complete.      The examined duodenum was normal.      The exam of the stomach was otherwise  normal.      multiple venous lakes noted in the mid and upper third of the esophagus.       No evidence of variuuces in the lower third of the esophagus or other       venous lakes. Impression:        - Z-line variable. Biopsied.                    -  Small hiatus hernia.                    - Erosive gastritis. Biopsied.                    - A single gastric polyp. Resected and retrieved.                    - Normal examined duodenum. Recommendation:    - Continue present medications.                    - Await pathology results.                    - Return to GI clinic in 4 weeks. Procedure Code(s): --- Professional ---                    [phone], Esophagogastroduodenoscopy, flexible, transoral;                     with biopsy, single or multiple Diagnosis Code(s): --- Professional ---                    530.89, Other specified disorders of esophagus                    535.40, Other specified gastritis, without mention of                     hemorrhage                    211.1, Benign neoplasm of stomach                    530.85, Barrett's esophagus                    536.8, Dyspepsia and other specified disorders of function                     of stomach                    553.3, Diaphragmatic hernia without mention of obstruction                     or gangrene CPT copyright 2014 [employer]. All rights reserved. The codes documented in this report are preliminary and upon coder review may  be revised to meet current compliance requirements. Lollie Sails, MD 12/14/2014 1:19:12 PM This report has been signed electronically. Number of Addenda: 0 Note Initiated On: 12/14/2014 12:52 PM      [place]

## 2014-12-16 ENCOUNTER — Encounter: Payer: Self-pay | Admitting: Gastroenterology

## 2014-12-16 LAB — SURGICAL PATHOLOGY

## 2014-12-17 ENCOUNTER — Encounter: Payer: Medicare Other | Admitting: *Deleted

## 2014-12-17 DIAGNOSIS — J449 Chronic obstructive pulmonary disease, unspecified: Secondary | ICD-10-CM

## 2014-12-17 NOTE — Progress Notes (Signed)
Daily Session Note  Patient Details  Name: Dorothy Church MRN: [id number] Date of Birth: 05-06-1935 Referring Provider:  Vilinda Boehringer, MD  Encounter Date: 12/17/2014  Check In:     Session Check In - 12/17/14 1157    Check-In   Staff Present Candiss Norse MS, ACSM CEP Exercise Physiologist;Stacey Blanch Media RRT, RCP Respiratory Therapist;Carroll Enterkin RN, BSN   ER physicians immediately available to respond to emergencies [facility] immediately available ER MD   Physician(s) Archie Balboa and Cinda Quest   Medication changes reported     No   Fall or balance concerns reported    No   Warm-up and Cool-down Performed on first and last piece of equipment   VAD Patient? No   Pain Assessment   Currently in Pain? No/denies   Multiple Pain Sites No         Goals Met:  Proper associated with RPD/PD & O2 Sat [place] with exercise equipment Using PLB without cueing & demonstrates good technique Exercise tolerated well Strength training completed today  Goals Unmet:  Not Applicable  Goals Comments: Progressing well in leg press study, met with Dr. Mel Almond today before class. Patient completed exercise prescription and all exercise goals during rehab session. The exercise was tolerated well and the patient is progressing in the program.    Dr. Emily Filbert is Medical Director for [place] and [facility].

## 2014-12-20 DIAGNOSIS — J449 Chronic obstructive pulmonary disease, unspecified: Secondary | ICD-10-CM | POA: Diagnosis not present

## 2014-12-20 NOTE — Progress Notes (Signed)
Daily Session Note  Patient Details  Name: Dorothy Church MRN: [id number] Date of Birth: Nov 14, 1935 Referring Provider:  Vilinda Boehringer, MD  Encounter Date: 12/20/2014  Check In:     Session Check In - 12/20/14 1244    Check-In   Staff Present Lestine Box BS, ACSM EP-C, Exercise Physiologist;Carroll Enterkin RN, BSN;Laureen [employer], RRT, Respiratory Therapist   ER physicians immediately available to respond to emergencies [facility] immediately available ER MD   Physician(s) Cinda Quest and goodman   Medication changes reported     No   Fall or balance concerns reported    No   Warm-up and Cool-down Performed on first and last piece of equipment   VAD Patient? No   Pain Assessment   Currently in Pain? No/denies           Exercise Prescription Changes - 12/20/14 1200    Exercise Review   Progression Yes   Response to Exercise   Blood Pressure (Admit) 98/60 mmHg   Blood Pressure (Exercise) 142/62 mmHg   Blood Pressure (Exit) 112/78 mmHg   Heart Rate (Admit) 60 bpm   Heart Rate (Exercise) 115 bpm   Heart Rate (Exit) 87 bpm   Oxygen Saturation (Admit) 98 %   Oxygen Saturation (Exercise) 96 %   Oxygen Saturation (Exit) 95 %   Rating of Perceived Exertion (Exercise) 13   Perceived Dyspnea (Exercise) 2   Symptoms none   Comments Increased [name]'s speed to 2.1 on the treadmill and she was able to complete that with no issue.   Duration Progress to 50 minutes of aerobic without signs/symptoms of physical distress   Intensity THRR unchanged   Progression Continue progressive overload as per policy without signs/symptoms or physical distress.   Resistance Training   Training Prescription Yes   Weight 2   Reps 10-12   Treadmill   MPH 2.1   Grade 1   Minutes 15   NuStep   Level 5   Watts 60   Minutes 15   Recumbant Elliptical   Level 6  BioStep   RPM 50   Watts 15   Minutes 15      Goals Met:  Proper associated with RPD/PD & O2 Sat Exercise tolerated well No  report of cardiac concerns or symptoms Strength training completed today  Goals Unmet:  Not Applicable  Goals Comments: Patient completed exercise prescription and all exercise goals during rehab sessions.The exercise was tolerated well, and the patient is progressing in the program.    Dr. Emily Filbert is Medical Director for [place] and [facility].

## 2014-12-22 ENCOUNTER — Encounter: Payer: Medicare Other | Attending: Internal Medicine

## 2014-12-22 DIAGNOSIS — J449 Chronic obstructive pulmonary disease, unspecified: Secondary | ICD-10-CM | POA: Diagnosis not present

## 2014-12-22 NOTE — Progress Notes (Signed)
Daily Session Note  Patient Details  Name: Gracemarie Skeet MRN: [id number] Date of Birth: May 14, 1935 Referring Provider:  Vilinda Boehringer, MD  Encounter Date: 12/22/2014  Check In:     Session Check In - 12/22/14 1153    Check-In   Staff Present Lestine Box BS, ACSM EP-C, Exercise Physiologist;Laureen Janell Quiet, RRT, Respiratory Therapist   ER physicians immediately available to respond to emergencies [facility] immediately available ER MD   Physician(s) paduchowski and mcshane   Medication changes reported     No   Fall or balance concerns reported    No   Warm-up and Cool-down Performed on first and last piece of equipment   VAD Patient? No   Pain Assessment   Currently in Pain? No/denies         Goals Met:  Proper associated with RPD/PD & O2 Sat Exercise tolerated well No report of cardiac concerns or symptoms Strength training completed today  Goals Unmet:  Not Applicable  Goals Comments: Patient completed exercise prescription and all exercise goals during rehab sessions.The exercise was tolerated well, and the patient is progressing in the program.    Dr. Emily Filbert is Medical Director for [place] and [facility].

## 2014-12-24 ENCOUNTER — Encounter: Payer: Medicare Other | Admitting: *Deleted

## 2014-12-24 DIAGNOSIS — J449 Chronic obstructive pulmonary disease, unspecified: Secondary | ICD-10-CM | POA: Diagnosis not present

## 2014-12-24 NOTE — Progress Notes (Signed)
Daily Session Note  Patient Details  Name: Dorothy Church MRN: [id number] Date of Birth: 11-23-1935 Referring Provider:  Lucille Passy, MD  Encounter Date: 12/24/2014  Check In:     Session Check In - 12/24/14 1149    Check-In   Staff Present Frederich Cha RRT, RCP Respiratory Therapist;Adit Riddles RN, Drusilla Kanner MS, ACSM CEP Exercise Physiologist   ER physicians immediately available to respond to emergencies [facility] immediately available ER MD   Physician(s) Dr. Marcelene [place]te and Dr. Archie Balboa   Medication changes reported     No   Fall or balance concerns reported    No   Warm-up and Cool-down Performed on first and last piece of equipment   VAD Patient? No   Pain Assessment   Currently in Pain? No/denies         Goals Met:  Proper associated with RPD/PD & O2 Sat Exercise tolerated well  Goals Unmet:  Not Applicable  Goals Comments:    Dr. Emily Filbert is Medical Director for [place] and [facility].

## 2014-12-27 ENCOUNTER — Encounter: Payer: Self-pay | Admitting: Respiratory Therapy

## 2014-12-27 ENCOUNTER — Encounter: Payer: Medicare Other | Admitting: *Deleted

## 2014-12-27 DIAGNOSIS — J449 Chronic obstructive pulmonary disease, unspecified: Secondary | ICD-10-CM

## 2014-12-27 DIAGNOSIS — G4733 Obstructive sleep apnea (adult) (pediatric): Secondary | ICD-10-CM

## 2014-12-27 NOTE — Progress Notes (Signed)
Daily Session Note  Patient Details  Name: Dorothy Church MRN: [id number] Date of Birth: 02-26-1935 Referring Provider:  Vilinda Boehringer, MD  Encounter Date: 12/27/2014  Check In:     Session Check In - 12/27/14 1309    Check-In   Staff Present Laureen Owens Shark BS, RRT, Respiratory Therapist;Cormac Wint RN, BSN;Steven Way BS, ACSM EP-C, Exercise Physiologist   ER physicians immediately available to respond to emergencies [facility] immediately available ER MD   Physician(s) Dr. Joni Fears and Dr. Jimmye Norman   Medication changes reported     No   Fall or balance concerns reported    No   Warm-up and Cool-down Performed on first and last piece of equipment   VAD Patient? No   Pain Assessment   Currently in Pain? No/denies         Goals Met:  Proper associated with RPD/PD & O2 Sat Exercise tolerated well  Goals Unmet:  Not Applicable  Goals Comments:    Dr. Emily Filbert is Medical Director for [place] and [facility].

## 2014-12-27 NOTE — Progress Notes (Signed)
Pulmonary Individual Treatment Plan  Patient Details  Name: Dorothy Church MRN: [id number] Date of Birth: [birth year] Referring Provider:  Dr Vilinda Boehringer  Initial Encounter Date: 10/05/2014  Visit Diagnosis: COPD, moderate ([place])  Patient's Home Medications on Admission:  Current outpatient prescriptions:    albuterol (VENTOLIN HFA) 108 (90 BASE) MCG/ACT inhaler, Inhale 2 puffs into the lungs every 6 (six) hours as needed for wheezing., Disp: 18 g, Rfl: 5   aspirin 81 MG tablet, Take 81 mg by mouth daily., Disp: , Rfl:    budesonide-formoterol (SYMBICORT) 160-4.5 MCG/ACT inhaler, Inhale 2 puffs into the lungs daily as needed., Disp: 1 Inhaler, Rfl: 1   calcium carbonate (OS-CAL) 600 MG TABS, Take 600 mg by mouth daily., Disp: , Rfl:    denosumab (PROLIA) 60 MG/ML SOLN injection, Inject 60 mg into the skin every 6 (six) months. Administer in upper arm, thigh, or abdomen, Disp: , Rfl:    esomeprazole (NEXIUM) 40 MG capsule, Take 40 mg by mouth daily before breakfast., Disp: , Rfl:    ipratropium-albuterol (DUONEB) 0.5-2.5 (3) MG/3ML SOLN, Take 3 mLs by nebulization every 8 (eight) hours as needed. 1 ampule 3 times a day for 3 days., Disp: 360 mL, Rfl: 3   levothyroxine (SYNTHROID, LEVOTHROID) 50 MCG tablet, Take 1 tablet (50 mcg  total) by mouth daily  before breakfast., Disp: 30 tablet, Rfl: 3   Multiple Vitamin (MULTIVITAMIN) tablet, Take 1 tablet by mouth daily., Disp: , Rfl:    PARoxetine (PAXIL) 30 MG tablet, Take 1 tablet by mouth  every morning, Disp: 90 tablet, Rfl: 1   simvastatin (ZOCOR) 40 MG tablet, Take 1 tablet (40 mg total) by mouth daily at 6 PM. NEED TO MAKE APPOINTMENT FOR ANNUAL PHYSICAL, Disp: 90 tablet, Rfl: 0   Tiotropium Bromide Monohydrate (SPIRIVA RESPIMAT) 2.5 MCG/ACT AERS, Inhale 2 puffs into the lungs daily. Gargle and rinse after each use., Disp: 3 Inhaler, Rfl: 2  Past Medical History: Past Medical History  Diagnosis Date   Intrinsic asthma,  unspecified    Aortic valve disorders    Obstructive sleep apnea (adult) (pediatric)    Vitamin D deficiency    Barrett's esophagus    Impaired fasting glucose    COPD (chronic obstructive pulmonary disease) (HCC)    Syncope and collapse    Hypothyroidism    Osteoporosis    Pure hypercholesterolemia    Arthritis     Tobacco Use: History  Smoking status   Never Smoker   Smokeless tobacco   Never Used    Labs: Recent Review Flowsheet Data    Labs for ITP Cardiac and Pulmonary Rehab Latest Ref Rng 07/21/2012 04/23/2013 09/02/2014   Cholestrol 0 - 200 mg/dL - 149 175   LDLCALC 0 - 99 mg/dL 72 78 93   HDL >39.00 mg/dL 65 55.50 63.80   Trlycerides 0.0 - 149.0 mg/dL 54 77.0 89.0       ADL UCSD:     ADL UCSD      10/05/14 1120 11/19/14 1531     ADL UCSD   ADL Phase  Mid    SOB Score total 42 61    Rest 0 1    Walk 2 1    Stairs 2 2    Bath 3 2    Dress 3 1    Shop 3 2        Pulmonary Function Assessment:     Pulmonary Function Assessment - 10/05/14 1130    Pulmonary Function Tests  RV% 143 %   DLCO% 66 %   Initial Spirometry Results   FVC% 57 %   FEV1% 39 %   FEV1/FVC Ratio 51   Post Bronchodilator Spirometry Results   FVC% 75 %   FEV1% 62 %   FEV1/FVC Ratio 62   Breath   Bilateral Breath Sounds Clear;Decreased      Exercise Target Goals:    Exercise Program Goal: Individual exercise prescription set with THRR, safety & activity barriers. Participant demonstrates ability to understand and report RPE using BORG scale, to self-measure pulse accurately, and to acknowledge the importance of the exercise prescription.  Exercise Prescription Goal: Starting with aerobic activity 30 plus minutes a day, 3 days per week for initial exercise prescription. Provide home exercise prescription and guidelines that participant acknowledges understanding prior to discharge.  Activity Barriers & Risk Stratification:     Activity Barriers & Risk  Stratification - 10/05/14 1120    Activity Barriers & Risk Stratification   Activity Barriers Shortness of Breath   Risk Stratification Low      6 Minute Walk:     6 Minute Walk      10/05/14 1120 10/07/14 0854 11/19/14 1238   6 Minute Walk   Phase Initial Initial Mid Program   Distance 1140 feet 1140 feet 1440 feet   Walk Time 6 minutes 6 minutes 6 minutes   Resting HR 80 bpm 80 bpm 89 bpm   Resting BP 110/60 mmHg 110/60 mmHg 114/62 mmHg   Max Ex. HR 114 bpm 114 bpm 130 bpm   Max Ex. BP 124/68 mmHg 124/68 mmHg 150/68 mmHg   RPE '15 15 13   ' Perceived Dyspnea  '3 3 3   ' Symptoms  No No      Initial Exercise Prescription:     Initial Exercise Prescription - 10/07/14 0800    Date of Initial Exercise Prescription   Date 10/05/14   Treadmill   MPH 1.5   Grade 0   Minutes 10   Recumbant Bike   Level 2   RPM 40   Watts 20   Minutes 10   NuStep   Level 2   Watts 40   Minutes 10   Arm Ergometer   Level 1   Watts 10   Minutes 10   REL-XR   Level 2   Watts 40   Minutes 10   Prescription Details   Frequency (times per week) 3   Duration Progress to 30 minutes of continuous aerobic without signs/symptoms of physical distress   Intensity   THRR REST +  30   Ratings of Perceived Exertion 11-15   Perceived Dyspnea 2-4   Progression Continue progressive overload as per policy without signs/symptoms or physical distress.   Resistance Training   Training Prescription Yes   Weight 2   Reps 10-15      Exercise Prescription Changes:     Exercise Prescription Changes      10/11/14 1400 10/15/14 1100 10/18/14 1200 10/20/14 1200 10/22/14 1200   Exercise Review   Progression    Yes Yes   Response to Exercise   Blood Pressure (Admit) 100/64 mmHg 100/64 mmHg 100/64 mmHg     Blood Pressure (Exercise) 122/62 mmHg 122/62 mmHg 122/62 mmHg     Blood Pressure (Exit) 116/72 mmHg 116/72 mmHg 116/72 mmHg     Heart Rate (Admit) 87 bpm 87 bpm 87 bpm     Heart Rate (Exercise) 120  bpm 120 bpm 120 bpm  Heart Rate (Exit) 83 bpm 83 bpm 83 bpm     Oxygen Saturation (Admit) 94 % 94 % 94 %     Oxygen Saturation (Exercise) 95 % 95 % 95 %     Oxygen Saturation (Exit) 97 % 97 % 97 %     Rating of Perceived Exertion (Exercise) '13 13 13     ' Perceived Dyspnea (Exercise) '2 2 2     ' Duration    Progress to 50 minutes of aerobic without signs/symptoms of physical distress Progress to 50 minutes of aerobic without signs/symptoms of physical distress   Intensity    THRR unchanged THRR unchanged   Progression    Continue progressive overload as per policy without signs/symptoms or physical distress. Continue progressive overload as per policy without signs/symptoms or physical distress.   Resistance Training   Training Prescription Yes Yes Yes Yes Yes   Weight '2 2 2 2 2   ' Reps 10-12 10-12 10-12 10-12 10-12   Treadmill   MPH 1.5 1.5 1.8 1.8 1.8   Grade 0 0 0 0 0   Minutes '10 10 10 12 12   ' NuStep   Level '2 2 2 2 3   ' Watts 40 40 40 40 45   Minutes '10 10 15 15 15   ' Recumbant Elliptical   Level 2  BioStep 2  BioStep 2  BioStep 2  BioStep 2  BioStep   RPM '30 30 30 30 30   ' Watts '10 15 15 15 15     ' 10/29/14 1200 11/01/14 1600 11/08/14 1200 11/15/14 1200 11/19/14 1200   Exercise Review   Progression Yes Yes Yes Yes Yes   Response to Exercise   Blood Pressure (Admit)  100/60 mmHg      Blood Pressure (Exercise)  146/68 mmHg      Blood Pressure (Exit)  100/60 mmHg      Heart Rate (Admit)  114 bpm      Heart Rate (Exercise)  108 bpm      Heart Rate (Exit)  84 bpm      Oxygen Saturation (Admit)  96 %      Oxygen Saturation (Exercise)  95 %      Oxygen Saturation (Exit)  92 %      Rating of Perceived Exertion (Exercise)  13      Perceived Dyspnea (Exercise)  2      Comments     Reviewed individualized exercise prescription and made increases per departmental policy. Exercise increases were discussed with the patient and they were able to perform the new work loads without issue  (no signs or symptoms).  Michela has also been increasing in weight lifted on the leg press almost every session. Completed the mid program 6MW test and did very well.     Duration Progress to 50 minutes of aerobic without signs/symptoms of physical distress Progress to 50 minutes of aerobic without signs/symptoms of physical distress Progress to 50 minutes of aerobic without signs/symptoms of physical distress Progress to 50 minutes of aerobic without signs/symptoms of physical distress Progress to 50 minutes of aerobic without signs/symptoms of physical distress   Intensity THRR unchanged THRR unchanged THRR unchanged THRR unchanged THRR unchanged   Progression Continue progressive overload as per policy without signs/symptoms or physical distress. Continue progressive overload as per policy without signs/symptoms or physical distress. Continue progressive overload as per policy without signs/symptoms or physical distress. Continue progressive overload as per policy without signs/symptoms or physical distress. Continue progressive  overload as per policy without signs/symptoms or physical distress.   Resistance Training   Training Prescription Yes Yes Yes Yes Yes   Weight '2 2 2 2 2   ' Reps 10-12 10-12 10-12 10-12 10-12   Treadmill   MPH 1.'8 2 2 2 2   ' Grade 0 0 0 1 1   Minutes '15 15 15 15 15   ' NuStep   Level '3 3 3 3 3   ' Watts 30 45 45 45 45   Minutes '15 15 15 15 15   ' Recumbant Elliptical   Level 2  BioStep 3  BioStep 4  BioStep 4  BioStep 4  BioStep   RPM 30 30 35 35 35   Watts '15 15 15 15 15   ' Minutes    15 15     11/22/14 1200 11/26/14 1400 11/29/14 1100 12/03/14 1100 12/08/14 1200   Exercise Review   Progression Yes  Yes Yes Yes   Response to Exercise   Blood Pressure (Admit) 120/70 mmHg       Blood Pressure (Exercise) 134/72 mmHg       Blood Pressure (Exit) 100/68 mmHg       Heart Rate (Admit) 84 bpm       Heart Rate (Exercise) 118 bpm       Heart Rate (Exit) 85 bpm        Oxygen Saturation (Admit) 97 %       Oxygen Saturation (Exercise) 94 %       Oxygen Saturation (Exit) 96 %       Rating of Perceived Exertion (Exercise) 13       Perceived Dyspnea (Exercise) 3       Comments Reviewed individualized exercise prescription and made increases per departmental policy. Exercise increases were discussed with the patient and they were able to perform the new work loads without issue (no signs or symptoms).  Mila has also been increasing in weight lifted on the leg press almost every session. Completed the mid program 6MW test and did very well.     Reviewed individualized exercise prescription and made increases per departmental policy. Exercise increases were discussed with the patient and they were able to perform the new work loads without issue (no signs or symptoms).  2.49m of Albuterol SVN was given due to SOB.  Ms. DMckennastated that she forgot to take her inhaler today. Increased Chemere's speed to 2.1 on the treadmill and she was able to complete that with no issue.   Duration Progress to 50 minutes of aerobic without signs/symptoms of physical distress   Progress to 50 minutes of aerobic without signs/symptoms of physical distress Progress to 50 minutes of aerobic without signs/symptoms of physical distress   Intensity THRR unchanged   THRR unchanged THRR unchanged   Progression Continue progressive overload as per policy without signs/symptoms or physical distress.   Continue progressive overload as per policy without signs/symptoms or physical distress. Continue progressive overload as per policy without signs/symptoms or physical distress.   Resistance Training   Training Prescription Yes   Yes Yes   Weight '2   2 2   ' Reps 10-12   10-12 10-12   Treadmill   MPH 2   2 2.1   Grade '1   1 1   ' Minutes '15   15 15   ' NuStep   Level '4   5 5   ' Watts 50   60 60   Minutes 15   15 15  Recumbant Elliptical   Level 4  BioStep -- 5  Biostep 5  BioStep 5  BioStep    RPM 35   45 45   Watts 15  45 15 15   Minutes '15   15 15     ' 12/20/14 1000 12/20/14 1200         Exercise Review   Progression Yes Yes      Response to Exercise   Blood Pressure (Admit) 98/60 mmHg 98/60 mmHg      Blood Pressure (Exercise) 142/62 mmHg 142/62 mmHg      Blood Pressure (Exit) 112/78 mmHg 112/78 mmHg      Heart Rate (Admit) 60 bpm 60 bpm      Heart Rate (Exercise) 115 bpm 115 bpm      Heart Rate (Exit) 87 bpm 87 bpm      Oxygen Saturation (Admit) 98 % 98 %      Oxygen Saturation (Exercise) 96 % 96 %      Oxygen Saturation (Exit) 95 % 95 %      Rating of Perceived Exertion (Exercise) 13 13      Perceived Dyspnea (Exercise) 2 2      Symptoms none none      Comments Increased Jaydalynn's speed to 2.1 on the treadmill and she was able to complete that with no issue. Increased Kammi's speed to 2.1 on the treadmill and she was able to complete that with no issue.      Duration Progress to 50 minutes of aerobic without signs/symptoms of physical distress Progress to 50 minutes of aerobic without signs/symptoms of physical distress      Intensity THRR unchanged THRR unchanged      Progression Continue progressive overload as per policy without signs/symptoms or physical distress. Continue progressive overload as per policy without signs/symptoms or physical distress.      Resistance Training   Training Prescription Yes Yes      Weight 2 2      Reps 10-12 10-12      Treadmill   MPH 2.1 2.1      Grade 1 1      Minutes 15 15      NuStep   Level 5 5      Watts 60 60      Minutes 15 15      Recumbant Elliptical   Level 5  BioStep 6  BioStep      RPM 45 50      Watts 15 15      Minutes 15 15         Discharge Exercise Prescription (Final Exercise Prescription Changes):     Exercise Prescription Changes - 12/20/14 1200    Exercise Review   Progression Yes   Response to Exercise   Blood Pressure (Admit) 98/60 mmHg   Blood Pressure (Exercise) 142/62 mmHg   Blood  Pressure (Exit) 112/78 mmHg   Heart Rate (Admit) 60 bpm   Heart Rate (Exercise) 115 bpm   Heart Rate (Exit) 87 bpm   Oxygen Saturation (Admit) 98 %   Oxygen Saturation (Exercise) 96 %   Oxygen Saturation (Exit) 95 %   Rating of Perceived Exertion (Exercise) 13   Perceived Dyspnea (Exercise) 2   Symptoms none   Comments Increased Abrish's speed to 2.1 on the treadmill and she was able to complete that with no issue.   Duration Progress to 50 minutes of aerobic without signs/symptoms of physical distress   Intensity THRR  unchanged   Progression Continue progressive overload as per policy without signs/symptoms or physical distress.   Resistance Training   Training Prescription Yes   Weight 2   Reps 10-12   Treadmill   MPH 2.1   Grade 1   Minutes 15   NuStep   Level 5   Watts 60   Minutes 15   Recumbant Elliptical   Level 6  BioStep   RPM 50   Watts 15   Minutes 15       Nutrition:  Target Goals: Understanding of nutrition guidelines, daily intake of sodium <1527m, cholesterol <2093m calories 30% from fat and 7% or less from saturated fats, daily to have 5 or more servings of fruits and vegetables.  Biometrics:     Pre Biometrics - 10/07/14 0856    Pre Biometrics   Height '5\' 7"'  (1.702 m)   Weight 144 lb 9.6 oz (65.59 kg)   Waist Circumference 32 inches   Hip Circumference 38 inches   Waist to Hip Ratio 0.84 %   BMI (Calculated) 22.7       Nutrition Therapy Plan and Nutrition Goals:     Nutrition Therapy & Goals - 10/05/14 1120    Nutrition Therapy   Diet Prefers not to meet with the dietitian; She eats fruits and vegetables mainly      Nutrition Discharge: Rate Your Plate Scores:   Psychosocial: Target Goals: Acknowledge presence or absence of depression, maximize coping skills, provide positive support system. Participant is able to verbalize types and ability to use techniques and skills needed for reducing stress and depression.  Initial Review  & Psychosocial Screening:     Initial Psych Review & Screening - 10/05/14 1120    Family Dynamics   Good Support System? Yes  Ms DuGrandpreas good support from her son and friends.   Barriers   Psychosocial barriers to participate in program There are no identifiable barriers or psychosocial needs.;The patient should benefit from training in stress management and relaxation.   Screening Interventions   Interventions Encouraged to exercise      Quality of Life Scores:   PHQ-9:     Recent Review Flowsheet Data    [facility] 10/05/2014 09/02/2014 04/23/2013   Decreased Interest 0 0 0   Down, Depressed, Hopeless 0 0 0   PHQ - 2 Score 0 0 0      Psychosocial Evaluation and Intervention:     Psychosocial Evaluation - 10/27/14 1246    Psychosocial Evaluation & Interventions   Interventions Stress management education;Relaxation education;Encouraged to exercise with the program and follow exercise prescription   Comments Counselor met with Ms. DuAmanoday for initial psychosocial evaluation.  She is a [age]ear old who has been diagnosed with Emphysema about a year ago.  She lives alone but reports a strong support system living in a retirement community and actively involved in her local church.  She also has sleep apnea and uses a CPAP.  She denies a history of depression or current symptoms.  She admits her mood is typically positive/optimistic but she does have a daughter with some mental health issues and Ms. D's own health issues that cause some stress on occasion.  She has goals to improve her overall health, increase her stamina and strength and to learn to exercise more consistently.     Continued Psychosocial Services Needed Yes  Due to Ms. Archuletta's current stressors, she will benefit from the psychoeducational components of this  program, particularly stress management and depression.       Psychosocial Re-Evaluation:     Psychosocial Re-Evaluation      12/22/14  1253           Psychosocial Re-Evaluation   Interventions Stress management education;Relaxation education       Comments Follow up with Ms. Skilton today reporting feeling stronger since coming into this program, especially in her legs.  She is experiencing less pain and discomfort in her joints.  She continues to have a positive mood and outlook on life.  She reports the educational components of this program have been helpful, as she attended the Stress Management program today, and stated she "wish she had known this information earlier in life."  Ms. Divirgilio is recognizing the benefit of consistent exercise and isworking hard to accomplish her goals.           Education: Education Goals: Education classes will be provided on a weekly basis, covering required topics. Participant will state understanding/return demonstration of topics presented.  Learning Barriers/Preferences:     Learning Barriers/Preferences - 10/05/14 1130    Learning Barriers/Preferences   Learning Barriers None   Learning Preferences Group Instruction;Individual Instruction;Pictoral;Skilled Demonstration;Verbal Instruction;Video;Written Material      Education Topics: Initial Evaluation Education: - Verbal, written and demonstration of respiratory meds, RPE/PD scales, oximetry and breathing techniques. Instruction on use of nebulizers and MDIs: cleaning and proper use, rinsing mouth with steroid doses and importance of monitoring MDI activations.          Pulmonary Rehab from 12/27/2014 in [place]   Date  10/05/14   Educator  LB   Instruction Review Code  2- meets goals/outcomes      General Nutrition Guidelines/Fats and Fiber: -Group instruction provided by verbal, written material, models and posters to present the general guidelines for heart healthy nutrition. Gives an explanation and review of dietary fats and fiber.      Pulmonary Rehab from 12/27/2014 in  [place]   Date  11/29/14   Educator  P. Simpson   Instruction Review Code  2- meets goals/outcomes      Controlling Sodium/Reading Food Labels: -Group verbal and written material supporting the discussion of sodium use in heart healthy nutrition. Review and explanation with models, verbal and written materials for utilization of the food label.      Pulmonary Rehab from 12/27/2014 in [place]   Date  10/11/14   Educator  C. Joneen Caraway, RD   Instruction Review Code  2- meets goals/outcomes      Exercise Physiology & Risk Factors: - Group verbal and written instruction with models to review the exercise physiology of the cardiovascular system and associated critical values. Details cardiovascular disease risk factors and the goals associated with each risk factor.      Pulmonary Rehab from 12/27/2014 in [place]   Date  10/27/14   Educator  SW   Instruction Review Code  2- meets goals/outcomes      Aerobic Exercise & Resistance Training: - Gives group verbal and written discussion on the health impact of inactivity. On the components of aerobic and resistive training programs and the benefits of this training and how to safely progress through these programs.   Flexibility, Balance, General Exercise Guidelines: - Provides group verbal and written instruction on the benefits of flexibility and balance training programs. Provides general exercise guidelines with specific  guidelines to those with heart or lung disease. Demonstration and skill practice provided.   Stress Management: - Provides group verbal and written instruction about the health risks of elevated stress, cause of high stress, and healthy ways to reduce stress.      Pulmonary Rehab from 12/27/2014 in [place]   Date  12/22/14   Educator  [facility]   Instruction Review  Code  2- meets goals/outcomes      Depression: - Provides group verbal and written instruction on the correlation between heart/lung disease and depressed mood, treatment options, and the stigmas associated with seeking treatment.   Exercise & Equipment Safety: - Individual verbal instruction and demonstration of equipment use and safety with use of the equipment.      Pulmonary Rehab from 12/27/2014 in [place]   Date  11/22/14   Educator  C. Enterkin,RN   Instruction Review Code  2- meets goals/outcomes      Infection Prevention: - Provides verbal and written material to individual with discussion of infection control including proper hand washing and proper equipment cleaning during exercise session.      Pulmonary Rehab from 12/27/2014 in [place]   Date  10/11/14   Educator  LB   Instruction Review Code  2- meets goals/outcomes      Falls Prevention: - Provides verbal and written material to individual with discussion of falls prevention and safety.      Pulmonary Rehab from 12/27/2014 in [place]   Date  10/05/14   Educator  LB   Instruction Review Code  2- meets goals/outcomes      Diabetes: - Individual verbal and written instruction to review signs/symptoms of diabetes, desired ranges of glucose level fasting, after meals and with exercise. Advice that pre and post exercise glucose checks will be done for 3 sessions at entry of program.      Pulmonary Rehab from 12/27/2014 in [place]   Date  10/22/14   Educator  CE   Instruction Review Code  2- meets goals/outcomes      Chronic Lung Diseases: - Group verbal and written instruction to review new updates, new respiratory medications, new advancements in procedures and treatments. Provide informative websites and "800" numbers of self-education.      Pulmonary  Rehab from 12/27/2014 in [place]   Date  12/27/14   Educator  L. Owens Shark, RT   Instruction Review Code  2- meets goals/outcomes      Lung Procedures: - Group verbal and written instruction to describe testing methods done to diagnose lung disease. Review the outcome of test results. Describe the treatment choices: Pulmonary Function Tests, ABGs and oximetry.      Pulmonary Rehab from 12/27/2014 in [place]   Date  12/24/14   Educator  Frederich Cha, RT   Instruction Review Code  2- meets goals/outcomes      Energy Conservation: - Provide group verbal and written instruction for methods to conserve energy, plan and organize activities. Instruct on pacing techniques, use of adaptive equipment and posture/positioning to relieve shortness of breath.   Triggers: - Group verbal and written instruction to review types of environmental controls: home humidity, furnaces, filters, dust mite/pet prevention, HEPA vacuums. To discuss weather changes, air quality and the benefits of nasal washing.      Pulmonary Rehab from  12/27/2014 in [place]   Date  12/06/14   Educator  L. Owens Shark, RT   Instruction Review Code  2- meets goals/outcomes      Exacerbations: - Group verbal and written instruction to provide: warning signs, infection symptoms, calling MD promptly, preventive modes, and value of vaccinations. Review: effective airway clearance, coughing and/or vibration techniques. Create an Sports administrator.   Oxygen: - Individual and group verbal and written instruction on oxygen therapy. Includes supplement oxygen, available portable oxygen systems, continuous and intermittent flow rates, oxygen safety, concentrators, and Medicare reimbursement for oxygen.   Respiratory Medications: - Group verbal and written instruction to review medications for lung disease. Drug class, frequency,  complications, importance of spacers, rinsing mouth after steroid MDI's, and proper cleaning methods for nebulizers.      Pulmonary Rehab from 12/27/2014 in [place]   Date  10/05/14   Educator  [facility]   Instruction Review Code  2- meets goals/outcomes      AED/CPR: - Group verbal and written instruction with the use of models to demonstrate the basic use of the AED with the basic ABC's of resuscitation.   Breathing Retraining: - Provides individuals verbal and written instruction on purpose, frequency, and proper technique of diaphragmatic breathing and pursed-lipped breathing. Applies individual practice skills.      Pulmonary Rehab from 12/27/2014 in [place]   Date  10/05/14   Educator  [facility]   Instruction Review Code  2- meets goals/outcomes      Anatomy and Physiology of the Lungs: - Group verbal and written instruction with the use of models to provide basic lung anatomy and physiology related to function, structure and complications of lung disease.      Pulmonary Rehab from 12/27/2014 in [place]   Date  11/05/14   Educator  [facility]   Instruction Review Code  2- meets goals/outcomes      Heart Failure: - Group verbal and written instruction on the basics of heart failure: signs/symptoms, treatments, explanation of ejection fraction, enlarged heart and cardiomyopathy.      Pulmonary Rehab from 12/27/2014 in [place]   Date  12/10/14   Educator  [place]   Instruction Review Code  2- meets goals/outcomes      Sleep Apnea: - Individual verbal and written instruction to review Obstructive Sleep Apnea. Review of risk factors, methods for diagnosing and types of masks and machines for OSA.   Anxiety: - Provides group, verbal and written instruction on the correlation between heart/lung disease and anxiety, treatment options, and  management of anxiety.      Pulmonary Rehab from 12/27/2014 in [place]   Date  10/13/14   Educator  [facility]   Instruction Review Code  2- Meets goals/outcomes      Relaxation: - Provides group, verbal and written instruction about the benefits of relaxation for patients with heart/lung disease. Also provides patients with examples of relaxation techniques.   Knowledge Questionnaire Score:     Knowledge Questionnaire Score - 10/05/14 1120    Knowledge Questionnaire Score   Pre Score -2      Personal Goals and Risk Factors at Admission:     Personal Goals and Risk Factors at Admission - 10/05/14 1120    Personal Goals and Risk Factors on Admission    Weight Management No   Increase Aerobic Exercise and  Physical Activity Yes   Intervention While in program, learn and follow the exercise prescription taught. Start at a low level workload and increase workload after able to maintain previous level for 30 minutes. Increase time before increasing intensity.  Ms Yaklin lives at [facility] and can use their gym .   Understand more about Heart/Pulmonary Disease. Yes   Intervention While in program utilize professionals for any questions, and attend the education sessions. Great websites to use are www.americanheart.org or www.lung.org for reliable information.  Ms Muldoon would be interested in the education on COPD and other education for daily living management.   Improve shortness of breath with ADL's Yes   Intervention While in program, learn and follow the exercise prescription taught. Start at a low level workload and increase workload ad advised by the exercise physiologist. Increase time before increasing intensity.   Develop more efficient breathing techniques such as purse lipped breathing and diaphragmatic breathing; and practicing self-pacing with activity Yes   Intervention While in program, learn and utilize the specific breathing techniques  taught to you. Continue to practice and use the techniques as needed.   Increase knowledge of respiratory medications and ability to use respiratory devices properly.  Yes   Intervention While in program, learn to administer MDI, nebulizer, and spacer properly.;Learn to take respiratory medicine as ordered.;While in program, learn to Clean MDI, nebulizers, and spacers properly.  Ms Glaza uses Spiriva, Ventolin MDI and SVN, Symbicort, and a spacer.  she is also on CPAP - full face mask; [employer].   Lipids Yes      Personal Goals and Risk Factors Review:      Goals and Risk Factor Review      10/15/14 1400 10/20/14 1231 10/29/14 1345 11/03/14 1130 11/05/14 1542   Increase Aerobic Exercise and Physical Activity   Goals Progress/Improvement seen  Yes   Yes    Comments Ashlynn is very happy with her progress so far and has already made several increases on the equipment. Her time on the equipment has been lengthened and she can almost exercise for the enitre class already. She is very eager to continue to make improvements.    Ms Tetro increased her leg press weight today to 55lbs. She is so motivaed to work hard at her exercise goals.    Understand more about Heart/Pulmonary Disease   Goals Progress/Improvement seen   Yes   Yes   Comments  She has only had two education classes so far (diet and relaxation), but she enjoyed these sessions and both had good reminders of topics she already knew.    Education on [employer] was given today.  Ms. Michelle was also provided with a few websites to vist to provide more education on heart and lungs.   Improve shortness of breath with ADL's   Goals Progress/Improvement seen   No      Comments  She has not noticed a change in her SOB, the exercise equipment is still challenging for her, but she is making increases and exercising for longer periods. She has not noticed any change with activities she does at home. The heat may be a contributor to her still  feeling short of breath with outdoor and household tasks.       Increase knowledge of respiratory medications   Goals Progress/Improvement seen    Yes     Comments   Ms. Fratus was given a spacer to use with her MDI at home.  She was instructed  on how to use it and she was impressed with the whistle feature on this device.       11/15/14 1130 11/23/14 0858 12/01/14 1130 12/17/14 1406 12/20/14 1130   Increase Aerobic Exercise and Physical Activity   Goals Progress/Improvement seen  Yes Yes   Yes   Comments Ms Sebek has increased her leg press weight to 60lbs.  She has had several increases in her exercise goals, including a 1% grade on the TM. Ms Kiehn increased her leg press weight to 70lbs times 20 reps. She also increased her mid 50md by [id number] - Minimal Importance Difference for COPD is [id number]  Ms DudGrenzcreased her leg press to 100lbs x 18reps   Improve shortness of breath with ADL's   Goals Progress/Improvement seen     Yes    Comments  Ms DudNiccolis noticed an improvement in her walking distance with less shortness of breath.  Ms. DudArons noticed that she is having some drainage and that she is a little more short of breath than usual.  We talked about different Nasal spray, medications and steam to help with this.    Breathing Techniques   Goals Progress/Improvement seen   Yes  Yes    Comments  Ms DudPickinges PLB with her walking from the parking lot and walking her dog; plus with her exercise in [facility] - very helpful breathing technique.  Ms. DudSimmeringows proper technique when using PLB while walking on the TM.     Increase knowledge of respiratory medications   Goals Progress/Improvement seen    Yes     Comments   Educated Ms DudBlower her Duoneb and Symbicort. We discussed the similarities of Atrovent with Spiriva and Pulmicort with the steroid in her Symbicort, so she could possibly use a nebulizer during her insurance "donut whole".       12/22/14 1130 12/22/14 1221          Increase Aerobic Exercise and Physical Activity   Goals Progress/Improvement seen   Yes      Comments  Gaining good strength with program, also helping that she is participating in the [place] as well.      Understand more about Heart/Pulmonary Disease   Goals Progress/Improvement seen  Yes       Comments Educated Ms DudSedivy her PFT and how it relates to her COPD and the structure of her lungs.          Personal Goals Discharge (Final Personal Goals and Risk Factors Review):      Goals and Risk Factor Review - 12/22/14 1221    Increase Aerobic Exercise and Physical Activity   Goals Progress/Improvement seen  Yes   Comments Gaining good strength with program, also helping that she is participating in the [employer] as well.      Comments: 30 day note review

## 2014-12-29 VITALS — Ht 67.0 in | Wt 146.0 lb

## 2014-12-29 DIAGNOSIS — J449 Chronic obstructive pulmonary disease, unspecified: Secondary | ICD-10-CM

## 2014-12-29 NOTE — Progress Notes (Signed)
Daily Session Note  Patient Details  Name: Dorothy Church MRN: [id number] Date of Birth: 1936-01-24 Referring Provider:  Vilinda Boehringer, MD  Encounter Date: 12/29/2014  Check In:     Session Check In - 12/29/14 1119    Check-In   Staff Present Lestine Box BS, ACSM EP-C, Exercise Physiologist;Renee Dillard Essex MS, ACSM CEP Exercise Physiologist;Laureen Janell Quiet, RRT, Respiratory Therapist   ER physicians immediately available to respond to emergencies [facility] immediately available ER MD   Physician(s) Marcelene Butte and Darl Householder   Medication changes reported     No   Fall or balance concerns reported    No   Warm-up and Cool-down Performed on first and last piece of equipment   VAD Patient? No   Pain Assessment   Currently in Pain? No/denies         Goals Met:  Proper associated with RPD/PD & O2 Sat Exercise tolerated well No report of cardiac concerns or symptoms Strength training completed today  Goals Unmet:  Not Applicable  Goals Comments: Performed post 6MW test   Dr. Emily Filbert is Medical Director for [place] and [facility].

## 2014-12-31 ENCOUNTER — Encounter: Payer: Medicare Other | Admitting: *Deleted

## 2014-12-31 DIAGNOSIS — J449 Chronic obstructive pulmonary disease, unspecified: Secondary | ICD-10-CM | POA: Diagnosis not present

## 2014-12-31 NOTE — Progress Notes (Signed)
Daily Session Note  Patient Details  Name: Dorothy Church MRN: [id number] Date of Birth: 1935/05/24 Referring Provider:  Vilinda Boehringer, MD  Encounter Date: 12/31/2014  Check In:     Session Check In - 12/31/14 1254    Check-In   Staff Present Frederich Cha RRT, RCP Respiratory Therapist;Delford Wingert Dillard Essex MS, ACSM CEP Exercise Physiologist;Carroll Enterkin RN, BSN   ER physicians immediately available to respond to emergencies [facility] immediately available ER MD   Physician(s) Jimmye Norman and Corky Downs   Medication changes reported     No   Fall or balance concerns reported    No   Warm-up and Cool-down Performed on first and last piece of equipment   VAD Patient? No   Pain Assessment   Currently in Pain? No/denies   Multiple Pain Sites No         Goals Met:  Proper associated with RPD/PD & O2 Sat Independence with exercise equipment Using PLB without cueing & demonstrates good technique Exercise tolerated well Strength training completed today  Goals Unmet:  Not Applicable  Goals Comments: Patient completed exercise prescription and all exercise goals during rehab session. The exercise was tolerated well and the patient is progressing in the program. Dorothy Church has one more session before she completes Pulmonary Rehab.  She lives at [facility] and plans to continue her exercise at the exercise facility there after graduation.   Dr. Emily Filbert is Medical Director for [facility] and [facility].

## 2015-01-03 ENCOUNTER — Encounter: Payer: Self-pay | Admitting: Respiratory Therapy

## 2015-01-03 ENCOUNTER — Encounter: Payer: Medicare Other | Admitting: *Deleted

## 2015-01-03 DIAGNOSIS — G4733 Obstructive sleep apnea (adult) (pediatric): Secondary | ICD-10-CM

## 2015-01-03 DIAGNOSIS — J449 Chronic obstructive pulmonary disease, unspecified: Secondary | ICD-10-CM | POA: Diagnosis not present

## 2015-01-03 NOTE — Progress Notes (Signed)
Pulmonary Individual Treatment Plan  Patient Details  Name: Dorothy Church MRN: [id number] Date of Birth: 11/16/1935 Referring Provider:  Dr Vilinda Boehringer  Initial Encounter Date: 10/05/2014   Visit Diagnosis: COPD MODERATE  Patient's Home Medications on Admission:  Current outpatient prescriptions:    albuterol (VENTOLIN HFA) 108 (90 BASE) MCG/ACT inhaler, Inhale 2 puffs into the lungs every 6 (six) hours as needed for wheezing., Disp: 18 g, Rfl: 5   aspirin 81 MG tablet, Take 81 mg by mouth daily., Disp: , Rfl:    budesonide-formoterol (SYMBICORT) 160-4.5 MCG/ACT inhaler, Inhale 2 puffs into the lungs daily as needed., Disp: 1 Inhaler, Rfl: 1   calcium carbonate (OS-CAL) 600 MG TABS, Take 600 mg by mouth daily., Disp: , Rfl:    denosumab (PROLIA) 60 MG/ML SOLN injection, Inject 60 mg into the skin every 6 (six) months. Administer in upper arm, thigh, or abdomen, Disp: , Rfl:    esomeprazole (NEXIUM) 40 MG capsule, Take 40 mg by mouth daily before breakfast., Disp: , Rfl:    ipratropium-albuterol (DUONEB) 0.5-2.5 (3) MG/3ML SOLN, Take 3 mLs by nebulization every 8 (eight) hours as needed. 1 ampule 3 times a day for 3 days., Disp: 360 mL, Rfl: 3   levothyroxine (SYNTHROID, LEVOTHROID) 50 MCG tablet, Take 1 tablet (50 mcg  total) by mouth daily  before breakfast., Disp: 30 tablet, Rfl: 3   Multiple Vitamin (MULTIVITAMIN) tablet, Take 1 tablet by mouth daily., Disp: , Rfl:    PARoxetine (PAXIL) 30 MG tablet, Take 1 tablet by mouth  every morning, Disp: 90 tablet, Rfl: 1   simvastatin (ZOCOR) 40 MG tablet, Take 1 tablet (40 mg total) by mouth daily at 6 PM. NEED TO MAKE APPOINTMENT FOR ANNUAL PHYSICAL, Disp: 90 tablet, Rfl: 0   Tiotropium Bromide Monohydrate (SPIRIVA RESPIMAT) 2.5 MCG/ACT AERS, Inhale 2 puffs into the lungs daily. Gargle and rinse after each use., Disp: 3 Inhaler, Rfl: 2  Past Medical History: Past Medical History  Diagnosis Date   Intrinsic asthma, unspecified     Aortic valve disorders    Obstructive sleep apnea (adult) (pediatric)    Vitamin D deficiency    Barrett's esophagus    Impaired fasting glucose    COPD (chronic obstructive pulmonary disease) (HCC)    Syncope and collapse    Hypothyroidism    Osteoporosis    Pure hypercholesterolemia    Arthritis     Tobacco Use: History  Smoking status   Never Smoker   Smokeless tobacco   Never Used    Labs: Recent Review Flowsheet Data    Labs for ITP Cardiac and Pulmonary Rehab Latest Ref Rng 07/21/2012 04/23/2013 09/02/2014   Cholestrol 0 - 200 mg/dL - 149 175   LDLCALC 0 - 99 mg/dL 72 78 93   HDL >39.00 mg/dL 65 55.50 63.80   Trlycerides 0.0 - 149.0 mg/dL 54 77.0 89.0       ADL UCSD:     ADL UCSD      10/05/14 1120 11/19/14 1531 12/29/14 1130   ADL UCSD   ADL Phase  Mid Exit   SOB Score total 42 61 20   Rest 0 1 1   Walk _0 Stairs _1 Bath _2 Dress 3 1 0   Shop _3 Pulmonary Function Assessment:     Pulmonary Function Assessment - 10/05/14 1130    Pulmonary Function Tests  RV% 143 %   DLCO% 66 %   Initial Spirometry Results   FVC% 57 %   FEV1% 39 %   FEV1/FVC Ratio 51   Post Bronchodilator Spirometry Results   FVC% 75 %   FEV1% 62 %   FEV1/FVC Ratio 62   Breath   Bilateral Breath Sounds Clear;Decreased      Exercise Target Goals:    Exercise Program Goal: Individual exercise prescription set with THRR, safety & activity barriers. Participant demonstrates ability to understand and report RPE using BORG scale, to self-measure pulse accurately, and to acknowledge the importance of the exercise prescription.  Exercise Prescription Goal: Starting with aerobic activity 30 plus minutes a day, 3 days per week for initial exercise prescription. Provide home exercise prescription and guidelines that participant acknowledges understanding prior to discharge.  Activity Barriers & Risk Stratification:     Activity Barriers &  Risk Stratification - 10/05/14 1120    Activity Barriers & Risk Stratification   Activity Barriers Shortness of Breath   Risk Stratification Low      6 Minute Walk:     6 Minute Walk      10/05/14 1120 10/07/14 0854 11/19/14 1238   6 Minute Walk   Phase Initial Initial Mid Program   Distance 1140 feet 1140 feet 1440 feet   Walk Time 6 minutes 6 minutes 6 minutes   Resting HR 80 bpm 80 bpm 89 bpm   Resting BP 110/60 mmHg 110/60 mmHg 114/62 mmHg   Max Ex. HR 114 bpm 114 bpm 130 bpm   Max Ex. BP 124/68 mmHg 124/68 mmHg 150/68 mmHg   RPE _0 Perceived Dyspnea  _1 Symptoms  No No     12/29/14 1207       6 Minute Walk   Phase Discharge     Distance 1420 feet     Distance % Change 25 %     Walk Time 6 minutes     Resting HR 89 bpm     Resting BP 104/62 mmHg     Max Ex. HR 117 bpm     Max Ex. BP 136/68 mmHg     RPE 13     Perceived Dyspnea  2     Symptoms No        Initial Exercise Prescription:     Initial Exercise Prescription - 10/07/14 0800    Date of Initial Exercise Prescription   Date 10/05/14   Treadmill   MPH 1.5   Grade 0   Minutes 10   Recumbant Bike   Level 2   RPM 40   Watts 20   Minutes 10   NuStep   Level 2   Watts 40   Minutes 10   Arm Ergometer   Level 1   Watts 10   Minutes 10   REL-XR   Level 2   Watts 40   Minutes 10   Prescription Details   Frequency (times per week) 3   Duration Progress to 30 minutes of continuous aerobic without signs/symptoms of physical distress   Intensity   THRR REST +  30   Ratings of Perceived Exertion 11-15   Perceived Dyspnea 2-4   Progression Continue progressive overload as per policy without signs/symptoms or physical distress.   Resistance Training   Training Prescription Yes   Weight 2   Reps 10-15      Exercise Prescription Changes:     Exercise  Prescription Changes      10/11/14 1400 10/15/14 1100 10/18/14 1200 10/20/14 1200 10/22/14 1200   Exercise Review   Progression     Yes Yes   Response to Exercise   Blood Pressure (Admit) 100/64 mmHg 100/64 mmHg 100/64 mmHg     Blood Pressure (Exercise) 122/62 mmHg 122/62 mmHg 122/62 mmHg     Blood Pressure (Exit) 116/72 mmHg 116/72 mmHg 116/72 mmHg     Heart Rate (Admit) 87 bpm 87 bpm 87 bpm     Heart Rate (Exercise) 120 bpm 120 bpm 120 bpm     Heart Rate (Exit) 83 bpm 83 bpm 83 bpm     Oxygen Saturation (Admit) 94 % 94 % 94 %     Oxygen Saturation (Exercise) 95 % 95 % 95 %     Oxygen Saturation (Exit) 97 % 97 % 97 %     Rating of Perceived Exertion (Exercise) _0 Perceived Dyspnea (Exercise) _1 Duration    Progress to 50 minutes of aerobic without signs/symptoms of physical distress Progress to 50 minutes of aerobic without signs/symptoms of physical distress   Intensity    THRR unchanged THRR unchanged   Progression    Continue progressive overload as per policy without signs/symptoms or physical distress. Continue progressive overload as per policy without signs/symptoms or physical distress.   Resistance Training   Training Prescription _2    Weight _3 Reps 10-12 10-12 10-12 10-12 10-12   Treadmill   MPH 1.5 1.5 1.8 1.8 1.8   Grade 0 0 0 0 0   Minutes _4 NuStep   Level _5 Watts 40 40 40 40 45   Minutes _6 Recumbant Elliptical   Level 2  BioStep 2  BioStep 2  BioStep 2  BioStep 2  BioStep   RPM _7 Watts _8 10/29/14 1200 11/01/14 1600 11/08/14 1200 11/15/14 1200 11/19/14 1200   Exercise Review   Progression _9    Response to Exercise   Blood Pressure (Admit)  100/60 mmHg      Blood Pressure (Exercise)  146/68 mmHg      Blood Pressure (Exit)  100/60 mmHg      Heart Rate (Admit)  114 bpm      Heart Rate (Exercise)  108 bpm      Heart Rate (Exit)  84 bpm      Oxygen Saturation (Admit)  96 %      Oxygen Saturation (Exercise)  95 %      Oxygen Saturation (Exit)  92 %       Rating of Perceived Exertion (Exercise)  13      Perceived Dyspnea (Exercise)  2      Comments     Reviewed individualized exercise prescription and made increases per departmental policy. Exercise increases were discussed with the patient and they were able to perform the new work loads without issue (no signs or symptoms).  Somara has also been increasing in weight lifted on the leg press almost every session. Completed the mid program 6MW test and did very well.     Duration Progress to 50 minutes of aerobic without signs/symptoms of physical distress Progress to 50 minutes  of aerobic without signs/symptoms of physical distress Progress to 50 minutes of aerobic without signs/symptoms of physical distress Progress to 50 minutes of aerobic without signs/symptoms of physical distress Progress to 50 minutes of aerobic without signs/symptoms of physical distress   Intensity _0    Progression Continue progressive overload as per policy without signs/symptoms or physical distress. Continue progressive overload as per policy without signs/symptoms or physical distress. Continue progressive overload as per policy without signs/symptoms or physical distress. Continue progressive overload as per policy without signs/symptoms or physical distress. Continue progressive overload as per policy without signs/symptoms or physical distress.   Resistance Training   Training Prescription _1    Weight _2 Reps 10-12 10-12 10-12 10-12 10-12   Treadmill   MPH 1._3 Grade 0 0 0 1 1   Minutes _4 NuStep   Level _5 Watts 30 45 45 45 45   Minutes _6 Recumbant Elliptical   Level 2  BioStep 3  BioStep 4  BioStep 4  BioStep 4  BioStep   RPM 30 30 35 35 35   Watts _7 Minutes    15 15     11/22/14 1200 11/26/14 1400 11/29/14 1100 12/03/14 1100 12/08/14 1200    Exercise Review   Progression Yes  Yes Yes Yes   Response to Exercise   Blood Pressure (Admit) 120/70 mmHg       Blood Pressure (Exercise) 134/72 mmHg       Blood Pressure (Exit) 100/68 mmHg       Heart Rate (Admit) 84 bpm       Heart Rate (Exercise) 118 bpm       Heart Rate (Exit) 85 bpm       Oxygen Saturation (Admit) 97 %       Oxygen Saturation (Exercise) 94 %       Oxygen Saturation (Exit) 96 %       Rating of Perceived Exertion (Exercise) 13       Perceived Dyspnea (Exercise) 3       Comments Reviewed individualized exercise prescription and made increases per departmental policy. Exercise increases were discussed with the patient and they were able to perform the new work loads without issue (no signs or symptoms).  Kaitlen has also been increasing in weight lifted on the leg press almost every session. Completed the mid program 6MW test and did very well.     Reviewed individualized exercise prescription and made increases per departmental policy. Exercise increases were discussed with the patient and they were able to perform the new work loads without issue (no signs or symptoms).  2.30m of Albuterol SVN was given due to SOB.  Ms. DAyubstated that she forgot to take her inhaler today. Increased Ninah's speed to 2.1 on the treadmill and she was able to complete that with no issue.   Duration Progress to 50 minutes of aerobic without signs/symptoms of physical distress   Progress to 50 minutes of aerobic without signs/symptoms of physical distress Progress to 50 minutes of aerobic without signs/symptoms of physical distress   Intensity THRR unchanged   THRR unchanged THRR unchanged   Progression Continue progressive overload as per policy without signs/symptoms or physical distress.   Continue progressive overload  as per policy without signs/symptoms or physical distress. Continue progressive overload as per policy without signs/symptoms or physical distress.   Resistance Training    Training Prescription Yes   Yes Yes   Weight _0 Reps 10-12   10-12 10-12   Treadmill   MPH 2   2 2.1   Grade _1 Minutes _2 NuStep   Level _3 Watts 50   60 60   Minutes _4 Recumbant Elliptical   Level 4  BioStep -- 5  Biostep 5  BioStep 5  BioStep   RPM 35   45 45   Watts 15  45 15 15   Minutes _5 12/20/14 1000 12/20/14 1200 12/29/14 1200 12/31/14 1500     Exercise Review   Progression Yes Yes Yes No    Response to Exercise   Blood Pressure (Admit) 98/60 mmHg 98/60 mmHg  104/60 mmHg    Blood Pressure (Exercise) 142/62 mmHg 142/62 mmHg  162/50 mmHg    Blood Pressure (Exit) 112/78 mmHg 112/78 mmHg  110/62 mmHg    Heart Rate (Admit) 60 bpm 60 bpm  105 bpm    Heart Rate (Exercise) 115 bpm 115 bpm  125 bpm    Heart Rate (Exit) 87 bpm 87 bpm  62 bpm    Oxygen Saturation (Admit) 98 % 98 %  96 %    Oxygen Saturation (Exercise) 96 % 96 %  96 %    Oxygen Saturation (Exit) 95 % 95 %  96 %    Rating of Perceived Exertion (Exercise) _6 Perceived Dyspnea (Exercise) _7 Symptoms none none none none    Comments Increased Lachrista's speed to 2.1 on the treadmill and she was able to complete that with no issue. Increased Aleese's speed to 2.1 on the treadmill and she was able to complete that with no issue. Patient is approaching graduation of the program and home exercise plans were discussed. Details of the patient's exercise prescription and what they need to do in order to continue the prescription and progress with exercise were outlined and the patient verbalized understanding. The patient plans to complete all exercise at [facility]. There is a trainer she can use at this facility and exercise classes and a gym she can use. She will be meeting with the trainer next week.  Patient is approaching graduation of the program and home exercise plans were discussed. Details of the patient's exercise prescription and what they need  to do in order to continue the prescription and progress with exercise were outlined and the patient verbalized understanding. The patient plans to complete all exercise at [facility]. There is a trainer she can use at this facility and exercise classes and a gym she can use. She will be meeting with the trainer next week.     Duration Progress to 50 minutes of aerobic without signs/symptoms of physical distress Progress to 50 minutes of aerobic without signs/symptoms of physical distress Progress to 50 minutes of aerobic without signs/symptoms of physical distress Progress to 50 minutes of aerobic without signs/symptoms of physical distress    Intensity THRR unchanged THRR unchanged THRR unchanged THRR unchanged    Progression Continue progressive overload as per policy without signs/symptoms or  physical distress. Continue progressive overload as per policy without signs/symptoms or physical distress. Continue progressive overload as per policy without signs/symptoms or physical distress. Continue progressive overload as per policy without signs/symptoms or physical distress.    Resistance Training   Training Prescription Yes Yes Yes Yes    Weight _0 Reps 10-12 10-12 10-12 10-12    Treadmill   MPH 2.1 2.1 2.1 2.1    Grade _1 Minutes _2 NuStep   Level _3 Watts 60 60 60 60    Minutes _4 Recumbant Elliptical   Level 5  BioStep 6  BioStep 6  BioStep 6  BioStep    RPM 45 50 50 50    Watts _5 Minutes _6 Home Exercise Plan   Plans to continue exercise at   [facility] (comment)  [place] (comment)  [place]       Discharge Exercise Prescription (Final Exercise Prescription Changes):     Exercise Prescription Changes - 12/31/14 1500    Exercise Review   Progression No   Response to Exercise   Blood Pressure (Admit) 104/60 mmHg   Blood Pressure (Exercise) 162/50 mmHg   Blood Pressure  (Exit) 110/62 mmHg   Heart Rate (Admit) 105 bpm   Heart Rate (Exercise) 125 bpm   Heart Rate (Exit) 62 bpm   Oxygen Saturation (Admit) 96 %   Oxygen Saturation (Exercise) 96 %   Oxygen Saturation (Exit) 96 %   Rating of Perceived Exertion (Exercise) 15   Perceived Dyspnea (Exercise) 3   Symptoms none   Comments Patient is approaching graduation of the program and home exercise plans were discussed. Details of the patient's exercise prescription and what they need to do in order to continue the prescription and progress with exercise were outlined and the patient verbalized understanding. The patient plans to complete all exercise at [facility]. There is a trainer she can use at this facility and exercise classes and a gym she can use. She will be meeting with the trainer next week.    Duration Progress to 50 minutes of aerobic without signs/symptoms of physical distress   Intensity THRR unchanged   Progression Continue progressive overload as per policy without signs/symptoms or physical distress.   Resistance Training   Training Prescription Yes   Weight 2   Reps 10-12   Treadmill   MPH 2.1   Grade 1   Minutes 15   NuStep   Level 5   Watts 60   Minutes 15   Recumbant Elliptical   Level 6  BioStep   RPM 50   Watts 15   Minutes 15   Home Exercise Plan   Plans to continue exercise at [facility] (comment)  [place]       Nutrition:  Target Goals: Understanding of nutrition guidelines, daily intake of sodium <1538m, cholesterol <2027m calories 30% from fat and 7% or less from saturated fats, daily to have 5 or more servings of fruits and vegetables.  Biometrics:     Pre Biometrics - 10/07/14 0856    Pre Biometrics   Height _7  (1.702 m)   Weight 144 lb 9.6 oz (65.59 kg)   Waist Circumference 32 inches   Hip Circumference 38 inches  Waist to Hip Ratio 0.84 %   BMI (Calculated) 22.7         Post Biometrics - 12/29/14 1214     Post  Biometrics    Height _0  (1.702 m)   Weight 146 lb (66.225 kg)   Waist Circumference 31 inches   Hip Circumference 37.75 inches   Waist to Hip Ratio 0.82 %   BMI (Calculated) 22.9      Nutrition Therapy Plan and Nutrition Goals:     Nutrition Therapy & Goals - 10/05/14 1120    Nutrition Therapy   Diet Prefers not to meet with the dietitian; She eats fruits and vegetables mainly      Nutrition Discharge: Rate Your Plate Scores:   Psychosocial: Target Goals: Acknowledge presence or absence of depression, maximize coping skills, provide positive support system. Participant is able to verbalize types and ability to use techniques and skills needed for reducing stress and depression.  Initial Review & Psychosocial Screening:     Initial Psych Review & Screening - 10/05/14 1120    Family Dynamics   Good Support System? Yes  Ms Friedland has good support from her son and friends.   Barriers   Psychosocial barriers to participate in program There are no identifiable barriers or psychosocial needs.;The patient should benefit from training in stress management and relaxation.   Screening Interventions   Interventions Encouraged to exercise      Quality of Life Scores:     Quality of Life - 12/29/14 1130    Quality of Life Scores   Health/Function Post 21.73 %   Health/Function % Change 16.56 %   Socioeconomic Post 27.5 %   Socioeconomic % Change 0 %   Psych/Spiritual Post 26.79 %   Psych/Spiritual % Change -2.13 %   Family Post 26.25 %   Family % Change -3.33 %   GLOBAL Post 24.48 %   GLOBAL % Change 6.13 %      PHQ-9:     Recent Review Flowsheet Data    Depression screen [facility] 2/9 12/29/2014 10/05/2014 09/02/2014 04/23/2013   Decreased Interest 0 0 0 0   Down, Depressed, Hopeless 0 0 0 0   PHQ - 2 Score 0 0 0 0      Psychosocial Evaluation and Intervention:     Psychosocial Evaluation - 10/27/14 1246    Psychosocial Evaluation & Interventions   Interventions Stress management  education;Relaxation education;Encouraged to exercise with the program and follow exercise prescription   Comments Counselor met with Ms. Greenfield today for initial psychosocial evaluation.  She is a 79 year old who has been diagnosed with Emphysema about a year ago.  She lives alone but reports a strong support system living in a retirement community and actively involved in her local church.  She also has sleep apnea and uses a CPAP.  She denies a history of depression or current symptoms.  She admits her mood is typically positive/optimistic but she does have a daughter with some mental health issues and Ms. D's own health issues that cause some stress on occasion.  She has goals to improve her overall health, increase her stamina and strength and to learn to exercise more consistently.     Continued Psychosocial Services Needed Yes  Due to Ms. Douse's current stressors, she will benefit from the psychoeducational components of this program, particularly stress management and depression.       Psychosocial Re-Evaluation:     Psychosocial Re-Evaluation  12/22/14 1253           Psychosocial Re-Evaluation   Interventions Stress management education;Relaxation education       Comments Follow up with Ms. Hanken today reporting feeling stronger since coming into this program, especially in her legs.  She is experiencing less pain and discomfort in her joints.  She continues to have a positive mood and outlook on life.  She reports the educational components of this program have been helpful, as she attended the Stress Management program today, and stated she "wish she had known this information earlier in life."  Ms. Weis is recognizing the benefit of consistent exercise and isworking hard to accomplish her goals.           Education: Education Goals: Education classes will be provided on a weekly basis, covering required topics. Participant will state understanding/return demonstration of  topics presented.  Learning Barriers/Preferences:     Learning Barriers/Preferences - 10/05/14 1130    Learning Barriers/Preferences   Learning Barriers None   Learning Preferences Group Instruction;Individual Instruction;Pictoral;Skilled Demonstration;Verbal Instruction;Video;Written Material      Education Topics: Initial Evaluation Education: - Verbal, written and demonstration of respiratory meds, RPE/PD scales, oximetry and breathing techniques. Instruction on use of nebulizers and MDIs: cleaning and proper use, rinsing mouth with steroid doses and importance of monitoring MDI activations.          Pulmonary Rehab from 12/29/2014 in [place]   Date  10/05/14   Educator  LB   Instruction Review Code  2- meets goals/outcomes      General Nutrition Guidelines/Fats and Fiber: -Group instruction provided by verbal, written material, models and posters to present the general guidelines for heart healthy nutrition. Gives an explanation and review of dietary fats and fiber.      Pulmonary Rehab from 12/29/2014 in [place]   Date  11/29/14   Educator  P. [place]   Instruction Review Code  2- meets goals/outcomes      Controlling Sodium/Reading Food Labels: -Group verbal and written material supporting the discussion of sodium use in heart healthy nutrition. Review and explanation with models, verbal and written materials for utilization of the food label.      Pulmonary Rehab from 12/29/2014 in [place]   Date  10/11/14   Educator  C. Joneen Caraway, RD   Instruction Review Code  2- meets goals/outcomes      Exercise Physiology & Risk Factors: - Group verbal and written instruction with models to review the exercise physiology of the cardiovascular system and associated critical values. Details cardiovascular disease risk factors and the goals associated with each risk  factor.      Pulmonary Rehab from 12/29/2014 in [place]   Date  10/27/14   Educator  SW   Instruction Review Code  2- meets goals/outcomes      Aerobic Exercise & Resistance Training: - Gives group verbal and written discussion on the health impact of inactivity. On the components of aerobic and resistive training programs and the benefits of this training and how to safely progress through these programs.   Flexibility, Balance, General Exercise Guidelines: - Provides group verbal and written instruction on the benefits of flexibility and balance training programs. Provides general exercise guidelines with specific guidelines to those with heart or lung disease. Demonstration and skill practice provided.      Pulmonary Rehab from 12/29/2014 in [place]  [facility]   Date  12/29/14   Educator  SW   Instruction Review Code  2- meets goals/outcomes      Stress Management: - Provides group verbal and written instruction about the health risks of elevated stress, cause of high stress, and healthy ways to reduce stress.      Pulmonary Rehab from 12/29/2014 in [place]   Date  12/22/14   Educator  [facility]   Instruction Review Code  2- meets goals/outcomes      Depression: - Provides group verbal and written instruction on the correlation between heart/lung disease and depressed mood, treatment options, and the stigmas associated with seeking treatment.   Exercise & Equipment Safety: - Individual verbal instruction and demonstration of equipment use and safety with use of the equipment.      Pulmonary Rehab from 12/29/2014 in [place]   Date  11/22/14   Educator  C. [place],RN   Instruction Review Code  2- meets goals/outcomes      Infection Prevention: - Provides verbal and written material to individual with discussion of infection control  including proper hand washing and proper equipment cleaning during exercise session.      Pulmonary Rehab from 12/29/2014 in [place]   Date  10/11/14   Educator  LB   Instruction Review Code  2- meets goals/outcomes      Falls Prevention: - Provides verbal and written material to individual with discussion of falls prevention and safety.      Pulmonary Rehab from 12/29/2014 in [place]   Date  10/05/14   Educator  LB   Instruction Review Code  2- meets goals/outcomes      Diabetes: - Individual verbal and written instruction to review signs/symptoms of diabetes, desired ranges of glucose level fasting, after meals and with exercise. Advice that pre and post exercise glucose checks will be done for 3 sessions at entry of program.      Pulmonary Rehab from 12/29/2014 in [place]   Date  10/22/14   Educator  CE   Instruction Review Code  2- meets goals/outcomes      Chronic Lung Diseases: - Group verbal and written instruction to review new updates, new respiratory medications, new advancements in procedures and treatments. Provide informative websites and "800" numbers of self-education.      Pulmonary Rehab from 12/29/2014 in [place]   Date  12/27/14   Educator  L. Owens Shark, RT   Instruction Review Code  2- meets goals/outcomes      Lung Procedures: - Group verbal and written instruction to describe testing methods done to diagnose lung disease. Review the outcome of test results. Describe the treatment choices: Pulmonary Function Tests, ABGs and oximetry.      Pulmonary Rehab from 12/29/2014 in [place]   Date  12/24/14   Educator  Frederich Cha, RT   Instruction Review Code  2- meets goals/outcomes      Energy Conservation: - Provide group verbal and written instruction for methods  to conserve energy, plan and organize activities. Instruct on pacing techniques, use of adaptive equipment and posture/positioning to relieve shortness of breath.   Triggers: - Group verbal and written instruction to review types of environmental controls: home humidity, furnaces, filters, dust mite/pet prevention, HEPA vacuums. To discuss weather changes, air quality and  the benefits of nasal washing.      Pulmonary Rehab from 12/29/2014 in [place]   Date  12/06/14   Educator  L. Owens Shark, RT   Instruction Review Code  2- meets goals/outcomes      Exacerbations: - Group verbal and written instruction to provide: warning signs, infection symptoms, calling MD promptly, preventive modes, and value of vaccinations. Review: effective airway clearance, coughing and/or vibration techniques. Create an Sports administrator.   Oxygen: - Individual and group verbal and written instruction on oxygen therapy. Includes supplement oxygen, available portable oxygen systems, continuous and intermittent flow rates, oxygen safety, concentrators, and Medicare reimbursement for oxygen.   Respiratory Medications: - Group verbal and written instruction to review medications for lung disease. Drug class, frequency, complications, importance of spacers, rinsing mouth after steroid MDI's, and proper cleaning methods for nebulizers.      Pulmonary Rehab from 12/29/2014 in [place]   Date  10/05/14   Educator  LB   Instruction Review Code  2- meets goals/outcomes      AED/CPR: - Group verbal and written instruction with the use of models to demonstrate the basic use of the AED with the basic ABC's of resuscitation.   Breathing Retraining: - Provides individuals verbal and written instruction on purpose, frequency, and proper technique of diaphragmatic breathing and pursed-lipped breathing. Applies individual practice skills.      Pulmonary  Rehab from 12/29/2014 in [place]   Date  10/05/14   Educator  LB   Instruction Review Code  2- meets goals/outcomes      Anatomy and Physiology of the Lungs: - Group verbal and written instruction with the use of models to provide basic lung anatomy and physiology related to function, structure and complications of lung disease.      Pulmonary Rehab from 12/29/2014 in [place]   Date  11/05/14   Educator  [place]   Instruction Review Code  2- meets goals/outcomes      Heart Failure: - Group verbal and written instruction on the basics of heart failure: signs/symptoms, treatments, explanation of ejection fraction, enlarged heart and cardiomyopathy.      Pulmonary Rehab from 12/29/2014 in [place]   Date  12/10/14   Educator  [place]   Instruction Review Code  2- meets goals/outcomes      Sleep Apnea: - Individual verbal and written instruction to review Obstructive Sleep Apnea. Review of risk factors, methods for diagnosing and types of masks and machines for OSA.   Anxiety: - Provides group, verbal and written instruction on the correlation between heart/lung disease and anxiety, treatment options, and management of anxiety.      Pulmonary Rehab from 12/29/2014 in [place]   Date  10/13/14   Educator  [facility]   Instruction Review Code  2- Meets goals/outcomes      Relaxation: - Provides group, verbal and written instruction about the benefits of relaxation for patients with heart/lung disease. Also provides patients with examples of relaxation techniques.   Knowledge Questionnaire Score:     Knowledge Questionnaire Score - 12/29/14 1130    Knowledge Questionnaire Score   Post Score -1      Personal Goals and Risk Factors at Admission:     Personal Goals and Risk Factors at Admission - 10/05/14 1120    Personal Goals and  Risk Factors on  Admission    Weight Management No   Increase Aerobic Exercise and Physical Activity Yes   Intervention While in program, learn and follow the exercise prescription taught. Start at a low level workload and increase workload after able to maintain previous level for 30 minutes. Increase time before increasing intensity.  Ms Montecalvo lives at [facility] and can use their gym .   Understand more about Heart/Pulmonary Disease. Yes   Intervention While in program utilize professionals for any questions, and attend the education sessions. Great websites to use are www.americanheart.org or www.lung.org for reliable information.  Ms Ventress would be interested in the education on COPD and other education for daily living management.   Improve shortness of breath with ADL's Yes   Intervention While in program, learn and follow the exercise prescription taught. Start at a low level workload and increase workload ad advised by the exercise physiologist. Increase time before increasing intensity.   Develop more efficient breathing techniques such as purse lipped breathing and diaphragmatic breathing; and practicing self-pacing with activity Yes   Intervention While in program, learn and utilize the specific breathing techniques taught to you. Continue to practice and use the techniques as needed.   Increase knowledge of respiratory medications and ability to use respiratory devices properly.  Yes   Intervention While in program, learn to administer MDI, nebulizer, and spacer properly.;Learn to take respiratory medicine as ordered.;While in program, learn to Clean MDI, nebulizers, and spacers properly.  Ms Courtney uses Spiriva, Ventolin MDI and SVN, Symbicort, and a spacer.  she is also on CPAP - full face mask; [employer].   Lipids Yes      Personal Goals and Risk Factors Review:      Goals and Risk Factor Review      10/15/14 1400 10/20/14 1231 10/29/14 1345 11/03/14 1130 11/05/14 1542    Increase Aerobic Exercise and Physical Activity   Goals Progress/Improvement seen  Yes   Yes    Comments Kairi is very happy with her progress so far and has already made several increases on the equipment. Her time on the equipment has been lengthened and she can almost exercise for the enitre class already. She is very eager to continue to make improvements.    Ms Friend increased her leg press weight today to 55lbs. She is so motivaed to work hard at her exercise goals.    Understand more about Heart/Pulmonary Disease   Goals Progress/Improvement seen   Yes   Yes   Comments  She has only had two education classes so far (diet and relaxation), but she enjoyed these sessions and both had good reminders of topics she already knew.    Education on [employer] was given today.  Ms. Hession was also provided with a few websites to vist to provide more education on heart and lungs.   Improve shortness of breath with ADL's   Goals Progress/Improvement seen   No      Comments  She has not noticed a change in her SOB, the exercise equipment is still challenging for her, but she is making increases and exercising for longer periods. She has not noticed any change with activities she does at home. The heat may be a contributor to her still feeling short of breath with outdoor and household tasks.       Increase knowledge of respiratory medications   Goals Progress/Improvement seen    Yes     Comments   Ms. Paar was given a  spacer to use with her MDI at home.  She was instructed on how to use it and she was impressed with the whistle feature on this device.       11/15/14 1130 11/23/14 0858 12/01/14 1130 12/17/14 1406 12/20/14 1130   Increase Aerobic Exercise and Physical Activity   Goals Progress/Improvement seen  Yes Yes   Yes   Comments Ms Bulls has increased her leg press weight to 60lbs.  She has had several increases in her exercise goals, including a 1% grade on the TM. Ms Keir increased her leg  press weight to 70lbs times 20 reps. She also increased her mid 50md by 3067f - Minimal Importance Difference for COPD is 98.8f22f  Ms DudOcheltreecreased her leg press to 100lbs x 18reps   Improve shortness of breath with ADL's   Goals Progress/Improvement seen     Yes    Comments  Ms DudDiroccos noticed an improvement in her walking distance with less shortness of breath.  Ms. DudApplins noticed that she is having some drainage and that she is a little more short of breath than usual.  We talked about different Nasal spray, medications and steam to help with this.    Breathing Techniques   Goals Progress/Improvement seen   Yes  Yes    Comments  Ms DudJacobsones PLB with her walking from the parking lot and walking her dog; plus with her exercise in [facility] - very helpful breathing technique.  Ms. DudKniskernows proper technique when using PLB while walking on the TM.     Increase knowledge of respiratory medications   Goals Progress/Improvement seen    Yes     Comments   Educated Ms DudDiguglielmo her Duoneb and Symbicort. We discussed the similarities of Atrovent with Spiriva and Pulmicort with the steroid in her Symbicort, so she could possibly use a nebulizer during her insurance "donut whole".       12/22/14 1130 12/22/14 1221 12/29/14 1130       Increase Aerobic Exercise and Physical Activity   Goals Progress/Improvement seen   Yes Yes     Comments  Gaining good strength with program, also helping that she is participating in the [employer] as well. Ms DudNorbecks increased her exercise goals throughout the program. She has also participated in the LegAudraind increased from 40lbs to 115lbs x 18 to 20reps. Her 6mw73mncreased by 280ft53fMinimal Importance Difference for COPD is 98.8ft. 23f [facility]   Goals Progress/Improvement seen  Yes  Yes     Comments Educated Ms DudleySchmuckr PFT and how it relates  to her COPD and the structure of her lungs.  Ms DudleyBudgeded education regularly and states it has been informative for her Understanding of COPD and exercise.     Improve shortness of breath with ADL's   Goals Progress/Improvement seen    Yes     Comments   Ms Gossard's SOB Questionnaire improved by 22 points, Minimal Importance Difference is 5 points. Her PD's have been 2-3 score with exercise.     Breathing Techniques   Goals Progress/Improvement seen    Yes     Comments   Ms DudleyLevelsood technique with PLB and states it is helpful for activities and exercise in controlling her shortness of breath.     Increase knowledge of  respiratory medications   Goals Progress/Improvement seen    Yes     Comments   Ms Graber has a good understanding of her MDIs and CPAP.         Personal Goals Discharge (Final Personal Goals and Risk Factors Review):      Goals and Risk Factor Review - 12/29/14 1130    Increase Aerobic Exercise and Physical Activity   Goals Progress/Improvement seen  Yes   Comments Ms Polsky has increased her exercise goals throughout the program. She has also participated in the [place] and increased from 40lbs to 115lbs x 18 to 20reps. Her 58md increased by 2830f - Minimal Importance Difference for COPD is 98.52f352fShe [facility] Understand more about Heart/Pulmonary Disease   Goals Progress/Improvement seen  Yes   Comments Ms DudWhidbeetended education regularly and states it has been informative for her Understanding of COPD and exercise.   Improve shortness of breath with ADL's   Goals Progress/Improvement seen  Yes   Comments Ms Dunkleberger's SOB Questionnaire improved by 22 points, Minimal Importance Difference is 5 points. Her PD's have been 2-3 score with exercise.   Breathing Techniques   Goals Progress/Improvement seen  Yes   Comments Ms DudHilgemans good technique with PLB and states it is helpful for activities and  exercise in controlling her shortness of breath.   Increase knowledge of respiratory medications   Goals Progress/Improvement seen  Yes   Comments Ms DudLaramees a good understanding of her MDIs and CPAP.       Comments: Ms DudDillingll be graduating from Lun[facility] will [facility] for the opportunity to work with your patient, Ms RosLesleyann Fichtern [facility]

## 2015-01-03 NOTE — Progress Notes (Signed)
Daily Session Note  Patient Details  Name: Verlia Kaney MRN: [id number] Date of Birth: 03/19/1935 Referring Provider:  Vilinda Boehringer, MD  Encounter Date: 01/03/2015  Check In:     Session Check In - 01/03/15 1234    Check-In   Staff Present Carson Myrtle BS, RRT, Respiratory Therapist;Carroll Enterkin RN, BSN;Steven Way BS, ACSM EP-C, Exercise Physiologist   ER physicians immediately available to respond to emergencies [facility] immediately available ER MD   Physician(s)  Drs. Lord and [employer]   Medication changes reported     No   Fall or balance concerns reported    No   Warm-up and Cool-down Performed on first and last piece of equipment   VAD Patient? No   Pain Assessment   Currently in Pain? No/denies         Goals Met:  Independence with exercise equipment Exercise tolerated well Strength training completed today  Goals Unmet:  Not Applicable  Goals Comments:    Dr. Emily Filbert is Medical Director for [facility] and [facility].

## 2015-01-04 ENCOUNTER — Encounter: Payer: Self-pay | Admitting: Family Medicine

## 2015-01-04 DIAGNOSIS — H40001 Preglaucoma, unspecified, right eye: Secondary | ICD-10-CM | POA: Diagnosis not present

## 2015-01-25 ENCOUNTER — Ambulatory Visit (INDEPENDENT_AMBULATORY_CARE_PROVIDER_SITE_OTHER): Payer: Medicare Other | Admitting: Family Medicine

## 2015-01-25 ENCOUNTER — Encounter: Payer: Self-pay | Admitting: Family Medicine

## 2015-01-25 VITALS — BP 116/62 | HR 89 | Temp 97.9°F | Wt 147.5 lb

## 2015-01-25 DIAGNOSIS — M25561 Pain in right knee: Secondary | ICD-10-CM

## 2015-01-25 MED ORDER — SIMVASTATIN 40 MG PO TABS: 40.0000 mg | ORAL_TABLET | Freq: Every day | ORAL | Status: DC

## 2015-01-25 MED ORDER — LEVOTHYROXINE SODIUM 50 MCG PO TABS: ORAL_TABLET | ORAL | Status: DC

## 2015-01-25 NOTE — Progress Notes (Signed)
SUBJECTIVE: Dorothy Church is a 79 y.o. female who sustained a right knee injury 2 week(s) ago. Mechanism of injury: noticed pain shortly after finishing exercises with pulmonary rehab. Immediate symptoms: immediate pain, no deformity was noted by the patient. Symptoms have been constant since that time. Prior history of related problems: no prior problems with this area in the past.  Current Outpatient Prescriptions on File Prior to Visit  Medication Sig Dispense Refill  . albuterol (VENTOLIN HFA) 108 (90 BASE) MCG/ACT inhaler Inhale 2 puffs into the lungs every 6 (six) hours as needed for wheezing. 18 g 5  . aspirin 81 MG tablet Take 81 mg by mouth daily.    . budesonide-formoterol (SYMBICORT) 160-4.5 MCG/ACT inhaler Inhale 2 puffs into the lungs daily as needed. 1 Inhaler 1  . calcium carbonate (OS-CAL) 600 MG TABS Take 600 mg by mouth daily.    Marland Kitchen denosumab (PROLIA) 60 MG/ML SOLN injection Inject 60 mg into the skin every 6 (six) months. Administer in upper arm, thigh, or abdomen    . esomeprazole (NEXIUM) 40 MG capsule Take 40 mg by mouth daily before breakfast.    . ipratropium-albuterol (DUONEB) 0.5-2.5 (3) MG/3ML SOLN Take 3 mLs by nebulization every 8 (eight) hours as needed. 1 ampule 3 times a day for 3 days. 360 mL 3  . Multiple Vitamin (MULTIVITAMIN) tablet Take 1 tablet by mouth daily.    Marland Kitchen PARoxetine (PAXIL) 30 MG tablet Take 1 tablet by mouth  every morning 90 tablet 1  . Tiotropium Bromide Monohydrate (SPIRIVA RESPIMAT) 2.5 MCG/ACT AERS Inhale 2 puffs into the lungs daily. Gargle and rinse after each use. 3 Inhaler 2   No current facility-administered medications on file prior to visit.    Allergies  Allergen Reactions  . Amoxicillin Other (See Comments)    Pencillins  . Cefzil [Cefprozil]   . Cephalexin   . Propoxyphene     Other reaction(s): Unknown  . Relafen [Nabumetone]   . Talwin [Pentazocine]     Past Medical History  Diagnosis Date  . Intrinsic asthma,  unspecified   . Aortic valve disorders   . Obstructive sleep apnea (adult) (pediatric)   . Vitamin D deficiency   . Barrett's esophagus   . Impaired fasting glucose   . COPD (chronic obstructive pulmonary disease) ([place])   . Syncope and collapse   . Hypothyroidism   . Osteoporosis   . Pure hypercholesterolemia   . Arthritis     Past Surgical History  Procedure Laterality Date  . Replacement unicondylar joint knee    . Hip surgery    . Colonoscopy    . Eye surgery    . Prosthesis eye implant    . Tonsillectomy    . Colonoscopy with propofol N/A 12/14/2014    Procedure: COLONOSCOPY WITH PROPOFOL;  Surgeon: Lollie Sails, MD;  Location: [facility] ENDOSCOPY;  Service: Endoscopy;  Laterality: N/A;  . Esophagogastroduodenoscopy (egd) with propofol N/A 12/14/2014    Procedure: ESOPHAGOGASTRODUODENOSCOPY (EGD) WITH PROPOFOL;  Surgeon: Lollie Sails, MD;  Location: [facility] ENDOSCOPY;  Service: Endoscopy;  Laterality: N/A;    Family History  Problem Relation Age of Onset  . Heart disease Father   . Bipolar disorder Brother     Social History   Social History  . Marital Status: Single    Spouse Name: N/A  . Number of Children: N/A  . Years of Education: N/A   Occupational History  . Not on file.   Social History Main Topics  .  Smoking status: Never Smoker   . Smokeless tobacco: Never Used  . Alcohol Use: Yes     Comment: occas. wine  . Drug Use: No  . Sexual Activity: Not on file   Other Topics Concern  . Not on file   Social History Narrative   Lived in [place], moved to [place] two years ago.   Divorced.      Has a living will.   Would desire CPR, would not desire extraordinary measures.         The PMH, PSH, Social History, Family History, Medications, and allergies have been reviewed in [facility], and have been updated if relevant.  OBJECTIVE: BP 116/62 mmHg  Pulse 89  Temp(Src) 97.9 F (36.6 C) (Oral)  Wt 147 lb 8 oz (66.906 kg)  SpO2  97%  Vital signs as noted above. Appearance: alert, well appearing, and in no distress, oriented to person, place, and time and normal appearing weight. Knee exam: antalgic gait, reduced range of motion, positive pivot-shift, negative Apley's sign, normal ipsilateral hip exam. X-ray: not indicated.  ASSESSMENT: Knee ligament injury  PLAN: rest the injured area as much as practical, apply ice packs, elevate the injured limb, compressive bandage, given exercises to do from sports med advisor. Call or return to clinic prn if these symptoms worsen or fail to improve as anticipated.  See orders for this visit as documented in the electronic medical record.

## 2015-01-25 NOTE — Progress Notes (Signed)
Pre visit review using our clinic review tool, if applicable. No additional management support is needed unless otherwise documented below in the visit note. 

## 2015-02-03 DIAGNOSIS — K2270 Barrett's esophagus without dysplasia: Secondary | ICD-10-CM | POA: Diagnosis not present

## 2015-02-03 DIAGNOSIS — D369 Benign neoplasm, unspecified site: Secondary | ICD-10-CM | POA: Diagnosis not present

## 2015-02-08 ENCOUNTER — Encounter: Payer: Self-pay | Admitting: Internal Medicine

## 2015-02-08 ENCOUNTER — Telehealth: Payer: Self-pay | Admitting: *Deleted

## 2015-02-08 ENCOUNTER — Ambulatory Visit (INDEPENDENT_AMBULATORY_CARE_PROVIDER_SITE_OTHER): Payer: Medicare Other | Admitting: Internal Medicine

## 2015-02-08 VITALS — BP 124/68 | HR 88 | Ht 66.5 in | Wt 142.8 lb

## 2015-02-08 DIAGNOSIS — J45909 Unspecified asthma, uncomplicated: Secondary | ICD-10-CM

## 2015-02-08 DIAGNOSIS — J449 Chronic obstructive pulmonary disease, unspecified: Secondary | ICD-10-CM

## 2015-02-08 MED ORDER — UMECLIDINIUM BROMIDE 62.5 MCG/INH IN AEPB: 1.0000 | INHALATION_SPRAY | Freq: Every day | RESPIRATORY_TRACT | Status: DC

## 2015-02-08 MED ORDER — FLUTICASONE FUROATE-VILANTEROL 200-25 MCG/INH IN AEPB: 1.0000 | INHALATION_SPRAY | Freq: Every day | RESPIRATORY_TRACT | Status: DC

## 2015-02-08 NOTE — Assessment & Plan Note (Signed)
Patient with prolonged asthma and is a never smoker with no significant exposure to 2nd hand smoke. PFTs with moderate obstruction, with significant reversibility.  I believe with her prolonged asthma, she is most likely starting to have more fixed airway (which can be seen in prolonged asthmatic), symptoms mimic those of COPD, but pathophysiology is different for the 2 entities.  Given her symptoms and PFTs findings, will continue to manage as an Asthma-COPD combination.  Plan: - stop Advair and Spiriva due to financial reasons - start Breo (200/25) and Incruse (62.28mcg), 1 puff daily of each inhaler, gargle and rinse after each use - patient assistance paperwork for Breo and Incurse - cont with exercise - albuterol as needed.

## 2015-02-08 NOTE — Telephone Encounter (Signed)
Pt informed rxs sent to [facility]. Nothing further needed.

## 2015-02-08 NOTE — Addendum Note (Signed)
Addended by: Maryanna Shape A on: 02/08/2015 11:43 AM   Modules accepted: Orders

## 2015-02-08 NOTE — Telephone Encounter (Signed)
*  STAT* If patient is at the pharmacy, call can be transferred to refill team.   1. Which medications need to be refilled? (please list name of each medication and dose if known) Fluticasone Furoate-Vilanterol (BREO ELLIPTA) 200-25 MCG/INH AEPB and Umeclidinium Bromide (INCRUSE ELLIPTA) 62.5 MCG/INH AEPB   2. Which pharmacy/location (including street and city if local pharmacy) is medication to be sent to? [facility]  3. Do they need a 30 day or 90 day supply? 90 day  Please send Rx in stat as unable to use coupon because being on Medicare with Rx plan are not eligible.

## 2015-02-08 NOTE — Telephone Encounter (Signed)
Pt's insurance will not cover the Breo or Incruse. They will cover Anoro, Serevent, and Spiriva. Pt states she is leaving to go out of town and she will continue what she has been taking until she gets back at the first of the year. What you decide to change her to she will start then. Let  Me know what to send and I will send it to [place] for her. Thanks.

## 2015-02-08 NOTE — Patient Instructions (Signed)
Follow up with Dr. Stevenson Clinch in: 6 months - stop Symbicort and Spiriva - Start BreoEllipta - 200/50, 1 puff daily. -gargle and rinse after each use.  - Start Incruse -62.59mcg, 1 puff daily. -gargle and rinse after each use.  - cont with diet and exercise as tolerated.

## 2015-02-08 NOTE — Progress Notes (Signed)
[facility] Consultation      MRN# [id number] Dorothy Church 04/02/1935   CC: Chief Complaint  Patient presents with  . Follow-up    pt. states breathing has worsen due to being off of spirivaX3 mo. occ. SOB. denies cough, wheezing or chest pain/tightness.      Brief History: HPI 04/27/14 She is a pleasant 79 year old female past medical history of asthma presenting today for as optimization. Patient states that over the past 2 months asthma has been hard to control, she is also until she may have COPD. History as stated: In January patient presented to the ER with shortness of breath and cough she was given prednisone and antibiotics and nebulizers at that time and discharged home she improved after that however in March she noted to have increased shortness of breath fatigue chest tightness and wheezing, shortness of PMD on March 7 and was given 2 nebulizer treatments with improvement along with prescription for antibiotics and steroids. Currently she denies any fever or chills. Patient states she had asthma during childhood where she had frequent hospitalizations but no intubations. About 6 years ago she was told her asthma had returned due to increasing shortness of breath and some subjective chest congestion with possible upper respiratory tract infections. Patient states her breathing is trouble with certain triggers such as mold, dust, damp areas. She was told about 45 years ago she had COPD and was also placed on Symbicort at that time along with as needed albuterol. Currently patient states that she still feels fatigue with chest congestion and some wheezing however she has no recent sick contact exposure. 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 She has a history of obstructive sleep apnea diagnosed 8 years ago and she's currently on CPAP; she doesn't recall her CPAP settings. Patient states she never  smoked tobacco had any secondhand exposure. 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 Plan: nebs, repeat CT   ROV 06/17/14 Patient presents today for a follow up visit, doing well today. Back to using symbicort. Had a CT Chest since last visit. Only using rescue inhaler about 1 time a week since last visit.   Plan: - pulmonary function testing and 6 minute walk test prior to follow up - Spiriva Respimat (2.63mcg) - 2 puff once a day - Patient given a sample - if having improvement by week 2, then patient will call us and we can fill this prescription - continue with Symbicort - continue with albuterol rescue inhaler - 2puff every 3-4 hours as needed for shortness of breath\wheezing\recurrent cough - a followup visit will consider further evaluation with GI referral if patient is still symptomatic with shortness of breath and cough  ROV 08/16/14: Patient presents for follow up visit of COPD along with PFTs and 6 MWT  Did not call in refill for spiriva, but stated that the 2 week trial was helpful Using Symbicort as directed.  Patient states overall she is doing well except she has shortness of breath mainly with exertion. No cough, no wheeze, no chest tightness. Plan: - Spiriva Respimat (2.19mcg) - 2 puff once a day - Patient given a sample - continue with Symbicort - continue with albuterol rescue inhaler - 2puff every 3-4 hours as needed for shortness of breath\wheezing\recurrent cough - Referral to pulmonary rehabilitation  ROV 11/10/14 Patient presents today for a follow up visit.  Still with some dyspnea, but states that it has been improving. Currently on symbicort and spiriva.  States that she is in the insurance lapse, and her inhalers are very expensive.  Currently attending pulm rehab, attended 3 weeks so far.   ONO in 09/2014 showed that she does not require supplemental O2 at night.  Plan -continue  with Symbicort and Spiriva, albuterol as needed, exercise as tolerated.  Events since last clinic visit: She presents today for follow-up visit of her asthma/COPD. Since her last visit she has been" donut hole", and has had to stop her Spiriva. Since then, and, duration with weather changes, she has increased dyspnea on exertion. She denies any fever, chills, recent sick contacts. No sputum production or worsening cough.  Medication:   Current Outpatient Rx  Name  Route  Sig  Dispense  Refill  . albuterol (VENTOLIN HFA) 108 (90 BASE) MCG/ACT inhaler   Inhalation   Inhale 2 puffs into the lungs every 6 (six) hours as needed for wheezing.   18 g   5   . aspirin 81 MG tablet   Oral   Take 81 mg by mouth daily.         . budesonide-formoterol (SYMBICORT) 160-4.5 MCG/ACT inhaler   Inhalation   Inhale 2 puffs into the lungs daily as needed.   1 Inhaler   1   . calcium carbonate (OS-CAL) 600 MG TABS   Oral   Take 600 mg by mouth daily.         Marland Kitchen denosumab (PROLIA) 60 MG/ML SOLN injection   Subcutaneous   Inject 60 mg into the skin every 6 (six) months. Administer in upper arm, thigh, or abdomen         . esomeprazole (NEXIUM) 40 MG capsule   Oral   Take 40 mg by mouth daily before breakfast.         . ipratropium-albuterol (DUONEB) 0.5-2.5 (3) MG/3ML SOLN   Nebulization   Take 3 mLs by nebulization every 8 (eight) hours as needed. 1 ampule 3 times a day for 3 days.   360 mL   3   . levothyroxine (SYNTHROID, LEVOTHROID) 50 MCG tablet      Take 1 tablet (50 mcg  total) by mouth daily  before breakfast.   90 tablet   1   . Multiple Vitamin (MULTIVITAMIN) tablet   Oral   Take 1 tablet by mouth daily.         Marland Kitchen PARoxetine (PAXIL) 30 MG tablet      Take 1 tablet by mouth  every morning   90 tablet   1   . simvastatin (ZOCOR) 40 MG tablet   Oral   Take 1 tablet (40 mg total) by mouth daily at 6 PM. NEED TO MAKE APPOINTMENT FOR ANNUAL PHYSICAL   90 tablet    1   . Fluticasone Furoate-Vilanterol (BREO ELLIPTA) 200-25 MCG/INH AEPB   Inhalation   Inhale 1 puff into the lungs daily.   14 each   0   . Tiotropium Bromide Monohydrate (SPIRIVA RESPIMAT) 2.5 MCG/ACT AERS   Inhalation   Inhale 2 puffs into the lungs daily. Gargle and rinse after each use. Patient not taking: Reported on 02/08/2015   3 Inhaler   2   . Umeclidinium Bromide (INCRUSE ELLIPTA) 62.5 MCG/INH AEPB   Inhalation   Inhale 1 puff into the lungs daily.   7 each   0      Review of Systems  Constitutional: Negative for fever and chills.  Eyes: Negative for blurred vision.  Respiratory: Positive for shortness of breath. Negative for cough, hemoptysis and sputum production.        Increase sob with exertion  Cardiovascular: Negative for chest pain and palpitations.  Gastrointestinal: Negative for heartburn.  Genitourinary: Negative for dysuria.  Musculoskeletal: Negative for myalgias.  Skin: Negative for itching and rash.  Neurological: Negative for dizziness and headaches.  Endo/Heme/Allergies: Does not bruise/bleed easily.  Psychiatric/Behavioral: Negative for depression.      Allergies:  Amoxicillin; Cefzil; Cephalexin; Propoxyphene; Relafen; and Talwin  Physical Examination:  VS: BP 124/68 mmHg  Pulse 88  Ht 5' 6.5" (1.689 m)  Wt 142 lb 12.8 oz (64.774 kg)  BMI 22.71 kg/m2  SpO2 93%  General Appearance: No distress  HEENT: PERRLA, no ptosis, no other lesions noticed Pulmonary:normal breath sounds., diaphragmatic excursion normal.No wheezing, No rales   Cardiovascular:  Normal S1,S2.  No m/r/g.     Abdomen:Exam: Benign, Soft, non-tender, No masses  Skin:   warm, no rashes, no ecchymosis  Extremities: normal, no cyanosis, clubbing, warm with normal capillary refill.     Assessment and Plan:78 yo F with PMHx of Asthma presenting for follow up visit, still with dyspnea.  Chronic obstructive airway disease with asthma Patient with prolonged asthma and is  a never smoker with no significant exposure to 2nd hand smoke. PFTs with moderate obstruction, with significant reversibility.  I believe with her prolonged asthma, she is most likely starting to have more fixed airway (which can be seen in prolonged asthmatic), symptoms mimic those of COPD, but pathophysiology is different for the 2 entities.  Given her symptoms and PFTs findings, will continue to manage as an Asthma-COPD combination.  Plan: - stop Advair and Spiriva due to financial reasons - start Breo (200/25) and Incruse (62.16mcg), 1 puff daily of each inhaler, gargle and rinse after each use - patient assistance paperwork for Breo and Incurse - cont with exercise - albuterol as needed.       Updated Medication List Outpatient Encounter Prescriptions as of 02/08/2015  Medication Sig  . albuterol (VENTOLIN HFA) 108 (90 BASE) MCG/ACT inhaler Inhale 2 puffs into the lungs every 6 (six) hours as needed for wheezing.  Marland Kitchen aspirin 81 MG tablet Take 81 mg by mouth daily.  . budesonide-formoterol (SYMBICORT) 160-4.5 MCG/ACT inhaler Inhale 2 puffs into the lungs daily as needed.  . calcium carbonate (OS-CAL) 600 MG TABS Take 600 mg by mouth daily.  Marland Kitchen denosumab (PROLIA) 60 MG/ML SOLN injection Inject 60 mg into the skin every 6 (six) months. Administer in upper arm, thigh, or abdomen  . esomeprazole (NEXIUM) 40 MG capsule Take 40 mg by mouth daily before breakfast.  . ipratropium-albuterol (DUONEB) 0.5-2.5 (3) MG/3ML SOLN Take 3 mLs by nebulization every 8 (eight) hours as needed. 1 ampule 3 times a day for 3 days.  Marland Kitchen levothyroxine (SYNTHROID, LEVOTHROID) 50 MCG tablet Take 1 tablet (50 mcg  total) by mouth daily  before breakfast.  . Multiple Vitamin (MULTIVITAMIN) tablet Take 1 tablet by mouth daily.  Marland Kitchen PARoxetine (PAXIL) 30 MG tablet Take 1 tablet by mouth  every morning  . simvastatin (ZOCOR) 40 MG tablet Take 1 tablet (40 mg total) by mouth daily at 6 PM. NEED TO MAKE APPOINTMENT FOR  ANNUAL PHYSICAL  . Fluticasone Furoate-Vilanterol (BREO ELLIPTA) 200-25 MCG/INH AEPB Inhale 1 puff into the lungs daily.  . Tiotropium Bromide Monohydrate (SPIRIVA RESPIMAT) 2.5 MCG/ACT AERS Inhale 2 puffs  into the lungs daily. Gargle and rinse after each use. (Patient not taking: Reported on 02/08/2015)  . Umeclidinium Bromide (INCRUSE ELLIPTA) 62.5 MCG/INH AEPB Inhale 1 puff into the lungs daily.   No facility-administered encounter medications on file as of 02/08/2015.    Orders for this visit: No orders of the defined types were placed in this encounter.    Thank  you for the visitation and for allowing  [facility] to assist in the care of your patient. Our recommendations are noted above.  Please contact us if we can be of further service.  Vilinda Boehringer, MD [facility] Office Number: [phone]

## 2015-02-09 MED ORDER — UMECLIDINIUM-VILANTEROL 62.5-25 MCG/INH IN AEPB: INHALATION_SPRAY | RESPIRATORY_TRACT | Status: DC

## 2015-02-09 NOTE — Telephone Encounter (Signed)
Anoro - 1 puff daily, gargle and rinse after each use

## 2015-02-09 NOTE — Telephone Encounter (Signed)
Spoke with pt and is aware of recs below. I have sent this into [facility]. Nothing further needed

## 2015-02-24 ENCOUNTER — Ambulatory Visit (INDEPENDENT_AMBULATORY_CARE_PROVIDER_SITE_OTHER): Payer: Medicare Other | Admitting: Family Medicine

## 2015-02-24 ENCOUNTER — Encounter: Payer: Self-pay | Admitting: Family Medicine

## 2015-02-24 VITALS — BP 112/68 | HR 92 | Temp 97.3°F | Ht 66.5 in | Wt 144.8 lb

## 2015-02-24 DIAGNOSIS — Z96652 Presence of left artificial knee joint: Secondary | ICD-10-CM

## 2015-02-24 DIAGNOSIS — M1711 Unilateral primary osteoarthritis, right knee: Secondary | ICD-10-CM

## 2015-02-24 DIAGNOSIS — M25561 Pain in right knee: Secondary | ICD-10-CM

## 2015-02-24 MED ORDER — METHYLPREDNISOLONE ACETATE 40 MG/ML IJ SUSP
80.0000 mg | Freq: Once | INTRAMUSCULAR | Status: AC
  Administered 2015-02-24: 80 mg via INTRA_ARTICULAR

## 2015-02-24 MED ORDER — DICLOFENAC SODIUM 1 % TD GEL: 4.0000 g | Freq: Four times a day (QID) | TRANSDERMAL | Status: DC

## 2015-02-24 NOTE — Progress Notes (Signed)
Pre visit review using our clinic review tool, if applicable. No additional management support is needed unless otherwise documented below in the visit note. 

## 2015-02-24 NOTE — Progress Notes (Signed)
Dr. Frederico Hamman T. Bailee Metter, MD, [facility] [place], [address] Phone: [phone] Fax: [phone]  02/24/2015  Patient: Dorothy Church, MRN: [id number], DOB: 24-Apr-1935, 80 y.o.  Primary Physician:  Arnette Norris, MD   Chief Complaint  Patient presents with  . Leg Pain    Stiffens in back of both knees x 1 month   Subjective:   Dorothy Church is a 80 y.o. very pleasant female patient who presents with the following:  Pleasant patient with B posterior knee pain s/p L TKA in 1980 with limited motion in both knees presents being referred for Dr. Deborra Medina for knee pain evaluation.  Trouble with her legs pain B knees. Initially, Right then it was pain in her r knee.  Back then Dr. Deborra Medina had take some alleve - about a month ago.   If walk any length of time she will get some pain. Will sit down.  Bothers with standing for a length of time and will walk and get kind of stiff.  No locking up of the joint.   She is taking some intermittent nsAIDS.  Past Medical History, Surgical History, Social History, Family History, Problem List, Medications, and Allergies have been reviewed and updated if relevant.  Patient Active Problem List   Diagnosis Date Noted  . Chronic obstructive airway disease with asthma ([place]) 11/10/2014  . Failed hearing screening 09/02/2014  . Sting from hornet, wasp, or bee 08/12/2014  . COPD (chronic obstructive pulmonary disease) ([place]) 04/27/2014  . History of colonic polyps 12/08/2013  . Nevus 12/08/2013  . Right cervical radiculopathy 08/05/2013  . Medicare annual wellness visit, subsequent 04/23/2013  . Hypersomnia with sleep apnea, unspecified 04/23/2013  . ETD (eustachian tube dysfunction) 01/30/2013  . Left knee pain 10/07/2012  . Osteoporosis 10/07/2012  . Vitamin D deficiency   . Hypothyroidism   . SOB (shortness of breath) 08/07/2012  . Fatigue 08/07/2012  . Dizziness 08/07/2012  .  Hyperlipidemia 08/07/2012  . Obstructive sleep apnea on CPAP 08/07/2012  . Asthma 08/07/2012    Past Medical History  Diagnosis Date  . Intrinsic asthma, unspecified   . Aortic valve disorders   . Obstructive sleep apnea (adult) (pediatric)   . Vitamin D deficiency   . Barrett's esophagus   . Impaired fasting glucose   . COPD (chronic obstructive pulmonary disease) ([place])   . Syncope and collapse   . Hypothyroidism   . Osteoporosis   . Pure hypercholesterolemia   . Arthritis     Past Surgical History  Procedure Laterality Date  . Replacement unicondylar joint knee    . Hip surgery    . Colonoscopy    . Eye surgery    . Prosthesis eye implant    . Tonsillectomy    . Colonoscopy with propofol N/A 12/14/2014    Procedure: COLONOSCOPY WITH PROPOFOL;  Surgeon: Lollie Sails, MD;  [facility] END[facility]COPY;  Service: Endoscopy;  Laterality: N/A;  . Esophagogastroduodenoscopy (egd) with propofol N/A 12/14/2014    Procedure: ESOPHAGOGASTRODUODENOSCOPY (EGD) WITH PROPOFOL;  Surgeon: Lollie Sails, MD;  Location: [facility] ENDOSCOPY;  Service: Endoscopy;  Laterality: N/A;    Social History   Social History  . Marital Status: Single    Spouse Name: N/A  . Number of Children: N/A  . Years of Education: N/A   Occupational History  . Not on file.   Social History Main Topics  . Smoking status: Never Smoker   . Smokeless  tobacco: Never Used  . Alcohol Use: Yes     Comment: occas. wine  . Drug Use: No  . Sexual Activity: Not on file   Other Topics Concern  . Not on file   Social History Narrative   Lived in [place], moved to [place] two years ago.   Divorced.      Has a living will.   Would desire CPR, would not desire extraordinary measures.          Family History  Problem Relation Age of Onset  . Heart disease Father   . Bipolar disorder Brother     Allergies  Allergen Reactions  . Amoxicillin Other (See Comments)    Pencillins  . Cefzil  [Cefprozil]   . Cephalexin   . Propoxyphene     Other reaction(s): Unknown  . Relafen [Nabumetone]   . Talwin [Pentazocine]     Medication list reviewed and updated in full in [facility].  GEN: No fevers, chills. Nontoxic. Primarily MSK c/o today. MSK: Detailed in the HPI GI: tolerating PO intake without difficulty Neuro: No numbness, parasthesias, or tingling associated. Otherwise the pertinent positives of the ROS are noted above.   Objective:   BP 112/68 mmHg  Pulse 92  Temp(Src) 97.3 F (36.3 C) (Oral)  Ht 5' 6.5" (1.689 m)  Wt 144 lb 12 oz (65.658 kg)  BMI 23.02 kg/m2   GEN: WDWN, NAD, Non-toxic, Alert & Oriented x 3 HEENT: Atraumatic, Normocephalic.  Ears and Nose: No external deformity. EXTR: No clubbing/cyanosis/edema NEURO: Normal gait. Mild antalgia. PSYCH: Normally interactive. Conversant. Not depressed or anxious appearing.  Calm demeanor.    Bilateral knee exam:.  Right knee lacks 3 of extension and flexion to 110.  Knee on the left lacks 5 of extension and can flex to 95.  Minimal effusion on the right.  Mild tenderness at patellar facets on the right and patellar crepitus.  Stable MCL, LCL bilaterally.  Lachman is intact on the right.  Deep flexion causes pain bilaterally.  There is some tenderness on the medial joint line more on the left side.  There is also some posterior fullness bilaterally.  Lateral aspects of both knees are less tender.  Radiology: No results found.  Assessment and Plan:   Primary osteoarthritis of right knee  Right knee pain - Plan: methylPREDNISolone acetate (DEPO-MEDROL) injection 80 mg  H/O total knee replacement, left   I think the pain bilaterally is likely coming from her knees, secondary to arthritis.  Clinic and clinically consistent with this and some loss of motion bilaterally.  Her left-sided knee replacement is 80 years old, but it doesn't seem to bother her all that much, so I would leave well enough a known  there and try to calm this down conservatively.  Tylenol up to 3-4 times a day, oral NSAIDs as needed.  Start topical Voltaren gel.  Today, injected right knee  Knee Injection, R Patient verbally consented to procedure. Risks (including potential rare risk of infection), benefits, and alternatives explained. Sterilely prepped with Chloraprep. Ethyl cholride used for anesthesia. 8 cc Lidocaine 1% mixed with 2 mL Depo-Medrol 40 mg injected using the anteromedial approach without difficulty. No complications with procedure and tolerated well. Patient had decreased pain post-injection.   Follow-up: prn if needed - could not repeat knee injection for at least 3-4 months  New Prescriptions   DICLOFENAC SODIUM (VOLTAREN) 1 % GEL    Apply 4 g topically 4 (four) times daily.  Signed,  Maud Deed. Nickalaus Crooke, MD   Patient's Medications  New Prescriptions   DICLOFENAC SODIUM (VOLTAREN) 1 % GEL    Apply 4 g topically 4 (four) times daily.  Previous Medications   ALBUTEROL (VENTOLIN HFA) 108 (90 BASE) MCG/ACT INHALER    Inhale 2 puffs into the lungs every 6 (six) hours as needed for wheezing.   ASPIRIN 81 MG TABLET    Take 81 mg by mouth daily.   CALCIUM CARBONATE (OS-CAL) 600 MG TABS    Take 600 mg by mouth daily.   DENOSUMAB (PROLIA) 60 MG/ML SOLN INJECTION    Inject 60 mg into the skin every 6 (six) months. Administer in upper arm, thigh, or abdomen   ESOMEPRAZOLE (NEXIUM) 40 MG CAPSULE    Take 40 mg by mouth daily before breakfast.   FLUTICASONE FUROATE-VILANTEROL (BREO ELLIPTA) 200-25 MCG/INH AEPB    Inhale 1 puff into the lungs daily.   IPRATROPIUM-ALBUTEROL (DUONEB) 0.5-2.5 (3) MG/3ML SOLN    Take 3 mLs by nebulization every 8 (eight) hours as needed. 1 ampule 3 times a day for 3 days.   LEVOTHYROXINE (SYNTHROID, LEVOTHROID) 50 MCG TABLET    Take 1 tablet (50 mcg  total) by mouth daily  before breakfast.   MULTIPLE VITAMIN (MULTIVITAMIN) TABLET    Take 1 tablet by mouth daily.   PAROXETINE  (PAXIL) 30 MG TABLET    Take 1 tablet by mouth  every morning   SIMVASTATIN (ZOCOR) 40 MG TABLET    Take 1 tablet (40 mg total) by mouth daily at 6 PM. NEED TO MAKE APPOINTMENT FOR ANNUAL PHYSICAL   UMECLIDINIUM BROMIDE (INCRUSE ELLIPTA) 62.5 MCG/INH AEPB    Inhale 1 puff into the lungs daily.   UMECLIDINIUM-VILANTEROL (ANORO ELLIPTA) 62.5-25 MCG/INH AEPB    1 puff once daily, gargle and rinse afterwards  Modified Medications   No medications on file  Discontinued Medications   No medications on file

## 2015-03-01 ENCOUNTER — Telehealth: Payer: Self-pay | Admitting: Internal Medicine

## 2015-03-01 NOTE — Telephone Encounter (Signed)
Pt of informed VM response. Nothing further needed.

## 2015-03-01 NOTE — Telephone Encounter (Signed)
Pt daughter cb, [phone]

## 2015-03-01 NOTE — Telephone Encounter (Signed)
lmtcb X1 for pt  

## 2015-03-01 NOTE — Telephone Encounter (Signed)
Patient states that she has various diagnoses: Asthma, COPD, Emphysema, etc... She said that she was given the Anoro, however, the paperwork that comes with the Anoro says that if you have asthma you should not take Anoro.  Patient wants to know if it is okay for her to take the Anoro.  She has not started the medication yet.   Dr. Stevenson Clinch, please advise.

## 2015-03-01 NOTE — Telephone Encounter (Signed)
Patient has Asthma-COPD combination.  Emphysema is a form of COPD.   It is okay for her to use Anoro.   Thank you.

## 2015-03-02 DIAGNOSIS — H40001 Preglaucoma, unspecified, right eye: Secondary | ICD-10-CM | POA: Diagnosis not present

## 2015-03-04 ENCOUNTER — Telehealth: Payer: Self-pay | Admitting: *Deleted

## 2015-03-04 NOTE — Telephone Encounter (Signed)
Received fax for [facility] requesting PA for Diclofenac Gel 1%.  PA completed on CoverMyMeds.  Awaiting Response.

## 2015-03-07 NOTE — Telephone Encounter (Signed)
Prior Josem Kaufmann has been approved through 02/19/2016. [id number]

## 2015-03-09 ENCOUNTER — Other Ambulatory Visit: Payer: Self-pay | Admitting: Family Medicine

## 2015-03-09 NOTE — Telephone Encounter (Signed)
Pt has not had f/u appt since 2015. pls advise

## 2015-03-09 NOTE — Telephone Encounter (Signed)
Ok to refill one time only.  Needs 30 min follow up for further refills please.

## 2015-03-10 NOTE — Telephone Encounter (Signed)
Spoke to pt and scheduled f/u. Pt advised #[phone]  Sent to pharmacy and no additional refills granted until seen.

## 2015-03-15 ENCOUNTER — Other Ambulatory Visit: Payer: Self-pay | Admitting: Gastroenterology

## 2015-03-15 DIAGNOSIS — R1013 Epigastric pain: Secondary | ICD-10-CM

## 2015-03-25 ENCOUNTER — Ambulatory Visit: Payer: Medicare Other

## 2015-03-25 ENCOUNTER — Encounter
Admission: RE | Admit: 2015-03-25 | Discharge: 2015-03-25 | Disposition: A | Discharge status: Home / Self Care | Payer: Medicare Other | Source: Ambulatory Visit | Attending: Gastroenterology | Admitting: Gastroenterology

## 2015-03-25 ENCOUNTER — Ambulatory Visit
Admission: RE | Admit: 2015-03-25 | Discharge: 2015-03-25 | Disposition: A | Discharge status: Home / Self Care | Payer: Medicare Other | Source: Ambulatory Visit | Attending: Gastroenterology | Admitting: Gastroenterology

## 2015-03-25 DIAGNOSIS — N281 Cyst of kidney, acquired: Secondary | ICD-10-CM | POA: Diagnosis not present

## 2015-03-25 DIAGNOSIS — K8020 Calculus of gallbladder without cholecystitis without obstruction: Secondary | ICD-10-CM | POA: Diagnosis not present

## 2015-03-25 DIAGNOSIS — R1013 Epigastric pain: Secondary | ICD-10-CM | POA: Insufficient documentation

## 2015-03-25 DIAGNOSIS — K219 Gastro-esophageal reflux disease without esophagitis: Secondary | ICD-10-CM | POA: Insufficient documentation

## 2015-03-25 MED ORDER — TECHNETIUM TC 99M MEBROFENIN IV KIT
5.2250 | PACK | Freq: Once | INTRAVENOUS | Status: AC | PRN
  Administered 2015-03-25: 5.225 via INTRAVENOUS

## 2015-03-25 MED ORDER — SINCALIDE 5 MCG IJ SOLR
0.0200 ug/kg | Freq: Once | INTRAMUSCULAR | Status: AC
  Administered 2015-03-25: 1.31 ug via INTRAVENOUS
  Filled 2015-03-25: qty 5

## 2015-03-31 ENCOUNTER — Ambulatory Visit (INDEPENDENT_AMBULATORY_CARE_PROVIDER_SITE_OTHER): Payer: Medicare Other | Admitting: Primary Care

## 2015-03-31 ENCOUNTER — Encounter: Payer: Self-pay | Admitting: Primary Care

## 2015-03-31 VITALS — BP 116/70 | HR 92 | Temp 98.1°F | Ht 66.5 in | Wt 146.1 lb

## 2015-03-31 DIAGNOSIS — R05 Cough: Secondary | ICD-10-CM | POA: Diagnosis not present

## 2015-03-31 DIAGNOSIS — R059 Cough, unspecified: Secondary | ICD-10-CM

## 2015-03-31 MED ORDER — AZITHROMYCIN 250 MG PO TABS: ORAL_TABLET | ORAL | Status: DC

## 2015-03-31 NOTE — Progress Notes (Signed)
Pre visit review using our clinic review tool, if applicable. No additional management support is needed unless otherwise documented below in the visit note. 

## 2015-03-31 NOTE — Patient Instructions (Signed)
Start Azithromycin antibiotics. Take 2 tablets by mouth today, then 1 tablet daily for 4 additional days.  Cough: Try taking Robitussin or Delsym. This may be purchased over the counter.   Nasal Congestion: Try using Flonase (fluticasone) nasal spray. Instill 2 sprays in each nostril once daily.   Increase consumption of water and rest. Please call me if no improvement in 3-4 days.  It was a pleasure meeting you!

## 2015-03-31 NOTE — Progress Notes (Signed)
Subjective:    Patient ID: Dorothy Church, female    DOB: [birth year], 80 y.o.   MRN: [id number]  HPI  Dorothy Church is a 80 year old female who presents today with a chief complaint of sore throat. She also reports cough, fatigue and shortness of breath. Her symptoms have been present for the past 2.5 weeks. Her symptoms began with a cough that has progressed without improvement. Denies fevers, nausea. She's taken ibuprofen without improvement. Denies sick contacts.   Review of Systems  Constitutional: Positive for fatigue. Negative for fever and chills.  HENT: Positive for congestion, sinus pressure and sore throat.   Respiratory: Positive for cough and shortness of breath. Negative for wheezing.   Cardiovascular: Negative for chest pain.  Gastrointestinal: Negative for nausea.       Past Medical History  Diagnosis Date  . Intrinsic asthma, unspecified   . Aortic valve disorders   . Obstructive sleep apnea (adult) (pediatric)   . Vitamin D deficiency   . Barrett's esophagus   . Impaired fasting glucose   . COPD (chronic obstructive pulmonary disease) ([place])   . Syncope and collapse   . Hypothyroidism   . Osteoporosis   . Pure hypercholesterolemia   . Arthritis     Social History   Social History  . Marital Status: Single    Spouse Name: N/A  . Number of Children: N/A  . Years of Education: N/A   Occupational History  . Not on file.   Social History Main Topics  . Smoking status: Never Smoker   . Smokeless tobacco: Never Used  . Alcohol Use: Yes     Comment: occas. wine  . Drug Use: No  . Sexual Activity: Not on file   Other Topics Concern  . Not on file   Social History Narrative   Lived in [place], moved to [place] two years ago.   Divorced.      Has a living will.   Would desire CPR, would not desire extraordinary measures.          Past Surgical History  Procedure Laterality Date  . Replacement unicondylar joint knee    . Hip  surgery    . Colonoscopy    . Eye surgery    . Prosthesis eye implant    . Tonsillectomy    . Colonoscopy with propofol N/A 12/14/2014    Procedure: COLONOSCOPY WITH PROPOFOL;  Surgeon: Lollie Sails, MD;  Location: [facility] ENDOSCOPY;  Service: Endoscopy;  Laterality: N/A;  . Esophagogastroduodenoscopy (egd) with propofol N/A 12/14/2014    Procedure: ESOPHAGOGASTRODUODENOSCOPY (EGD) WITH PROPOFOL;  Surgeon: Lollie Sails, MD;  Location: [facility] ENDOSCOPY;  Service: Endoscopy;  Laterality: N/A;    Family History  Problem Relation Age of Onset  . Heart disease Father   . Bipolar disorder Brother     Allergies  Allergen Reactions  . Amoxicillin Other (See Comments)    Pencillins  . Cefzil [Cefprozil]   . Cephalexin   . Propoxyphene     Other reaction(s): Unknown  . Relafen [Nabumetone]   . Talwin [Pentazocine]     Current Outpatient Prescriptions on File Prior to Visit  Medication Sig Dispense Refill  . albuterol (VENTOLIN HFA) 108 (90 BASE) MCG/ACT inhaler Inhale 2 puffs into the lungs every 6 (six) hours as needed for wheezing. 18 g 5  . aspirin 81 MG tablet Take 81 mg by mouth daily.    . calcium carbonate (OS-CAL) 600 MG TABS Take 600  mg by mouth daily.    Marland Kitchen denosumab (PROLIA) 60 MG/ML SOLN injection Inject 60 mg into the skin every 6 (six) months. Administer in upper arm, thigh, or abdomen    . diclofenac sodium (VOLTAREN) 1 % GEL Apply 4 g topically 4 (four) times daily. 5 Tube 11  . esomeprazole (NEXIUM) 40 MG capsule Take 40 mg by mouth daily before breakfast.    . Fluticasone Furoate-Vilanterol (BREO ELLIPTA) 200-25 MCG/INH AEPB Inhale 1 puff into the lungs daily. 180 each 2  . ipratropium-albuterol (DUONEB) 0.5-2.5 (3) MG/3ML SOLN Take 3 mLs by nebulization every 8 (eight) hours as needed. 1 ampule 3 times a day for 3 days. 360 mL 3  . levothyroxine (SYNTHROID, LEVOTHROID) 50 MCG tablet Take 1 tablet (50 mcg  total) by mouth daily  before breakfast. 90 tablet 1  .  Multiple Vitamin (MULTIVITAMIN) tablet Take 1 tablet by mouth daily.    Marland Kitchen PARoxetine (PAXIL) 30 MG tablet Take 1 tablet by mouth  every morning 30 tablet 0  . simvastatin (ZOCOR) 40 MG tablet Take 1 tablet (40 mg total) by mouth daily at 6 PM. NEED TO MAKE APPOINTMENT FOR ANNUAL PHYSICAL 90 tablet 1   No current facility-administered medications on file prior to visit.    BP 116/70 mmHg  Pulse 92  Temp(Src) 98.1 F (36.7 C) (Oral)  Ht 5' 6.5" (1.689 m)  Wt 146 lb 1.9 oz (66.28 kg)  BMI 23.23 kg/m2  SpO2 95%    Objective:   Physical Exam  Constitutional: She appears well-nourished.  HENT:  Right Ear: Ear canal normal.  Left Ear: Ear canal normal.  Nose: Right sinus exhibits maxillary sinus tenderness and frontal sinus tenderness. Left sinus exhibits maxillary sinus tenderness and frontal sinus tenderness.  Mouth/Throat: Posterior oropharyngeal erythema present. No oropharyngeal exudate or posterior oropharyngeal edema.  Cardiovascular: Normal rate and regular rhythm.   Pulmonary/Chest: Effort normal. She has no decreased breath sounds. She has no wheezes. She has rhonchi in the right upper field and the left upper field.  Skin: Skin is warm and dry.          Assessment & Plan:  Cough:  Present for 2.5 weeks with sore throat, fatigue, nasal congestion.  Exam with rhonchi to upper lobes, no diminished sounds or wheezing. Appears ill but not toxic. Due to duration and examination will treat for presumed bacterial involvement. RX for Zpak sent to pharmacy. Treat with Robitussin or Delsym, fluids, rest. Return precautions provided.

## 2015-04-01 ENCOUNTER — Encounter: Payer: Self-pay | Admitting: Primary Care

## 2015-04-01 ENCOUNTER — Other Ambulatory Visit: Payer: Self-pay | Admitting: Primary Care

## 2015-04-01 DIAGNOSIS — B379 Candidiasis, unspecified: Secondary | ICD-10-CM

## 2015-04-01 DIAGNOSIS — T3695XA Adverse effect of unspecified systemic antibiotic, initial encounter: Principal | ICD-10-CM

## 2015-04-01 MED ORDER — FLUCONAZOLE 150 MG PO TABS: 150.0000 mg | ORAL_TABLET | Freq: Once | ORAL | Status: DC

## 2015-04-04 ENCOUNTER — Ambulatory Visit (INDEPENDENT_AMBULATORY_CARE_PROVIDER_SITE_OTHER)
Admission: RE | Admit: 2015-04-04 | Discharge: 2015-04-04 | Disposition: A | Discharge status: Home / Self Care | Payer: Medicare Other | Source: Ambulatory Visit | Attending: Family Medicine | Admitting: Family Medicine

## 2015-04-04 ENCOUNTER — Ambulatory Visit (INDEPENDENT_AMBULATORY_CARE_PROVIDER_SITE_OTHER): Payer: Medicare Other | Admitting: Family Medicine

## 2015-04-04 ENCOUNTER — Encounter: Payer: Self-pay | Admitting: Family Medicine

## 2015-04-04 VITALS — BP 112/70 | HR 84 | Temp 97.8°F | Wt 150.8 lb

## 2015-04-04 DIAGNOSIS — E785 Hyperlipidemia, unspecified: Secondary | ICD-10-CM

## 2015-04-04 DIAGNOSIS — M1711 Unilateral primary osteoarthritis, right knee: Secondary | ICD-10-CM

## 2015-04-04 DIAGNOSIS — M179 Osteoarthritis of knee, unspecified: Secondary | ICD-10-CM | POA: Diagnosis not present

## 2015-04-04 DIAGNOSIS — E039 Hypothyroidism, unspecified: Secondary | ICD-10-CM

## 2015-04-04 DIAGNOSIS — J45909 Unspecified asthma, uncomplicated: Secondary | ICD-10-CM | POA: Diagnosis not present

## 2015-04-04 DIAGNOSIS — J449 Chronic obstructive pulmonary disease, unspecified: Secondary | ICD-10-CM

## 2015-04-04 DIAGNOSIS — M1990 Unspecified osteoarthritis, unspecified site: Secondary | ICD-10-CM | POA: Insufficient documentation

## 2015-04-04 MED ORDER — PAROXETINE HCL 30 MG PO TABS: ORAL_TABLET | ORAL | Status: DC

## 2015-04-04 MED ORDER — LEVOTHYROXINE SODIUM 50 MCG PO TABS: ORAL_TABLET | ORAL | Status: DC

## 2015-04-04 NOTE — Progress Notes (Signed)
Subjective:   Patient ID: Dorothy Church, female    DOB: 1935-08-31, 80 y.o.   MRN: [id number]  Dorothy Church is a pleasant 80 y.o. year old female who presents to clinic today with Follow-up  on 04/04/2015  HPI:  Here to "update medication" and talk about her knee.  Hypothyroidism- current taking synthroid 50 mcg daily.  Denies any symptoms of hypo or hyperthyroidism. Lab Results  Component Value Date   TSH 1.49 09/02/2014    Osteoporosis-  She is receiving prolia, last injection 05/11/14.  Most recent DEXA from 05/20/14 did show improvement- now osteopenic.  Emphysema- followed by pulmonary.  Was last seen by [facility] on 02/08/15- note reviewed.  Saw Owens Shark last week for COPD exacerbation.  Feeling better- given zpack.  Feeling better. On Breo now with as needed proair and duoneb.  Right knee- saw Dr. Lorelei Pont last month for this.  Cortisone injection unfortunately only helped for a week.  Remote h/o LTK.  Right knee pain is starting to impact her quality of life.  Topical voltaren takes the edge off. Current Outpatient Prescriptions on File Prior to Visit  Medication Sig Dispense Refill  . albuterol (VENTOLIN HFA) 108 (90 BASE) MCG/ACT inhaler Inhale 2 puffs into the lungs every 6 (six) hours as needed for wheezing. 18 g 5  . aspirin 81 MG tablet Take 81 mg by mouth daily.    . calcium carbonate (OS-CAL) 600 MG TABS Take 600 mg by mouth daily.    Marland Kitchen denosumab (PROLIA) 60 MG/ML SOLN injection Inject 60 mg into the skin every 6 (six) months. Administer in upper arm, thigh, or abdomen    . diclofenac sodium (VOLTAREN) 1 % GEL Apply 4 g topically 4 (four) times daily. 5 Tube 11  . esomeprazole (NEXIUM) 40 MG capsule Take 40 mg by mouth daily before breakfast.    . fluconazole (DIFLUCAN) 150 MG tablet Take 1 tablet (150 mg total) by mouth once. 1 tablet 0  . Fluticasone Furoate-Vilanterol (BREO ELLIPTA) 200-25 MCG/INH AEPB Inhale 1 puff into the lungs daily. 180 each 2  .  ipratropium-albuterol (DUONEB) 0.5-2.5 (3) MG/3ML SOLN Take 3 mLs by nebulization every 8 (eight) hours as needed. 1 ampule 3 times a day for 3 days. 360 mL 3  . levothyroxine (SYNTHROID, LEVOTHROID) 50 MCG tablet Take 1 tablet (50 mcg  total) by mouth daily  before breakfast. 90 tablet 1  . Multiple Vitamin (MULTIVITAMIN) tablet Take 1 tablet by mouth daily.    Marland Kitchen PARoxetine (PAXIL) 30 MG tablet Take 1 tablet by mouth  every morning 30 tablet 0  . simvastatin (ZOCOR) 40 MG tablet Take 1 tablet (40 mg total) by mouth daily at 6 PM. NEED TO MAKE APPOINTMENT FOR ANNUAL PHYSICAL 90 tablet 1   No current facility-administered medications on file prior to visit.    Allergies  Allergen Reactions  . Amoxicillin Other (See Comments)    Pencillins  . Cefzil [Cefprozil]   . Cephalexin   . Propoxyphene     Other reaction(s): Unknown  . Relafen [Nabumetone]   . Talwin [Pentazocine]     Past Medical History  Diagnosis Date  . Intrinsic asthma, unspecified   . Aortic valve disorders   . Obstructive sleep apnea (adult) (pediatric)   . Vitamin D deficiency   . Barrett's esophagus   . Impaired fasting glucose   . COPD (chronic obstructive pulmonary disease) ([place])   . Syncope and collapse   . Hypothyroidism   . Osteoporosis   .  Pure hypercholesterolemia   . Arthritis     Past Surgical History  Procedure Laterality Date  . Replacement unicondylar joint knee    . Hip surgery    . Colonoscopy    . Eye surgery    . Prosthesis eye implant    . Tonsillectomy    . Colonoscopy with propofol N/A 12/14/2014    Procedure: COLONOSCOPY WITH PROPOFOL;  Surgeon: Lollie Sails, MD;  Location: [facility] ENDOSCOPY;  Service: Endoscopy;  Laterality: N/A;  . Esophagogastroduodenoscopy (egd) with propofol N/A 12/14/2014    Procedure: ESOPHAGOGASTRODUODENOSCOPY (EGD) WITH PROPOFOL;  Surgeon: Lollie Sails, MD;  Location: [facility] ENDOSCOPY;  Service: Endoscopy;  Laterality: N/A;    Family History  Problem  Relation Age of Onset  . Heart disease Father   . Bipolar disorder Brother     Social History   Social History  . Marital Status: Single    Spouse Name: N/A  . Number of Children: N/A  . Years of Education: N/A   Occupational History  . Not on file.   Social History Main Topics  . Smoking status: Never Smoker   . Smokeless tobacco: Never Used  . Alcohol Use: 0.0 oz/week    0 Standard drinks or equivalent per week     Comment: occas. wine  . Drug Use: No  . Sexual Activity: Not on file   Other Topics Concern  . Not on file   Social History Narrative   Lived in [place], moved to [place] two years ago.   Divorced.      Has a living will.   Would desire CPR, would not desire extraordinary measures.         The PMH, PSH, Social History, Family History, Medications, and allergies have been reviewed in [facility], and have been updated if relevant.     Review of Systems  Constitutional: Negative.   HENT: Negative.   Respiratory: Negative.   Cardiovascular: Negative.   Gastrointestinal: Negative.   Musculoskeletal: Positive for arthralgias.  Neurological: Negative.   Hematological: Negative.   Psychiatric/Behavioral: Negative.   All other systems reviewed and are negative.      Objective:    BP 112/70 mmHg  Pulse 84  Temp(Src) 97.8 F (36.6 C) (Oral)  Wt 150 lb 12 oz (68.38 kg)   Physical Exam  Constitutional: She is oriented to person, place, and time. She appears well-developed and well-nourished. No distress.  HENT:  Head: Normocephalic.  Eyes: Conjunctivae are normal.  Cardiovascular: Normal rate.   Pulmonary/Chest: Effort normal and breath sounds normal.  Musculoskeletal: Normal range of motion.  Neurological: She is alert and oriented to person, place, and time. No cranial nerve deficit.  Skin: Skin is warm and dry. She is not diaphoretic.  Psychiatric: She has a normal mood and affect. Her behavior is normal. Judgment and thought  content normal.  Nursing note and vitals reviewed.         Assessment & Plan:   Chronic obstructive airway disease with asthma ([place])  Hyperlipidemia  Hypothyroidism, unspecified hypothyroidism type No Follow-up on file.

## 2015-04-04 NOTE — Progress Notes (Signed)
Pre visit review using our clinic review tool, if applicable. No additional management support is needed unless otherwise documented below in the visit note. 

## 2015-04-04 NOTE — Assessment & Plan Note (Signed)
>  25 minutes spent in face to face time with patient, >50% spent in counselling or coordination of care discussing her medications, COPD, OA, hypothyroidism.  Med list updated. Agreed we should get a right knee xray today because she would consider a right TKA if it is warranted to improve her quality of life.

## 2015-04-05 ENCOUNTER — Other Ambulatory Visit: Payer: Self-pay | Admitting: Family Medicine

## 2015-04-05 DIAGNOSIS — M2550 Pain in unspecified joint: Secondary | ICD-10-CM

## 2015-04-14 ENCOUNTER — Encounter: Payer: Self-pay | Admitting: Family Medicine

## 2015-04-15 ENCOUNTER — Other Ambulatory Visit: Payer: Self-pay | Admitting: Family Medicine

## 2015-04-15 ENCOUNTER — Telehealth: Payer: Self-pay | Admitting: Internal Medicine

## 2015-04-15 DIAGNOSIS — I739 Peripheral vascular disease, unspecified: Secondary | ICD-10-CM

## 2015-04-15 NOTE — Telephone Encounter (Signed)
Patient says that she has been on Anoro; she does not think that it works as well as Symbicort.  She read in the side effects about pain in arms and legs.  She has been having a lot of pain in her legs since starting Anoro.  She thinks she would be more comfortable going back to Symbicort.  She said that she looked in her formulary and Symbicort is a Tier 3, Anoro is also Tier 3.  She said that she can pay for the Symbicort until she gets in the donut hole.  So she would like to go back to Symbicort for now.   Dr. Stevenson ClinchMadaline Brilliant to switch back? Please advise.

## 2015-04-17 NOTE — Telephone Encounter (Signed)
Please stop Anoro. Restart her symbicort at her last prescribed dose.   Thank you

## 2015-04-18 ENCOUNTER — Other Ambulatory Visit: Payer: Self-pay | Admitting: Family Medicine

## 2015-04-18 DIAGNOSIS — I739 Peripheral vascular disease, unspecified: Secondary | ICD-10-CM

## 2015-04-18 MED ORDER — BUDESONIDE-FORMOTEROL FUMARATE 160-4.5 MCG/ACT IN AERO
2.0000 | INHALATION_SPRAY | Freq: Two times a day (BID) | RESPIRATORY_TRACT | Status: DC
Start: 2015-04-18 — End: 2016-10-26

## 2015-04-18 NOTE — Telephone Encounter (Signed)
Pt informed of response to restart Symbicort at previous dose. New rx sent into pt's pharmacy. Nothing further needed.

## 2015-04-20 ENCOUNTER — Other Ambulatory Visit: Payer: Self-pay | Admitting: Family Medicine

## 2015-04-20 ENCOUNTER — Ambulatory Visit (INDEPENDENT_AMBULATORY_CARE_PROVIDER_SITE_OTHER)
Admission: RE | Admit: 2015-04-20 | Discharge: 2015-04-20 | Disposition: A | Discharge status: Home / Self Care | Payer: Medicare Other | Source: Ambulatory Visit | Attending: Family Medicine | Admitting: Family Medicine

## 2015-04-20 ENCOUNTER — Encounter: Payer: Self-pay | Admitting: Family Medicine

## 2015-04-20 DIAGNOSIS — M19071 Primary osteoarthritis, right ankle and foot: Secondary | ICD-10-CM | POA: Diagnosis not present

## 2015-04-20 DIAGNOSIS — M25561 Pain in right knee: Secondary | ICD-10-CM | POA: Diagnosis not present

## 2015-04-21 ENCOUNTER — Encounter: Payer: Self-pay | Admitting: Family Medicine

## 2015-04-25 ENCOUNTER — Encounter: Payer: Self-pay | Admitting: Family Medicine

## 2015-05-03 DIAGNOSIS — M170 Bilateral primary osteoarthritis of knee: Secondary | ICD-10-CM | POA: Diagnosis not present

## 2015-05-03 DIAGNOSIS — I739 Peripheral vascular disease, unspecified: Secondary | ICD-10-CM | POA: Insufficient documentation

## 2015-05-03 DIAGNOSIS — M159 Polyosteoarthritis, unspecified: Secondary | ICD-10-CM | POA: Diagnosis not present

## 2015-05-03 DIAGNOSIS — M810 Age-related osteoporosis without current pathological fracture: Secondary | ICD-10-CM | POA: Diagnosis not present

## 2015-05-03 HISTORY — DX: Polyosteoarthritis, unspecified: M15.9

## 2015-05-04 ENCOUNTER — Ambulatory Visit: Payer: Medicare Other

## 2015-05-04 DIAGNOSIS — I739 Peripheral vascular disease, unspecified: Secondary | ICD-10-CM

## 2015-05-09 ENCOUNTER — Encounter: Payer: Self-pay | Admitting: Family Medicine

## 2015-05-11 ENCOUNTER — Telehealth: Payer: Self-pay

## 2015-05-11 NOTE — Telephone Encounter (Signed)
Pt left v/m requesting to schedule prolia injection. Per 04/04/15 office note pt's last prolia injection 05/11/14. Pt request cb.

## 2015-05-13 NOTE — Telephone Encounter (Signed)
Do you have the prolia vaccine for pt  Please advise so i can schedule appointment

## 2015-05-13 NOTE — Telephone Encounter (Signed)
Are you going to call pt when prolia comes?

## 2015-05-13 NOTE — Telephone Encounter (Signed)
Medication is here. Can you please complete benefits procedure for pt, please

## 2015-05-13 NOTE — Telephone Encounter (Signed)
Pt called wanting to schedule her prolia shot Is it ok to schedule??

## 2015-05-13 NOTE — Telephone Encounter (Signed)
Yes ok to schedule.

## 2015-05-13 NOTE — Telephone Encounter (Signed)
i have put in a request but they have not arrived yet

## 2015-05-17 DIAGNOSIS — M810 Age-related osteoporosis without current pathological fracture: Secondary | ICD-10-CM | POA: Diagnosis not present

## 2015-05-17 DIAGNOSIS — M25571 Pain in right ankle and joints of right foot: Secondary | ICD-10-CM

## 2015-05-17 DIAGNOSIS — M79671 Pain in right foot: Secondary | ICD-10-CM | POA: Diagnosis not present

## 2015-05-17 DIAGNOSIS — M159 Polyosteoarthritis, unspecified: Secondary | ICD-10-CM | POA: Diagnosis not present

## 2015-05-17 HISTORY — DX: Pain in right ankle and joints of right foot: M25.571

## 2015-05-30 NOTE — Telephone Encounter (Signed)
I have electronically submitted pt's info for Prolia insurance verification and will notify you once I have a response. Thank you. °

## 2015-06-07 ENCOUNTER — Other Ambulatory Visit (INDEPENDENT_AMBULATORY_CARE_PROVIDER_SITE_OTHER): Payer: Medicare Other

## 2015-06-07 DIAGNOSIS — M810 Age-related osteoporosis without current pathological fracture: Secondary | ICD-10-CM | POA: Diagnosis not present

## 2015-06-07 LAB — CALCIUM: CALCIUM: 9.3 mg/dL (ref 8.4–10.5)

## 2015-06-07 NOTE — Telephone Encounter (Signed)
Spoke to pt and advised of below. Pt verbally expressed understanding and Ca lab scheduled for this afternoon. Spoke to [place] regarding ordering medication

## 2015-06-07 NOTE — Telephone Encounter (Signed)
I have rec'd patient's insurance verification for Prolia and she has an estimated responsibility of $0.  Please make pt aware this is an estimate and we will not know an exact amt until insurance(s) has/have paid.  I have sent a copy of the summary of benefits to be scanned into pt's chart.    Once pt recs injection, please let me know actual injection date so I can update the Prolia portal.  If you have any questions, please let me know.  Thank you!

## 2015-06-09 ENCOUNTER — Ambulatory Visit (INDEPENDENT_AMBULATORY_CARE_PROVIDER_SITE_OTHER): Payer: Medicare Other | Admitting: *Deleted

## 2015-06-09 DIAGNOSIS — M810 Age-related osteoporosis without current pathological fracture: Secondary | ICD-10-CM

## 2015-06-09 DIAGNOSIS — M2170 Unequal limb length (acquired), unspecified site: Secondary | ICD-10-CM | POA: Diagnosis not present

## 2015-06-09 MED ORDER — DENOSUMAB 60 MG/ML ~~LOC~~ SOLN
60.0000 mg | Freq: Once | SUBCUTANEOUS | Status: AC
  Administered 2015-06-09: 60 mg via SUBCUTANEOUS

## 2015-06-16 DIAGNOSIS — G8929 Other chronic pain: Secondary | ICD-10-CM | POA: Diagnosis not present

## 2015-06-16 DIAGNOSIS — M545 Low back pain: Secondary | ICD-10-CM | POA: Diagnosis not present

## 2015-06-16 DIAGNOSIS — M25551 Pain in right hip: Secondary | ICD-10-CM | POA: Diagnosis not present

## 2015-06-22 ENCOUNTER — Encounter: Payer: Self-pay | Admitting: Family Medicine

## 2015-08-17 DIAGNOSIS — H2511 Age-related nuclear cataract, right eye: Secondary | ICD-10-CM | POA: Diagnosis not present

## 2015-08-30 ENCOUNTER — Other Ambulatory Visit: Payer: Self-pay | Admitting: Family Medicine

## 2015-09-21 ENCOUNTER — Telehealth: Payer: Self-pay | Admitting: Family Medicine

## 2015-09-21 NOTE — Telephone Encounter (Signed)
LM for pt to sch AWV and CPE, mn °

## 2015-10-13 ENCOUNTER — Other Ambulatory Visit: Payer: Self-pay | Admitting: Family Medicine

## 2015-10-13 DIAGNOSIS — E559 Vitamin D deficiency, unspecified: Secondary | ICD-10-CM

## 2015-10-13 DIAGNOSIS — Z01419 Encounter for gynecological examination (general) (routine) without abnormal findings: Secondary | ICD-10-CM

## 2015-10-13 DIAGNOSIS — E785 Hyperlipidemia, unspecified: Secondary | ICD-10-CM

## 2015-10-13 DIAGNOSIS — E032 Hypothyroidism due to medicaments and other exogenous substances: Secondary | ICD-10-CM

## 2015-10-17 ENCOUNTER — Other Ambulatory Visit (INDEPENDENT_AMBULATORY_CARE_PROVIDER_SITE_OTHER): Payer: Medicare Other

## 2015-10-17 ENCOUNTER — Ambulatory Visit: Payer: Medicare Other

## 2015-10-17 ENCOUNTER — Other Ambulatory Visit: Payer: Medicare Other

## 2015-10-17 DIAGNOSIS — Z01419 Encounter for gynecological examination (general) (routine) without abnormal findings: Secondary | ICD-10-CM

## 2015-10-17 DIAGNOSIS — E785 Hyperlipidemia, unspecified: Secondary | ICD-10-CM

## 2015-10-17 DIAGNOSIS — E559 Vitamin D deficiency, unspecified: Secondary | ICD-10-CM | POA: Diagnosis not present

## 2015-10-17 DIAGNOSIS — Z0000 Encounter for general adult medical examination without abnormal findings: Secondary | ICD-10-CM

## 2015-10-17 DIAGNOSIS — E032 Hypothyroidism due to medicaments and other exogenous substances: Secondary | ICD-10-CM | POA: Diagnosis not present

## 2015-10-17 DIAGNOSIS — Z23 Encounter for immunization: Secondary | ICD-10-CM | POA: Diagnosis not present

## 2015-10-17 LAB — LIPID PANEL
CHOL/HDL RATIO: 5
Cholesterol: 236 mg/dL — ABNORMAL HIGH (ref 0–200)
HDL: 51.1 mg/dL (ref >39.00)
LDL Cholesterol: 161 mg/dL — ABNORMAL HIGH (ref 0–99)
NONHDL: 185.33
TRIGLYCERIDES: 121 mg/dL (ref 0.0–149.0)
VLDL: 24.2 mg/dL (ref 0.0–40.0)

## 2015-10-17 LAB — COMPREHENSIVE METABOLIC PANEL
ALK PHOS: 37 U/L — AB (ref 39–117)
ALT: 10 U/L (ref 0–35)
AST: 14 U/L (ref 0–37)
Albumin: 4.1 g/dL (ref 3.5–5.2)
BILIRUBIN TOTAL: 0.7 mg/dL (ref 0.2–1.2)
BUN: 14 mg/dL (ref 6–23)
CALCIUM: 8.9 mg/dL (ref 8.4–10.5)
CO2: 33 meq/L — AB (ref 19–32)
CREATININE: 0.78 mg/dL (ref 0.40–1.20)
Chloride: 105 mEq/L (ref 96–112)
GFR: 75.95 mL/min (ref >60.00)
Glucose, Bld: 93 mg/dL (ref 70–99)
Potassium: 4.4 mEq/L (ref 3.5–5.1)
Sodium: 141 mEq/L (ref 135–145)
TOTAL PROTEIN: 6.7 g/dL (ref 6.0–8.3)

## 2015-10-17 LAB — TSH: TSH: 1.91 u[IU]/mL (ref 0.35–4.50)

## 2015-10-17 LAB — VITAMIN D 25 HYDROXY (VIT D DEFICIENCY, FRACTURES): VITD: 21.15 ng/mL — AB (ref 30.00–100.00)

## 2015-10-18 ENCOUNTER — Ambulatory Visit: Payer: Medicare Other

## 2015-10-18 ENCOUNTER — Other Ambulatory Visit: Payer: Medicare Other

## 2015-10-20 ENCOUNTER — Ambulatory Visit (INDEPENDENT_AMBULATORY_CARE_PROVIDER_SITE_OTHER): Payer: Medicare Other | Admitting: Family Medicine

## 2015-10-20 ENCOUNTER — Encounter: Payer: Self-pay | Admitting: Family Medicine

## 2015-10-20 VITALS — BP 112/62 | HR 86 | Temp 98.0°F | Ht 66.5 in | Wt 142.2 lb

## 2015-10-20 DIAGNOSIS — G4710 Hypersomnia, unspecified: Secondary | ICD-10-CM

## 2015-10-20 DIAGNOSIS — M179 Osteoarthritis of knee, unspecified: Secondary | ICD-10-CM | POA: Diagnosis not present

## 2015-10-20 DIAGNOSIS — M810 Age-related osteoporosis without current pathological fracture: Secondary | ICD-10-CM

## 2015-10-20 DIAGNOSIS — E032 Hypothyroidism due to medicaments and other exogenous substances: Secondary | ICD-10-CM | POA: Diagnosis not present

## 2015-10-20 DIAGNOSIS — E559 Vitamin D deficiency, unspecified: Secondary | ICD-10-CM

## 2015-10-20 DIAGNOSIS — Z0000 Encounter for general adult medical examination without abnormal findings: Secondary | ICD-10-CM | POA: Diagnosis not present

## 2015-10-20 DIAGNOSIS — G4730 Sleep apnea, unspecified: Secondary | ICD-10-CM

## 2015-10-20 DIAGNOSIS — M1711 Unilateral primary osteoarthritis, right knee: Secondary | ICD-10-CM

## 2015-10-20 DIAGNOSIS — E785 Hyperlipidemia, unspecified: Secondary | ICD-10-CM

## 2015-10-20 MED ORDER — SIMVASTATIN 20 MG PO TABS: 20.0000 mg | ORAL_TABLET | Freq: Every day | ORAL | 3 refills | Status: DC

## 2015-10-20 MED ORDER — VITAMIN D (ERGOCALCIFEROL) 1.25 MG (50000 UNIT) PO CAPS: 50000.0000 [IU] | ORAL_CAPSULE | ORAL | 0 refills | Status: DC

## 2015-10-20 NOTE — Assessment & Plan Note (Signed)
The patients weight, height, BMI and visual acuity have been recorded in the chart.  Cognitive function assessed.   I have made referrals, counseling and provided education to the patient based review of the above and I have provided the pt with a written personalized care plan for preventive services.  

## 2015-10-20 NOTE — Assessment & Plan Note (Signed)
On prolia. Order DEXA today.

## 2015-10-20 NOTE — Progress Notes (Signed)
Patient ID: Dorothy Church, female    DOB: [birth year], 80 y.o.   MRN: [id number]  HPI Comments:  Very pleasant 80 yo female with a history of asthma, obstructive sleep apnea who wears CPAP, hyperlipidemia, OA, osteoporosis here for medicare wellness visit and follow up of chronic medical conditions.  I have personally reviewed the Medicare Annual Wellness questionnaire and have noted 1. The patient's medical and social history 2. Their use of alcohol, tobacco or illicit drugs 3. Their current medications and supplements 4. The patient's functional ability including ADL's, fall risks, home safety risks and hearing or visual             impairment. 5. Diet and physical activities 6. Evidence for depression or mood disorders  End of life wishes discussed and updated in Social History.  The roster of all physicians providing medical care to patient - is listed in the Care[employer]ams section of the chart.  Mammogram 05/19/13 Zostavax 05/08/2007 Pneumovax 10/07/2009 Prevnar 13 11/24/13 Influenza 10/17/15 12/14/14 DEXA 05/19/13  No h/o post menopausal bleeding (early menopause in 34s).  Hypothyroidism- taking synthroid 50 mcg daily.  Denies symptoms of hypo or hyperthyroidism. Lab Results  Component Value Date   TSH 1.91 10/17/2015    OSA- Wears a CPAP.  Osteoporosis-  She is receiving prolia, last injection 05/11/14.  Most recent DEXA from 05/20/14 did show improvement- now osteopenic.  Emphysema- followed by pulmonary.  Was last seen by [facility] on 02/08/15- note reviewed. Started Breo and Incruse.  HLD- was taking zocor 40 mg daily.  Stopped taking due to myalgias but did not help much.  Lab Results  Component Value Date   CHOL 236 (H) 10/17/2015   HDL 51.10 10/17/2015   LDLCALC 161 (H) 10/17/2015   TRIG 121.0 10/17/2015   CHOLHDL 5 10/17/2015   Anxiety- has been well controlled with Paxil 30 mg daily. Denies symptoms of anxiety or depression. Lab Results  Component Value Date   CREATININE 0.78 10/17/2015   Lab Results  Component Value Date   TSH 1.91 10/17/2015   Lab Results  Component Value Date   WBC 5.5 09/02/2014   HGB 14.0 09/02/2014   HCT 42.2 09/02/2014   MCV 90.8 09/02/2014   PLT 222.0 09/02/2014   Lab Results  Component Value Date   ALT 10 10/17/2015   AST 14 10/17/2015   ALKPHOS 37 (L) 10/17/2015   BILITOT 0.7 10/17/2015     Patient Active Problem List   Diagnosis Date Noted  . OA (osteoarthritis) 04/04/2015  . Chronic obstructive airway disease with asthma ([place]) 11/10/2014  . History of colonic polyps 12/08/2013  . Medicare annual wellness visit, subsequent 04/23/2013  . Hypersomnia with sleep apnea 04/23/2013  . Osteoporosis 10/07/2012  . Vitamin D deficiency   . Hypothyroidism   . Hyperlipidemia 08/07/2012  . Obstructive sleep apnea on CPAP 08/07/2012  . Asthma 08/07/2012   Past Medical History:  Diagnosis Date  . Aortic valve disorders   . Arthritis   . Barrett's esophagus   . COPD (chronic obstructive pulmonary disease) ([place])   . Hypothyroidism   . Impaired fasting glucose   . Intrinsic asthma, unspecified   . Obstructive sleep apnea (adult) (pediatric)   . Osteoporosis   . Pure hypercholesterolemia   . Syncope and collapse   . Vitamin D deficiency    Past Surgical History:  Procedure Laterality Date  . COLONOSCOPY    . COLONOSCOPY WITH PROPOFOL N/A 12/14/2014   Procedure: COLONOSCOPY WITH PROPOFOL;  Surgeon:  Lollie Sails, MD;  Location: [facility] ENDOSCOPY;  Service: Endoscopy;  Laterality: N/A;  . ESOPHAGOGASTRODUODENOSCOPY (EGD) WITH PROPOFOL N/A 12/14/2014   Procedure: ESOPHAGOGASTRODUODENOSCOPY (EGD) WITH PROPOFOL;  Surgeon: Lollie Sails, MD;  Location: [facility] ENDOSCOPY;  Service: Endoscopy;  Laterality: N/A;  . EYE SURGERY    . HIP SURGERY    . prosthesis eye implant    . REPLACEMENT UNICONDYLAR JOINT KNEE    . TONSILLECTOMY     Social History  Substance Use Topics  . Smoking status: Never Smoker  .  Smokeless tobacco: Never Used  . Alcohol use 0.0 oz/week     Comment: occas. wine   Family History  Problem Relation Age of Onset  . Heart disease Father   . Bipolar disorder Brother    Allergies  Allergen Reactions  . Amoxicillin Other (See Comments)    Pencillins  . Cefzil [Cefprozil]   . Cephalexin   . Propoxyphene     Other reaction(s): Unknown  . Relafen [Nabumetone]   . Talwin [Pentazocine]    Current Outpatient Prescriptions on File Prior to Visit  Medication Sig Dispense Refill  . albuterol (VENTOLIN HFA) 108 (90 BASE) MCG/ACT inhaler Inhale 2 puffs into the lungs every 6 (six) hours as needed for wheezing. 18 g 5  . aspirin 81 MG tablet Take 81 mg by mouth daily.    . budesonide-formoterol (SYMBICORT) 160-4.5 MCG/ACT inhaler Inhale 2 puffs into the lungs 2 (two) times daily. 3 Inhaler 3  . calcium carbonate (OS-CAL) 600 MG TABS Take 600 mg by mouth daily.    Marland Kitchen denosumab (PROLIA) 60 MG/ML SOLN injection Inject 60 mg into the skin every 6 (six) months. Administer in upper arm, thigh, or abdomen    . diclofenac sodium (VOLTAREN) 1 % GEL Apply 4 g topically 4 (four) times daily. 5 Tube 11  . esomeprazole (NEXIUM) 40 MG capsule Take 40 mg by mouth daily before breakfast.    . Fluticasone Furoate-Vilanterol (BREO ELLIPTA) 200-25 MCG/INH AEPB Inhale 1 puff into the lungs daily. 180 each 2  . ipratropium-albuterol (DUONEB) 0.5-2.5 (3) MG/3ML SOLN Take 3 mLs by nebulization every 8 (eight) hours as needed. 1 ampule 3 times a day for 3 days. 360 mL 3  . levothyroxine (SYNTHROID, LEVOTHROID) 50 MCG tablet Take 1 tablet (50 mcg  total) by mouth daily  before breakfast. 90 tablet 3  . Multiple Vitamin (MULTIVITAMIN) tablet Take 1 tablet by mouth daily.    Marland Kitchen PARoxetine (PAXIL) 30 MG tablet Take 1 tablet by mouth  every morning 90 tablet 3  . simvastatin (ZOCOR) 40 MG tablet Take 1 tablet (40 mg total) by mouth daily at 6 PM. OFFICE VISIT WITH LABS REQUIRED FOR ADDITIONAL REFILLS 30  tablet 0   No current facility-administered medications on file prior to visit.    The PMH, PSH, Social History, Family History, Medications, and allergies have been reviewed in [facility], and have been updated if relevant.     Review of Systems  Constitutional: Negative.   HENT:       Decreased hearing  Eyes: Negative.   Respiratory: Negative for shortness of breath, wheezing and stridor.   Cardiovascular: Negative.   Gastrointestinal: Negative.   Endocrine: Negative.   Genitourinary: Negative.   Musculoskeletal: Negative.   Skin: Negative.   Allergic/Immunologic: Negative.   Neurological: Negative.   Hematological: Negative.   Psychiatric/Behavioral: Negative.   All other systems reviewed and are negative.       Physical Exam  BP 112/62  Pulse 86   Temp 98 F (36.7 C) (Oral)   Ht 5' 6.5" (1.689 m)   Wt 142 lb 4 oz (64.5 kg)   SpO2 96%   BMI 22.62 kg/m   Constitutional: She is oriented to person, place, and time. She appears well-developed and well-nourished.  HENT:  Head: Normocephalic.  Nose: Nose normal.  Mouth/Throat: Oropharynx is clear and moist.  Eyes: Conjunctivae are normal. Pupils are equal, round, and reactive to light.  Neck: Normal range of motion. Neck supple. No JVD present.  Cardiovascular: Normal rate, regular rhythm, S1 normal, S2 normal, normal heart sounds and intact distal pulses.  Exam reveals no gallop and no friction rub.   No murmur heard. Pulmonary/Chest: Effort normal. No respiratory distress. She has no wheezes. She has no rales. She exhibits no tenderness.  faint crackles at the bases bilaterally Abdominal: Soft. Bowel sounds are normal. She exhibits no distension. There is no tenderness.  Musculoskeletal: Normal range of motion. She exhibits no edema and no tenderness.  Lymphadenopathy:    She has no cervical adenopathy.  Neurological: She is alert and oriented to person, place, and time. Coordination normal.  Skin: Skin is warm and  dry. No rash noted. No erythema.  Psychiatric: She has a normal mood and affect. Her behavior is normal. Judgment and thought content normal.

## 2015-10-20 NOTE — Patient Instructions (Addendum)
Great to see you. Happy birthday!  We are starting Vit D 50, 000 IU weekly for 7 weeks.  Please schedule your bone density scan and mammogram.  Please return in a few months to recheck labs.

## 2015-10-20 NOTE — Assessment & Plan Note (Signed)
New- eRx sent for Vit D 50,000 IU once weekly.

## 2015-10-20 NOTE — Progress Notes (Signed)
Pre visit review using our clinic review tool, if applicable. No additional management support is needed unless otherwise documented below in the visit note. 

## 2015-10-20 NOTE — Assessment & Plan Note (Signed)
Deteriorated.  Stopped taking zocor 40 mg daily due to myalgias. Will restart zocor at 20 mg daily. Repeat labs in a few months.

## 2015-10-20 NOTE — Assessment & Plan Note (Signed)
Euthyroid. No changes made to thyroid rx.

## 2015-10-27 ENCOUNTER — Telehealth: Payer: Self-pay | Admitting: Family Medicine

## 2015-10-27 ENCOUNTER — Ambulatory Visit: Payer: Medicare Other

## 2015-10-27 NOTE — Telephone Encounter (Signed)
I have electronically submitted pt's info for Prolia insurance verification and will notify you once I have a response. Thank you. °

## 2015-10-28 ENCOUNTER — Ambulatory Visit: Payer: Medicare Other

## 2015-10-31 NOTE — Telephone Encounter (Signed)
Lm on pts vm requesting a call back 

## 2015-10-31 NOTE — Telephone Encounter (Signed)
I have rec'd Dorothy Church's insurance verification for Prolia and she has an estimated responsibility of $0.  Please make pt aware this is an estimate and we will not know an exact amt until insurance(s) has/have paid.  I have sent a copy of the summary of benefits to be scanned into pt's chart.    Once pt recs injection, please let me know actual injection date so I can update the Prolia portal.  If you have any questions, please let me know.  Thank you!  Cc: Beatriz Stallion (for ordering purposes)

## 2015-11-01 NOTE — Telephone Encounter (Signed)
Spoke to pt and advised. Ca lab scheduled

## 2015-11-01 NOTE — Telephone Encounter (Signed)
Prolia ordered for pt

## 2015-11-07 ENCOUNTER — Other Ambulatory Visit: Payer: Self-pay | Admitting: Family Medicine

## 2015-11-07 DIAGNOSIS — Z1231 Encounter for screening mammogram for malignant neoplasm of breast: Secondary | ICD-10-CM

## 2015-11-11 ENCOUNTER — Other Ambulatory Visit (INDEPENDENT_AMBULATORY_CARE_PROVIDER_SITE_OTHER): Payer: Medicare Other

## 2015-11-11 DIAGNOSIS — M810 Age-related osteoporosis without current pathological fracture: Secondary | ICD-10-CM

## 2015-11-11 LAB — CALCIUM: Calcium: 8.6 mg/dL (ref 8.4–10.5)

## 2015-11-23 DIAGNOSIS — M810 Age-related osteoporosis without current pathological fracture: Secondary | ICD-10-CM | POA: Diagnosis not present

## 2015-11-23 DIAGNOSIS — M159 Polyosteoarthritis, unspecified: Secondary | ICD-10-CM | POA: Insufficient documentation

## 2015-11-23 DIAGNOSIS — M170 Bilateral primary osteoarthritis of knee: Secondary | ICD-10-CM | POA: Diagnosis not present

## 2015-11-23 HISTORY — DX: Polyosteoarthritis, unspecified: M15.9

## 2015-12-01 ENCOUNTER — Ambulatory Visit
Admission: RE | Admit: 2015-12-01 | Discharge: 2015-12-01 | Disposition: A | Discharge status: Home / Self Care | Payer: Medicare Other | Source: Ambulatory Visit | Attending: Family Medicine | Admitting: Family Medicine

## 2015-12-01 ENCOUNTER — Other Ambulatory Visit: Payer: Self-pay | Admitting: Family Medicine

## 2015-12-01 DIAGNOSIS — M85851 Other specified disorders of bone density and structure, right thigh: Secondary | ICD-10-CM | POA: Diagnosis not present

## 2015-12-01 DIAGNOSIS — Z1231 Encounter for screening mammogram for malignant neoplasm of breast: Secondary | ICD-10-CM

## 2015-12-01 DIAGNOSIS — M85861 Other specified disorders of bone density and structure, right lower leg: Secondary | ICD-10-CM | POA: Diagnosis not present

## 2015-12-01 DIAGNOSIS — M8588 Other specified disorders of bone density and structure, other site: Secondary | ICD-10-CM | POA: Diagnosis not present

## 2015-12-01 DIAGNOSIS — M810 Age-related osteoporosis without current pathological fracture: Secondary | ICD-10-CM

## 2015-12-06 ENCOUNTER — Other Ambulatory Visit: Payer: Self-pay | Admitting: Family Medicine

## 2015-12-06 NOTE — Telephone Encounter (Signed)
Is pt needing to continue high dose VitD? 

## 2015-12-13 ENCOUNTER — Ambulatory Visit (INDEPENDENT_AMBULATORY_CARE_PROVIDER_SITE_OTHER): Payer: Medicare Other | Admitting: *Deleted

## 2015-12-13 DIAGNOSIS — M810 Age-related osteoporosis without current pathological fracture: Secondary | ICD-10-CM | POA: Diagnosis not present

## 2015-12-13 MED ORDER — DENOSUMAB 60 MG/ML ~~LOC~~ SOLN
60.0000 mg | SUBCUTANEOUS | Status: DC
  Administered 2015-12-13: 60 mg via SUBCUTANEOUS

## 2015-12-19 ENCOUNTER — Other Ambulatory Visit: Payer: Self-pay | Admitting: Family Medicine

## 2015-12-19 DIAGNOSIS — E559 Vitamin D deficiency, unspecified: Secondary | ICD-10-CM

## 2015-12-20 ENCOUNTER — Encounter: Payer: Self-pay | Admitting: Family Medicine

## 2015-12-26 ENCOUNTER — Other Ambulatory Visit (INDEPENDENT_AMBULATORY_CARE_PROVIDER_SITE_OTHER): Payer: Medicare Other

## 2015-12-26 DIAGNOSIS — E7800 Pure hypercholesterolemia, unspecified: Secondary | ICD-10-CM

## 2015-12-26 DIAGNOSIS — E559 Vitamin D deficiency, unspecified: Secondary | ICD-10-CM

## 2015-12-26 LAB — VITAMIN D 25 HYDROXY (VIT D DEFICIENCY, FRACTURES): VITD: 36.73 ng/mL (ref 30.00–100.00)

## 2015-12-26 LAB — HEPATIC FUNCTION PANEL
ALK PHOS: 48 U/L (ref 39–117)
ALT: 13 U/L (ref 0–35)
AST: 15 U/L (ref 0–37)
Albumin: 4 g/dL (ref 3.5–5.2)
BILIRUBIN DIRECT: 0.1 mg/dL (ref 0.0–0.3)
BILIRUBIN TOTAL: 0.4 mg/dL (ref 0.2–1.2)
TOTAL PROTEIN: 6.3 g/dL (ref 6.0–8.3)

## 2015-12-26 LAB — LIPID PANEL
CHOLESTEROL: 150 mg/dL (ref 0–200)
HDL: 55 mg/dL (ref >39.00)
LDL CALC: 77 mg/dL (ref 0–99)
NonHDL: 94.84
TRIGLYCERIDES: 90 mg/dL (ref 0.0–149.0)
Total CHOL/HDL Ratio: 3
VLDL: 18 mg/dL (ref 0.0–40.0)

## 2016-01-17 ENCOUNTER — Telehealth: Payer: Self-pay | Admitting: Internal Medicine

## 2016-01-17 NOTE — Telephone Encounter (Signed)
Pt states she is eligible for a smaller sleep machine. She will need a rx faxed to [facility] Fax is [phone]

## 2016-01-17 NOTE — Telephone Encounter (Signed)
LMOVM for pt to return call 

## 2016-01-18 ENCOUNTER — Encounter: Payer: Self-pay | Admitting: Internal Medicine

## 2016-01-18 NOTE — Telephone Encounter (Signed)
Spoke with pt and informed her she needs a f/u appt since it has been almost a year. Pt states she will try and find her last sleep study b/c the provider that ordered in in [place] is no longer practicing. appt scheduled and nothing further needed.

## 2016-01-19 ENCOUNTER — Ambulatory Visit (INDEPENDENT_AMBULATORY_CARE_PROVIDER_SITE_OTHER): Payer: Medicare Other | Admitting: Internal Medicine

## 2016-01-19 ENCOUNTER — Encounter: Payer: Self-pay | Admitting: Internal Medicine

## 2016-01-19 VITALS — BP 120/60 | HR 95 | Wt 142.0 lb

## 2016-01-19 DIAGNOSIS — Z9989 Dependence on other enabling machines and devices: Secondary | ICD-10-CM

## 2016-01-19 DIAGNOSIS — G4733 Obstructive sleep apnea (adult) (pediatric): Secondary | ICD-10-CM

## 2016-01-19 DIAGNOSIS — J449 Chronic obstructive pulmonary disease, unspecified: Secondary | ICD-10-CM | POA: Diagnosis not present

## 2016-01-19 NOTE — Assessment & Plan Note (Signed)
Patient with severe sleep apnea, with 92% compliance on AutoPap. 90 for percentile of AutoPap 14 cm of water Overall with excellent compliance. She has been approved for a new CPAP/R Pap machine.  Plan: -Prescription for new CPAP/AutoPap machine along with supplies. -Continue with AutoPap as stated above.

## 2016-01-19 NOTE — Patient Instructions (Addendum)
Follow up with Dr. Juanell Fairly in 3 months - we will write for a new cpap machine and supplies for you - you might need a new split night study, depending on your insurance company coverage - cont with Symbicort and spiriva - cont with exercise as tolerated.

## 2016-01-19 NOTE — Assessment & Plan Note (Signed)
Patient with prolonged asthma and is a never smoker with no significant exposure to 2nd hand smoke. PFTs with moderate obstruction, with significant reversibility.  I believe with her prolonged asthma, she is most likely starting to have more fixed airway (which can be seen in prolonged asthmatic), symptoms mimic those of COPD, but pathophysiology is different for the 2 entities.  Given her symptoms and PFTs findings, will continue to manage as an Asthma-COPD combination.  Plan: -cont with spiriva and symbicort - cont with exercise - albuterol as needed.

## 2016-01-19 NOTE — Progress Notes (Signed)
[facility] Consultation      MRN# [id number] Dorothy Church 10-Sep-1935   CC: Chief Complaint  Patient presents with  . Follow-up    requesting new CPAP machine: uses nightly: feels she is doing well: SOB mainly w/activity:       Brief History: HPI 04/27/14 She is a pleasant 80 year old female past medical history of asthma presenting today for as optimization. Patient states that over the past 2 months asthma has been hard to control, she is also until she may have COPD. History as stated: In January patient presented to the ER with shortness of breath and cough she was given prednisone and antibiotics and nebulizers at that time and discharged home she improved after that however in March she noted to have increased shortness of breath fatigue chest tightness and wheezing, shortness of PMD on March 7 and was given 2 nebulizer treatments with improvement along with prescription for antibiotics and steroids. Currently she denies any fever or chills. Patient states she had asthma during childhood where she had frequent hospitalizations but no intubations. About 6 years ago she was told her asthma had returned due to increasing shortness of breath and some subjective chest congestion with possible upper respiratory tract infections. Patient states her breathing is trouble with certain triggers such as mold, dust, damp areas. She was told about 45 years ago she had COPD and was also placed on Symbicort at that time along with as needed albuterol. Currently patient states that she still feels fatigue with chest congestion and some wheezing however she has no recent sick contact exposure. 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 She has a history of obstructive sleep apnea diagnosed 8 years ago and she's currently on CPAP; she doesn't recall her CPAP settings. Patient states she never smoked tobacco had any secondhand  exposure. 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 Plan: nebs, repeat CT   ROV 06/17/14 Patient presents today for a follow up visit, doing well today. Back to using symbicort. Had a CT Chest since last visit. Only using rescue inhaler about 1 time a week since last visit.   Plan: - pulmonary function testing and 6 minute walk test prior to follow up - Spiriva Respimat (2.47mcg) - 2 puff once a day - Patient given a sample - if having improvement by week 2, then patient will call us and we can fill this prescription - continue with Symbicort - continue with albuterol rescue inhaler - 2puff every 3-4 hours as needed for shortness of breath\wheezing\recurrent cough - a followup visit will consider further evaluation with GI referral if patient is still symptomatic with shortness of breath and cough  ROV 08/16/14: Patient presents for follow up visit of COPD along with PFTs and 6 MWT  Did not call in refill for spiriva, but stated that the 2 week trial was helpful Using Symbicort as directed.  Patient states overall she is doing well except she has shortness of breath mainly with exertion. No cough, no wheeze, no chest tightness. Plan: - Spiriva Respimat (2.45mcg) - 2 puff once a day - Patient given a sample - continue with Symbicort - continue with albuterol rescue inhaler - 2puff every 3-4 hours as needed for shortness of breath\wheezing\recurrent cough - Referral to pulmonary rehabilitation  ROV 11/10/14 Patient presents today for a follow up visit.  Still with some dyspnea,  but states that it has been improving. Currently on symbicort and spiriva.  States that she is in the insurance lapse, and her inhalers are very expensive.  Currently attending pulm rehab, attended 3 weeks so far.   ONO in 09/2014 showed that she does not require supplemental O2 at night.  Plan -continue with Symbicort and Spiriva,  albuterol as needed, exercise as tolerated.  Events since last clinic visit: She presents today for follow-up visit of her asthma/COPD and OSA. Overall she is doing well, no URIs this year, no need for antibiotics and extra use of inhalers. She is approved for a new cpap machine and is requesting a rx for one. Last sleep study was done 2010, sleep titration study showed severe sleep apnea with optimal CPAP setting of 8. She has been completely compliant with her CPAP machine how last download per our records was October 22 December 2015, with a compliance of 93%. Per her compliance report looks like she is on CPAP, minimum setting 5 cm, maximum setting 20 cm. 95th percentile on AutoPap is 14.0. Medication:    Current Outpatient Prescriptions:  .  albuterol (VENTOLIN HFA) 108 (90 BASE) MCG/ACT inhaler, Inhale 2 puffs into the lungs every 6 (six) hours as needed for wheezing., Disp: 18 g, Rfl: 5 .  aspirin 81 MG tablet, Take 81 mg by mouth daily., Disp: , Rfl:  .  budesonide-formoterol (SYMBICORT) 160-4.5 MCG/ACT inhaler, Inhale 2 puffs into the lungs 2 (two) times daily., Disp: 3 Inhaler, Rfl: 3 .  calcium carbonate (OS-CAL) 600 MG TABS, Take 600 mg by mouth daily., Disp: , Rfl:  .  diclofenac sodium (VOLTAREN) 1 % GEL, Apply 4 g topically 4 (four) times daily., Disp: 5 Tube, Rfl: 11 .  esomeprazole (NEXIUM) 40 MG capsule, Take 40 mg by mouth daily before breakfast., Disp: , Rfl:  .  ipratropium-albuterol (DUONEB) 0.5-2.5 (3) MG/3ML SOLN, Take 3 mLs by nebulization every 8 (eight) hours as needed. 1 ampule 3 times a day for 3 days., Disp: 360 mL, Rfl: 3 .  levothyroxine (SYNTHROID, LEVOTHROID) 50 MCG tablet, Take 1 tablet (50 mcg  total) by mouth daily  before breakfast., Disp: 90 tablet, Rfl: 3 .  Multiple Vitamin (MULTIVITAMIN) tablet, Take 1 tablet by mouth daily., Disp: , Rfl:  .  PARoxetine (PAXIL) 30 MG tablet, Take 1 tablet by mouth  every morning, Disp: 90 tablet, Rfl: 3 .  simvastatin  (ZOCOR) 20 MG tablet, Take 1 tablet (20 mg total) by mouth at bedtime., Disp: 90 tablet, Rfl: 3 .  Vitamin D, Ergocalciferol, (DRISDOL) 50000 units CAPS capsule, TAKE 1 CAPSULE (50,000 UNITS TOTAL) BY MOUTH EVERY 7 (SEVEN) DAYS., Disp: 7 capsule, Rfl: 0  Current Facility-Administered Medications:  .  denosumab (PROLIA) injection 60 mg, 60 mg, Subcutaneous, Q6 months, Lucille Passy, MD, 60 mg at 12/13/15 1422    Review of Systems  Constitutional: Negative for chills and fever.  Eyes: Negative for blurred vision.  Respiratory: Negative for cough, hemoptysis, sputum production and shortness of breath.   Cardiovascular: Negative for chest pain and palpitations.  Gastrointestinal: Negative for heartburn.  Genitourinary: Negative for dysuria.  Musculoskeletal: Negative for myalgias.  Skin: Negative for itching and rash.  Neurological: Negative for dizziness and headaches.  Endo/Heme/Allergies: Does not bruise/bleed easily.  Psychiatric/Behavioral: Negative for depression.      Allergies:  Amoxicillin; Cefzil [cefprozil]; Cephalexin; Propoxyphene; Relafen [nabumetone]; and Talwin [pentazocine]  Physical Examination:  VS: BP 120/60 (BP Location: Left Arm, Cuff Size: Normal)  Pulse 95   Wt 142 lb (64.4 kg)   SpO2 96%   BMI 22.58 kg/m   General Appearance: No distress  HEENT: PERRLA, no ptosis, no other lesions noticed Pulmonary:normal breath sounds., diaphragmatic excursion normal.No wheezing, No rales   Cardiovascular:  Normal S1,S2.  No m/r/g.     Abdomen:Exam: Benign, Soft, non-tender, No masses  Skin:   warm, no rashes, no ecchymosis  Extremities: normal, no cyanosis, clubbing, warm with normal capillary refill.     Assessment and Plan:80 yo F with PMHx of Asthma and OSA presenting for OSA follow-up Obstructive sleep apnea on CPAP Patient with severe sleep apnea, with 92% compliance on AutoPap. 90 for percentile of AutoPap 14 cm of water Overall with excellent  compliance. She has been approved for a new CPAP/R Pap machine.  Plan: -Prescription for new CPAP/AutoPap machine along with supplies. -Continue with AutoPap as stated above.  Chronic obstructive airway disease with asthma Patient with prolonged asthma and is a never smoker with no significant exposure to 2nd hand smoke. PFTs with moderate obstruction, with significant reversibility.  I believe with her prolonged asthma, she is most likely starting to have more fixed airway (which can be seen in prolonged asthmatic), symptoms mimic those of COPD, but pathophysiology is different for the 2 entities.  Given her symptoms and PFTs findings, will continue to manage as an Asthma-COPD combination.  Plan: -cont with spiriva and symbicort - cont with exercise - albuterol as needed.      Updated Medication List Outpatient Encounter Prescriptions as of 01/19/2016  Medication Sig  . albuterol (VENTOLIN HFA) 108 (90 BASE) MCG/ACT inhaler Inhale 2 puffs into the lungs every 6 (six) hours as needed for wheezing.  Marland Kitchen aspirin 81 MG tablet Take 81 mg by mouth daily.  . budesonide-formoterol (SYMBICORT) 160-4.5 MCG/ACT inhaler Inhale 2 puffs into the lungs 2 (two) times daily.  . calcium carbonate (OS-CAL) 600 MG TABS Take 600 mg by mouth daily.  . diclofenac sodium (VOLTAREN) 1 % GEL Apply 4 g topically 4 (four) times daily.  Marland Kitchen esomeprazole (NEXIUM) 40 MG capsule Take 40 mg by mouth daily before breakfast.  . ipratropium-albuterol (DUONEB) 0.5-2.5 (3) MG/3ML SOLN Take 3 mLs by nebulization every 8 (eight) hours as needed. 1 ampule 3 times a day for 3 days.  Marland Kitchen levothyroxine (SYNTHROID, LEVOTHROID) 50 MCG tablet Take 1 tablet (50 mcg  total) by mouth daily  before breakfast.  . Multiple Vitamin (MULTIVITAMIN) tablet Take 1 tablet by mouth daily.  Marland Kitchen PARoxetine (PAXIL) 30 MG tablet Take 1 tablet by mouth  every morning  . simvastatin (ZOCOR) 20 MG tablet Take 1 tablet (20 mg total) by mouth at bedtime.   . Vitamin D, Ergocalciferol, (DRISDOL) 50000 units CAPS capsule TAKE 1 CAPSULE (50,000 UNITS TOTAL) BY MOUTH EVERY 7 (SEVEN) DAYS.  . [DISCONTINUED] Fluticasone Furoate-Vilanterol (BREO ELLIPTA) 200-25 MCG/INH AEPB Inhale 1 puff into the lungs daily.   Facility-Administered Encounter Medications as of 01/19/2016  Medication  . denosumab (PROLIA) injection 60 mg    Orders for this visit: No orders of the defined types were placed in this encounter.   Thank  you for the visitation and for allowing  [facility] to assist in the care of your patient. Our recommendations are noted above.  Please contact us if we can be of further service.  Vilinda Boehringer, MD [facility] Office Number: [phone]

## 2016-01-19 NOTE — Addendum Note (Signed)
Addended by: Oscar La R on: 01/19/2016 11:00 AM   Modules accepted: Orders

## 2016-02-07 ENCOUNTER — Other Ambulatory Visit: Payer: Self-pay | Admitting: Family Medicine

## 2016-02-08 NOTE — Telephone Encounter (Signed)
paxil last filled on 04/04/15 #90 + 3, medicare visit was on 10/20/15. Ok to refill?

## 2016-02-27 DIAGNOSIS — H40001 Preglaucoma, unspecified, right eye: Secondary | ICD-10-CM | POA: Diagnosis not present

## 2016-04-03 ENCOUNTER — Telehealth: Payer: Self-pay | Admitting: Internal Medicine

## 2016-04-03 NOTE — Telephone Encounter (Signed)
Order placed 12/2015 and pt states she hasn't received her CPAP machine.

## 2016-04-03 NOTE — Telephone Encounter (Signed)
This is b/c [employer] is waiting on the Addendum is not signed.  Dorothy Church

## 2016-04-03 NOTE — Telephone Encounter (Signed)
Will you please sign the addendum so that pt can get her CPAP. She is scheduled to see you 04/2016. Thanks.

## 2016-04-03 NOTE — Telephone Encounter (Signed)
Pt would like to know the status of her CPAP machine through [facility]. Please call and advise.

## 2016-04-05 NOTE — Telephone Encounter (Signed)
Signed by DK. Nothing further needed.

## 2016-04-17 ENCOUNTER — Ambulatory Visit (INDEPENDENT_AMBULATORY_CARE_PROVIDER_SITE_OTHER): Payer: Medicare Other | Admitting: Primary Care

## 2016-04-17 VITALS — BP 118/68 | HR 87 | Temp 98.1°F | Ht 66.5 in | Wt 141.1 lb

## 2016-04-17 DIAGNOSIS — S61214A Laceration without foreign body of right ring finger without damage to nail, initial encounter: Secondary | ICD-10-CM

## 2016-04-17 DIAGNOSIS — Z23 Encounter for immunization: Secondary | ICD-10-CM

## 2016-04-17 NOTE — Patient Instructions (Addendum)
Refrain from using Peroxide, just use warm water for rinsing.   Apply a light Band aid just for coverage during the day, leave it open when resting at home.  Please call us if you experience increased redness, swelling, drainage, pain.  You were provided with a tetanus vaccination which will cover you for 10 years.  It was a pleasure meeting you!

## 2016-04-17 NOTE — Addendum Note (Signed)
Addended by: Jacqualin Combes on: 04/17/2016 04:39 PM   Modules accepted: Orders

## 2016-04-17 NOTE — Progress Notes (Signed)
Subjective:    Patient ID: Dorothy Church, female    DOB: 12-19-1935, 81 y.o.   MRN: [id number]  HPI  Dorothy Church is a 81 year old female who presents today for evaluation of right 4th digit. Saturday last weekend she was using hedge trimmers and accidentally cut the tip of her 4th digit. She's been cleansing the site with peroxide and applying clean dressings daily. She denies increased redness/drainage/pain. She thinks she is due for her tetanus.   Review of Systems  Constitutional: Negative for fever.  Skin: Positive for wound.  Neurological: Negative for numbness.       Past Medical History:  Diagnosis Date  . Aortic valve disorders   . Arthritis   . Barrett's esophagus   . COPD (chronic obstructive pulmonary disease) ([facility])   . Hypothyroidism   . Impaired fasting glucose   . Intrinsic asthma, unspecified   . Obstructive sleep apnea (adult) (pediatric)   . Osteoporosis   . Pure hypercholesterolemia   . Syncope and collapse   . Vitamin D deficiency      Social History   Social History  . Marital status: Divorced    Spouse name: N/A  . Number of children: N/A  . Years of education: N/A   Occupational History  . Not on file.   Social History Main Topics  . Smoking status: Never Smoker  . Smokeless tobacco: Never Used  . Alcohol use 0.0 oz/week     Comment: occas. wine  . Drug use: No  . Sexual activity: Not on file   Other Topics Concern  . Not on file   Social History Narrative   Lived in [place], moved to [place] two years ago.   Divorced.      Has a living will.   Would desire CPR, would not desire extraordinary measures.          Past Surgical History:  Procedure Laterality Date  . BREAST EXCISIONAL BIOPSY Left   . COLONOSCOPY    . COLONOSCOPY WITH PROPOFOL N/A 12/14/2014   Procedure: COLONOSCOPY WITH PROPOFOL;  Surgeon: Lollie Sails, MD;  Location: [facility] ENDOSCOPY;  Service: Endoscopy;  Laterality: N/A;  .  ESOPHAGOGASTRODUODENOSCOPY (EGD) WITH PROPOFOL N/A 12/14/2014   Procedure: ESOPHAGOGASTRODUODENOSCOPY (EGD) WITH PROPOFOL;  Surgeon: Lollie Sails, MD;  Location: [facility] ENDOSCOPY;  Service: Endoscopy;  Laterality: N/A;  . EYE SURGERY    . HIP SURGERY    . prosthesis eye implant    . REPLACEMENT UNICONDYLAR JOINT KNEE    . TONSILLECTOMY      Family History  Problem Relation Age of Onset  . Heart disease Father   . Breast cancer Mother   . Bipolar disorder Brother     Allergies  Allergen Reactions  . Amoxicillin Other (See Comments)    Pencillins  . Cefzil [Cefprozil]   . Cephalexin   . Propoxyphene     Other reaction(s): Unknown  . Relafen [Nabumetone]   . Talwin [Pentazocine]     Current Outpatient Prescriptions on File Prior to Visit  Medication Sig Dispense Refill  . albuterol (VENTOLIN HFA) 108 (90 BASE) MCG/ACT inhaler Inhale 2 puffs into the lungs every 6 (six) hours as needed for wheezing. 18 g 5  . aspirin 81 MG tablet Take 81 mg by mouth daily.    . budesonide-formoterol (SYMBICORT) 160-4.5 MCG/ACT inhaler Inhale 2 puffs into the lungs 2 (two) times daily. 3 Inhaler 3  . calcium carbonate (OS-CAL) 600 MG TABS Take  600 mg by mouth daily.    . diclofenac sodium (VOLTAREN) 1 % GEL Apply 4 g topically 4 (four) times daily. 5 Tube 11  . esomeprazole (NEXIUM) 40 MG capsule Take 40 mg by mouth daily before breakfast.    . ipratropium-albuterol (DUONEB) 0.5-2.5 (3) MG/3ML SOLN Take 3 mLs by nebulization every 8 (eight) hours as needed. 1 ampule 3 times a day for 3 days. 360 mL 3  . levothyroxine (SYNTHROID, LEVOTHROID) 50 MCG tablet TAKE 1 TABLET BY MOUTH  DAILY BEFORE BREAKFAST 90 tablet 3  . Multiple Vitamin (MULTIVITAMIN) tablet Take 1 tablet by mouth daily.    Marland Kitchen PARoxetine (PAXIL) 30 MG tablet TAKE 1 TABLET BY MOUTH  EVERY MORNING 90 tablet 3  . simvastatin (ZOCOR) 20 MG tablet Take 1 tablet (20 mg total) by mouth at bedtime. 90 tablet 3   Current  Facility-Administered Medications on File Prior to Visit  Medication Dose Route Frequency Provider Last Rate Last Dose  . denosumab (PROLIA) injection 60 mg  60 mg Subcutaneous Q6 months Lucille Passy, MD   60 mg at 12/13/15 1422    BP 118/68   Pulse 87   Temp 98.1 F (36.7 C) (Oral)   Ht 5' 6.5" (1.689 m)   Wt 141 lb 1.9 oz (64 kg)   SpO2 96%   BMI 22.44 kg/m    Objective:   Physical Exam  Constitutional: She appears well-nourished.  Cardiovascular: Normal rate.   Pulmonary/Chest: Effort normal.  Neurological:  No decreased sensation  Skin: Skin is warm and dry.  Laceration to left 4th digit, anterior side, no nail involvement. No erythema, drainage.          Assessment & Plan:  Laceration:  Located to right 4th digit that occurred 3 days ago. Using hedge trimmers, no recent Tetanus on file.  Td provided today. Discussed home care instructions, dressing applied. Follow up PRN.  Sheral Flow, NP

## 2016-04-17 NOTE — Progress Notes (Signed)
Pre visit review using our clinic review tool, if applicable. No additional management support is needed unless otherwise documented below in the visit note. 

## 2016-05-07 ENCOUNTER — Telehealth: Payer: Self-pay | Admitting: *Deleted

## 2016-05-07 DIAGNOSIS — M810 Age-related osteoporosis without current pathological fracture: Secondary | ICD-10-CM

## 2016-05-07 NOTE — Telephone Encounter (Signed)
Left message to call office

## 2016-05-07 NOTE — Telephone Encounter (Signed)
Verification of benefits have been processed and an approval has been received for pts prolia injection. Pts estimated cost are appx $0. This is only an estimate and cannot be confirmed until benefits are paid. Please advise pt and schedule if needed. If scheduled, once the injection is received, pls contact me back with the date it was received so that I am able to update prolia folder. thanks  

## 2016-05-07 NOTE — Telephone Encounter (Signed)
Ok to schedule CA lab

## 2016-05-08 NOTE — Telephone Encounter (Signed)
Patient returned Shannon's call.  Patient can be reached at [phone].

## 2016-05-09 NOTE — Telephone Encounter (Signed)
Order placed

## 2016-05-09 NOTE — Telephone Encounter (Signed)
Spoke to pt. She is schedule for 05-11-16 for Calcium lab. Dr Deborra Medina, please put in a future order for her calcium lab...thanks.

## 2016-05-10 ENCOUNTER — Institutional Professional Consult (permissible substitution): Payer: Medicare Other | Admitting: Internal Medicine

## 2016-05-11 ENCOUNTER — Other Ambulatory Visit (INDEPENDENT_AMBULATORY_CARE_PROVIDER_SITE_OTHER): Payer: Medicare Other

## 2016-05-11 ENCOUNTER — Ambulatory Visit (INDEPENDENT_AMBULATORY_CARE_PROVIDER_SITE_OTHER): Payer: Medicare Other | Admitting: Internal Medicine

## 2016-05-11 ENCOUNTER — Encounter: Payer: Self-pay | Admitting: Internal Medicine

## 2016-05-11 VITALS — BP 106/70 | HR 89 | Ht 66.5 in | Wt 142.0 lb

## 2016-05-11 DIAGNOSIS — J4550 Severe persistent asthma, uncomplicated: Secondary | ICD-10-CM

## 2016-05-11 DIAGNOSIS — M810 Age-related osteoporosis without current pathological fracture: Secondary | ICD-10-CM | POA: Diagnosis not present

## 2016-05-11 LAB — CBC WITH DIFFERENTIAL/PLATELET
BASOS ABS: 0 10*3/uL (ref 0.0–0.1)
Basophils Relative: 0.5 % (ref 0.0–3.0)
EOS ABS: 0.2 10*3/uL (ref 0.0–0.7)
Eosinophils Relative: 4.5 % (ref 0.0–5.0)
HCT: 42.2 % (ref 36.0–46.0)
Hemoglobin: 14.3 g/dL (ref 12.0–15.0)
LYMPHS ABS: 1.7 10*3/uL (ref 0.7–4.0)
Lymphocytes Relative: 30.5 % (ref 12.0–46.0)
MCHC: 33.9 g/dL (ref 30.0–36.0)
MCV: 90 fl (ref 78.0–100.0)
MONO ABS: 0.6 10*3/uL (ref 0.1–1.0)
Monocytes Relative: 10.2 % (ref 3.0–12.0)
NEUTROS ABS: 3 10*3/uL (ref 1.4–7.7)
NEUTROS PCT: 54.3 % (ref 43.0–77.0)
Platelets: 240 10*3/uL (ref 150.0–400.0)
RBC: 4.69 Mil/uL (ref 3.87–5.11)
RDW: 13.6 % (ref 11.5–15.5)
WBC: 5.5 10*3/uL (ref 4.0–10.5)

## 2016-05-11 LAB — CALCIUM: CALCIUM: 9 mg/dL (ref 8.4–10.5)

## 2016-05-11 MED ORDER — BUDESONIDE-FORMOTEROL FUMARATE 160-4.5 MCG/ACT IN AERO: 2.0000 | INHALATION_SPRAY | Freq: Two times a day (BID) | RESPIRATORY_TRACT | 0 refills | Status: DC

## 2016-05-11 NOTE — Progress Notes (Signed)
Subjective:     Patient ID: Dorothy Church, female   DOB: 11/14/1935,    MRN: [id number]  HPI  66  yowf  Never smoker born at 4lb as a twin but close to term with severe chronic asthma  On prn inhalers but feels she never had nl ex tol vs peers due to doe  assoc with seasonal rhinitis improved until 2013 when moved to [place] with [place] [facility] "positive for everything"  referred to pulmonary clinic 05/11/2016 by Dr  Stevenson Clinch   05/11/2016 1st [facility] office visit/ Dorothy Church  maint on symb160 (very poor hfa) and duoneb   Chief Complaint  Patient presents with  . Pulmonary Consult    Former patient of Dr Stevenson Clinch here to est care for COPD. Her breathing is at baseline today. She uses albuterol inhaler 2 x per wk on average and rarely uses neb.   doe x  MMRC3 = can't walk 100 yards even at a slow pace at a flat grade s stopping due to sob   Sleep cpap x around early 2000s sleeps well s noct or early am flares Does exp overt hb  No obvious day to day or daytime variability or assoc excess/ purulent sputum or mucus plugs or hemoptysis or cp or chest tightness, subjective wheeze or overt sinus  symptoms. No unusual exp hx or h/o childhood pna/ asthma or knowledge of premature birth.  Sleeping ok without nocturnal  or early am exacerbation  of respiratory  c/o's or need for noct saba. Also denies any obvious fluctuation of symptoms with weather or environmental changes or other aggravating or alleviating factors except as outlined above   Current Medications, Allergies, Complete Past Medical History, Past Surgical History, Family History, and Social History were reviewed in [employer] record.  ROS  The following are not active complaints unless bolded sore throat, dysphagia, dental problems, itching, sneezing,  nasal congestion or excess/ purulent secretions, ear ache,   fever, chills, sweats, unintended wt loss, classically pleuritic or exertional cp,  orthopnea  pnd or leg swelling, presyncope, palpitations, abdominal pain, anorexia, nausea, vomiting, diarrhea  or change in bowel or bladder habits, change in stools or urine, dysuria,hematuria,  rash, arthralgias, visual complaints, headache, numbness, weakness or ataxia or problems with walking or coordination,  change in mood/affect or memory.          Review of Systems     Objective:   Physical Exam    amb wf nad   Wt Readings from Last 3 Encounters:  05/11/16 142 lb (64.4 kg)  04/17/16 141 lb 1.9 oz (64 kg)  01/19/16 142 lb (64.4 kg)    Vital signs reviewed  - Note on arrival 02 sats  100% on RA     HEENT: nl dentition, turbinates bilaterally, and oropharynx. Nl external ear canals without cough reflex   NECK :  without JVD/Nodes/TM/ nl carotid upstrokes bilaterally   LUNGS: no acc muscle use,  Nl contour chest which is clear to A and P bilaterally without cough on insp or exp maneuvers   CV:  RRR  no s3 or murmur or increase in P2, and no edema   ABD:  soft and nontender with nl inspiratory excursion in the supine position. No bruits or organomegaly appreciated, bowel sounds nl  MS:  Nl gait/ ext warm without deformities, calf tenderness, cyanosis or clubbing No obvious joint restrictions   SKIN: warm and dry without lesions    NEURO:  alert, approp, nl sensorium with  no motor or cerebellar deficits apparent.      I personally reviewed images and agree with radiology impression as follows:  CT w contrast Chest  05/31/14  1.  No acute process in the chest. 2. Mild centrilobular emphysema. 3. Small hiatal hernia. Esophageal air fluid level suggests dysmotility or gastroesophageal reflux.   Assessment:

## 2016-05-11 NOTE — Patient Instructions (Addendum)
Plan A = Automatic = Symbicort 160 Take 2 puffs first thing in am and then another 2 puffs about 12 hours later.   Work on inhaler technique:  relax and gently blow all the way out then take a nice smooth deep breath back in, triggering the inhaler at same time you start breathing in.  Hold for up to 5 seconds if you can. Blow out thru nose. Rinse and gargle with water when done  Plan B = Backup Only use your albuterol as a rescue medication to be used if you can't catch your breath by resting or doing a relaxed purse lip breathing pattern.  - The less you use it, the better it will work when you need it. - Ok to use the inhaler up to 2 puffs  every 4 hours if you must but call for appointment if use goes up over your usual need - Don't leave home without it !!  (think of it like the spare tire for your car)   Plan C = Crisis - only use your albuterol nebulizer if you first try Plan B and it fails to help > ok to use the nebulizer up to every 4 hours but if start needing it regularly call for immediate appointment    Please remember to go to the lab   department downstairs in the basement  for your tests - we will call you with the results when they are available.  Please schedule a follow up visit in 3 months but call sooner if needed with your inhalers/spacer in hand if you are still using it  Add cxr needed on return

## 2016-05-12 ENCOUNTER — Encounter: Payer: Self-pay | Admitting: Internal Medicine

## 2016-05-12 NOTE — Assessment & Plan Note (Addendum)
PFT's  08/16/14 FEV1 1.46 (62 % ) ratio 62  p 56  % improvement from saba p ?  prior to study with DLCO  66 % corrects to 82  % for alv volume   - Allergy profile 05/11/2016 >  Eos 0.2 /  IgE - 05/11/2016  After extensive coaching HFA effectiveness =    75% from baseline of 25%   She wasn't actually inhaling much of the symbicort and I suspect this is a case of chronically undertreated asthma and not copd though CT scan and birth wt of 4 lb suggest she could have very mild emphysema, that's not the part we need to focus on here.  Will check allergy profile and consider restarting gerd rx if not well controlled on adequate rx with symbicort and bring back in  3 m for pfts and cxr   Total time devoted to counseling  > 50 % of initial 60 min office visit with pt not previously known to me:  review case with pt/ discussion of options/alternatives/ personally creating written customized instructions  in presence of pt  then going over those specific  Instructions directly with the pt including how to use all of the meds but in particular covering each new medication in detail and the difference between the maintenance= "automatic" meds and the prns using an action plan format for the latter (If this problem/symptom => do that organization reading Left to right).  Please see AVS from this visit for a full list of these instructions which I personally wrote for this pt and  are unique to this visit.

## 2016-05-14 LAB — RESPIRATORY ALLERGY PROFILE REGION II ~~LOC~~
ALLERGEN, D PTERNOYSSINUS, D1: 12 kU/L — AB
Allergen, Cedar tree, t12: <0.1 kU/L
Allergen, Comm Silver Birch, t9: <0.1 kU/L
Allergen, Cottonwood, t14: <0.1 kU/L
Allergen, Mouse Urine Protein, e78: <0.1 kU/L
Bermuda Grass: <0.1 kU/L
CAT DANDER: 0.28 kU/L — AB
COMMON RAGWEED: 0.28 kU/L — AB
Cockroach: <0.1 kU/L
D. FARINAE: 14.9 kU/L — AB
Dog Dander: 0.99 kU/L — ABNORMAL HIGH
Elm IgE: <0.1 kU/L
IgE (Immunoglobulin E), Serum: 43 kU/L (ref <115)

## 2016-05-14 NOTE — Progress Notes (Signed)
Spoke with pt and notified of results per Dr. Wert. Pt verbalized understanding and denied any questions. 

## 2016-05-23 ENCOUNTER — Ambulatory Visit: Payer: Medicare Other

## 2016-06-11 DIAGNOSIS — K2270 Barrett's esophagus without dysplasia: Secondary | ICD-10-CM | POA: Diagnosis not present

## 2016-06-19 ENCOUNTER — Encounter: Payer: Self-pay | Admitting: Family Medicine

## 2016-06-20 ENCOUNTER — Ambulatory Visit: Payer: Medicare Other

## 2016-06-21 DIAGNOSIS — M545 Low back pain: Secondary | ICD-10-CM | POA: Diagnosis not present

## 2016-06-21 DIAGNOSIS — G8929 Other chronic pain: Secondary | ICD-10-CM | POA: Diagnosis not present

## 2016-06-21 DIAGNOSIS — M5441 Lumbago with sciatica, right side: Secondary | ICD-10-CM | POA: Diagnosis not present

## 2016-06-21 DIAGNOSIS — M5442 Lumbago with sciatica, left side: Secondary | ICD-10-CM | POA: Diagnosis not present

## 2016-07-09 ENCOUNTER — Encounter: Payer: Self-pay | Admitting: Family Medicine

## 2016-07-22 ENCOUNTER — Encounter: Payer: Self-pay | Admitting: Family Medicine

## 2016-07-23 ENCOUNTER — Other Ambulatory Visit: Payer: Self-pay | Admitting: Family Medicine

## 2016-07-23 MED ORDER — PREDNISONE 5 MG PO TABS
5.0000 mg | ORAL_TABLET | Freq: Every day | ORAL | 3 refills | Status: DC
Start: 2016-07-23 — End: 2016-10-31

## 2016-08-07 ENCOUNTER — Encounter: Payer: Self-pay | Admitting: Family Medicine

## 2016-08-09 ENCOUNTER — Ambulatory Visit (INDEPENDENT_AMBULATORY_CARE_PROVIDER_SITE_OTHER): Payer: Medicare Other | Admitting: *Deleted

## 2016-08-09 DIAGNOSIS — M810 Age-related osteoporosis without current pathological fracture: Secondary | ICD-10-CM

## 2016-08-09 MED ORDER — DENOSUMAB 60 MG/ML ~~LOC~~ SOLN
60.0000 mg | Freq: Once | SUBCUTANEOUS | Status: AC
  Administered 2016-08-09: 60 mg via SUBCUTANEOUS

## 2016-08-13 ENCOUNTER — Ambulatory Visit: Payer: Medicare Other | Admitting: Internal Medicine

## 2016-08-24 ENCOUNTER — Encounter: Payer: Self-pay | Admitting: Family Medicine

## 2016-08-27 DIAGNOSIS — H40001 Preglaucoma, unspecified, right eye: Secondary | ICD-10-CM | POA: Diagnosis not present

## 2016-09-11 ENCOUNTER — Other Ambulatory Visit: Payer: Self-pay

## 2016-09-17 ENCOUNTER — Encounter: Payer: Self-pay | Admitting: Internal Medicine

## 2016-09-17 ENCOUNTER — Ambulatory Visit (INDEPENDENT_AMBULATORY_CARE_PROVIDER_SITE_OTHER): Payer: Medicare Other | Admitting: Internal Medicine

## 2016-09-17 VITALS — BP 126/84 | HR 77 | Ht 66.5 in | Wt 143.0 lb

## 2016-09-17 DIAGNOSIS — G4733 Obstructive sleep apnea (adult) (pediatric): Secondary | ICD-10-CM

## 2016-09-17 MED ORDER — TIOTROPIUM BROMIDE MONOHYDRATE 1.25 MCG/ACT IN AERS: 1.2500 | INHALATION_SPRAY | Freq: Two times a day (BID) | RESPIRATORY_TRACT | 12 refills | Status: DC

## 2016-09-17 NOTE — Progress Notes (Signed)
[facility] Consultation      MRN# [id number] Dorothy Church 1935-06-25   CC: Chief Complaint  Patient presents with  . Advice Only    SOB w/activity: chest tightness:       Brief History: She is a pleasant 81 year old female past medical history of asthma  Patient states she had asthma during childhood where she had frequent hospitalizations but no intubations. Patient states her breathing is trouble with certain triggers such as mold, dust, damp areas. She was told about 45 years ago she had COPD and was also placed on Symbicort at that time along with as needed albuterol. She has a history of obstructive sleep apnea diagnosed 8 years ago and she's currently on CPAP; she doesn't recall her CPAP settings. Patient states she never smoked tobacco had any secondhand exposure.  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   ROV 06/17/14 Patient presents today for a follow up visit, doing well today. Back to using symbicort. Had a CT Chest since last visit. Only using rescue inhaler about 1 time a week since last visit.   Plan: - pulmonary function testing and 6 minute walk test prior to follow up - Spiriva Respimat (2.18mcg) - 2 puff once a day - Patient given a sample - if having improvement by week 2, then patient will call us and we can fill this prescription - continue with Symbicort - continue with albuterol rescue inhaler - 2puff every 3-4 hours as needed for shortness of breath\wheezing\recurrent cough - a followup visit will consider further evaluation with GI referral if patient is still symptomatic with shortness of breath and cough  ROV 08/16/14: Patient presents for follow up visit of COPD along with PFTs and 6 MWT  Did not call in refill for spiriva, but stated that the 2 week trial was helpful Using Symbicort as directed.  Patient states overall she is doing well except  she has shortness of breath mainly with exertion. No cough, no wheeze, no chest tightness. Plan: - Spiriva Respimat (2.30mcg) - 2 puff once a day - Patient given a sample - continue with Symbicort - continue with albuterol rescue inhaler - 2puff every 3-4 hours as needed for shortness of breath\wheezing\recurrent cough - Referral to pulmonary rehabilitation  ROV 11/10/14 Patient presents today for a follow up visit.  Still with some dyspnea, but states that it has been improving. Currently on symbicort and spiriva.  States that she is in the insurance lapse, and her inhalers are very expensive.  Currently attending pulm rehab, attended 3 weeks so far.   ONO in 09/2014 showed that she does not require supplemental O2 at night.  Plan -continue with Symbicort and Spiriva, albuterol as needed, exercise as tolerated.   ROV 09/16/16 She presents today for follow-up visit of her asthma and OSA. no URIs this year, no need for antibiotics and extra use of inhalers. Patient states that her shortness of breath and dyspnea on exertion are stable however she does feel intermittent increased shortness of breath  She takes Symbicort twice daily however and midday she feels more short winded with intermittent wheezing and has some increased phlegm production I have advised to reassess Spiriva Respimat inhalers and also to restart her antihistamines   Last sleep study was done 2010, sleep titration study showed severe sleep apnea with optimal CPAP setting of 8. She has been completely compliant with her CPAP machine  how last download per our records was October 22 December 2015, with a compliance of 93%. Per her compliance report looks like she is on CPAP, minimum setting 5 cm, maximum setting 20 cm. 95th percentile on AutoPap is 14.0. Patient is looking to change companies  Medication:    Current Outpatient Prescriptions:  .  acetaminophen (TYLENOL) 325 MG tablet, Take 650 mg by mouth every 6 (six) hours as  needed., Disp: , Rfl:  .  albuterol (VENTOLIN HFA) 108 (90 BASE) MCG/ACT inhaler, Inhale 2 puffs into the lungs every 6 (six) hours as needed for wheezing., Disp: 18 g, Rfl: 5 .  budesonide-formoterol (SYMBICORT) 160-4.5 MCG/ACT inhaler, Inhale 2 puffs into the lungs 2 (two) times daily., Disp: 3 Inhaler, Rfl: 3 .  calcium carbonate (OS-CAL) 600 MG TABS, Take 600 mg by mouth daily., Disp: , Rfl:  .  diclofenac sodium (VOLTAREN) 1 % GEL, Apply 4 g topically 4 (four) times daily., Disp: 5 Tube, Rfl: 11 .  ipratropium-albuterol (DUONEB) 0.5-2.5 (3) MG/3ML SOLN, Take 3 mLs by nebulization every 8 (eight) hours as needed. 1 ampule 3 times a day for 3 days., Disp: 360 mL, Rfl: 3 .  levothyroxine (SYNTHROID, LEVOTHROID) 50 MCG tablet, TAKE 1 TABLET BY MOUTH  DAILY BEFORE BREAKFAST, Disp: 90 tablet, Rfl: 3 .  Multiple Vitamin (MULTIVITAMIN) tablet, Take 1 tablet by mouth daily., Disp: , Rfl:  .  PARoxetine (PAXIL) 30 MG tablet, TAKE 1 TABLET BY MOUTH  EVERY MORNING, Disp: 90 tablet, Rfl: 3 .  predniSONE (DELTASONE) 5 MG tablet, Take 1 tablet (5 mg total) by mouth daily with breakfast., Disp: 90 tablet, Rfl: 3 .  simvastatin (ZOCOR) 20 MG tablet, Take 1 tablet (20 mg total) by mouth at bedtime., Disp: 90 tablet, Rfl: 3  Current Facility-Administered Medications:  .  denosumab (PROLIA) injection 60 mg, 60 mg, Subcutaneous, Q6 months, Deborra Medina, Talia M, MD, 60 mg at 12/13/15 1422    Review of Systems  Constitutional: Negative for chills and fever.  Eyes: Negative for blurred vision.  Respiratory: Negative for cough, hemoptysis, sputum production and shortness of breath.   Cardiovascular: Negative for chest pain and palpitations.  Gastrointestinal: Negative for heartburn.  Genitourinary: Negative for dysuria.  Musculoskeletal: Negative for myalgias.  Skin: Negative for itching and rash.  Neurological: Negative for dizziness and headaches.  Endo/Heme/Allergies: Does not bruise/bleed easily.    Psychiatric/Behavioral: Negative for depression.      Allergies:  Amoxicillin; Cefzil [cefprozil]; Cephalexin; Propoxyphene; Relafen [nabumetone]; and Talwin [pentazocine]  Physical Examination:  VS: Ht 5' 6.5" (1.689 m)   Wt 143 lb (64.9 kg)   BMI 22.74 kg/m   General Appearance: No distress  HEENT: PERRLA, no other lesions noticed Pulmonary:normal breath sounds., diaphragmatic excursion normal.No wheezing, No rales   Cardiovascular:  Normal S1,S2.  No m/r/g.     Abdomen:Exam: Benign, Soft, non-tender, No masses  Skin:   warm, no rashes, no ecchymosis  Extremities: normal, no cyanosis, clubbing, warm with normal capillary refill.     Assessment and Plan: 81 yo F with PMHx of mild persistent Asthma and OSA presenting for OSA follow-up in the setting of allergic rhinitis  Obstructive sleep apnea on CPAP Patient with severe sleep apnea, with 92% compliance on AutoPap. 90 for percentile of AutoPap 14 cm of water This is previous compliance report   Plan: -Continue with AutoPap as stated above. Patient will need to change companies in order to get most recent compliance report  #2 chronic persistent asthma Patient with  prolonged asthma and is a never smoker with no significant exposure to 2nd hand smoke. PFTs with moderate obstruction, with significant reversibility.  I believe with her prolonged asthma, she is most likely starting to have more fixed airway (which can be seen in prolonged asthmatic),  -Will add Spiriva Respimat 1.25 and continue symbicort - cont with exercise - albuterol as needed.   #3 allergic rhinitis Restart Claritin  Patient satisfied with Plan of action and management. All questions answered Follow-up in 3 months   Erlean Mealor Patricia Pesa, M.D.  [facility]  Medical Director [place] Director [facility] Cardio-Pulmonary Department

## 2016-09-17 NOTE — Patient Instructions (Addendum)
Continue auto CPAP therapy as prescribed Continue inhalers as prescribed Restart Claritin Will prescribe Spiriva Respimat 2.5

## 2016-10-02 ENCOUNTER — Other Ambulatory Visit: Payer: Self-pay | Admitting: Family Medicine

## 2016-10-18 ENCOUNTER — Ambulatory Visit: Payer: Medicare Other | Admitting: Anesthesiology

## 2016-10-18 ENCOUNTER — Ambulatory Visit
Admission: RE | Admit: 2016-10-18 | Discharge: 2016-10-18 | Disposition: A | Discharge status: Home / Self Care | Payer: Medicare Other | Source: Ambulatory Visit | Attending: Gastroenterology | Admitting: Gastroenterology

## 2016-10-18 ENCOUNTER — Encounter
Admission: RE | Disposition: A | Discharge status: Home / Self Care | Payer: Self-pay | Source: Ambulatory Visit | Attending: Gastroenterology

## 2016-10-18 DIAGNOSIS — G4733 Obstructive sleep apnea (adult) (pediatric): Secondary | ICD-10-CM | POA: Insufficient documentation

## 2016-10-18 DIAGNOSIS — J449 Chronic obstructive pulmonary disease, unspecified: Secondary | ICD-10-CM | POA: Insufficient documentation

## 2016-10-18 DIAGNOSIS — Z880 Allergy status to penicillin: Secondary | ICD-10-CM | POA: Diagnosis not present

## 2016-10-18 DIAGNOSIS — Z888 Allergy status to other drugs, medicaments and biological substances status: Secondary | ICD-10-CM | POA: Insufficient documentation

## 2016-10-18 DIAGNOSIS — K228 Other specified diseases of esophagus: Secondary | ICD-10-CM | POA: Diagnosis not present

## 2016-10-18 DIAGNOSIS — K210 Gastro-esophageal reflux disease with esophagitis: Secondary | ICD-10-CM | POA: Diagnosis not present

## 2016-10-18 DIAGNOSIS — K2970 Gastritis, unspecified, without bleeding: Secondary | ICD-10-CM | POA: Diagnosis not present

## 2016-10-18 DIAGNOSIS — K2960 Other gastritis without bleeding: Secondary | ICD-10-CM | POA: Diagnosis not present

## 2016-10-18 DIAGNOSIS — I358 Other nonrheumatic aortic valve disorders: Secondary | ICD-10-CM | POA: Insufficient documentation

## 2016-10-18 DIAGNOSIS — M1990 Unspecified osteoarthritis, unspecified site: Secondary | ICD-10-CM | POA: Diagnosis not present

## 2016-10-18 DIAGNOSIS — E7800 Pure hypercholesterolemia, unspecified: Secondary | ICD-10-CM | POA: Diagnosis not present

## 2016-10-18 DIAGNOSIS — M810 Age-related osteoporosis without current pathological fracture: Secondary | ICD-10-CM | POA: Diagnosis not present

## 2016-10-18 DIAGNOSIS — E039 Hypothyroidism, unspecified: Secondary | ICD-10-CM | POA: Insufficient documentation

## 2016-10-18 DIAGNOSIS — K2930 Chronic superficial gastritis without bleeding: Secondary | ICD-10-CM | POA: Diagnosis not present

## 2016-10-18 DIAGNOSIS — K449 Diaphragmatic hernia without obstruction or gangrene: Secondary | ICD-10-CM | POA: Insufficient documentation

## 2016-10-18 DIAGNOSIS — E559 Vitamin D deficiency, unspecified: Secondary | ICD-10-CM | POA: Insufficient documentation

## 2016-10-18 DIAGNOSIS — Z79899 Other long term (current) drug therapy: Secondary | ICD-10-CM | POA: Diagnosis not present

## 2016-10-18 DIAGNOSIS — K259 Gastric ulcer, unspecified as acute or chronic, without hemorrhage or perforation: Secondary | ICD-10-CM | POA: Diagnosis not present

## 2016-10-18 DIAGNOSIS — K2270 Barrett's esophagus without dysplasia: Secondary | ICD-10-CM | POA: Diagnosis not present

## 2016-10-18 HISTORY — PX: ESOPHAGOGASTRODUODENOSCOPY (EGD) WITH PROPOFOL: SHX5813

## 2016-10-18 SURGERY — ESOPHAGOGASTRODUODENOSCOPY (EGD) WITH PROPOFOL
Anesthesia: General

## 2016-10-18 MED ORDER — SODIUM CHLORIDE 0.9 % IV SOLN: INTRAVENOUS | Status: DC

## 2016-10-18 MED ORDER — PROPOFOL 10 MG/ML IV BOLUS
INTRAVENOUS | Status: AC
  Filled 2016-10-18: qty 20

## 2016-10-18 MED ORDER — FENTANYL CITRATE (PF) 100 MCG/2ML IJ SOLN
INTRAMUSCULAR | Status: AC
  Filled 2016-10-18: qty 2

## 2016-10-18 MED ORDER — PROPOFOL 10 MG/ML IV BOLUS
INTRAVENOUS | Status: DC | PRN
  Administered 2016-10-18: 100 mg via INTRAVENOUS

## 2016-10-18 MED ORDER — LIDOCAINE 2% (20 MG/ML) 5 ML SYRINGE
INTRAMUSCULAR | Status: DC | PRN
  Administered 2016-10-18: 30 mg via INTRAVENOUS

## 2016-10-18 MED ORDER — PHENYLEPHRINE HCL 10 MG/ML IJ SOLN
INTRAMUSCULAR | Status: DC | PRN
  Administered 2016-10-18: 100 ug via INTRAVENOUS

## 2016-10-18 MED ORDER — PROPOFOL 500 MG/50ML IV EMUL
INTRAVENOUS | Status: AC
  Filled 2016-10-18: qty 50

## 2016-10-18 MED ORDER — PROPOFOL 500 MG/50ML IV EMUL
INTRAVENOUS | Status: DC | PRN
  Administered 2016-10-18: 140 ug/kg/min via INTRAVENOUS

## 2016-10-18 MED ORDER — FENTANYL CITRATE (PF) 100 MCG/2ML IJ SOLN
INTRAMUSCULAR | Status: DC | PRN
  Administered 2016-10-18 (×2): 50 ug via INTRAVENOUS

## 2016-10-18 MED ORDER — SODIUM CHLORIDE 0.9 % IV SOLN
INTRAVENOUS | Status: DC
  Administered 2016-10-18: 13:00:00 via INTRAVENOUS

## 2016-10-18 NOTE — H&P (Signed)
Outpatient short stay form Pre-procedure 10/18/2016 1:01 PM Lollie Sails MD  Primary Physician: Dr. Arnette Norris  Reason for visit:  EGD  History of present illness:  Patient is a 81 year old female presenting today as above. She has a personal history of Barrett's esophagus, a small focus found on her last biopsies 12/14/2014. She has had issues with reflux and is currently taking dexilant daily. She does have some evening reflux and heartburn type symptoms. We discussed this today and I did suggest using some Tums as needed. She takes no aspirin or blood thinning agents.    Current Facility-Administered Medications:  .  0.9 %  sodium chloride infusion, , Intravenous, Continuous, Lollie Sails, MD  Facility-Administered Medications Prior to Admission  Medication Dose Route Frequency Provider Last Rate Last Dose  . denosumab (PROLIA) injection 60 mg  60 mg Subcutaneous Q6 months Lucille Passy, MD   60 mg at 12/13/15 1422   Prescriptions Prior to Admission  Medication Sig Dispense Refill Last Dose  . acetaminophen (TYLENOL) 325 MG tablet Take 650 mg by mouth every 6 (six) hours as needed.   10/17/2016 at Unknown time  . budesonide-formoterol (SYMBICORT) 160-4.5 MCG/ACT inhaler Inhale 2 puffs into the lungs 2 (two) times daily. 3 Inhaler 3 10/17/2016 at Unknown time  . calcium carbonate (OS-CAL) 600 MG TABS Take 600 mg by mouth daily.   10/17/2016 at Unknown time  . dexlansoprazole (DEXILANT) 60 MG capsule Take 60 mg by mouth daily.   10/17/2016 at Unknown time  . diclofenac sodium (VOLTAREN) 1 % GEL Apply 4 g topically 4 (four) times daily. 5 Tube 11 Past Week at Unknown time  . levothyroxine (SYNTHROID, LEVOTHROID) 50 MCG tablet TAKE 1 TABLET BY MOUTH  DAILY BEFORE BREAKFAST 90 tablet 3 10/17/2016 at Unknown time  . Multiple Vitamin (MULTIVITAMIN) tablet Take 1 tablet by mouth daily.   10/17/2016 at Unknown time  . PARoxetine (PAXIL) 30 MG tablet TAKE 1 TABLET BY MOUTH  EVERY MORNING 90  tablet 3 10/17/2016 at Unknown time  . predniSONE (DELTASONE) 5 MG tablet Take 1 tablet (5 mg total) by mouth daily with breakfast. 90 tablet 3 10/17/2016 at Unknown time  . simvastatin (ZOCOR) 20 MG tablet TAKE 1 TABLET BY MOUTH AT  BEDTIME 90 tablet 1 10/17/2016 at Unknown time  . Tiotropium Bromide Monohydrate (SPIRIVA RESPIMAT) 1.25 MCG/ACT AERS Inhale 1.25 Act into the lungs 2 (two) times daily. 1 Inhaler 12 10/17/2016 at Unknown time  . albuterol (VENTOLIN HFA) 108 (90 BASE) MCG/ACT inhaler Inhale 2 puffs into the lungs every 6 (six) hours as needed for wheezing. 18 g 5 10/16/2016  . ipratropium-albuterol (DUONEB) 0.5-2.5 (3) MG/3ML SOLN Take 3 mLs by nebulization every 8 (eight) hours as needed. 1 ampule 3 times a day for 3 days. (Patient not taking: Reported on 10/18/2016) 360 mL 3 Not Taking at Unknown time     Allergies  Allergen Reactions  . Amoxicillin Other (See Comments)    Pencillins  . Cefzil [Cefprozil]   . Cephalexin   . Propoxyphene     Other reaction(s): Unknown  . Relafen [Nabumetone]   . Talwin [Pentazocine]      Past Medical History:  Diagnosis Date  . Aortic valve disorders   . Arthritis   . Barrett's esophagus   . COPD (chronic obstructive pulmonary disease) ([place])   . Hypothyroidism   . Impaired fasting glucose   . Intrinsic asthma, unspecified   . Obstructive sleep apnea (adult) (pediatric)   .  Osteoporosis   . Pure hypercholesterolemia   . Syncope and collapse   . Vitamin D deficiency     Review of systems:      Physical Exam    Heart and lungs: Regular rate and rhythm without rub or gallop, lungs are bilaterally clear    HEENT: Normocephalic atraumatic eyes are anicteric    Other:     Pertinant exam for procedure: Soft nontender nondistended bowel sounds positive normoactive    Planned proceedures: EGD and indicated procedures. I have discussed the risks benefits and complications of procedures to include not limited to bleeding, infection,  perforation and the risk of sedation and the patient wishes to proceed.    Lollie Sails, MD Gastroenterology 10/18/2016  1:01 PM

## 2016-10-18 NOTE — Op Note (Signed)
[facility] Patient Name: Dorothy Church Procedure Date: 10/18/2016 1:03 PM MRN: [id number] Account #: [id number] Date of Birth: 1935-03-29 Admit Type: Outpatient Age: 81 Room: [facility] ENDO ROOM 1 Gender: Female Note Status: Finalized Procedure:            Upper GI endoscopy Indications:          Follow-up of Barrett's esophagus Providers:            Lollie Sails, MD Referring MD:         Marciano Sequin. Deborra Medina (Referring MD) Medicines:            Monitored Anesthesia Care Complications:        No immediate complications. Procedure:            Pre-Anesthesia Assessment:                       - ASA Grade Assessment: III - A patient with severe                        systemic disease.                       After obtaining informed consent, the endoscope was                        passed under direct vision. Throughout the procedure,                        the patient's blood pressure, pulse, and oxygen                        saturations were monitored continuously. The Endoscope                        was introduced through the mouth, and advanced to the                        third part of duodenum. The upper GI endoscopy was                        accomplished without difficulty. The patient tolerated                        the procedure well. Findings:      There were esophageal mucosal changes secondary to established       short-segment Barrett's disease present in the distal esophagus. The       maximum longitudinal extent of these mucosal changes was 0.6 cm in       length. Mucosa was biopsied with a cold forceps for histology in 4       quadrants at 37 cm from the incisors. One specimen bottle was sent to       pathology.      The middle third of the esophagus and lower third of the esophagus were       mildly tortuous. Prominant venous lakes noted in the upper third of the       esophagus, there is no evidence of distal esophageal varices.    A small hiatal hernia was present.      Patchy mild inflammation characterized by erosions and erythema was  found in the gastric antrum. Biopsies were taken with a cold forceps for       histology.      The examined duodenum was normal. Impression:           - Esophageal mucosal changes secondary to established                        short-segment Barrett's disease. Biopsied.                       - Tortuous esophagus.                       - Small hiatal hernia.                       - Gastritis. Biopsied.                       - Normal examined duodenum. Recommendation:       - Continue present medications.                       - Await pathology results.                       - Telephone GI clinic for pathology results in 1 week. Procedure Code(s):    --- Professional ---                       [phone], Esophagogastroduodenoscopy, flexible, transoral;                        with biopsy, single or multiple Diagnosis Code(s):    --- Professional ---                       K22.70, Barrett's esophagus without dysplasia                       Q39.9, Congenital malformation of esophagus, unspecified                       K44.9, Diaphragmatic hernia without obstruction or                        gangrene                       K29.70, Gastritis, unspecified, without bleeding CPT copyright 2016 American Medical Association. All rights reserved. The codes documented in this report are preliminary and upon coder review may  be revised to meet current compliance requirements. Lollie Sails, MD 10/18/2016 1:31:23 PM This report has been signed electronically. Number of Addenda: 0 Note Initiated On: 10/18/2016 1:03 PM      [facility]

## 2016-10-18 NOTE — Anesthesia Post-op Follow-up Note (Signed)
Anesthesia QCDR form completed.        

## 2016-10-18 NOTE — Transfer of Care (Signed)
Immediate Anesthesia Transfer of Care Note  Patient: Dorothy Church  Procedure(s) Performed: Procedure(s): ESOPHAGOGASTRODUODENOSCOPY (EGD) WITH PROPOFOL (N/A)  Patient Location: PACU and Endoscopy Unit  Anesthesia Type:General  Level of Consciousness: sedated  Airway & Oxygen Therapy: Patient Spontanous Breathing and Patient connected to nasal cannula oxygen  Post-op Assessment: Report given to RN and Post -op Vital signs reviewed and stable  Post vital signs: Reviewed and stable  Last Vitals:  Vitals:   10/18/16 1245  BP: 119/64  Pulse: 84  Resp: 18  Temp: (!) 35.6 C  SpO2: 96%    Last Pain:  Vitals:   10/18/16 1245  TempSrc: Tympanic         Complications: No apparent anesthesia complications

## 2016-10-18 NOTE — Anesthesia Preprocedure Evaluation (Signed)
Anesthesia Evaluation  Patient identified by MRN, date of birth, ID band Patient awake    Reviewed: Allergy & Precautions, NPO status , Patient's Chart, lab work & pertinent test results  Airway Mallampati: III       Dental  (+) Teeth Intact   Pulmonary    breath sounds clear to auscultation + decreased breath sounds      Cardiovascular Exercise Tolerance: Good  Rhythm:Regular     Neuro/Psych negative neurological ROS  negative psych ROS   GI/Hepatic negative GI ROS, Neg liver ROS,   Endo/Other  Hypothyroidism   Renal/GU negative Renal ROS     Musculoskeletal   Abdominal Normal abdominal exam  (+)   Peds negative pediatric ROS (+)  Hematology negative hematology ROS (+)   Anesthesia Other Findings   Reproductive/Obstetrics                             Anesthesia Physical Anesthesia Plan  ASA: II  Anesthesia Plan: General   Post-op Pain Management:    Induction: Intravenous  PONV Risk Score and Plan: 0  Airway Management Planned: Natural Airway and Nasal Cannula  Additional Equipment:   Intra-op Plan:   Post-operative Plan:   Informed Consent: I have reviewed the patients History and Physical, chart, labs and discussed the procedure including the risks, benefits and alternatives for the proposed anesthesia with the patient or authorized representative who has indicated his/her understanding and acceptance.     Plan Discussed with: CRNA and Surgeon  Anesthesia Plan Comments:         Anesthesia Quick Evaluation

## 2016-10-18 NOTE — Anesthesia Postprocedure Evaluation (Signed)
Anesthesia Post Note  Patient: Dorothy Church  Procedure(s) Performed: Procedure(s) (LRB): ESOPHAGOGASTRODUODENOSCOPY (EGD) WITH PROPOFOL (N/A)  Patient location during evaluation: PACU Anesthesia Type: General Level of consciousness: awake Pain management: pain level controlled Vital Signs Assessment: post-procedure vital signs reviewed and stable Cardiovascular status: stable Anesthetic complications: no     Last Vitals:  Vitals:   10/18/16 1400 10/18/16 1403  BP: 111/63 111/63  Pulse: 74 67  Resp: 15 12  Temp:    SpO2: 97% 98%    Last Pain:  Vitals:   10/18/16 1245  TempSrc: Tympanic                 VAN STAVEREN,Haralambos Yeatts

## 2016-10-19 ENCOUNTER — Encounter: Payer: Self-pay | Admitting: Gastroenterology

## 2016-10-20 LAB — SURGICAL PATHOLOGY

## 2016-10-23 ENCOUNTER — Other Ambulatory Visit: Payer: Self-pay | Admitting: Family Medicine

## 2016-10-23 DIAGNOSIS — E032 Hypothyroidism due to medicaments and other exogenous substances: Secondary | ICD-10-CM

## 2016-10-23 DIAGNOSIS — E785 Hyperlipidemia, unspecified: Secondary | ICD-10-CM

## 2016-10-23 DIAGNOSIS — E559 Vitamin D deficiency, unspecified: Secondary | ICD-10-CM

## 2016-10-24 ENCOUNTER — Ambulatory Visit (INDEPENDENT_AMBULATORY_CARE_PROVIDER_SITE_OTHER): Payer: Medicare Other

## 2016-10-24 VITALS — BP 102/64 | HR 87 | Temp 98.0°F | Ht 66.5 in | Wt 146.5 lb

## 2016-10-24 DIAGNOSIS — E559 Vitamin D deficiency, unspecified: Secondary | ICD-10-CM | POA: Diagnosis not present

## 2016-10-24 DIAGNOSIS — E785 Hyperlipidemia, unspecified: Secondary | ICD-10-CM

## 2016-10-24 DIAGNOSIS — Z0000 Encounter for general adult medical examination without abnormal findings: Secondary | ICD-10-CM | POA: Diagnosis not present

## 2016-10-24 DIAGNOSIS — E032 Hypothyroidism due to medicaments and other exogenous substances: Secondary | ICD-10-CM

## 2016-10-24 DIAGNOSIS — Z23 Encounter for immunization: Secondary | ICD-10-CM

## 2016-10-24 LAB — LIPID PANEL
CHOL/HDL RATIO: 4
Cholesterol: 172 mg/dL (ref 0–200)
HDL: 48.2 mg/dL (ref >39.00)
LDL CALC: 109 mg/dL — AB (ref 0–99)
NonHDL: 124.1
TRIGLYCERIDES: 74 mg/dL (ref 0.0–149.0)
VLDL: 14.8 mg/dL (ref 0.0–40.0)

## 2016-10-24 LAB — COMPREHENSIVE METABOLIC PANEL
ALT: 11 U/L (ref 0–35)
AST: 15 U/L (ref 0–37)
Albumin: 4 g/dL (ref 3.5–5.2)
Alkaline Phosphatase: 37 U/L — ABNORMAL LOW (ref 39–117)
BUN: 12 mg/dL (ref 6–23)
CALCIUM: 8.7 mg/dL (ref 8.4–10.5)
CHLORIDE: 107 meq/L (ref 96–112)
CO2: 29 meq/L (ref 19–32)
Creatinine, Ser: 0.75 mg/dL (ref 0.40–1.20)
GFR: 79.25 mL/min (ref >60.00)
Glucose, Bld: 95 mg/dL (ref 70–99)
POTASSIUM: 4.3 meq/L (ref 3.5–5.1)
Sodium: 141 mEq/L (ref 135–145)
Total Bilirubin: 0.4 mg/dL (ref 0.2–1.2)
Total Protein: 6.1 g/dL (ref 6.0–8.3)

## 2016-10-24 LAB — TSH: TSH: 1.53 u[IU]/mL (ref 0.35–4.50)

## 2016-10-24 LAB — VITAMIN D 25 HYDROXY (VIT D DEFICIENCY, FRACTURES): VITD: 21.89 ng/mL — ABNORMAL LOW (ref 30.00–100.00)

## 2016-10-24 LAB — T4, FREE: FREE T4: 0.94 ng/dL (ref 0.60–1.60)

## 2016-10-24 NOTE — Progress Notes (Signed)
PCP notes:   Health maintenance:  Flu vaccine - administered  Abnormal screenings:   Hearing - failed Depression score: 5  Patient concerns:   None  Nurse concerns:  None  Next PCP appt:   10/31/16 @ 1215

## 2016-10-24 NOTE — Progress Notes (Signed)
I reviewed health advisor's note, was available for consultation, and agree with documentation and plan.  

## 2016-10-24 NOTE — Progress Notes (Signed)
Pre visit review using our clinic review tool, if applicable. No additional management support is needed unless otherwise documented below in the visit note. 

## 2016-10-24 NOTE — Patient Instructions (Signed)
Dorothy Church , Thank you for taking time to come for your Medicare Wellness Visit. I appreciate your ongoing commitment to your health goals. Please review the following plan we discussed and let me know if I can assist you in the future.   These are the goals we discussed: Goals    . Healthy Lifestyle          Starting 10/24/2016, I will continue to garden as weather permits, sleep for at least 6-8 hours daily , and drink at least 3-4 glasses of water daily.        This is a list of the screening recommended for you and due dates:  Health Maintenance  Topic Date Due  . Tetanus Vaccine  04/17/2026  . Flu Shot  Completed  . DEXA scan (bone density measurement)  Completed  . Pneumonia vaccines  Completed   Preventive Care for Adults  A healthy lifestyle and preventive care can promote health and wellness. Preventive health guidelines for adults include the following key practices.  . A routine yearly physical is a good way to check with your health care provider about your health and preventive screening. It is a chance to share any concerns and updates on your health and to receive a thorough exam.  . Visit your dentist for a routine exam and preventive care every 6 months. Brush your teeth twice a day and floss once a day. Good oral hygiene prevents tooth decay and gum disease.  . The frequency of eye exams is based on your age, health, family medical history, use  of contact lenses, and other factors. Follow your health care provider's ecommendations for frequency of eye exams.  . Eat a healthy diet. Foods like vegetables, fruits, whole grains, low-fat dairy products, and lean protein foods contain the nutrients you need without too many calories. Decrease your intake of foods high in solid fats, added sugars, and salt. Eat the right amount of calories for you. Get information about a proper diet from your health care provider, if necessary.  . Regular physical exercise is one of the  most important things you can do for your health. Most adults should get at least 150 minutes of moderate-intensity exercise (any activity that increases your heart rate and causes you to sweat) each week. In addition, most adults need muscle-strengthening exercises on 2 or more days a week.  Silver Sneakers may be a benefit available to you. To determine eligibility, you may visit the website: www.silversneakers.com or contact program at 7781668177 Mon-Fri between 8AM-8PM.   . Maintain a healthy weight. The body mass index (BMI) is a screening tool to identify possible weight problems. It provides an estimate of body fat based on height and weight. Your health care provider can find your BMI and can help you achieve or maintain a healthy weight.   For adults 20 years and older: ? A BMI below 18.5 is considered underweight. ? A BMI of 18.5 to 24.9 is normal. ? A BMI of 25 to 29.9 is considered overweight. ? A BMI of 30 and above is considered obese.   . Maintain normal blood lipids and cholesterol levels by exercising and minimizing your intake of saturated fat. Eat a balanced diet with plenty of fruit and vegetables. Blood tests for lipids and cholesterol should begin at age 81 and be repeated every 5 years. If your lipid or cholesterol levels are high, you are over 50, or you are at high risk for heart disease, you may  need your cholesterol levels checked more frequently. Ongoing high lipid and cholesterol levels should be treated with medicines if diet and exercise are not working.  . If you smoke, find out from your health care provider how to quit. If you do not use tobacco, please do not start.  . If you choose to drink alcohol, please do not consume more than 2 drinks per day. One drink is considered to be 12 ounces (355 mL) of beer, 5 ounces (148 mL) of wine, or 1.5 ounces (44 mL) of liquor.  . If you are 44-3 years old, ask your health care provider if you should take aspirin to  prevent strokes.  . Use sunscreen. Apply sunscreen liberally and repeatedly throughout the day. You should seek shade when your shadow is shorter than you. Protect yourself by wearing long sleeves, pants, a wide-brimmed hat, and sunglasses year round, whenever you are outdoors.  . Once a month, do a whole body skin exam, using a mirror to look at the skin on your back. Tell your health care provider of new moles, moles that have irregular borders, moles that are larger than a pencil eraser, or moles that have changed in shape or color.

## 2016-10-24 NOTE — Progress Notes (Signed)
Subjective:   Alissia Lory is a 81 y.o. female who presents for Medicare Annual (Subsequent) preventive examination.  Review of Systems:  N/A Cardiac Risk Factors include: advanced age (>51men, >30 women)     Objective:     Vitals: BP 102/64 (BP Location: Right Arm, Patient Position: Sitting, Cuff Size: Normal)   Pulse 87   Temp 98 F (36.7 C) (Oral)   Ht 5' 6.5" (1.689 m) Comment: no shoes  Wt 146 lb 8 oz (66.5 kg)   SpO2 94%   BMI 23.29 kg/m   Body mass index is 23.29 kg/m.   Tobacco History  Smoking Status  . Never Smoker  Smokeless Tobacco  . Never Used     Counseling given: No   Past Medical History:  Diagnosis Date  . Aortic valve disorders   . Arthritis   . Barrett's esophagus   . COPD (chronic obstructive pulmonary disease) ([place])   . Hypothyroidism   . Impaired fasting glucose   . Intrinsic asthma, unspecified   . Obstructive sleep apnea (adult) (pediatric)   . Osteoporosis   . Pure hypercholesterolemia   . Syncope and collapse   . Vitamin D deficiency    Past Surgical History:  Procedure Laterality Date  . BREAST EXCISIONAL BIOPSY Left   . COLONOSCOPY    . COLONOSCOPY WITH PROPOFOL N/A 12/14/2014   Procedure: COLONOSCOPY WITH PROPOFOL;  Surgeon: Lollie Sails, MD;  Location: [facility] ENDOSCOPY;  Service: Endoscopy;  Laterality: N/A;  . ESOPHAGOGASTRODUODENOSCOPY (EGD) WITH PROPOFOL N/A 12/14/2014   Procedure: ESOPHAGOGASTRODUODENOSCOPY (EGD) WITH PROPOFOL;  Surgeon: Lollie Sails, MD;  Location: [facility] ENDOSCOPY;  Service: Endoscopy;  Laterality: N/A;  . ESOPHAGOGASTRODUODENOSCOPY (EGD) WITH PROPOFOL N/A 10/18/2016   Procedure: ESOPHAGOGASTRODUODENOSCOPY (EGD) WITH PROPOFOL;  Surgeon: Lollie Sails, MD;  Location: [facility] ENDOSCOPY;  Service: Endoscopy;  Laterality: N/A;  . EYE SURGERY    . HIP SURGERY    . prosthesis eye implant    . REPLACEMENT UNICONDYLAR JOINT KNEE    . TONSILLECTOMY     Family History  Problem Relation Age of  Onset  . Heart disease Father   . Breast cancer Mother   . Bipolar disorder Brother    History  Sexual Activity  . Sexual activity: No    Outpatient Encounter Prescriptions as of 10/24/2016  Medication Sig  . acetaminophen (TYLENOL) 325 MG tablet Take 650 mg by mouth every 6 (six) hours as needed.  Marland Kitchen albuterol (VENTOLIN HFA) 108 (90 BASE) MCG/ACT inhaler Inhale 2 puffs into the lungs every 6 (six) hours as needed for wheezing.  . budesonide-formoterol (SYMBICORT) 160-4.5 MCG/ACT inhaler Inhale 2 puffs into the lungs 2 (two) times daily.  . calcium carbonate (OS-CAL) 600 MG TABS Take 600 mg by mouth daily.  Marland Kitchen dexlansoprazole (DEXILANT) 60 MG capsule Take 60 mg by mouth daily.  . diclofenac sodium (VOLTAREN) 1 % GEL Apply 4 g topically 4 (four) times daily.  Marland Kitchen levothyroxine (SYNTHROID, LEVOTHROID) 50 MCG tablet TAKE 1 TABLET BY MOUTH  DAILY BEFORE BREAKFAST  . Multiple Vitamin (MULTIVITAMIN) tablet Take 1 tablet by mouth daily.  Marland Kitchen PARoxetine (PAXIL) 30 MG tablet TAKE 1 TABLET BY MOUTH  EVERY MORNING  . predniSONE (DELTASONE) 5 MG tablet Take 1 tablet (5 mg total) by mouth daily with breakfast.  . simvastatin (ZOCOR) 20 MG tablet TAKE 1 TABLET BY MOUTH AT  BEDTIME  . Tiotropium Bromide Monohydrate (SPIRIVA RESPIMAT) 1.25 MCG/ACT AERS Inhale 1.25 Act into the lungs 2 (two) times daily.  Marland Kitchen  ipratropium-albuterol (DUONEB) 0.5-2.5 (3) MG/3ML SOLN Take 3 mLs by nebulization every 8 (eight) hours as needed. 1 ampule 3 times a day for 3 days. (Patient not taking: Reported on 10/18/2016)   Facility-Administered Encounter Medications as of 10/24/2016  Medication  . denosumab (PROLIA) injection 60 mg    Activities of Daily Living In your present state of health, do you have any difficulty performing the following activities: 10/24/2016  Hearing? Y  Vision? Y  Difficulty concentrating or making decisions? N  Walking or climbing stairs? Y  Dressing or bathing? N  Doing errands, shopping? N  Preparing  Food and eating ? N  Using the Toilet? N  In the past six months, have you accidently leaked urine? Y  Do you have problems with loss of bowel control? N  Managing your Medications? N  Managing your Finances? N  Housekeeping or managing your Housekeeping? N  Some recent data might be hidden    Patient Care Team: Lucille Passy, MD as PCP - General (Family Medicine) Vilinda Boehringer, MD (Inactive) as Consulting Physician (Internal Medicine)    Assessment:     Hearing Screening   125Hz  250Hz  500Hz  1000Hz  2000Hz  3000Hz  4000Hz  6000Hz  8000Hz   Right ear:   40 0 0  0    Left ear:   40 40 40  0    Vision Screening Comments: Last vision exam in July 2018 with Dr. Sandra Cockayne   Exercise Activities and Dietary recommendations Current Exercise Habits: The patient does not participate in regular exercise at present, Exercise limited by: None identified  Goals    . Healthy Lifestyle          Starting 10/24/2016, I will continue to garden as weather permits, sleep for at least 6-8 hours daily , and drink at least 3-4 glasses of water daily.       Fall Risk Fall Risk  10/24/2016 10/05/2014 09/02/2014 04/23/2013  Falls in the past year? No No No No   Depression Screen PHQ 2/9 Scores 10/24/2016 12/29/2014 10/05/2014 09/02/2014  PHQ - 2 Score 3 0 0 0  PHQ- 9 Score 5 - - -     Cognitive Function MMSE - Mini Mental State Exam 10/24/2016  Orientation to time 5  Orientation to Place 5  Registration 3  Attention/ Calculation 0  Recall 3  Language- name 2 objects 0  Language- repeat 1  Language- follow 3 step command 3  Language- read & follow direction 0  Write a sentence 0  Copy design 0  Total score 20       PLEASE NOTE: A Mini-Cog screen was completed. Maximum score is 20. A value of 0 denotes this part of Folstein MMSE was not completed or the patient failed this part of the Mini-Cog screening.   Mini-Cog Screening Orientation to Time - Max 5 pts Orientation to Place - Max 5 pts Registration  - Max 3 pts Recall - Max 3 pts Language Repeat - Max 1 pts Language Follow 3 Step Command - Max 3 pts   Immunization History  Administered Date(s) Administered  . Influenza Whole 12/14/2010  . Influenza,inj,Quad PF,6+ Mos 11/10/2012, 11/05/2013, 11/10/2014, 10/17/2015, 10/24/2016  . Influenza-Unspecified 12/04/2011, 11/10/2012, 11/05/2013  . Pneumococcal Conjugate-13 10/07/2009, 04/23/2013, 11/24/2013  . Pneumococcal Polysaccharide-23 11/13/2009  . Td 04/17/2016  . Zoster 05/08/2007   Screening Tests Health Maintenance  Topic Date Due  . TETANUS/TDAP  04/17/2026  . INFLUENZA VACCINE  Completed  . DEXA SCAN  Completed  . PNA vac  Low Risk Adult  Completed      Plan:     I have personally reviewed and addressed the Medicare Annual Wellness questionnaire and have noted the following in the patient's chart:  A. Medical and social history B. Use of alcohol, tobacco or illicit drugs  C. Current medications and supplements D. Functional ability and status E.  Nutritional status F.  Physical activity G. Advance directives H. List of other physicians I.  Hospitalizations, surgeries, and ER visits in previous 12 months J.  [place] to include hearing, vision, cognitive, depression L. Referrals and appointments - none  In addition, I have reviewed and discussed with patient certain preventive protocols, quality metrics, and best practice recommendations. A written personalized care plan for preventive services as well as general preventive health recommendations were provided to patient.  See attached scanned questionnaire for additional information.   Signed,   Lindell Noe, MHA, BS, LPN Health Coach

## 2016-10-26 ENCOUNTER — Other Ambulatory Visit: Payer: Self-pay | Admitting: *Deleted

## 2016-10-26 MED ORDER — BUDESONIDE-FORMOTEROL FUMARATE 160-4.5 MCG/ACT IN AERO: 2.0000 | INHALATION_SPRAY | Freq: Two times a day (BID) | RESPIRATORY_TRACT | 3 refills | Status: DC

## 2016-10-31 ENCOUNTER — Encounter: Payer: Self-pay | Admitting: Family Medicine

## 2016-10-31 ENCOUNTER — Ambulatory Visit (INDEPENDENT_AMBULATORY_CARE_PROVIDER_SITE_OTHER): Payer: Medicare Other | Admitting: Family Medicine

## 2016-10-31 VITALS — BP 112/64 | HR 84 | Temp 98.3°F | Resp 16 | Ht 67.0 in | Wt 149.4 lb

## 2016-10-31 DIAGNOSIS — E785 Hyperlipidemia, unspecified: Secondary | ICD-10-CM | POA: Diagnosis not present

## 2016-10-31 DIAGNOSIS — G4733 Obstructive sleep apnea (adult) (pediatric): Secondary | ICD-10-CM

## 2016-10-31 DIAGNOSIS — J449 Chronic obstructive pulmonary disease, unspecified: Secondary | ICD-10-CM

## 2016-10-31 DIAGNOSIS — E032 Hypothyroidism due to medicaments and other exogenous substances: Secondary | ICD-10-CM | POA: Diagnosis not present

## 2016-10-31 DIAGNOSIS — Z9989 Dependence on other enabling machines and devices: Secondary | ICD-10-CM | POA: Diagnosis not present

## 2016-10-31 MED ORDER — PREDNISONE 5 MG PO TABS: 5.0000 mg | ORAL_TABLET | Freq: Every day | ORAL | 3 refills | Status: DC

## 2016-10-31 MED ORDER — PAROXETINE HCL 30 MG PO TABS: 30.0000 mg | ORAL_TABLET | Freq: Every morning | ORAL | 3 refills | Status: DC

## 2016-10-31 MED ORDER — SIMVASTATIN 20 MG PO TABS: 20.0000 mg | ORAL_TABLET | Freq: Every day | ORAL | 3 refills | Status: DC

## 2016-10-31 MED ORDER — LEVOTHYROXINE SODIUM 50 MCG PO TABS: ORAL_TABLET | ORAL | 3 refills | Status: DC

## 2016-10-31 NOTE — Assessment & Plan Note (Signed)
Clinically euthyroid. No changes made to rxs

## 2016-10-31 NOTE — Assessment & Plan Note (Signed)
Well controlled on current dose of zocor.  No changes made to rxs.

## 2016-10-31 NOTE — Assessment & Plan Note (Signed)
Followed by pulmonary. Compliant with CPAP.

## 2016-10-31 NOTE — Assessment & Plan Note (Signed)
Followed by pulmonary 

## 2016-10-31 NOTE — Progress Notes (Signed)
Patient ID: Dorothy Church, female    DOB: [birth year], 81 y.o.   MRN: [id number]  HPI Comments:  Very pleasant 81 yo female with a history of asthma, obstructive sleep apnea who wears CPAP, hyperlipidemia, OA, osteoporosis here for follow up of chronic medical conditions.  Medicare wellness visit with Candis Musa, RN on 10/24/16.  Note reviewed.   Hypothyroidism- taking synthroid 50 mcg daily.  Denies symptoms of hypo or hyperthyroidism. Lab Results  Component Value Date   TSH 1.53 10/24/2016    OSA- Wears a CPAP.  Osteoporosis-  She is receiving prolia, last injection 07/2016.  Most recent DEXA from 12/01/15- did show improvement- now osteopenic.  Emphysema- followed by pulmonary.  Was last seen by Kasa on 09/17/16- note reviewed. No changes made.  HLD- was taking zocor 20 mg daily.    Lab Results  Component Value Date   CHOL 172 10/24/2016   HDL 48.20 10/24/2016   LDLCALC 109 (H) 10/24/2016   TRIG 74.0 10/24/2016   CHOLHDL 4 10/24/2016   Anxiety- has been well controlled with Paxil 30 mg daily. Denies symptoms of anxiety or depression. Lab Results  Component Value Date   CREATININE 0.75 10/24/2016   Lab Results  Component Value Date   TSH 1.53 10/24/2016   Lab Results  Component Value Date   WBC 5.5 05/11/2016   HGB 14.3 05/11/2016   HCT 42.2 05/11/2016   MCV 90.0 05/11/2016   PLT 240.0 05/11/2016   Lab Results  Component Value Date   ALT 11 10/24/2016   AST 15 10/24/2016   ALKPHOS 37 (L) 10/24/2016   BILITOT 0.4 10/24/2016     Patient Active Problem List   Diagnosis Date Noted  . Asthma, chronic, severe persistent, uncomplicated 09/32/6712  . OA (osteoarthritis) 04/04/2015  . Chronic obstructive airway disease with asthma ([place]) 11/10/2014  . History of colonic polyps 12/08/2013  . Hypersomnia with sleep apnea 04/23/2013  . Osteoporosis 10/07/2012  . Vitamin D deficiency   . Hypothyroidism   . Hyperlipidemia 08/07/2012  . Obstructive sleep  apnea on CPAP 08/07/2012  . Asthma 08/07/2012   Past Medical History:  Diagnosis Date  . Aortic valve disorders   . Arthritis   . Barrett's esophagus   . COPD (chronic obstructive pulmonary disease) ([place])   . Hypothyroidism   . Impaired fasting glucose   . Intrinsic asthma, unspecified   . Obstructive sleep apnea (adult) (pediatric)   . Osteoporosis   . Pure hypercholesterolemia   . Syncope and collapse   . Vitamin D deficiency    Past Surgical History:  Procedure Laterality Date  . BREAST EXCISIONAL BIOPSY Left   . COLONOSCOPY    . COLONOSCOPY WITH PROPOFOL N/A 12/14/2014   Procedure: COLONOSCOPY WITH PROPOFOL;  Surgeon: Lollie Sails, MD;  Location: [facility] ENDOSCOPY;  Service: Endoscopy;  Laterality: N/A;  . ESOPHAGOGASTRODUODENOSCOPY (EGD) WITH PROPOFOL N/A 12/14/2014   Procedure: ESOPHAGOGASTRODUODENOSCOPY (EGD) WITH PROPOFOL;  Surgeon: Lollie Sails, MD;  Location: [facility] ENDOSCOPY;  Service: Endoscopy;  Laterality: N/A;  . ESOPHAGOGASTRODUODENOSCOPY (EGD) WITH PROPOFOL N/A 10/18/2016   Procedure: ESOPHAGOGASTRODUODENOSCOPY (EGD) WITH PROPOFOL;  Surgeon: Lollie Sails, MD;  Location: [facility] ENDOSCOPY;  Service: Endoscopy;  Laterality: N/A;  . EYE SURGERY    . HIP SURGERY    . prosthesis eye implant    . REPLACEMENT UNICONDYLAR JOINT KNEE    . TONSILLECTOMY     Social History  Substance Use Topics  . Smoking status: Never Smoker  . Smokeless tobacco:  Never Used  . Alcohol use 0.6 oz/week    1 Glasses of wine per week     Comment: occas. wine   Family History  Problem Relation Age of Onset  . Heart disease Father   . Breast cancer Mother   . Bipolar disorder Brother    Allergies  Allergen Reactions  . Amoxicillin Other (See Comments)    Pencillins  . Cefzil [Cefprozil]   . Cephalexin   . Propoxyphene     Other reaction(s): Unknown  . Relafen [Nabumetone]   . Talwin [Pentazocine]    Current Outpatient Prescriptions on File Prior to Visit  Medication  Sig Dispense Refill  . acetaminophen (TYLENOL) 325 MG tablet Take 650 mg by mouth every 6 (six) hours as needed.    Marland Kitchen albuterol (VENTOLIN HFA) 108 (90 BASE) MCG/ACT inhaler Inhale 2 puffs into the lungs every 6 (six) hours as needed for wheezing. 18 g 5  . budesonide-formoterol (SYMBICORT) 160-4.5 MCG/ACT inhaler Inhale 2 puffs into the lungs 2 (two) times daily. 3 Inhaler 3  . calcium carbonate (OS-CAL) 600 MG TABS Take 600 mg by mouth daily.    Marland Kitchen dexlansoprazole (DEXILANT) 60 MG capsule Take 60 mg by mouth daily.    . diclofenac sodium (VOLTAREN) 1 % GEL Apply 4 g topically 4 (four) times daily. 5 Tube 11  . ipratropium-albuterol (DUONEB) 0.5-2.5 (3) MG/3ML SOLN Take 3 mLs by nebulization every 8 (eight) hours as needed. 1 ampule 3 times a day for 3 days. 360 mL 3  . levothyroxine (SYNTHROID, LEVOTHROID) 50 MCG tablet TAKE 1 TABLET BY MOUTH  DAILY BEFORE BREAKFAST 90 tablet 3  . Multiple Vitamin (MULTIVITAMIN) tablet Take 1 tablet by mouth daily.    Marland Kitchen PARoxetine (PAXIL) 30 MG tablet TAKE 1 TABLET BY MOUTH  EVERY MORNING 90 tablet 3  . predniSONE (DELTASONE) 5 MG tablet Take 1 tablet (5 mg total) by mouth daily with breakfast. 90 tablet 3  . simvastatin (ZOCOR) 20 MG tablet TAKE 1 TABLET BY MOUTH AT  BEDTIME 90 tablet 1  . Tiotropium Bromide Monohydrate (SPIRIVA RESPIMAT) 1.25 MCG/ACT AERS Inhale 1.25 Act into the lungs 2 (two) times daily. 1 Inhaler 12   Current Facility-Administered Medications on File Prior to Visit  Medication Dose Route Frequency Provider Last Rate Last Dose  . denosumab (PROLIA) injection 60 mg  60 mg Subcutaneous Q6 months Lucille Passy, MD   60 mg at 12/13/15 1422   The PMH, PSH, Social History, Family History, Medications, and allergies have been reviewed in [facility], and have been updated if relevant.     Review of Systems  Constitutional: Negative.   HENT: Negative.   Eyes: Negative.   Respiratory: Negative for shortness of breath, wheezing and stridor.    Cardiovascular: Negative.   Gastrointestinal: Negative.   Endocrine: Negative.   Genitourinary: Negative.   Musculoskeletal: Negative.   Skin: Negative.   Allergic/Immunologic: Negative.   Neurological: Negative.   Hematological: Negative.   Psychiatric/Behavioral: Negative.   All other systems reviewed and are negative.       Physical Exam  BP 112/64   Pulse 84   Temp 98.3 F (36.8 C) (Oral)   Resp 16   Ht 5\' 7"  (1.702 m)   Wt 149 lb 6.4 oz (67.8 kg)   SpO2 97%   BMI 23.40 kg/m    General:  Well-developed,well-nourished,in no acute distress; alert,appropriate and cooperative throughout examination Head:  normocephalic and atraumatic.   Eyes:  vision grossly intact, PERRL  Ears:  R ear normal and L ear normal externally, TMs clear bilaterally Nose:  no external deformity.   Mouth:  good dentition.   Neck:  No deformities, masses, or tenderness noted. Lungs:  Normal respiratory effort, chest expands symmetrically. Lungs are clear to auscultation, no crackles or wheezes. Heart:  Normal rate and regular rhythm. S1 and S2 normal without gallop, murmur, click, rub or other extra sounds. Abdomen:  Bowel sounds positive,abdomen soft and non-tender without masses, organomegaly or hernias noted. Msk:  No deformity or scoliosis noted of thoracic or lumbar spine.   Extremities:  No clubbing, cyanosis, edema, or deformity noted with normal full range of motion of all joints.   Neurologic:  alert & oriented X3 and gait normal.   Skin:  Intact without suspicious lesions or rashes Cervical Nodes:  No lymphadenopathy noted Axillary Nodes:  No palpable lymphadenopathy Psych:  Cognition and judgment appear intact. Alert and cooperative with normal attention span and concentration. No apparent delusions, illusions, hallucinations

## 2016-10-31 NOTE — Patient Instructions (Signed)
Great to see you. Please start taking approximately 2000 IU daily Vit D.

## 2016-11-23 ENCOUNTER — Encounter: Payer: Self-pay | Admitting: Family Medicine

## 2016-11-23 ENCOUNTER — Ambulatory Visit (INDEPENDENT_AMBULATORY_CARE_PROVIDER_SITE_OTHER): Payer: Medicare Other | Admitting: Family Medicine

## 2016-11-23 VITALS — BP 120/58 | HR 86 | Temp 98.2°F | Ht 67.0 in | Wt 149.0 lb

## 2016-11-23 DIAGNOSIS — N3090 Cystitis, unspecified without hematuria: Secondary | ICD-10-CM

## 2016-11-23 DIAGNOSIS — M1711 Unilateral primary osteoarthritis, right knee: Secondary | ICD-10-CM

## 2016-11-23 DIAGNOSIS — R300 Dysuria: Secondary | ICD-10-CM | POA: Diagnosis not present

## 2016-11-23 LAB — POCT URINALYSIS DIPSTICK
BILIRUBIN UA: NEGATIVE
GLUCOSE UA: NEGATIVE
Ketones, UA: NEGATIVE
Nitrite, UA: NEGATIVE
SPEC GRAV UA: 1.02 (ref 1.010–1.025)
UROBILINOGEN UA: 0.2 U/dL
pH, UA: 6 (ref 5.0–8.0)

## 2016-11-23 MED ORDER — NITROFURANTOIN MONOHYD MACRO 100 MG PO CAPS: 100.0000 mg | ORAL_CAPSULE | Freq: Two times a day (BID) | ORAL | 0 refills | Status: DC

## 2016-11-23 MED ORDER — PREDNISONE 10 MG PO TABS: 10.0000 mg | ORAL_TABLET | Freq: Every day | ORAL | 0 refills | Status: DC

## 2016-11-23 NOTE — Progress Notes (Signed)
Subjective:    Patient ID: Dorothy Church, female    DOB: [birth year], 81 y.o.   MRN: [id number]  HPI This is a 81 yo female who presents today with several days (3) of dysuria. Dysuria worse in morning. Some lower abdominal pain, no nausea or vomiting, no flank pain, no fever or chills. No gross hematuria. No recent travel or limited bathroom access. Not sexually active. She does have chronic incontinence and wears a pad.  No recent diarrhea or constipation.   She requests prescription for small amount of prednisone because she had to double dose for several days for arthritis flare and will run out early.   Past Medical History:  Diagnosis Date  . Aortic valve disorders   . Arthritis   . Barrett's esophagus   . COPD (chronic obstructive pulmonary disease) ([place])   . Hypothyroidism   . Impaired fasting glucose   . Intrinsic asthma, unspecified   . Obstructive sleep apnea (adult) (pediatric)   . Osteoporosis   . Pure hypercholesterolemia   . Syncope and collapse   . Vitamin D deficiency    Past Surgical History:  Procedure Laterality Date  . BREAST EXCISIONAL BIOPSY Left   . COLONOSCOPY    . COLONOSCOPY WITH PROPOFOL N/A 12/14/2014   Procedure: COLONOSCOPY WITH PROPOFOL;  Surgeon: Lollie Sails, MD;  Location: [facility] ENDOSCOPY;  Service: Endoscopy;  Laterality: N/A;  . ESOPHAGOGASTRODUODENOSCOPY (EGD) WITH PROPOFOL N/A 12/14/2014   Procedure: ESOPHAGOGASTRODUODENOSCOPY (EGD) WITH PROPOFOL;  Surgeon: Lollie Sails, MD;  Location: [facility] ENDOSCOPY;  Service: Endoscopy;  Laterality: N/A;  . ESOPHAGOGASTRODUODENOSCOPY (EGD) WITH PROPOFOL N/A 10/18/2016   Procedure: ESOPHAGOGASTRODUODENOSCOPY (EGD) WITH PROPOFOL;  Surgeon: Lollie Sails, MD;  Location: [facility] ENDOSCOPY;  Service: Endoscopy;  Laterality: N/A;  . EYE SURGERY    . HIP SURGERY    . prosthesis eye implant    . REPLACEMENT UNICONDYLAR JOINT KNEE    . TONSILLECTOMY     Family History  Problem Relation Age of  Onset  . Heart disease Father   . Breast cancer Mother   . Bipolar disorder Brother    Social History  Substance Use Topics  . Smoking status: Never Smoker  . Smokeless tobacco: Never Used  . Alcohol use 0.6 oz/week    1 Glasses of wine per week     Comment: occas. wine      Review of Systems Per HPI    Objective:   Physical Exam  Constitutional: She is oriented to person, place, and time. She appears well-developed and well-nourished. No distress.  HENT:  Head: Normocephalic and atraumatic.  Cardiovascular: Normal rate and normal heart sounds.   Pulmonary/Chest: Effort normal and breath sounds normal.  Abdominal: Soft. She exhibits no distension. There is no tenderness. There is no rebound, no guarding and no CVA tenderness.  Neurological: She is alert and oriented to person, place, and time.  Skin: Skin is warm and dry. She is not diaphoretic.  Psychiatric: She has a normal mood and affect. Her behavior is normal. Judgment and thought content normal.  Vitals reviewed.     BP (!) 120/58   Pulse 86   Temp 98.2 F (36.8 C) (Oral)   Ht 5\' 7"  (1.702 m)   Wt 149 lb (67.6 kg)   SpO2 93%   BMI 23.34 kg/m  Wt Readings from Last 3 Encounters:  11/23/16 149 lb (67.6 kg)  10/31/16 149 lb 6.4 oz (67.8 kg)  10/24/16 146 lb 8 oz (66.5 kg)  Assessment & Plan:  1. Dysuria - POCT Urinalysis Dipstick - Urine Culture  2. Cystitis - Provided written and verbal information regarding diagnosis and treatment. - nitrofurantoin, macrocrystal-monohydrate, (MACROBID) 100 MG capsule; Take 1 capsule (100 mg total) by mouth 2 (two) times daily.  Dispense: 14 capsule; Refill: 0 -RTC/ED precautions reviewed - encouraged her to increase fluids, change pads frequently  3. Osteoarthritis of right knee, unspecified osteoarthritis type - predniSONE (DELTASONE) 10 MG tablet; Take 1 tablet (10 mg total) by mouth daily with breakfast.  Dispense: 7 tablet; Refill: 0- she will take 1/2  tablet   Clarene Reamer, FNP-BC  [facility] [place]  11/23/2016 9:23 AM

## 2016-11-23 NOTE — Progress Notes (Signed)
Pre visit review using our clinic review tool, if applicable. No additional management support is needed unless otherwise documented below in the visit note. 

## 2016-11-23 NOTE — Patient Instructions (Signed)

## 2016-11-26 LAB — URINE CULTURE
MICRO NUMBER:: 81110564
SPECIMEN QUALITY:: ADEQUATE

## 2016-11-27 ENCOUNTER — Encounter: Payer: Self-pay | Admitting: Family Medicine

## 2016-12-03 ENCOUNTER — Encounter: Payer: Self-pay | Admitting: Internal Medicine

## 2016-12-03 ENCOUNTER — Ambulatory Visit (INDEPENDENT_AMBULATORY_CARE_PROVIDER_SITE_OTHER): Payer: Medicare Other | Admitting: Internal Medicine

## 2016-12-03 VITALS — BP 132/82 | HR 102 | Ht 67.0 in | Wt 148.0 lb

## 2016-12-03 DIAGNOSIS — J4520 Mild intermittent asthma, uncomplicated: Secondary | ICD-10-CM

## 2016-12-03 NOTE — Progress Notes (Signed)
[place] [facility] Consultation      MRN# [id number] Dorothy Church 21-Feb-1935   CC: Chief Complaint  Patient presents with  . Follow-up    OSA and emphysema:      Brief History: She is a pleasant 81 year old female past medical history of asthma  Patient states she had asthma during childhood where she had frequent hospitalizations but no intubations. Patient states her breathing is trouble with certain triggers such as mold, dust, damp areas. She was told about 45 years ago she had COPD and was also placed on Symbicort at that time along with as needed albuterol. She has a history of obstructive sleep apnea diagnosed 8 years ago and she's currently on CPAP; she doesn't recall her CPAP settings. Patient states she never smoked tobacco had any secondhand exposure.  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   ROV 06/17/14 Patient presents today for a follow up visit, doing well today. Back to using symbicort. Had a CT Chest since last visit. Only using rescue inhaler about 1 time a week since last visit.   Plan: - pulmonary function testing and 6 minute walk test prior to follow up - Spiriva Respimat (2.38mcg) - 2 puff once a day - Patient given a sample - if having improvement by week 2, then patient will call us and we can fill this prescription - continue with Symbicort - continue with albuterol rescue inhaler - 2puff every 3-4 hours as needed for shortness of breath\wheezing\recurrent cough - a followup visit will consider further evaluation with GI referral if patient is still symptomatic with shortness of breath and cough  ROV 08/16/14: Patient presents for follow up visit of COPD along with PFTs and 6 MWT  Did not call in refill for spiriva, but stated that the 2 week trial was helpful Using Symbicort as directed.  Patient states overall she is doing well except she has shortness  of breath mainly with exertion. No cough, no wheeze, no chest tightness. Plan: - Spiriva Respimat (2.32mcg) - 2 puff once a day - Patient given a sample - continue with Symbicort - continue with albuterol rescue inhaler - 2puff every 3-4 hours as needed for shortness of breath\wheezing\recurrent cough - Referral to pulmonary rehabilitation  ROV 11/10/14 Patient presents today for a follow up visit.  Still with some dyspnea, but states that it has been improving. Currently on symbicort and spiriva.  States that she is in the insurance lapse, and her inhalers are very expensive.  Currently attending pulm rehab, attended 3 weeks so far.   ONO in 09/2014 showed that she does not require supplemental O2 at night.  Plan -continue with Symbicort and Spiriva, albuterol as needed, exercise as tolerated.   ROV 12-03-16 She presents today for follow-up visit of her asthma and OSA. no URIs this year, no need for antibiotics and extra use of inhalers. Patient uses Symbicort 2 puffs daily in the morning but does not take the second dose at nighttime and this has helped her symptoms and not had any exacerbations at this time Patient states she uses her CPAP at nighttime however I do not have a compliance report in hand  Patient states that Claritin has helped significantly Her asthma is well controlled at this time No signs of infection at this time      Medication:    Current Outpatient Prescriptions:  .  acetaminophen (TYLENOL) 325 MG  tablet, Take 650 mg by mouth every 6 (six) hours as needed., Disp: , Rfl:  .  albuterol (VENTOLIN HFA) 108 (90 BASE) MCG/ACT inhaler, Inhale 2 puffs into the lungs every 6 (six) hours as needed for wheezing., Disp: 18 g, Rfl: 5 .  budesonide-formoterol (SYMBICORT) 160-4.5 MCG/ACT inhaler, Inhale 2 puffs into the lungs 2 (two) times daily., Disp: 3 Inhaler, Rfl: 3 .  calcium carbonate (OS-CAL) 600 MG TABS, Take 600 mg by mouth daily., Disp: , Rfl:  .  dexlansoprazole  (DEXILANT) 60 MG capsule, Take 60 mg by mouth daily., Disp: , Rfl:  .  diclofenac sodium (VOLTAREN) 1 % GEL, Apply 4 g topically 4 (four) times daily., Disp: 5 Tube, Rfl: 11 .  ipratropium-albuterol (DUONEB) 0.5-2.5 (3) MG/3ML SOLN, Take 3 mLs by nebulization every 8 (eight) hours as needed. 1 ampule 3 times a day for 3 days., Disp: 360 mL, Rfl: 3 .  levothyroxine (SYNTHROID, LEVOTHROID) 50 MCG tablet, TAKE 1 TABLET BY MOUTH  DAILY BEFORE BREAKFAST, Disp: 90 tablet, Rfl: 3 .  Multiple Vitamin (MULTIVITAMIN) tablet, Take 1 tablet by mouth daily., Disp: , Rfl:  .  nitrofurantoin, macrocrystal-monohydrate, (MACROBID) 100 MG capsule, Take 1 capsule (100 mg total) by mouth 2 (two) times daily., Disp: 14 capsule, Rfl: 0 .  PARoxetine (PAXIL) 30 MG tablet, Take 1 tablet (30 mg total) by mouth every morning., Disp: 90 tablet, Rfl: 3 .  predniSONE (DELTASONE) 10 MG tablet, Take 1 tablet (10 mg total) by mouth daily with breakfast., Disp: 7 tablet, Rfl: 0 .  predniSONE (DELTASONE) 5 MG tablet, Take 1 tablet (5 mg total) by mouth daily with breakfast., Disp: 90 tablet, Rfl: 3 .  simvastatin (ZOCOR) 20 MG tablet, Take 1 tablet (20 mg total) by mouth at bedtime., Disp: 90 tablet, Rfl: 3 .  Tiotropium Bromide Monohydrate (SPIRIVA RESPIMAT) 1.25 MCG/ACT AERS, Inhale 1.25 Act into the lungs 2 (two) times daily., Disp: 1 Inhaler, Rfl: 12  Current Facility-Administered Medications:  .  denosumab (PROLIA) injection 60 mg, 60 mg, Subcutaneous, Q6 months, Deborra Medina, Talia M, MD, 60 mg at 12/13/15 1422    Review of Systems  Constitutional: Negative for chills and fever.  Eyes: Negative for blurred vision.  Respiratory: Negative for cough, hemoptysis, sputum production and shortness of breath.   Cardiovascular: Negative for chest pain and palpitations.  Gastrointestinal: Negative for heartburn.  Genitourinary: Negative for dysuria.  Musculoskeletal: Negative for myalgias.  Skin: Negative for itching and rash.    Neurological: Negative for dizziness and headaches.  Endo/Heme/Allergies: Does not bruise/bleed easily.  Psychiatric/Behavioral: Negative for depression.      Allergies:  Amoxicillin; Cefzil [cefprozil]; Cephalexin; Propoxyphene; Relafen [nabumetone]; and Talwin [pentazocine]  Physical Examination:  BP 132/82 (BP Location: Left Arm, Cuff Size: Normal)   Pulse (!) 102   Ht 5\' 7"  (1.702 m)   Wt 148 lb (67.1 kg)   SpO2 97%   BMI 23.18 kg/m   General Appearance: No distress  HEENT: PERRLA, no other lesions noticed Pulmonary:normal breath sounds., diaphragmatic excursion normal.No wheezing, No rales   Cardiovascular:  Normal S1,S2.  No m/r/g.     Abdomen:Exam: Benign, Soft, non-tender, No masses  Skin:   warm, no rashes, no ecchymosis  Extremities: normal, no cyanosis, clubbing, warm with normal capillary refill.     Assessment and Plan: 81 yo F with PMHx of mild persistent Asthma and OSA presenting for OSA follow-up in the setting of allergic rhinitis At this time her asthma is well controlled with Symbicort  2 puffs daily and she has not had to use her albuterol often Patient will need to send a compliance report for her underlying sleep apnea assessment and it is not available at this time   #1 Obstructive sleep apnea on CPAP Will need compliance report to assess for underlying sleep apnea  Plan: -Continue with AutoPap Patient will need to change companies in order to get most recent compliance report  #2 chronic persistent asthma Patient with prolonged chronic asthma and is a never smoker with no significant exposure to 2nd hand smoke. PFTs with significant reversibility.  -continue symbicort 2 puffs daily and as needed at night - cont with exercise - albuterol as needed.   #3 allergic rhinitis Continue Claritin  #4 deconditioned state Recommend exercise daily  Patient satisfied with Plan of action and management. All questions answered Follow-up in 6  months   Hughes Wyndham Patricia Pesa, M.D.  [facility]  Medical Director [facility] Cardio-Pulmonary Department

## 2016-12-03 NOTE — Patient Instructions (Addendum)
Continue inhalers as prescribed-use Symbicort 2 puffs daily and use at nighttime if needed Will need to obtain compliance report for sleep apnea Albuterol as needed Continue Claritin

## 2016-12-04 ENCOUNTER — Telehealth: Payer: Self-pay | Admitting: Internal Medicine

## 2016-12-04 ENCOUNTER — Encounter: Payer: Self-pay | Admitting: Internal Medicine

## 2016-12-04 NOTE — Telephone Encounter (Signed)
Patient unable to get in touch with [employer] re cpap compliance report   She wants to talk to the nurse about what to do

## 2016-12-04 NOTE — Telephone Encounter (Signed)
tried to call [employer] but they were closed for lunch. Will have to call back after 1pm.

## 2016-12-04 NOTE — Telephone Encounter (Signed)
Spoke with Bethanne Ginger who states Jonni Sanger Is with a patient and she will have Jonni Sanger to call pt. Called Pt and informed her she would be receiving a call from [facility] [place] and gave her another number to call and ask for him. Asked Bethanne Ginger to have Jonni Sanger to fax [place] a compliance report once they got pt's CPAP machine setup correctly. Nothing further needed.

## 2016-12-05 ENCOUNTER — Encounter: Payer: Self-pay | Admitting: Family Medicine

## 2016-12-06 ENCOUNTER — Other Ambulatory Visit (INDEPENDENT_AMBULATORY_CARE_PROVIDER_SITE_OTHER): Payer: Medicare Other

## 2016-12-06 DIAGNOSIS — R8290 Unspecified abnormal findings in urine: Secondary | ICD-10-CM

## 2016-12-06 LAB — URINALYSIS, ROUTINE W REFLEX MICROSCOPIC
BILIRUBIN URINE: NEGATIVE
Ketones, ur: NEGATIVE
LEUKOCYTES UA: NEGATIVE
NITRITE: NEGATIVE
Specific Gravity, Urine: 1.015 (ref 1.000–1.030)
Total Protein, Urine: NEGATIVE
Urine Glucose: NEGATIVE
Urobilinogen, UA: 0.2 (ref 0.0–1.0)
WBC, UA: NONE SEEN (ref 0–?)
pH: 7.5 (ref 5.0–8.0)

## 2016-12-11 ENCOUNTER — Observation Stay: Payer: Medicare Other

## 2016-12-11 ENCOUNTER — Encounter: Payer: Self-pay | Admitting: Emergency Medicine

## 2016-12-11 ENCOUNTER — Emergency Department: Payer: Medicare Other

## 2016-12-11 ENCOUNTER — Observation Stay
Admission: EM | Admit: 2016-12-11 | Discharge: 2016-12-12 | Disposition: A | Discharge status: Home / Self Care | Payer: Medicare Other | Attending: Internal Medicine | Admitting: Internal Medicine

## 2016-12-11 DIAGNOSIS — J449 Chronic obstructive pulmonary disease, unspecified: Secondary | ICD-10-CM | POA: Diagnosis not present

## 2016-12-11 DIAGNOSIS — E7800 Pure hypercholesterolemia, unspecified: Secondary | ICD-10-CM | POA: Insufficient documentation

## 2016-12-11 DIAGNOSIS — I10 Essential (primary) hypertension: Secondary | ICD-10-CM | POA: Diagnosis not present

## 2016-12-11 DIAGNOSIS — R202 Paresthesia of skin: Secondary | ICD-10-CM | POA: Diagnosis not present

## 2016-12-11 DIAGNOSIS — K2270 Barrett's esophagus without dysplasia: Secondary | ICD-10-CM | POA: Diagnosis not present

## 2016-12-11 DIAGNOSIS — R531 Weakness: Secondary | ICD-10-CM

## 2016-12-11 DIAGNOSIS — R1011 Right upper quadrant pain: Secondary | ICD-10-CM | POA: Diagnosis not present

## 2016-12-11 DIAGNOSIS — K219 Gastro-esophageal reflux disease without esophagitis: Secondary | ICD-10-CM | POA: Insufficient documentation

## 2016-12-11 DIAGNOSIS — Z7982 Long term (current) use of aspirin: Secondary | ICD-10-CM | POA: Insufficient documentation

## 2016-12-11 DIAGNOSIS — G4733 Obstructive sleep apnea (adult) (pediatric): Secondary | ICD-10-CM | POA: Insufficient documentation

## 2016-12-11 DIAGNOSIS — G459 Transient cerebral ischemic attack, unspecified: Secondary | ICD-10-CM | POA: Diagnosis not present

## 2016-12-11 DIAGNOSIS — E559 Vitamin D deficiency, unspecified: Secondary | ICD-10-CM | POA: Diagnosis not present

## 2016-12-11 DIAGNOSIS — R29818 Other symptoms and signs involving the nervous system: Secondary | ICD-10-CM | POA: Diagnosis not present

## 2016-12-11 DIAGNOSIS — E039 Hypothyroidism, unspecified: Secondary | ICD-10-CM | POA: Diagnosis not present

## 2016-12-11 DIAGNOSIS — I639 Cerebral infarction, unspecified: Secondary | ICD-10-CM | POA: Diagnosis not present

## 2016-12-11 DIAGNOSIS — Z880 Allergy status to penicillin: Secondary | ICD-10-CM | POA: Diagnosis not present

## 2016-12-11 DIAGNOSIS — Z8673 Personal history of transient ischemic attack (TIA), and cerebral infarction without residual deficits: Secondary | ICD-10-CM | POA: Insufficient documentation

## 2016-12-11 DIAGNOSIS — Z79899 Other long term (current) drug therapy: Secondary | ICD-10-CM | POA: Insufficient documentation

## 2016-12-11 DIAGNOSIS — I6529 Occlusion and stenosis of unspecified carotid artery: Secondary | ICD-10-CM | POA: Diagnosis not present

## 2016-12-11 DIAGNOSIS — E785 Hyperlipidemia, unspecified: Secondary | ICD-10-CM | POA: Insufficient documentation

## 2016-12-11 DIAGNOSIS — R4182 Altered mental status, unspecified: Secondary | ICD-10-CM | POA: Diagnosis not present

## 2016-12-11 HISTORY — DX: Pure hypercholesterolemia, unspecified: E78.00

## 2016-12-11 HISTORY — DX: Transient cerebral ischemic attack, unspecified: G45.9

## 2016-12-11 LAB — COMPREHENSIVE METABOLIC PANEL
ALBUMIN: 3.7 g/dL (ref 3.5–5.0)
ALK PHOS: 30 U/L — AB (ref 38–126)
ALT: 16 U/L (ref 14–54)
ANION GAP: 7 (ref 5–15)
AST: 21 U/L (ref 15–41)
BILIRUBIN TOTAL: 0.8 mg/dL (ref 0.3–1.2)
BUN: 15 mg/dL (ref 6–20)
CALCIUM: 8.4 mg/dL — AB (ref 8.9–10.3)
CO2: 27 mmol/L (ref 22–32)
Chloride: 105 mmol/L (ref 101–111)
Creatinine, Ser: 0.83 mg/dL (ref 0.44–1.00)
GLUCOSE: 98 mg/dL (ref 65–99)
POTASSIUM: 3.5 mmol/L (ref 3.5–5.1)
Sodium: 139 mmol/L (ref 135–145)
TOTAL PROTEIN: 6.7 g/dL (ref 6.5–8.1)

## 2016-12-11 LAB — CBC
HCT: 40.6 % (ref 35.0–47.0)
HEMOGLOBIN: 13.6 g/dL (ref 12.0–16.0)
MCH: 30.6 pg (ref 26.0–34.0)
MCHC: 33.6 g/dL (ref 32.0–36.0)
MCV: 90.9 fL (ref 80.0–100.0)
Platelets: 223 10*3/uL (ref 150–440)
RBC: 4.46 MIL/uL (ref 3.80–5.20)
RDW: 12.8 % (ref 11.5–14.5)
WBC: 5.7 10*3/uL (ref 3.6–11.0)

## 2016-12-11 LAB — DIFFERENTIAL
Basophils Absolute: 0 10*3/uL (ref 0–0.1)
Basophils Relative: 0 %
EOS ABS: 0.3 10*3/uL (ref 0–0.7)
EOS PCT: 5 %
LYMPHS ABS: 0.9 10*3/uL — AB (ref 1.0–3.6)
LYMPHS PCT: 16 %
MONO ABS: 0.5 10*3/uL (ref 0.2–0.9)
Monocytes Relative: 9 %
NEUTROS PCT: 70 %
Neutro Abs: 4 10*3/uL (ref 1.4–6.5)

## 2016-12-11 LAB — APTT: APTT: 28 s (ref 24–36)

## 2016-12-11 LAB — URINALYSIS, COMPLETE (UACMP) WITH MICROSCOPIC
BILIRUBIN URINE: NEGATIVE
Bacteria, UA: NONE SEEN
GLUCOSE, UA: NEGATIVE mg/dL
Hgb urine dipstick: NEGATIVE
Ketones, ur: NEGATIVE mg/dL
LEUKOCYTES UA: NEGATIVE
Nitrite: NEGATIVE
PH: 7 (ref 5.0–8.0)
Protein, ur: NEGATIVE mg/dL
SPECIFIC GRAVITY, URINE: 1.016 (ref 1.005–1.030)

## 2016-12-11 LAB — TROPONIN I

## 2016-12-11 LAB — URINE DRUG SCREEN, QUALITATIVE (ARMC ONLY)
AMPHETAMINES, UR SCREEN: NOT DETECTED
BARBITURATES, UR SCREEN: NOT DETECTED
BENZODIAZEPINE, UR SCRN: NOT DETECTED
Cannabinoid 50 Ng, Ur ~~LOC~~: NOT DETECTED
Cocaine Metabolite,Ur ~~LOC~~: NOT DETECTED
MDMA (Ecstasy)Ur Screen: NOT DETECTED
METHADONE SCREEN, URINE: NOT DETECTED
OPIATE, UR SCREEN: NOT DETECTED
Phencyclidine (PCP) Ur S: NOT DETECTED
TRICYCLIC, UR SCREEN: NOT DETECTED

## 2016-12-11 LAB — ETHANOL: Alcohol, Ethyl (B): <10 mg/dL (ref <10)

## 2016-12-11 LAB — PROTIME-INR
INR: 0.99
Prothrombin Time: 13 seconds (ref 11.4–15.2)

## 2016-12-11 LAB — GLUCOSE, CAPILLARY: Glucose-Capillary: 96 mg/dL (ref 65–99)

## 2016-12-11 MED ORDER — PAROXETINE HCL 30 MG PO TABS
30.0000 mg | ORAL_TABLET | Freq: Every morning | ORAL | Status: DC
Start: 2016-12-11 — End: 2016-12-12
  Administered 2016-12-12: 09:00:00 30 mg via ORAL
  Filled 2016-12-11 (×2): qty 1

## 2016-12-11 MED ORDER — ADULT MULTIVITAMIN W/MINERALS CH
1.0000 | ORAL_TABLET | Freq: Every day | ORAL | Status: DC
  Administered 2016-12-12: 1 via ORAL
  Filled 2016-12-11 (×3): qty 1

## 2016-12-11 MED ORDER — ACETAMINOPHEN 650 MG RE SUPP: 650.0000 mg | Freq: Four times a day (QID) | RECTAL | Status: DC | PRN

## 2016-12-11 MED ORDER — ASPIRIN 81 MG PO CHEW
324.0000 mg | CHEWABLE_TABLET | Freq: Once | ORAL | Status: AC
  Administered 2016-12-11: 324 mg via ORAL
  Filled 2016-12-11: qty 4

## 2016-12-11 MED ORDER — ALBUTEROL SULFATE HFA 108 (90 BASE) MCG/ACT IN AERS: 2.0000 | INHALATION_SPRAY | Freq: Four times a day (QID) | RESPIRATORY_TRACT | Status: DC | PRN

## 2016-12-11 MED ORDER — PREDNISONE 10 MG PO TABS
5.0000 mg | ORAL_TABLET | Freq: Every day | ORAL | Status: DC
  Administered 2016-12-12: 09:00:00 5 mg via ORAL
  Filled 2016-12-11: qty 1

## 2016-12-11 MED ORDER — ALBUTEROL SULFATE (2.5 MG/3ML) 0.083% IN NEBU: 2.5000 mg | INHALATION_SOLUTION | Freq: Four times a day (QID) | RESPIRATORY_TRACT | Status: DC | PRN

## 2016-12-11 MED ORDER — ACETAMINOPHEN 325 MG PO TABS: 650.0000 mg | ORAL_TABLET | Freq: Four times a day (QID) | ORAL | Status: DC | PRN

## 2016-12-11 MED ORDER — ASPIRIN EC 81 MG PO TBEC
81.0000 mg | DELAYED_RELEASE_TABLET | Freq: Every day | ORAL | Status: DC
  Administered 2016-12-12: 81 mg via ORAL
  Filled 2016-12-11: qty 1

## 2016-12-11 MED ORDER — SIMVASTATIN 20 MG PO TABS
20.0000 mg | ORAL_TABLET | Freq: Every day | ORAL | Status: DC
  Administered 2016-12-11: 20:00:00 20 mg via ORAL
  Filled 2016-12-11: qty 1

## 2016-12-11 MED ORDER — LEVOTHYROXINE SODIUM 50 MCG PO TABS
50.0000 ug | ORAL_TABLET | Freq: Every day | ORAL | Status: DC
  Administered 2016-12-12: 09:00:00 50 ug via ORAL
  Filled 2016-12-11: qty 1

## 2016-12-11 MED ORDER — ENOXAPARIN SODIUM 40 MG/0.4ML ~~LOC~~ SOLN
40.0000 mg | SUBCUTANEOUS | Status: DC
  Administered 2016-12-11: 20:00:00 40 mg via SUBCUTANEOUS
  Filled 2016-12-11: qty 0.4

## 2016-12-11 MED ORDER — DICLOFENAC SODIUM 1 % TD GEL
4.0000 g | Freq: Four times a day (QID) | TRANSDERMAL | Status: DC
  Administered 2016-12-12: 09:00:00 4 g via TOPICAL
  Filled 2016-12-11: qty 100

## 2016-12-11 MED ORDER — CALCIUM CARBONATE 600 MG PO TABS
600.0000 mg | ORAL_TABLET | Freq: Every day | ORAL | Status: DC
  Filled 2016-12-11: qty 1

## 2016-12-11 MED ORDER — PANTOPRAZOLE SODIUM 40 MG PO TBEC
40.0000 mg | DELAYED_RELEASE_TABLET | Freq: Every day | ORAL | Status: DC
  Administered 2016-12-12: 09:00:00 40 mg via ORAL
  Filled 2016-12-11: qty 1

## 2016-12-11 MED ORDER — ONDANSETRON HCL 4 MG PO TABS: 4.0000 mg | ORAL_TABLET | Freq: Four times a day (QID) | ORAL | Status: DC | PRN

## 2016-12-11 MED ORDER — MOMETASONE FURO-FORMOTEROL FUM 200-5 MCG/ACT IN AERO
2.0000 | INHALATION_SPRAY | Freq: Two times a day (BID) | RESPIRATORY_TRACT | Status: DC
Start: 2016-12-11 — End: 2016-12-12
  Administered 2016-12-11 – 2016-12-12 (×2): 2 via RESPIRATORY_TRACT
  Filled 2016-12-11: qty 8.8

## 2016-12-11 MED ORDER — ONDANSETRON HCL 4 MG/2ML IJ SOLN: 4.0000 mg | Freq: Four times a day (QID) | INTRAMUSCULAR | Status: DC | PRN

## 2016-12-11 NOTE — ED Notes (Signed)
Code  Stroke  Called  To [phone] 

## 2016-12-11 NOTE — ED Provider Notes (Signed)
[facility] Emergency Department Provider Note  ____________________________________________  Time seen: Approximately 10:31 AM  I have reviewed the triage vital signs and the nursing notes.   HISTORY  Chief Complaint Weakness    HPI Catheline Hixon is a 81 y.o. female who complains of a sudden onset of right sided heaviness and paresthesia that started at 9:00 AM today. Also reports that her head "doesn't feel right". Denies change in vision or recent trauma. No recent illness. No chest pain shortness of breath or syncope. Symptoms have been constant since onset at 9:00, no aggravating or alleviating factors. Moderate intensity. Has a history of TIA in the past.     Past Medical History:  Diagnosis Date  . Aortic valve disorders   . Arthritis   . Barrett's esophagus   . COPD (chronic obstructive pulmonary disease) ([place])   . Hypercholesteremia   . Hypothyroidism   . Impaired fasting glucose   . Intrinsic asthma, unspecified   . Obstructive sleep apnea (adult) (pediatric)   . Osteoporosis   . Pure hypercholesterolemia   . Syncope and collapse   . TIA (transient ischemic attack)   . Vitamin D deficiency      Patient Active Problem List   Diagnosis Date Noted  . Asthma, chronic, severe persistent, uncomplicated 84/16/6063  . OA (osteoarthritis) 04/04/2015  . Chronic obstructive airway disease with asthma ([place]) 11/10/2014  . History of colonic polyps 12/08/2013  . Hypersomnia with sleep apnea 04/23/2013  . Osteoporosis 10/07/2012  . Vitamin D deficiency   . Hypothyroidism   . Hyperlipidemia 08/07/2012  . Obstructive sleep apnea on CPAP 08/07/2012  . Asthma 08/07/2012     Past Surgical History:  Procedure Laterality Date  . BREAST EXCISIONAL BIOPSY Left   . COLONOSCOPY    . COLONOSCOPY WITH PROPOFOL N/A 12/14/2014   Procedure: COLONOSCOPY WITH PROPOFOL;  Surgeon: Lollie Sails, MD;  Location: [facility] ENDOSCOPY;  Service: Endoscopy;   Laterality: N/A;  . ESOPHAGOGASTRODUODENOSCOPY (EGD) WITH PROPOFOL N/A 12/14/2014   Procedure: ESOPHAGOGASTRODUODENOSCOPY (EGD) WITH PROPOFOL;  Surgeon: Lollie Sails, MD;  Location: [facility] ENDOSCOPY;  Service: Endoscopy;  Laterality: N/A;  . ESOPHAGOGASTRODUODENOSCOPY (EGD) WITH PROPOFOL N/A 10/18/2016   Procedure: ESOPHAGOGASTRODUODENOSCOPY (EGD) WITH PROPOFOL;  Surgeon: Lollie Sails, MD;  Location: [facility] ENDOSCOPY;  Service: Endoscopy;  Laterality: N/A;  . EYE SURGERY    . HIP SURGERY    . prosthesis eye implant    . REPLACEMENT UNICONDYLAR JOINT KNEE    . TONSILLECTOMY       Prior to Admission medications   Medication Sig Start Date End Date Taking? Authorizing Provider  albuterol (VENTOLIN HFA) 108 (90 BASE) MCG/ACT inhaler Inhale 2 puffs into the lungs every 6 (six) hours as needed for wheezing. 09/02/14  Yes Lucille Passy, MD  budesonide-formoterol [facility]) 160-4.5 MCG/ACT inhaler Inhale 2 puffs into the lungs 2 (two) times daily. 10/26/16  Yes Flora Lipps, MD  calcium carbonate (OS-CAL) 600 MG TABS Take 600 mg by mouth daily.   Yes [provider]  dexlansoprazole (DEXILANT) 60 MG capsule Take 60 mg by mouth daily.   Yes [provider]  diclofenac sodium (VOLTAREN) 1 % GEL Apply 4 g topically 4 (four) times daily. 02/24/15  Yes Copland, Frederico Hamman, MD  levothyroxine (SYNTHROID, LEVOTHROID) 50 MCG tablet TAKE 1 TABLET BY MOUTH  DAILY BEFORE BREAKFAST 10/31/16  Yes Lucille Passy, MD  Multiple Vitamin (MULTIVITAMIN) tablet Take 1 tablet by mouth daily.   Yes [provider]  PARoxetine (PAXIL) 30 MG tablet Take 1 tablet (30 mg total) by mouth every morning. 10/31/16  Yes Lucille Passy, MD  predniSONE (DELTASONE) 5 MG tablet Take 1 tablet (5 mg total) by mouth daily with breakfast. 10/31/16  Yes Lucille Passy, MD  simvastatin (ZOCOR) 20 MG tablet Take 1 tablet (20 mg total) by mouth at bedtime. 10/31/16  Yes Lucille Passy, MD  acetaminophen (TYLENOL) 325 MG  tablet Take 650 mg by mouth every 6 (six) hours as needed.    [provider]  ipratropium-albuterol (DUONEB) 0.5-2.5 (3) MG/3ML SOLN Take 3 mLs by nebulization every 8 (eight) hours as needed. 1 ampule 3 times a day for 3 days. Patient not taking: Reported on 12/11/2016 04/27/14   Vilinda Boehringer, MD  nitrofurantoin, macrocrystal-monohydrate, (MACROBID) 100 MG capsule Take 1 capsule (100 mg total) by mouth 2 (two) times daily. Patient not taking: Reported on 12/11/2016 11/23/16   Elby Beck, FNP  Tiotropium Bromide Monohydrate (SPIRIVA RESPIMAT) 1.25 MCG/ACT AERS Inhale 1.25 Act into the lungs 2 (two) times daily. Patient not taking: Reported on 12/11/2016 09/17/16   Flora Lipps, MD     Allergies Amoxicillin; Cefzil [cefprozil]; Cephalexin; Propoxyphene; Relafen [nabumetone]; and Talwin [pentazocine]   Family History  Problem Relation Age of Onset  . Heart disease Father   . Breast cancer Mother   . Bipolar disorder Brother     Social History Social History  Substance Use Topics  . Smoking status: Never Smoker  . Smokeless tobacco: Never Used  . Alcohol use 0.6 oz/week    1 Glasses of wine per week     Comment: occas. wine    Review of Systems  Constitutional:   No fever or chills.  ENT:   No sore throat. No rhinorrhea. Cardiovascular:   No chest pain or syncope. Respiratory:   No dyspnea or cough. Gastrointestinal:   Negative for abdominal pain, vomiting and diarrhea.  Musculoskeletal:   Negative for focal pain or swelling All other systems reviewed and are negative except as documented above in ROS and HPI.  ____________________________________________   PHYSICAL EXAM:  VITAL SIGNS: ED Triage Vitals  Enc Vitals Group     BP 12/11/16 1025 124/72     Pulse Rate 12/11/16 1025 79     Resp 12/11/16 1025 14     Temp 12/11/16 1027 98.1 F (36.7 C)     Temp Source 12/11/16 1027 Oral     SpO2 12/11/16 1025 98 %     Weight --      Height --      Head  Circumference --      Peak Flow --      Pain Score --      Pain Loc --      Pain Edu? --      Excl. in [place]? --     Vital signs reviewed, nursing assessments reviewed.   Constitutional:   Alert and oriented. Well appearing and in no distress. Eyes:   No scleral icterus.  EOMI. No nystagmus. No conjunctival pallor. PERRL. ENT   Head:   Normocephalic and atraumatic.   Nose:   No congestion/rhinnorhea.    Mouth/Throat:   MMM, no pharyngeal erythema. No peritonsillar mass.    Neck:   No meningismus. Full ROM. Hematological/Lymphatic/Immunilogical:   No cervical lymphadenopathy. Cardiovascular:   RRR. Symmetric bilateral radial and DP pulses.  No murmurs.  Respiratory:   Normal respiratory effort without tachypnea/retractions. Breath sounds are clear and equal  bilaterally. No wheezes/rales/rhonchi. Gastrointestinal:   Soft and nontender. Non distended. There is no CVA tenderness.  No rebound, rigidity, or guarding. Genitourinary:   deferred Musculoskeletal:   Normal range of motion in all extremities. No joint effusions.  No lower extremity tenderness.  No edema. Neurologic:   Normal speech and language.  Motor grossly intact. normal finger-nose-finger, no pronator drift Diminished sensation right arm and right leg cranial nerves II through XII intact NIH stroke scale 1 Skin:    Skin is warm, dry and intact. No rash noted.  No petechiae, purpura, or bullae.  ____________________________________________    LABS (pertinent positives/negatives) (all labs ordered are listed, but only abnormal results are displayed) Labs Reviewed  DIFFERENTIAL - Abnormal; Notable for the following:       Result Value   Lymphs Abs 0.9 (*)    All other components within normal limits  COMPREHENSIVE METABOLIC PANEL - Abnormal; Notable for the following:    Calcium 8.4 (*)    Alkaline Phosphatase 30 (*)    All other components within normal limits  PROTIME-INR  APTT  CBC  TROPONIN I   GLUCOSE, CAPILLARY  ETHANOL  URINE DRUG SCREEN, QUALITATIVE ([facility] ONLY)  URINALYSIS, COMPLETE ([facility]) WITH MICROSCOPIC   ____________________________________________   EKG  interpreted by me Sinus rhythm rate of 76, normal axis and intervals. Right bundle-branch block. No acute ischemic changes.  ____________________________________________    RADIOLOGY  Ct Head Code Stroke Wo Contrast  Result Date: 12/11/2016 CLINICAL DATA:  Code stroke.  Right-sided heaviness. EXAM: CT HEAD WITHOUT CONTRAST TECHNIQUE: Contiguous axial images were obtained from the base of the skull through the vertex without intravenous contrast. COMPARISON:  None. FINDINGS: Brain: Cerebral volume normal for age. Negative for hydrocephalus. Negative for acute infarct, hemorrhage, mass. Probable mild chronic microvascular ischemic change in the white matter Vascular: Negative for hyperdense vessel. Skull: Negative Sinuses/Orbits: Paranasal sinuses clear. 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 Other: None ASPECTS ([place] Stroke Program Early CT Score) - Ganglionic level infarction (caudate, lentiform nuclei, internal capsule, insula, M1-M3 cortex): 7 - Supraganglionic infarction (M4-M6 cortex): 3 Total score (0-10 with 10 being normal): 10 IMPRESSION: 1. No acute intracranial abnormality 2. ASPECTS is 10 These results were called by telephone at the time of interpretation on 12/11/2016 at 10:50 am to Dr. Carrie Mew , who verbally acknowledged these results. Electronically Signed   By: Franchot Gallo M.D.   On: 12/11/2016 10:50    ____________________________________________   PROCEDURES Procedures  ____________________________________________   DIFFERENTIAL DIAGNOSIS  TIA, ischemic stroke, intracranial hemorrhage  CLINICAL IMPRESSION / ASSESSMENT AND PLAN / ED COURSE  Pertinent labs & imaging results that were available during my care of the patient were reviewed  by me and considered in my medical decision making (see chart for details).   Patient presents with strokelike symptoms, an initial NIH score of 1 for paresthesia, but with her subjective symptoms, history of TIA and hyperlipidemia, we'll proceed with code stroke evaluation.  ----------------------------------------- 11:17 AM on 12/11/2016 -----------------------------------------  CT head discussed with radiology, negative for ischemic changes. Discussed with neurology Dr. Doy Mince after her evaluation.recommends hospitalization and further stroke workup given positive NIH scale although the patient is not a candidate for TPA or endovascular intervention. Case discussed with hospitalist for further management.  Stroke swallow screen and aspirin ordered      ____________________________________________   FINAL CLINICAL IMPRESSION(S) / ED DIAGNOSES    Final diagnoses:  Paresthesias with subjective weakness  New Prescriptions   No medications on file     Portions of this note were generated with dragon dictation software. Dictation errors may occur despite best attempts at proofreading.    Carrie Mew, MD 12/11/16 [phone]

## 2016-12-11 NOTE — H&P (Signed)
[facility] [place] NAME: Dorothy Church    MR#:  [id number]  DATE OF BIRTH:  07-19-1935  DATE OF ADMISSION:  12/11/2016  PRIMARY CARE PHYSICIAN: Lucille Passy, MD   REQUESTING/REFERRING PHYSICIAN: Dr. Brenton Grills  CHIEF COMPLAINT:   Chief Complaint  Patient presents with  . Weakness    HISTORY OF PRESENT ILLNESS:  Dorothy Church  is a 81 y.o. female with a known history of hypertension, hyperlipidemia, COPD, history of Barrett's esophagus, previous history of TIA, osteoporosis, who presents to the hospital due to right sided numbness. Patient says she woke up this morning in her usual state of health at around 9 AM she developed some right foot numbness which then progressed to the entire right side of her body being a heavy/numb. It was not improving so she came to the ER for further evaluation. A code stroke was initiated. Patient went to CT scan of the head which was negative for any acute pathology. Given the persistence of her numbness/heaviness on the right side of the body hospitalist services were contacted further treatment and evaluation. Patient denies any chest pains, shortness of breath, nausea, vomiting or abdominal pain, fever, chills or any other associated symptoms presently.  PAST MEDICAL HISTORY:   Past Medical History:  Diagnosis Date  . Aortic valve disorders   . Arthritis   . Barrett's esophagus   . COPD (chronic obstructive pulmonary disease) ([place])   . Hypercholesteremia   . Hypothyroidism   . Impaired fasting glucose   . Intrinsic asthma, unspecified   . Obstructive sleep apnea (adult) (pediatric)   . Osteoporosis   . Pure hypercholesterolemia   . Syncope and collapse   . TIA (transient ischemic attack)   . Vitamin D deficiency     PAST SURGICAL HISTORY:   Past Surgical History:  Procedure Laterality Date  . BREAST EXCISIONAL BIOPSY Left   . COLONOSCOPY    . COLONOSCOPY WITH PROPOFOL N/A  12/14/2014   Procedure: COLONOSCOPY WITH PROPOFOL;  Surgeon: Lollie Sails, MD;  Location: [facility] ENDOSCOPY;  Service: Endoscopy;  Laterality: N/A;  . ESOPHAGOGASTRODUODENOSCOPY (EGD) WITH PROPOFOL N/A 12/14/2014   Procedure: ESOPHAGOGASTRODUODENOSCOPY (EGD) WITH PROPOFOL;  Surgeon: Lollie Sails, MD;  Location: [facility] ENDOSCOPY;  Service: Endoscopy;  Laterality: N/A;  . ESOPHAGOGASTRODUODENOSCOPY (EGD) WITH PROPOFOL N/A 10/18/2016   Procedure: ESOPHAGOGASTRODUODENOSCOPY (EGD) WITH PROPOFOL;  Surgeon: Lollie Sails, MD;  Location: [facility] ENDOSCOPY;  Service: Endoscopy;  Laterality: N/A;  . EYE SURGERY    . HIP SURGERY    . prosthesis eye implant    . REPLACEMENT UNICONDYLAR JOINT KNEE    . TONSILLECTOMY      SOCIAL HISTORY:   Social History  Substance Use Topics  . Smoking status: Never Smoker  . Smokeless tobacco: Never Used  . Alcohol use 0.6 oz/week    1 Glasses of wine per week     Comment: occas. wine    FAMILY HISTORY:   Family History  Problem Relation Age of Onset  . Heart disease Father   . Alcohol abuse Father   . Breast cancer Mother   . Bipolar disorder Brother     DRUG ALLERGIES:   Allergies  Allergen Reactions  . Amoxicillin Other (See Comments)    Pencillins  . Cefzil [Cefprozil]   . Cephalexin   . Propoxyphene     Other reaction(s): Unknown  . Relafen [Nabumetone]   . Talwin [Pentazocine]     REVIEW OF  SYSTEMS:   Review of Systems  Constitutional: Negative for fever and weight loss.  HENT: Negative for congestion, nosebleeds and tinnitus.   Eyes: Negative for blurred vision, double vision and redness.  Respiratory: Negative for cough, hemoptysis and shortness of breath.   Cardiovascular: Negative for chest pain, orthopnea, leg swelling and PND.  Gastrointestinal: Negative for abdominal pain, diarrhea, melena, nausea and vomiting.  Genitourinary: Negative for dysuria, hematuria and urgency.  Musculoskeletal: Negative for falls and joint  pain.  Neurological: Positive for sensory change (right sided numbness. ). Negative for dizziness, tingling, focal weakness, seizures, weakness and headaches.  Endo/Heme/Allergies: Negative for polydipsia. Does not bruise/bleed easily.  Psychiatric/Behavioral: Negative for depression and memory loss. The patient is not nervous/anxious.     MEDICATIONS AT HOME:   Prior to Admission medications   Medication Sig Start Date End Date Taking? Authorizing Provider  albuterol (VENTOLIN HFA) 108 (90 BASE) MCG/ACT inhaler Inhale 2 puffs into the lungs every 6 (six) hours as needed for wheezing. 09/02/14  Yes Lucille Passy, MD  budesonide-formoterol [facility]) 160-4.5 MCG/ACT inhaler Inhale 2 puffs into the lungs 2 (two) times daily. 10/26/16  Yes Flora Lipps, MD  calcium carbonate (OS-CAL) 600 MG TABS Take 600 mg by mouth daily.   Yes [provider]  dexlansoprazole (DEXILANT) 60 MG capsule Take 60 mg by mouth daily.   Yes [provider]  diclofenac sodium (VOLTAREN) 1 % GEL Apply 4 g topically 4 (four) times daily. 02/24/15  Yes Copland, Frederico Hamman, MD  levothyroxine (SYNTHROID, LEVOTHROID) 50 MCG tablet TAKE 1 TABLET BY MOUTH  DAILY BEFORE BREAKFAST 10/31/16  Yes Lucille Passy, MD  Multiple Vitamin (MULTIVITAMIN) tablet Take 1 tablet by mouth daily.   Yes [provider]  PARoxetine (PAXIL) 30 MG tablet Take 1 tablet (30 mg total) by mouth every morning. 10/31/16  Yes Lucille Passy, MD  predniSONE (DELTASONE) 5 MG tablet Take 1 tablet (5 mg total) by mouth daily with breakfast. 10/31/16  Yes Lucille Passy, MD  simvastatin (ZOCOR) 20 MG tablet Take 1 tablet (20 mg total) by mouth at bedtime. 10/31/16  Yes Lucille Passy, MD  acetaminophen (TYLENOL) 325 MG tablet Take 650 mg by mouth every 6 (six) hours as needed.    [provider]  ipratropium-albuterol (DUONEB) 0.5-2.5 (3) MG/3ML SOLN Take 3 mLs by nebulization every 8 (eight) hours as needed. 1 ampule 3 times a day for 3  days. Patient not taking: Reported on 12/11/2016 04/27/14   Vilinda Boehringer, MD  nitrofurantoin, macrocrystal-monohydrate, (MACROBID) 100 MG capsule Take 1 capsule (100 mg total) by mouth 2 (two) times daily. Patient not taking: Reported on 12/11/2016 11/23/16   Elby Beck, FNP  Tiotropium Bromide Monohydrate (SPIRIVA RESPIMAT) 1.25 MCG/ACT AERS Inhale 1.25 Act into the lungs 2 (two) times daily. Patient not taking: Reported on 12/11/2016 09/17/16   Flora Lipps, MD      VITAL SIGNS:  Blood pressure 124/72, pulse 79, temperature 98.1 F (36.7 C), resp. rate 14, SpO2 98 %.  PHYSICAL EXAMINATION:  Physical Exam  GENERAL:  81 y.o.-year-old patient lying in the bed in no acute distress.  EYES: Pupils equal, round, reactive to light and accommodation. No scleral icterus. Extraocular muscles intact.  HEENT: Head atraumatic, normocephalic. Oropharynx and nasopharynx clear. No oropharyngeal erythema, moist oral mucosa  NECK:  Supple, no jugular venous distention. No thyroid enlargement, no tenderness.  LUNGS: Normal breath sounds bilaterally, no wheezing, rales, rhonchi. No use of accessory muscles of respiration.  CARDIOVASCULAR: S1, S2 RRR. No murmurs, rubs, gallops, clicks.  ABDOMEN: Soft, nontender, nondistended. Bowel sounds present. No organomegaly or mass.  EXTREMITIES: No pedal edema, cyanosis, or clubbing. + 2 pedal & radial pulses b/l.   NEUROLOGIC: Cranial nerves II through XII are intact. No focal Motor deficits b/l.  + right sided numbness compared to left.  PSYCHIATRIC: The patient is alert and oriented x 3.  SKIN: No obvious rash, lesion, or ulcer.   LABORATORY PANEL:   CBC  Recent Labs Lab 12/11/16 1028  WBC 5.7  HGB 13.6  HCT 40.6  PLT 223   ------------------------------------------------------------------------------------------------------------------  Chemistries   Recent Labs Lab 12/11/16 1028  NA 139  K 3.5  CL 105  CO2 27  GLUCOSE 98  BUN 15   CREATININE 0.83  CALCIUM 8.4*  AST 21  ALT 16  ALKPHOS 30*  BILITOT 0.8   ------------------------------------------------------------------------------------------------------------------  Cardiac Enzymes  Recent Labs Lab 12/11/16 1028  TROPONINI <0.03   ------------------------------------------------------------------------------------------------------------------  RADIOLOGY:  Ct Head Code Stroke Wo Contrast  Result Date: 12/11/2016 CLINICAL DATA:  Code stroke.  Right-sided heaviness. EXAM: CT HEAD WITHOUT CONTRAST TECHNIQUE: Contiguous axial images were obtained from the base of the skull through the vertex without intravenous contrast. COMPARISON:  None. FINDINGS: Brain: Cerebral volume normal for age. Negative for hydrocephalus. Negative for acute infarct, hemorrhage, mass. Probable mild chronic microvascular ischemic change in the white matter Vascular: Negative for hyperdense vessel. Skull: Negative Sinuses/Orbits: Paranasal sinuses clear. 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 Other: None ASPECTS ([place] Stroke Program Early CT Score) - Ganglionic level infarction (caudate, lentiform nuclei, internal capsule, insula, M1-M3 cortex): 7 - Supraganglionic infarction (M4-M6 cortex): 3 Total score (0-10 with 10 being normal): 10 IMPRESSION: 1. No acute intracranial abnormality 2. ASPECTS is 10 These results were called by telephone at the time of interpretation on 12/11/2016 at 10:50 am to Dr. Carrie Mew , who verbally acknowledged these results. Electronically Signed   By: Franchot Gallo M.D.   On: 12/11/2016 10:50     IMPRESSION AND PLAN:   Josilynn Losh  is a 81 y.o. female with a known history of hypertension, hyperlipidemia, COPD, history of Barrett's esophagus, previous history of TIA, osteoporosis, who presents to the hospital due to right sided numbness.  1. TIA/CVA-this is working diagnoses given patient's right-sided  numbness/heaviness. CT head is negative for acute pathology. -We'll get MRI of the brain, carotid duplex, echocardiogram. Start patient on baby aspirin, continue statin. -Neurology has been consulted.  2. COPD-no acute exacerbation. Continue Symbicort, albuterol inhaler as needed.  3. Hypothyroidism-continue Synthroid.  4. GERD-continue Protonix.  5. Hyperlipidemia-continue simvastatin.  6. Anxiety-continue Paxil.   All the records are reviewed and case discussed with ED provider. Management plans discussed with the patient, family and they are in agreement.  CODE STATUS: Full code  TOTAL TIME TAKING CARE OF THIS PATIENT: 40 minutes.    Henreitta Leber M.D on 12/11/2016 at 12:11 PM  Between 7am to 6pm - Pager - [phone]  After 6pm go to [email or web address] - password [email or web address] [facility]  Office  [phone]  CC: Primary care physician; Lucille Passy, MD

## 2016-12-11 NOTE — Progress Notes (Signed)
[place] responded to a PG for a Code Stroke. Pt is being assessed by the medical team on my arrival. Pt friend is bedside. CH provided the ministry of empathetic listening and prayer. [place] will follow up as needed.    12/11/16 1200  Clinical Encounter Type  Visited With Patient;Patient and family together  Visit Type Initial;Spiritual support;Code;ED (Code Stroke)  Referral From Nurse  Consult/Referral To Chaplain  Spiritual Encounters  Spiritual Needs Prayer;Emotional

## 2016-12-11 NOTE — ED Triage Notes (Signed)
Pt c/o right sided heaviness to arm and foot that started at 0900 today.  Still has this heavy feeling.  Speech seems slightly slurred at times. No facial droop noted at this time. Hx tia. Dr Joni Fears aware of pt and at bedside.

## 2016-12-11 NOTE — Progress Notes (Signed)
CH made a follow up visit. Pt was out for an MRI. CH met with twin sister. [place] will follow up as needed.    12/11/16 1300  Clinical Encounter Type  Visited With Family  Visit Type Follow-up  Consult/Referral To Chaplain
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
## 2016-12-12 ENCOUNTER — Observation Stay (HOSPITAL_BASED_OUTPATIENT_CLINIC_OR_DEPARTMENT_OTHER)
Admit: 2016-12-12 | Discharge: 2016-12-12 | Disposition: A | Discharge status: Home / Self Care | Payer: Medicare Other | Attending: Specialist | Admitting: Specialist

## 2016-12-12 DIAGNOSIS — E039 Hypothyroidism, unspecified: Secondary | ICD-10-CM | POA: Diagnosis not present

## 2016-12-12 DIAGNOSIS — I5030 Unspecified diastolic (congestive) heart failure: Secondary | ICD-10-CM | POA: Diagnosis not present

## 2016-12-12 DIAGNOSIS — K219 Gastro-esophageal reflux disease without esophagitis: Secondary | ICD-10-CM | POA: Diagnosis not present

## 2016-12-12 DIAGNOSIS — G459 Transient cerebral ischemic attack, unspecified: Secondary | ICD-10-CM | POA: Diagnosis not present

## 2016-12-12 DIAGNOSIS — J449 Chronic obstructive pulmonary disease, unspecified: Secondary | ICD-10-CM | POA: Diagnosis not present

## 2016-12-12 LAB — LIPID PANEL
CHOL/HDL RATIO: 3.1 ratio
Cholesterol: 144 mg/dL (ref 0–200)
HDL: 47 mg/dL (ref >40)
LDL Cholesterol: 75 mg/dL (ref 0–99)
Triglycerides: 109 mg/dL (ref <150)
VLDL: 22 mg/dL (ref 0–40)

## 2016-12-12 LAB — BASIC METABOLIC PANEL
Anion gap: 7 (ref 5–15)
BUN: 12 mg/dL (ref 6–20)
CALCIUM: 8.1 mg/dL — AB (ref 8.9–10.3)
CO2: 26 mmol/L (ref 22–32)
CREATININE: 0.85 mg/dL (ref 0.44–1.00)
Chloride: 107 mmol/L (ref 101–111)
GFR calc Af Amer: >60 mL/min (ref >60)
GLUCOSE: 98 mg/dL (ref 65–99)
Potassium: 3.6 mmol/L (ref 3.5–5.1)
Sodium: 140 mmol/L (ref 135–145)

## 2016-12-12 LAB — HEMOGLOBIN A1C
Hgb A1c MFr Bld: 5.3 % (ref 4.8–5.6)
Mean Plasma Glucose: 105.41 mg/dL

## 2016-12-12 LAB — CBC
HCT: 39.9 % (ref 35.0–47.0)
Hemoglobin: 13.6 g/dL (ref 12.0–16.0)
MCH: 31.1 pg (ref 26.0–34.0)
MCHC: 34.1 g/dL (ref 32.0–36.0)
MCV: 91.3 fL (ref 80.0–100.0)
Platelets: 201 10*3/uL (ref 150–440)
RBC: 4.37 MIL/uL (ref 3.80–5.20)
RDW: 12.9 % (ref 11.5–14.5)
WBC: 4.9 10*3/uL (ref 3.6–11.0)

## 2016-12-12 LAB — ECHOCARDIOGRAM COMPLETE
HEIGHTINCHES: 66 in
Weight: 2344 oz

## 2016-12-12 MED ORDER — CALCIUM CARBONATE ANTACID 500 MG PO CHEW
1.0000 | CHEWABLE_TABLET | Freq: Every day | ORAL | Status: DC
  Administered 2016-12-12: 09:00:00 200 mg via ORAL
  Filled 2016-12-12: qty 1

## 2016-12-12 MED ORDER — ASPIRIN 81 MG PO TBEC: 81.0000 mg | DELAYED_RELEASE_TABLET | Freq: Every day | ORAL | Status: DC

## 2016-12-12 NOTE — Progress Notes (Signed)
Subjective: Patient reports improvement today with only some mild tingling in her right thumb and the toes on her right foot.  No new neurological complaints.    Objective: Current vital signs: BP (!) 117/53 (BP Location: Left Arm)   Pulse 82   Temp 98.5 F (36.9 C) (Oral)   Resp 16   Ht 5\' 6"  (1.676 m)   Wt 66.5 kg (146 lb 8 oz)   SpO2 95%   BMI 23.65 kg/m  Vital signs in last 24 hours: Temp:  [97.6 F (36.4 C)-98.5 F (36.9 C)] 98.5 F (36.9 C) (10/24 0725) Pulse Rate:  [69-87] 82 (10/24 0725) Resp:  [11-18] 16 (10/24 0725) BP: (102-139)/(49-81) 117/53 (10/24 0725) SpO2:  [95 %-100 %] 95 % (10/24 0725) Weight:  [66.5 kg (146 lb 8 oz)] 66.5 kg (146 lb 8 oz) (10/23 1259)  Intake/Output from previous day: 10/23 0701 - 10/24 0700 In: 357 [P.O.:357] Out: -  Intake/Output this shift: Total I/O In: 240 [P.O.:240] Out: -  Nutritional status: Diet Heart Room service appropriate? Yes; Fluid consistency: Thin  Neurologic Exam: Mental Status: Alert, oriented, thought content appropriate.  Speech fluent without evidence of aphasia.  Able to follow 3 step commands without difficulty. Cranial Nerves: II: Discs flat bilaterally; Visual fields grossly normal, pupils equal, round, reactive to light and accommodation III,IV, VI: ptosis not present, extra-ocular motions intact bilaterally V,VII: decrease right NLF, facial light touch sensation normal bilaterally VIII: hearing normal bilaterally IX,X: gag reflex present XI: bilateral shoulder shrug XII: midline tongue extension Motor: Right : Upper extremity   5/5    Left:     Upper extremity   5/5  Lower extremity   5/5     Lower extremity   5/5 Tone and bulk:normal tone throughout; no atrophy noted Sensory: Pinprick and light touch decreased in the right thumb and toes on right foot   Lab Results: Basic Metabolic Panel:  Recent Labs Lab 12/11/16 1028 12/12/16 0548  NA 139 140  K 3.5 3.6  CL 105 107  CO2 27 26  GLUCOSE  98 98  BUN 15 12  CREATININE 0.83 0.85  CALCIUM 8.4* 8.1*    Liver Function Tests:  Recent Labs Lab 12/11/16 1028  AST 21  ALT 16  ALKPHOS 30*  BILITOT 0.8  PROT 6.7  ALBUMIN 3.7   No results for input(s): LIPASE, AMYLASE in the last 168 hours. No results for input(s): AMMONIA in the last 168 hours.  CBC:  Recent Labs Lab 12/11/16 1028 12/12/16 0548  WBC 5.7 4.9  NEUTROABS 4.0  --   HGB 13.6 13.6  HCT 40.6 39.9  MCV 90.9 91.3  PLT 223 201    Cardiac Enzymes:  Recent Labs Lab 12/11/16 1028  TROPONINI <0.03    Lipid Panel: No results for input(s): CHOL, TRIG, HDL, CHOLHDL, VLDL, LDLCALC in the last 168 hours.  CBG:  Recent Labs Lab 12/11/16 1033  GLUCAP 96    Microbiology: Results for orders placed or performed in visit on 11/23/16  Urine Culture     Status: Abnormal   Collection Time: 11/23/16  9:52 AM  Result Value Ref Range Status   MICRO NUMBER: [id number]  Final   SPECIMEN QUALITY: ADEQUATE  Final   Sample Source URINE  Final   STATUS: FINAL  Final   ISOLATE 1: Escherichia coli (A)  Final      Susceptibility   Escherichia coli - URINE CULTURE, REFLEX    AMOX/CLAVULANIC <=2 Sensitive  AMPICILLIN 4 Sensitive     AMPICILLIN/SULBACTAM <=2 Sensitive     CEFAZOLIN* <=4 Not Reportable      * For infections other than uncomplicated UTIcaused by E. coli, K. pneumoniae or P. mirabilis:Cefazolin is resistant if MIC > or = 8 mcg/mL.(Distinguishing susceptible versus intermediatefor isolates with MIC < or = 4 mcg/mL requiresadditional testing.)For uncomplicated UTI caused by E. coli,K. pneumoniae or P. mirabilis: Cefazolin issusceptible if MIC <32 mcg/mL and predictssusceptible to the oral agents cefaclor, cefdinir,cefpodoxime, cefprozil, cefuroxime, cephalexinand loracarbef.    CEFEPIME <=1 Sensitive     CEFTRIAXONE <=1 Sensitive     CIPROFLOXACIN <=0.25 Sensitive     LEVOFLOXACIN <=0.12 Sensitive     ERTAPENEM <=0.5 Sensitive     GENTAMICIN <=1  Sensitive     IMIPENEM <=0.25 Sensitive     NITROFURANTOIN <=16 Sensitive     PIP/TAZO <=4 Sensitive     TOBRAMYCIN <=1 Sensitive     TRIMETH/SULFA* <=20 Sensitive      * For infections other than uncomplicated UTIcaused by E. coli, K. pneumoniae or P. mirabilis:Cefazolin is resistant if MIC > or = 8 mcg/mL.(Distinguishing susceptible versus intermediatefor isolates with MIC < or = 4 mcg/mL requiresadditional testing.)For uncomplicated UTI caused by E. coli,K. pneumoniae or P. mirabilis: Cefazolin issusceptible if MIC <32 mcg/mL and predictssusceptible to the oral agents cefaclor, cefdinir,cefpodoxime, cefprozil, cefuroxime, cephalexinand loracarbef.Legend:S = Susceptible  I = IntermediateR = Resistant  NS = Not susceptible* = Not tested  NR = Not reported**NN = See antimicrobic comments    Coagulation Studies:  Recent Labs  12/11/16 1028  LABPROT 13.0  INR 0.99    Imaging: Mr Brain Wo Contrast  Result Date: 12/11/2016 CLINICAL DATA:  TIA EXAM: MRI HEAD WITHOUT CONTRAST TECHNIQUE: Multiplanar, multiecho pulse sequences of the brain and surrounding structures were obtained without intravenous contrast. COMPARISON:  CT head 12/11/2016 FINDINGS: Brain: Negative for acute infarct. Mild chronic white matter changes. Negative for hydrocephalus.  Negative for hemorrhage or mass. Vascular: Normal arterial flow voids. Skull and upper cervical spine: Negative Sinuses/Orbits: Mild mucosal edema in the paranasal sinuses and in the mastoid sinus bilaterally. Left eye enucleation Other: None IMPRESSION: Mild chronic microvascular ischemia.  No acute infarct. Electronically Signed   By: Franchot Gallo M.D.   On: 12/11/2016 15:02   US Carotid Bilateral  Result Date: 12/11/2016 CLINICAL DATA:  TIA. EXAM: BILATERAL CAROTID DUPLEX ULTRASOUND TECHNIQUE: Pearline Cables scale imaging, color Doppler and duplex ultrasound were performed of bilateral carotid and vertebral arteries in the neck. COMPARISON:  MRI 12/11/2016  FINDINGS: Criteria: Quantification of carotid stenosis is based on velocity parameters that correlate the residual internal carotid diameter with NASCET-based stenosis levels, using the diameter of the distal internal carotid lumen as the denominator for stenosis measurement. The following velocity measurements were obtained: RIGHT ICA:  73 cm/sec CCA:  84 cm/sec SYSTOLIC ICA/CCA RATIO:  0.9 DIASTOLIC ICA/CCA RATIO:  1.5 ECA:  78 cm/sec LEFT ICA:  77 cm/sec CCA:  86 cm/sec SYSTOLIC ICA/CCA RATIO:  0.9 DIASTOLIC ICA/CCA RATIO:  1.5 ECA:  65 cm/sec RIGHT CAROTID ARTERY: Right common carotid artery is patent without significant plaque or stenosis. There appears to be echogenic plaque in the proximal external carotid artery. External carotid artery is patent with normal waveform. Normal waveforms and velocities in the internal carotid artery. RIGHT VERTEBRAL ARTERY: Antegrade flow and normal waveform in the right vertebral artery. LEFT CAROTID ARTERY: Left common carotid artery is patent without significant plaque or stenosis. Minimal echogenic plaque at the left  carotid bulb. External carotid artery is patent with normal waveform. Normal waveforms and velocities in the internal carotid artery. LEFT VERTEBRAL ARTERY:  Not visualized. IMPRESSION: Minimal atherosclerotic plaque in the carotid arteries. No significant stenosis in the internal carotid arteries. Left vertebral artery not visualized. Patent right vertebral artery with antegrade flow. Electronically Signed   By: Markus Daft M.D.   On: 12/11/2016 15:55   Ct Head Code Stroke Wo Contrast  Result Date: 12/11/2016 CLINICAL DATA:  Code stroke.  Right-sided heaviness. EXAM: CT HEAD WITHOUT CONTRAST TECHNIQUE: Contiguous axial images were obtained from the base of the skull through the vertex without intravenous contrast. COMPARISON:  None. FINDINGS: Brain: Cerebral volume normal for age. Negative for hydrocephalus. Negative for acute infarct, hemorrhage, mass.  Probable mild chronic microvascular ischemic change in the white matter Vascular: Negative for hyperdense vessel. Skull: Negative Sinuses/Orbits: Paranasal sinuses clear. 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 Other: None ASPECTS ([place] Stroke Program Early CT Score) - Ganglionic level infarction (caudate, lentiform nuclei, internal capsule, insula, M1-M3 cortex): 7 - Supraganglionic infarction (M4-M6 cortex): 3 Total score (0-10 with 10 being normal): 10 IMPRESSION: 1. No acute intracranial abnormality 2. ASPECTS is 10 These results were called by telephone at the time of interpretation on 12/11/2016 at 10:50 am to Dr. Carrie Mew , who verbally acknowledged these results. Electronically Signed   By: Franchot Gallo M.D.   On: 12/11/2016 10:50    Medications:  I have reviewed the patient's current medications. Scheduled: . aspirin EC  81 mg Oral Daily  . calcium carbonate  1 tablet Oral Q breakfast  . diclofenac sodium  4 g Topical QID  . enoxaparin (LOVENOX) injection  40 mg Subcutaneous Q24H  . levothyroxine  50 mcg Oral QAC breakfast  . mometasone-formoterol  2 puff Inhalation BID  . multivitamin with minerals  1 tablet Oral Daily  . pantoprazole  40 mg Oral Daily  . PARoxetine  30 mg Oral q morning - 10a  . predniSONE  5 mg Oral Q breakfast  . simvastatin  20 mg Oral QHS    Assessment/Plan: Patient improved from presentation. MRI of the brain reviewed and shows no acute changes.  Suspect TIA.  Patient on no antiplatelet therapy at home.  Carotid dopplers show no evidence of hemodynamically significant stenosis.  Echocardiogram pending.  LDL (10/24/16) was 109.  Recommendations: 1.  A1c, LDL pending 2.  Echocardiogram pending 3.  If LDL>70 aggressive lipid management recommended 4.  If no cardiac source of thrombi noted on echocardiogram patient to continue on ASA daily and follow up with neurology on an outpatient basis.      LOS: 0 days    Alexis Goodell, MD Neurology [phone] 12/12/2016  10:31 AM

## 2016-12-12 NOTE — Care Management Obs Status (Signed)
[place] NOTIFICATION   Patient Details  Name: Dorothy Church MRN: [id number] Date of Birth: 1935-02-28   Medicare Observation Status Notification Given:  Yes    Jolly Mango, RN 12/12/2016, 12:03 PM

## 2016-12-12 NOTE — Discharge Summary (Signed)
[place] [facility], 81 y.o., DOB 02-25-1935, MRN [id number]. Admission date: 12/11/2016 Discharge Date 12/12/2016 Primary MD Lucille Passy, MD Admitting Physician Henreitta Leber, MD  Admission Diagnosis  Paresthesias with subjective weakness [R20.2, R53.1]  Discharge Diagnosis   Active Problems:   TIA (transient ischemic attack)   COPD without exacerbation   Hypothyroidism  GERD   Anxiety Hyperlipidemia       Dorothy Church  is a 81 y.o. female with a known history of hypertension, hyperlipidemia, COPD, history of Barrett's esophagus, previous history of TIA, osteoporosis, who presents to the hospital due to right sided numbness.patient was admitted for further evaluation. Her MRI of the brain showed no evidence of stroke. Her symptoms are mostly resolved. She is instructed to take aspirin. Her carotid Doppler showed no significant stenosis.           Consults     neurology  Significant Tests:  See full reports for all details     Mr Brain Wo Contrast  Result Date: 12/11/2016 CLINICAL DATA:  TIA EXAM: MRI HEAD WITHOUT CONTRAST TECHNIQUE: Multiplanar, multiecho pulse sequences of the brain and surrounding structures were obtained without intravenous contrast. COMPARISON:  CT head 12/11/2016 FINDINGS: Brain: Negative for acute infarct. Mild chronic white matter changes. Negative for hydrocephalus.  Negative for hemorrhage or mass. Vascular: Normal arterial flow voids. Skull and upper cervical spine: Negative Sinuses/Orbits: Mild mucosal edema in the paranasal sinuses and in the mastoid sinus bilaterally. Left eye enucleation Other: None IMPRESSION: Mild chronic microvascular ischemia.  No acute infarct. Electronically Signed   By: Franchot Gallo M.D.   On: 12/11/2016 15:02   US Carotid Bilateral  Result Date: 12/11/2016 CLINICAL DATA:  TIA. EXAM: BILATERAL CAROTID DUPLEX ULTRASOUND TECHNIQUE: Pearline Cables scale  imaging, color Doppler and duplex ultrasound were performed of bilateral carotid and vertebral arteries in the neck. COMPARISON:  MRI 12/11/2016 FINDINGS: Criteria: Quantification of carotid stenosis is based on velocity parameters that correlate the residual internal carotid diameter with NASCET-based stenosis levels, using the diameter of the distal internal carotid lumen as the denominator for stenosis measurement. The following velocity measurements were obtained: RIGHT ICA:  73 cm/sec CCA:  84 cm/sec SYSTOLIC ICA/CCA RATIO:  0.9 DIASTOLIC ICA/CCA RATIO:  1.5 ECA:  78 cm/sec LEFT ICA:  77 cm/sec CCA:  86 cm/sec SYSTOLIC ICA/CCA RATIO:  0.9 DIASTOLIC ICA/CCA RATIO:  1.5 ECA:  65 cm/sec RIGHT CAROTID ARTERY: Right common carotid artery is patent without significant plaque or stenosis. There appears to be echogenic plaque in the proximal external carotid artery. External carotid artery is patent with normal waveform. Normal waveforms and velocities in the internal carotid artery. RIGHT VERTEBRAL ARTERY: Antegrade flow and normal waveform in the right vertebral artery. LEFT CAROTID ARTERY: Left common carotid artery is patent without significant plaque or stenosis. Minimal echogenic plaque at the left carotid bulb. External carotid artery is patent with normal waveform. Normal waveforms and velocities in the internal carotid artery. LEFT VERTEBRAL ARTERY:  Not visualized. IMPRESSION: Minimal atherosclerotic plaque in the carotid arteries. No significant stenosis in the internal carotid arteries. Left vertebral artery not visualized. Patent right vertebral artery with antegrade flow. Electronically Signed   By: Markus Daft M.D.   On: 12/11/2016 15:55   Ct Head Code Stroke Wo Contrast  Result Date: 12/11/2016 CLINICAL DATA:  Code stroke.  Right-sided heaviness. EXAM: CT HEAD WITHOUT CONTRAST TECHNIQUE: Contiguous axial images were obtained from the base of the  skull through the vertex without intravenous contrast.  COMPARISON:  None. FINDINGS: Brain: Cerebral volume normal for age. Negative for hydrocephalus. Negative for acute infarct, hemorrhage, mass. Probable mild chronic microvascular ischemic change in the white matter Vascular: Negative for hyperdense vessel. Skull: Negative Sinuses/Orbits: Paranasal sinuses clear. 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 Other: None ASPECTS ([place] Stroke Program Early CT Score) - Ganglionic level infarction (caudate, lentiform nuclei, internal capsule, insula, M1-M3 cortex): 7 - Supraganglionic infarction (M4-M6 cortex): 3 Total score (0-10 with 10 being normal): 10 IMPRESSION: 1. No acute intracranial abnormality 2. ASPECTS is 10 These results were called by telephone at the time of interpretation on 12/11/2016 at 10:50 am to Dr. Carrie Mew , who verbally acknowledged these results. Electronically Signed   By: Franchot Gallo M.D.   On: 12/11/2016 10:50       Today   Subjective:   Dorothy Church patient feels well denies any complaints  Objective:   Blood pressure (!) 117/53, pulse 82, temperature 98.5 F (36.9 C), temperature source Oral, resp. rate 16, height 5\' 6"  (1.676 m), weight 146 lb 8 oz (66.5 kg), SpO2 95 %.  .  Intake/Output Summary (Last 24 hours) at 12/12/16 1557 Last data filed at 12/12/16 1354  Gross per 24 hour  Intake              837 ml  Output                0 ml  Net              837 ml    Exam VITAL SIGNS: Blood pressure (!) 117/53, pulse 82, temperature 98.5 F (36.9 C), temperature source Oral, resp. rate 16, height 5\' 6"  (1.676 m), weight 146 lb 8 oz (66.5 kg), SpO2 95 %.  GENERAL:  81 y.o.-year-old patient lying in the bed with no acute distress.  EYES: Pupils equal, round, reactive to light and accommodation. No scleral icterus. Extraocular muscles intact.  HEENT: Head atraumatic, normocephalic. Oropharynx and nasopharynx clear.  NECK:  Supple, no jugular venous distention. No thyroid  enlargement, no tenderness.  LUNGS: Normal breath sounds bilaterally, no wheezing, rales,rhonchi or crepitation. No use of accessory muscles of respiration.  CARDIOVASCULAR: S1, S2 normal. No murmurs, rubs, or gallops.  ABDOMEN: Soft, nontender, nondistended. Bowel sounds present. No organomegaly or mass.  EXTREMITIES: No pedal edema, cyanosis, or clubbing.  NEUROLOGIC: Cranial nerves II through XII are intact. Muscle strength 5/5 in all extremities. Sensation intact. Gait not checked.  PSYCHIATRIC: The patient is alert and oriented x 3.  SKIN: No obvious rash, lesion, or ulcer.   Data Review     CBC w Diff: Lab Results  Component Value Date   WBC 4.9 12/12/2016   HGB 13.6 12/12/2016   HGB 14.0 03/01/2014   HCT 39.9 12/12/2016   HCT 42.6 03/01/2014   PLT 201 12/12/2016   PLT 190 03/01/2014   LYMPHOPCT 16 12/11/2016   MONOPCT 9 12/11/2016   EOSPCT 5 12/11/2016   BASOPCT 0 12/11/2016   CMP: Lab Results  Component Value Date   NA 140 12/12/2016   NA 144 03/01/2014   K 3.6 12/12/2016   K 3.9 03/01/2014   CL 107 12/12/2016   CL 108 (H) 03/01/2014   CO2 26 12/12/2016   CO2 30 03/01/2014   BUN 12 12/12/2016   BUN 10 03/01/2014   CREATININE 0.85 12/12/2016   CREATININE 0.75 03/01/2014   GLU  87 07/21/2012   PROT 6.7 12/11/2016   PROT 7.0 02/26/2011   ALBUMIN 3.7 12/11/2016   ALBUMIN 4.0 02/26/2011   BILITOT 0.8 12/11/2016   BILITOT 0.9 02/26/2011   ALKPHOS 30 (L) 12/11/2016   ALKPHOS 36 (L) 02/26/2011   AST 21 12/11/2016   AST 21 02/26/2011   ALT 16 12/11/2016   ALT 20 02/26/2011  .  Micro Results No results found for this or any previous visit (from the past 240 hour(s)).      Code Status Orders        Start     Ordered   12/11/16 1315  Full code  Continuous     12/11/16 1314    Code Status History    Date Active Date Inactive Code Status Order ID Comments User Context   This patient has a current code status but no historical code status.    Advance  Directive Documentation     Most Recent Value  Type of Advance Directive  Healthcare Power of Attorney, Living will  Pre-existing out of facility DNR order (yellow form or pink MOST form)  -  "MOST" Form in Place?  -          Follow-up Information    Lucille Passy, MD On 12/19/2016.   Specialty:  Family Medicine Why:  at 11:30 a.m. for hosp f/u Contact information: [place] [address] [phone]           Discharge Medications   Allergies as of 12/12/2016      Reactions   Amoxicillin Other (See Comments)   Pencillins   Cefzil [cefprozil]    Cephalexin    Propoxyphene    Other reaction(s): Unknown   Relafen [nabumetone]    Talwin [pentazocine]       Medication List    TAKE these medications   acetaminophen 325 MG tablet Commonly known as:  TYLENOL Take 650 mg by mouth every 6 (six) hours as needed.   albuterol 108 (90 Base) MCG/ACT inhaler Commonly known as:  VENTOLIN HFA Inhale 2 puffs into the lungs every 6 (six) hours as needed for wheezing.   aspirin 81 MG EC tablet Take 1 tablet (81 mg total) by mouth daily.   budesonide-formoterol 160-4.5 MCG/ACT inhaler Commonly known as:  SYMBICORT Inhale 2 puffs into the lungs 2 (two) times daily.   calcium carbonate 600 MG Tabs tablet Commonly known as:  OS-CAL Take 600 mg by mouth daily.   DEXILANT 60 MG capsule Generic drug:  dexlansoprazole Take 60 mg by mouth daily.   diclofenac sodium 1 % Gel Commonly known as:  VOLTAREN Apply 4 g topically 4 (four) times daily.   ipratropium-albuterol 0.5-2.5 (3) MG/3ML Soln Commonly known as:  DUONEB Take 3 mLs by nebulization every 8 (eight) hours as needed. 1 ampule 3 times a day for 3 days.   levothyroxine 50 MCG tablet Commonly known as:  SYNTHROID, LEVOTHROID TAKE 1 TABLET BY MOUTH  DAILY BEFORE BREAKFAST   multivitamin tablet Take 1 tablet by mouth daily.   nitrofurantoin (macrocrystal-monohydrate) 100 MG capsule Commonly known  as:  MACROBID Take 1 capsule (100 mg total) by mouth 2 (two) times daily.   PARoxetine 30 MG tablet Commonly known as:  PAXIL Take 1 tablet (30 mg total) by mouth every morning.   predniSONE 5 MG tablet Commonly known as:  DELTASONE Take 1 tablet (5 mg total) by mouth daily with breakfast.   simvastatin 20 MG tablet Commonly known  as:  ZOCOR Take 1 tablet (20 mg total) by mouth at bedtime.   Tiotropium Bromide Monohydrate 1.25 MCG/ACT Aers Commonly known as:  SPIRIVA RESPIMAT Inhale 1.25 Act into the lungs 2 (two) times daily.          Total Time in preparing paper work, data evaluation and todays exam - 35 minutes  Dustin Flock M.D on 12/12/2016 at 3:57 PM  [facility]   Office  [phone]

## 2016-12-12 NOTE — Discharge Instructions (Signed)
[facility] - [place] at [facility] ° °DIET:  °Cardiac diet ° °DISCHARGE CONDITION:  °Stable ° °ACTIVITY:  °Activity as tolerated ° °OXYGEN:  °Home Oxygen: No. °  °Oxygen Delivery: room air ° °DISCHARGE LOCATION:  °home  ° ° °ADDITIONAL DISCHARGE INSTRUCTION: ° ° °If you experience worsening of your admission symptoms, develop shortness of breath, life threatening emergency, suicidal or homicidal thoughts you must seek medical attention immediately by calling 911 or calling your MD immediately  if symptoms less severe. ° °You Must read complete instructions/literature along with all the possible adverse reactions/side effects for all the Medicines you take and that have been prescribed to you. Take any new Medicines after you have completely understood and accpet all the possible adverse reactions/side effects.  ° °Please note ° °You were cared for by a hospitalist during your hospital stay. If you have any questions about your discharge medications or the care you received while you were in the hospital after you are discharged, you can call the unit and asked to speak with the hospitalist on call if the hospitalist that took care of you is not available. Once you are discharged, your primary care physician will handle any further medical issues. Please note that NO REFILLS for any discharge medications will be authorized once you are discharged, as it is imperative that you return to your primary care physician (or establish a relationship with a primary care physician if you do not have one) for your aftercare needs so that they can reassess your need for medications and monitor your lab values. ° ° °

## 2016-12-12 NOTE — Progress Notes (Signed)
Discharge paperwork reviewed with patient who verbalized understanding. Follow up apt, medication changes explained. Pt is stable and ready for discharge. Pt sister to transport home.

## 2016-12-12 NOTE — Progress Notes (Signed)
*  PRELIMINARY RESULTS* Echocardiogram 2D Echocardiogram has been performed.  Sherrie Sport 12/12/2016, 11:31 AM

## 2016-12-14 ENCOUNTER — Ambulatory Visit: Payer: Medicare Other | Admitting: Internal Medicine

## 2016-12-19 ENCOUNTER — Ambulatory Visit: Payer: Medicare Other | Admitting: Family Medicine

## 2016-12-25 ENCOUNTER — Encounter: Payer: Self-pay | Admitting: Family Medicine

## 2016-12-25 ENCOUNTER — Ambulatory Visit (INDEPENDENT_AMBULATORY_CARE_PROVIDER_SITE_OTHER): Payer: Medicare Other | Admitting: Family Medicine

## 2016-12-25 VITALS — BP 110/64 | HR 94 | Temp 98.6°F | Ht 66.0 in | Wt 147.8 lb

## 2016-12-25 DIAGNOSIS — G459 Transient cerebral ischemic attack, unspecified: Secondary | ICD-10-CM | POA: Diagnosis not present

## 2016-12-25 NOTE — Progress Notes (Signed)
Subjective:   Patient ID: Dorothy Church, female    DOB: [birth year], 81 y.o.   MRN: [id number]  Dorothy Church is a pleasant 81 y.o. year old female who presents to clinic today with Hospitalization Follow-up (Patient is here today for a hospital follow-up from [facility].  She started with numbness from toes up on right side.  Possible TIA but no official Dx.)  on 12/25/2016  HPI:  Admitted to [facility] 10/23- 12/12/16. Notes reviewed.  Presented with weakness and right sided numbness. Numbness from right side of her face "down to her toes."  No muscle weakness or facial droop.  No slurred speech.  Admitted for further evaluation- MRI neg, carotid dopplers neg.  Diagnosed with TIA and advised to take ASA. Symptoms have resolved completely and had resolved prior to discharge.  Mr Brain Wo Contrast  Result Date: 12/11/2016 CLINICAL DATA:  TIA EXAM: MRI HEAD WITHOUT CONTRAST TECHNIQUE: Multiplanar, multiecho pulse sequences of the brain and surrounding structures were obtained without intravenous contrast. COMPARISON:  CT head 12/11/2016 FINDINGS: Brain: Negative for acute infarct. Mild chronic white matter changes. Negative for hydrocephalus.  Negative for hemorrhage or mass. Vascular: Normal arterial flow voids. Skull and upper cervical spine: Negative Sinuses/Orbits: Mild mucosal edema in the paranasal sinuses and in the mastoid sinus bilaterally. Left eye enucleation Other: None IMPRESSION: Mild chronic microvascular ischemia.  No acute infarct. Electronically Signed   By: Franchot Gallo M.D.   On: 12/11/2016 15:02   US Carotid Bilateral  Result Date: 12/11/2016 CLINICAL DATA:  TIA. EXAM: BILATERAL CAROTID DUPLEX ULTRASOUND TECHNIQUE: Pearline Cables scale imaging, color Doppler and duplex ultrasound were performed of bilateral carotid and vertebral arteries in the neck. COMPARISON:  MRI 12/11/2016 FINDINGS: Criteria: Quantification of carotid stenosis is based on velocity parameters that correlate the  residual internal carotid diameter with NASCET-based stenosis levels, using the diameter of the distal internal carotid lumen as the denominator for stenosis measurement. The following velocity measurements were obtained: RIGHT ICA:  73 cm/sec CCA:  84 cm/sec SYSTOLIC ICA/CCA RATIO:  0.9 DIASTOLIC ICA/CCA RATIO:  1.5 ECA:  78 cm/sec LEFT ICA:  77 cm/sec CCA:  86 cm/sec SYSTOLIC ICA/CCA RATIO:  0.9 DIASTOLIC ICA/CCA RATIO:  1.5 ECA:  65 cm/sec RIGHT CAROTID ARTERY: Right common carotid artery is patent without significant plaque or stenosis. There appears to be echogenic plaque in the proximal external carotid artery. External carotid artery is patent with normal waveform. Normal waveforms and velocities in the internal carotid artery. RIGHT VERTEBRAL ARTERY: Antegrade flow and normal waveform in the right vertebral artery. LEFT CAROTID ARTERY: Left common carotid artery is patent without significant plaque or stenosis. Minimal echogenic plaque at the left carotid bulb. External carotid artery is patent with normal waveform. Normal waveforms and velocities in the internal carotid artery. LEFT VERTEBRAL ARTERY:  Not visualized. IMPRESSION: Minimal atherosclerotic plaque in the carotid arteries. No significant stenosis in the internal carotid arteries. Left vertebral artery not visualized. Patent right vertebral artery with antegrade flow. Electronically Signed   By: Markus Daft M.D.   On: 12/11/2016 15:55   Ct Head Code Stroke Wo Contrast  Result Date: 12/11/2016 CLINICAL DATA:  Code stroke.  Right-sided heaviness. EXAM: CT HEAD WITHOUT CONTRAST TECHNIQUE: Contiguous axial images were obtained from the base of the skull through the vertex without intravenous contrast. COMPARISON:  None. FINDINGS: Brain: Cerebral volume normal for age. Negative for hydrocephalus. Negative for acute infarct, hemorrhage, mass. Probable mild chronic microvascular ischemic change in the white matter Vascular: Negative  for hyperdense  vessel. Skull: Negative Sinuses/Orbits: Paranasal sinuses clear. 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 Other: None ASPECTS ([place] Stroke Program Early CT Score) - Ganglionic level infarction (caudate, lentiform nuclei, internal capsule, insula, M1-M3 cortex): 7 - Supraganglionic infarction (M4-M6 cortex): 3 Total score (0-10 with 10 being normal): 10 IMPRESSION: 1. No acute intracranial abnormality 2. ASPECTS is 10 These results were called by telephone at the time of interpretation on 12/11/2016 at 10:50 am to Dr. Carrie Mew , who verbally acknowledged these results. Electronically Signed   By: Franchot Gallo M.D.   On: 12/11/2016 10:50        Current Outpatient Medications on File Prior to Visit  Medication Sig Dispense Refill  . acetaminophen (TYLENOL) 325 MG tablet Take 650 mg by mouth every 6 (six) hours as needed.    Marland Kitchen albuterol (VENTOLIN HFA) 108 (90 BASE) MCG/ACT inhaler Inhale 2 puffs into the lungs every 6 (six) hours as needed for wheezing. 18 g 5  . aspirin EC 81 MG EC tablet Take 1 tablet (81 mg total) by mouth daily.    . budesonide-formoterol (SYMBICORT) 160-4.5 MCG/ACT inhaler Inhale 2 puffs into the lungs 2 (two) times daily. 3 Inhaler 3  . calcium carbonate (OS-CAL) 600 MG TABS Take 600 mg by mouth daily.    Marland Kitchen dexlansoprazole (DEXILANT) 60 MG capsule Take 60 mg by mouth daily.    . diclofenac sodium (VOLTAREN) 1 % GEL Apply 4 g topically 4 (four) times daily. 5 Tube 11  . levothyroxine (SYNTHROID, LEVOTHROID) 50 MCG tablet TAKE 1 TABLET BY MOUTH  DAILY BEFORE BREAKFAST 90 tablet 3  . Multiple Vitamin (MULTIVITAMIN) tablet Take 1 tablet by mouth daily.    Marland Kitchen PARoxetine (PAXIL) 30 MG tablet Take 1 tablet (30 mg total) by mouth every morning. 90 tablet 3  . predniSONE (DELTASONE) 5 MG tablet Take 1 tablet (5 mg total) by mouth daily with breakfast. 90 tablet 3  . simvastatin (ZOCOR) 20 MG tablet Take 1 tablet (20 mg total) by mouth at  bedtime. 90 tablet 3   Current Facility-Administered Medications on File Prior to Visit  Medication Dose Route Frequency Provider Last Rate Last Dose  . denosumab (PROLIA) injection 60 mg  60 mg Subcutaneous Q6 months Lucille Passy, MD   60 mg at 12/13/15 1422    Allergies  Allergen Reactions  . Amoxicillin Other (See Comments)    Pencillins  . Cefzil [Cefprozil]   . Cephalexin   . Propoxyphene     Other reaction(s): Unknown  . Relafen [Nabumetone]   . Talwin [Pentazocine]     Past Medical History:  Diagnosis Date  . Aortic valve disorders   . Arthritis   . Barrett's esophagus   . COPD (chronic obstructive pulmonary disease) ([place])   . Hypercholesteremia   . Hypothyroidism   . Impaired fasting glucose   . Intrinsic asthma, unspecified   . Obstructive sleep apnea (adult) (pediatric)   . Osteoporosis   . Pure hypercholesterolemia   . Syncope and collapse   . TIA (transient ischemic attack)   . Vitamin D deficiency     Past Surgical History:  Procedure Laterality Date  . BREAST EXCISIONAL BIOPSY Left   . COLONOSCOPY    . EYE SURGERY    . HIP SURGERY    . prosthesis eye implant    . REPLACEMENT UNICONDYLAR JOINT KNEE    . TONSILLECTOMY      Family History  Problem Relation Age of Onset  . Heart disease Father   . Alcohol abuse Father   . Breast cancer Mother   . Bipolar disorder Brother     Social History   Socioeconomic History  . Marital status: Divorced    Spouse name: Not on file  . Number of children: Not on file  . Years of education: Not on file  . Highest education level: Not on file  Social Needs  . Financial resource strain: Not on file  . Food insecurity - worry: Not on file  . Food insecurity - inability: Not on file  . Transportation needs - medical: Not on file  . Transportation needs - non-medical: Not on file  Occupational History  . Not on file  Tobacco Use  . Smoking status: Never Smoker  . Smokeless tobacco: Never Used    Substance and Sexual Activity  . Alcohol use: Yes    Alcohol/week: 0.6 oz    Types: 1 Glasses of wine per week    Comment: occas. wine  . Drug use: No  . Sexual activity: No  Other Topics Concern  . Not on file  Social History Narrative   Lived in [place], moved to [place] two years ago.   Divorced.      Has a living will.   Would desire CPR, would not desire extraordinary measures.         The PMH, PSH, Social History, Family History, Medications, and allergies have been reviewed in [facility] [employer] and have been updated if relevant.    Review of Systems  Constitutional: Negative.   Eyes: Negative.   Respiratory: Negative.   Cardiovascular: Negative.   Gastrointestinal: Negative.   Musculoskeletal: Negative.   Neurological: Negative.   Hematological: Negative.   Psychiatric/Behavioral: Negative.   All other systems reviewed and are negative.      Objective:    BP 110/64 (BP Location: Left Arm, Patient Position: Sitting, Cuff Size: Normal)   Pulse 94   Temp 98.6 F (37 C) (Oral)   Ht 5\' 6"  (1.676 m)   Wt 147 lb 12.8 oz (67 kg)   SpO2 95%   BMI 23.86 kg/m    Physical Exam  Constitutional: She is oriented to person, place, and time. She appears well-developed and well-nourished. No distress.  HENT:  Head: Normocephalic and atraumatic.  Eyes: Conjunctivae are normal.  Cardiovascular: Normal rate and regular rhythm.  Pulmonary/Chest: Breath sounds normal.  Musculoskeletal: Normal range of motion.  Neurological: She is alert and oriented to person, place, and time. No cranial nerve deficit. She exhibits normal muscle tone. Coordination normal.  Skin: Skin is warm and dry. She is not diaphoretic.  Psychiatric: She has a normal mood and affect. Her behavior is normal. Judgment and thought content normal.  Nursing note and vitals reviewed.         Assessment & Plan:   TIA (transient ischemic attack) No Follow-up on file.

## 2016-12-25 NOTE — Assessment & Plan Note (Signed)
Asymptomatic now. Continue ASA. The patient indicates understanding of these issues and agrees with the plan.

## 2016-12-31 ENCOUNTER — Telehealth: Payer: Self-pay | Admitting: *Deleted

## 2016-12-31 NOTE — Telephone Encounter (Signed)
Information has been submitted to pts insurance for verification of benefits. Awaiting response for coverage  

## 2017-01-15 ENCOUNTER — Telehealth: Payer: Self-pay | Admitting: Internal Medicine

## 2017-01-15 NOTE — Telephone Encounter (Signed)
Lm on pts vm requesting a call back 

## 2017-01-15 NOTE — Telephone Encounter (Signed)
Spoke with Jonni Sanger at [facility] and informed him of pt's response and he states he has sent one out already. He states he will send another one out today. Called pt and informed her. Nothing further needed

## 2017-01-15 NOTE — Telephone Encounter (Signed)
Pt calling stating she contacted the [facility] to get SD card from her machine. They told her they would send a new one once and she would send in her old one. This way we can read her sleep   They were to send her one before she can send hers  She states she's called three different times now.   She is calling to let us know and to see if we have any advise

## 2017-01-15 NOTE — Telephone Encounter (Signed)
Verification of benefits have been processed and an approval has been received for pts prolia injection. Pts estimated cost are appx $0. This is only an estimate and cannot be confirmed until benefits are paid. Please advise pt and schedule if needed. If scheduled, once the injection is received, pls contact me back with the date it was received so that I am able to update prolia folder. thanks  

## 2017-01-22 ENCOUNTER — Other Ambulatory Visit (INDEPENDENT_AMBULATORY_CARE_PROVIDER_SITE_OTHER): Payer: Medicare Other

## 2017-01-22 DIAGNOSIS — M5442 Lumbago with sciatica, left side: Secondary | ICD-10-CM | POA: Diagnosis not present

## 2017-01-22 DIAGNOSIS — M5136 Other intervertebral disc degeneration, lumbar region: Secondary | ICD-10-CM | POA: Diagnosis not present

## 2017-01-22 DIAGNOSIS — M810 Age-related osteoporosis without current pathological fracture: Secondary | ICD-10-CM

## 2017-01-22 DIAGNOSIS — M51369 Other intervertebral disc degeneration, lumbar region without mention of lumbar back pain or lower extremity pain: Secondary | ICD-10-CM

## 2017-01-22 DIAGNOSIS — M5441 Lumbago with sciatica, right side: Secondary | ICD-10-CM | POA: Diagnosis not present

## 2017-01-22 HISTORY — DX: Other intervertebral disc degeneration, lumbar region: M51.36

## 2017-01-22 HISTORY — DX: Other intervertebral disc degeneration, lumbar region without mention of lumbar back pain or lower extremity pain: M51.369

## 2017-01-22 LAB — CALCIUM: CALCIUM: 9.2 mg/dL (ref 8.4–10.5)

## 2017-01-29 ENCOUNTER — Encounter: Payer: Self-pay | Admitting: Internal Medicine

## 2017-02-06 ENCOUNTER — Ambulatory Visit (INDEPENDENT_AMBULATORY_CARE_PROVIDER_SITE_OTHER): Payer: Medicare Other | Admitting: Family Medicine

## 2017-02-06 ENCOUNTER — Encounter: Payer: Self-pay | Admitting: Family Medicine

## 2017-02-06 VITALS — BP 110/70 | HR 90 | Temp 98.0°F | Wt 148.0 lb

## 2017-02-06 DIAGNOSIS — G43909 Migraine, unspecified, not intractable, without status migrainosus: Secondary | ICD-10-CM | POA: Diagnosis not present

## 2017-02-06 DIAGNOSIS — M5136 Other intervertebral disc degeneration, lumbar region: Secondary | ICD-10-CM | POA: Diagnosis not present

## 2017-02-06 DIAGNOSIS — G4701 Insomnia due to medical condition: Secondary | ICD-10-CM

## 2017-02-06 DIAGNOSIS — G8929 Other chronic pain: Secondary | ICD-10-CM

## 2017-02-06 DIAGNOSIS — M5440 Lumbago with sciatica, unspecified side: Secondary | ICD-10-CM | POA: Diagnosis not present

## 2017-02-06 DIAGNOSIS — Z7689 Persons encountering health services in other specified circumstances: Secondary | ICD-10-CM

## 2017-02-06 NOTE — Progress Notes (Deleted)
   Subjective:    Patient ID: Dorothy Church, female    DOB: 08/29/1935, 81 y.o.   MRN: [id number]  HPI This is a 81 yo female who presents today to establish care.  She has been having more back pain recently and is scheduled to have an injection 02/15/17. Is having more difficulty driving and getting dressed. No PT in several years.  For about 8 months has had more difficulty getting to sleep. Gets up 1-2 times a night to urinate. Some pain in leg/back.   Past Medical History:  Diagnosis Date  . Aortic valve disorders   . Arthritis   . Barrett's esophagus   . COPD (chronic obstructive pulmonary disease) ([place])   . Hypercholesteremia   . Hypothyroidism   . Impaired fasting glucose   . Intrinsic asthma, unspecified   . Obstructive sleep apnea (adult) (pediatric)   . Osteoporosis   . Pure hypercholesterolemia   . Syncope and collapse   . TIA (transient ischemic attack)   . Vitamin D deficiency    Past Surgical History:  Procedure Laterality Date  . BREAST EXCISIONAL BIOPSY Left   . COLONOSCOPY    . COLONOSCOPY WITH PROPOFOL N/A 12/14/2014   Procedure: COLONOSCOPY WITH PROPOFOL;  Surgeon: Lollie Sails, MD;  Location: [facility] ENDOSCOPY;  Service: Endoscopy;  Laterality: N/A;  . ESOPHAGOGASTRODUODENOSCOPY (EGD) WITH PROPOFOL N/A 12/14/2014   Procedure: ESOPHAGOGASTRODUODENOSCOPY (EGD) WITH PROPOFOL;  Surgeon: Lollie Sails, MD;  Location: [facility] ENDOSCOPY;  Service: Endoscopy;  Laterality: N/A;  . ESOPHAGOGASTRODUODENOSCOPY (EGD) WITH PROPOFOL N/A 10/18/2016   Procedure: ESOPHAGOGASTRODUODENOSCOPY (EGD) WITH PROPOFOL;  Surgeon: Lollie Sails, MD;  Location: [facility] ENDOSCOPY;  Service: Endoscopy;  Laterality: N/A;  . EYE SURGERY    . HIP SURGERY    . prosthesis eye implant    . REPLACEMENT UNICONDYLAR JOINT KNEE    . TONSILLECTOMY     Family History  Problem Relation Age of Onset  . Heart disease Father   . Alcohol abuse Father   . Breast cancer Mother   . Bipolar  disorder Brother    Social History   Tobacco Use  . Smoking status: Never Smoker  . Smokeless tobacco: Never Used  Substance Use Topics  . Alcohol use: Yes    Alcohol/week: 0.6 oz    Types: 1 Glasses of wine per week    Comment: occas. wine  . Drug use: No      Review of Systems     Objective:   Physical Exam    BP 110/70 (BP Location: Right Arm, Patient Position: Sitting, Cuff Size: Normal)   Pulse 90   Temp 98 F (36.7 C) (Oral)   Wt 148 lb (67.1 kg)   SpO2 97%   BMI 23.89 kg/m  Wt Readings from Last 3 Encounters:  02/06/17 148 lb (67.1 kg)  12/25/16 147 lb 12.8 oz (67 kg)  12/11/16 146 lb 8 oz (66.5 kg)       Assessment & Plan:

## 2017-02-06 NOTE — Patient Instructions (Addendum)
Please try melatonin 1 to 2 mg every evening about 1 hour before you want to go to bed.   Change to decaf coke instead of regular.   Please follow up in 4 months.   Can try two alleve instead of ibuprofen/Advil for migraine

## 2017-02-06 NOTE — Progress Notes (Deleted)
   Subjective:    Patient ID: Dorothy Church, female    DOB: [birth year], 81 y.o.   MRN: [id number]  HPI    Review of Systems     Objective:   Physical Exam        Assessment & Plan:

## 2017-02-06 NOTE — Progress Notes (Signed)
Subjective:    Patient ID: Dorothy Church, female    DOB: 12-05-1935, 81 y.o.   MRN: [id number]  HPI This is a 81 yo female who presents today to establish care. She is transferring from Dr. Deborra Medina. She lives at [facility].   She has been having more back pain with sciatica recently and is scheduled to have an injection 02/15/17. Is having more difficulty driving and getting dressed. No PT in several years. Is changing insurance to [employer] at beginning of year and doesn't think PT covered well.   For about 8 months has had more difficulty getting to sleep. Gets up 1-2 times a night to urinate. Able to go back to sleep. Some pain in leg/back. Has been drinking regular Coke several afternoons a week. Feels some increased stress since her sister moved to town and with back/leg pain.   Has migraine about every 6 weeks; usually relieved with Advil migraine, more difficult if she awakens with it.   Past Medical History:  Diagnosis Date  . Aortic valve disorders   . Arthritis   . Barrett's esophagus   . COPD (chronic obstructive pulmonary disease) ([place])   . Hypercholesteremia   . Hypothyroidism   . Impaired fasting glucose   . Intrinsic asthma, unspecified   . Obstructive sleep apnea (adult) (pediatric)   . Osteoporosis   . Pure hypercholesterolemia   . Syncope and collapse   . TIA (transient ischemic attack)   . Vitamin D deficiency    Past Surgical History:  Procedure Laterality Date  . BREAST EXCISIONAL BIOPSY Left   . COLONOSCOPY    . COLONOSCOPY WITH PROPOFOL N/A 12/14/2014   Procedure: COLONOSCOPY WITH PROPOFOL;  Surgeon: Lollie Sails, MD;  Location: [facility] ENDOSCOPY;  Service: Endoscopy;  Laterality: N/A;  . ESOPHAGOGASTRODUODENOSCOPY (EGD) WITH PROPOFOL N/A 12/14/2014   Procedure: ESOPHAGOGASTRODUODENOSCOPY (EGD) WITH PROPOFOL;  Surgeon: Lollie Sails, MD;  Location: [facility] ENDOSCOPY;  Service: Endoscopy;  Laterality: N/A;  . ESOPHAGOGASTRODUODENOSCOPY (EGD) WITH PROPOFOL  N/A 10/18/2016   Procedure: ESOPHAGOGASTRODUODENOSCOPY (EGD) WITH PROPOFOL;  Surgeon: Lollie Sails, MD;  Location: [facility] ENDOSCOPY;  Service: Endoscopy;  Laterality: N/A;  . EYE SURGERY    . HIP SURGERY    . prosthesis eye implant    . REPLACEMENT UNICONDYLAR JOINT KNEE    . TONSILLECTOMY     Family History  Problem Relation Age of Onset  . Heart disease Father   . Alcohol abuse Father   . Breast cancer Mother   . Bipolar disorder Brother    Social History   Tobacco Use  . Smoking status: Never Smoker  . Smokeless tobacco: Never Used  Substance Use Topics  . Alcohol use: Yes    Alcohol/week: 0.6 oz    Types: 1 Glasses of wine per week    Comment: occas. wine  . Drug use: No      Review of Systems Per HPI    Objective:   Physical Exam  Constitutional: She is oriented to person, place, and time. She appears well-developed and well-nourished. No distress.  HENT:  Head: Normocephalic and atraumatic.  Cardiovascular: Normal rate, regular rhythm and normal heart sounds.  Pulmonary/Chest: Effort normal and breath sounds normal.  Neurological: She is alert and oriented to person, place, and time.  Skin: Skin is warm and dry. She is not diaphoretic.  Psychiatric: She has a normal mood and affect. Her behavior is normal. Judgment and thought content normal.  Vitals reviewed.     BP  110/70 (BP Location: Right Arm, Patient Position: Sitting, Cuff Size: Normal)   Pulse 90   Temp 98 F (36.7 C) (Oral)   Wt 148 lb (67.1 kg)   SpO2 97%   BMI 23.89 kg/m  Wt Readings from Last 3 Encounters:  02/06/17 148 lb (67.1 kg)  12/25/16 147 lb 12.8 oz (67 kg)  12/11/16 146 lb 8 oz (66.5 kg)       Assessment & Plan:  1. Encounter to establish care - follow up in 4 months, sooner if new symptoms develop  2. Chronic bilateral low back pain with sciatica, sciatica laterality unspecified - has injection scheduled, discussed stretching/strengtheing exercises and encouraged her  to look into available classes at [facility] such as chair yoga and functional fitness  3. DDD (degenerative disc disease), lumbar - see #2  4. Migraine without status migrainosus, not intractable, unspecified migraine type - Fairly infrequent, suggested she try Alleve instead of Advil   5. Insomnia due to medical condition - suggested she try melatonin - discussed switching to decaf beverages after lunch  Clarene Reamer, FNP-BC  [facility], [place]  02/06/2017 5:05 PM

## 2017-02-07 DIAGNOSIS — X32XXXA Exposure to sunlight, initial encounter: Secondary | ICD-10-CM | POA: Diagnosis not present

## 2017-02-07 DIAGNOSIS — L570 Actinic keratosis: Secondary | ICD-10-CM | POA: Diagnosis not present

## 2017-02-07 DIAGNOSIS — L821 Other seborrheic keratosis: Secondary | ICD-10-CM | POA: Diagnosis not present

## 2017-02-07 DIAGNOSIS — L728 Other follicular cysts of the skin and subcutaneous tissue: Secondary | ICD-10-CM | POA: Diagnosis not present

## 2017-02-07 DIAGNOSIS — D1801 Hemangioma of skin and subcutaneous tissue: Secondary | ICD-10-CM | POA: Diagnosis not present

## 2017-02-14 ENCOUNTER — Ambulatory Visit (INDEPENDENT_AMBULATORY_CARE_PROVIDER_SITE_OTHER): Payer: Medicare Other | Admitting: *Deleted

## 2017-02-14 DIAGNOSIS — M810 Age-related osteoporosis without current pathological fracture: Secondary | ICD-10-CM

## 2017-02-14 MED ORDER — DENOSUMAB 60 MG/ML ~~LOC~~ SOLN
60.0000 mg | Freq: Once | SUBCUTANEOUS | Status: AC
  Administered 2017-02-14: 60 mg via SUBCUTANEOUS

## 2017-02-15 ENCOUNTER — Other Ambulatory Visit: Payer: Self-pay | Admitting: Anesthesiology

## 2017-02-15 ENCOUNTER — Encounter: Payer: Self-pay | Admitting: Anesthesiology

## 2017-02-15 ENCOUNTER — Ambulatory Visit
Admission: RE | Admit: 2017-02-15 | Discharge: 2017-02-15 | Disposition: A | Discharge status: Home / Self Care | Payer: Medicare Other | Source: Ambulatory Visit | Attending: Anesthesiology | Admitting: Anesthesiology

## 2017-02-15 ENCOUNTER — Other Ambulatory Visit: Payer: Self-pay

## 2017-02-15 ENCOUNTER — Ambulatory Visit (HOSPITAL_BASED_OUTPATIENT_CLINIC_OR_DEPARTMENT_OTHER): Payer: Medicare Other | Admitting: Anesthesiology

## 2017-02-15 VITALS — BP 146/80 | HR 86 | Temp 98.1°F | Resp 15 | Ht 66.5 in | Wt 148.0 lb

## 2017-02-15 DIAGNOSIS — E039 Hypothyroidism, unspecified: Secondary | ICD-10-CM | POA: Diagnosis not present

## 2017-02-15 DIAGNOSIS — E559 Vitamin D deficiency, unspecified: Secondary | ICD-10-CM | POA: Insufficient documentation

## 2017-02-15 DIAGNOSIS — M79604 Pain in right leg: Secondary | ICD-10-CM | POA: Diagnosis present

## 2017-02-15 DIAGNOSIS — R52 Pain, unspecified: Secondary | ICD-10-CM

## 2017-02-15 DIAGNOSIS — J449 Chronic obstructive pulmonary disease, unspecified: Secondary | ICD-10-CM | POA: Insufficient documentation

## 2017-02-15 DIAGNOSIS — Z8673 Personal history of transient ischemic attack (TIA), and cerebral infarction without residual deficits: Secondary | ICD-10-CM | POA: Insufficient documentation

## 2017-02-15 DIAGNOSIS — K2270 Barrett's esophagus without dysplasia: Secondary | ICD-10-CM | POA: Diagnosis not present

## 2017-02-15 DIAGNOSIS — G4733 Obstructive sleep apnea (adult) (pediatric): Secondary | ICD-10-CM | POA: Insufficient documentation

## 2017-02-15 DIAGNOSIS — M810 Age-related osteoporosis without current pathological fracture: Secondary | ICD-10-CM | POA: Insufficient documentation

## 2017-02-15 DIAGNOSIS — I639 Cerebral infarction, unspecified: Secondary | ICD-10-CM

## 2017-02-15 DIAGNOSIS — M5136 Other intervertebral disc degeneration, lumbar region: Secondary | ICD-10-CM

## 2017-02-15 DIAGNOSIS — Z7982 Long term (current) use of aspirin: Secondary | ICD-10-CM | POA: Insufficient documentation

## 2017-02-15 DIAGNOSIS — Z96652 Presence of left artificial knee joint: Secondary | ICD-10-CM | POA: Diagnosis not present

## 2017-02-15 DIAGNOSIS — E7800 Pure hypercholesterolemia, unspecified: Secondary | ICD-10-CM | POA: Diagnosis not present

## 2017-02-15 DIAGNOSIS — Z79899 Other long term (current) drug therapy: Secondary | ICD-10-CM | POA: Diagnosis not present

## 2017-02-15 DIAGNOSIS — M25562 Pain in left knee: Secondary | ICD-10-CM | POA: Insufficient documentation

## 2017-02-15 DIAGNOSIS — M5431 Sciatica, right side: Secondary | ICD-10-CM | POA: Diagnosis not present

## 2017-02-15 MED ORDER — SODIUM CHLORIDE 0.9 % IJ SOLN
INTRAMUSCULAR | Status: AC
  Filled 2017-02-15: qty 10

## 2017-02-15 MED ORDER — TRIAMCINOLONE ACETONIDE 40 MG/ML IJ SUSP
INTRAMUSCULAR | Status: AC
  Filled 2017-02-15: qty 1

## 2017-02-15 MED ORDER — IOPAMIDOL (ISOVUE-M 200) INJECTION 41%
20.0000 mL | Freq: Once | INTRAMUSCULAR | Status: DC | PRN
  Administered 2017-02-15: 10 mL
  Filled 2017-02-15: qty 20

## 2017-02-15 MED ORDER — ROPIVACAINE HCL 2 MG/ML IJ SOLN
INTRAMUSCULAR | Status: AC
  Filled 2017-02-15: qty 10

## 2017-02-15 MED ORDER — IOPAMIDOL (ISOVUE-M 200) INJECTION 41%
INTRAMUSCULAR | Status: AC
  Filled 2017-02-15: qty 10

## 2017-02-15 MED ORDER — LIDOCAINE HCL (PF) 1 % IJ SOLN
5.0000 mL | Freq: Once | INTRAMUSCULAR | Status: AC
  Administered 2017-02-15: 5 mL via SUBCUTANEOUS

## 2017-02-15 MED ORDER — SODIUM CHLORIDE 0.9% FLUSH
10.0000 mL | Freq: Once | INTRAVENOUS | Status: AC
  Administered 2017-02-15: 10 mL

## 2017-02-15 MED ORDER — LIDOCAINE HCL (PF) 1 % IJ SOLN
INTRAMUSCULAR | Status: AC
  Filled 2017-02-15: qty 5

## 2017-02-15 MED ORDER — TRIAMCINOLONE ACETONIDE 40 MG/ML IJ SUSP
40.0000 mg | Freq: Once | INTRAMUSCULAR | Status: AC
  Administered 2017-02-15: 40 mg

## 2017-02-15 MED ORDER — ROPIVACAINE HCL 2 MG/ML IJ SOLN
10.0000 mL | Freq: Once | INTRAMUSCULAR | Status: AC
  Administered 2017-02-15: 10 mL via EPIDURAL

## 2017-02-15 MED ORDER — MIDAZOLAM HCL 5 MG/5ML IJ SOLN
INTRAMUSCULAR | Status: AC
  Filled 2017-02-15: qty 5

## 2017-02-15 NOTE — Progress Notes (Signed)
Subjective:  Patient ID: Dorothy Church, female    DOB: 1935-09-08  Age: 81 y.o. MRN: [id number]  CC: Leg Pain (right); Knee Pain (left s/p joint replacement.  right knee pain as well ); and Back Pain (lower right)     PROCEDURE: L5-S1 epidural steroid under fluoroscopic guidance without sedation  HPI Keiara Sneeringer presents for new patient evaluation.  She is a pleasant 81 year old white female with a long-standing history of low back pain with radiation into the right anterior thigh and anterior shin she describes a pain with a maximum pain score of 6 generally about a 2-4.  It is worse at night and worse with sitting standing walking and climbing.  Alleviating factors include warm showers cold packs or lying down with medication management.  She has a chronic inability to control her bladder but no recent problems with weakness occasionally she has some numbness affecting the anterior shin with pain in that area of the right leg to she has not had a previous MRI of the lumbar region but has had a cervical MRI.  She is sent here today for fast track for an epidural steroid administration.  History Gabby has a past medical history of Aortic valve disorders, Arthritis, Barrett's esophagus, COPD (chronic obstructive pulmonary disease) (HCC), Hypercholesteremia, Hypothyroidism, Impaired fasting glucose, Intrinsic asthma, unspecified, Obstructive sleep apnea (adult) (pediatric), Osteoporosis, Pure hypercholesterolemia, Syncope and collapse, TIA (transient ischemic attack), and Vitamin D deficiency.   She has a past surgical history that includes Replacement unicondylar joint knee; Hip surgery; Colonoscopy; Eye surgery; prosthesis eye implant; Tonsillectomy; Colonoscopy with propofol (N/A, 12/14/2014); Esophagogastroduodenoscopy (egd) with propofol (N/A, 12/14/2014); Breast excisional biopsy (Left); and Esophagogastroduodenoscopy (egd) with propofol (N/A, 10/18/2016).   Her family history includes  Alcohol abuse in her father; Bipolar disorder in her brother; Breast cancer in her mother; Heart disease in her father.She reports that  has never smoked. she has never used smokeless tobacco. She reports that she drinks about 0.6 oz of alcohol per week. She reports that she does not use drugs.  No results found for this or any previous visit.  No results found for: TOXASSSELUR  Outpatient Medications Prior to Visit  Medication Sig Dispense Refill  . acetaminophen (TYLENOL) 325 MG tablet Take 650 mg by mouth every 6 (six) hours as needed.    Marland Kitchen albuterol (VENTOLIN HFA) 108 (90 BASE) MCG/ACT inhaler Inhale 2 puffs into the lungs every 6 (six) hours as needed for wheezing. 18 g 5  . aspirin EC 81 MG EC tablet Take 1 tablet (81 mg total) by mouth daily.    . budesonide-formoterol (SYMBICORT) 160-4.5 MCG/ACT inhaler Inhale 2 puffs into the lungs 2 (two) times daily. 3 Inhaler 3  . dexlansoprazole (DEXILANT) 60 MG capsule Take 60 mg by mouth daily.    . diclofenac sodium (VOLTAREN) 1 % GEL Apply 4 g topically 4 (four) times daily. 5 Tube 11  . levothyroxine (SYNTHROID, LEVOTHROID) 50 MCG tablet TAKE 1 TABLET BY MOUTH  DAILY BEFORE BREAKFAST 90 tablet 3  . Multiple Vitamin (MULTIVITAMIN) tablet Take 1 tablet by mouth daily.    Marland Kitchen PARoxetine (PAXIL) 30 MG tablet Take 1 tablet (30 mg total) by mouth every morning. 90 tablet 3  . simvastatin (ZOCOR) 20 MG tablet Take 1 tablet (20 mg total) by mouth at bedtime. 90 tablet 3  . calcium carbonate (OS-CAL) 600 MG TABS Take 600 mg by mouth daily.     Facility-Administered Medications Prior to Visit  Medication Dose Route Frequency  Provider Last Rate Last Dose  . denosumab (PROLIA) injection 60 mg  60 mg Subcutaneous Q6 months Lucille Passy, MD   60 mg at 12/13/15 1422   Lab Results  Component Value Date   WBC 4.9 12/12/2016   HGB 13.6 12/12/2016   HCT 39.9 12/12/2016   PLT 201 12/12/2016   GLUCOSE 98 12/12/2016   CHOL 144 12/12/2016   TRIG 109  12/12/2016   HDL 47 12/12/2016   LDLCALC 75 12/12/2016   ALT 16 12/11/2016   AST 21 12/11/2016   NA 140 12/12/2016   K 3.6 12/12/2016   CL 107 12/12/2016   CREATININE 0.85 12/12/2016   BUN 12 12/12/2016   CO2 26 12/12/2016   TSH 1.53 10/24/2016   INR 0.99 12/11/2016   HGBA1C 5.3 12/12/2016    --------------------------------------------------------------------------------------------------------------------- Dg C-arm 1-60 Min-no Report  Result Date: 02/15/2017 Fluoroscopy was utilized by the requesting physician.  No radiographic interpretation.       ---------------------------------------------------------------------------------------------------------------------- Past Medical History:  Diagnosis Date  . Aortic valve disorders   . Arthritis   . Barrett's esophagus   . COPD (chronic obstructive pulmonary disease) ([place])   . Hypercholesteremia   . Hypothyroidism   . Impaired fasting glucose   . Intrinsic asthma, unspecified   . Obstructive sleep apnea (adult) (pediatric)   . Osteoporosis   . Pure hypercholesterolemia   . Syncope and collapse   . TIA (transient ischemic attack)   . Vitamin D deficiency     Past Surgical History:  Procedure Laterality Date  . BREAST EXCISIONAL BIOPSY Left   . COLONOSCOPY    . COLONOSCOPY WITH PROPOFOL N/A 12/14/2014   Procedure: COLONOSCOPY WITH PROPOFOL;  Surgeon: Lollie Sails, MD;  Location: [facility] ENDOSCOPY;  Service: Endoscopy;  Laterality: N/A;  . ESOPHAGOGASTRODUODENOSCOPY (EGD) WITH PROPOFOL N/A 12/14/2014   Procedure: ESOPHAGOGASTRODUODENOSCOPY (EGD) WITH PROPOFOL;  Surgeon: Lollie Sails, MD;  Location: [facility] ENDOSCOPY;  Service: Endoscopy;  Laterality: N/A;  . ESOPHAGOGASTRODUODENOSCOPY (EGD) WITH PROPOFOL N/A 10/18/2016   Procedure: ESOPHAGOGASTRODUODENOSCOPY (EGD) WITH PROPOFOL;  Surgeon: Lollie Sails, MD;  Location: [facility] ENDOSCOPY;  Service: Endoscopy;  Laterality: N/A;  . EYE SURGERY    . HIP SURGERY     . prosthesis eye implant    . REPLACEMENT UNICONDYLAR JOINT KNEE    . TONSILLECTOMY      Family History  Problem Relation Age of Onset  . Heart disease Father   . Alcohol abuse Father   . Breast cancer Mother   . Bipolar disorder Brother     Social History   Tobacco Use  . Smoking status: Never Smoker  . Smokeless tobacco: Never Used  Substance Use Topics  . Alcohol use: Yes    Alcohol/week: 0.6 oz    Types: 1 Glasses of wine per week    Comment: occas. wine    ---------------------------------------------------------------------------------------------------------------------  Scheduled Meds: Continuous Infusions: PRN Meds:.iopamidol   BP (!) 146/80   Pulse 86   Temp 98.1 F (36.7 C) (Oral)   Resp 15   Ht 5' 6.5" (1.689 m)   Wt 148 lb (67.1 kg)   SpO2 100%   BMI 23.53 kg/m    BP Readings from Last 3 Encounters:  02/15/17 (!) 146/80  02/06/17 110/70  12/25/16 110/64     Wt Readings from Last 3 Encounters:  02/15/17 148 lb (67.1 kg)  02/06/17 148 lb (67.1 kg)  12/25/16 147 lb 12.8 oz (67 kg)     ----------------------------------------------------------------------------------------------------------------------  ROS Review  of Systems  Cardiac: No angina or palpitations GI: No abdominal pain or constipation Constitutional: No fevers chills nausea or vomiting Pulmonary: No shortness of breath or pain on inspiration Objective:  BP (!) 146/80   Pulse 86   Temp 98.1 F (36.7 C) (Oral)   Resp 15   Ht 5' 6.5" (1.689 m)   Wt 148 lb (67.1 kg)   SpO2 100%   BMI 23.53 kg/m   Physical Exam Patient is alert oriented cooperative compliant Heart is regular rhythm without murmur Lungs are clear to auscultation Inspection of the low back reveals no significant paraspinous muscle tenderness in the lumbar region.  Her muscle tone and bulk is good with no apparent lower extremity weakness.     Assessment & Plan:   Justus was seen today for leg  pain, knee pain and back pain.  Diagnoses and all orders for this visit:  DDD (degenerative disc disease), lumbar  Sciatica of right side  Other orders -     triamcinolone acetonide (KENALOG-40) injection 40 mg -     sodium chloride flush (NS) 0.9 % injection 10 mL -     ropivacaine (PF) 2 mg/mL (0.2%) (NAROPIN) injection 10 mL -     lidocaine (PF) (XYLOCAINE) 1 % injection 5 mL -     iopamidol (ISOVUE-M) 41 % intrathecal injection 20 mL     ----------------------------------------------------------------------------------------------------------------------  Problem List Items Addressed This Visit    None    Visit Diagnoses    DDD (degenerative disc disease), lumbar    -  Primary   Relevant Medications   triamcinolone acetonide (KENALOG-40) injection 40 mg (Completed)   Sciatica of right side          ----------------------------------------------------------------------------------------------------------------------  1. DDD (degenerative disc disease), lumbar Based on her persistent low back pain and evidence of right L4 sciatica we will proceed with a lumbar epidural steroid injection today.  The risks and benefits have been fully reviewed with her in detail and all of her questions answered.  We will have her continue with core stretching strengthening exercises as tolerated and aerobic conditioning with walking.  2. Sciatica of right side As above.  She is to return to clinic in 1 month for a second epidural injection.  We will try to do this at the L4-5 interspace if possible today.    ----------------------------------------------------------------------------------------------------------------------  I am having Nelida Gores maintain her calcium carbonate, multivitamin, albuterol, diclofenac sodium, acetaminophen, dexlansoprazole, budesonide-formoterol, levothyroxine, simvastatin, PARoxetine, and aspirin. We administered triamcinolone acetonide, sodium chloride  flush, ropivacaine (PF) 2 mg/mL (0.2%), lidocaine (PF), and iopamidol. We will continue to administer denosumab.   Meds ordered this encounter  Medications  . triamcinolone acetonide (KENALOG-40) injection 40 mg  . sodium chloride flush (NS) 0.9 % injection 10 mL  . ropivacaine (PF) 2 mg/mL (0.2%) (NAROPIN) injection 10 mL  . lidocaine (PF) (XYLOCAINE) 1 % injection 5 mL  . iopamidol (ISOVUE-M) 41 % intrathecal injection 20 mL       Follow-up: Return for evaluation, procedure.   Procedure: L5-S1 LESI with fluoroscopic guidance and moderate sedation  NOTE: The risks, benefits, and expectations of the procedure have been discussed and explained to the patient who was understanding and in agreement with suggested treatment plan. No guarantees were made.  DESCRIPTION OF PROCEDURE: Lumbar epidural steroid injection with no IV Versed, EKG, blood pressure, pulse, and pulse oximetry monitoring. The procedure was performed with the patient in the prone position under fluoroscopic guidance.  Sterile prep x3  was initiated and I then injected subcutaneous lidocaine to the overlying L4-5 site after its fluoroscopic identifictation.  Is unable to achieve loss-of-resistance at L4-5 and then repeated with lidocaine at L5-S1.  1 Single Pass was utilized.  Using strict aseptic technique, I then advanced an 18-gauge Tuohy epidural needle in the midline using interlaminar approach via loss-of-resistance to saline technique. There was negative aspiration for heme or  CSF.  I then confirmed position with both AP and Lateral fluoroscan.  2 cc cc of Isovue were injected and a  total of 5 mL of Preservative-Free normal saline mixed with 40 mg of Kenalog and 1cc Ropicaine 0.2 percent were injected incrementally via the  epidurally placed needle. The needle was removed. The patient tolerated the injection well and was convalesced and discharged to home in stable condition. Should the patient have any post procedure  difficulty they have been instructed on how to contact us for assistance.    Molli Barrows, MD 12:23 PM  The [place] practitioner database for opioid medications on this patient has been reviewed by me and my staff   Greater than 50% of the total encounter time was spent in counseling and / or coordination of care.     This dictation was performed utilizing Systems analyst.  Please excuse any unintentional or mistaken typographical errors as a result.

## 2017-02-15 NOTE — Patient Instructions (Signed)
Pain Management Discharge Instructions  General Discharge Instructions :  If you need to reach your doctor call: Monday-Friday 8:00 am - 4:00 pm at [phone] or toll free [phone].  After clinic hours [phone] to have operator reach doctor.  Bring all of your medication bottles to all your appointments in the pain clinic.  To cancel or reschedule your appointment with Pain Management please remember to call 24 hours in advance to avoid a fee.  Refer to the educational materials which you have been given on: General Risks, I had my Procedure. Discharge Instructions, Post Sedation.  Post Procedure Instructions:  The drugs you were given will stay in your system until tomorrow, so for the next 24 hours you should not drive, make any legal decisions or drink any alcoholic beverages.  You may eat anything you prefer, but it is better to start with liquids then soups and crackers, and gradually work up to solid foods.  Please notify your doctor immediately if you have any unusual bleeding, trouble breathing or pain that is not related to your normal pain.  Depending on the type of procedure that was done, some parts of your body may feel week and/or numb.  This usually clears up by tonight or the next day.  Walk with the use of an assistive device or accompanied by an adult for the 24 hours.  You may use ice on the affected area for the first 24 hours.  Put ice in a Ziploc bag and cover with a towel and place against area 15 minutes on 15 minutes off.  You may switch to heat after 24 hours.Epidural Steroid Injection An epidural steroid injection is a shot of steroid medicine and numbing medicine that is given into the space between the spinal cord and the bones in your back (epidural space). The shot helps relieve pain caused by an irritated or swollen nerve root. The amount of pain relief you get from the injection depends on what is causing the nerve to be swollen and irritated,  and how long your pain lasts. You are more likely to benefit from this injection if your pain is strong and comes on suddenly rather than if you have had pain for a long time. Tell a health care provider about:  Any allergies you have.  All medicines you are taking, including vitamins, herbs, eye drops, creams, and over-the-counter medicines.  Any problems you or family members have had with anesthetic medicines.  Any blood disorders you have.  Any surgeries you have had.  Any medical conditions you have.  Whether you are pregnant or may be pregnant. What are the risks? Generally, this is a safe procedure. However, problems may occur, including:  Headache.  Bleeding.  Infection.  Allergic reaction to medicines.  Damage to your nerves.  What happens before the procedure? Staying hydrated Follow instructions from your health care provider about hydration, which may include:  Up to 2 hours before the procedure - you may continue to drink clear liquids, such as water, clear fruit juice, black coffee, and plain tea.  Eating and drinking restrictions Follow instructions from your health care provider about eating and drinking, which may include:  8 hours before the procedure - stop eating heavy meals or foods such as meat, fried foods, or fatty foods.  6 hours before the procedure - stop eating light meals or foods, such as toast or cereal.  6 hours before the procedure - stop drinking milk or drinks that contain milk.    2 hours before the procedure - stop drinking clear liquids.  Medicine  You may be given medicines to lower anxiety.  Ask your health care provider about: ? Changing or stopping your regular medicines. This is especially important if you are taking diabetes medicines or blood thinners. ? Taking medicines such as aspirin and ibuprofen. These medicines can thin your blood. Do not take these medicines before your procedure if your health care provider  instructs you not to. General instructions  Plan to have someone take you home from the hospital or clinic. What happens during the procedure?  You may receive a medicine to help you relax (sedative).  You will be asked to lie on your abdomen.  The injection site will be cleaned.  A numbing medicine (local anesthetic) will be used to numb the injection site.  A needle will be inserted through your skin into the epidural space. You may feel some discomfort when this happens. An X-ray machine will be used to make sure the needle is put as close as possible to the affected nerve.  A steroid medicine and a local anesthetic will be injected into the epidural space.  The needle will be removed.  A bandage (dressing) will be put over the injection site. What happens after the procedure?  Your blood pressure, heart rate, breathing rate, and blood oxygen level will be monitored until the medicines you were given have worn off.  Your arm or leg may feel weak or numb for a few hours.  The injection site may feel sore.  Do not drive for 24 hours if you received a sedative. This information is not intended to replace advice given to you by your health care provider. Make sure you discuss any questions you have with your health care provider. Document Released: 05/15/2007 Document Revised: 07/20/2015 Document Reviewed: 05/24/2015 Elsevier Interactive Patient Education  2018 [employer]  

## 2017-02-15 NOTE — Progress Notes (Signed)
Safety precautions to be maintained throughout the outpatient stay will include: orient to surroundings, keep bed in low position, maintain call bell within reach at all times, provide assistance with transfer out of bed and ambulation.  

## 2017-02-25 DIAGNOSIS — H40001 Preglaucoma, unspecified, right eye: Secondary | ICD-10-CM | POA: Diagnosis not present

## 2017-02-28 ENCOUNTER — Ambulatory Visit: Payer: Self-pay

## 2017-02-28 DIAGNOSIS — S93601A Unspecified sprain of right foot, initial encounter: Secondary | ICD-10-CM | POA: Diagnosis not present

## 2017-02-28 NOTE — Telephone Encounter (Signed)
   Reason for Disposition . [1] SEVERE pain (e.g., excruciating, unable to do any normal activities) AND [2] not improved after 2 hours of pain medicine  Answer Assessment - Initial Assessment Questions 1. ONSET: "When did the pain start?"      This morning 2. LOCATION: "Where is the pain located?"      Across the top of foot 3. PAIN: "How bad is the pain?"    (Scale 1-10; or mild, moderate, severe)   -  MILD (1-3): doesn't interfere with normal activities    -  MODERATE (4-7): interferes with normal activities (e.g., work or school) or awakens from sleep, limping    -  SEVERE (8-10): excruciating pain, unable to do any normal activities, unable to walk     Mild 4. WORK OR EXERCISE: "Has there been any recent work or exercise that involved this part of the body?"      No 5. CAUSE: "What do you think is causing the foot pain?"     Fell at home 6. OTHER SYMPTOMS: "Do you have any other symptoms?" (e.g., leg pain, rash, fever, numbness)     No 7. PREGNANCY: "Is there any chance you are pregnant?" "When was your last menstrual period?"     No  Protocols used: FOOT PAIN-A-AH  No availability in the office. Instructed to go to urgent care per Flow coordinator.

## 2017-03-04 DIAGNOSIS — H40001 Preglaucoma, unspecified, right eye: Secondary | ICD-10-CM | POA: Diagnosis not present

## 2017-03-09 DIAGNOSIS — G4733 Obstructive sleep apnea (adult) (pediatric): Secondary | ICD-10-CM | POA: Diagnosis not present

## 2017-03-11 DIAGNOSIS — F329 Major depressive disorder, single episode, unspecified: Secondary | ICD-10-CM | POA: Insufficient documentation

## 2017-03-11 DIAGNOSIS — K2270 Barrett's esophagus without dysplasia: Secondary | ICD-10-CM | POA: Insufficient documentation

## 2017-03-11 DIAGNOSIS — E7800 Pure hypercholesterolemia, unspecified: Secondary | ICD-10-CM

## 2017-03-11 DIAGNOSIS — F339 Major depressive disorder, recurrent, unspecified: Secondary | ICD-10-CM | POA: Insufficient documentation

## 2017-03-11 DIAGNOSIS — F39 Unspecified mood [affective] disorder: Secondary | ICD-10-CM | POA: Insufficient documentation

## 2017-03-11 HISTORY — DX: Pure hypercholesterolemia, unspecified: E78.00

## 2017-03-11 HISTORY — DX: Barrett's esophagus without dysplasia: K22.70

## 2017-03-12 ENCOUNTER — Encounter: Payer: Self-pay | Admitting: Podiatry

## 2017-03-12 ENCOUNTER — Other Ambulatory Visit: Payer: Self-pay | Admitting: Podiatry

## 2017-03-12 ENCOUNTER — Ambulatory Visit (INDEPENDENT_AMBULATORY_CARE_PROVIDER_SITE_OTHER): Payer: Medicare HMO | Admitting: Podiatry

## 2017-03-12 ENCOUNTER — Ambulatory Visit (INDEPENDENT_AMBULATORY_CARE_PROVIDER_SITE_OTHER): Payer: Medicare HMO

## 2017-03-12 DIAGNOSIS — S92301A Fracture of unspecified metatarsal bone(s), right foot, initial encounter for closed fracture: Secondary | ICD-10-CM | POA: Diagnosis not present

## 2017-03-12 DIAGNOSIS — S99921A Unspecified injury of right foot, initial encounter: Secondary | ICD-10-CM | POA: Diagnosis not present

## 2017-03-12 DIAGNOSIS — M79671 Pain in right foot: Secondary | ICD-10-CM

## 2017-03-12 DIAGNOSIS — S92314A Nondisplaced fracture of first metatarsal bone, right foot, initial encounter for closed fracture: Secondary | ICD-10-CM

## 2017-03-12 NOTE — Progress Notes (Signed)
   Subjective:    Patient ID: Dorothy Church, female    DOB: 1935/11/19, 82 y.o.   MRN: [id number]  HPI  Chief Complaint  Patient presents with  . Foot Injury    Right Dorsal of foot and arch x 1.5 weeks. Pt stated "I tripped over a rug and twisted my foot"  . Ankle Weakness    b/L        Review of Systems  Respiratory: Positive for shortness of breath.   Gastrointestinal:       Acid reflux  Musculoskeletal: Positive for arthralgias.  All other systems reviewed and are negative.      Objective:   Physical Exam        Assessment & Plan:

## 2017-03-14 MED ORDER — MELOXICAM 15 MG PO TABS: 15.0000 mg | ORAL_TABLET | Freq: Every day | ORAL | 3 refills | Status: DC

## 2017-03-16 NOTE — Progress Notes (Signed)
   HPI: 82 year old presenting today as a new patient with a chief complaint of pain to the right dorsal foot and arch that began 1.5 weeks ago secondary to tripping over a rug and twisting the foot. She reports associated bruising and swelling of the foot. She reports bilateral ankle weakness as well. Walking increases the pain. There are no alleviating factors noted. Patient is here for further evaluation and treatment.   Past Medical History:  Diagnosis Date  . Aortic valve disorders   . Arthritis   . Barrett's esophagus   . COPD (chronic obstructive pulmonary disease) ([place])   . Hypercholesteremia   . Hypothyroidism   . Impaired fasting glucose   . Intrinsic asthma, unspecified   . Obstructive sleep apnea (adult) (pediatric)   . Osteoporosis   . Pure hypercholesterolemia   . Syncope and collapse   . TIA (transient ischemic attack)   . Vitamin D deficiency      Physical Exam: General: The patient is alert and oriented x3 in no acute distress.  Dermatology: Skin is warm, dry and supple bilateral lower extremities. Negative for open lesions or macerations.  Vascular: Palpable pedal pulses bilaterally. No edema or erythema noted. Capillary refill within normal limits.  Neurological: Epicritic and protective threshold grossly intact bilaterally.   Musculoskeletal Exam: Pain with palpation to the right midfoot with moderate edema and ecchymosis. Range of motion within normal limits to all pedal and ankle joints bilateral. Muscle strength 5/5 in all groups bilateral.   Radiographic Exam:  Normal osseous mineralization. Joint spaces preserved. Nondisplaced, plantar 1st metatarsal base avulsion fracture of the right foot.    Assessment: - Right 1st metatarsal base avulsion fracture - plantar, nondisplaced   Plan of Care:  - Patient evaluated. X-Rays reviewed.  - Weightbearing in short CAM boot with cane. - Compression anklet dispensed. - Prescription for Meloxicam provided to  patient. - Return to clinic in 4 weeks.    Edrick Kins, DPM [facility]  Dr. Edrick Kins, DPM    [address], [place] [address]                Office [phone]  Fax [phone]

## 2017-03-21 ENCOUNTER — Ambulatory Visit (HOSPITAL_BASED_OUTPATIENT_CLINIC_OR_DEPARTMENT_OTHER): Payer: Medicare HMO | Admitting: Anesthesiology

## 2017-03-21 ENCOUNTER — Ambulatory Visit
Admission: RE | Admit: 2017-03-21 | Discharge: 2017-03-21 | Disposition: A | Discharge status: Home / Self Care | Payer: Medicare HMO | Source: Ambulatory Visit | Attending: Anesthesiology | Admitting: Anesthesiology

## 2017-03-21 ENCOUNTER — Encounter: Payer: Self-pay | Admitting: Anesthesiology

## 2017-03-21 ENCOUNTER — Other Ambulatory Visit: Payer: Self-pay | Admitting: Anesthesiology

## 2017-03-21 ENCOUNTER — Other Ambulatory Visit: Payer: Self-pay

## 2017-03-21 VITALS — BP 133/70 | HR 84 | Temp 98.7°F | Resp 18 | Ht 66.5 in | Wt 148.0 lb

## 2017-03-21 DIAGNOSIS — R52 Pain, unspecified: Secondary | ICD-10-CM

## 2017-03-21 DIAGNOSIS — Z7951 Long term (current) use of inhaled steroids: Secondary | ICD-10-CM | POA: Insufficient documentation

## 2017-03-21 DIAGNOSIS — M5431 Sciatica, right side: Secondary | ICD-10-CM | POA: Diagnosis not present

## 2017-03-21 DIAGNOSIS — M5136 Other intervertebral disc degeneration, lumbar region: Secondary | ICD-10-CM | POA: Insufficient documentation

## 2017-03-21 DIAGNOSIS — Z79890 Hormone replacement therapy (postmenopausal): Secondary | ICD-10-CM | POA: Diagnosis not present

## 2017-03-21 DIAGNOSIS — M79604 Pain in right leg: Secondary | ICD-10-CM | POA: Diagnosis present

## 2017-03-21 DIAGNOSIS — Z7982 Long term (current) use of aspirin: Secondary | ICD-10-CM | POA: Diagnosis not present

## 2017-03-21 DIAGNOSIS — Z79899 Other long term (current) drug therapy: Secondary | ICD-10-CM | POA: Diagnosis not present

## 2017-03-21 MED ORDER — TRIAMCINOLONE ACETONIDE 40 MG/ML IJ SUSP
INTRAMUSCULAR | Status: AC
  Filled 2017-03-21: qty 1

## 2017-03-21 MED ORDER — ROPIVACAINE HCL 2 MG/ML IJ SOLN
10.0000 mL | Freq: Once | INTRAMUSCULAR | Status: AC
  Administered 2017-03-21: 10 mL via EPIDURAL

## 2017-03-21 MED ORDER — SODIUM CHLORIDE 0.9 % IJ SOLN
INTRAMUSCULAR | Status: AC
  Filled 2017-03-21: qty 10

## 2017-03-21 MED ORDER — LIDOCAINE HCL (PF) 1 % IJ SOLN
5.0000 mL | Freq: Once | INTRAMUSCULAR | Status: AC
  Administered 2017-03-21: 5 mL via SUBCUTANEOUS

## 2017-03-21 MED ORDER — IOPAMIDOL (ISOVUE-M 200) INJECTION 41%
INTRAMUSCULAR | Status: AC
  Filled 2017-03-21: qty 10

## 2017-03-21 MED ORDER — SODIUM CHLORIDE 0.9% FLUSH
10.0000 mL | Freq: Once | INTRAVENOUS | Status: AC
  Administered 2017-03-21: 10 mL

## 2017-03-21 MED ORDER — IOPAMIDOL (ISOVUE-M 200) INJECTION 41%
20.0000 mL | Freq: Once | INTRAMUSCULAR | Status: DC | PRN
  Administered 2017-03-21: 10 mL
  Filled 2017-03-21: qty 20

## 2017-03-21 MED ORDER — ROPIVACAINE HCL 2 MG/ML IJ SOLN
INTRAMUSCULAR | Status: AC
  Filled 2017-03-21: qty 10

## 2017-03-21 MED ORDER — TRIAMCINOLONE ACETONIDE 40 MG/ML IJ SUSP
40.0000 mg | Freq: Once | INTRAMUSCULAR | Status: AC
  Administered 2017-03-21: 40 mg

## 2017-03-21 NOTE — Progress Notes (Signed)
Subjective:  Patient ID: Dorothy Church, female    DOB: [birth year]  Age: 82 y.o. MRN: [id number]  CC: Leg Pain (right LE anterior)   Procedure: L5-S1 epidural steroid under fluoroscopic guidance without sedation  HPI Dorothy Church presents for return evaluation.  She is last seen a month ago and had her first epidural at that time for her right lower leg pain and low back pain and right knee pain.  She states that her right lower leg pain has improved by about 80%.  Her low back pain although less intense on initial evaluation also has improved somewhat.  No new changes in lower extremity strength and function her bowel bladder function are noted at this time.  She did injure her ankle over the course of the last few weeks and this has been a problem.  She is wearing an ankle boot.  Otherwise no other changes are reported at this time.  Outpatient Medications Prior to Visit  Medication Sig Dispense Refill  . acetaminophen (TYLENOL) 325 MG tablet Take 650 mg by mouth every 6 (six) hours as needed.    Marland Kitchen albuterol (VENTOLIN HFA) 108 (90 BASE) MCG/ACT inhaler Inhale 2 puffs into the lungs every 6 (six) hours as needed for wheezing. 18 g 5  . aspirin EC 81 MG EC tablet Take 1 tablet (81 mg total) by mouth daily.    . budesonide-formoterol (SYMBICORT) 160-4.5 MCG/ACT inhaler Inhale 2 puffs into the lungs 2 (two) times daily. 3 Inhaler 3  . calcium carbonate (OS-CAL) 600 MG TABS Take 600 mg by mouth daily.    Marland Kitchen dexlansoprazole (DEXILANT) 60 MG capsule Take 60 mg by mouth daily.    . diclofenac sodium (VOLTAREN) 1 % GEL Apply 4 g topically 4 (four) times daily. 5 Tube 11  . Esomeprazole Magnesium (NEXIUM PO) Take by mouth.    . levothyroxine (SYNTHROID, LEVOTHROID) 50 MCG tablet TAKE 1 TABLET BY MOUTH  DAILY BEFORE BREAKFAST 90 tablet 3  . meloxicam (MOBIC) 15 MG tablet Take 1 tablet (15 mg total) by mouth daily. 30 tablet 3  . Multiple Vitamin (MULTIVITAMIN) tablet Take 1 tablet by mouth daily.     Marland Kitchen PARoxetine (PAXIL) 30 MG tablet Take 1 tablet (30 mg total) by mouth every morning. 90 tablet 3  . simvastatin (ZOCOR) 20 MG tablet Take 1 tablet (20 mg total) by mouth at bedtime. 90 tablet 3   Facility-Administered Medications Prior to Visit  Medication Dose Route Frequency Provider Last Rate Last Dose  . denosumab (PROLIA) injection 60 mg  60 mg Subcutaneous Q6 months Lucille Passy, MD   60 mg at 12/13/15 1422    Review of Systems CNS: No confusion or sedation Cardiac: No angina or palpitations GI: No abdominal pain or constipation Constitutional: No nausea vomiting fevers or chills  Objective:  BP 132/73   Pulse 85   Temp 98.7 F (37.1 C) (Oral)   Resp 17   Ht 5' 6.5" (1.689 m)   Wt 148 lb (67.1 kg)   SpO2 99%   BMI 23.53 kg/m    BP Readings from Last 3 Encounters:  03/21/17 132/73  02/15/17 (!) 146/80  02/06/17 110/70     Wt Readings from Last 3 Encounters:  03/21/17 148 lb (67.1 kg)  02/15/17 148 lb (67.1 kg)  02/06/17 148 lb (67.1 kg)     Physical Exam Pt is alert and oriented PERRL EOMI HEART IS RRR no murmur or rub LCTA no wheezing or rales MUSCULOSKELETAL reveals  baseline muscle strength and muscle tone and bulk.  Labs  Lab Results  Component Value Date   HGBA1C 5.3 12/12/2016   Lab Results  Component Value Date   LDLCALC 75 12/12/2016   CREATININE 0.85 12/12/2016    -------------------------------------------------------------------------------------------------------------------- Lab Results  Component Value Date   WBC 4.9 12/12/2016   HGB 13.6 12/12/2016   HCT 39.9 12/12/2016   PLT 201 12/12/2016   GLUCOSE 98 12/12/2016   CHOL 144 12/12/2016   TRIG 109 12/12/2016   HDL 47 12/12/2016   LDLCALC 75 12/12/2016   ALT 16 12/11/2016   AST 21 12/11/2016   NA 140 12/12/2016   K 3.6 12/12/2016   CL 107 12/12/2016   CREATININE 0.85 12/12/2016   BUN 12 12/12/2016   CO2 26 12/12/2016   TSH 1.53 10/24/2016   INR 0.99 12/11/2016    HGBA1C 5.3 12/12/2016    --------------------------------------------------------------------------------------------------------------------- No results found.   Assessment & Plan:   Shomari was seen today for leg pain.  Diagnoses and all orders for this visit:  DDD (degenerative disc disease), lumbar -     triamcinolone acetonide (KENALOG-40) injection 40 mg -     sodium chloride flush (NS) 0.9 % injection 10 mL -     ropivacaine (PF) 2 mg/mL (0.2%) (NAROPIN) injection 10 mL -     lidocaine (PF) (XYLOCAINE) 1 % injection 5 mL -     iopamidol (ISOVUE-M) 41 % intrathecal injection 20 mL  Sciatica of right side -     triamcinolone acetonide (KENALOG-40) injection 40 mg -     sodium chloride flush (NS) 0.9 % injection 10 mL -     ropivacaine (PF) 2 mg/mL (0.2%) (NAROPIN) injection 10 mL -     lidocaine (PF) (XYLOCAINE) 1 % injection 5 mL -     iopamidol (ISOVUE-M) 41 % intrathecal injection 20 mL        ----------------------------------------------------------------------------------------------------------------------  Problem List Items Addressed This Visit      Unprioritized   DDD (degenerative disc disease), lumbar - Primary   Relevant Medications   triamcinolone acetonide (KENALOG-40) injection 40 mg (Completed)   sodium chloride flush (NS) 0.9 % injection 10 mL (Completed)   ropivacaine (PF) 2 mg/mL (0.2%) (NAROPIN) injection 10 mL (Completed)   lidocaine (PF) (XYLOCAINE) 1 % injection 5 mL (Completed)   iopamidol (ISOVUE-M) 41 % intrathecal injection 20 mL    Other Visit Diagnoses    Sciatica of right side       Relevant Medications   triamcinolone acetonide (KENALOG-40) injection 40 mg (Completed)   sodium chloride flush (NS) 0.9 % injection 10 mL (Completed)   ropivacaine (PF) 2 mg/mL (0.2%) (NAROPIN) injection 10 mL (Completed)   lidocaine (PF) (XYLOCAINE) 1 % injection 5 mL (Completed)   iopamidol (ISOVUE-M) 41 % intrathecal injection 20 mL         ----------------------------------------------------------------------------------------------------------------------  1. DDD (degenerative disc disease), lumbar We will proceed with a repeat epidural injection today.  We have gone over the risks and benefits with the procedure with the patient in full detail and all questions answered.  She is to return to clinic in 2 months for reevaluation possible repeat injection at that time.  At this point I do not feel like an MRI is indicated secondary to her response from therapy. - triamcinolone acetonide (KENALOG-40) injection 40 mg - sodium chloride flush (NS) 0.9 % injection 10 mL - ropivacaine (PF) 2 mg/mL (0.2%) (NAROPIN) injection 10 mL - lidocaine (PF) (XYLOCAINE) 1 %  injection 5 mL - iopamidol (ISOVUE-M) 41 % intrathecal injection 20 mL  2. Sciatica of right side As above - triamcinolone acetonide (KENALOG-40) injection 40 mg - sodium chloride flush (NS) 0.9 % injection 10 mL - ropivacaine (PF) 2 mg/mL (0.2%) (NAROPIN) injection 10 mL - lidocaine (PF) (XYLOCAINE) 1 % injection 5 mL - iopamidol (ISOVUE-M) 41 % intrathecal injection 20 mL    ----------------------------------------------------------------------------------------------------------------------  I am having Dorothy Church maintain her calcium carbonate, multivitamin, albuterol, diclofenac sodium, acetaminophen, dexlansoprazole, budesonide-formoterol, levothyroxine, simvastatin, PARoxetine, aspirin, Esomeprazole Magnesium (NEXIUM PO), and meloxicam. We administered triamcinolone acetonide, sodium chloride flush, ropivacaine (PF) 2 mg/mL (0.2%), lidocaine (PF), and iopamidol. We will continue to administer denosumab.   Meds ordered this encounter  Medications  . triamcinolone acetonide (KENALOG-40) injection 40 mg  . sodium chloride flush (NS) 0.9 % injection 10 mL  . ropivacaine (PF) 2 mg/mL (0.2%) (NAROPIN) injection 10 mL  . lidocaine (PF) (XYLOCAINE) 1 %  injection 5 mL  . iopamidol (ISOVUE-M) 41 % intrathecal injection 20 mL   Patient's Medications  New Prescriptions   No medications on file  Previous Medications   ACETAMINOPHEN (TYLENOL) 325 MG TABLET    Take 650 mg by mouth every 6 (six) hours as needed.   ALBUTEROL (VENTOLIN HFA) 108 (90 BASE) MCG/ACT INHALER    Inhale 2 puffs into the lungs every 6 (six) hours as needed for wheezing.   ASPIRIN EC 81 MG EC TABLET    Take 1 tablet (81 mg total) by mouth daily.   BUDESONIDE-FORMOTEROL (SYMBICORT) 160-4.5 MCG/ACT INHALER    Inhale 2 puffs into the lungs 2 (two) times daily.   CALCIUM CARBONATE (OS-CAL) 600 MG TABS    Take 600 mg by mouth daily.   DEXLANSOPRAZOLE (DEXILANT) 60 MG CAPSULE    Take 60 mg by mouth daily.   DICLOFENAC SODIUM (VOLTAREN) 1 % GEL    Apply 4 g topically 4 (four) times daily.   ESOMEPRAZOLE MAGNESIUM (NEXIUM PO)    Take by mouth.   LEVOTHYROXINE (SYNTHROID, LEVOTHROID) 50 MCG TABLET    TAKE 1 TABLET BY MOUTH  DAILY BEFORE BREAKFAST   MELOXICAM (MOBIC) 15 MG TABLET    Take 1 tablet (15 mg total) by mouth daily.   MULTIPLE VITAMIN (MULTIVITAMIN) TABLET    Take 1 tablet by mouth daily.   PAROXETINE (PAXIL) 30 MG TABLET    Take 1 tablet (30 mg total) by mouth every morning.   SIMVASTATIN (ZOCOR) 20 MG TABLET    Take 1 tablet (20 mg total) by mouth at bedtime.  Modified Medications   No medications on file  Discontinued Medications   No medications on file   ----------------------------------------------------------------------------------------------------------------------  Follow-up: Return for evaluation, procedure.  Procedure: L5-S1 epidural #2 under fluoroscopic guidance without sedation   Procedure: L5-S1 LESI with fluoroscopic guidance and moderate sedation  NOTE: The risks, benefits, and expectations of the procedure have been discussed and explained to the patient who was understanding and in agreement with suggested treatment plan. No guarantees were  made.  DESCRIPTION OF PROCEDURE: Lumbar epidural steroid injection with no IV Versed, EKG, blood pressure, pulse, and pulse oximetry monitoring. The procedure was performed with the patient in the prone position under fluoroscopic guidance.  Sterile prep x3 was initiated and I then injected subcutaneous lidocaine to the overlying L5-S1 site after its fluoroscopic identifictation.  Using strict aseptic technique, I then advanced an 18-gauge Tuohy epidural needle in the midline using interlaminar approach via loss-of-resistance to saline technique. There  was negative aspiration for heme or  CSF.  I then confirmed position with both AP and Lateral fluoroscan.  2 cc of Isovue were injected and a  total of 5 mL of Preservative-Free normal saline mixed with 40 mg of Kenalog and 1cc Ropicaine 0.2 percent were injected incrementally via the  epidurally placed needle. The needle was removed. The patient tolerated the injection well and was convalesced and discharged to home in stable condition. Should the patient have any post procedure difficulty they have been instructed on how to contact us for assistance.    Molli Barrows, MD

## 2017-03-21 NOTE — Progress Notes (Signed)
Safety precautions to be maintained throughout the outpatient stay will include: orient to surroundings, keep bed in low position, maintain call bell within reach at all times, provide assistance with transfer out of bed and ambulation.  

## 2017-03-22 ENCOUNTER — Telehealth: Payer: Self-pay

## 2017-03-22 NOTE — Telephone Encounter (Signed)
Post procedure phone call.  Left message.  

## 2017-03-25 ENCOUNTER — Telehealth: Payer: Self-pay

## 2017-03-25 NOTE — Telephone Encounter (Signed)
Patient has been approved for [facility] with insurance, but there is no openings left in February. Will you ask if I can add her on to a day this month? She really needs to get in before March.

## 2017-04-04 ENCOUNTER — Ambulatory Visit (INDEPENDENT_AMBULATORY_CARE_PROVIDER_SITE_OTHER): Payer: Medicare HMO | Admitting: Family Medicine

## 2017-04-04 ENCOUNTER — Encounter: Payer: Self-pay | Admitting: Family Medicine

## 2017-04-04 ENCOUNTER — Ambulatory Visit: Payer: Self-pay

## 2017-04-04 DIAGNOSIS — R1032 Left lower quadrant pain: Secondary | ICD-10-CM

## 2017-04-04 LAB — HEMOCCULT GUIAC POC 1CARD (OFFICE): Fecal Occult Blood, POC: NEGATIVE

## 2017-04-04 NOTE — Telephone Encounter (Signed)
Noted appointment today

## 2017-04-04 NOTE — Telephone Encounter (Signed)
Patient called in with c/o "abdominal pain and constipation." She says "I haven't had a BM all weekend and didn't feel good, so I thought it was constipation. Monday I took Miralax and ate prunes. Tuesday I took 3 Senokot tablets, then had 2 BM's but just passed brown water, no stool was present. My abdomen has been hurting about a 3, but when I press on it it's a 7. It's painful to the left side around my waist around to my side." I asked is she normally constipated, she said "no and I feel like my diet hasn't changed and my medications haven't changed. I take a stool softener every day with my medications." She reports she went to the nurse in the apartment complex where she lives and the nurse said her temperature was 99 and advised her to be seen by her provider because she may have a blockage. According to protocol, see PCP within 24 hours, she says "I want to be seen today." No availability with her provider or any providers in the practice. I offered her to go to another [place] office, she agreed. Appointment made today at 1500 with Almira Coaster, [place] at [facility], care advice given, patient verbalized understanding.  Reason for Disposition . Last bowel movement (BM) > 4 days ago  Answer Assessment - Initial Assessment Questions 1. STOOL PATTERN OR FREQUENCY: "How often do you pass bowel movements (BMs)?"  (Normal range: tid to q 3 days)  "When was the last BM passed?"       Normal every other day; no BM since before Saturday 2. STRAINING: "Do you have to strain to have a BM?"      Not normally 3. RECTAL PAIN: "Does your rectum hurt when the stool comes out?" If so, ask: "Do you have hemorrhoids? How bad is the pain?"  (Scale 1-10; or mild, moderate, severe)     No 4. STOOL COMPOSITION: "Are the stools hard?"      Not usually 5. BLOOD ON STOOLS: "Has there been any blood on the toilet tissue or on the surface of the BM?" If so, ask: "When was the last time?"      No 6.  CHRONIC CONSTIPATION: "Is this a new problem for you?"  If no, ask: How long have you had this problem?" (days, weeks, months)      No-new problem 7. CHANGES IN DIET: "Have there been any recent changes in your diet?"      Not really, probably not eating like I normally do 8. MEDICATIONS: "Have you been taking any new medications?"     No changes 9. LAXATIVES: "Have you been using any laxatives or enemas?"  If yes, ask "What, how often, and when was the last time?"     No, use stool softener every day 10. CAUSE: "What do you think is causing the constipation?"        No 11. OTHER SYMPTOMS: "Do you have any other symptoms?" (e.g., abdominal pain, fever, vomiting)       Abdominal pain left side at waist move around to side, nauseated for several days, low grade temp 99 today 12. PREGNANCY: "Is there any chance you are pregnant?" "When was your last menstrual period?"       N/A  Protocols used: CONSTIPATION-A-AH

## 2017-04-04 NOTE — Patient Instructions (Addendum)
Please go to [facility] for your scan. Also, please go to the lab before you leave for your lab work.  Results of the scan will be called to you. Please seek medical attention immediately if symptoms do not improve with treatment, worsen, or you develop a fever, nausea or vomiting.   Abdominal Pain, Adult Many things can cause belly (abdominal) pain. Most times, belly pain is not dangerous. Many cases of belly pain can be watched and treated at home. Sometimes belly pain is serious, though. Your doctor will try to find the cause of your belly pain. Follow these instructions at home:  Take over-the-counter and prescription medicines only as told by your doctor. Do not take medicines that help you poop (laxatives) unless told to by your doctor.  Drink enough fluid to keep your pee (urine) clear or pale yellow.  Watch your belly pain for any changes.  Keep all follow-up visits as told by your doctor. This is important. Contact a doctor if:  Your belly pain changes or gets worse.  You are not hungry, or you lose weight without trying.  You are having trouble pooping (constipated) or have watery poop (diarrhea) for more than 2-3 days.  You have pain when you pee or poop.  Your belly pain wakes you up at night.  Your pain gets worse with meals, after eating, or with certain foods.  You are throwing up and cannot keep anything down.  You have a fever. Get help right away if:  Your pain does not go away as soon as your doctor says it should.  You cannot stop throwing up.  Your pain is only in areas of your belly, such as the right side or the left lower part of the belly.  You have bloody or black poop, or poop that looks like tar.  You have very bad pain, cramping, or bloating in your belly.  You have signs of not having enough fluid or water in your body (dehydration), such as: ? Dark pee, very little pee, or no pee. ? Cracked lips. ? Dry mouth. ? Sunken  eyes. ? Sleepiness. ? Weakness. This information is not intended to replace advice given to you by your health care provider. Make sure you discuss any questions you have with your health care provider. Document Released: 07/25/2007 Document Revised: 08/26/2015 Document Reviewed: 07/20/2015 Elsevier Interactive Patient Education  2018 [employer].

## 2017-04-04 NOTE — Progress Notes (Signed)
Subjective:    Patient ID: Dorothy Church, female    DOB: 1936-02-09, 82 y.o.   MRN: [id number]  HPI  Dorothy Church is a 82 year old female who presents today with abdominal pain that has been present for 6 days.  She reports associated constipation with last BM 6 days ago. Treatment with Senakot and prunes have provided limited benefit. She reports that after Senakot she experienced 2 episodes of liquid or "brown water" but no formed stool.   Abdominal Pain: Location: Left sided abdominal pain Onset/Timing: 6 days ago. Pain is constant Duration: 6 days ago and constant Severity/Quality: Rated as a 7 and noted as cramping Worse/Better by: Lying down with a pillow provides limited benefit. Associated Symptoms: nausea and fatigue  Loss of appetite: Yes Vomiting: No Diarrhea: No Rectal pain or blood: No Fever: 99 F at home Weight loss: No  She denies history of constipation and does take stool softeners.  Colonoscopy 12/09/2014 noted multiple diverticula and adenomatous polyps. Also indicated torturous colon.  Review of Systems  Constitutional: Positive for appetite change and fatigue. Negative for chills and fever.       Low grade fever  Respiratory: Negative for cough, shortness of breath and wheezing.   Cardiovascular: Negative for chest pain and palpitations.  Gastrointestinal: Positive for abdominal pain, constipation and nausea. Negative for blood in stool and vomiting.  Genitourinary: Negative for dysuria, frequency, hematuria and urgency.  Skin: Negative for rash.  Neurological: Negative for dizziness, weakness and headaches.   Past Medical History:  Diagnosis Date  . Aortic valve disorders   . Arthritis   . Barrett's esophagus   . COPD (chronic obstructive pulmonary disease) ([place])   . Hypercholesteremia   . Hypothyroidism   . Impaired fasting glucose   . Intrinsic asthma, unspecified   . Obstructive sleep apnea (adult) (pediatric)   . Osteoporosis   . Pure  hypercholesterolemia   . Syncope and collapse   . TIA (transient ischemic attack)   . Vitamin D deficiency      Social History   Socioeconomic History  . Marital status: Divorced    Spouse name: Not on file  . Number of children: Not on file  . Years of education: Not on file  . Highest education level: Not on file  Social Needs  . Financial resource strain: Not on file  . Food insecurity - worry: Not on file  . Food insecurity - inability: Not on file  . Transportation needs - medical: Not on file  . Transportation needs - non-medical: Not on file  Occupational History  . Not on file  Tobacco Use  . Smoking status: Never Smoker  . Smokeless tobacco: Never Used  Substance and Sexual Activity  . Alcohol use: Yes    Alcohol/week: 0.6 oz    Types: 1 Glasses of wine per week    Comment: occas. wine  . Drug use: No  . Sexual activity: No  Other Topics Concern  . Not on file  Social History Narrative   Lived in [place], moved to [place] two years ago.   Divorced.      Has a living will.   Would desire CPR, would not desire extraordinary measures.          Past Surgical History:  Procedure Laterality Date  . BREAST EXCISIONAL BIOPSY Left   . COLONOSCOPY    . COLONOSCOPY WITH PROPOFOL N/A 12/14/2014   Procedure: COLONOSCOPY WITH PROPOFOL;  Surgeon: Lollie Sails,  MD;  Location: [facility] ENDOSCOPY;  Service: Endoscopy;  Laterality: N/A;  . ESOPHAGOGASTRODUODENOSCOPY (EGD) WITH PROPOFOL N/A 12/14/2014   Procedure: ESOPHAGOGASTRODUODENOSCOPY (EGD) WITH PROPOFOL;  Surgeon: Lollie Sails, MD;  Location: [facility] ENDOSCOPY;  Service: Endoscopy;  Laterality: N/A;  . ESOPHAGOGASTRODUODENOSCOPY (EGD) WITH PROPOFOL N/A 10/18/2016   Procedure: ESOPHAGOGASTRODUODENOSCOPY (EGD) WITH PROPOFOL;  Surgeon: Lollie Sails, MD;  Location: [facility] ENDOSCOPY;  Service: Endoscopy;  Laterality: N/A;  . EYE SURGERY    . HIP SURGERY    . prosthesis eye implant    . REPLACEMENT  UNICONDYLAR JOINT KNEE    . TONSILLECTOMY      Family History  Problem Relation Age of Onset  . Heart disease Father   . Alcohol abuse Father   . Breast cancer Mother   . Bipolar disorder Brother     Allergies  Allergen Reactions  . Amoxicillin Other (See Comments)    Pencillins  . Cefzil [Cefprozil]   . Cephalexin   . Propoxyphene     Other reaction(s): Unknown  . Relafen [Nabumetone]   . Talwin [Pentazocine]     Current Outpatient Medications on File Prior to Visit  Medication Sig Dispense Refill  . acetaminophen (TYLENOL) 325 MG tablet Take 650 mg by mouth every 6 (six) hours as needed.    Marland Kitchen albuterol (VENTOLIN HFA) 108 (90 BASE) MCG/ACT inhaler Inhale 2 puffs into the lungs every 6 (six) hours as needed for wheezing. 18 g 5  . aspirin EC 81 MG EC tablet Take 1 tablet (81 mg total) by mouth daily.    . budesonide-formoterol (SYMBICORT) 160-4.5 MCG/ACT inhaler Inhale 2 puffs into the lungs 2 (two) times daily. 3 Inhaler 3  . calcium carbonate (OS-CAL) 600 MG TABS Take 600 mg by mouth daily.    Marland Kitchen dexlansoprazole (DEXILANT) 60 MG capsule Take 60 mg by mouth daily.    . diclofenac sodium (VOLTAREN) 1 % GEL Apply 4 g topically 4 (four) times daily. 5 Tube 11  . Esomeprazole Magnesium (NEXIUM PO) Take by mouth.    . levothyroxine (SYNTHROID, LEVOTHROID) 50 MCG tablet TAKE 1 TABLET BY MOUTH  DAILY BEFORE BREAKFAST 90 tablet 3  . meloxicam (MOBIC) 15 MG tablet Take 1 tablet (15 mg total) by mouth daily. 30 tablet 3  . Multiple Vitamin (MULTIVITAMIN) tablet Take 1 tablet by mouth daily.    Marland Kitchen PARoxetine (PAXIL) 30 MG tablet Take 1 tablet (30 mg total) by mouth every morning. 90 tablet 3  . senna (SENOKOT) 8.6 MG tablet Take 1 tablet by mouth as needed for constipation.    . simvastatin (ZOCOR) 20 MG tablet Take 1 tablet (20 mg total) by mouth at bedtime. 90 tablet 3   Current Facility-Administered Medications on File Prior to Visit  Medication Dose Route Frequency Provider Last  Rate Last Dose  . denosumab (PROLIA) injection 60 mg  60 mg Subcutaneous Q6 months Deborra Medina, Talia M, MD   60 mg at 12/13/15 1422    BP 114/66 (BP Location: Left Arm, Patient Position: Sitting, Cuff Size: Normal)   Pulse (!) 110   Temp 99 F (37.2 C) (Oral)   Resp 18   SpO2 99%       Objective:   Physical Exam  Constitutional: She is oriented to person, place, and time. She appears well-developed and well-nourished.  Eyes: Pupils are equal, round, and reactive to light. No scleral icterus.  Neck: Neck supple.  Cardiovascular: Normal rate and regular rhythm.  Pulmonary/Chest: Effort normal and breath sounds normal.  She has no wheezes. She has no rales.  Abdominal: Soft. Bowel sounds are normal. There is tenderness in the left lower quadrant. There is no rebound, no CVA tenderness, no tenderness at McBurney's point and negative Murphy's sign.  No external abnormalities, normal sphincter tone. No rectal masses or tenderness noted.  Musculoskeletal: She exhibits no edema.  Lymphadenopathy:    She has no cervical adenopathy.  Neurological: She is alert and oriented to person, place, and time. Coordination normal.  Skin: Skin is warm and dry. No rash noted.  Psychiatric: She has a normal mood and affect. Her behavior is normal. Judgment and thought content normal.      Assessment & Plan:  1. Left lower quadrant pain Abdominal pain in LLQ with history of diverticula and tortuous colon are concerning for diverticulitis. No stool found in rectal vault with exam, negative occult blood on stool card. Will obtain lab work and CT of abdomen to determine source of abdominal pain. VSS with retake of pulse noted as 98.  Further treatment and recommendations will be determined following imaging.   - CT Abdomen Pelvis Wo Contrast; Future - CBC with Differential/Platelet - Basic metabolic panel  Delano Metz, FNP-C

## 2017-04-05 ENCOUNTER — Telehealth: Payer: Self-pay | Admitting: *Deleted

## 2017-04-05 ENCOUNTER — Ambulatory Visit
Admission: RE | Admit: 2017-04-05 | Discharge: 2017-04-05 | Disposition: A | Discharge status: Home / Self Care | Payer: Medicare HMO | Source: Ambulatory Visit | Attending: Family Medicine | Admitting: Family Medicine

## 2017-04-05 DIAGNOSIS — K8020 Calculus of gallbladder without cholecystitis without obstruction: Secondary | ICD-10-CM | POA: Diagnosis not present

## 2017-04-05 DIAGNOSIS — K5732 Diverticulitis of large intestine without perforation or abscess without bleeding: Secondary | ICD-10-CM | POA: Insufficient documentation

## 2017-04-05 DIAGNOSIS — R1032 Left lower quadrant pain: Secondary | ICD-10-CM

## 2017-04-05 DIAGNOSIS — I700 Atherosclerosis of aorta: Secondary | ICD-10-CM | POA: Insufficient documentation

## 2017-04-05 LAB — BASIC METABOLIC PANEL
BUN: 14 mg/dL (ref 6–23)
CO2: 29 meq/L (ref 19–32)
CREATININE: 0.76 mg/dL (ref 0.40–1.20)
Calcium: 9.1 mg/dL (ref 8.4–10.5)
Chloride: 102 mEq/L (ref 96–112)
GFR: 77.96 mL/min (ref >60.00)
GLUCOSE: 83 mg/dL (ref 70–99)
Potassium: 4.6 mEq/L (ref 3.5–5.1)
SODIUM: 137 meq/L (ref 135–145)

## 2017-04-05 LAB — CBC WITH DIFFERENTIAL/PLATELET
BASOS PCT: 0.8 % (ref 0.0–3.0)
Basophils Absolute: 0.1 10*3/uL (ref 0.0–0.1)
EOS PCT: 2.9 % (ref 0.0–5.0)
Eosinophils Absolute: 0.3 10*3/uL (ref 0.0–0.7)
HEMATOCRIT: 39.3 % (ref 36.0–46.0)
Hemoglobin: 13.5 g/dL (ref 12.0–15.0)
Lymphocytes Relative: 8.9 % — ABNORMAL LOW (ref 12.0–46.0)
Lymphs Abs: 0.9 10*3/uL (ref 0.7–4.0)
MCHC: 34.4 g/dL (ref 30.0–36.0)
MCV: 91.7 fl (ref 78.0–100.0)
MONO ABS: 1.1 10*3/uL — AB (ref 0.1–1.0)
MONOS PCT: 10.2 % (ref 3.0–12.0)
Neutro Abs: 8 10*3/uL — ABNORMAL HIGH (ref 1.4–7.7)
Neutrophils Relative %: 77.2 % — ABNORMAL HIGH (ref 43.0–77.0)
Platelets: 252 10*3/uL (ref 150.0–400.0)
RBC: 4.28 Mil/uL (ref 3.87–5.11)
RDW: 12.8 % (ref 11.5–15.5)
WBC: 10.4 10*3/uL (ref 4.0–10.5)

## 2017-04-05 MED ORDER — METRONIDAZOLE 500 MG PO TABS: 500.0000 mg | ORAL_TABLET | Freq: Three times a day (TID) | ORAL | 0 refills | Status: DC

## 2017-04-05 MED ORDER — CIPROFLOXACIN HCL 500 MG PO TABS: 500.0000 mg | ORAL_TABLET | Freq: Two times a day (BID) | ORAL | 0 refills | Status: DC

## 2017-04-05 NOTE — Telephone Encounter (Signed)
Per result note, the ordering provider has contacted the patient regarding results.

## 2017-04-05 NOTE — Telephone Encounter (Signed)
Wendelyn Breslow called to let you know that the results are in Epic for the CT.

## 2017-04-05 NOTE — Telephone Encounter (Signed)
Contacted patient at 0800 this morning to determine reason that CT was not completed yesterday at 1700 as planned. She noted that upon arrival at the hospital she was provided with the prep and realized that her wait time was longer than she wanted since she was driven to there by a friend. She decided to have CT today. Subsequently, I contacted the referral coordinator who moved up the timing of the CT and contacted patient. Review of lab results indicates an elevated neutrophil count with WBC at high normal. With increasing neutrophil count and exam/history that are concerning for diverticulitis, cipro 500 mg every 12 hours x 7 days and metronidazole 500 mg every 8 hours x 7 days has been sent to the pharmacy.  Contacted patient regarding lab results and antibiotic therapy that will be empirically started. Further advised avoidance of ETOH while taking metronidazole. Advised fluids and results of CT scan will determine further treatment.

## 2017-04-08 ENCOUNTER — Ambulatory Visit (INDEPENDENT_AMBULATORY_CARE_PROVIDER_SITE_OTHER): Payer: Medicare HMO | Admitting: Family Medicine

## 2017-04-08 ENCOUNTER — Encounter: Payer: Self-pay | Admitting: Family Medicine

## 2017-04-08 VITALS — BP 110/62 | HR 79 | Temp 97.9°F | Resp 18 | Wt 145.1 lb

## 2017-04-08 DIAGNOSIS — K219 Gastro-esophageal reflux disease without esophagitis: Secondary | ICD-10-CM | POA: Diagnosis not present

## 2017-04-08 DIAGNOSIS — K5792 Diverticulitis of intestine, part unspecified, without perforation or abscess without bleeding: Secondary | ICD-10-CM

## 2017-04-08 NOTE — Progress Notes (Signed)
Subjective:    Patient ID: Dorothy Church, female    DOB: 07-28-1935, 82 y.o.   MRN: [id number]  HPI This is a 82 yo female who presents today for follow up of recent abdominal pain. Was seen 04/04/17 with left lower quadrant pain. Had CT abd/pelvis 04/05/17 which showed descending colon diverticulitis without abscess. Multiple calcified stones in gallbladder without biliary dilation (chronic).  Feeling ok except antibiotic making her feel bad (sluggish). Was constipated at beginning, not now, loose bowel movements on cipro/flagyl. No recent fever.  Continues to have chronic heart burn. Sees Dr. Gustavo Lah, GI at [facility], had EDG 8/18 that showed gastritis, tortuous esophagus, small hiatal hernia, esophogeal mucosa changes of short segment Barretts. Takes Dexilant daily and has frequent breakthrough symptoms which are worse if she bends over. Takes Tums prn.   Past Medical History:  Diagnosis Date  . Aortic valve disorders   . Arthritis   . Barrett's esophagus   . COPD (chronic obstructive pulmonary disease) ([place])   . Hypercholesteremia   . Hypothyroidism   . Impaired fasting glucose   . Intrinsic asthma, unspecified   . Obstructive sleep apnea (adult) (pediatric)   . Osteoporosis   . Pure hypercholesterolemia   . Syncope and collapse   . TIA (transient ischemic attack)   . Vitamin D deficiency    Past Surgical History:  Procedure Laterality Date  . BREAST EXCISIONAL BIOPSY Left   . COLONOSCOPY    . COLONOSCOPY WITH PROPOFOL N/A 12/14/2014   Procedure: COLONOSCOPY WITH PROPOFOL;  Surgeon: Lollie Sails, MD;  Location: [facility] ENDOSCOPY;  Service: Endoscopy;  Laterality: N/A;  . ESOPHAGOGASTRODUODENOSCOPY (EGD) WITH PROPOFOL N/A 12/14/2014   Procedure: ESOPHAGOGASTRODUODENOSCOPY (EGD) WITH PROPOFOL;  Surgeon: Lollie Sails, MD;  Location[facility] ENDOSCOPY;  Service: Endoscopy;  Laterality: N/A;  . ESOPHAGOGASTRODUODENOSCOPY (EGD) WITH PROPOFOL N/A 10/18/2016   Procedure:  ESOPHAGOGASTRODUODENOSCOPY (EGD) WITH PROPOFOL;  Surgeon: Lollie Sails, MD;  Location: [facility] ENDOSCOPY;  Service: Endoscopy;  Laterality: N/A;  . EYE SURGERY    . HIP SURGERY    . prosthesis eye implant    . REPLACEMENT UNICONDYLAR JOINT KNEE    . TONSILLECTOMY     Family History  Problem Relation Age of Onset  . Heart disease Father   . Alcohol abuse Father   . Breast cancer Mother   . Bipolar disorder Brother    Social History   Tobacco Use  . Smoking status: Never Smoker  . Smokeless tobacco: Never Used  Substance Use Topics  . Alcohol use: Yes    Alcohol/week: 0.6 oz    Types: 1 Glasses of wine per week    Comment: occas. wine  . Drug use: No      Review of Systems Per HPI    Objective:   Physical Exam  Constitutional: She is oriented to person, place, and time. She appears well-developed and well-nourished. No distress.  Eyes: Conjunctivae are normal.  Neck: Normal range of motion. Neck supple.  Cardiovascular: Normal rate, regular rhythm and normal heart sounds.  Pulmonary/Chest: Effort normal and breath sounds normal.  Abdominal: Soft. She exhibits no distension. There is tenderness (mild left lower quadrant). There is no rebound and no guarding.  Neurological: She is alert and oriented to person, place, and time.  Skin: Skin is warm and dry. She is not diaphoretic.  Psychiatric: She has a normal mood and affect. Her behavior is normal. Judgment and thought content normal.  Vitals reviewed.     BP 110/62 (  BP Location: Left Arm, Patient Position: Sitting, Cuff Size: Normal)   Pulse 79   Temp 97.9 F (36.6 C) (Oral)   Resp 18   Wt 145 lb 1.6 oz (65.8 kg)   SpO2 97%   BMI 23.07 kg/m  Wt Readings from Last 3 Encounters:  04/08/17 145 lb 1.6 oz (65.8 kg)  03/21/17 148 lb (67.1 kg)  02/15/17 148 lb (67.1 kg)       Assessment & Plan:  1. Diverticulitis - symptoms improved, she was instructed to finish cipro and flagyl and continue to avoid  alcohol and dairy until finished - discussed s/s and she is to RTC if symptoms return  2. Gastroesophageal reflux disease without esophagitis - she will follow up with GI, discussed additional OTC medications she can add for symptomatic relief   Clarene Reamer, FNP-BC  [facility] at [facility], [place]  04/08/2017 2:45 PM

## 2017-04-08 NOTE — Patient Instructions (Signed)
For your acid reflux- can try over the counter Mylicon or Mylanta Consider seeing your gastroenterologist regarding your sympotms   Hiatal Hernia A hiatal hernia occurs when part of the stomach slides above the muscle that separates the abdomen from the chest (diaphragm). A person can be born with a hiatal hernia (congenital), or it may develop over time. In almost all cases of hiatal hernia, only the top part of the stomach pushes through the diaphragm. Many people have a hiatal hernia with no symptoms. The larger the hernia, the more likely it is that you will have symptoms. In some cases, a hiatal hernia allows stomach acid to flow back into the tube that carries food from your mouth to your stomach (esophagus). This may cause heartburn symptoms. Severe heartburn symptoms may mean that you have developed a condition called gastroesophageal reflux disease (GERD). What are the causes? This condition is caused by a weakness in the opening (hiatus) where the esophagus passes through the diaphragm to attach to the upper part of the stomach. A person may be born with a weakness in the hiatus, or a weakness can develop over time. What increases the risk? This condition is more likely to develop in:  Older people. Age is a major risk factor for a hiatal hernia, especially if you are over the age of 54.  Pregnant women.  People who are overweight.  People who have frequent constipation.  What are the signs or symptoms? Symptoms of this condition usually develop in the form of GERD symptoms. Symptoms include:  Heartburn.  Belching.  Indigestion.  Trouble swallowing.  Coughing or wheezing.  Sore throat.  Hoarseness.  Chest pain.  Nausea and vomiting.  How is this diagnosed? This condition may be diagnosed during testing for GERD. Tests that may be done include:  X-rays of your stomach or chest.  An upper gastrointestinal (GI) series. This is an X-ray exam of your GI tract that  is taken after you swallow a chalky liquid that shows up clearly on the X-ray.  Endoscopy. This is a procedure to look into your stomach using a thin, flexible tube that has a tiny camera and light on the end of it.  How is this treated? This condition may be treated by:  Dietary and lifestyle changes to help reduce GERD symptoms.  Medicines. These may include: ? Over-the-counter antacids. ? Medicines that make your stomach empty more quickly. ? Medicines that block the production of stomach acid (H2 blockers). ? Stronger medicines to reduce stomach acid (proton pump inhibitors).  Surgery to repair the hernia, if other treatments are not helping.  If you have no symptoms, you may not need treatment. Follow these instructions at home: Lifestyle and activity  Do not use any products that contain nicotine or tobacco, such as cigarettes and e-cigarettes. If you need help quitting, ask your health care provider.  Try to achieve and maintain a healthy body weight.  Avoid putting pressure on your abdomen. Anything that puts pressure on your abdomen increases the amount of acid that may be pushed up into your esophagus. ? Avoid bending over, especially after eating. ? Raise the head of your bed by putting blocks under the legs. This keeps your head and esophagus higher than your stomach. ? Do not wear tight clothing around your chest or stomach. ? Try not to strain when having a bowel movement, when urinating, or when lifting heavy objects. Eating and drinking  Avoid foods that can worsen GERD symptoms. These  may include: ? Fatty foods, like fried foods. ? Citrus fruits, like oranges or lemon. ? Other foods and drinks that contain acid, like orange juice or tomatoes. ? Spicy food. ? Chocolate.  Eat frequent small meals instead of three large meals a day. This helps prevent your stomach from getting too full. ? Eat slowly. ? Do not lie down right after eating. ? Do not eat 1-2 hours  before bed.  Do not drink beverages with caffeine. These include cola, coffee, cocoa, and tea.  Do not drink alcohol. General instructions  Take over-the-counter and prescription medicines only as told by your health care provider.  Keep all follow-up visits as told by your health care provider. This is important. Contact a health care provider if:  Your symptoms are not controlled with medicines or lifestyle changes.  You are having trouble swallowing.  You have coughing or wheezing that will not go away. Get help right away if:  Your pain is getting worse.  Your pain spreads to your arms, neck, jaw, teeth, or back.  You have shortness of breath.  You sweat for no reason.  You feel sick to your stomach (nauseous) or you vomit.  You vomit blood.  You have bright red blood in your stools.  You have black, tarry stools. This information is not intended to replace advice given to you by your health care provider. Make sure you discuss any questions you have with your health care provider. Document Released: 04/28/2003 Document Revised: 01/30/2016 Document Reviewed: 01/30/2016 Elsevier Interactive Patient Education  [employer].

## 2017-04-09 ENCOUNTER — Ambulatory Visit (INDEPENDENT_AMBULATORY_CARE_PROVIDER_SITE_OTHER): Payer: Medicare HMO

## 2017-04-09 ENCOUNTER — Ambulatory Visit (INDEPENDENT_AMBULATORY_CARE_PROVIDER_SITE_OTHER): Payer: Medicare HMO | Admitting: Podiatry

## 2017-04-09 ENCOUNTER — Encounter: Payer: Self-pay | Admitting: Podiatry

## 2017-04-09 DIAGNOSIS — S92314A Nondisplaced fracture of first metatarsal bone, right foot, initial encounter for closed fracture: Secondary | ICD-10-CM | POA: Diagnosis not present

## 2017-04-10 ENCOUNTER — Encounter: Payer: Self-pay | Admitting: Family Medicine

## 2017-04-11 ENCOUNTER — Encounter: Payer: Self-pay | Admitting: Family Medicine

## 2017-04-11 NOTE — Progress Notes (Signed)
   HPI: 82 year old female presenting today for follow up evaluation of a fracture to the first metatarsal base of the right foot. She states the foot is doing well and she has not needed to take Mobic. She has been wearing the CAM boot as directed. She denies any pain at this time. Patient is here for further evaluation and treatment.   Past Medical History:  Diagnosis Date  . Aortic valve disorders   . Arthritis   . Barrett's esophagus   . COPD (chronic obstructive pulmonary disease) ([place])   . Hypercholesteremia   . Hypothyroidism   . Impaired fasting glucose   . Intrinsic asthma, unspecified   . Obstructive sleep apnea (adult) (pediatric)   . Osteoporosis   . Pure hypercholesterolemia   . Syncope and collapse   . TIA (transient ischemic attack)   . Vitamin D deficiency      Physical Exam: General: The patient is alert and oriented x3 in no acute distress.  Dermatology: Skin is warm, dry and supple bilateral lower extremities. Negative for open lesions or macerations.  Vascular: Palpable pedal pulses bilaterally. No edema or erythema noted. Capillary refill within normal limits.  Neurological: Epicritic and protective threshold grossly intact bilaterally.   Musculoskeletal Exam: Range of motion within normal limits to all pedal and ankle joints bilateral. Muscle strength 5/5 in all groups bilateral.   Radiographic Exam:  Normal osseous mineralization. Joint spaces preserved. No fracture/dislocation/boney destruction.    Assessment: - Right 1st metatarsal base avulsion fracture - plantar, nondisplaced - resolved   Plan of Care:  - Patient evaluated. X-Rays reviewed.  - Discontinue wearing CAM boot. Resume wearing normal shoes.  - May return to full activity with no restrictions.  - Return to clinic as needed.    Edrick Kins, DPM [facility]  Dr. Edrick Kins, DPM    [address], [place] [address]                 Office [phone]  Fax [phone]

## 2017-04-12 NOTE — Telephone Encounter (Signed)
Called and spoke to patient. She feels like she is turning the corner. No diarrhea or vomiting since yesterday. Still feels a little weak and dizzy. Has been urinating regularly, keeping fluids down. Ate a piece of toast. She was encouraged to continue good fluid intake, follow up on Monday if needed. She is at [facility] and will access staff if she worsens over the weekend.

## 2017-04-14 ENCOUNTER — Encounter: Payer: Self-pay | Admitting: Family Medicine

## 2017-04-15 ENCOUNTER — Telehealth: Payer: Self-pay | Admitting: Family Medicine

## 2017-04-15 ENCOUNTER — Encounter: Payer: Self-pay | Admitting: Family Medicine

## 2017-04-15 ENCOUNTER — Ambulatory Visit (INDEPENDENT_AMBULATORY_CARE_PROVIDER_SITE_OTHER): Payer: Medicare HMO | Admitting: Family Medicine

## 2017-04-15 VITALS — BP 122/62 | HR 78 | Temp 98.1°F | Wt 146.0 lb

## 2017-04-15 DIAGNOSIS — K5792 Diverticulitis of intestine, part unspecified, without perforation or abscess without bleeding: Secondary | ICD-10-CM | POA: Diagnosis not present

## 2017-04-15 NOTE — Telephone Encounter (Signed)
Spoke to pt; scheduled for 1600 2/25

## 2017-04-15 NOTE — Telephone Encounter (Signed)
Please call patient and see if she wants to come in today for follow up- can be 15 minute

## 2017-04-15 NOTE — Progress Notes (Signed)
Subjective:    Patient ID: Dorothy Church, female    DOB: 19-Jun-1935, 82 y.o.   MRN: [id number]  HPI This is a 82 yo female who presents today for follow up of left lower quadrant pain. She finished cipro/flagyl for diverticulitis two days ago. Has had loose BMs. No fever. Was feeling better, but had some cream of spinach soup made by her sister and then had increased cramping and loose bowel movements. Feels fatigued. Has been out driving and running errands today.  Currently on Nexium but having heart burn symptoms.   Past Medical History:  Diagnosis Date  . Aortic valve disorders   . Arthritis   . Barrett's esophagus   . COPD (chronic obstructive pulmonary disease) ([place])   . Hypercholesteremia   . Hypothyroidism   . Impaired fasting glucose   . Intrinsic asthma, unspecified   . Obstructive sleep apnea (adult) (pediatric)   . Osteoporosis   . Pure hypercholesterolemia   . Syncope and collapse   . TIA (transient ischemic attack)   . Vitamin D deficiency    Past Surgical History:  Procedure Laterality Date  . BREAST EXCISIONAL BIOPSY Left   . COLONOSCOPY    . COLONOSCOPY WITH PROPOFOL N/A 12/14/2014   Procedure: COLONOSCOPY WITH PROPOFOL;  Surgeon: Lollie Sails, MD;  Location: [facility] ENDOSCOPY;  Service: Endoscopy;  Laterality: N/A;  . ESOPHAGOGASTRODUODENOSCOPY (EGD) WITH PROPOFOL N/A 12/14/2014   Procedure: ESOPHAGOGASTRODUODENOSCOPY (EGD) WITH PROPOFOL;  Surgeon: Lollie Sails, MD;  Location: [facility] ENDOSCOPY;  Service: Endoscopy;  Laterality: N/A;  . ESOPHAGOGASTRODUODENOSCOPY (EGD) WITH PROPOFOL N/A 10/18/2016   Procedure: ESOPHAGOGASTRODUODENOSCOPY (EGD) WITH PROPOFOL;  Surgeon: Lollie Sails, MD;  Location: [facility] ENDOSCOPY;  Service: Endoscopy;  Laterality: N/A;  . EYE SURGERY    . HIP SURGERY    . prosthesis eye implant    . REPLACEMENT UNICONDYLAR JOINT KNEE    . TONSILLECTOMY     Family History  Problem Relation Age of Onset  . Heart disease Father   .  Alcohol abuse Father   . Breast cancer Mother   . Bipolar disorder Brother    Social History   Tobacco Use  . Smoking status: Never Smoker  . Smokeless tobacco: Never Used  Substance Use Topics  . Alcohol use: Yes    Alcohol/week: 0.6 oz    Types: 1 Glasses of wine per week    Comment: occas. wine  . Drug use: No      Review of Systems Per HPi    Objective:   Physical Exam Physical Exam  Constitutional: Oriented to person, place, and time. She appears well-developed and well-nourished.  HENT:  Head: Normocephalic and atraumatic.  Eyes: Conjunctivae are normal.  Neck: Normal range of motion. Neck supple.  Cardiovascular: Normal rate, regular rhythm and normal heart sounds.   Pulmonary/Chest: Effort normal and breath sounds normal.  Musculoskeletal: Normal range of motion. No edema. Neurological: Alert and oriented to person, place, and time.  Skin: Skin is warm and dry.  Psychiatric: Normal mood and affect. Behavior is normal. Judgment and thought content normal.  Vitals reviewed.   BP 122/62   Pulse 78   Temp 98.1 F (36.7 C) (Oral)   Wt 146 lb (66.2 kg)   SpO2 96%   BMI 23.21 kg/m  Wt Readings from Last 3 Encounters:  04/15/17 146 lb (66.2 kg)  04/08/17 145 lb 1.6 oz (65.8 kg)  03/21/17 148 lb (67.1 kg)       Assessment & Plan:  1. Diverticulitis - resolving, has finished antibiotics - suggested she do a month of probiotic - Gaviscon or Mylanta for break through GERD symtpoms - bland diet until improved - follow up as scheduled in 2 months  Clarene Reamer, FNP-BC  [facility]  04/15/2017 5:03 PM

## 2017-04-15 NOTE — Patient Instructions (Addendum)
Try a probiotic for about a month  For reflux can try Gaviscon or Mylanta

## 2017-04-16 DIAGNOSIS — G4733 Obstructive sleep apnea (adult) (pediatric): Secondary | ICD-10-CM | POA: Diagnosis not present

## 2017-04-18 DIAGNOSIS — R69 Illness, unspecified: Secondary | ICD-10-CM | POA: Diagnosis not present

## 2017-05-01 ENCOUNTER — Encounter: Payer: Self-pay | Admitting: Anesthesiology

## 2017-05-01 ENCOUNTER — Other Ambulatory Visit: Payer: Self-pay

## 2017-05-01 ENCOUNTER — Ambulatory Visit (HOSPITAL_BASED_OUTPATIENT_CLINIC_OR_DEPARTMENT_OTHER): Payer: Medicare HMO | Admitting: Anesthesiology

## 2017-05-01 ENCOUNTER — Ambulatory Visit
Admission: RE | Admit: 2017-05-01 | Discharge: 2017-05-01 | Disposition: A | Discharge status: Home / Self Care | Payer: Medicare HMO | Source: Ambulatory Visit | Attending: Anesthesiology | Admitting: Anesthesiology

## 2017-05-01 ENCOUNTER — Other Ambulatory Visit: Payer: Self-pay | Admitting: Anesthesiology

## 2017-05-01 VITALS — BP 116/75 | HR 78 | Temp 98.1°F | Resp 14 | Ht 66.5 in | Wt 145.0 lb

## 2017-05-01 DIAGNOSIS — M48062 Spinal stenosis, lumbar region with neurogenic claudication: Secondary | ICD-10-CM

## 2017-05-01 DIAGNOSIS — M5136 Other intervertebral disc degeneration, lumbar region: Secondary | ICD-10-CM

## 2017-05-01 DIAGNOSIS — R52 Pain, unspecified: Secondary | ICD-10-CM

## 2017-05-01 DIAGNOSIS — M5431 Sciatica, right side: Secondary | ICD-10-CM

## 2017-05-01 MED ORDER — IOPAMIDOL (ISOVUE-M 200) INJECTION 41%
20.0000 mL | Freq: Once | INTRAMUSCULAR | Status: DC | PRN
  Administered 2017-05-01: 10 mL
  Filled 2017-05-01: qty 20

## 2017-05-01 MED ORDER — LIDOCAINE HCL (PF) 1 % IJ SOLN
5.0000 mL | Freq: Once | INTRAMUSCULAR | Status: AC
  Administered 2017-05-01: 5 mL via SUBCUTANEOUS
  Filled 2017-05-01: qty 5

## 2017-05-01 MED ORDER — ROPIVACAINE HCL 2 MG/ML IJ SOLN
10.0000 mL | Freq: Once | INTRAMUSCULAR | Status: AC
  Administered 2017-05-01: 10 mL via EPIDURAL
  Filled 2017-05-01: qty 10

## 2017-05-01 MED ORDER — DEXAMETHASONE SODIUM PHOSPHATE 10 MG/ML IJ SOLN
INTRAMUSCULAR | Status: AC
Start: 2017-05-01 — End: ?
  Filled 2017-05-01: qty 1

## 2017-05-01 MED ORDER — IOPAMIDOL (ISOVUE-M 200) INJECTION 41%
INTRAMUSCULAR | Status: AC
  Filled 2017-05-01: qty 10

## 2017-05-01 MED ORDER — SODIUM CHLORIDE 0.9% FLUSH
10.0000 mL | Freq: Once | INTRAVENOUS | Status: AC
  Administered 2017-05-01: 10 mL

## 2017-05-01 MED ORDER — TRIAMCINOLONE ACETONIDE 40 MG/ML IJ SUSP
40.0000 mg | Freq: Once | INTRAMUSCULAR | Status: AC
  Administered 2017-05-01: 40 mg
  Filled 2017-05-01: qty 1

## 2017-05-01 NOTE — Progress Notes (Signed)
Safety precautions to be maintained throughout the outpatient stay will include: orient to surroundings, keep bed in low position, maintain call bell within reach at all times, provide assistance with transfer out of bed and ambulation.  

## 2017-05-02 ENCOUNTER — Telehealth: Payer: Self-pay | Admitting: *Deleted

## 2017-05-02 NOTE — Telephone Encounter (Signed)
LVM

## 2017-05-16 ENCOUNTER — Other Ambulatory Visit: Payer: Self-pay

## 2017-05-16 ENCOUNTER — Telehealth: Payer: Self-pay | Admitting: Family Medicine

## 2017-05-16 MED ORDER — LEVOTHYROXINE SODIUM 50 MCG PO TABS: ORAL_TABLET | ORAL | 1 refills | Status: DC

## 2017-05-16 NOTE — Telephone Encounter (Signed)
Copied from [place] [id number]. Topic: Quick Communication - Rx Refill/Question >> May 16, 2017 12:12 PM Shira Bobst, [place] E, NT wrote: Medication: levothyroxine (SYNTHROID, LEVOTHROID) 50 MCG tablet and simvastatin (ZOCOR) 20 MG tablet Has the patient contacted their pharmacy? Yes  (Agent: If no, request that the patient contact the pharmacy for the refill.) Preferred Pharmacy (with phone number or street name): [place], [place] [phone] (Phone) [phone] (Fax)     Agent: Please be advised that RX refills may take up to 3 business days. We ask that you follow-up with your pharmacy.

## 2017-05-17 ENCOUNTER — Encounter: Payer: Self-pay | Admitting: Family Medicine

## 2017-05-17 ENCOUNTER — Ambulatory Visit (INDEPENDENT_AMBULATORY_CARE_PROVIDER_SITE_OTHER): Payer: Medicare HMO | Admitting: Family Medicine

## 2017-05-17 VITALS — BP 112/62 | HR 82 | Temp 98.1°F | Wt 143.2 lb

## 2017-05-17 DIAGNOSIS — M79604 Pain in right leg: Secondary | ICD-10-CM | POA: Diagnosis not present

## 2017-05-17 DIAGNOSIS — R2681 Unsteadiness on feet: Secondary | ICD-10-CM | POA: Diagnosis not present

## 2017-05-17 DIAGNOSIS — M25552 Pain in left hip: Secondary | ICD-10-CM | POA: Diagnosis not present

## 2017-05-17 DIAGNOSIS — M79605 Pain in left leg: Secondary | ICD-10-CM

## 2017-05-17 NOTE — Patient Instructions (Addendum)
You need to do some leg stretching and exercises. Use your Meloxicam, take 1/2 (half) tablet daily.   Use a cane to help you move more safely. It can also help you walk longer and relieve pain.  Walk two laps to the mailbox every day. Figure out about Physical Therapy and let Debbie know.  It has been a pleasure seeing you today. Denita Lung, RN, Adult-Geriatric Nurse Practitioner Student and Tor Netters, FNP   Hip Exercises Ask your health care provider which exercises are safe for you. Do exercises exactly as told by your health care provider and adjust them as directed. It is normal to feel mild stretching, pulling, tightness, or discomfort as you do these exercises, but you should stop right away if you feel sudden pain or your pain gets worse.Do not begin these exercises until told by your health care provider. STRETCHING AND RANGE OF MOTION EXERCISES These exercises warm up your muscles and joints and improve the movement and flexibility of your hip. These exercises also help to relieve pain, numbness, and tingling. Exercise A: Hamstrings, Supine  1. Lie on your back. 2. Loop a belt or towel over the ball of your left / rightfoot. The ball of your foot is on the walking surface, right under your toes. 3. Straighten your left / rightknee and slowly pull on the belt to raise your leg. ? Do not let your left / right knee bend while you do this. ? Keep your other leg flat on the floor. ? Raise the left / right leg until you feel a gentle stretch behind your left / right knee or thigh. 4. Hold this position for __________ seconds. 5. Slowly return your leg to the starting position. Repeat __________ times. Complete this stretch __________ times a day. Exercise B: Hip Rotators  1. Lie on your back on a firm surface. 2. Hold your left / right knee with your left / right hand. Hold your ankle with your other hand. 3. Gently pull your left / right knee and rotate your lower leg  toward your other shoulder. ? Pull until you feel a stretch in your buttocks. ? Keep your hips and shoulders firmly planted while you do this stretch. 4. Hold this position for __________ seconds. Repeat __________ times. Complete this stretch __________ times a day. Exercise C: V-Sit (Hamstrings and Adductors)  1. Sit on the floor with your legs extended in a large "V" shape. Keep your knees straight during this exercise. 2. Start with your head and chest upright, then bend at your waist to reach for your left foot (position A). You should feel a stretch in your right inner thigh. 3. Hold this position for __________ seconds. Then slowly return to the upright position. 4. Bend at your waist to reach forward (position B). You should feel a stretch behind both of your thighs and knees. 5. Hold this position for __________ seconds. Then slowly return to the upright position. 6. Bend at your waist to reach for your right foot (position C). You should feel a stretch in your left inner thigh. 7. Hold this position for __________ seconds. Then slowly return to the upright position. Repeat __________ times. Complete this stretch __________ times a day. Exercise D: Lunge (Hip Flexors)  1. Place your left / right knee on the floor and bend your other knee so that is directly over your ankle. You should be half-kneeling. 2. Keep good posture with your head over your shoulders. 3. Tighten your  buttocks to point your tailbone downward. This helps your back to keep from arching too much. 4. You should feel a gentle stretch in the front of your left / right thigh and hip. If you do not feel any resistance, slightly slide your other foot forward and then slowly lunge forward so your knee once again lines up over your ankle. 5. Make sure your tailbone continues to point downward. 6. Hold this position for __________ seconds. Repeat __________ times. Complete this stretch __________ times a day. STRENGTHENING  EXERCISES These exercises build strength and endurance in your hip. Endurance is the ability to use your muscles for a long time, even after they get tired. Exercise E: Bridge (Hip Extensors)  1. Lie on your back on a firm surface with your knees bent and your feet flat on the floor. 2. Tighten your buttocks muscles and lift your bottom off the floor until the trunk of your body is level with your thighs. ? Do not arch your back. ? You should feel the muscles working in your buttocks and the back of your thighs. If you do not feel these muscles, slide your feet 1-2 inches (2.5-5 cm) farther away from your buttocks. 3. Hold this position for __________ seconds. 4. Slowly lower your hips to the starting position. 5. Let your muscles relax completely between repetitions. 6. If this exercise is too easy, try doing it with your arms crossed over your chest. Repeat __________ times. Complete this exercise __________ times a day. Exercise F: Straight Leg Raises - Hip Abductors  1. Lie on your side with your left / right leg in the top position. Lie so your head, shoulder, knee, and hip line up with each other. You may bend your bottom knee to help you balance. 2. Roll your hips slightly forward, so your hips are stacked directly over each other and your left / right knee is facing forward. 3. Leading with your heel, lift your top leg 4-6 inches (10-15 cm). You should feel the muscles in your outer hip lifting. ? Do not let your foot drift forward. ? Do not let your knee roll toward the ceiling. 4. Hold this position for __________ seconds. 5. Slowly return to the starting position. 6. Let your muscles relax completely between repetitions. Repeat __________ times. Complete this exercise __________ times a day. Exercise G: Straight Leg Raises - Hip Adductors  1. Lie on your side with your left / right leg in the bottom position. Lie so your head, shoulder, knee, and hip line up. You may place your  upper foot in front to help you balance. 2. Roll your hips slightly forward, so your hips are stacked directly over each other and your left / right knee is facing forward. 3. Tense the muscles in your inner thigh and lift your bottom leg 4-6 inches (10-15 cm). 4. Hold this position for __________ seconds. 5. Slowly return to the starting position. 6. Let your muscles relax completely between repetitions. Repeat __________ times. Complete this exercise __________ times a day. Exercise H: Straight Leg Raises - Quadriceps  1. Lie on your back with your left / right leg extended and your other knee bent. 2. Tense the muscles in the front of your left / right thigh. When you do this, you should see your kneecap slide up or see increased dimpling just above your knee. 3. Tighten these muscles even more and raise your leg 4-6 inches (10-15 cm) off the floor. 4. Hold this position  for __________ seconds. 5. Keep these muscles tense as you lower your leg. 6. Relax the muscles slowly and completely between repetitions. Repeat __________ times. Complete this exercise __________ times a day. Exercise I: Hip Abductors, Standing 1. Tie one end of a rubber exercise band or tubing to a secure surface, such as a table or pole. 2. Loop the other end of the band or tubing around your left / right ankle. 3. Keeping your ankle with the band or tubing directly opposite of the secured end, step away until there is tension in the tubing or band. Hold onto a chair as needed for balance. 4. Lift your left / right leg out to your side. While you do this: ? Keep your back upright. ? Keep your shoulders over your hips. ? Keep your toes pointing forward. ? Make sure to use your hip muscles to lift your leg. Do not "throw" your leg or tip your body to lift your leg. 5. Hold this position for __________ seconds. 6. Slowly return to the starting position. Repeat __________ times. Complete this exercise __________ times a  day. Exercise J: Squats (Quadriceps) 1. Stand in a door frame so your feet and knees are in line with the frame. You may place your hands on the frame for balance. 2. Slowly bend your knees and lower your hips like you are going to sit in a chair. ? Keep your lower legs in a straight-up-and-down position. ? Do not let your hips go lower than your knees. ? Do not bend your knees lower than told by your health care provider. ? If your hip pain increases, do not bend as low. 3. Hold this position for ___________ seconds. 4. Slowly push with your legs to return to standing. Do not use your hands to pull yourself to standing. Repeat __________ times. Complete this exercise __________ times a day. This information is not intended to replace advice given to you by your health care provider. Make sure you discuss any questions you have with your health care provider. Document Released: 02/23/2005 Document Revised: 10/31/2015 Document Reviewed: 01/31/2015 Elsevier Interactive Patient Education  2018 [employer].   Knee Exercises Ask your health care provider which exercises are safe for you. Do exercises exactly as told by your health care provider and adjust them as directed. It is normal to feel mild stretching, pulling, tightness, or discomfort as you do these exercises, but you should stop right away if you feel sudden pain or your pain gets worse.Do not begin these exercises until told by your health care provider. STRETCHING AND RANGE OF MOTION EXERCISES These exercises warm up your muscles and joints and improve the movement and flexibility of your knee. These exercises also help to relieve pain, numbness, and tingling. Exercise A: Knee Extension, Prone 1. Lie on your abdomen on a bed. 2. Place your left / right knee just beyond the edge of the surface so your knee is not on the bed. You can put a towel under your left / right thigh just above your knee for comfort. 3. Relax your leg muscles  and allow gravity to straighten your knee. You should feel a stretch behind your left / right knee. 4. Hold this position for __________ seconds. 5. Scoot up so your knee is supported between repetitions. Repeat __________ times. Complete this stretch __________ times a day. Exercise B: Knee Flexion, Active  1. Lie on your back with both knees straight. If this causes back discomfort, bend your left /  right knee so your foot is flat on the floor. 2. Slowly slide your left / right heel back toward your buttocks until you feel a gentle stretch in the front of your knee or thigh. 3. Hold this position for __________ seconds. 4. Slowly slide your left / right heel back to the starting position. Repeat __________ times. Complete this exercise __________ times a day. Exercise C: Quadriceps, Prone  1. Lie on your abdomen on a firm surface, such as a bed or padded floor. 2. Bend your left / right knee and hold your ankle. If you cannot reach your ankle or pant leg, loop a belt around your foot and grab the belt instead. 3. Gently pull your heel toward your buttocks. Your knee should not slide out to the side. You should feel a stretch in the front of your thigh and knee. 4. Hold this position for __________ seconds. Repeat __________ times. Complete this stretch __________ times a day. Exercise D: Hamstring, Supine 1. Lie on your back. 2. Loop a belt or towel over the ball of your left / right foot. The ball of your foot is on the walking surface, right under your toes. 3. Straighten your left / right knee and slowly pull on the belt to raise your leg until you feel a gentle stretch behind your knee. ? Do not let your left / right knee bend while you do this. ? Keep your other leg flat on the floor. 4. Hold this position for __________ seconds. Repeat __________ times. Complete this stretch __________ times a day. STRENGTHENING EXERCISES These exercises build strength and endurance in your knee.  Endurance is the ability to use your muscles for a long time, even after they get tired. Exercise E: Quadriceps, Isometric  1. Lie on your back with your left / right leg extended and your other knee bent. Put a rolled towel or small pillow under your knee if told by your health care provider. 2. Slowly tense the muscles in the front of your left / right thigh. You should see your kneecap slide up toward your hip or see increased dimpling just above the knee. This motion will push the back of the knee toward the floor. 3. For __________ seconds, keep the muscle as tight as you can without increasing your pain. 4. Relax the muscles slowly and completely. Repeat __________ times. Complete this exercise __________ times a day. Exercise F: Straight Leg Raises - Quadriceps 1. Lie on your back with your left / right leg extended and your other knee bent. 2. Tense the muscles in the front of your left / right thigh. You should see your kneecap slide up or see increased dimpling just above the knee. Your thigh may even shake a bit. 3. Keep these muscles tight as you raise your leg 4-6 inches (10-15 cm) off the floor. Do not let your knee bend. 4. Hold this position for __________ seconds. 5. Keep these muscles tense as you lower your leg. 6. Relax your muscles slowly and completely after each repetition. Repeat __________ times. Complete this exercise __________ times a day. Exercise G: Hamstring, Isometric 1. Lie on your back on a firm surface. 2. Bend your left / right knee approximately __________ degrees. 3. Dig your left / right heel into the surface as if you are trying to pull it toward your buttocks. Tighten the muscles in the back of your thighs to dig as hard as you can without increasing any pain. 4. Hold this  position for __________ seconds. 5. Release the tension gradually and allow your muscles to relax completely for __________ seconds after each repetition. Repeat __________ times.  Complete this exercise __________ times a day. Exercise H: Hamstring Curls  If told by your health care provider, do this exercise while wearing ankle weights. Begin with __________ weights. Then increase the weight by 1 lb (0.5 kg) increments. Do not wear ankle weights that are more than __________. 1. Lie on your abdomen with your legs straight. 2. Bend your left / right knee as far as you can without feeling pain. Keep your hips flat against the floor. 3. Hold this position for __________ seconds. 4. Slowly lower your leg to the starting position.  Repeat __________ times. Complete this exercise __________ times a day. Exercise I: Squats (Quadriceps) 1. Stand in front of a table, with your feet and knees pointing straight ahead. You may rest your hands on the table for balance but not for support. 2. Slowly bend your knees and lower your hips like you are going to sit in a chair. ? Keep your weight over your heels, not over your toes. ? Keep your lower legs upright so they are parallel with the table legs. ? Do not let your hips go lower than your knees. ? Do not bend lower than told by your health care provider. ? If your knee pain increases, do not bend as low. 3. Hold the squat position for __________ seconds. 4. Slowly push with your legs to return to standing. Do not use your hands to pull yourself to standing. Repeat __________ times. Complete this exercise __________ times a day. Exercise J: Wall Slides (Quadriceps)  1. Lean your back against a smooth wall or door while you walk your feet out 18-24 inches (46-61 cm) from it. 2. Place your feet hip-width apart. 3. Slowly slide down the wall or door until your knees bend __________ degrees. Keep your knees over your heels, not over your toes. Keep your knees in line with your hips. 4. Hold for __________ seconds. Repeat __________ times. Complete this exercise __________ times a day. Exercise K: Straight Leg Raises - Hip  Abductors 1. Lie on your side with your left / right leg in the top position. Lie so your head, shoulder, knee, and hip line up. You may bend your bottom knee to help you keep your balance. 2. Roll your hips slightly forward so your hips are stacked directly over each other and your left / right knee is facing forward. 3. Leading with your heel, lift your top leg 4-6 inches (10-15 cm). You should feel the muscles in your outer hip lifting. ? Do not let your foot drift forward. ? Do not let your knee roll toward the ceiling. 4. Hold this position for __________ seconds. 5. Slowly return your leg to the starting position. 6. Let your muscles relax completely after each repetition. Repeat __________ times. Complete this exercise __________ times a day. Exercise L: Straight Leg Raises - Hip Extensors 1. Lie on your abdomen on a firm surface. You can put a pillow under your hips if that is more comfortable. 2. Tense the muscles in your buttocks and lift your left / right leg about 4-6 inches (10-15 cm). Keep your knee straight as you lift your leg. 3. Hold this position for __________ seconds. 4. Slowly lower your leg to the starting position. 5. Let your leg relax completely after each repetition. Repeat __________ times. Complete this exercise __________ times  a day. This information is not intended to replace advice given to you by your health care provider. Make sure you discuss any questions you have with your health care provider. Document Released: 12/20/2004 Document Revised: 10/31/2015 Document Reviewed: 12/12/2014 Elsevier Interactive Patient Education  2018 [employer].

## 2017-05-17 NOTE — Progress Notes (Signed)
Subjective:    Patient ID: Dorothy Church, female    DOB: 1935-03-03, 82 y.o.   MRN: [id number]  HPI Dorothy Church is a 82 y.o. female who presents today with a CC of Bilateral Leg pain (L>R). Started about a year ago when she was diagnosed with Spinal stenosis which she received injections for. Has been exercising which causes increased muscle pain in knee and hip. Denies numbness or tingling. Did break a couple of bones in R foot in the beginning of year and was prescribed Meloxicam 15mg  that she never took but did wear pedal boot for 6 weeks. She thinks that all of her issues are coming from having to compensate from wearing the boot. Does not use cane or walker and is an avid Materials engineer. She does report a recent "drifting" to her floor when getting out of bed in the morning and trouble getting up. Does try to get some exercises but gets SOB d/t chronic asthma and increasing outside allergens  Always constipated with BM every other day and takes stool softeners and eats prunes. Recently diagnosed with Diverticulitis. Denies abdominal pain or blood in stool. Regularly follows up with Gastroenterologist yearly or more for symptoms.  Review of Systems  Constitutional: Positive for activity change. Negative for fatigue.  Respiratory: Positive for shortness of breath.   Cardiovascular: Negative for chest pain.  Musculoskeletal: Positive for arthralgias and gait problem. Negative for back pain and joint swelling.       Past Medical History:  Diagnosis Date  . Aortic valve disorders   . Arthritis   . Barrett's esophagus   . COPD (chronic obstructive pulmonary disease) ([place])   . Hypercholesteremia   . Hypothyroidism   . Impaired fasting glucose   . Intrinsic asthma, unspecified   . Obstructive sleep apnea (adult) (pediatric)   . Osteoporosis   . Pure hypercholesterolemia   . Syncope and collapse   . TIA (transient ischemic attack)   . Vitamin D deficiency    Past Surgical  History:  Procedure Laterality Date  . BREAST EXCISIONAL BIOPSY Left   . COLONOSCOPY    . COLONOSCOPY WITH PROPOFOL N/A 12/14/2014   Procedure: COLONOSCOPY WITH PROPOFOL;  Surgeon: Lollie Sails, MD;  Location: [facility] ENDOSCOPY;  Service: Endoscopy;  Laterality: N/A;  . ESOPHAGOGASTRODUODENOSCOPY (EGD) WITH PROPOFOL N/A 12/14/2014   Procedure: ESOPHAGOGASTRODUODENOSCOPY (EGD) WITH PROPOFOL;  Surgeon: Lollie Sails, MD;  Location: [facility] ENDOSCOPY;  Service: Endoscopy;  Laterality: N/A;  . ESOPHAGOGASTRODUODENOSCOPY (EGD) WITH PROPOFOL N/A 10/18/2016   Procedure: ESOPHAGOGASTRODUODENOSCOPY (EGD) WITH PROPOFOL;  Surgeon: Lollie Sails, MD;  Location: [facility] ENDOSCOPY;  Service: Endoscopy;  Laterality: N/A;  . EYE SURGERY    . HIP SURGERY    . prosthesis eye implant    . REPLACEMENT UNICONDYLAR JOINT KNEE    . TONSILLECTOMY     Family History  Problem Relation Age of Onset  . Heart disease Father   . Alcohol abuse Father   . Breast cancer Mother   . Bipolar disorder Brother    Social History   Socioeconomic History  . Marital status: Divorced    Spouse name: Not on file  . Number of children: Not on file  . Years of education: Not on file  . Highest education level: Not on file  Occupational History  . Not on file  Social Needs  . Financial resource strain: Not on file  . Food insecurity:    Worry: Not on file    Inability: Not  on file  . Transportation needs:    Medical: Not on file    Non-medical: Not on file  Tobacco Use  . Smoking status: Never Smoker  . Smokeless tobacco: Never Used  Substance and Sexual Activity  . Alcohol use: Yes    Alcohol/week: 0.6 oz    Types: 1 Glasses of wine per week    Comment: occas. wine  . Drug use: No  . Sexual activity: Never  Lifestyle  . Physical activity:    Days per week: Not on file    Minutes per session: Not on file  . Stress: Not on file  Relationships  . Social connections:    Talks on phone: Not on file     Gets together: Not on file    Attends religious service: Not on file    Active member of club or organization: Not on file    Attends meetings of clubs or organizations: Not on file    Relationship status: Not on file  . Intimate partner violence:    Fear of current or ex partner: Not on file    Emotionally abused: Not on file    Physically abused: Not on file    Forced sexual activity: Not on file  Other Topics Concern  . Not on file  Social History Narrative   Lived in [place], moved to [place] two years ago.   Divorced.      Has a living will.   Would desire CPR, would not desire extraordinary measures.         Current Outpatient Medications on File Prior to Visit  Medication Sig Dispense Refill  . acetaminophen (TYLENOL) 325 MG tablet Take 650 mg by mouth every 6 (six) hours as needed.    Marland Kitchen albuterol (VENTOLIN HFA) 108 (90 BASE) MCG/ACT inhaler Inhale 2 puffs into the lungs every 6 (six) hours as needed for wheezing. 18 g 5  . aspirin EC 81 MG EC tablet Take 1 tablet (81 mg total) by mouth daily.    . budesonide-formoterol (SYMBICORT) 160-4.5 MCG/ACT inhaler Inhale 2 puffs into the lungs 2 (two) times daily. 3 Inhaler 3  . calcium carbonate (OS-CAL) 600 MG TABS Take 600 mg by mouth daily.    Marland Kitchen dexlansoprazole (DEXILANT) 60 MG capsule Take 60 mg by mouth daily.    . diclofenac sodium (VOLTAREN) 1 % GEL Apply 4 g topically 4 (four) times daily. 5 Tube 11  . levothyroxine (SYNTHROID, LEVOTHROID) 50 MCG tablet TAKE 1 TABLET BY MOUTH  DAILY BEFORE BREAKFAST 90 tablet 1  . Multiple Vitamin (MULTIVITAMIN) tablet Take 1 tablet by mouth daily.    Marland Kitchen PARoxetine (PAXIL) 30 MG tablet Take 1 tablet (30 mg total) by mouth every morning. 90 tablet 3  . simvastatin (ZOCOR) 20 MG tablet Take 1 tablet (20 mg total) by mouth at bedtime. 90 tablet 3   Current Facility-Administered Medications on File Prior to Visit  Medication Dose Route Frequency Provider Last Rate Last Dose  .  denosumab (PROLIA) injection 60 mg  60 mg Subcutaneous Q6 months Lucille Passy, MD   60 mg at 12/13/15 1422    Objective:   Physical Exam  Constitutional: She appears well-nourished. No distress.  Musculoskeletal: She exhibits no tenderness.       Right hip: She exhibits normal range of motion and normal strength.       Left hip: She exhibits decreased range of motion and decreased strength.       Right  knee: She exhibits decreased range of motion and swelling. She exhibits no bony tenderness. No tenderness found.       Left knee: She exhibits decreased range of motion. She exhibits no swelling and no bony tenderness. No tenderness found.   BP 112/62   Pulse 82   Temp 98.1 F (36.7 C) (Oral)   Wt 143 lb 4 oz (65 kg)   SpO2 97%   BMI 22.77 kg/m     Assessment & Plan:   1. Leg pain, bilateral - Do exercises given in AVS - Meloxicam 1/2 tab daily - Physical Therapy  Denita Lung, RN, Adult-Geriatric Nurse Practitioner Student

## 2017-05-17 NOTE — Progress Notes (Signed)
Subjective:    Patient ID: Dorothy Church, female    DOB: Jul 17, 1935, 82 y.o.   MRN: [id number]  HPI This is a 82 yo female with bilateral leg pain L>R for about a year. No numbness or tingling. No recent injury. Broke her left foot and was in a boot for 6 weeks, feels that this exacerbated left leg pain. Left leg pain is achy, goes from lateral hip to knee. She has history of total knee replacement many years ago, never regained full ROM and always depended on right leg more. Right leg with pain around knee and lower leg. She recently slid down the edge of the bed to the floor. No injury, was able to get up to her feet with some difficulty. Has known spinal stenosis. Was given meloxicam 15 mg in past, but did not take because she was concerned about potential side effects.   COPD- has DOE if she walks very far, feels limited in her activities. No chest pain, no wheeze, worse seasonally.   Diverticulosis- BM every other day, no blood, no abdominal pain.  Past Medical History:  Diagnosis Date  . Aortic valve disorders   . Arthritis   . Barrett's esophagus   . COPD (chronic obstructive pulmonary disease) ([place])   . Hypercholesteremia   . Hypothyroidism   . Impaired fasting glucose   . Intrinsic asthma, unspecified   . Obstructive sleep apnea (adult) (pediatric)   . Osteoporosis   . Pure hypercholesterolemia   . Syncope and collapse   . TIA (transient ischemic attack)   . Vitamin D deficiency    Past Surgical History:  Procedure Laterality Date  . BREAST EXCISIONAL BIOPSY Left   . COLONOSCOPY    . COLONOSCOPY WITH PROPOFOL N/A 12/14/2014   Procedure: COLONOSCOPY WITH PROPOFOL;  Surgeon: Lollie Sails, MD;  Location: [facility] ENDOSCOPY;  Service: Endoscopy;  Laterality: N/A;  . ESOPHAGOGASTRODUODENOSCOPY (EGD) WITH PROPOFOL N/A 12/14/2014   Procedure: ESOPHAGOGASTRODUODENOSCOPY (EGD) WITH PROPOFOL;  Surgeon: Lollie Sails, MD;  Location: [facility] ENDOSCOPY;  Service: Endoscopy;   Laterality: N/A;  . ESOPHAGOGASTRODUODENOSCOPY (EGD) WITH PROPOFOL N/A 10/18/2016   Procedure: ESOPHAGOGASTRODUODENOSCOPY (EGD) WITH PROPOFOL;  Surgeon: Lollie Sails, MD;  Location: [facility] ENDOSCOPY;  Service: Endoscopy;  Laterality: N/A;  . EYE SURGERY    . HIP SURGERY    . prosthesis eye implant    . REPLACEMENT UNICONDYLAR JOINT KNEE    . TONSILLECTOMY     Family History  Problem Relation Age of Onset  . Heart disease Father   . Alcohol abuse Father   . Breast cancer Mother   . Bipolar disorder Brother    Social History   Tobacco Use  . Smoking status: Never Smoker  . Smokeless tobacco: Never Used  Substance Use Topics  . Alcohol use: Yes    Alcohol/week: 0.6 oz    Types: 1 Glasses of wine per week    Comment: occas. wine  . Drug use: No      Review of Systems Per HPI    Objective:   Physical Exam  Constitutional: She is oriented to person, place, and time. She appears well-developed and well-nourished. No distress.  HENT:  Head: Normocephalic and atraumatic.  Cardiovascular: Normal rate and regular rhythm.  Pulmonary/Chest: Effort normal and breath sounds normal.  Musculoskeletal:  No spinal point tenderness. Back with good ROM. Hips with good flexion, decreased internal/external rotation. Good strength. Right knee with good ROM, left knee with decreased ROM. Poor quadricept strength bilaterally (  L>R).   Neurological: She is alert and oriented to person, place, and time.  Skin: Skin is warm and dry. She is not diaphoretic.  Psychiatric: She has a normal mood and affect. Her behavior is normal. Judgment and thought content normal.  Vitals reviewed.     BP 112/62   Pulse 82   Temp 98.1 F (36.7 C) (Oral)   Wt 143 lb 4 oz (65 kg)   SpO2 97%   BMI 22.77 kg/m  Wt Readings from Last 3 Encounters:  05/17/17 143 lb 4 oz (65 kg)  05/01/17 145 lb (65.8 kg)  04/15/17 146 lb (66.2 kg)       Assessment & Plan:  1. Leg pain, bilateral - suspect chronic  osteoarthritis exacerbated by wearing boot. Provided strengthening exercises and discussed using meloxicam 1/2 of 15 mg tablet daily for several days then PRN.  - discussed PT and she will look into her insurance coverage and consider  2. Left hip pain - see #1  3. Unsteady gait - needs strengthening, encouraged her to participate in [place] and/or chair yoga - she was instructed to use her cane at all times with ambulation  4. COPD - continue inhalers - encouraged her to walk increased distances  - follow up as scheduled in 2 months   Clarene Reamer, FNP-BC  [facility] at [facility], [place]  05/17/2017 2:28 PM

## 2017-05-21 ENCOUNTER — Encounter: Payer: Self-pay | Admitting: Family Medicine

## 2017-05-22 ENCOUNTER — Other Ambulatory Visit: Payer: Self-pay | Admitting: Family Medicine

## 2017-05-22 DIAGNOSIS — R69 Illness, unspecified: Secondary | ICD-10-CM | POA: Diagnosis not present

## 2017-05-22 MED ORDER — PAROXETINE HCL 30 MG PO TABS: 30.0000 mg | ORAL_TABLET | Freq: Every morning | ORAL | 3 refills | Status: DC

## 2017-05-22 MED ORDER — SIMVASTATIN 20 MG PO TABS: 20.0000 mg | ORAL_TABLET | Freq: Every day | ORAL | 3 refills | Status: DC

## 2017-06-05 ENCOUNTER — Encounter: Payer: Self-pay | Admitting: Family Medicine

## 2017-06-10 ENCOUNTER — Ambulatory Visit: Payer: Medicare Other | Admitting: Family Medicine

## 2017-06-12 ENCOUNTER — Encounter: Payer: Self-pay | Admitting: Family Medicine

## 2017-06-17 ENCOUNTER — Ambulatory Visit (INDEPENDENT_AMBULATORY_CARE_PROVIDER_SITE_OTHER): Payer: Medicare HMO | Admitting: Family Medicine

## 2017-06-17 ENCOUNTER — Encounter: Payer: Self-pay | Admitting: Family Medicine

## 2017-06-17 VITALS — BP 98/58 | HR 86 | Temp 98.1°F | Ht 66.5 in | Wt 146.0 lb

## 2017-06-17 DIAGNOSIS — R1310 Dysphagia, unspecified: Secondary | ICD-10-CM

## 2017-06-17 DIAGNOSIS — K219 Gastro-esophageal reflux disease without esophagitis: Secondary | ICD-10-CM

## 2017-06-17 DIAGNOSIS — M79604 Pain in right leg: Secondary | ICD-10-CM

## 2017-06-17 DIAGNOSIS — M79605 Pain in left leg: Secondary | ICD-10-CM | POA: Diagnosis not present

## 2017-06-17 NOTE — Progress Notes (Signed)
Subjective:    Patient ID: Dorothy Church, female    DOB: [birth year], 82 y.o.   MRN: [id number]  HPI This is a 82 yo female who presents today for follow up of bilateral leg pain. Improved with stretching and meloxicam.  Has noticed continued acid reflux, takes Dexilant daily, occasional Tagamet. Worse with spicy foods. Has noticed some more difficulty getting pills down. Sees Dr. Gustavo Lah, had upper endoscopy 8/18. Plans to follow up with him.    Past Medical History:  Diagnosis Date  . Aortic valve disorders   . Arthritis   . Barrett's esophagus   . COPD (chronic obstructive pulmonary disease) ([place])   . Hypercholesteremia   . Hypothyroidism   . Impaired fasting glucose   . Intrinsic asthma, unspecified   . Obstructive sleep apnea (adult) (pediatric)   . Osteoporosis   . Pure hypercholesterolemia   . Syncope and collapse   . TIA (transient ischemic attack)   . Vitamin D deficiency    Past Surgical History:  Procedure Laterality Date  . BREAST EXCISIONAL BIOPSY Left   . COLONOSCOPY    . COLONOSCOPY WITH PROPOFOL N/A 12/14/2014   Procedure: COLONOSCOPY WITH PROPOFOL;  Surgeon: Lollie Sails, MD;  Location: [facility] ENDOSCOPY;  Service: Endoscopy;  Laterality: N/A;  . ESOPHAGOGASTRODUODENOSCOPY (EGD) WITH PROPOFOL N/A 12/14/2014   Procedure: ESOPHAGOGASTRODUODENOSCOPY (EGD) WITH PROPOFOL;  Surgeon: Lollie Sails, MD;  Location: [facility] ENDOSCOPY;  Service: Endoscopy;  Laterality: N/A;  . ESOPHAGOGASTRODUODENOSCOPY (EGD) WITH PROPOFOL N/A 10/18/2016   Procedure: ESOPHAGOGASTRODUODENOSCOPY (EGD) WITH PROPOFOL;  Surgeon: Lollie Sails, MD;  Location: [facility] ENDOSCOPY;  Service: Endoscopy;  Laterality: N/A;  . EYE SURGERY    . HIP SURGERY    . prosthesis eye implant    . REPLACEMENT UNICONDYLAR JOINT KNEE    . TONSILLECTOMY     Family History  Problem Relation Age of Onset  . Heart disease Father   . Alcohol abuse Father   . Breast cancer Mother   . Bipolar disorder  Brother    Social History   Tobacco Use  . Smoking status: Never Smoker  . Smokeless tobacco: Never Used  Substance Use Topics  . Alcohol use: Yes    Alcohol/week: 0.6 oz    Types: 1 Glasses of wine per week    Comment: occas. wine  . Drug use: No      Review of Systems Per HPI    Objective:   Physical Exam Physical Exam  Constitutional: Oriented to person, place, and time. She appears well-developed and well-nourished.  HENT:  Head: Normocephalic and atraumatic.  Eyes: Conjunctivae are normal.  Cardiovascular: Normal rate, regular rhythm and normal heart sounds.   Pulmonary/Chest: Effort normal and breath sounds normal.  Musculoskeletal: No edema  Neurological: Alert and oriented to person, place, and time.  Skin: Skin is warm and dry.  Psychiatric: Normal mood and affect. Behavior is normal. Judgment and thought content normal.  Vitals reviewed.     BP (!) 98/58 (BP Location: Right Arm, Patient Position: Sitting, Cuff Size: Normal)   Pulse 86   Temp 98.1 F (36.7 C) (Oral)   Ht 5' 6.5" (1.689 m)   Wt 146 lb (66.2 kg)   SpO2 95%   BMI 23.21 kg/m  Wt Readings from Last 3 Encounters:  06/17/17 146 lb (66.2 kg)  05/17/17 143 lb 4 oz (65 kg)  05/01/17 145 lb (65.8 kg)   BP Readings from Last 3 Encounters:  06/17/17 (!) 98/58  05/17/17 112/62  05/01/17 116/75       Assessment & Plan:  1. Leg pain, bilateral - improved, continue stretching, increased activity as tolerated  2. Gastroesophageal reflux disease without esophagitis - encouraged her to schedule with GI, she agrees  3. Dysphagia, unspecified type - schedule with GI per #2   Clarene Reamer, FNP-BC  [facility] at [facility], [place]  06/17/2017 12:51 PM

## 2017-06-17 NOTE — Patient Instructions (Signed)
Schedule your follow up with GI  Continue your exercises  Follow up in 4-6 months

## 2017-06-20 ENCOUNTER — Encounter: Payer: Self-pay | Admitting: Internal Medicine

## 2017-06-20 ENCOUNTER — Ambulatory Visit: Payer: Medicare HMO | Admitting: Internal Medicine

## 2017-06-20 VITALS — BP 100/62 | HR 87 | Resp 16 | Ht 66.5 in | Wt 146.0 lb

## 2017-06-20 DIAGNOSIS — J4520 Mild intermittent asthma, uncomplicated: Secondary | ICD-10-CM | POA: Diagnosis not present

## 2017-06-20 DIAGNOSIS — G4733 Obstructive sleep apnea (adult) (pediatric): Secondary | ICD-10-CM | POA: Diagnosis not present

## 2017-06-20 NOTE — Progress Notes (Signed)
[facility] Consultation      MRN# [id number] Dorothy Church [birth year]   CC: Chief Complaint  Patient presents with  . Sleep Apnea    pt wears cpap nightly averaging 8 hrs except if she is sick.  . Asthma    controlled depending on the weather.      Brief History:  82 year old female past medical history of asthma  Patient states she had asthma during childhood where she had frequent hospitalizations but no intubations. Patient states her breathing is trouble with certain triggers such as mold, dust, damp areas. She was told about 45 years ago she had COPD and was also placed on Symbicort at that time along with as needed albuterol. She has a history of obstructive sleep apnea diagnosed 8 years ago and she's currently on CPAP; she doesn't recall her CPAP settings. Patient states she never smoked tobacco had any secondhand exposure.  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   ROV 06/17/14 Patient presents today for a follow up visit, doing well today. Back to using symbicort. Had a CT Chest since last visit. Only using rescue inhaler about 1 time a week since last visit.   Plan: - pulmonary function testing and 6 minute walk test prior to follow up - Spiriva Respimat (2.39mcg) - 2 puff once a day - Patient given a sample - if having improvement by week 2, then patient will call us and we can fill this prescription - continue with Symbicort - continue with albuterol rescue inhaler - 2puff every 3-4 hours as needed for shortness of breath\wheezing\recurrent cough - a followup visit will consider further evaluation with GI referral if patient is still symptomatic with shortness of breath and cough  ROV 08/16/14: Patient presents for follow up visit of COPD along with PFTs and 6 MWT  Did not call in refill for spiriva, but stated that the 2 week trial was helpful Using Symbicort as  directed.  Patient states overall she is doing well except she has shortness of breath mainly with exertion. No cough, no wheeze, no chest tightness. Plan: - Spiriva Respimat (2.59mcg) - 2 puff once a day - Patient given a sample - continue with Symbicort - continue with albuterol rescue inhaler - 2puff every 3-4 hours as needed for shortness of breath\wheezing\recurrent cough - Referral to pulmonary rehabilitation  ROV 11/10/14 Patient presents today for a follow up visit.  Still with some dyspnea, but states that it has been improving. Currently on symbicort and spiriva.  States that she is in the insurance lapse, and her inhalers are very expensive.  Currently attending pulm rehab, attended 3 weeks so far.   ONO in 09/2014 showed that she does not require supplemental O2 at night.  Plan -continue with Symbicort and Spiriva, albuterol as needed, exercise as tolerated.   ROV 5.2.19 She presents today for follow-up visit of her asthma and OSA. no URIs this year, no need for antibiotics and extra use of inhalers. Patient uses Symbicort 2 puffs daily in the morning but does not take the second dose at nighttime and this has helped her symptoms and not had any exacerbations at this time Patient states she uses her autoCPAP at nighttime AHI is 0.4  100% compliance days 73% compliance >4hrs   Patient states that Claritin has helped significantly Her asthma is well controlled at this time No signs of infection at this time  Medication:    Current Outpatient Medications:  .  acetaminophen (TYLENOL) 325 MG tablet, Take 650 mg by mouth every 6 (six) hours as needed., Disp: , Rfl:  .  albuterol (VENTOLIN HFA) 108 (90 BASE) MCG/ACT inhaler, Inhale 2 puffs into the lungs every 6 (six) hours as needed for wheezing., Disp: 18 g, Rfl: 5 .  aspirin EC 81 MG EC tablet, Take 1 tablet (81 mg total) by mouth daily., Disp: , Rfl:  .  budesonide-formoterol (SYMBICORT) 160-4.5 MCG/ACT inhaler,  Inhale 2 puffs into the lungs 2 (two) times daily., Disp: 3 Inhaler, Rfl: 3 .  calcium carbonate (OS-CAL) 600 MG TABS, Take 600 mg by mouth daily., Disp: , Rfl:  .  dexlansoprazole (DEXILANT) 60 MG capsule, Take 60 mg by mouth daily., Disp: , Rfl:  .  diclofenac sodium (VOLTAREN) 1 % GEL, Apply 4 g topically 4 (four) times daily., Disp: 5 Tube, Rfl: 11 .  levothyroxine (SYNTHROID, LEVOTHROID) 50 MCG tablet, TAKE 1 TABLET BY MOUTH  DAILY BEFORE BREAKFAST, Disp: 90 tablet, Rfl: 1 .  Multiple Vitamin (MULTIVITAMIN) tablet, Take 1 tablet by mouth daily., Disp: , Rfl:  .  PARoxetine (PAXIL) 30 MG tablet, Take 1 tablet (30 mg total) by mouth every morning., Disp: 90 tablet, Rfl: 3 .  simvastatin (ZOCOR) 20 MG tablet, Take 1 tablet (20 mg total) by mouth at bedtime., Disp: 90 tablet, Rfl: 3  Current Facility-Administered Medications:  .  denosumab (PROLIA) injection 60 mg, 60 mg, Subcutaneous, Q6 months, Deborra Medina, Talia M, MD, 60 mg at 12/13/15 1422    Review of Systems  Constitutional: Negative for chills and fever.  Eyes: Negative for blurred vision.  Respiratory: Negative for cough, hemoptysis, sputum production and shortness of breath.   Cardiovascular: Negative for chest pain and palpitations.  Gastrointestinal: Negative for heartburn.  Genitourinary: Negative for dysuria.  Musculoskeletal: Negative for myalgias.  Skin: Negative for itching and rash.  Neurological: Negative for dizziness and headaches.  Endo/Heme/Allergies: Does not bruise/bleed easily.  Psychiatric/Behavioral: Negative for depression.      Allergies:  Amoxicillin; Cefzil [cefprozil]; Cephalexin; Propoxyphene; Relafen [nabumetone]; and Talwin [pentazocine]  Physical Examination:  Resp 16   Ht 5' 6.5" (1.689 m)   Wt 146 lb (66.2 kg)   BMI 23.21 kg/m   General Appearance: No distress  HEENT: PERRLA, no other lesions noticed Pulmonary:normal breath sounds., diaphragmatic excursion normal.No wheezing, No rales     Cardiovascular:  Normal S1,S2.  No m/r/g.     Abdomen:Exam: Benign, Soft, non-tender, No masses  Skin:   warm, no rashes, no ecchymosis  Extremities: normal, no cyanosis, clubbing, warm with normal capillary refill.    Assessment and Plan: 82 yo F with PMHx of mild persistent Asthma and OSA presenting for OSA follow-up in the setting of allergic rhinitis At this time her asthma is well controlled with Symbicort 2 puffs daily and she has not had to use her albuterol often Patient will need to send a compliance report for her underlying sleep apnea assessment and it is not available at this time   #1 Obstructive sleep apnea on CPAP Will need compliance report to assess for underlying sleep apnea  Plan: -Continue with AutoPap AHI 0.4  Patient is compliant and benefits from CPAP therapy  #2 chronic persistent asthma Patient with prolonged chronic asthma and is a never smoker with no significant exposure to 2nd hand smoke. PFTs with significant reversibility.  -continue symbicort 2 puffs daily and as needed at night - cont with exercise -  albuterol as needed.   #3 allergic rhinitis Continue Claritin  #4 deconditioned state Recommend exercise daily  Patient satisfied with Plan of action and management. All questions answered Follow-up in 6 months   Kaleia Longhi Patricia Pesa, M.D.  [facility]  Medical Director [place] Director [facility] Cardio-Pulmonary Department

## 2017-06-20 NOTE — Patient Instructions (Signed)
Continue inhalers as prescribed Continue CPAP therapy as prescribed  

## 2017-06-26 ENCOUNTER — Other Ambulatory Visit: Payer: Self-pay

## 2017-06-26 ENCOUNTER — Ambulatory Visit: Payer: Medicare HMO | Attending: Anesthesiology | Admitting: Anesthesiology

## 2017-06-26 ENCOUNTER — Encounter: Payer: Self-pay | Admitting: Anesthesiology

## 2017-06-26 VITALS — BP 122/74 | HR 89 | Temp 97.9°F | Resp 18 | Ht 66.0 in | Wt 145.0 lb

## 2017-06-26 DIAGNOSIS — M48062 Spinal stenosis, lumbar region with neurogenic claudication: Secondary | ICD-10-CM

## 2017-06-26 DIAGNOSIS — M5136 Other intervertebral disc degeneration, lumbar region: Secondary | ICD-10-CM

## 2017-06-26 DIAGNOSIS — Z7982 Long term (current) use of aspirin: Secondary | ICD-10-CM | POA: Insufficient documentation

## 2017-06-26 DIAGNOSIS — Z79890 Hormone replacement therapy (postmenopausal): Secondary | ICD-10-CM | POA: Diagnosis not present

## 2017-06-26 DIAGNOSIS — Z79899 Other long term (current) drug therapy: Secondary | ICD-10-CM | POA: Diagnosis not present

## 2017-06-26 DIAGNOSIS — Z7951 Long term (current) use of inhaled steroids: Secondary | ICD-10-CM | POA: Insufficient documentation

## 2017-06-26 DIAGNOSIS — M5431 Sciatica, right side: Secondary | ICD-10-CM | POA: Diagnosis not present

## 2017-06-26 DIAGNOSIS — M79661 Pain in right lower leg: Secondary | ICD-10-CM | POA: Diagnosis present

## 2017-06-26 DIAGNOSIS — M545 Low back pain: Secondary | ICD-10-CM | POA: Diagnosis present

## 2017-06-26 NOTE — Patient Instructions (Signed)
Follow up in 3 months

## 2017-06-26 NOTE — Progress Notes (Signed)
Safety precautions to be maintained throughout the outpatient stay will include: orient to surroundings, keep bed in low position, maintain call bell within reach at all times, provide assistance with transfer out of bed and ambulation.  

## 2017-06-26 NOTE — Progress Notes (Signed)
Subjective:  Patient ID: Dorothy Church, female    DOB: 28-May-1935  Age: 82 y.o. MRN: [id number]  CC: Leg Pain (right, lower)   Procedure: None  HPI Dorothy Church presents for reevaluation.  She was last seen a month ago and had her third epidural at that time.  She maintains that at this point her low back pain is under good control and she is having no significant lower extremity pain.  She is doing her stretching strengthening exercises as prescribed and tolerating these well.  No new changes in bowel or bladder function or lower extremity strength or function are noted at this time.  She is using no opioids and is using occasional Tylenol for her low back pain.  Otherwise she is in her usual state of health.  Outpatient Medications Prior to Visit  Medication Sig Dispense Refill  . acetaminophen (TYLENOL) 325 MG tablet Take 650 mg by mouth every 6 (six) hours as needed.    Marland Kitchen albuterol (VENTOLIN HFA) 108 (90 BASE) MCG/ACT inhaler Inhale 2 puffs into the lungs every 6 (six) hours as needed for wheezing. 18 g 5  . aspirin EC 81 MG EC tablet Take 1 tablet (81 mg total) by mouth daily.    . budesonide-formoterol (SYMBICORT) 160-4.5 MCG/ACT inhaler Inhale 2 puffs into the lungs 2 (two) times daily. 3 Inhaler 3  . calcium carbonate (OS-CAL) 600 MG TABS Take 600 mg by mouth daily.    Marland Kitchen dexlansoprazole (DEXILANT) 60 MG capsule Take 60 mg by mouth daily.    . diclofenac sodium (VOLTAREN) 1 % GEL Apply 4 g topically 4 (four) times daily. 5 Tube 11  . esomeprazole (NEXIUM) 40 MG capsule Take 40 mg by mouth daily at 12 noon.    Marland Kitchen levothyroxine (SYNTHROID, LEVOTHROID) 50 MCG tablet TAKE 1 TABLET BY MOUTH  DAILY BEFORE BREAKFAST 90 tablet 1  . Multiple Vitamin (MULTIVITAMIN) tablet Take 1 tablet by mouth daily.    Marland Kitchen PARoxetine (PAXIL) 30 MG tablet Take 1 tablet (30 mg total) by mouth every morning. 90 tablet 3  . simvastatin (ZOCOR) 20 MG tablet Take 1 tablet (20 mg total) by mouth at bedtime. 90  tablet 3   Facility-Administered Medications Prior to Visit  Medication Dose Route Frequency Provider Last Rate Last Dose  . denosumab (PROLIA) injection 60 mg  60 mg Subcutaneous Q6 months Deborra Medina, Talia M, MD   60 mg at 12/13/15 1422    Review of Systems CNS: No confusion or sedation Cardiac: No angina or palpitations GI: No abdominal pain or constipation Constitutional: No nausea vomiting fevers or chills  Objective:  BP 122/74   Pulse 89   Temp 97.9 F (36.6 C) (Oral)   Resp 18   Ht 5\' 6"  (1.676 m)   Wt 145 lb (65.8 kg)   SpO2 97%   BMI 23.40 kg/m    BP Readings from Last 3 Encounters:  06/26/17 122/74  06/20/17 100/62  06/17/17 (!) 98/58     Wt Readings from Last 3 Encounters:  06/26/17 145 lb (65.8 kg)  06/20/17 146 lb (66.2 kg)  06/17/17 146 lb (66.2 kg)     Physical Exam Pt is alert and oriented PERRL EOMI HEART IS RRR no murmur or rub LCTA no wheezing or rales MUSCULOSKELETAL feels some paraspinous muscle tenderness but her lower extremity strength and function are stable with no change in muscle tone or bulk.  Labs  Lab Results  Component Value Date   HGBA1C 5.3 12/12/2016  Lab Results  Component Value Date   LDLCALC 75 12/12/2016   CREATININE 0.76 04/04/2017    -------------------------------------------------------------------------------------------------------------------- Lab Results  Component Value Date   WBC 10.4 04/04/2017   HGB 13.5 04/04/2017   HCT 39.3 04/04/2017   PLT 252.0 04/04/2017   GLUCOSE 83 04/04/2017   CHOL 144 12/12/2016   TRIG 109 12/12/2016   HDL 47 12/12/2016   LDLCALC 75 12/12/2016   ALT 16 12/11/2016   AST 21 12/11/2016   NA 137 04/04/2017   K 4.6 04/04/2017   CL 102 04/04/2017   CREATININE 0.76 04/04/2017   BUN 14 04/04/2017   CO2 29 04/04/2017   TSH 1.53 10/24/2016   INR 0.99 12/11/2016   HGBA1C 5.3 12/12/2016     --------------------------------------------------------------------------------------------------------------------- Dg C-arm 1-60 Min-no Report  Result Date: 05/01/2017 Fluoroscopy was utilized by the requesting physician.  No radiographic interpretation.     Assessment & Plan:   Dorothy Church was seen today for leg pain.  Diagnoses and all orders for this visit:  DDD (degenerative disc disease), lumbar  Sciatica of right side  Spinal stenosis of lumbar region with neurogenic claudication        ----------------------------------------------------------------------------------------------------------------------  Problem List Items Addressed This Visit      Unprioritized   DDD (degenerative disc disease), lumbar - Primary    Other Visit Diagnoses    Sciatica of right side       Spinal stenosis of lumbar region with neurogenic claudication            ----------------------------------------------------------------------------------------------------------------------  1. DDD (degenerative disc disease), lumbar Continue with core stretching strengthening exercises as previously prescribed.  We will have her return to clinic in 3 months for re-eval  2. Sciatica of right side As above  3. Spinal stenosis of lumbar region with neurogenic claudication As above.  She is been instructed to contact the pain control center for earlier follow-up if indicated.  She may be a candidate for periodic epidural steroid injections as indicated.    ----------------------------------------------------------------------------------------------------------------------  I am having Dorothy Church maintain her calcium carbonate, multivitamin, albuterol, diclofenac sodium, acetaminophen, dexlansoprazole, budesonide-formoterol, aspirin, levothyroxine, simvastatin, PARoxetine, and esomeprazole. We will continue to administer denosumab.   No orders of the defined types were placed in  this encounter.  Patient's Medications  New Prescriptions   No medications on file  Previous Medications   ACETAMINOPHEN (TYLENOL) 325 MG TABLET    Take 650 mg by mouth every 6 (six) hours as needed.   ALBUTEROL (VENTOLIN HFA) 108 (90 BASE) MCG/ACT INHALER    Inhale 2 puffs into the lungs every 6 (six) hours as needed for wheezing.   ASPIRIN EC 81 MG EC TABLET    Take 1 tablet (81 mg total) by mouth daily.   BUDESONIDE-FORMOTEROL (SYMBICORT) 160-4.5 MCG/ACT INHALER    Inhale 2 puffs into the lungs 2 (two) times daily.   CALCIUM CARBONATE (OS-CAL) 600 MG TABS    Take 600 mg by mouth daily.   DEXLANSOPRAZOLE (DEXILANT) 60 MG CAPSULE    Take 60 mg by mouth daily.   DICLOFENAC SODIUM (VOLTAREN) 1 % GEL    Apply 4 g topically 4 (four) times daily.   ESOMEPRAZOLE (NEXIUM) 40 MG CAPSULE    Take 40 mg by mouth daily at 12 noon.   LEVOTHYROXINE (SYNTHROID, LEVOTHROID) 50 MCG TABLET    TAKE 1 TABLET BY MOUTH  DAILY BEFORE BREAKFAST   MULTIPLE VITAMIN (MULTIVITAMIN) TABLET    Take 1 tablet by mouth daily.  PAROXETINE (PAXIL) 30 MG TABLET    Take 1 tablet (30 mg total) by mouth every morning.   SIMVASTATIN (ZOCOR) 20 MG TABLET    Take 1 tablet (20 mg total) by mouth at bedtime.  Modified Medications   No medications on file  Discontinued Medications   No medications on file   ----------------------------------------------------------------------------------------------------------------------  Follow-up: Return in about 3 months (around 09/26/2017) for evaluation, med refill.    Molli Barrows, MD

## 2017-06-27 ENCOUNTER — Telehealth: Payer: Self-pay | Admitting: *Deleted

## 2017-06-27 NOTE — Telephone Encounter (Signed)
Information has been submitted to pts insurance for verification of benefits. Awaiting response for coverage  

## 2017-07-02 DIAGNOSIS — M545 Low back pain: Secondary | ICD-10-CM | POA: Diagnosis not present

## 2017-07-05 ENCOUNTER — Encounter: Payer: Self-pay | Admitting: Family Medicine

## 2017-07-10 NOTE — Telephone Encounter (Signed)
Fax received indicating PA required. Form placed in DGessner's inbox for review and completion of section g.

## 2017-07-23 ENCOUNTER — Emergency Department
Admission: EM | Admit: 2017-07-23 | Discharge: 2017-07-23 | Disposition: A | Discharge status: Home / Self Care | Payer: Medicare HMO | Attending: Emergency Medicine | Admitting: Emergency Medicine

## 2017-07-23 ENCOUNTER — Emergency Department: Payer: Medicare HMO

## 2017-07-23 ENCOUNTER — Encounter: Payer: Self-pay | Admitting: Emergency Medicine

## 2017-07-23 ENCOUNTER — Other Ambulatory Visit: Payer: Self-pay

## 2017-07-23 DIAGNOSIS — E039 Hypothyroidism, unspecified: Secondary | ICD-10-CM | POA: Diagnosis not present

## 2017-07-23 DIAGNOSIS — M79605 Pain in left leg: Secondary | ICD-10-CM | POA: Diagnosis not present

## 2017-07-23 DIAGNOSIS — Z96659 Presence of unspecified artificial knee joint: Secondary | ICD-10-CM | POA: Diagnosis not present

## 2017-07-23 DIAGNOSIS — R600 Localized edema: Secondary | ICD-10-CM | POA: Diagnosis not present

## 2017-07-23 DIAGNOSIS — J449 Chronic obstructive pulmonary disease, unspecified: Secondary | ICD-10-CM | POA: Insufficient documentation

## 2017-07-23 DIAGNOSIS — Z7982 Long term (current) use of aspirin: Secondary | ICD-10-CM | POA: Diagnosis not present

## 2017-07-23 DIAGNOSIS — M7989 Other specified soft tissue disorders: Secondary | ICD-10-CM | POA: Diagnosis not present

## 2017-07-23 DIAGNOSIS — R609 Edema, unspecified: Secondary | ICD-10-CM

## 2017-07-23 DIAGNOSIS — Z79899 Other long term (current) drug therapy: Secondary | ICD-10-CM | POA: Insufficient documentation

## 2017-07-23 NOTE — ED Triage Notes (Signed)
Pt reports that she that flew out of town Saturday and then back yesterday. She states that she developed left leg pain after flying. She reports that her leg is more swollen than normal. She states that it is hurting behind her left knee.

## 2017-07-23 NOTE — ED Notes (Signed)
Patient was brought over from [facility] with Left Lower extremitity pain after air travel yesterday.

## 2017-07-23 NOTE — ED Provider Notes (Signed)
[facility] Emergency Department Provider Note  ___________________________________________   First MD Initiated Contact with Patient 07/23/17 1538     (approximate)  I have reviewed the triage vital signs and the nursing notes.   HISTORY  Chief Complaint Leg Pain   HPI Dorothy Church is a 82 y.o. female who is presenting to the emergency department today with left lower extremity pain after flying to [facility] this past weekend.  She said that she had 2 flights 1 to [place] on the one to [facility].  Both about 1 hour to 2 hours.  She says that she returned yesterday.  Says that she had swelling upon returning from [place] yesterday which has since resolved.  She says that she also is having some pain with walking to the left medial ankle as well as the left medial knee.  She is a history of a knee replacement on the left side.  Said that she also has a history of a blood clot but is not currently on any treatment for this as it was multiple years ago after a knee replacement to the left knee.  However, she states that it was 2 years after the left knee replacement.  Is only on 81 mg aspirin at this time.   Past Medical History:  Diagnosis Date  . Aortic valve disorders   . Arthritis   . Barrett's esophagus   . COPD (chronic obstructive pulmonary disease) ([place])   . Hypercholesteremia   . Hypothyroidism   . Impaired fasting glucose   . Intrinsic asthma, unspecified   . Obstructive sleep apnea (adult) (pediatric)   . Osteoporosis   . Pure hypercholesterolemia   . Syncope and collapse   . TIA (transient ischemic attack)   . Vitamin D deficiency     Patient Active Problem List   Diagnosis Date Noted  . Barrett's esophagus 03/11/2017  . Depressive disorder, not elsewhere classified 03/11/2017  . Pure hypercholesterolemia 03/11/2017  . DDD (degenerative disc disease), lumbar 01/22/2017  . TIA (transient ischemic attack)  12/11/2016  . Asthma, chronic, severe persistent, uncomplicated 12/16/2534  . GOA (generalized osteoarthritis) 11/23/2015  . Joint pain of ankle and foot, right 05/17/2015  . Claudication of right lower extremity ([place]) 05/03/2015  . Generalized osteoarthritis of hand 05/03/2015  . OA (osteoarthritis) 04/04/2015  . Chronic obstructive airway disease with asthma ([place]) 11/10/2014  . History of colonic polyps 12/08/2013  . Hypersomnia with sleep apnea 04/23/2013  . Osteoporosis 10/07/2012  . Vitamin D deficiency   . Hypothyroidism   . Hyperlipidemia 08/07/2012  . Obstructive sleep apnea on CPAP 08/07/2012  . Asthma 08/07/2012  . Aortic stenosis 03/04/2012  . Impaired fasting glucose 03/04/2012    Past Surgical History:  Procedure Laterality Date  . BREAST EXCISIONAL BIOPSY Left   . COLONOSCOPY    . COLONOSCOPY WITH PROPOFOL N/A 12/14/2014   Procedure: COLONOSCOPY WITH PROPOFOL;  Surgeon: Lollie Sails, MD;  Location: [facility] ENDOSCOPY;  Service: Endoscopy;  Laterality: N/A;  . ESOPHAGOGASTRODUODENOSCOPY (EGD) WITH PROPOFOL N/A 12/14/2014   Procedure: ESOPHAGOGASTRODUODENOSCOPY (EGD) WITH PROPOFOL;  Surgeon: Lollie Sails, MD;  Location: [facility] ENDOSCOPY;  Service: Endoscopy;  Laterality: N/A;  . ESOPHAGOGASTRODUODENOSCOPY (EGD) WITH PROPOFOL N/A 10/18/2016   Procedure: ESOPHAGOGASTRODUODENOSCOPY (EGD) WITH PROPOFOL;  Surgeon: Lollie Sails, MD;  Location: [facility] ENDOSCOPY;  Service: Endoscopy;  Laterality: N/A;  . EYE SURGERY    . HIP SURGERY    . prosthesis eye implant    . REPLACEMENT UNICONDYLAR  JOINT KNEE    . TONSILLECTOMY      Prior to Admission medications   Medication Sig Start Date End Date Taking? Authorizing Provider  acetaminophen (TYLENOL) 325 MG tablet Take 650 mg by mouth every 6 (six) hours as needed.    [provider]  albuterol (VENTOLIN HFA) 108 (90 BASE) MCG/ACT inhaler Inhale 2 puffs into the lungs every 6 (six) hours as needed for wheezing.  09/02/14   Lucille Passy, MD  aspirin EC 81 MG EC tablet Take 1 tablet (81 mg total) by mouth daily. 12/13/16   Dustin Flock, MD  budesonide-formoterol [facility]) 160-4.5 MCG/ACT inhaler Inhale 2 puffs into the lungs 2 (two) times daily. 10/26/16   Flora Lipps, MD  calcium carbonate (OS-CAL) 600 MG TABS Take 600 mg by mouth daily.    [provider]  dexlansoprazole (DEXILANT) 60 MG capsule Take 60 mg by mouth daily.    [provider]  diclofenac sodium (VOLTAREN) 1 % GEL Apply 4 g topically 4 (four) times daily. 02/24/15   Copland, Frederico Hamman, MD  esomeprazole (NEXIUM) 40 MG capsule Take 40 mg by mouth daily at 12 noon.    [provider]  levothyroxine (SYNTHROID, LEVOTHROID) 50 MCG tablet TAKE 1 TABLET BY MOUTH  DAILY BEFORE BREAKFAST 05/16/17   Elby Beck, FNP  Multiple Vitamin (MULTIVITAMIN) tablet Take 1 tablet by mouth daily.    [provider]  PARoxetine (PAXIL) 30 MG tablet Take 1 tablet (30 mg total) by mouth every morning. 05/22/17   Elby Beck, FNP  simvastatin (ZOCOR) 20 MG tablet Take 1 tablet (20 mg total) by mouth at bedtime. 05/22/17   Elby Beck, FNP    Allergies Amoxicillin; Cefzil [cefprozil]; Cephalexin; Propoxyphene; Relafen [nabumetone]; and Talwin [pentazocine]  Family History  Problem Relation Age of Onset  . Heart disease Father   . Alcohol abuse Father   . Breast cancer Mother   . Bipolar disorder Brother     Social History Social History   Tobacco Use  . Smoking status: Never Smoker  . Smokeless tobacco: Never Used  Substance Use Topics  . Alcohol use: Yes    Alcohol/week: 0.6 oz    Types: 1 Glasses of wine per week    Comment: occas. wine  . Drug use: No    Review of Systems  Constitutional: No fever/chills Eyes: No visual changes. ENT: No sore throat. Cardiovascular: Denies chest pain. Respiratory: Denies shortness of breath. Gastrointestinal: No abdominal pain.  No nausea, no vomiting.   No diarrhea.  No constipation. Genitourinary: Negative for dysuria. Musculoskeletal: Negative for back pain. Skin: Negative for rash. Neurological: Negative for headaches, focal weakness or numbness.   ____________________________________________   PHYSICAL EXAM:  VITAL SIGNS: ED Triage Vitals  Enc Vitals Group     BP 07/23/17 1400 129/84     Pulse Rate 07/23/17 1400 84     Resp 07/23/17 1400 20     Temp 07/23/17 1400 97.8 F (36.6 C)     Temp Source 07/23/17 1400 Oral     SpO2 07/23/17 1400 98 %     Weight 07/23/17 1401 145 lb (65.8 kg)     Height 07/23/17 1401 5\' 6"  (1.676 m)     Head Circumference --      Peak Flow --      Pain Score 07/23/17 1400 6     Pain Loc --      Pain Edu? --      Excl. in  GC? --     Constitutional: Alert and oriented. Well appearing and in no acute distress. Eyes: Conjunctivae are normal.  Head: Atraumatic. Nose: No congestion/rhinnorhea. Mouth/Throat: Mucous membranes are moist.  Neck: No stridor.   Cardiovascular: Normal rate, regular rhythm. Grossly normal heart sounds.  Good peripheral circulation with equal and bilateral dorsalis pedis pulses. Respiratory: Normal respiratory effort.  No retractions. Lungs CTAB. Gastrointestinal: Soft and nontender. No distention. No CVA tenderness. Musculoskeletal: No lower extremity tenderness nor edema.  No joint effusions.  Full range of motion to the left ankle as well as left knee.  No ligamentous laxity.  No tenderness to palpation.  No swelling or deformity. Neurologic:  Normal speech and language. No gross focal neurologic deficits are appreciated. Skin:  Skin is warm, dry and intact. No rash noted. Psychiatric: Mood and affect are normal. Speech and behavior are normal.  ____________________________________________   LABS (all labs ordered are listed, but only abnormal results are displayed)  Labs Reviewed - No data to  display ____________________________________________  EKG   ____________________________________________  RADIOLOGY  No finding of acute DVT. ____________________________________________   PROCEDURES  Procedure(s) performed:   Procedures  Critical Care performed:   ____________________________________________   INITIAL IMPRESSION / ASSESSMENT AND PLAN / ED COURSE  Pertinent labs & imaging results that were available during my care of the patient were reviewed by me and considered in my medical decision making (see chart for details).  DDX: DVT, muscle strain, joint inflammation from overuse, septic joint, cellulitis, ligamentous injury As part of my medical decision making, I reviewed the following data within the electronic MEDICAL RECORD NUMBER Notes from prior ED visits  Patient without evidence of DVT.  However, with risk factors of recent flying.  Will continue on aspirin and will be following up with her primary care doctor in the next 3 to 5 days for possible repeat scan/ultrasound of the left lower extremity.  However, it is possible that she does have dependent edema from the flight or possible overuse injury of the joints with inflammation from what she describes walking on and uneven surface over the weekend and then staying on the second floor and having to climb more stairs than normal.  Patient I believe is safe for discharge.  Is understanding the plan willing to comply. ____________________________________________   FINAL CLINICAL IMPRESSION(S) / ED DIAGNOSES  Final diagnoses:  Left leg pain  Swelling      NEW MEDICATIONS STARTED DURING THIS VISIT:  New Prescriptions   No medications on file     Note:  This document was prepared using Dragon voice recognition software and may include unintentional dictation errors.     Orbie Pyo, MD 07/23/17 [phone]

## 2017-07-26 NOTE — Telephone Encounter (Signed)
Form given to Dr Damita Dunnings for signature as it must be signed off on by MD. States he will return after review and completion

## 2017-07-26 NOTE — Telephone Encounter (Signed)
Form completed and returned to [facility] 07/24/17.

## 2017-07-29 ENCOUNTER — Encounter: Payer: Self-pay | Admitting: Family Medicine

## 2017-07-29 ENCOUNTER — Other Ambulatory Visit: Payer: Self-pay | Admitting: Family Medicine

## 2017-07-29 DIAGNOSIS — M810 Age-related osteoporosis without current pathological fracture: Secondary | ICD-10-CM

## 2017-07-29 DIAGNOSIS — E559 Vitamin D deficiency, unspecified: Secondary | ICD-10-CM

## 2017-07-29 NOTE — Progress Notes (Signed)
Needs vitamin D level prior to Prolia injection.

## 2017-07-31 NOTE — Telephone Encounter (Signed)
Form faxed to requested party; awaiting response and copy sent to be scanned

## 2017-08-02 NOTE — Telephone Encounter (Signed)
Fax received indicating approval valid through 08/16/2018. Auth# [id number] ID# [id number]

## 2017-08-05 ENCOUNTER — Encounter: Payer: Self-pay | Admitting: Family Medicine

## 2017-08-05 ENCOUNTER — Ambulatory Visit (INDEPENDENT_AMBULATORY_CARE_PROVIDER_SITE_OTHER): Payer: Medicare HMO | Admitting: Family Medicine

## 2017-08-05 ENCOUNTER — Ambulatory Visit (INDEPENDENT_AMBULATORY_CARE_PROVIDER_SITE_OTHER)
Admission: RE | Admit: 2017-08-05 | Discharge: 2017-08-05 | Disposition: A | Discharge status: Home / Self Care | Payer: Medicare HMO | Source: Ambulatory Visit | Attending: Family Medicine | Admitting: Family Medicine

## 2017-08-05 ENCOUNTER — Encounter: Payer: Self-pay | Admitting: *Deleted

## 2017-08-05 VITALS — BP 120/64 | HR 69 | Temp 98.5°F | Ht 66.5 in | Wt 144.5 lb

## 2017-08-05 DIAGNOSIS — Z471 Aftercare following joint replacement surgery: Secondary | ICD-10-CM | POA: Diagnosis not present

## 2017-08-05 DIAGNOSIS — M25562 Pain in left knee: Secondary | ICD-10-CM | POA: Diagnosis not present

## 2017-08-05 DIAGNOSIS — Z96652 Presence of left artificial knee joint: Secondary | ICD-10-CM | POA: Diagnosis not present

## 2017-08-05 NOTE — Progress Notes (Signed)
Dr. Frederico Hamman T. Zeriyah Wain, MD, [facility] Sports Medicine [place], [address] Phone: [phone] Fax: [phone]  08/05/2017  Patient: Dorothy Church, MRN: [id number], DOB: 1935-03-08, 82 y.o.  Primary Physician:  Elby Beck, FNP   Chief Complaint  Patient presents with  . Knee Pain    Left-Knee replacement 40 years ago   Subjective:   Dorothy Church is a 82 y.o. very pleasant female patient who presents with the following:  Pleasant elderly lady who presents with some left knee pain for approximately 2 weeks.  Her history is very significant for a history of traumatic injury in 1980 with a left-sided total knee arthroplasty at the age of 67.  Since that time, she has never had a revision.  She moved to this region approximately 10 years ago, and since then she has seen Dr. Marry Guan, the total joint specialist at [facility] clinic.  Per her report, he has been following her routinely for her 82 year old knee replacement.  She does not recall having any kind of inciting event, but she has started some new exercise recently.  She has pain in the knee, and pain somewhat more medially.  She has been taking some ibuprofen as well as Tylenol and some Voltaren gel.  This may gotten a little bit better in the last day or 2.  TKA in 1980. Sees Dr. Marry Guan.   No inciting event. Started a little bit of exercise.   L knee pain, medial knee pain  Past Medical History, Surgical History, Social History, Family History, Problem List, Medications, and Allergies have been reviewed and updated if relevant.  Patient Active Problem List   Diagnosis Date Noted  . Barrett's esophagus 03/11/2017  . Depressive disorder, not elsewhere classified 03/11/2017  . Pure hypercholesterolemia 03/11/2017  . DDD (degenerative disc disease), lumbar 01/22/2017  . TIA (transient ischemic attack) 12/11/2016  . Asthma, chronic, severe persistent, uncomplicated  70/35/0093  . GOA (generalized osteoarthritis) 11/23/2015  . Joint pain of ankle and foot, right 05/17/2015  . Claudication of right lower extremity ([place]) 05/03/2015  . Generalized osteoarthritis of hand 05/03/2015  . OA (osteoarthritis) 04/04/2015  . Chronic obstructive airway disease with asthma ([place]) 11/10/2014  . History of colonic polyps 12/08/2013  . Hypersomnia with sleep apnea 04/23/2013  . Osteoporosis 10/07/2012  . Vitamin D deficiency   . Hypothyroidism   . Hyperlipidemia 08/07/2012  . Obstructive sleep apnea on CPAP 08/07/2012  . Asthma 08/07/2012  . Aortic stenosis 03/04/2012  . Impaired fasting glucose 03/04/2012    Past Medical History:  Diagnosis Date  . Aortic valve disorders   . Arthritis   . Barrett's esophagus   . COPD (chronic obstructive pulmonary disease) ([place])   . Hypercholesteremia   . Hypothyroidism   . Impaired fasting glucose   . Intrinsic asthma, unspecified   . Obstructive sleep apnea (adult) (pediatric)   . Osteoporosis   . Pure hypercholesterolemia   . Syncope and collapse   . TIA (transient ischemic attack)   . Vitamin D deficiency     Past Surgical History:  Procedure Laterality Date  . BREAST EXCISIONAL BIOPSY Left   . COLONOSCOPY    . COLONOSCOPY WITH PROPOFOL N/A 12/14/2014   Procedure: COLONOSCOPY WITH PROPOFOL;  Surgeon: Lollie Sails, MD;  Location: [facility] ENDOSCOPY;  Service: Endoscopy;  Laterality: N/A;  . ESOPHAGOGASTRODUODENOSCOPY (EGD) WITH PROPOFOL N/A 12/14/2014   Procedure: ESOPHAGOGASTRODUODENOSCOPY (EGD) WITH PROPOFOL;  Surgeon: Lollie Sails, MD;  Location: [facility] ENDOSCOPY;  Service: Endoscopy;  Laterality: N/A;  . ESOPHAGOGASTRODUODENOSCOPY (EGD) WITH PROPOFOL N/A 10/18/2016   Procedure: ESOPHAGOGASTRODUODENOSCOPY (EGD) WITH PROPOFOL;  Surgeon: Lollie Sails, MD;  Location: [facility] ENDOSCOPY;  Service: Endoscopy;  Laterality: N/A;  . EYE SURGERY    . HIP SURGERY    . prosthesis eye implant    . REPLACEMENT  UNICONDYLAR JOINT KNEE    . TONSILLECTOMY      Social History   Socioeconomic History  . Marital status: Divorced    Spouse name: Not on file  . Number of children: Not on file  . Years of education: Not on file  . Highest education level: Not on file  Occupational History  . Not on file  Social Needs  . Financial resource strain: Not on file  . Food insecurity:    Worry: Not on file    Inability: Not on file  . Transportation needs:    Medical: Not on file    Non-medical: Not on file  Tobacco Use  . Smoking status: Never Smoker  . Smokeless tobacco: Never Used  Substance and Sexual Activity  . Alcohol use: Yes    Alcohol/week: 0.6 oz    Types: 1 Glasses of wine per week    Comment: occas. wine  . Drug use: No  . Sexual activity: Never  Lifestyle  . Physical activity:    Days per week: Not on file    Minutes per session: Not on file  . Stress: Not on file  Relationships  . Social connections:    Talks on phone: Not on file    Gets together: Not on file    Attends religious service: Not on file    Active member of club or organization: Not on file    Attends meetings of clubs or organizations: Not on file    Relationship status: Not on file  . Intimate partner violence:    Fear of current or ex partner: Not on file    Emotionally abused: Not on file    Physically abused: Not on file    Forced sexual activity: Not on file  Other Topics Concern  . Not on file  Social History Narrative   Lived in [place], moved to [place] two years ago.   Divorced.      Has a living will.   Would desire CPR, would not desire extraordinary measures.          Family History  Problem Relation Age of Onset  . Heart disease Father   . Alcohol abuse Father   . Breast cancer Mother   . Bipolar disorder Brother     Allergies  Allergen Reactions  . Amoxicillin Other (See Comments)    Pencillins  . Cefzil [Cefprozil]   . Cephalexin   . Propoxyphene      Other reaction(s): Unknown  . Relafen [Nabumetone]   . Talwin [Pentazocine]     Medication list reviewed and updated in full in [place].  GEN: No fevers, chills. Nontoxic. Primarily MSK c/o today. MSK: Detailed in the HPI GI: tolerating PO intake without difficulty Neuro: No numbness, parasthesias, or tingling associated. Otherwise the pertinent positives of the ROS are noted above.   Objective:   BP 120/64   Pulse 69   Temp 98.5 F (36.9 C) (Oral)   Ht 5' 6.5" (1.689 m)   Wt 144 lb 8 oz (65.5 kg)   BMI 22.97 kg/m    GEN: WDWN,  NAD, Non-toxic, Alert & Oriented x 3 HEENT: Atraumatic, Normocephalic.  Ears and Nose: No external deformity. EXTR: No clubbing/cyanosis/edema NEURO: Normal gait.  PSYCH: Normally interactive. Conversant. Not depressed or anxious appearing.  Calm demeanor.    At the left knee, the patient lacks 5 degrees of extension.  She is able to flex the knee to approximately 75 to 80 degrees.  This is not new according to the patient.  Patient has minimal movement at the patella.  She is stable to varus and valgus stress.  There is no effusion.  She does have some mild tenderness around the medial aspect of the joint line.  None significant on the lateral aspect.  No tenderness at the patellar tendon, quad tendon, proximal fibula, proximal tibia.  Radiology: Dg Knee Complete 4 Views Left  Result Date: 08/05/2017 CLINICAL DATA:  82 year old female with remote knee replacement. New knee pain. No reported trauma. Initial encounter. EXAM: LEFT KNEE - COMPLETE 4+ VIEW COMPARISON:  None. FINDINGS: Tibial component of left knee prosthesis appears atypical with inferior displacement of the medial component. Tiny amount of joint fluid. Remote fracture distal left femoral diaphysis. IMPRESSION: Tibial component of left knee prosthesis appears atypical with inferior displacement/inferior subsiding of the medial component. Age of this is indeterminate as there are no  comparison exams. Electronically Signed   By: Genia Del M.D.   On: 08/05/2017 16:09    Assessment and Plan:   Acute pain of left knee - Plan: DG Knee Complete 4 Views Left  History of left knee replacement - Plan: DG Knee Complete 4 Views Left  I agree with radiology that the tibial component appears to be atypical, and I reviewed this face-to-face with the patient and showed her changes that are apparent on radiograph.  There also appear to be various small bone fragments also or loose bodies.  Age undetermined, but unlikely acute.  I think it is reasonable to continue taking some anti-inflammatories, Tylenol, icing, and to continue with Voltaren gel.  She has markedly abnormal movement in her knee.  She already has a follow-up appointment scheduled in 1 week's time with Dr. Marry Guan, and I think that that is entirely appropriate to get his expertise in this 82 year old knee replacement.   Follow-up: No follow-ups on file.  Orders Placed This Encounter  Procedures  . DG Knee Complete 4 Views Left    Signed,  Naureen Benton T. Kattleya Kuhnert, MD   Allergies as of 08/05/2017      Reactions   Amoxicillin Other (See Comments)   Pencillins   Cefzil [cefprozil]    Cephalexin    Propoxyphene    Other reaction(s): Unknown   Relafen [nabumetone]    Talwin [pentazocine]       Medication List        Accurate as of 08/05/17 11:59 PM. Always use your most recent med list.          acetaminophen 325 MG tablet Commonly known as:  TYLENOL Take 650 mg by mouth every 6 (six) hours as needed.   albuterol 108 (90 Base) MCG/ACT inhaler Commonly known as:  VENTOLIN HFA Inhale 2 puffs into the lungs every 6 (six) hours as needed for wheezing.   aspirin 81 MG EC tablet Take 1 tablet (81 mg total) by mouth daily.   budesonide-formoterol 160-4.5 MCG/ACT inhaler Commonly known as:  SYMBICORT Inhale 2 puffs into the lungs 2 (two) times daily.   calcium carbonate 600 MG Tabs tablet Commonly known  as:  OS-CAL Take  600 mg by mouth daily.   DEXILANT 60 MG capsule Generic drug:  dexlansoprazole Take 60 mg by mouth daily.   diclofenac sodium 1 % Gel Commonly known as:  VOLTAREN Apply 4 g topically 4 (four) times daily.   esomeprazole 40 MG capsule Commonly known as:  NEXIUM Take 40 mg by mouth daily at 12 noon.   levothyroxine 50 MCG tablet Commonly known as:  SYNTHROID, LEVOTHROID TAKE 1 TABLET BY MOUTH  DAILY BEFORE BREAKFAST   multivitamin tablet Take 1 tablet by mouth daily.   PARoxetine 30 MG tablet Commonly known as:  PAXIL Take 1 tablet (30 mg total) by mouth every morning.   simvastatin 20 MG tablet Commonly known as:  ZOCOR Take 1 tablet (20 mg total) by mouth at bedtime.

## 2017-08-06 ENCOUNTER — Encounter: Payer: Self-pay | Admitting: Family Medicine

## 2017-08-07 ENCOUNTER — Encounter: Payer: Self-pay | Admitting: Family Medicine

## 2017-08-08 NOTE — Telephone Encounter (Signed)
Verification of benefits have been processed and an approval has been received for pts prolia injection. Pts estimated cost are appx $220. This is only an estimate and cannot be confirmed until benefits are paid. Please advise pt and schedule if needed. If scheduled, once the injection is received, pls contact me back with the date it was received so that I am able to update prolia folder. thanks

## 2017-08-08 NOTE — Telephone Encounter (Signed)
Spoke to pt and advised. Pt agreeable; Ca lab and prolia scheduled.

## 2017-08-09 ENCOUNTER — Other Ambulatory Visit: Payer: Self-pay | Admitting: Family Medicine

## 2017-08-09 DIAGNOSIS — M810 Age-related osteoporosis without current pathological fracture: Secondary | ICD-10-CM

## 2017-08-09 DIAGNOSIS — E559 Vitamin D deficiency, unspecified: Secondary | ICD-10-CM

## 2017-08-11 ENCOUNTER — Encounter: Payer: Self-pay | Admitting: Family Medicine

## 2017-08-12 ENCOUNTER — Other Ambulatory Visit (INDEPENDENT_AMBULATORY_CARE_PROVIDER_SITE_OTHER): Payer: Medicare HMO

## 2017-08-12 DIAGNOSIS — M810 Age-related osteoporosis without current pathological fracture: Secondary | ICD-10-CM

## 2017-08-12 DIAGNOSIS — E559 Vitamin D deficiency, unspecified: Secondary | ICD-10-CM

## 2017-08-12 LAB — CALCIUM: CALCIUM: 9 mg/dL (ref 8.4–10.5)

## 2017-08-12 LAB — VITAMIN D 25 HYDROXY (VIT D DEFICIENCY, FRACTURES): VITD: 22.23 ng/mL — AB (ref 30.00–100.00)

## 2017-08-12 MED ORDER — VITAMIN D (ERGOCALCIFEROL) 1.25 MG (50000 UNIT) PO CAPS: 50000.0000 [IU] | ORAL_CAPSULE | ORAL | 1 refills | Status: DC

## 2017-08-12 NOTE — Addendum Note (Signed)
Addended by: Clarene Reamer B on: 08/12/2017 01:33 PM   Modules accepted: Orders

## 2017-08-13 ENCOUNTER — Encounter: Payer: Self-pay | Admitting: Family Medicine

## 2017-08-13 DIAGNOSIS — S82141A Displaced bicondylar fracture of right tibia, initial encounter for closed fracture: Secondary | ICD-10-CM | POA: Diagnosis not present

## 2017-08-13 DIAGNOSIS — M25562 Pain in left knee: Secondary | ICD-10-CM | POA: Diagnosis not present

## 2017-08-16 ENCOUNTER — Encounter: Payer: Self-pay | Admitting: Family Medicine

## 2017-08-20 ENCOUNTER — Ambulatory Visit (INDEPENDENT_AMBULATORY_CARE_PROVIDER_SITE_OTHER): Payer: Medicare HMO | Admitting: *Deleted

## 2017-08-20 DIAGNOSIS — M810 Age-related osteoporosis without current pathological fracture: Secondary | ICD-10-CM

## 2017-08-20 DIAGNOSIS — Z96659 Presence of unspecified artificial knee joint: Secondary | ICD-10-CM | POA: Diagnosis not present

## 2017-08-20 DIAGNOSIS — T84098A Other mechanical complication of other internal joint prosthesis, initial encounter: Secondary | ICD-10-CM | POA: Diagnosis not present

## 2017-08-20 MED ORDER — DENOSUMAB 60 MG/ML ~~LOC~~ SOSY
60.0000 mg | PREFILLED_SYRINGE | Freq: Once | SUBCUTANEOUS | Status: AC
  Administered 2017-08-20: 60 mg via SUBCUTANEOUS

## 2017-08-20 NOTE — Progress Notes (Signed)
Per orders of Dr. Damita Dunnings Dorothy Church's patient) injection of Prolia 60mg /ml given by Ruqaya Strauss. Patient tolerated injection well.

## 2017-08-25 DIAGNOSIS — T84098A Other mechanical complication of other internal joint prosthesis, initial encounter: Secondary | ICD-10-CM

## 2017-08-25 DIAGNOSIS — Z96659 Presence of unspecified artificial knee joint: Secondary | ICD-10-CM | POA: Insufficient documentation

## 2017-08-25 HISTORY — DX: Presence of unspecified artificial knee joint: T84.098A

## 2017-08-26 ENCOUNTER — Encounter: Payer: Self-pay | Admitting: Family Medicine

## 2017-08-30 ENCOUNTER — Other Ambulatory Visit: Payer: Self-pay

## 2017-08-30 ENCOUNTER — Encounter
Admission: RE | Admit: 2017-08-30 | Discharge: 2017-08-30 | Disposition: A | Discharge status: Home / Self Care | Payer: Medicare HMO | Source: Ambulatory Visit | Attending: Orthopedic Surgery | Admitting: Orthopedic Surgery

## 2017-08-30 DIAGNOSIS — G4733 Obstructive sleep apnea (adult) (pediatric): Secondary | ICD-10-CM | POA: Diagnosis not present

## 2017-08-30 DIAGNOSIS — K219 Gastro-esophageal reflux disease without esophagitis: Secondary | ICD-10-CM | POA: Insufficient documentation

## 2017-08-30 DIAGNOSIS — E7800 Pure hypercholesterolemia, unspecified: Secondary | ICD-10-CM | POA: Insufficient documentation

## 2017-08-30 DIAGNOSIS — Z79890 Hormone replacement therapy (postmenopausal): Secondary | ICD-10-CM | POA: Diagnosis not present

## 2017-08-30 DIAGNOSIS — I4510 Unspecified right bundle-branch block: Secondary | ICD-10-CM | POA: Insufficient documentation

## 2017-08-30 DIAGNOSIS — Z01818 Encounter for other preprocedural examination: Secondary | ICD-10-CM | POA: Insufficient documentation

## 2017-08-30 DIAGNOSIS — T84093A Other mechanical complication of internal left knee prosthesis, initial encounter: Secondary | ICD-10-CM | POA: Diagnosis not present

## 2017-08-30 DIAGNOSIS — R7301 Impaired fasting glucose: Secondary | ICD-10-CM | POA: Insufficient documentation

## 2017-08-30 DIAGNOSIS — Y838 Other surgical procedures as the cause of abnormal reaction of the patient, or of later complication, without mention of misadventure at the time of the procedure: Secondary | ICD-10-CM | POA: Insufficient documentation

## 2017-08-30 DIAGNOSIS — E039 Hypothyroidism, unspecified: Secondary | ICD-10-CM | POA: Diagnosis not present

## 2017-08-30 DIAGNOSIS — Z7982 Long term (current) use of aspirin: Secondary | ICD-10-CM | POA: Diagnosis not present

## 2017-08-30 DIAGNOSIS — Z79899 Other long term (current) drug therapy: Secondary | ICD-10-CM | POA: Insufficient documentation

## 2017-08-30 DIAGNOSIS — J449 Chronic obstructive pulmonary disease, unspecified: Secondary | ICD-10-CM | POA: Insufficient documentation

## 2017-08-30 HISTORY — DX: Gastro-esophageal reflux disease without esophagitis: K21.9

## 2017-08-30 HISTORY — DX: Personal history of other diseases of the digestive system: Z87.19

## 2017-08-30 HISTORY — DX: Pneumonia, unspecified organism: J18.9

## 2017-08-30 LAB — URINALYSIS, ROUTINE W REFLEX MICROSCOPIC
Bilirubin Urine: NEGATIVE
Glucose, UA: NEGATIVE mg/dL
Hgb urine dipstick: NEGATIVE
Ketones, ur: NEGATIVE mg/dL
LEUKOCYTES UA: NEGATIVE
NITRITE: NEGATIVE
Protein, ur: NEGATIVE mg/dL
SPECIFIC GRAVITY, URINE: 1.023 (ref 1.005–1.030)
pH: 6 (ref 5.0–8.0)

## 2017-08-30 LAB — COMPREHENSIVE METABOLIC PANEL
ALBUMIN: 4.3 g/dL (ref 3.5–5.0)
ALK PHOS: 46 U/L (ref 38–126)
ALT: 15 U/L (ref 0–44)
ANION GAP: 6 (ref 5–15)
AST: 16 U/L (ref 15–41)
BUN: 17 mg/dL (ref 8–23)
CO2: 31 mmol/L (ref 22–32)
Calcium: 8.9 mg/dL (ref 8.9–10.3)
Chloride: 105 mmol/L (ref 98–111)
Creatinine, Ser: 0.66 mg/dL (ref 0.44–1.00)
GFR calc Af Amer: >60 mL/min (ref >60)
GFR calc non Af Amer: >60 mL/min (ref >60)
GLUCOSE: 85 mg/dL (ref 70–99)
POTASSIUM: 4.1 mmol/L (ref 3.5–5.1)
Sodium: 142 mmol/L (ref 135–145)
Total Bilirubin: 0.6 mg/dL (ref 0.3–1.2)
Total Protein: 6.8 g/dL (ref 6.5–8.1)

## 2017-08-30 LAB — C-REACTIVE PROTEIN: CRP: 0.8 mg/dL (ref <1.0)

## 2017-08-30 LAB — CBC
HCT: 41.2 % (ref 35.0–47.0)
HEMOGLOBIN: 14.1 g/dL (ref 12.0–16.0)
MCH: 31.2 pg (ref 26.0–34.0)
MCHC: 34.2 g/dL (ref 32.0–36.0)
MCV: 91.2 fL (ref 80.0–100.0)
PLATELETS: 221 10*3/uL (ref 150–440)
RBC: 4.52 MIL/uL (ref 3.80–5.20)
RDW: 13.4 % (ref 11.5–14.5)
WBC: 7.1 10*3/uL (ref 3.6–11.0)

## 2017-08-30 LAB — APTT: APTT: 29 s (ref 24–36)

## 2017-08-30 LAB — TYPE AND SCREEN
ABO/RH(D): A POS
ANTIBODY SCREEN: NEGATIVE

## 2017-08-30 LAB — PROTIME-INR
INR: 1
Prothrombin Time: 13.1 seconds (ref 11.4–15.2)

## 2017-08-30 LAB — SURGICAL PCR SCREEN
MRSA, PCR: NEGATIVE
STAPHYLOCOCCUS AUREUS: NEGATIVE

## 2017-08-30 LAB — SEDIMENTATION RATE: Sed Rate: 6 mm/hr (ref 0–30)

## 2017-08-30 NOTE — Patient Instructions (Signed)
  Your procedure is scheduled on: Friday September 13, 2017 Report to Same Day Surgery 2nd floor medical mall ([place] Entrance-take elevator on left to 2nd floor.  Check in with surgery information desk.) To find out your arrival time please call [phone] between 1PM - 3PM on Thursday September 12, 2017  Remember: Instructions that are not followed completely may result in serious medical risk, up to and including death, or upon the discretion of your surgeon and anesthesiologist your surgery may need to be rescheduled.    _x___ 1. Do not eat food, including mints, candies, chewing gum after midnight the night before your procedure. You may drink clear liquids up to 2 hours before you are scheduled to arrive at the hospital for your procedure.  Do not drink clear liquids within 2 hours of your scheduled arrival to the hospital.  Clear liquids include  --Water or Apple juice without pulp  --Clear carbohydrate beverage such as Gatorade  --Black Coffee or Clear Tea (No milk, no creamers, do not add anything to the coffee or tea      __x__ 2. No Alcohol for 24 hours before or after surgery.   __x__3. No Smoking or e-cigarettes for 24 prior to surgery.  Do not use any chewable tobacco products for at least 6 hour prior to surgery   ____  4. Bring all medications with you on the day of surgery if instructed.    __x__ 5. Notify your doctor if there is any change in your medical condition     (cold, fever, infections).   __x__6. On the morning of surgery brush your teeth with toothpaste and water.  You may rinse your mouth with mouth wash if you wish.  Do not swallow any toothpaste or mouthwash.   Do not wear jewelry, make-up, hairpins, clips or nail polish.  Do not wear lotions, powders, deodorant, or perfumes.   Do not shave 48 hours prior to surgery.   Do not bring valuables to the hospital.    [facility] is not responsible for any belongings or valuables.               Contacts,  dentures or bridgework may not be worn into surgery.  Leave your suitcase in the car. After surgery it may be brought to your room.  For patients admitted to the hospital, discharge time is determined by your treatment team.  Please read over the following fact sheets that you were given:   [facility] Preparing for Surgery and or MRSA Information   _x___ Take anti-hypertensive listed below, cardiac, seizure, asthma, anti-reflux and psychiatric medicines. These include:  1. Esomeprazole/Nexium  2. Levothyroxine/Synthroid  3. Paroxetine/Paxil  _x___ Use CHG Soap or sage wipes as directed on instruction sheet   _x___ Use inhalers on the day of surgery and bring to hospital day of surgery  _x___ Follow recommendations from Cardiologist, Pulmonologist or PCP regarding stopping Aspirin, Coumadin, Plavix ,Eliquis, Effient, or Pradaxa, and Pletal.  _x___ Stop Anti-inflammatories such as Advil, Aleve, Ibuprofen, Motrin, Naproxen, Naprosyn, Goodies powders or aspirin products 7 days prior to surgery, Friday September 06, 2017. OK to take Tylenol and Celebrex.   _x___ NOW: Stop supplements (Zicam, Culturelle) until after surgery.  But may continue Vitamin D, Vitamin B, and multivitamin.  _x___ Bring C-Pap to the hospital.

## 2017-08-31 LAB — URINE CULTURE
Culture: NO GROWTH
Special Requests: NORMAL

## 2017-09-02 DIAGNOSIS — H40001 Preglaucoma, unspecified, right eye: Secondary | ICD-10-CM | POA: Diagnosis not present

## 2017-09-02 LAB — IGE: IGE (IMMUNOGLOBULIN E), SERUM: 48 [IU]/mL (ref 6–495)

## 2017-09-12 MED ORDER — CLINDAMYCIN PHOSPHATE 900 MG/50ML IV SOLN
900.0000 mg | INTRAVENOUS | Status: AC
  Administered 2017-09-13 (×2): 900 mg via INTRAVENOUS

## 2017-09-12 MED ORDER — SODIUM CHLORIDE 0.9 % IV SOLN
1000.0000 mg | INTRAVENOUS | Status: AC
  Administered 2017-09-13: 1000 mg via INTRAVENOUS
  Filled 2017-09-12: qty 1100

## 2017-09-13 ENCOUNTER — Inpatient Hospital Stay: Payer: Medicare HMO

## 2017-09-13 ENCOUNTER — Inpatient Hospital Stay
Admission: RE | Admit: 2017-09-13 | Discharge: 2017-09-17 | DRG: 468 | Disposition: A | Discharge status: Skilled Nursing Facility | Payer: Medicare HMO | Source: Ambulatory Visit | Attending: Orthopedic Surgery | Admitting: Orthopedic Surgery

## 2017-09-13 ENCOUNTER — Other Ambulatory Visit: Payer: Self-pay

## 2017-09-13 ENCOUNTER — Encounter
Admission: RE | Disposition: A | Discharge status: Skilled Nursing Facility | Payer: Self-pay | Source: Ambulatory Visit | Attending: Orthopedic Surgery

## 2017-09-13 ENCOUNTER — Encounter: Payer: Self-pay | Admitting: Orthopedic Surgery

## 2017-09-13 DIAGNOSIS — Z7401 Bed confinement status: Secondary | ICD-10-CM | POA: Diagnosis not present

## 2017-09-13 DIAGNOSIS — M6281 Muscle weakness (generalized): Secondary | ICD-10-CM | POA: Diagnosis not present

## 2017-09-13 DIAGNOSIS — R2681 Unsteadiness on feet: Secondary | ICD-10-CM | POA: Diagnosis not present

## 2017-09-13 DIAGNOSIS — R278 Other lack of coordination: Secondary | ICD-10-CM | POA: Diagnosis not present

## 2017-09-13 DIAGNOSIS — M810 Age-related osteoporosis without current pathological fracture: Secondary | ICD-10-CM | POA: Diagnosis present

## 2017-09-13 DIAGNOSIS — I959 Hypotension, unspecified: Secondary | ICD-10-CM | POA: Diagnosis not present

## 2017-09-13 DIAGNOSIS — T84093A Other mechanical complication of internal left knee prosthesis, initial encounter: Secondary | ICD-10-CM | POA: Diagnosis not present

## 2017-09-13 DIAGNOSIS — Z96659 Presence of unspecified artificial knee joint: Secondary | ICD-10-CM

## 2017-09-13 DIAGNOSIS — Z96652 Presence of left artificial knee joint: Secondary | ICD-10-CM | POA: Diagnosis not present

## 2017-09-13 DIAGNOSIS — E785 Hyperlipidemia, unspecified: Secondary | ICD-10-CM | POA: Diagnosis not present

## 2017-09-13 DIAGNOSIS — M1990 Unspecified osteoarthritis, unspecified site: Secondary | ICD-10-CM | POA: Diagnosis not present

## 2017-09-13 DIAGNOSIS — Z8673 Personal history of transient ischemic attack (TIA), and cerebral infarction without residual deficits: Secondary | ICD-10-CM | POA: Diagnosis not present

## 2017-09-13 DIAGNOSIS — G4733 Obstructive sleep apnea (adult) (pediatric): Secondary | ICD-10-CM | POA: Diagnosis present

## 2017-09-13 DIAGNOSIS — E559 Vitamin D deficiency, unspecified: Secondary | ICD-10-CM | POA: Diagnosis present

## 2017-09-13 DIAGNOSIS — K449 Diaphragmatic hernia without obstruction or gangrene: Secondary | ICD-10-CM | POA: Diagnosis present

## 2017-09-13 DIAGNOSIS — G4700 Insomnia, unspecified: Secondary | ICD-10-CM | POA: Diagnosis not present

## 2017-09-13 DIAGNOSIS — I359 Nonrheumatic aortic valve disorder, unspecified: Secondary | ICD-10-CM | POA: Diagnosis present

## 2017-09-13 DIAGNOSIS — T84063A Wear of articular bearing surface of internal prosthetic left knee joint, initial encounter: Secondary | ICD-10-CM | POA: Diagnosis not present

## 2017-09-13 DIAGNOSIS — Z419 Encounter for procedure for purposes other than remedying health state, unspecified: Secondary | ICD-10-CM

## 2017-09-13 DIAGNOSIS — R262 Difficulty in walking, not elsewhere classified: Secondary | ICD-10-CM | POA: Diagnosis not present

## 2017-09-13 DIAGNOSIS — E039 Hypothyroidism, unspecified: Secondary | ICD-10-CM | POA: Diagnosis present

## 2017-09-13 DIAGNOSIS — M1991 Primary osteoarthritis, unspecified site: Secondary | ICD-10-CM | POA: Diagnosis not present

## 2017-09-13 DIAGNOSIS — R69 Illness, unspecified: Secondary | ICD-10-CM | POA: Diagnosis not present

## 2017-09-13 DIAGNOSIS — Z471 Aftercare following joint replacement surgery: Secondary | ICD-10-CM | POA: Diagnosis not present

## 2017-09-13 DIAGNOSIS — E7800 Pure hypercholesterolemia, unspecified: Secondary | ICD-10-CM | POA: Diagnosis present

## 2017-09-13 DIAGNOSIS — R7301 Impaired fasting glucose: Secondary | ICD-10-CM | POA: Diagnosis present

## 2017-09-13 DIAGNOSIS — Z741 Need for assistance with personal care: Secondary | ICD-10-CM | POA: Diagnosis not present

## 2017-09-13 DIAGNOSIS — K2270 Barrett's esophagus without dysplasia: Secondary | ICD-10-CM | POA: Diagnosis present

## 2017-09-13 DIAGNOSIS — F39 Unspecified mood [affective] disorder: Secondary | ICD-10-CM | POA: Diagnosis not present

## 2017-09-13 DIAGNOSIS — K219 Gastro-esophageal reflux disease without esophagitis: Secondary | ICD-10-CM | POA: Diagnosis present

## 2017-09-13 DIAGNOSIS — J449 Chronic obstructive pulmonary disease, unspecified: Secondary | ICD-10-CM | POA: Diagnosis not present

## 2017-09-13 DIAGNOSIS — Y838 Other surgical procedures as the cause of abnormal reaction of the patient, or of later complication, without mention of misadventure at the time of the procedure: Secondary | ICD-10-CM | POA: Diagnosis present

## 2017-09-13 DIAGNOSIS — M172 Bilateral post-traumatic osteoarthritis of knee: Secondary | ICD-10-CM | POA: Diagnosis not present

## 2017-09-13 DIAGNOSIS — F419 Anxiety disorder, unspecified: Secondary | ICD-10-CM | POA: Diagnosis not present

## 2017-09-13 HISTORY — PX: TOTAL KNEE REVISION: SHX996

## 2017-09-13 HISTORY — DX: Presence of left artificial knee joint: Z96.652

## 2017-09-13 LAB — ABO/RH: ABO/RH(D): A POS

## 2017-09-13 SURGERY — TOTAL KNEE REVISION
Anesthesia: Spinal | Site: Knee | Laterality: Left | Wound class: Clean

## 2017-09-13 MED ORDER — FENTANYL CITRATE (PF) 100 MCG/2ML IJ SOLN
INTRAMUSCULAR | Status: AC
  Filled 2017-09-13: qty 2

## 2017-09-13 MED ORDER — OXYCODONE HCL 5 MG PO TABS
10.0000 mg | ORAL_TABLET | ORAL | Status: DC | PRN
  Administered 2017-09-15 (×3): 10 mg via ORAL
  Filled 2017-09-13 (×4): qty 2

## 2017-09-13 MED ORDER — METOCLOPRAMIDE HCL 5 MG/ML IJ SOLN: 5.0000 mg | Freq: Three times a day (TID) | INTRAMUSCULAR | Status: DC | PRN

## 2017-09-13 MED ORDER — BUPIVACAINE LIPOSOME 1.3 % IJ SUSP
INTRAMUSCULAR | Status: AC
  Filled 2017-09-13: qty 20

## 2017-09-13 MED ORDER — LIDOCAINE HCL (PF) 2 % IJ SOLN
INTRAMUSCULAR | Status: DC | PRN
  Administered 2017-09-13: 60 mg

## 2017-09-13 MED ORDER — CLINDAMYCIN PHOSPHATE 900 MG/50ML IV SOLN
INTRAVENOUS | Status: AC
  Filled 2017-09-13: qty 50

## 2017-09-13 MED ORDER — MIDAZOLAM HCL 2 MG/2ML IJ SOLN
INTRAMUSCULAR | Status: AC
  Filled 2017-09-13: qty 2

## 2017-09-13 MED ORDER — MAGNESIUM HYDROXIDE 400 MG/5ML PO SUSP
30.0000 mL | Freq: Every day | ORAL | Status: DC
  Administered 2017-09-14 – 2017-09-15 (×2): 30 mL via ORAL
  Filled 2017-09-13 (×2): qty 30

## 2017-09-13 MED ORDER — TETRACAINE HCL 1 % IJ SOLN
INTRAMUSCULAR | Status: AC
  Filled 2017-09-13: qty 2

## 2017-09-13 MED ORDER — PROPOFOL 500 MG/50ML IV EMUL
INTRAVENOUS | Status: AC
  Filled 2017-09-13: qty 50

## 2017-09-13 MED ORDER — OXYCODONE HCL 5 MG PO TABS
5.0000 mg | ORAL_TABLET | ORAL | Status: DC | PRN
  Administered 2017-09-13 – 2017-09-15 (×4): 5 mg via ORAL
  Filled 2017-09-13 (×4): qty 1

## 2017-09-13 MED ORDER — PHENOL 1.4 % MT LIQD
1.0000 | OROMUCOSAL | Status: DC | PRN
  Filled 2017-09-13: qty 177

## 2017-09-13 MED ORDER — OXYMETAZOLINE HCL 0.05 % NA SOLN
NASAL | Status: DC | PRN
  Administered 2017-09-13: 1 via NASAL

## 2017-09-13 MED ORDER — ALBUTEROL SULFATE HFA 108 (90 BASE) MCG/ACT IN AERS
INHALATION_SPRAY | RESPIRATORY_TRACT | Status: AC
  Filled 2017-09-13: qty 6.7

## 2017-09-13 MED ORDER — CLINDAMYCIN PHOSPHATE 600 MG/50ML IV SOLN
600.0000 mg | Freq: Four times a day (QID) | INTRAVENOUS | Status: AC
  Administered 2017-09-13 – 2017-09-14 (×4): 600 mg via INTRAVENOUS
  Filled 2017-09-13 (×4): qty 50

## 2017-09-13 MED ORDER — PHENYLEPHRINE HCL 10 MG/ML IJ SOLN
INTRAMUSCULAR | Status: DC | PRN
  Administered 2017-09-13: 100 ug via INTRAVENOUS
  Administered 2017-09-13: 50 ug via INTRAVENOUS
  Administered 2017-09-13 (×4): 100 ug via INTRAVENOUS

## 2017-09-13 MED ORDER — MOMETASONE FURO-FORMOTEROL FUM 200-5 MCG/ACT IN AERO
2.0000 | INHALATION_SPRAY | Freq: Two times a day (BID) | RESPIRATORY_TRACT | Status: DC
  Administered 2017-09-13 – 2017-09-17 (×8): 2 via RESPIRATORY_TRACT
  Filled 2017-09-13: qty 8.8

## 2017-09-13 MED ORDER — BUPIVACAINE HCL (PF) 0.25 % IJ SOLN
INTRAMUSCULAR | Status: DC | PRN
  Administered 2017-09-13: 60 mL

## 2017-09-13 MED ORDER — GLYCOPYRROLATE 0.2 MG/ML IJ SOLN
INTRAMUSCULAR | Status: DC | PRN
  Administered 2017-09-13 (×2): 0.1 mg via INTRAVENOUS

## 2017-09-13 MED ORDER — POLYVINYL ALCOHOL 1.4 % OP SOLN
2.0000 [drp] | Freq: Two times a day (BID) | OPHTHALMIC | Status: DC | PRN
  Filled 2017-09-13: qty 15

## 2017-09-13 MED ORDER — DEXAMETHASONE SODIUM PHOSPHATE 10 MG/ML IJ SOLN
INTRAMUSCULAR | Status: AC
  Filled 2017-09-13: qty 1

## 2017-09-13 MED ORDER — KETAMINE HCL 50 MG/ML IJ SOLN
INTRAMUSCULAR | Status: AC
  Filled 2017-09-13: qty 10

## 2017-09-13 MED ORDER — SODIUM CHLORIDE 0.9 % IV BOLUS
500.0000 mL | Freq: Once | INTRAVENOUS | Status: AC
  Administered 2017-09-13: 500 mL via INTRAVENOUS

## 2017-09-13 MED ORDER — BUPIVACAINE HCL (PF) 0.25 % IJ SOLN
INTRAMUSCULAR | Status: AC
  Filled 2017-09-13: qty 60

## 2017-09-13 MED ORDER — MENTHOL 3 MG MT LOZG
1.0000 | LOZENGE | OROMUCOSAL | Status: DC | PRN
  Filled 2017-09-13: qty 9

## 2017-09-13 MED ORDER — MEPERIDINE HCL 50 MG/ML IJ SOLN: 6.2500 mg | INTRAMUSCULAR | Status: DC | PRN

## 2017-09-13 MED ORDER — BUPIVACAINE HCL (PF) 0.5 % IJ SOLN
INTRAMUSCULAR | Status: AC
  Filled 2017-09-13: qty 10

## 2017-09-13 MED ORDER — ENOXAPARIN SODIUM 30 MG/0.3ML ~~LOC~~ SOLN
30.0000 mg | Freq: Two times a day (BID) | SUBCUTANEOUS | Status: DC
  Administered 2017-09-14 – 2017-09-17 (×7): 30 mg via SUBCUTANEOUS
  Filled 2017-09-13 (×7): qty 0.3

## 2017-09-13 MED ORDER — ONDANSETRON HCL 4 MG/2ML IJ SOLN
4.0000 mg | Freq: Four times a day (QID) | INTRAMUSCULAR | Status: DC | PRN
Start: 2017-09-13 — End: 2017-09-17
  Administered 2017-09-15: 4 mg via INTRAVENOUS
  Filled 2017-09-13: qty 2

## 2017-09-13 MED ORDER — TETRACAINE HCL 1 % IJ SOLN
INTRAMUSCULAR | Status: DC | PRN
  Administered 2017-09-13: 5 mg via INTRASPINAL

## 2017-09-13 MED ORDER — KETAMINE HCL 50 MG/ML IJ SOLN
INTRAMUSCULAR | Status: DC | PRN
  Administered 2017-09-13 (×2): 25 mg via INTRAMUSCULAR

## 2017-09-13 MED ORDER — SODIUM CHLORIDE FLUSH 0.9 % IV SOLN
INTRAVENOUS | Status: AC
  Filled 2017-09-13: qty 40

## 2017-09-13 MED ORDER — SIMVASTATIN 20 MG PO TABS
20.0000 mg | ORAL_TABLET | Freq: Every day | ORAL | Status: DC
  Administered 2017-09-13 – 2017-09-16 (×4): 20 mg via ORAL
  Filled 2017-09-13 (×4): qty 1

## 2017-09-13 MED ORDER — OXYMETAZOLINE HCL 0.05 % NA SOLN
NASAL | Status: AC
  Filled 2017-09-13: qty 15

## 2017-09-13 MED ORDER — CELECOXIB 200 MG PO CAPS
ORAL_CAPSULE | ORAL | Status: AC
  Filled 2017-09-13: qty 2

## 2017-09-13 MED ORDER — TRANEXAMIC ACID 1000 MG/10ML IV SOLN
1000.0000 mg | Freq: Once | INTRAVENOUS | Status: AC
  Administered 2017-09-13: 1000 mg via INTRAVENOUS
  Filled 2017-09-13: qty 1100

## 2017-09-13 MED ORDER — FENTANYL CITRATE (PF) 100 MCG/2ML IJ SOLN: 25.0000 ug | INTRAMUSCULAR | Status: DC | PRN

## 2017-09-13 MED ORDER — LIDOCAINE HCL URETHRAL/MUCOSAL 2 % EX GEL
CUTANEOUS | Status: DC | PRN
  Administered 2017-09-13: 1 via TOPICAL

## 2017-09-13 MED ORDER — PHENYLEPHRINE HCL 10 MG/ML IJ SOLN
INTRAMUSCULAR | Status: DC | PRN
  Administered 2017-09-13: 50 ug/min via INTRAVENOUS

## 2017-09-13 MED ORDER — SODIUM CHLORIDE 0.9 % IV SOLN
INTRAVENOUS | Status: DC
  Administered 2017-09-13 – 2017-09-15 (×3): via INTRAVENOUS
  Administered 2017-09-16: 100 mL/h via INTRAVENOUS

## 2017-09-13 MED ORDER — DEXAMETHASONE SODIUM PHOSPHATE 10 MG/ML IJ SOLN
8.0000 mg | Freq: Once | INTRAMUSCULAR | Status: AC
  Administered 2017-09-13: 8 mg via INTRAVENOUS

## 2017-09-13 MED ORDER — ALBUTEROL SULFATE HFA 108 (90 BASE) MCG/ACT IN AERS
INHALATION_SPRAY | RESPIRATORY_TRACT | Status: DC | PRN
  Administered 2017-09-13 (×2): 4 via RESPIRATORY_TRACT

## 2017-09-13 MED ORDER — ACETAMINOPHEN 10 MG/ML IV SOLN
INTRAVENOUS | Status: AC
  Filled 2017-09-13: qty 100

## 2017-09-13 MED ORDER — NEOMYCIN-POLYMYXIN B GU 40-200000 IR SOLN
Status: DC | PRN
  Administered 2017-09-13: 14 mL

## 2017-09-13 MED ORDER — ACETAMINOPHEN 325 MG PO TABS
325.0000 mg | ORAL_TABLET | Freq: Four times a day (QID) | ORAL | Status: DC | PRN
  Administered 2017-09-17: 650 mg via ORAL
  Filled 2017-09-13: qty 2

## 2017-09-13 MED ORDER — DIPHENHYDRAMINE HCL 12.5 MG/5ML PO ELIX: 12.5000 mg | ORAL_SOLUTION | ORAL | Status: DC | PRN

## 2017-09-13 MED ORDER — METOCLOPRAMIDE HCL 10 MG PO TABS
10.0000 mg | ORAL_TABLET | Freq: Three times a day (TID) | ORAL | Status: AC
  Administered 2017-09-13 – 2017-09-15 (×8): 10 mg via ORAL
  Filled 2017-09-13 (×8): qty 1

## 2017-09-13 MED ORDER — ALBUMIN HUMAN 5 % IV SOLN
INTRAVENOUS | Status: AC
  Filled 2017-09-13: qty 250

## 2017-09-13 MED ORDER — FLUTICASONE PROPIONATE 50 MCG/ACT NA SUSP
2.0000 | Freq: Every day | NASAL | Status: DC | PRN
  Filled 2017-09-13: qty 16

## 2017-09-13 MED ORDER — METOCLOPRAMIDE HCL 10 MG PO TABS: 5.0000 mg | ORAL_TABLET | Freq: Three times a day (TID) | ORAL | Status: DC | PRN

## 2017-09-13 MED ORDER — ACETAMINOPHEN 10 MG/ML IV SOLN
1000.0000 mg | Freq: Four times a day (QID) | INTRAVENOUS | Status: AC
  Administered 2017-09-13 – 2017-09-14 (×3): 1000 mg via INTRAVENOUS
  Filled 2017-09-13 (×4): qty 100

## 2017-09-13 MED ORDER — CHLORHEXIDINE GLUCONATE 4 % EX LIQD
60.0000 mL | Freq: Once | CUTANEOUS | Status: AC
  Administered 2017-09-13: 4 via TOPICAL

## 2017-09-13 MED ORDER — ACETAMINOPHEN 10 MG/ML IV SOLN
INTRAVENOUS | Status: DC | PRN
  Administered 2017-09-13: 1000 mg via INTRAVENOUS

## 2017-09-13 MED ORDER — RISAQUAD PO CAPS
ORAL_CAPSULE | Freq: Every day | ORAL | Status: DC
  Administered 2017-09-14 – 2017-09-16 (×3): 1 via ORAL
  Filled 2017-09-13 (×4): qty 1

## 2017-09-13 MED ORDER — LIDOCAINE HCL URETHRAL/MUCOSAL 2 % EX GEL
CUTANEOUS | Status: AC
  Filled 2017-09-13: qty 5

## 2017-09-13 MED ORDER — BISACODYL 10 MG RE SUPP
10.0000 mg | Freq: Every day | RECTAL | Status: DC | PRN
  Administered 2017-09-15: 10 mg via RECTAL
  Filled 2017-09-13: qty 1

## 2017-09-13 MED ORDER — FERROUS SULFATE 325 (65 FE) MG PO TABS
325.0000 mg | ORAL_TABLET | Freq: Two times a day (BID) | ORAL | Status: DC
  Administered 2017-09-14 – 2017-09-17 (×6): 325 mg via ORAL
  Filled 2017-09-13 (×7): qty 1

## 2017-09-13 MED ORDER — FENTANYL CITRATE (PF) 250 MCG/5ML IJ SOLN
INTRAMUSCULAR | Status: AC
  Filled 2017-09-13: qty 5

## 2017-09-13 MED ORDER — ONDANSETRON HCL 4 MG/2ML IJ SOLN
INTRAMUSCULAR | Status: AC
  Filled 2017-09-13: qty 2

## 2017-09-13 MED ORDER — TRAMADOL HCL 50 MG PO TABS
50.0000 mg | ORAL_TABLET | ORAL | Status: DC | PRN
  Administered 2017-09-14 (×3): 100 mg via ORAL
  Administered 2017-09-15: 50 mg via ORAL
  Administered 2017-09-15: 100 mg via ORAL
  Filled 2017-09-13 (×2): qty 1
  Filled 2017-09-13 (×4): qty 2

## 2017-09-13 MED ORDER — ALUM & MAG HYDROXIDE-SIMETH 200-200-20 MG/5ML PO SUSP: 30.0000 mL | ORAL | Status: DC | PRN

## 2017-09-13 MED ORDER — SENNOSIDES-DOCUSATE SODIUM 8.6-50 MG PO TABS
1.0000 | ORAL_TABLET | Freq: Two times a day (BID) | ORAL | Status: DC
  Administered 2017-09-13 – 2017-09-16 (×7): 1 via ORAL
  Filled 2017-09-13 (×7): qty 1

## 2017-09-13 MED ORDER — PROPOFOL 500 MG/50ML IV EMUL
INTRAVENOUS | Status: DC | PRN
Start: 2017-09-13 — End: 2017-09-13
  Administered 2017-09-13 (×2): via INTRAVENOUS
  Administered 2017-09-13: 100 ug/kg/min via INTRAVENOUS

## 2017-09-13 MED ORDER — ONDANSETRON HCL 4 MG PO TABS: 4.0000 mg | ORAL_TABLET | Freq: Four times a day (QID) | ORAL | Status: DC | PRN

## 2017-09-13 MED ORDER — HYDROMORPHONE HCL 1 MG/ML IJ SOLN
0.5000 mg | INTRAMUSCULAR | Status: DC | PRN
  Administered 2017-09-15: 1 mg via INTRAVENOUS
  Filled 2017-09-13: qty 1

## 2017-09-13 MED ORDER — OXYCODONE HCL 5 MG PO TABS: 5.0000 mg | ORAL_TABLET | Freq: Once | ORAL | Status: DC | PRN

## 2017-09-13 MED ORDER — GABAPENTIN 300 MG PO CAPS
300.0000 mg | ORAL_CAPSULE | Freq: Once | ORAL | Status: AC
  Administered 2017-09-13: 300 mg via ORAL

## 2017-09-13 MED ORDER — LIDOCAINE HCL (PF) 2 % IJ SOLN
INTRAMUSCULAR | Status: AC
  Filled 2017-09-13: qty 10

## 2017-09-13 MED ORDER — LACTATED RINGERS IV SOLN
INTRAVENOUS | Status: DC
  Administered 2017-09-13 (×3): via INTRAVENOUS

## 2017-09-13 MED ORDER — BUPIVACAINE LIPOSOME 1.3 % IJ SUSP
INTRAMUSCULAR | Status: DC | PRN
  Administered 2017-09-13: 60 mL

## 2017-09-13 MED ORDER — FENTANYL CITRATE (PF) 100 MCG/2ML IJ SOLN
INTRAMUSCULAR | Status: DC | PRN
  Administered 2017-09-13 (×6): 25 ug via INTRAVENOUS

## 2017-09-13 MED ORDER — PROPOFOL 10 MG/ML IV BOLUS
INTRAVENOUS | Status: DC | PRN
  Administered 2017-09-13: 100 mg via INTRAVENOUS
  Administered 2017-09-13: 30 mg via INTRAVENOUS
  Administered 2017-09-13: 20 mg via INTRAVENOUS

## 2017-09-13 MED ORDER — NEOMYCIN-POLYMYXIN B GU 40-200000 IR SOLN
Status: AC
Start: 2017-09-13 — End: ?
  Filled 2017-09-13: qty 20

## 2017-09-13 MED ORDER — BUPIVACAINE HCL (PF) 0.5 % IJ SOLN
INTRAMUSCULAR | Status: DC | PRN
  Administered 2017-09-13: 2.5 mL via INTRATHECAL

## 2017-09-13 MED ORDER — PAROXETINE HCL 30 MG PO TABS
30.0000 mg | ORAL_TABLET | Freq: Every morning | ORAL | Status: DC
  Administered 2017-09-14 – 2017-09-16 (×3): 30 mg via ORAL
  Filled 2017-09-13 (×4): qty 1

## 2017-09-13 MED ORDER — OXYCODONE HCL 5 MG/5ML PO SOLN: 5.0000 mg | Freq: Once | ORAL | Status: DC | PRN

## 2017-09-13 MED ORDER — MIDAZOLAM HCL 5 MG/5ML IJ SOLN
INTRAMUSCULAR | Status: DC | PRN
  Administered 2017-09-13 (×2): 1 mg via INTRAVENOUS

## 2017-09-13 MED ORDER — PROMETHAZINE HCL 25 MG/ML IJ SOLN: 6.2500 mg | INTRAMUSCULAR | Status: DC | PRN

## 2017-09-13 MED ORDER — ALBUMIN HUMAN 5 % IV SOLN
INTRAVENOUS | Status: DC | PRN
  Administered 2017-09-13: 14:00:00 via INTRAVENOUS

## 2017-09-13 MED ORDER — LEVOTHYROXINE SODIUM 50 MCG PO TABS
50.0000 ug | ORAL_TABLET | Freq: Every day | ORAL | Status: DC
  Administered 2017-09-14 – 2017-09-17 (×4): 50 ug via ORAL
  Filled 2017-09-13 (×5): qty 1

## 2017-09-13 MED ORDER — PANTOPRAZOLE SODIUM 40 MG PO TBEC
40.0000 mg | DELAYED_RELEASE_TABLET | Freq: Two times a day (BID) | ORAL | Status: DC
  Administered 2017-09-13 – 2017-09-16 (×7): 40 mg via ORAL
  Filled 2017-09-13 (×7): qty 1

## 2017-09-13 MED ORDER — VITAMIN D (ERGOCALCIFEROL) 1.25 MG (50000 UNIT) PO CAPS
50000.0000 [IU] | ORAL_CAPSULE | ORAL | Status: DC
  Administered 2017-09-15: 50000 [IU] via ORAL
  Filled 2017-09-13: qty 1

## 2017-09-13 MED ORDER — ONDANSETRON HCL 4 MG/2ML IJ SOLN
INTRAMUSCULAR | Status: DC | PRN
  Administered 2017-09-13 (×2): 4 mg via INTRAVENOUS

## 2017-09-13 MED ORDER — CELECOXIB 200 MG PO CAPS
200.0000 mg | ORAL_CAPSULE | Freq: Two times a day (BID) | ORAL | Status: DC
  Administered 2017-09-13 – 2017-09-16 (×7): 200 mg via ORAL
  Filled 2017-09-13 (×7): qty 1

## 2017-09-13 MED ORDER — GABAPENTIN 300 MG PO CAPS
300.0000 mg | ORAL_CAPSULE | Freq: Every day | ORAL | Status: DC
  Administered 2017-09-13 – 2017-09-16 (×4): 300 mg via ORAL
  Filled 2017-09-13 (×4): qty 1

## 2017-09-13 MED ORDER — CELECOXIB 200 MG PO CAPS
400.0000 mg | ORAL_CAPSULE | Freq: Once | ORAL | Status: AC
  Administered 2017-09-13: 400 mg via ORAL

## 2017-09-13 MED ORDER — EPINEPHRINE PF 1 MG/ML IJ SOLN
INTRAMUSCULAR | Status: AC
  Filled 2017-09-13: qty 1

## 2017-09-13 MED ORDER — GABAPENTIN 300 MG PO CAPS
ORAL_CAPSULE | ORAL | Status: AC
  Filled 2017-09-13: qty 1

## 2017-09-13 MED ORDER — ALBUTEROL SULFATE (2.5 MG/3ML) 0.083% IN NEBU: 3.0000 mL | INHALATION_SOLUTION | Freq: Four times a day (QID) | RESPIRATORY_TRACT | Status: DC | PRN

## 2017-09-13 MED ORDER — FLEET ENEMA 7-19 GM/118ML RE ENEM: 1.0000 | ENEMA | Freq: Once | RECTAL | Status: DC | PRN

## 2017-09-13 SURGICAL SUPPLY — 96 items
ADAPTER BOLT FEMORAL +2/-2 (Knees) ×2 IMPLANT
BLADE CLIPPER SURG (BLADE) ×2 IMPLANT
BLADE OSCILLATING/SAGITTAL (BLADE) ×1
BLADE SAW 1 (BLADE) ×2 IMPLANT
BLADE SAW 1/2 (BLADE) ×2 IMPLANT
BLADE SW THK.38XMED LNG THN (BLADE) ×1 IMPLANT
BONE CEMENT GENTAMICIN (Cement) ×4 IMPLANT
CANISTER SUCT 1200ML W/VALVE (MISCELLANEOUS) ×2 IMPLANT
CANISTER SUCT 3000ML PPV (MISCELLANEOUS) ×4 IMPLANT
CEMENT BONE GENTAMICIN 40 (Cement) ×2 IMPLANT
CNTNR SPEC 2.5X3XGRAD LEK (MISCELLANEOUS)
CONT SPEC 4OZ STER OR WHT (MISCELLANEOUS)
CONTAINER SPEC 2.5X3XGRAD LEK (MISCELLANEOUS) IMPLANT
COOLER POLAR GLACIER W/PUMP (MISCELLANEOUS) ×2 IMPLANT
CUFF TOURN 24 STER (MISCELLANEOUS) ×2 IMPLANT
CUFF TOURN 30 STER DUAL PORT (MISCELLANEOUS) IMPLANT
D242241000 ×2 IMPLANT
D242242000 ×2 IMPLANT
DRAPE SHEET LG 3/4 BI-LAMINATE (DRAPES) ×4 IMPLANT
DRAPE TABLE BACK 80X90 (DRAPES) ×2 IMPLANT
DRAPE XRAY CASSETTE 23X24 (DRAPES) ×2 IMPLANT
DRSG DERMACEA 8X12 NADH (GAUZE/BANDAGES/DRESSINGS) ×2 IMPLANT
DRSG OPSITE POSTOP 4X14 (GAUZE/BANDAGES/DRESSINGS) ×2 IMPLANT
DRSG TEGADERM 4X4.75 (GAUZE/BANDAGES/DRESSINGS) ×2 IMPLANT
DURAPREP 26ML APPLICATOR (WOUND CARE) ×4 IMPLANT
ELECT CAUTERY BLADE 6.4 (BLADE) ×2 IMPLANT
ELECT REM PT RETURN 9FT ADLT (ELECTROSURGICAL) ×2
ELECTRODE REM PT RTRN 9FT ADLT (ELECTROSURGICAL) ×1 IMPLANT
FEM TC3 PFC SZ3 LEFT (Orthopedic Implant) ×2 IMPLANT
FEMORAL ADAPTER (Orthopedic Implant) ×2 IMPLANT
FEMORAL TC3 PFC SZ3 LEFT (Orthopedic Implant) ×1 IMPLANT
GAUZE SPONGE 4X4 12PLY STRL (GAUZE/BANDAGES/DRESSINGS) ×2 IMPLANT
GLOVE BIOGEL M STRL SZ7.5 (GLOVE) ×4 IMPLANT
GLOVE BIOGEL PI IND STRL 7.0 (GLOVE) ×5 IMPLANT
GLOVE BIOGEL PI IND STRL 9 (GLOVE) ×2 IMPLANT
GLOVE BIOGEL PI INDICATOR 7.0 (GLOVE) ×5
GLOVE BIOGEL PI INDICATOR 9 (GLOVE) ×2
GLOVE INDICATOR 8.0 STRL GRN (GLOVE) ×2 IMPLANT
GLOVE SURG SYN 9.0  PF PI (GLOVE) ×3
GLOVE SURG SYN 9.0 PF PI (GLOVE) ×3 IMPLANT
GOWN STRL REUS W/ TWL LRG LVL3 (GOWN DISPOSABLE) ×3 IMPLANT
GOWN STRL REUS W/TWL 2XL LVL3 (GOWN DISPOSABLE) ×2 IMPLANT
GOWN STRL REUS W/TWL LRG LVL3 (GOWN DISPOSABLE) ×3
HANDPIECE VERSAJET DEBRIDEMENT (MISCELLANEOUS) ×2 IMPLANT
HEMOVAC 400CC 10FR (MISCELLANEOUS) ×2 IMPLANT
HOLDER FOLEY CATH W/STRAP (MISCELLANEOUS) ×2 IMPLANT
HOOD PEEL AWAY FLYTE STAYCOOL (MISCELLANEOUS) ×4 IMPLANT
INSERT RP TC3 4 10MM (Knees) ×1 IMPLANT
INSET RP TC3 4 10MM (Knees) ×2 IMPLANT
IRRIGATION STRYKERFLOW (MISCELLANEOUS) ×1 IMPLANT
IRRIGATOR STRYKERFLOW (MISCELLANEOUS) ×2
KIT TURNOVER KIT A (KITS) ×2 IMPLANT
KNIFE SCULPS 14X20 (INSTRUMENTS) ×2 IMPLANT
NDL SAFETY ECLIPSE 18X1.5 (NEEDLE) ×1 IMPLANT
NEEDLE HYPO 18GX1.5 SHARP (NEEDLE) ×1
NEEDLE SPNL 18GX3.5 QUINCKE PK (NEEDLE) ×2 IMPLANT
NEEDLE SPNL 20GX3.5 QUINCKE YW (NEEDLE) ×4 IMPLANT
NS IRRIG 1000ML POUR BTL (IV SOLUTION) ×2 IMPLANT
NS IRRIG 500ML POUR BTL (IV SOLUTION) ×4 IMPLANT
OSTEOTOME THIN 10.0 1.5 (INSTRUMENTS) ×2 IMPLANT
OSTEOTOME THIN 6.0 1.5 (INSTRUMENTS) ×2 IMPLANT
PACK TOTAL KNEE (MISCELLANEOUS) ×2 IMPLANT
PAD ABD DERMACEA PRESS 5X9 (GAUZE/BANDAGES/DRESSINGS) ×2 IMPLANT
PAD WRAPON POLAR KNEE (MISCELLANEOUS) ×1 IMPLANT
PATELLA DOME PFC 38MM (Knees) ×2 IMPLANT
POST AVE PFC 8MM (Knees) ×4 IMPLANT
PULSAVAC PLUS IRRIG FAN TIP (DISPOSABLE) ×2
SLEEVE MBT PROUS ML 29MM (Knees) ×2 IMPLANT
SLEEVE UNIV FRM 40MM (Knees) ×2 IMPLANT
SOL .9 NS 3000ML IRR  AL (IV SOLUTION) ×2
SOL .9 NS 3000ML IRR UROMATIC (IV SOLUTION) ×2 IMPLANT
SPONGE DRAIN TRACH 4X4 STRL 2S (GAUZE/BANDAGES/DRESSINGS) ×2 IMPLANT
SPONGE LAP 18X18 RF (DISPOSABLE) ×4 IMPLANT
STAPLER SKIN PROX 35W (STAPLE) ×2 IMPLANT
STEM TIBIA PFC 13X30MM (Stem) ×2 IMPLANT
STEM UNIVERSAL REVISION 75X16 (Stem) ×2 IMPLANT
SUCTION FRAZIER HANDLE 10FR (MISCELLANEOUS) ×1
SUCTION TUBE FRAZIER 10FR DISP (MISCELLANEOUS) ×1 IMPLANT
SUT MNCRL 0 1X36 CT-1 (SUTURE) IMPLANT
SUT MON AB 2-0 CT1 36 (SUTURE) IMPLANT
SUT MONOCRYL 0 (SUTURE)
SUT PROLENE 1 CT 1 30 (SUTURE) IMPLANT
SUT VIC AB 0 CT1 36 (SUTURE) ×2 IMPLANT
SUT VIC AB 1 CT1 36 (SUTURE) ×4 IMPLANT
SUT VIC AB 2-0 CT1 (SUTURE) ×2 IMPLANT
SWAB CULTURE AMIES ANAERIB BLU (MISCELLANEOUS) ×6 IMPLANT
SYR 20CC LL (SYRINGE) ×2 IMPLANT
SYR 30ML LL (SYRINGE) ×4 IMPLANT
TIP COAXIAL FEMORAL CANAL (MISCELLANEOUS) ×2 IMPLANT
TIP FAN IRRIG PULSAVAC PLUS (DISPOSABLE) ×1 IMPLANT
TOWEL OR 17X26 4PK STRL BLUE (TOWEL DISPOSABLE) ×2 IMPLANT
TOWER CARTRIDGE SMART MIX (DISPOSABLE) ×2 IMPLANT
TRAY FOLEY MTR SLVR 16FR STAT (SET/KITS/TRAYS/PACK) ×2 IMPLANT
TRAY REVISION SZ 4 (Knees) ×2 IMPLANT
TUBING CONNECTING 10 (TUBING) ×2 IMPLANT
WRAPON POLAR PAD KNEE (MISCELLANEOUS) ×2

## 2017-09-13 NOTE — Anesthesia Post-op Follow-up Note (Signed)
Anesthesia QCDR form completed.        

## 2017-09-13 NOTE — Anesthesia Procedure Notes (Signed)
Spinal  Patient location during procedure: OR Start time: 09/13/2017 9:46 AM End time: 09/13/2017 9:48 AM Staffing Anesthesiologist: Emmie Niemann, MD Other anesthesia staff: Wess Botts, RN Performed: anesthesiologist and other anesthesia staff  Preanesthetic Checklist Completed: patient identified, site marked, surgical consent, pre-op evaluation, timeout performed, IV checked, risks and benefits discussed and monitors and equipment checked Spinal Block Patient position: sitting Prep: ChloraPrep Patient monitoring: heart rate, continuous pulse ox and blood pressure Approach: midline Location: L4-5 Injection technique: single-shot Needle Needle type: Pencil-Tip and Introducer  Needle gauge: 24 G Needle length: 9 cm

## 2017-09-13 NOTE — Discharge Instructions (Signed)
°  Instructions after Total Knee Replacement ° ° Rozann Holts P. Nema Oatley, Jr., M.D.    ° Dept. of Orthopaedics & Sports Medicine ° [facility] ° [address] ° [place], [place]  [address] ° Phone: [phone]   Fax: [phone] ° °  °DIET: °• Drink plenty of non-alcoholic fluids. °• Resume your normal diet. Include foods high in fiber. ° °ACTIVITY:  °• You may use crutches or a walker with weight-bearing as tolerated, unless instructed otherwise. °• You may be weaned off of the walker or crutches by your Physical Therapist.  °• Do NOT place pillows under the knee. Anything placed under the knee could limit your ability to straighten the knee.   °• Continue doing gentle exercises. Exercising will reduce the pain and swelling, increase motion, and prevent muscle weakness.   °• Please continue to use the TED compression stockings for 6 weeks. You may remove the stockings at night, but should reapply them in the morning. °• Do not drive or operate any equipment until instructed. ° °WOUND CARE:  °• Continue to use the PolarCare or ice packs periodically to reduce pain and swelling. °• You may bathe or shower after the staples are removed at the first office visit following surgery. ° °MEDICATIONS: °• You may resume your regular medications. °• Please take the pain medication as prescribed on the medication. °• Do not take pain medication on an empty stomach. °• You have been given a prescription for a blood thinner (Lovenox or Coumadin). Please take the medication as instructed. (NOTE: After completing a 2 week course of Lovenox, take one Enteric-coated aspirin once a day. This along with elevation will help reduce the possibility of phlebitis in your operated leg.) °• Do not drive or drink alcoholic beverages when taking pain medications. ° °CALL THE OFFICE FOR: °• Temperature above 101 degrees °• Excessive bleeding or drainage on the dressing. °• Excessive swelling, coldness, or paleness of the toes. °• Persistent  nausea and vomiting. ° °FOLLOW-UP:  °• You should have an appointment to return to the office in 10-14 days after surgery. °• Arrangements have been made for continuation of Physical Therapy (either home therapy or outpatient therapy). °  °

## 2017-09-13 NOTE — Transfer of Care (Signed)
Immediate Anesthesia Transfer of Care Note  Patient: Dorothy Church  Procedure(s) Performed: TOTAL KNEE REVISION (Left Knee)  Patient Location: PACU  Anesthesia Type:General  Level of Consciousness: sedated and responds to stimulation  Airway & Oxygen Therapy: Patient Spontanous Breathing and Patient connected to face mask oxygen  Post-op Assessment: Report given to RN and Post -op Vital signs reviewed and stable  Post vital signs: Reviewed and stable  Last Vitals:  Vitals Value Taken Time  BP 98/48 09/13/2017  5:24 PM  Temp 36.7 C 09/13/2017  5:20 PM  Pulse 86 09/13/2017  5:25 PM  Resp 11 09/13/2017  5:25 PM  SpO2 100 % 09/13/2017  5:25 PM  Vitals shown include unvalidated device data.  Last Pain:  Vitals:   09/13/17 1720  TempSrc:   PainSc: Asleep         Complications: No apparent anesthesia complications

## 2017-09-13 NOTE — Op Note (Signed)
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 M.D.  ASSISTANT:  Vance Peper, PA (present and scrubbed throughout the case, critical for assistance with exposure, retraction, instrumentation, and closure)  ANESTHESIA: spinal and general  ESTIMATED BLOOD LOSS: 600 mL  FLUIDS REPLACED: 2300 mL of crystalloid  TOURNIQUET TIME: #1 - 120 minutes #2 - 52 minuts  DRAINS: 2 medium Hemovac drains  IMPLANTS UTILIZED: DePuy Sigma TC3 3 size 3 posterior stabilized femoral component (cemented), 5 degree femoral adapter, +2 femoral adapter bolt, 40 mm universal femoral sleeve (full porous), 30 mm x 13 mm Sigma modular femoral stem, 8 mm posterior augments (medial and lateral), size 4 MBT revision tibial component (cemented), 29 mm MBT revision metaphyseal sleeve (porous), 16 mm x 75 mm universal tibial fluted stem, 38 mm 3 peg oval dome patella (cemented), and a 10 mm TC3 rotating platform polyethylene insert.  INDICATIONS FOR SURGERY: Dorothy Church is a 82 y.o. year old female who previously underwent a left total knee arthroplasty approximately 40 years ago in [place].  She was doing well with the exception of limited range of motion when she had the sudden onset of pain while ambulating.  X-rays demonstrated fracture of the polyethylene tibial component with areas of ostial lysis and polyethylene wear.  Recommendation was made for left total knee revision.  After discussion of the risks and benefits of surgical intervention, the patient expressed understanding of the risks benefits and agree with plans for total knee revision arthroplasty.   The risks, benefits, and alternatives were  discussed at length including but not limited to the risks of infection, bleeding, nerve injury, stiffness, blood clots, the need for revision surgery, cardiopulmonary complications, among others, and they were willing to proceed.  PROCEDURE IN DETAIL: The patient was brought into the operating room and, after adequate spinal and general anesthesia was achieved, a tourniquet was placed on the patient's upper thigh. The patient's knee and leg were cleaned and prepped with alcohol and DuraPrep and draped in the usual sterile fashion. A "timeout" was performed as per usual protocol. The lower extremity was exsanguinated using an Esmarch, and the tourniquet was inflated to 300 mmHg. An anterior longitudinal incision was made followed by a standard medial parapatellar approach. The deep fibers of the medial collateral ligament were elevated in a subperiosteal fashion off of the medial flare of the tibia so as to maintain a continuous soft tissue sleeve. The patella was subluxed laterally and the patellofemoral ligament was incised.  Abundant fibrotic tissue had tethered the superior aspect of the patella in both the medial and lateral gutters were also fibrosed.  The fibrotic tissue was sharply excised so as to better mobilize the soft tissue.  Inspection of the all polyethylene tibial component demonstrated a fracture of the medial plateau with frank subsidence.  There was also noted to be a perforation of the cortex of the medial flare of the tibia.  Inspection of the femoral component demonstrated large areas of osteolytic lesions under the anterior flange.  Thin flexible osteotomes were used to mobilize the femoral component and the femoral component was removed without difficulty.  Again, large osteolytic lesions were noted with bone loss involving the medial  and lateral femoral condyles.  Swabs were obtained of the synovial tissue as well as of the femoral canal and submitted for stat Gram stain cultures and  sensitivity.  Next, debridement of soft tissue was performed around the tibial component.  The peripheral rim was intact.  The polyethylene post was cut using a TPS saw in the polyethylene tray was removed.  Subsequently, the polyethylene post was removed.  Methylmethacrylate cement was removed using splitting osteotomes and curettes.  Large osteolytic defects were noted primarily to the medial aspect and to a lesser degree to the lateral aspect of the tibial plateau.  The versa jet was used to debride soft tissue from the bony margins.  The femoral and tibial canals were reamed in a sequential fashion with the tibial canal reamed up to a 16 mm diameter.  Due to the previous femoral shaft fracture, reaming of the femoral canal was limited and it was elected to proceed with use of a metaphyseal sleeve and a short stem.  Intraoperative x-ray was used to confirm the appropriate length.  Femoral broaching was performed up to a 40 mm metaphyseal sleeve.  Good fixation was appreciated.  A distal femoral "cleanup" cut was performed.  Sizing was performed it was felt that a size 3 femoral component was appropriate.  A size 3 cutting block was positioned with rotation assessed.  The anterior, posterior, chamfer cuts were performed.  The posterior cuts were performed so was to accommodate 8 mm posterior medial and posterior lateral augments.  Finally, the cutting guide for the central box was positioned and the cut performed.   The tourniquet was deflated after initial tourniquet time of 120 minutes.  Attention was then directed to the proximal tibia.  A proximal tibial "cleanup" cut was performed using the intramedullary cutting guide.  The proximal tibia was sized and was felt that a size 4 tibial tray was appropriate.  The tray was provisionally fixed and the metaphyseal region was reamed and subsequently broached up to a 29 mm tibial sleeve.  Good fixation was appreciated with the 29 mm tibial sleeve with the 75 mm x 16  mm stem.  Trial components were placed followed by insertion of a 10 mm polyethylene.  Full extension was noted and good flexion was appreciated.  Finally, attention was directed to the patella.  A TPS saw was used to cut the interface between the patellar component and the cement mantle.  The cement was then debrided and removed and a cleanup cut was performed on the patella.  Patella was sized and was felt that a 38 mm patellar component was appropriate.  Patellar trial was placed in the was placed range of motion with good patellar tracking appreciated.  The left lower extremity was then exsanguinated second time using the Esmarch and the tourniquet was reinflated to 300 mmHg.  Trial components were removed and the cut surfaces of bone were irrigated with copious amounts of normal saline with antibiotic solution using pulsatile lavage and then suctioned dry. Polymethylmethacrylate cement with gentamicin was prepared in the usual fashion using a vacuum mixer. Cement was applied to the cut surface of the proximal tibia as well as along the undersurface of a size 4 MBT revision tibial component with the 29 mm tibial metaphyseal sleeve and the 75 mm x 16 mm fluted stem. Tibial component was positioned and impacted into place. Excess cement was removed using [employer] fast streamer. Cement was then applied to the cut surfaces of the femur as well as  along the posterior flanges of the size 3 TC 3 femoral component with a 40 mm metaphyseal sleeve, 8 mm posterior medial and posterior lateral augments, and a 13 mm x 30 mm modular femoral stem. The femoral component was positioned and impacted into place. Excess cement was removed using [employer] fast streamer. A 10 mm polyethylene trial was inserted and the knee was brought into full extension with steady axial compression applied. Finally, cement was applied to the backside of a 38 mm 3 peg oval dome patella and the patellar component was positioned and patellar clamp applied. Excess  cement was removed using [employer] fast streamer. After adequate curing of the cement, the tourniquet was deflated after a second tourniquet time of 52 minutes. Hemostasis was achieved using electrocautery. The knee was irrigated with copious amounts of normal saline with antibiotic solution using pulsatile lavage and then suctioned dry. 20 mL of 1.3% Exparel and 60 mL of 0.25% Marcaine in 40 mL of normal saline was injected along the posterior capsule, medial and lateral gutters, and along the arthrotomy site. A 10 mm TC3rotating platform polyethylene insert was inserted and the knee was placed through a range of motion with excellent mediolateral soft tissue balancing appreciated and excellent patellar tracking noted. 2 medium drains were placed in the wound bed and brought out through separate stab incisions. The medial parapatellar portion of the incision was reapproximated using interrupted sutures of #1 Vicryl. Subcutaneous tissue was approximated in layers using first #0 Vicryl followed #2-0 Vicryl. The skin was approximated with skin staples. A sterile dressing was applied.  The patient tolerated the procedure well and was transported to the recovery room in stable condition.    James P. Holley Bouche., M.D.

## 2017-09-13 NOTE — Anesthesia Preprocedure Evaluation (Addendum)
Anesthesia Evaluation  Patient identified by MRN, date of birth, ID band Patient awake    Reviewed: Allergy & Precautions, NPO status , Patient's Chart, lab work & pertinent test results  History of Anesthesia Complications Negative for: history of anesthetic complications  Airway Mallampati: III  TM Distance: >3 FB Neck ROM: Full    Dental no notable dental hx.    Pulmonary asthma , sleep apnea , COPD,  COPD inhaler,    breath sounds clear to auscultation- rhonchi (-) wheezing      Cardiovascular Exercise Tolerance: Good (-) hypertension(-) CAD, (-) Past MI, (-) Cardiac Stents and (-) CABG  Rhythm:Regular Rate:Normal - Systolic murmurs and - Diastolic murmurs Echo 37/16/96: - Left ventricle: The cavity size was normal. Wall thickness was at   the upper limits of normal. Systolic function was normal. The   estimated ejection fraction was in the range of 60% to 65%.   Doppler parameters are consistent with abnormal left ventricular   relaxation (grade 1 diastolic dysfunction). - Mitral valve: Mildly thickened leaflets . There was trivial   regurgitation. - Right ventricle: The cavity size was normal. Systolic function   was normal.   Neuro/Psych PSYCHIATRIC DISORDERS Depression negative neurological ROS     GI/Hepatic Neg liver ROS, hiatal hernia, GERD  ,  Endo/Other  neg diabetesHypothyroidism   Renal/GU negative Renal ROS     Musculoskeletal  (+) Arthritis ,   Abdominal (+) - obese,   Peds  Hematology negative hematology ROS (+)   Anesthesia Other Findings Past Medical History: No date: Aortic valve disorders No date: Arthritis No date: Barrett's esophagus No date: COPD (chronic obstructive pulmonary disease) (HCC) No date: GERD (gastroesophageal reflux disease) No date: History of hiatal hernia No date: Hypercholesteremia No date: Hypothyroidism No date: Impaired fasting glucose No date: Intrinsic  asthma, unspecified No date: Obstructive sleep apnea (adult) (pediatric) No date: Osteoporosis No date: Pneumonia No date: Pure hypercholesterolemia No date: Syncope and collapse No date: TIA (transient ischemic attack) No date: Vitamin D deficiency   Reproductive/Obstetrics                             Lab Results  Component Value Date   WBC 7.1 08/30/2017   HGB 14.1 08/30/2017   HCT 41.2 08/30/2017   MCV 91.2 08/30/2017   PLT 221 08/30/2017    Anesthesia Physical Anesthesia Plan  ASA: III  Anesthesia Plan: Spinal   Post-op Pain Management:    Induction:   PONV Risk Score and Plan: 2 and Propofol infusion  Airway Management Planned: Natural Airway  Additional Equipment:   Intra-op Plan:   Post-operative Plan:   Informed Consent: I have reviewed the patients History and Physical, chart, labs and discussed the procedure including the risks, benefits and alternatives for the proposed anesthesia with the patient or authorized representative who has indicated his/her understanding and acceptance.   Dental advisory given  Plan Discussed with: CRNA and Anesthesiologist  Anesthesia Plan Comments: (Discussed possible conversion to GA with LMA if needed for prolonged duration of case)       Anesthesia Quick Evaluation

## 2017-09-13 NOTE — Anesthesia Procedure Notes (Signed)
Procedure Name: LMA Insertion Date/Time: 09/13/2017 3:04 PM Performed by: Jonna Clark, CRNA Pre-anesthesia Checklist: Patient identified, Patient being monitored, Timeout performed, Emergency Drugs available and Suction available Patient Re-evaluated:Patient Re-evaluated prior to induction Oxygen Delivery Method: Circle system utilized Preoxygenation: Pre-oxygenation with 100% oxygen Induction Type: IV induction Ventilation: Mask ventilation without difficulty LMA: LMA inserted LMA Size: 3.5 Tube type: Oral Number of attempts: 1 Placement Confirmation: positive ETCO2 and breath sounds checked- equal and bilateral Tube secured with: Tape Dental Injury: Teeth and Oropharynx as per pre-operative assessment

## 2017-09-14 LAB — BASIC METABOLIC PANEL
ANION GAP: 4 — AB (ref 5–15)
BUN: 15 mg/dL (ref 8–23)
CALCIUM: 7 mg/dL — AB (ref 8.9–10.3)
CO2: 27 mmol/L (ref 22–32)
Chloride: 108 mmol/L (ref 98–111)
Creatinine, Ser: 0.78 mg/dL (ref 0.44–1.00)
Glucose, Bld: 140 mg/dL — ABNORMAL HIGH (ref 70–99)
Potassium: 4 mmol/L (ref 3.5–5.1)
SODIUM: 139 mmol/L (ref 135–145)

## 2017-09-14 LAB — CBC
HEMATOCRIT: 25.6 % — AB (ref 35.0–47.0)
Hemoglobin: 8.9 g/dL — ABNORMAL LOW (ref 12.0–16.0)
MCH: 31.4 pg (ref 26.0–34.0)
MCHC: 34.8 g/dL (ref 32.0–36.0)
MCV: 90.1 fL (ref 80.0–100.0)
PLATELETS: 158 10*3/uL (ref 150–440)
RBC: 2.84 MIL/uL — ABNORMAL LOW (ref 3.80–5.20)
RDW: 13.3 % (ref 11.5–14.5)
WBC: 8.1 10*3/uL (ref 3.6–11.0)

## 2017-09-14 NOTE — Evaluation (Signed)
Occupational Therapy Evaluation Patient Details Name: Dorothy Church MRN: [id number] DOB: October 31, 1935 Today's Date: 09/14/2017    History of Present Illness Dorothy Church is a 82yo female presenting on 7/27 POD1 s/p Left TKA revision. 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    Clinical Impression   Pt seen for OT evaluation this date, POD#1 from above surgery. Pt was independent in all ADLs prior to surgery, however occasionally using RW recently due to L knee pain/dysfunction. Pt is eager to return to PLOF with less pain and improved safety and independence. Pt currently requires minimal assist for LB dressing while in seated position due to pain and limited AROM of L knee. Pt instructed in polar care mgt, falls prevention strategies, home/routines modifications, DME/AE for LB bathing and dressing tasks, and compression stocking mgt. Pt required CGA for transfers with initial instruction in RW use and hand/foot placement. Pt lightheaded during transfers from recliner <> BSC with stand pivot transfer. Noted to be orthostatic with PT just prior to OT evaluation. Lightheadedness improved once seated again. MD notified when he came to assess pt. Pt would benefit from skilled OT services including additional instruction in dressing techniques with or without assistive devices for dressing and bathing skills to support recall and carryover prior to discharge and ultimately to maximize safety, independence, and minimize falls risk and caregiver burden. Due to decreased safety with mobility secondary to impairments in strength, ROM, pain, and orthostatic BP, recommend STR at this time with supervision for all mobility.    Follow Up Recommendations  SNF;Other (comment)(supervision for OOB/mobility)    Equipment Recommendations  Other (comment)(TBD)    Recommendations for Other Services       Precautions / Restrictions Precautions Precautions: Fall Required Braces or Orthoses:  Knee Immobilizer - Left Knee Immobilizer - Left: Discontinue once straight leg raise with < 10 degree lag(no KI donned, able to do SLR) Restrictions Weight Bearing Restrictions: Yes LLE Weight Bearing: Weight bearing as tolerated      Mobility Bed Mobility Overal bed mobility: Modified Independent             General bed mobility comments: deferred, up in recliner at start and end of session  Transfers Overall transfer level: Needs assistance Equipment used: Rolling walker (2 wheeled) Transfers: Sit to/from [facility] Sit to Stand: Min assist;Min guard Stand pivot transfers: Min guard       General transfer comment: cues for foot placement, scooting to edge of chair, and hand placement with good carryover and improved independence with transfer    Balance Overall balance assessment: Needs assistance Sitting-balance support: Feet supported;No upper extremity supported Sitting balance-Leahy Scale: Good     Standing balance support: During functional activity;Single extremity supported;No upper extremity supported Standing balance-Leahy Scale: Fair                             ADL either performed or assessed with clinical judgement   ADL Overall ADL's : Needs assistance/impaired Eating/Feeding: Sitting;Independent   Grooming: Sitting;Independent   Upper Body Bathing: Sitting;Supervision/ safety   Lower Body Bathing: Sit to/from stand;Minimal assistance   Upper Body Dressing : Sitting;Supervision/safety   Lower Body Dressing: Sit to/from stand;Minimal assistance Lower Body Dressing Details (indicate cue type and reason): instructed in AE for LB dressing to improve independence and safety Toilet Transfer: RW;BSC;Minimal assistance;Min guard Toilet Transfer Details (indicate cue type and reason): CGA- min assist with cues for hand/foot placement with  RW to improve safety and independence  Toileting- Water quality scientist and Hygiene:  Sitting/lateral lean;Set up       Functional mobility during ADLs: Min guard;Rolling walker       Vision Baseline Vision/History: Wears glasses Wears Glasses: At all times Patient Visual Report: No change from baseline       Perception     Praxis      Pertinent Vitals/Pain Pain Assessment: 0-10 Pain Score: 5  Pain Location: Left knee (posterior)  Pain Descriptors / Indicators: Aching Pain Intervention(s): Limited activity within patient's tolerance;Monitored during session;Repositioned;Ice applied     Hand Dominance Right   Extremity/Trunk Assessment Upper Extremity Assessment Upper Extremity Assessment: Overall WFL for tasks assessed   Lower Extremity Assessment Lower Extremity Assessment: Defer to PT evaluation LLE Deficits / Details: grossly 3+/5  LLE: Unable to fully assess due to pain   Cervical / Trunk Assessment Cervical / Trunk Assessment: Normal   Communication Communication Communication: No difficulties   Cognition Arousal/Alertness: Awake/alert Behavior During Therapy: WFL for tasks assessed/performed Overall Cognitive Status: Within Functional Limits for tasks assessed                                     General Comments  pt orthostatic with PT just prior to OT session, pt report feeling lightheaded with transfer to/from [facility], improved once back to recliner    Exercises Other Exercises Other Exercises: pt instructed in compression stocking mgt Other Exercises: pt instructed in polar care mgt   Shoulder Instructions      Home Living Family/patient expects to be discharged to:: Private residence Living Arrangements: Alone Available Help at Discharge: Family;Friend(s);Available PRN/intermittently Type of Home: Independent living facility       Home Layout: One level     Bathroom Shower/Tub: Occupational psychologist: Standard     Home Equipment: Environmental consultant - 2 wheels;Grab bars - tub/shower;Shower seat;Bedside  commode   Additional Comments: ILF at [facility]      Prior Functioning/Environment Level of Independence: Independent with assistive device(s)        Comments: recent RW use as recommended by ortho d/t deteriorating state of mobility, no assist required for ADL, driving, med mgt.         OT Problem List: Decreased strength;Decreased knowledge of use of DME or AE;Decreased range of motion;Pain;Impaired balance (sitting and/or standing)      OT Treatment/Interventions: Self-care/ADL training;Therapeutic exercise;Balance training;DME and/or AE instruction;Patient/family education;Therapeutic activities    OT Goals(Current goals can be found in the care plan section) Acute Rehab OT Goals Patient Stated Goal: return to independent function as PTA OT Goal Formulation: With patient Time For Goal Achievement: 09/28/17 Potential to Achieve Goals: Good ADL Goals Pt Will Perform Lower Body Dressing: with supervision;sit to/from stand;with adaptive equipment Pt Will Transfer to Toilet: with supervision;ambulating(BSC over toilet, LRAD for amb) Additional ADL Goal #1: Pt will independently instruct caregiver in compression stocking mgt Additional ADL Goal #2: Pt will independently instruct caregiver in polar care mgt.  OT Frequency: Min 1X/week   Barriers to D/C:            Co-evaluation              AM-PAC PT "6 Clicks" Daily Activity     Outcome Measure Help from another person eating meals?: None Help from another person taking care of personal grooming?: None Help from another person toileting, which  includes using toliet, bedpan, or urinal?: A Little Help from another person bathing (including washing, rinsing, drying)?: A Little Help from another person to put on and taking off regular upper body clothing?: None Help from another person to put on and taking off regular lower body clothing?: A Little 6 Click Score: 21   End of Session Equipment Utilized During  Treatment: Gait belt;Rolling walker  Activity Tolerance: Patient tolerated treatment well Patient left: in chair;with call bell/phone within reach;with chair alarm set;with SCD's reapplied;Other (comment)(polar care in place)  OT Visit Diagnosis: Other abnormalities of gait and mobility (R26.89);Pain Pain - Right/Left: Left Pain - part of body: Knee                Time: 5537-4827 OT Time Calculation (min): 35 min Charges:  OT General Charges $OT Visit: 1 Visit OT Evaluation $OT Eval Low Complexity: 1 Low OT Treatments $Self Care/Home Management : 23-37 mins  Jeni Salles, MPH, MS, OTR/L ascom [phone] 09/14/17, 10:56 AM

## 2017-09-14 NOTE — Evaluation (Signed)
Physical Therapy Evaluation Patient Details Name: Dorothy Church MRN: [id number] DOB: August 26, 1935 Today's Date: 09/14/2017   History of Present Illness  Dorothy Church Church is a 82yo female presenting on 7/27 POD1 s/p Left TKA revision. 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   Clinical Impression  Pt admitted with above diagnosis. Pt currently with functional limitations due to the deficits listed below (see "PT Problem List"). Upon entry, pt in bed, no family/caregiver present. The pt is awake and agreeable to participate.   The pt is alert and oriented x3, pleasant, conversational, and following simple commands consistently. Pt able to complete most of HEP without physical assistance, performs SLR in bed without any signs of pain increase. AMB is deferred d/t hypotension and orthostasis. Functional mobility assessment demonstrates increased effort/time requirements, poor tolerance, and need for physical assistance, whereas the patient performed these at a higher level of independence PTA. Pt will benefit from skilled PT intervention to increase independence and safety with basic mobility in preparation for discharge to the venue listed below.       Follow Up Recommendations SNF;Supervision for mobility/OOB    Equipment Recommendations  None recommended by PT    Recommendations for Other Services       Precautions / Restrictions Precautions Precautions: Fall Required Braces or Orthoses: Knee Immobilizer - Left Knee Immobilizer - Left: Discontinue once straight leg raise with < 10 degree lag Restrictions Weight Bearing Restrictions: Yes LLE Weight Bearing: Weight bearing as tolerated      Mobility  Bed Mobility Overal bed mobility: Modified Independent             General bed mobility comments: additional time needed.   Transfers Overall transfer level: Needs assistance Equipment used: Rolling walker (2 wheeled) Transfers: Sit to/from [employer] Sit to Stand: Mod assist Stand pivot transfers: Min guard       General transfer comment: noted safe use of RW during transfer.   Ambulation/Gait Ambulation/Gait assistance: (pivot to chair only d/t hypotension; noted otrthostatic BP after transfer. )              Stairs            Wheelchair Mobility    Modified Rankin (Stroke Patients Only)       Balance Overall balance assessment: Mild deficits observed, not formally tested                                           Pertinent Vitals/Pain Pain Assessment: 0-10 Pain Score: 6  Pain Location: Left knee (posterior)  Pain Descriptors / Indicators: Aching Pain Intervention(s): Limited activity within patient's tolerance;Monitored during session    Home Living Family/patient expects to be discharged to:: Private residence Living Arrangements: Alone   Type of Home: Independent living facility       Home Layout: One level Home Equipment: Environmental consultant - 2 wheels Additional Comments: ILF at [facility]    Prior Function Level of Independence: Independent with assistive device(s)         Comments: recent RW use as recommended by ortho d/t deteriorating state of mobility     Hand Dominance        Extremity/Trunk Assessment   Upper Extremity Assessment Upper Extremity Assessment: Overall WFL for tasks assessed;Defer to OT evaluation    Lower Extremity Assessment Lower Extremity Assessment: LLE deficits/detail LLE Deficits / Details: grossly 3+/5  LLE: Unable to fully assess due to pain       Communication   Communication: No difficulties  Cognition Arousal/Alertness: Awake/alert Behavior During Therapy: WFL for tasks assessed/performed                                          General Comments      Exercises Total Joint Exercises Ankle Circles/Pumps: AROM;15 reps;Both Quad Sets: AROM;Left;15 reps Heel Slides: AAROM;Left;5 reps Hip  ABduction/ADduction: AROM;Left;15 reps Straight Leg Raises: AROM;Left;5 reps Goniometric ROM: 7-73 degrees Left Knee flexion P/ROM    Assessment/Plan    PT Assessment Patient needs continued PT services  PT Problem List Decreased strength;Decreased range of motion;Decreased activity tolerance;Decreased mobility;Pain;Decreased knowledge of use of DME;Decreased knowledge of precautions       PT Treatment Interventions DME instruction;Gait training;Balance training;Functional mobility training;Therapeutic activities;Therapeutic exercise;Patient/family education    PT Goals (Current goals can be found in the Care Plan section)  Acute Rehab PT Goals Patient Stated Goal: return to independent function as PTA PT Goal Formulation: With patient Time For Goal Achievement: 09/28/17 Potential to Achieve Goals: Good    Frequency BID   Barriers to discharge Decreased caregiver support      Co-evaluation               AM-PAC PT "6 Clicks" Daily Activity  Outcome Measure Difficulty turning over in bed (including adjusting bedclothes, sheets and blankets)?: None Difficulty moving from lying on back to sitting on the side of the bed? : None Difficulty sitting down on and standing up from a chair with arms (e.g., wheelchair, bedside commode, etc,.)?: Unable Help needed moving to and from a bed to chair (including a wheelchair)?: A Little Help needed walking in hospital room?: Total Help needed climbing 3-5 steps with a railing? : Total 6 Click Score: 14    End of Session   Activity Tolerance: Patient tolerated treatment well;Patient limited by fatigue;Treatment limited secondary to medical complications (Comment)(orthostatic BP) Patient left: in chair;with chair alarm set;with call bell/phone within reach;with SCD's reapplied Nurse Communication: Mobility status PT Visit Diagnosis: Difficulty in walking, not elsewhere classified (R26.2);Muscle weakness (generalized) (M62.81);Other  abnormalities of gait and mobility (R26.89)    Time: 6384-5364 PT Time Calculation (min) (ACUTE ONLY): 29 min   Charges:   PT Evaluation $PT Eval Moderate Complexity: 1 Mod PT Treatments $Therapeutic Exercise: 8-22 mins       10:00 AM, 09/14/17 Dorothy Church, PT, DPT Physical Therapist - [facility]  [phone] ([place])     Swayze Kozuch C 09/14/2017, 9:59 AM

## 2017-09-14 NOTE — Progress Notes (Signed)
   Subjective: 1 Day Post-Op Procedure(s) (LRB): TOTAL KNEE REVISION (Left) Patient reports pain as 4 on 0-10 scale.   Patient is well, and has had no acute complaints or problems We will start therapy today.  Plan is to go Rehab after hospital stay. no nausea and no vomiting Patient denies any chest pains or shortness of breath. Patient resting comfortably.  Voicing no complaints Objective: Vital signs in last 24 hours: Temp:  [97.8 F (36.6 C)-98.4 F (36.9 C)] 97.8 F (36.6 C) (07/27 0733) Pulse Rate:  [68-103] 91 (07/27 0733) Resp:  [10-20] 18 (07/27 0733) BP: (78-118)/(43-82) 95/59 (07/27 0733) SpO2:  [92 %-100 %] 96 % (07/27 0733) Heels are non tender and elevated off the bed using rolled towels along with bone foam under operative heel Intake/Output from previous day: 07/26 0701 - 07/27 0700 In: 4193.3 [P.O.:480; I.V.:3245; IV Piggyback:408.3] Out: 2330 [Urine:1620; Drains:110; Blood:600] Intake/Output this shift: No intake/output data recorded.  Recent Labs    09/14/17 0515  HGB 8.9*   Recent Labs    09/14/17 0515  WBC 8.1  RBC 2.84*  HCT 25.6*  PLT 158   Recent Labs    09/14/17 0515  NA 139  K 4.0  CL 108  CO2 27  BUN 15  CREATININE 0.78  GLUCOSE 140*  CALCIUM 7.0*   No results for input(s): LABPT, INR in the last 72 hours.  EXAM General - Patient is Alert, Appropriate and Oriented Extremity - Neurologically intact Neurovascular intact Sensation intact distally Intact pulses distally Dorsiflexion/Plantar flexion intact Compartment soft Dressing - dressing C/D/I Motor Function - intact, moving foot and toes well on exam.  Able to do straight leg raise on her own  Past Medical History:  Diagnosis Date  . Aortic valve disorders   . Arthritis   . Barrett's esophagus   . COPD (chronic obstructive pulmonary disease) ([place])   . GERD (gastroesophageal reflux disease)   . History of hiatal hernia   . Hypercholesteremia   . Hypothyroidism   .  Impaired fasting glucose   . Intrinsic asthma, unspecified   . Obstructive sleep apnea (adult) (pediatric)   . Osteoporosis   . Pneumonia   . Pure hypercholesterolemia   . Syncope and collapse   . TIA (transient ischemic attack)   . Vitamin D deficiency     Assessment/Plan: 1 Day Post-Op Procedure(s) (LRB): TOTAL KNEE REVISION (Left) Active Problems:   S/P total knee arthroplasty  Estimated body mass index is 23.4 kg/m as calculated from the following:   Height as of this encounter: 5\' 6"  (1.676 m).   Weight as of this encounter: 65.8 kg (145 lb). Advance diet Up with therapy D/C IV fluids Plan for discharge tomorrow Discharge to SNF  Labs: Were reviewed.  Hemoglobin 8.9. DVT Prophylaxis - Lovenox, Foot Pumps and TED hose Weight-Bearing as tolerated to left leg D/C O2 and Pulse OX and try on Room Air Begin working on bowel movement  Jacqeline Broers R. [place] 09/14/2017, 7:59 AM

## 2017-09-14 NOTE — Clinical Social Work Note (Signed)
Clinical Social Work Assessment  Patient Details  Name: Dorothy Church MRN: [id number] Date of Birth: 02-14-1936  Date of referral:  09/14/17               Reason for consult:  Facility Placement                Permission sought to share information with:  Case Manager, Customer service manager, Family Supports Permission granted to share information::  Yes, Verbal Permission Granted  Name::        Agency::     Relationship::     Contact Information:     Housing/Transportation Living arrangements for the past 2 months:  Charity fundraiser of Information:  Patient Patient Interpreter Needed:  None Criminal Activity/Legal Involvement Pertinent to Current Situation/Hospitalization:  No - Comment as needed Significant Relationships:  Siblings, Friend, Neighbor Lives with:  Self Do you feel safe going back to the place where you live?  Yes Need for family participation in patient care:  No (Coment)  Care giving concerns:  Patient lives at [facility]    Social Worker assessment / plan:  CSW consulted for facility placement. CSW met with patient to discuss disposition. CSW introduced self and explained role. Patient states that she lives at [place] living alone. Patient states that she expects to go to [facility] at discharge. CSW notified Seth Bake at [facility] that patient will need SNF at discharge and will need [facility] authorization. Seth Bake states that she will get [facility] on Monday since they are closed over the weekend. CSW will continue to follow for discharge planning.   Employment status:  Retired Nurse, adult PT Recommendations:  [place] / Referral to community resources:  [place]  Patient/Family's Response to care:  Patient thanked CSW for assistance   Patient/Family's Understanding of and Emotional Response to Diagnosis, Current Treatment,  and Prognosis: Patient states she understands current plan and is in agreement   Emotional Assessment Appearance:  Appears stated age Attitude/Demeanor/Rapport:    Affect (typically observed):  Accepting, Pleasant, Hopeful Orientation:  Oriented to Self, Oriented to Place, Oriented to  Time, Oriented to Situation Alcohol / Substance use:  Not Applicable Psych involvement (Current and /or in the community):  No (Comment)  Discharge Needs  Concerns to be addressed:  Discharge Planning Concerns Readmission within the last 30 days:  No Current discharge risk:  None Barriers to Discharge:  Continued Medical Work up   [employer], [place] 09/14/2017, 11:36 AM

## 2017-09-14 NOTE — Plan of Care (Signed)

## 2017-09-14 NOTE — Anesthesia Postprocedure Evaluation (Signed)
Anesthesia Post Note  Patient: Dorothy Church  Procedure(s) Performed: TOTAL KNEE REVISION (Left Knee)  Patient location during evaluation: Nursing Unit Anesthesia Type: Spinal Level of consciousness: oriented and awake and alert Pain management: pain level controlled Vital Signs Assessment: post-procedure vital signs reviewed and stable Respiratory status: spontaneous breathing Cardiovascular status: blood pressure returned to baseline and stable Postop Assessment: no headache, no backache, no apparent nausea or vomiting and patient able to bend at knees Anesthetic complications: no     Last Vitals:  Vitals:   09/14/17 0500 09/14/17 0733  BP: 102/60 (!) 95/59  Pulse: 80 91  Resp: 18 18  Temp: 36.8 C 36.6 C  SpO2: 92% 96%    Last Pain:  Vitals:   09/14/17 0843  TempSrc:   PainSc: 6                  Precious Haws [place]

## 2017-09-14 NOTE — Plan of Care (Signed)

## 2017-09-14 NOTE — Progress Notes (Signed)
Physical Therapy Treatment Patient Details Name: Dorothy Church MRN: [id number] DOB: 08-Jun-1935 Today's Date: 09/14/2017    History of Present Illness Dorothy Church is a 82yo female presenting on 7/27 POD1 s/p Left TKA revision. 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     PT Comments    BID treatment with focus on HEP education. Handout provided, reviewed with patient.Pt performing as directed with minimal cues needed, getting most detail from indepednent review of handout. Pt educated on use of gait belt for self assistance for heel slides as needed, which she has the most pain with but tolerating up to 70 degrees. BP remains soft. Pt agreeable to attempt performance of HEP again this evening. Pt progressing toward goals overall.   Follow Up Recommendations  SNF;Supervision for mobility/OOB     Equipment Recommendations  None recommended by PT    Recommendations for Other Services       Precautions / Restrictions Precautions Precautions: Fall Required Braces or Orthoses: Knee Immobilizer - Left Knee Immobilizer - Left: Discontinue once straight leg raise with < 10 degree lag(Able to do SLR .) Restrictions Weight Bearing Restrictions: Yes LLE Weight Bearing: Weight bearing as tolerated    Mobility  Bed Mobility Overal bed mobility: (deferred, PM session focus on HEP independence; pt justback to bed prior to entry)             General bed mobility comments: deferred, up in recliner at start and end of session  Transfers Overall transfer level: Needs assistance Equipment used: Rolling walker (2 wheeled) Transfers: Sit to/from [employer] Sit to Stand: Min assist;Min guard Stand pivot transfers: Min guard       General transfer comment: cues for foot placement, scooting to edge of chair, and hand placement with good carryover and improved independence with transfer  Ambulation/Gait                 Stairs              Wheelchair Mobility    Modified Rankin (Stroke Patients Only)       Balance Overall balance assessment: Needs assistance Sitting-balance support: Feet supported;No upper extremity supported Sitting balance-Leahy Scale: Good     Standing balance support: During functional activity;Single extremity supported;No upper extremity supported Standing balance-Leahy Scale: Fair                              Cognition Arousal/Alertness: Awake/alert Behavior During Therapy: WFL for tasks assessed/performed Overall Cognitive Status: Within Functional Limits for tasks assessed                                        Exercises Total Joint Exercises Ankle Circles/Pumps: AROM;15 reps;Both;Limitations Ankle Circles/Pumps Limitations: performed without physical assistance, used handout as teaching tool.  Quad Sets: AROM;Left;15 reps;Limitations Quad Sets Limitations: performed without physical assistance, used handout as teaching tool.  Short Arc Quad: Limitations;AROM;Left;10 reps Short Arc Quad Limitations: performed without physical assistance, used handout as teaching tool.  Heel Slides: AAROM;Left;10 reps;Limitations Heel Slides Limitations: performed without physical assistance, used handout as teaching tool. Self assisted with gait belt for last 5.  Hip ABduction/ADduction: AROM;Left;15 reps;Limitations Hip Abduction/Adduction Limitations: performed without physical assistance, used handout as teaching tool.  Straight Leg Raises: AROM;Left;10 reps Other Exercises Other Exercises: pt instructed in compression stocking mgt Other Exercises:  pt instructed in polar care mgt    General Comments General comments (skin integrity, edema, etc.): pt orthostatic with PT just prior to OT session, pt report feeling lightheaded with transfer to/from [facility], improved once back to recliner      Pertinent Vitals/Pain Pain Assessment: 0-10 Pain Score: 4  Pain Location:  Left knee (posterior)  Pain Descriptors / Indicators: Aching Pain Intervention(s): Limited activity within patient's tolerance;Monitored during session;Premedicated before session;Ice applied    Home Living Family/patient expects to be discharged to:: Private residence Living Arrangements: Alone Available Help at Discharge: Family;Friend(s);Available PRN/intermittently Type of Home: Independent living facility     Home Layout: One level Home Equipment: Walker - 2 wheels;Grab bars - tub/shower;Shower seat;Bedside commode Additional Comments: ILF at [facility]    Prior Function Level of Independence: Independent with assistive device(s)      Comments: recent RW use as recommended by ortho d/t deteriorating state of mobility, no assist required for ADL, driving, med mgt.    PT Goals (current goals can now be found in the care plan section) Acute Rehab PT Goals Patient Stated Goal: return to independent function as PTA PT Goal Formulation: With patient Time For Goal Achievement: 09/28/17 Potential to Achieve Goals: Good Progress towards PT goals: Progressing toward goals    Frequency    BID      PT Plan Current plan remains appropriate    Co-evaluation              AM-PAC PT "6 Clicks" Daily Activity  Outcome Measure  Difficulty turning over in bed (including adjusting bedclothes, sheets and blankets)?: None Difficulty moving from lying on back to sitting on the side of the bed? : None Difficulty sitting down on and standing up from a chair with arms (e.g., wheelchair, bedside commode, etc,.)?: Unable Help needed moving to and from a bed to chair (including a wheelchair)?: A Little Help needed walking in hospital room?: Total Help needed climbing 3-5 steps with a railing? : Total 6 Click Score: 14    End of Session Equipment Utilized During Treatment: Gait belt Activity Tolerance: Patient tolerated treatment well;Other (comment)(pt still hypotensive, just  returned tobed, and visitor in room) Patient left: with call bell/phone within reach;with SCD's reapplied;in bed;with family/visitor present Nurse Communication: Mobility status PT Visit Diagnosis: Difficulty in walking, not elsewhere classified (R26.2);Muscle weakness (generalized) (M62.81);Other abnormalities of gait and mobility (R26.89)     Time: 5638-7564 PT Time Calculation (min) (ACUTE ONLY): 22 min  Charges:  $Therapeutic Exercise: 8-22 mins                     2:04 PM, 09/14/17 Etta Grandchild, PT, DPT Physical Therapist - [facility]  [phone] ([place])     Aaditya Letizia C 09/14/2017, 2:02 PM

## 2017-09-14 NOTE — NC FL2 (Signed)
[place] LEVEL OF CARE SCREENING TOOL     IDENTIFICATION  Patient Name: Dorothy Church Birthdate: 10-Sep-1935 Sex: female Admission Date (Current Location): 09/13/2017  [place] and [place] Number:  Engineering geologist and Address:  [facility], [address], [place], [place] [address]      Provider Number: [id number]  Attending Physician Name and Address:  Dereck Leep, MD  Relative Name and Phone Number:  Ester Rink- sister [phone]    Current Level of Care: Hospital Recommended Level of Care: [place] Prior Approval Number:    Date Approved/Denied:   PASRR Number: [id number]  Discharge Plan: SNF    Current Diagnoses: Patient Active Problem List   Diagnosis Date Noted  . S/P total knee arthroplasty 09/13/2017  . Mechanical complication of knee prosthesis, initial encounter ([place]) 08/25/2017  . Barrett's esophagus 03/11/2017  . Depressive disorder, not elsewhere classified 03/11/2017  . Pure hypercholesterolemia 03/11/2017  . DDD (degenerative disc disease), lumbar 01/22/2017  . TIA (transient ischemic attack) 12/11/2016  . Asthma, chronic, severe persistent, uncomplicated 00/93/8182  . GOA (generalized osteoarthritis) 11/23/2015  . Joint pain of ankle and foot, right 05/17/2015  . Claudication of right lower extremity ([place]) 05/03/2015  . Generalized osteoarthritis of hand 05/03/2015  . OA (osteoarthritis) 04/04/2015  . Chronic obstructive airway disease with asthma ([place]) 11/10/2014  . History of colonic polyps 12/08/2013  . Hypersomnia with sleep apnea 04/23/2013  . Osteoporosis 10/07/2012  . Vitamin D deficiency   . Hypothyroidism   . Hyperlipidemia 08/07/2012  . Obstructive sleep apnea on CPAP 08/07/2012  . Asthma 08/07/2012  . Aortic stenosis 03/04/2012  . Impaired fasting glucose 03/04/2012    Orientation RESPIRATION BLADDER Height & Weight     Self, Time, Place, Situation  Normal  Continent Weight: 145 lb (65.8 kg) Height:  5\' 6"  (167.6 cm)  BEHAVIORAL SYMPTOMS/MOOD NEUROLOGICAL BOWEL NUTRITION STATUS  (none) (none) Continent Diet(Regular)  AMBULATORY STATUS COMMUNICATION OF NEEDS Skin   Extensive Assist Verbally Surgical wounds(Incision left knee)                       Personal Care Assistance Level of Assistance  Bathing, Feeding, Dressing Bathing Assistance: Limited assistance Feeding assistance: Independent Dressing Assistance: Limited assistance     Functional Limitations Info  Sight, Hearing, Speech Sight Info: Adequate Hearing Info: Adequate Speech Info: Adequate    SPECIAL CARE FACTORS FREQUENCY  PT (By licensed PT), OT (By licensed OT)     PT Frequency: 5 OT Frequency: 5            Contractures Contractures Info: Not present    Additional Factors Info  Code Status, Allergies Code Status Info: Full Code  Allergies Info:  Amoxicillin, Cefzil Cefprozil, Cephalexin, Propoxyphene, Relafen Nabumetone, Talwin Pentazocine, Penicillins           Current Medications (09/14/2017):  This is the current hospital active medication list Current Facility-Administered Medications  Medication Dose Route Frequency Provider Last Rate Last Dose  . 0.9 %  sodium chloride infusion   Intravenous Continuous Hooten, Laurice Record, MD 100 mL/hr at 09/13/17 1933    . acetaminophen (OFIRMEV) IV 1,000 mg  1,000 mg Intravenous Q6H Hooten, Laurice Record, MD   Stopped at 09/14/17 0543  . acetaminophen (TYLENOL) tablet 325-650 mg  325-650 mg Oral Q6H PRN Hooten, Laurice Record, MD      . acidophilus (RISAQUAD) capsule   Oral Daily Hooten, Laurice Record, MD   1  capsule at 09/14/17 0847  . albuterol (PROVENTIL) (2.5 MG/3ML) 0.083% nebulizer solution 3 mL  3 mL Inhalation Q6H PRN Hooten, Laurice Record, MD      . alum & mag hydroxide-simeth (MAALOX/MYLANTA) 200-200-20 MG/5ML suspension 30 mL  30 mL Oral Q4H PRN Hooten, Laurice Record, MD      . bisacodyl (DULCOLAX) suppository 10 mg  10 mg Rectal Daily  PRN Hooten, Laurice Record, MD      . celecoxib (CELEBREX) capsule 200 mg  200 mg Oral BID Dereck Leep, MD   200 mg at 09/14/17 0847  . clindamycin (CLEOCIN) IVPB 600 mg  600 mg Intravenous Q6H Hooten, Laurice Record, MD   Stopped at 09/14/17 [phone]  . diphenhydrAMINE (BENADRYL) 12.5 MG/5ML elixir 12.5-25 mg  12.5-25 mg Oral Q4H PRN Hooten, Laurice Record, MD      . enoxaparin (LOVENOX) injection 30 mg  30 mg Subcutaneous Q12H Hooten, Laurice Record, MD   30 mg at 09/14/17 0845  . ferrous sulfate tablet 325 mg  325 mg Oral BID WC Hooten, Laurice Record, MD   325 mg at 09/14/17 0847  . fluticasone (FLONASE) 50 MCG/ACT nasal spray 2 spray  2 spray Each Nare Daily PRN Hooten, Laurice Record, MD      . gabapentin (NEURONTIN) capsule 300 mg  300 mg Oral QHS Hooten, Laurice Record, MD   300 mg at 09/13/17 2203  . HYDROmorphone (DILAUDID) injection 0.5-1 mg  0.5-1 mg Intravenous Q4H PRN Hooten, Laurice Record, MD      . levothyroxine (SYNTHROID, LEVOTHROID) tablet 50 mcg  50 mcg Oral QAC breakfast Dereck Leep, MD   50 mcg at 09/14/17 0440  . magnesium hydroxide (MILK OF MAGNESIA) suspension 30 mL  30 mL Oral Daily Hooten, Laurice Record, MD   30 mL at 09/14/17 0845  . menthol-cetylpyridinium (CEPACOL) lozenge 3 mg  1 lozenge Oral PRN Hooten, Laurice Record, MD       Or  . phenol (CHLORASEPTIC) mouth spray 1 spray  1 spray Mouth/Throat PRN Hooten, Laurice Record, MD      . metoCLOPramide (REGLAN) tablet 5-10 mg  5-10 mg Oral Q8H PRN Hooten, Laurice Record, MD       Or  . metoCLOPramide (REGLAN) injection 5-10 mg  5-10 mg Intravenous Q8H PRN Hooten, Laurice Record, MD      . metoCLOPramide (REGLAN) tablet 10 mg  10 mg Oral TID AC & HS Hooten, Laurice Record, MD   10 mg at 09/14/17 0848  . mometasone-formoterol (DULERA) 200-5 MCG/ACT inhaler 2 puff  2 puff Inhalation BID Hooten, Laurice Record, MD   2 puff at 09/14/17 0849  . ondansetron (ZOFRAN) tablet 4 mg  4 mg Oral Q6H PRN Hooten, Laurice Record, MD       Or  . ondansetron (ZOFRAN) injection 4 mg  4 mg Intravenous Q6H PRN Hooten, Laurice Record, MD      .  oxyCODONE (Oxy IR/ROXICODONE) immediate release tablet 10 mg  10 mg Oral Q4H PRN Hooten, Laurice Record, MD      . oxyCODONE (Oxy IR/ROXICODONE) immediate release tablet 5 mg  5 mg Oral Q4H PRN Hooten, Laurice Record, MD   5 mg at 09/14/17 0440  . pantoprazole (PROTONIX) EC tablet 40 mg  40 mg Oral BID Dereck Leep, MD   40 mg at 09/14/17 0846  . PARoxetine (PAXIL) tablet 30 mg  30 mg Oral q morning - 10a Hooten, Laurice Record, MD   30 mg at 09/14/17 0846  .  polyvinyl alcohol (LIQUIFILM TEARS) 1.4 % ophthalmic solution 2 drop  2 drop Both Eyes BID PRN Hooten, Laurice Record, MD      . senna-docusate (Senokot-S) tablet 1 tablet  1 tablet Oral BID Hooten, Laurice Record, MD   1 tablet at 09/14/17 0848  . simvastatin (ZOCOR) tablet 20 mg  20 mg Oral QHS Hooten, Laurice Record, MD   20 mg at 09/13/17 2202  . sodium phosphate (FLEET) 7-19 GM/118ML enema 1 enema  1 enema Rectal Once PRN Hooten, Laurice Record, MD      . traMADol Veatrice Bourbon) tablet 50-100 mg  50-100 mg Oral Q4H PRN Dereck Leep, MD   100 mg at 09/14/17 0851  . [START ON 09/15/2017] Vitamin D (Ergocalciferol) (DRISDOL) capsule 50,000 Units  50,000 Units Oral Q Burgess Amor, Laurice Record, MD         Discharge Medications: Please see discharge summary for a list of discharge medications.  Relevant Imaging Results:  Relevant Lab Results:   Additional Information SSN [id number]  Annamaria Boots, [place]

## 2017-09-15 LAB — BASIC METABOLIC PANEL
Anion gap: 2 — ABNORMAL LOW (ref 5–15)
BUN: 17 mg/dL (ref 8–23)
CALCIUM: 7.5 mg/dL — AB (ref 8.9–10.3)
CO2: 28 mmol/L (ref 22–32)
Chloride: 109 mmol/L (ref 98–111)
Creatinine, Ser: 0.79 mg/dL (ref 0.44–1.00)
GFR calc Af Amer: >60 mL/min (ref >60)
GFR calc non Af Amer: >60 mL/min (ref >60)
GLUCOSE: 100 mg/dL — AB (ref 70–99)
Potassium: 3.9 mmol/L (ref 3.5–5.1)
SODIUM: 139 mmol/L (ref 135–145)

## 2017-09-15 LAB — CBC
HCT: 24.9 % — ABNORMAL LOW (ref 35.0–47.0)
Hemoglobin: 8.6 g/dL — ABNORMAL LOW (ref 12.0–16.0)
MCH: 31.3 pg (ref 26.0–34.0)
MCHC: 34.4 g/dL (ref 32.0–36.0)
MCV: 90.9 fL (ref 80.0–100.0)
PLATELETS: 158 10*3/uL (ref 150–440)
RBC: 2.74 MIL/uL — AB (ref 3.80–5.20)
RDW: 13.3 % (ref 11.5–14.5)
WBC: 6.8 10*3/uL (ref 3.6–11.0)

## 2017-09-15 MED ORDER — ENOXAPARIN SODIUM 30 MG/0.3ML ~~LOC~~ SOLN: 30.0000 mg | Freq: Two times a day (BID) | SUBCUTANEOUS | 0 refills | Status: DC

## 2017-09-15 MED ORDER — OXYCODONE HCL 5 MG PO TABS: 5.0000 mg | ORAL_TABLET | ORAL | 0 refills | Status: DC | PRN

## 2017-09-15 MED ORDER — TRAMADOL HCL 50 MG PO TABS: 50.0000 mg | ORAL_TABLET | ORAL | 0 refills | Status: DC | PRN

## 2017-09-15 MED ORDER — IPRATROPIUM-ALBUTEROL 0.5-2.5 (3) MG/3ML IN SOLN
3.0000 mL | Freq: Four times a day (QID) | RESPIRATORY_TRACT | Status: DC | PRN
  Administered 2017-09-15: 3 mL via RESPIRATORY_TRACT
  Filled 2017-09-15: qty 3

## 2017-09-15 MED ORDER — SODIUM CHLORIDE 0.9 % IV BOLUS
500.0000 mL | Freq: Once | INTRAVENOUS | Status: AC
  Administered 2017-09-15: 500 mL via INTRAVENOUS

## 2017-09-15 NOTE — Progress Notes (Signed)
   Subjective: 2 Days Post-Op Procedure(s) (LRB): TOTAL KNEE REVISION (Left) Patient reports pain as moderate.  Was noted to have significant pain during the night.  Better this morning. Patient is well, and has had no acute complaints or problems Patient did fair with physical therapy yesterday.  Ambulated bed to chair.  Some hypotension.  Range of motion 70 degrees of flexion Plan is to go Rehab after hospital stay. no nausea and no vomiting Patient denies any chest pains or shortness of breath. Objective: Vital signs in last 24 hours: Temp:  [97.4 F (36.3 C)-98.2 F (36.8 C)] 97.5 F (36.4 C) (07/28 0806) Pulse Rate:  [98-106] 98 (07/28 0806) Resp:  [17-18] 18 (07/28 0806) BP: (98-126)/(51-63) 98/53 (07/28 0806) SpO2:  [91 %-96 %] 91 % (07/28 0806) well approximated incision Heels are non tender and elevated off the bed using rolled towels Intake/Output from previous day: 07/27 0701 - 07/28 0700 In: 1981.3 [P.O.:480; I.V.:1301.3; IV Piggyback:200] Out: 890 [Urine:800; Drains:90] Intake/Output this shift: No intake/output data recorded.  Recent Labs    09/14/17 0515 09/15/17 0529  HGB 8.9* 8.6*   Recent Labs    09/14/17 0515 09/15/17 0529  WBC 8.1 6.8  RBC 2.84* 2.74*  HCT 25.6* 24.9*  PLT 158 158   Recent Labs    09/14/17 0515 09/15/17 0529  NA 139 139  K 4.0 3.9  CL 108 109  CO2 27 28  BUN 15 17  CREATININE 0.78 0.79  GLUCOSE 140* 100*  CALCIUM 7.0* 7.5*   No results for input(s): LABPT, INR in the last 72 hours.  EXAM General - Patient is Alert, Appropriate and Oriented Extremity - Neurologically intact Neurovascular intact Sensation intact distally Intact pulses distally Dorsiflexion/Plantar flexion intact No cellulitis present Compartment soft Dressing - dressing C/D/I Motor Function - intact, moving foot and toes well on exam.    Past Medical History:  Diagnosis Date  . Aortic valve disorders   . Arthritis   . Barrett's esophagus    . COPD (chronic obstructive pulmonary disease) ([place])   . GERD (gastroesophageal reflux disease)   . History of hiatal hernia   . Hypercholesteremia   . Hypothyroidism   . Impaired fasting glucose   . Intrinsic asthma, unspecified   . Obstructive sleep apnea (adult) (pediatric)   . Osteoporosis   . Pneumonia   . Pure hypercholesterolemia   . Syncope and collapse   . TIA (transient ischemic attack)   . Vitamin D deficiency     Assessment/Plan: 2 Days Post-Op Procedure(s) (LRB): TOTAL KNEE REVISION (Left) Active Problems:   S/P total knee arthroplasty  Estimated body mass index is 23.4 kg/m as calculated from the following:   Height as of this encounter: 5\' 6"  (1.676 m).   Weight as of this encounter: 65.8 kg (145 lb). Up with therapy Plan for discharge tomorrow Discharge to SNF once insurance is been approved  Labs: Hemoglobin 8.6 DVT Prophylaxis - Lovenox, Foot Pumps and TED hose Weight-Bearing as tolerated to left leg Patient needs bowel movement Hemovac discontinued today.  Ends of the drain appeared to be intact. IV bolus 500 Hemoglobin tomorrow morning  Babe Anthis R. [place] 09/15/2017, 8:16 AM

## 2017-09-15 NOTE — Discharge Summary (Signed)
Physician Discharge Summary  Patient ID: Dorothy Church MRN: [id number] DOB/AGE: [birth year] 82 y.o.  Admit date: 09/13/2017 Discharge date: 09/16/2017  Admission Diagnoses:  MECHANICAL COMPLICATIONS OF INTERNAL LEFT KNEE PROSTHESIS   Discharge Diagnoses: Patient Active Problem List   Diagnosis Date Noted  . S/P total knee arthroplasty 09/13/2017  . Mechanical complication of knee prosthesis, initial encounter ([place]) 08/25/2017  . Barrett's esophagus 03/11/2017  . Depressive disorder, not elsewhere classified 03/11/2017  . Pure hypercholesterolemia 03/11/2017  . DDD (degenerative disc disease), lumbar 01/22/2017  . TIA (transient ischemic attack) 12/11/2016  . Asthma, chronic, severe persistent, uncomplicated 44/02/270  . GOA (generalized osteoarthritis) 11/23/2015  . Joint pain of ankle and foot, right 05/17/2015  . Claudication of right lower extremity ([place]) 05/03/2015  . Generalized osteoarthritis of hand 05/03/2015  . OA (osteoarthritis) 04/04/2015  . Chronic obstructive airway disease with asthma ([place]) 11/10/2014  . History of colonic polyps 12/08/2013  . Hypersomnia with sleep apnea 04/23/2013  . Osteoporosis 10/07/2012  . Vitamin D deficiency   . Hypothyroidism   . Hyperlipidemia 08/07/2012  . Obstructive sleep apnea on CPAP 08/07/2012  . Asthma 08/07/2012  . Aortic stenosis 03/04/2012  . Impaired fasting glucose 03/04/2012    Past Medical History:  Diagnosis Date  . Aortic valve disorders   . Arthritis   . Barrett's esophagus   . COPD (chronic obstructive pulmonary disease) ([place])   . GERD (gastroesophageal reflux disease)   . History of hiatal hernia   . Hypercholesteremia   . Hypothyroidism   . Impaired fasting glucose   . Intrinsic asthma, unspecified   . Obstructive sleep apnea (adult) (pediatric)   . Osteoporosis   . Pneumonia   . Pure hypercholesterolemia   . Syncope and collapse   . TIA (transient ischemic attack)   . Vitamin D deficiency       Transfusion: 1 unit of packed RBCs was given postoperatively after the 500 mL IV bolus     Consultants (if any):   Discharged Condition: Improved  Hospital Course: Dorothy Church is an 82 y.o. 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 M.D.  ASSISTANT:  Vance Peper, PA (present and scrubbed throughout the case, critical for assistance with exposure, retraction, instrumentation, and closure)  ANESTHESIA: spinal and general  ESTIMATED BLOOD LOSS: 600 mL  FLUIDS REPLACED: 2300 mL of crystalloid  TOURNIQUET TIME: #1 - 120 minutes         #2 - 52 minuts  DRAINS: 2 medium Hemovac drains  IMPLANTS UTILIZED: DePuy Sigma TC3 3 size 3 posterior stabilized femoral component (cemented), 5 degree femoral adapter, +2 femoral adapter bolt, 40 mm universal femoral sleeve (full porous), 30 mm x 13 mm Sigma modular femoral stem, 8 mm posterior augments (medial and lateral), size 4 MBT revision tibial component (cemented), 29 mm MBT revision metaphyseal sleeve (porous), 16 mm x 75 mm universal tibial fluted stem, 38 mm 3 peg oval dome patella (cemented), and a 10 mm TC3 rotating platform polyethylene insert.  INDICATIONS FOR SURGERY: Dorothy Church is a  82 y.o. year old female who previously underwent a left total knee arthroplasty approximately 40 years ago in [place].  She was doing well with the exception of limited range of motion when she had the sudden onset of pain while ambulating.   X-rays demonstrated fracture of the polyethylene tibial component with areas of ostial lysis and polyethylene wear.  Recommendation was made for left total knee revision.  After discussion of the risks and benefits of surgical intervention, the patient expressed understanding of the risks benefits and agree with plans for total knee revision arthroplasty.   The risks, benefits, and alternatives were discussed at length including but not limited to the risks of infection, bleeding, nerve injury, stiffness, blood clots, the need for revision surgery, cardiopulmonary complications, among others, and they were willing to proceed.   Patient tolerated the surgery well. No complications .Patient was taken to PACU where she was stabilized and then transferred to the orthopedic floor.  Patient started on Lovenox 30 mg q 12 hrs. Foot pumps applied bilaterally at 80 mm hgb. Heels elevated off bed with rolled towels. No evidence of DVT. Calves non tender. Negative [place]. Physical therapy started on day #1 for gait training and transfer with OT starting on  day #1 for ADL and assisted devices. Patient has done well with therapy. Ambulated 60 feet upon being discharged.  Patient's IV And Foley were discontinued on day #1 with Hemovac being discontinued on day #2. Dressing was changed on day 2 prior to patient being discharged   She was given perioperative antibiotics:  Anti-infectives (From admission, onward)   Start     Dose/Rate Route Frequency Ordered Stop   09/13/17 2200  clindamycin (CLEOCIN) IVPB 600 mg     600 mg 100 mL/hr over 30 Minutes Intravenous Every 6 hours 09/13/17 1847 09/14/17 1835   09/13/17 1536  clindamycin (CLEOCIN) 900 MG/50ML IVPB    Note to Pharmacy:  Dorothy Church: cabinet override      09/13/17 1536 09/13/17 1547   09/13/17 0745  clindamycin (CLEOCIN) 900 MG/50ML IVPB    Note to Pharmacy:  Dorothy Church   : cabinet override      09/13/17 0745 09/13/17 0957   09/13/17  0600  clindamycin (CLEOCIN) IVPB 900 mg     900 mg 100 mL/hr over 30 Minutes Intravenous On call to O.R. 09/12/17 2211 09/13/17 1617    .  She was fitted with AV 1 compression foot pump devices, instructed on heel pumps, early ambulation, and fitted with TED stockings bilaterally for DVT prophylaxis.  She benefited maximally from the hospital stay and there were no complications.    Recent vital signs:  Vitals:   09/15/17 0029 09/15/17 0806  BP: 113/63 (!) 98/53  Pulse: (!) 106 98  Resp: 18 18  Temp: 98.2 F (36.8 C) (!) 97.5 F (36.4 C)  SpO2: 91% 91%    Recent laboratory studies:  Lab Results  Component Value Date   HGB 8.6 (L) 09/15/2017   HGB 8.9 (L) 09/14/2017   HGB 14.1 08/30/2017   Lab Results  Component Value Date   WBC 6.8 09/15/2017   PLT 158 09/15/2017   Lab Results  Component Value Date   INR 1.00 08/30/2017   Lab Results  Component Value Date   NA 139 09/15/2017   K 3.9 09/15/2017   CL 109 09/15/2017   CO2 28 09/15/2017   BUN 17 09/15/2017   CREATININE 0.79 09/15/2017   GLUCOSE 100 (H) 09/15/2017  Discharge Medications:   Allergies as of 09/15/2017      Reactions   Amoxicillin Other (See Comments)   Pencillins   Cefzil [cefprozil] Other (See Comments)   Unknown   Cephalexin Other (See Comments)   Unknown   Propoxyphene Other (See Comments)   Unknown   Relafen [nabumetone] Other (See Comments)   Unknown   Talwin [pentazocine] Other (See Comments)   Unknown   Penicillins Other (See Comments)   Cannot remember reaction      Medication List    STOP taking these medications   aspirin 81 MG EC tablet     TAKE these medications   acetaminophen 500 MG tablet Commonly known as:  TYLENOL Take 500-1,000 mg by mouth every 6 (six) hours as needed for moderate pain or headache.   albuterol 108 (90 Base) MCG/ACT inhaler Commonly known as:  VENTOLIN HFA Inhale 2 puffs into the lungs every 6 (six) hours as needed for wheezing.    budesonide-formoterol 160-4.5 MCG/ACT inhaler Commonly known as:  SYMBICORT Inhale 2 puffs into the lungs 2 (two) times daily. What changed:  when to take this   CULTURELLE PO Take 1 capsule by mouth daily.   diclofenac sodium 1 % Gel Commonly known as:  VOLTAREN Apply 4 g topically 4 (four) times daily. What changed:    when to take this  reasons to take this   diphenhydramine-acetaminophen 25-500 MG Tabs tablet Commonly known as:  TYLENOL PM Take 1 tablet by mouth at bedtime as needed (for sleep).   docusate sodium 50 MG capsule Commonly known as:  COLACE Take 50 mg by mouth daily.   enoxaparin 30 MG/0.3ML injection Commonly known as:  LOVENOX Inject 0.3 mLs (30 mg total) into the skin every 12 (twelve) hours. Start taking on:  09/17/2017   esomeprazole 20 MG capsule Commonly known as:  NEXIUM Take 20 mg by mouth daily.   levothyroxine 50 MCG tablet Commonly known as:  SYNTHROID, LEVOTHROID TAKE 1 TABLET BY MOUTH  DAILY BEFORE BREAKFAST What changed:    how much to take  how to take this  when to take this  additional instructions   mometasone 50 MCG/ACT nasal spray Commonly known as:  NASONEX Place 2 sprays into the nose as needed.   oxyCODONE 5 MG immediate release tablet Commonly known as:  Oxy IR/ROXICODONE Take 1 tablet (5 mg total) by mouth every 4 (four) hours as needed for moderate pain (pain score 4-6).   PARoxetine 30 MG tablet Commonly known as:  PAXIL Take 1 tablet (30 mg total) by mouth every morning.   REFRESH 1.4-0.6 % Soln Generic drug:  Polyvinyl Alcohol-Povidone PF Place 2 drops into both eyes 2 (two) times daily as needed (for dry eyes).   simvastatin 20 MG tablet Commonly known as:  ZOCOR Take 1 tablet (20 mg total) by mouth at bedtime.   traMADol 50 MG tablet Commonly known as:  ULTRAM Take 1-2 tablets (50-100 mg total) by mouth every 4 (four) hours as needed for moderate pain.   Vitamin D (Ergocalciferol) 50000 units Caps  capsule Commonly known as:  DRISDOL Take 1 capsule (50,000 Units total) by mouth every 7 (seven) days. What changed:  when to take this   ZICAM COLD REMEDY PO Take by mouth as needed.            Durable Medical Equipment  (From admission, onward)        Start     Ordered   09/13/17 1848  DME Walker rolling  Once    Question:  Patient needs a walker to treat with the following condition  Answer:  Total knee replacement status   09/13/17 1847   09/13/17 1848  DME Bedside commode  Once    Question:  Patient needs a bedside commode to treat with the following condition  Answer:  Total knee replacement status   09/13/17 1847      Diagnostic Studies: Dg Knee 1-2 Views Left  Result Date: 09/13/2017 CLINICAL DATA:  Intraoperative left knee radiograph for left knee prosthesis revision. EXAM: LEFT KNEE - 1-2 VIEW COMPARISON:  08/05/2017. FINDINGS: Knee prosthetic components have been removed. A portion of an intramedullary rod component has been inserted into the distal femur. This appears well centered on the single cross-table lateral view. Tip of the intramedullary rod component lies at an old, healed distal femoral fracture. No evidence of an acute fracture. IMPRESSION: Cross-table lateral operative left knee image for left knee prosthesis revision. Electronically Signed   By: Lajean Manes M.D.   On: 09/13/2017 13:11   Dg Knee Left Port  Result Date: 09/13/2017 CLINICAL DATA:  Status post knee replacement. EXAM: PORTABLE LEFT KNEE - 1-2 VIEW COMPARISON:  August 14, 2017 at 12:51 p.m. FINDINGS: Patient is status post knee replacement. New hardware is in good position. Surgical drains are noted. Postoperative soft tissue swelling and air. IMPRESSION: Status post left knee replacement as above. Deformity of the distal femur has the appearance of a prior remote healed fracture. Recommend clinical correlation. Electronically Signed   By: Dorise Bullion III M.D   On: 09/13/2017 17:43     Disposition:   Discharge Instructions    Increase activity slowly   Complete by:  As directed       Follow-up Information    Lattie Corns, PA-C On 09/27/2017.   Specialty:  Physician Assistant Why:  at 1:15pm Contact information: [place] [address] [phone]        Dereck Leep, MD On 11/12/2017.   Specialty:  Orthopedic Surgery Why:  at 10:15am Contact information: [place] [address] [phone]            Signed: Watt Climes 09/15/2017, 8:34 AM

## 2017-09-15 NOTE — Progress Notes (Signed)
Patient c/o wheezing and difficulty breathing. Spoke to Dr. Marry Guan who asked this nurse to put an order in for duoneb

## 2017-09-15 NOTE — Progress Notes (Signed)
Physical Therapy Treatment Patient Details Name: Dorothy Church MRN: [id number] DOB: [birth year] Today's Date: 09/15/2017    History of Present Illness Dorothy Church is a 82yo female presenting on 7/27 POD1 s/p Left TKA revision. 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     PT Comments    Pt was able to get exercises done this AM, reported to PT that she has slept poorly and will try to do more later.  Able to maneuver on the bed with RLE and trapeze and assisted to get more ROM to L Knee flexion.  Follow up later today to progress her to gait on RW as scheduled.   Follow Up Recommendations  SNF     Equipment Recommendations  None recommended by PT    Recommendations for Other Services       Precautions / Restrictions Precautions Precautions: Fall Required Braces or Orthoses: Knee Immobilizer - Left Knee Immobilizer - Left: (able to do SLR) Restrictions Weight Bearing Restrictions: Yes LLE Weight Bearing: Weight bearing as tolerated    Mobility  Bed Mobility               General bed mobility comments: declined to get OOB  Transfers                 General transfer comment: declined  Ambulation/Gait             General Gait Details: declined   Dorothy Church (Stroke Patients Only)       Balance                                            Cognition Arousal/Alertness: Awake/alert Behavior During Therapy: WFL for tasks assessed/performed Overall Cognitive Status: Within Functional Limits for tasks assessed                                        Exercises Total Joint Exercises Ankle Circles/Pumps: AROM;Both;5 reps Ankle Circles/Pumps Limitations: one minute hold Quad Sets: AROM;Both;10 reps Gluteal Sets: AROM;Both;10 reps Heel Slides: AAROM;AROM;Both;20 reps Heel Slides Limitations: pt was assisted to increase her function Hip  ABduction/ADduction: AROM;AAROM;Both;10 reps Straight Leg Raises: AROM;Left;10 reps    General Comments General comments (skin integrity, edema, etc.): has clean bandage on her L Knee      Pertinent Vitals/Pain Pain Assessment: 0-10 Pain Score: 5  Pain Location: L knee Pain Descriptors / Indicators: Operative site guarding;Sore Pain Intervention(s): Limited activity within patient's tolerance;Monitored during session;Premedicated before session;Repositioned;Ice applied    Home Living                      Prior Function            PT Goals (current goals can now be found in the care plan section) Acute Rehab PT Goals Patient Stated Goal: return to independent function as PTA Progress towards PT goals: Progressing toward goals    Frequency    BID      PT Plan Current plan remains appropriate    Co-evaluation              AM-PAC PT "6 Clicks" Daily Activity  Outcome Measure  Difficulty  turning over in bed (including adjusting bedclothes, sheets and blankets)?: None Difficulty moving from lying on back to sitting on the side of the bed? : None Difficulty sitting down on and standing up from a chair with arms (e.g., wheelchair, bedside commode, etc,.)?: None Help needed moving to and from a bed to chair (including a wheelchair)?: A Little Help needed walking in hospital room?: A Little Help needed climbing 3-5 steps with a railing? : A Lot 6 Click Score: 20    End of Session   Activity Tolerance: Patient tolerated treatment well;Patient limited by fatigue Patient left: in bed;with call bell/phone within reach Nurse Communication: Mobility status PT Visit Diagnosis: Difficulty in walking, not elsewhere classified (R26.2);Muscle weakness (generalized) (M62.81);Other abnormalities of gait and mobility (R26.89)     Time: 6438-3818 PT Time Calculation (min) (ACUTE ONLY): 19 min  Charges:  $Therapeutic Exercise: 8-22 mins                    Dorothy Church 09/15/2017, 2:02 PM   Dorothy Church, PT MS Acute Rehab Dept. Number: [place] and [place]

## 2017-09-15 NOTE — Progress Notes (Signed)
Physical Therapy Treatment Patient Details Name: Dorothy Church MRN: [id number] DOB: Jul 23, 1935 Today's Date: 09/15/2017    History of Present Illness Dorothy Church is a 82yo female presenting on 7/27 POD1 s/p Left TKA revision. 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     PT Comments    Pt is up to walk this afternoon with good effort and progressing as hoped with walker.  Her plan is still to go to SNF as she is having a lot of difficulty with pain and still requires to be assisted.  Will follow acutely for strength and ROM to knee, to increase standing stability and to improve quality of gait with her reduction in use of LLE.   Follow Up Recommendations  SNF     Equipment Recommendations  None recommended by PT    Recommendations for Other Services       Precautions / Restrictions Precautions Precautions: Fall Required Braces or Orthoses: (none needed now) Knee Immobilizer - Left: (able to do SLR) Restrictions Weight Bearing Restrictions: Yes LLE Weight Bearing: Weight bearing as tolerated    Mobility  Bed Mobility Overal bed mobility: Needs Assistance Bed Mobility: Supine to Sit;Sit to Supine     Supine to sit: Min guard Sit to supine: Min guard   General bed mobility comments: used R leg to lightly assist L hip to bed  Transfers Overall transfer level: Needs assistance Equipment used: Rolling walker (2 wheeled) Transfers: Sit to/from Stand Sit to Stand: Min guard         General transfer comment: used hands appropriately to assist from bedside  Ambulation/Gait Ambulation/Gait assistance: Min guard Gait Distance (Feet): 60 Feet Assistive device: Rolling walker (2 wheeled);1 person hand held assist Gait Pattern/deviations: Step-to pattern;Step-through pattern;Decreased stride length;Decreased weight shift to left;Narrow base of support Gait velocity: reduced Gait velocity interpretation: <1.8 ft/sec, indicate of risk for recurrent  falls General Gait Details: pt was more apt to use toes than full L foot to wb   Stairs             Wheelchair Mobility    Modified Rankin (Stroke Patients Only)       Balance Overall balance assessment: Needs assistance Sitting-balance support: Feet supported;Bilateral upper extremity supported Sitting balance-Leahy Scale: Good     Standing balance support: Bilateral upper extremity supported;During functional activity Standing balance-Leahy Scale: Fair                              Cognition Arousal/Alertness: Awake/alert Behavior During Therapy: WFL for tasks assessed/performed Overall Cognitive Status: Within Functional Limits for tasks assessed                                        Exercises Total Joint Exercises Ankle Circles/Pumps: AROM;Both;5 reps Ankle Circles/Pumps Limitations: one minute hold Quad Sets: AROM;Both;10 reps Gluteal Sets: AROM;Both;10 reps Heel Slides: AAROM;AROM;Both;20 reps Heel Slides Limitations: pt was assisted to increase her function Hip ABduction/ADduction: AROM;AAROM;Both;10 reps Straight Leg Raises: AROM;Left;10 reps    General Comments General comments (skin integrity, edema, etc.): has clean bandage on her L Knee      Pertinent Vitals/Pain Pain Assessment: 0-10 Pain Score: 3  Pain Location: L knee Pain Descriptors / Indicators: Operative site guarding;Sore Pain Intervention(s): Limited activity within patient's tolerance;Monitored during session;Premedicated before session;Repositioned;Ice applied    Home Living  Prior Function            PT Goals (current goals can now be found in the care plan section) Acute Rehab PT Goals Patient Stated Goal: to walk without getting dizzy over low BP PT Goal Formulation: With patient Progress towards PT goals: Progressing toward goals    Frequency    BID      PT Plan Current plan remains appropriate     Co-evaluation              AM-PAC PT "6 Clicks" Daily Activity  Outcome Measure  Difficulty turning over in bed (including adjusting bedclothes, sheets and blankets)?: None Difficulty moving from lying on back to sitting on the side of the bed? : None Difficulty sitting down on and standing up from a chair with arms (e.g., wheelchair, bedside commode, etc,.)?: None Help needed moving to and from a bed to chair (including a wheelchair)?: A Little Help needed walking in hospital room?: A Little Help needed climbing 3-5 steps with a railing? : A Little 6 Click Score: 21    End of Session Equipment Utilized During Treatment: Gait belt Activity Tolerance: Patient tolerated treatment well Patient left: in bed;with call bell/phone within reach;with bed alarm set Nurse Communication: Mobility status PT Visit Diagnosis: Difficulty in walking, not elsewhere classified (R26.2);Muscle weakness (generalized) (M62.81);Other abnormalities of gait and mobility (R26.89)     Time: 3832-9191 PT Time Calculation (min) (ACUTE ONLY): 19 min  Charges:  $Gait Training: 8-22 mins $Therapeutic Exercise: 8-22 mins                      Ramond Dial 09/15/2017, 2:09 PM   Mee Hives, PT MS Acute Rehab Dept. Number: [place] and [place]

## 2017-09-16 ENCOUNTER — Encounter: Payer: Self-pay | Admitting: Orthopedic Surgery

## 2017-09-16 LAB — PREPARE RBC (CROSSMATCH)

## 2017-09-16 LAB — HEMOGLOBIN AND HEMATOCRIT, BLOOD
HCT: 24.9 % — ABNORMAL LOW (ref 35.0–47.0)
HEMATOCRIT: 28.6 % — AB (ref 35.0–47.0)
HEMOGLOBIN: 9.8 g/dL — AB (ref 12.0–16.0)
Hemoglobin: 8.7 g/dL — ABNORMAL LOW (ref 12.0–16.0)

## 2017-09-16 MED ORDER — LACTULOSE 10 GM/15ML PO SOLN
10.0000 g | Freq: Two times a day (BID) | ORAL | Status: DC | PRN
  Filled 2017-09-16: qty 30

## 2017-09-16 MED ORDER — SODIUM CHLORIDE 0.9% IV SOLUTION
Freq: Once | INTRAVENOUS | Status: AC
  Administered 2017-09-16: 15:00:00 via INTRAVENOUS

## 2017-09-16 NOTE — Progress Notes (Signed)
Care assumed from [place], [place]. Pt sleeping with IVF infusing. Left lower extremity on bone foam.

## 2017-09-16 NOTE — Plan of Care (Signed)

## 2017-09-16 NOTE — Progress Notes (Signed)
Occupational Therapy Treatment Patient Details Name: Dorothy Church MRN: [id number] DOB: 1935/11/23 Today's Date: 09/16/2017    History of present illness Dorothy Church is a 82yo female presenting s/p Left TKA revision. 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    OT comments  Pt seen for OT tx this date. Pt complaining of dizziness and hallucinations today and t/o session. Pt receiving blood transfusion but requesting to use the bathroom. Pt required CGA for transfers with cues for hand/foot placement to improve safety, ambulated approx 3 feet to [facility]. Set up and supervision for lateral leaning hygiene and min assist for clothing mgt once in standing to complete donning of undergarments over hips. Pt assisted back to bed with min assist for BLE mgt. Pt reporting mild pain in L knee. Pt continues to be limited in functional mobility and ADL independence and safety secondary to pain, decreased strength/ROM in L knee, and complications of dizziness and hallucinations increasing pt's risk of falls. Continue to recommend STR.   Follow Up Recommendations  SNF;Other (comment)    Equipment Recommendations  Other (comment)(TBD)    Recommendations for Other Services      Precautions / Restrictions Precautions Precautions: Fall;Knee Restrictions Weight Bearing Restrictions: Yes LLE Weight Bearing: Weight bearing as tolerated       Mobility Bed Mobility Overal bed mobility: Needs Assistance Bed Mobility: Sit to Supine     Supine to sit: Min assist Sit to supine: Min assist     Transfers Overall transfer level: Needs assistance Equipment used: Rolling walker (2 wheeled) Transfers: Sit to/from Stand Sit to Stand: Min guard         General transfer comment: Held due to dizziness/hallucinations    Balance Overall balance assessment: Needs assistance Sitting-balance support: Feet supported;Bilateral upper extremity supported Sitting balance-Leahy Scale: Good      Standing balance support: Bilateral upper extremity supported;During functional activity Standing balance-Leahy Scale: Fair                             ADL either performed or assessed with clinical judgement   ADL Overall ADL's : Needs assistance/impaired                         Toilet Transfer: Min Systems developer Details (indicate cue type and reason): verbal cues for hand/foot placement to improve safety/independence Toileting- Clothing Manipulation and Hygiene: Sitting/lateral lean;Set up;Supervision/safety;Minimal assistance Toileting - Clothing Manipulation Details (indicate cue type and reason): lat lean and set up with supervision for hygiene and min assist in standing to support completing donning of undergarments over hips     Functional mobility during ADLs: Min guard;Rolling walker       Vision Baseline Vision/History: Wears glasses Wears Glasses: At all times Patient Visual Report: No change from baseline     Perception     Praxis      Cognition Arousal/Alertness: Awake/alert;Suspect due to medications(Pt continues with hallucinations/dizziness from medications) Behavior During Therapy: [facility] for tasks assessed/performed Overall Cognitive Status: Within Functional Limits for tasks assessed                                          Exercises    Shoulder Instructions       General Comments      Pertinent Vitals/ Pain  Pain Assessment: Faces Pain Score: ("hurts a little") Faces Pain Scale: Hurts a little bit Pain Location: L knee Pain Descriptors / Indicators: Operative site guarding;Sore Pain Intervention(s): Limited activity within patient's tolerance;Monitored during session;Repositioned;Ice applied  Home Living                                          Prior Functioning/Environment              Frequency  Min 1X/week        Progress Toward  Goals  OT Goals(current goals can now be found in the care plan section)  Progress towards OT goals: OT to reassess next treatment  Acute Rehab OT Goals Patient Stated Goal: to get better OT Goal Formulation: With patient Time For Goal Achievement: 09/28/17 Potential to Achieve Goals: Good  Plan Discharge plan remains appropriate;Frequency remains appropriate    Co-evaluation                 AM-PAC PT "6 Clicks" Daily Activity     Outcome Measure   Help from another person eating meals?: None Help from another person taking care of personal grooming?: None Help from another person toileting, which includes using toliet, bedpan, or urinal?: A Little Help from another person bathing (including washing, rinsing, drying)?: A Little Help from another person to put on and taking off regular upper body clothing?: None Help from another person to put on and taking off regular lower body clothing?: A Little 6 Click Score: 21    End of Session Equipment Utilized During Treatment: Gait belt;Rolling walker  OT Visit Diagnosis: Other abnormalities of gait and mobility (R26.89);Pain Pain - Right/Left: Left Pain - part of body: Knee   Activity Tolerance Patient tolerated treatment well;Other (comment)(limited 2/2 dizziness and hallucinations)   Patient Left in bed;with call bell/phone within reach;with bed alarm set;with nursing/sitter in room;with family/visitor present   Nurse Communication          Time: 8329-1916 OT Time Calculation (min): 16 min  Charges: OT General Charges $OT Visit: 1 Visit OT Treatments $Self Care/Home Management : 8-22 mins  Jeni Salles, MPH, MS, OTR/L ascom [phone] 09/16/17, 4:27 PM

## 2017-09-16 NOTE — Progress Notes (Signed)
Per Seth Bake admissions coordinator at [facility] SN[facility] patient can come to [facility] when medically stable. Per Seth Bake she has started [facility] authorization today. Per Seth Bake patient can D/C to [facility] with [place] pending because she is from [facility] independent living.   [employer], LCSW [phone]

## 2017-09-16 NOTE — Care Management Important Message (Signed)
Important Message  Patient Details  Name: Dorothy Church MRN: [id number] Date of Birth: 1935/05/21   Medicare Important Message Given:  Yes    Dorothy Church 09/16/2017, 11:37 AM

## 2017-09-16 NOTE — Progress Notes (Signed)
Physical Therapy Treatment Patient Details Name: Dorothy Church MRN: [id number] DOB: 02/15/1936 Today's Date: 09/16/2017    History of Present Illness [name] is a 82yo female presenting on 7/27 POD1 s/p Left TKA revision. 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     PT Comments    Pt agreeable to PT despite difficulties this morn due to pain medication side effects. Pt experiencing malaise, dizziness and hallucinations. Pt is aware of reality and able to hold conversation and follow instructions well. Pt works on L knee range of motion and low level strengthening. Range is significant decreased this session due to inability to tolerate as much work today primarily due to dizziness. Range of L knee 5-55 degrees. Encouraged exercises as able; increased assist as well for bed mobility. Continue PT to progress range, strength and endurance as able to progress functional mobility.    Follow Up Recommendations  SNF     Equipment Recommendations  None recommended by PT    Recommendations for Other Services       Precautions / Restrictions Precautions Precautions: Fall;Knee Restrictions Weight Bearing Restrictions: Yes LLE Weight Bearing: Weight bearing as tolerated    Mobility  Bed Mobility Overal bed mobility: Needs Assistance Bed Mobility: Supine to Sit;Sit to Supine     Supine to sit: Min assist Sit to supine: Min assist   General bed mobility comments: Min guard during sit due to dizziness and ill effects from medication  Transfers                 General transfer comment: Held due to dizziness/hallucinations  Ambulation/Gait                 Stairs             Wheelchair Mobility    Modified Rankin (Stroke Patients Only)       Balance                                            Cognition Arousal/Alertness: Awake/alert;Suspect due to medications(Pt notes hallucinations/dizziness from  medications) Behavior During Therapy: [facility] for tasks assessed/performed Overall Cognitive Status: Within Functional Limits for tasks assessed                                        Exercises Total Joint Exercises Ankle Circles/Pumps: AROM;Both;20 reps;Supine Quad Sets: Strengthening;Both;20 reps;Supine Knee Flexion: AROM;Left;10 reps;Seated;AAROM(self assists with RLE at times) Goniometric ROM: 5-55(Limited ability today due to not feeling well due to meds)    General Comments        Pertinent Vitals/Pain Pain Assessment: 0-10 Pain Score: (Minimal) Pain Location: L knee    Home Living                      Prior Function            PT Goals (current goals can now be found in the care plan section) Progress towards PT goals: Not progressing toward goals - comment(due to medicine complications)    Frequency    BID      PT Plan Current plan remains appropriate    Co-evaluation              AM-PAC PT "6 Clicks" Daily Activity  Outcome  Measure  Difficulty turning over in bed (including adjusting bedclothes, sheets and blankets)?: A Little Difficulty moving from lying on back to sitting on the side of the bed? : Unable Difficulty sitting down on and standing up from a chair with arms (e.g., wheelchair, bedside commode, etc,.)?: Unable Help needed moving to and from a bed to chair (including a wheelchair)?: A Lot Help needed walking in hospital room?: A Lot Help needed climbing 3-5 steps with a railing? : A Lot 6 Click Score: 11    End of Session   Activity Tolerance: Treatment limited secondary to medical complications (Comment);Other (comment)(dizzness/hallucinations ) Patient left: with call bell/phone within reach;with bed alarm set;in bed   PT Visit Diagnosis: Difficulty in walking, not elsewhere classified (R26.2);Muscle weakness (generalized) (M62.81);Other abnormalities of gait and mobility (R26.89)     Time: 6553-7482 PT  Time Calculation (min) (ACUTE ONLY): 27 min  Charges:  $Therapeutic Exercise: 8-22 mins $Therapeutic Activity: 8-22 mins                      Larae Grooms, PTA 09/16/2017, 12:43 PM

## 2017-09-16 NOTE — Progress Notes (Signed)
Subjective: 3 Days Post-Op Procedure(s) (LRB): TOTAL KNEE REVISION (Left) Patient reports pain as moderate.   Patient is well, and has had no acute complaints or problems Did well with physical therapy yesterday.  Was able to ambulate 60 feet.  No record of range of motion from the physical therapy Patient was given Dilaudid last night for pain and subsequently this morning states that she does not feel good and extremely drowsy.  "I do not know what they gave me". Patient does have a little shortness of breath which she states is not uncommon.  She has asked for her inhaler. Plan is to go Rehab after hospital stay. no nausea and no vomiting Patient denies any chest pains   Patient has IV running.  Orders yesterday were for IV bolus. No vital signs since 10:00 last evening.  Had a pulse rate of 111 BP of 119/52  Objective: Vital signs in last 24 hours: Temp:  [97.5 F (36.4 C)-98.4 F (36.9 C)] 98.3 F (36.8 C) (07/28 2045) Pulse Rate:  [98-111] 111 (07/28 2045) Resp:  [16-18] 16 (07/28 2039) BP: (98-133)/(52-58) 119/52 (07/28 2045) SpO2:  [88 %-94 %] 93 % (07/28 2045) well approximated incision Heels are non tender and elevated off the bed using rolled towels Intake/Output from previous day: 07/28 0701 - 07/29 0700 In: 130 [P.O.:130] Out: 350 [Urine:350] Intake/Output this shift: Total I/O In: -  Out: 200 [Urine:200]  Recent Labs    09/14/17 0515 09/15/17 0529 09/16/17 0347  HGB 8.9* 8.6* 8.7*   Recent Labs    09/14/17 0515 09/15/17 0529 09/16/17 0347  WBC 8.1 6.8  --   RBC 2.84* 2.74*  --   HCT 25.6* 24.9* 24.9*  PLT 158 158  --    Recent Labs    09/14/17 0515 09/15/17 0529  NA 139 139  K 4.0 3.9  CL 108 109  CO2 27 28  BUN 15 17  CREATININE 0.78 0.79  GLUCOSE 140* 100*  CALCIUM 7.0* 7.5*   No results for input(s): LABPT, INR in the last 72 hours.  EXAM General - Patient is Alert, Appropriate and Oriented Extremity - Neurologically  intact Neurovascular intact Sensation intact distally Intact pulses distally Dorsiflexion/Plantar flexion intact No cellulitis present Compartment soft Dressing - dressing C/D/I Motor Function - intact, moving foot and toes well on exam.    Past Medical History:  Diagnosis Date  . Aortic valve disorders   . Arthritis   . Barrett's esophagus   . COPD (chronic obstructive pulmonary disease) ([place])   . GERD (gastroesophageal reflux disease)   . History of hiatal hernia   . Hypercholesteremia   . Hypothyroidism   . Impaired fasting glucose   . Intrinsic asthma, unspecified   . Obstructive sleep apnea (adult) (pediatric)   . Osteoporosis   . Pneumonia   . Pure hypercholesterolemia   . Syncope and collapse   . TIA (transient ischemic attack)   . Vitamin D deficiency     Assessment/Plan: 3 Days Post-Op Procedure(s) (LRB): TOTAL KNEE REVISION (Left) Active Problems:   S/P total knee arthroplasty  Estimated body mass index is 23.4 kg/m as calculated from the following:   Height as of this encounter: 5\' 6"  (1.676 m).   Weight as of this encounter: 65.8 kg (145 lb). Up with therapy Discharge to SNF.  Not medically cleared yet since she has not had a bowel movement, urine output is down as well as hypotension.  Labs: Hemoglobin 8.7 up from 8.6. DVT  Prophylaxis - Lovenox, Foot Pumps and TED hose Weight-Bearing as tolerated to left leg DC IV. Patient needs bowel movement.  Will order lactulose Encourage fluid intake.  Urine output 300 down from 800 yesterday.  Jillyn Ledger. [place] 09/16/2017, 6:46 AM

## 2017-09-16 NOTE — Progress Notes (Signed)
Physical Therapy Treatment Patient Details Name: Dorothy Church MRN: [id number] DOB: 1935/07/24 Today's Date: 09/16/2017    History of Present Illness Dorothy Church is a 82yo female presenting on 7/27 POD1 s/p Left TKA revision. 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     PT Comments    Pt up in chair within the last half hour; pt continues to complains of dizziness and hallucinations (seeing and hearing things that are not there). Pt continues with mild pain, but due to other adverse symptoms is limited in what pt is able to participate in today. Pt performs active and active assisted LLE limited exercises and stretching. Continue PT to progress range, strength to improve all functional mobility.    Follow Up Recommendations  SNF     Equipment Recommendations  None recommended by PT    Recommendations for Other Services       Precautions / Restrictions Precautions Precautions: Fall;Knee Restrictions Weight Bearing Restrictions: Yes LLE Weight Bearing: Weight bearing as tolerated    Mobility  Bed Mobility Overal bed mobility: Needs Assistance Bed Mobility: Supine to Sit;Sit to Supine     Supine to sit: Min assist Sit to supine: Min assist   General bed mobility comments: Not tested; up in chair  Transfers                 General transfer comment: Held due to dizziness/hallucinations  Ambulation/Gait                 Stairs             Wheelchair Mobility    Modified Rankin (Stroke Patients Only)       Balance                                            Cognition Arousal/Alertness: Awake/alert;Suspect due to medications(Pt continues with hallucinations/dizziness from medications) Behavior During Therapy: [facility] for tasks assessed/performed Overall Cognitive Status: Within Functional Limits for tasks assessed                                        Exercises Total Joint  Exercises Ankle Circles/Pumps: AROM;Both;20 reps;Supine Quad Sets: Strengthening;Both;20 reps;Supine Long Arc Quad: AAROM;20 reps;Seated;Left Knee Flexion: AROM;Left;10 reps;Seated;AAROM(self assists with RLE at times) Goniometric ROM: 5-55(Limited ability today due to not feeling well due to meds)    General Comments        Pertinent Vitals/Pain Pain Assessment: 0-10 Pain Score: ("hurts a little") Pain Location: L knee    Home Living                      Prior Function            PT Goals (current goals can now be found in the care plan section) Progress towards PT goals: Not progressing toward goals - comment(due to medication complications)    Frequency    BID      PT Plan Current plan remains appropriate    Co-evaluation              AM-PAC PT "6 Clicks" Daily Activity  Outcome Measure  Difficulty turning over in bed (including adjusting bedclothes, sheets and blankets)?: A Little Difficulty moving from lying on back to sitting on the side  of the bed? : Unable Difficulty sitting down on and standing up from a chair with arms (e.g., wheelchair, bedside commode, etc,.)?: Unable Help needed moving to and from a bed to chair (including a wheelchair)?: A Lot Help needed walking in hospital room?: A Lot Help needed climbing 3-5 steps with a railing? : A Lot 6 Click Score: 11    End of Session   Activity Tolerance: Treatment limited secondary to medical complications (Comment);Other (comment)(dizzness/hallucinations ) Patient left: with call bell/phone within reach;in chair;with chair alarm set;Other (comment)(polar care in place)   PT Visit Diagnosis: Difficulty in walking, not elsewhere classified (R26.2);Muscle weakness (generalized) (M62.81);Other abnormalities of gait and mobility (R26.89)     Time: 2583-4621 PT Time Calculation (min) (ACUTE ONLY): 20 min  Charges:  $Therapeutic Exercise: 8-22 mins $Therapeutic Activity: 8-22 mins                       Larae Grooms, PTA 09/16/2017, 1:26 PM

## 2017-09-17 DIAGNOSIS — T84063A Wear of articular bearing surface of internal prosthetic left knee joint, initial encounter: Secondary | ICD-10-CM | POA: Diagnosis not present

## 2017-09-17 DIAGNOSIS — J449 Chronic obstructive pulmonary disease, unspecified: Secondary | ICD-10-CM | POA: Diagnosis not present

## 2017-09-17 DIAGNOSIS — E785 Hyperlipidemia, unspecified: Secondary | ICD-10-CM | POA: Diagnosis not present

## 2017-09-17 DIAGNOSIS — G4700 Insomnia, unspecified: Secondary | ICD-10-CM | POA: Diagnosis not present

## 2017-09-17 DIAGNOSIS — R69 Illness, unspecified: Secondary | ICD-10-CM | POA: Diagnosis not present

## 2017-09-17 DIAGNOSIS — Z741 Need for assistance with personal care: Secondary | ICD-10-CM | POA: Diagnosis not present

## 2017-09-17 DIAGNOSIS — F39 Unspecified mood [affective] disorder: Secondary | ICD-10-CM

## 2017-09-17 DIAGNOSIS — M6281 Muscle weakness (generalized): Secondary | ICD-10-CM | POA: Diagnosis not present

## 2017-09-17 DIAGNOSIS — R262 Difficulty in walking, not elsewhere classified: Secondary | ICD-10-CM | POA: Diagnosis not present

## 2017-09-17 DIAGNOSIS — R278 Other lack of coordination: Secondary | ICD-10-CM | POA: Diagnosis not present

## 2017-09-17 DIAGNOSIS — E039 Hypothyroidism, unspecified: Secondary | ICD-10-CM | POA: Diagnosis not present

## 2017-09-17 DIAGNOSIS — Z7401 Bed confinement status: Secondary | ICD-10-CM | POA: Diagnosis not present

## 2017-09-17 DIAGNOSIS — Z96652 Presence of left artificial knee joint: Secondary | ICD-10-CM | POA: Diagnosis not present

## 2017-09-17 DIAGNOSIS — R2681 Unsteadiness on feet: Secondary | ICD-10-CM | POA: Diagnosis not present

## 2017-09-17 DIAGNOSIS — F419 Anxiety disorder, unspecified: Secondary | ICD-10-CM | POA: Diagnosis not present

## 2017-09-17 DIAGNOSIS — M172 Bilateral post-traumatic osteoarthritis of knee: Secondary | ICD-10-CM | POA: Diagnosis not present

## 2017-09-17 DIAGNOSIS — K219 Gastro-esophageal reflux disease without esophagitis: Secondary | ICD-10-CM | POA: Diagnosis not present

## 2017-09-17 DIAGNOSIS — M1991 Primary osteoarthritis, unspecified site: Secondary | ICD-10-CM | POA: Diagnosis not present

## 2017-09-17 LAB — TYPE AND SCREEN
ABO/RH(D): A POS
ANTIBODY SCREEN: NEGATIVE
UNIT DIVISION: 0

## 2017-09-17 LAB — BPAM RBC
BLOOD PRODUCT EXPIRATION DATE: 201908292359
ISSUE DATE / TIME: 201907291422
Unit Type and Rh: 6200

## 2017-09-17 NOTE — Progress Notes (Signed)
Late entry: On 09/15/17 patient was tearful and reported pain 8/10 to left surgical knee. Patient was medicated with dilaudid and reported significant pain relief and was noted able to rest.

## 2017-09-17 NOTE — Progress Notes (Signed)
Patient ready for discharge to [facility]. Report called to Denyse Amass, Therapist, sports. EMS called for transport. pts IV removed and pt understands discharge.

## 2017-09-17 NOTE — Progress Notes (Addendum)
Patient is medically stable for D/C to [facility] today. Per Seth Bake admissions coordinator at [facility] patient can come today to room 323 with [facility] authorization pending. RN will call report at [phone] and arrange EMS for transport. Clinical Education officer, museum (CSW) sent D/C orders to [facility] via [place]. Patient is aware of above. Patient will bring her own cpap to [facility]. CSW left patient's sister Dorothy Church a Advertising account executive. Please reconsult if future social work needs arise. CSW signing off.   Patient's sister Dorothy Church called CSW back and is aware of D/C today.   [employer], LCSW [phone]

## 2017-09-17 NOTE — Clinical Social Work Placement (Addendum)
   CLINICAL SOCIAL WORK PLACEMENT  NOTE  Date:  09/17/2017  Patient Details  Name: Dorothy Church MRN: [id number] Date of Birth: Jun 07, 1935  Clinical Social Work is seeking post-discharge placement for this patient at the [place] level of care (*CSW will initial, date and re-position this form in  chart as items are completed):  Yes   Patient/family provided with [place] Work Department's list of facilities offering this level of care within the geographic area requested by the patient (or if unable, by the patient's family).  Yes   Patient/family informed of their freedom to choose among providers that offer the needed level of care, that participate in Medicare, Medicaid or managed care program needed by the patient, have an available bed and are willing to accept the patient.  Yes   Patient/family informed of [place] [facility]'s ownership interest in [facility] and [facility], as well as of the fact that they are under no obligation to receive care at these facilities.  PASRR submitted to EDS on 09/14/17     PASRR number received on 09/14/17     Existing PASRR number confirmed on       FL2 transmitted to all facilities in geographic area requested by pt/family on 09/14/17     FL2 transmitted to all facilities within larger geographic area on       Patient informed that his/her managed care company has contracts with or will negotiate with certain facilities, including the following:        Yes   Patient/family informed of bed offers received.  Patient chooses bed at [facility] )     Physician recommends and patient chooses bed at      Patient to be transferred to [facility] ) on 09/17/17.  Patient to be transferred to facility by [facility] )     Patient family notified on 09/17/17 of transfer.  Name of family member notified:  (CSW left patient's sister Hoyle Sauer a Advertising account executive. ) Patient's sister Hoyle Sauer called CSW back  and is aware of D/C today.    PHYSICIAN       Additional Comment:    _______________________________________________ Harwood Nall, Veronia Beets, LCSW 09/17/2017, 9:06 AM

## 2017-09-17 NOTE — Progress Notes (Signed)
   Subjective: 4 Days Post-Op Procedure(s) (LRB): TOTAL KNEE REVISION (Left) Patient reports pain as mild.   Patient is well, and has had no acute complaints or problems Not able to do a lot yesterday with physical therapy.  Losing range of motion.  She is now down to 50. Plan is to go Rehab after hospital stay. no nausea and no vomiting Patient denies any chest pains or shortness of breath. Still having some problems with hallucination and not feeling good.  Did not have any oxycodone yesterday.  Had one tramadol yesterday.  Objective: Vital signs in last 24 hours: Temp:  [97.9 F (36.6 C)-98.8 F (37.1 C)] 98.8 F (37.1 C) (07/30 0415) Pulse Rate:  [103-115] 111 (07/30 0415) Resp:  [16-20] 18 (07/30 0415) BP: (101-143)/(48-67) 133/67 (07/30 0415) SpO2:  [92 %-97 %] 94 % (07/29 2035) well approximated incision Heels are non tender and elevated off the bed using rolled towels Intake/Output from previous day: 07/29 0701 - 07/30 0700 In: 1215.6 [P.O.:820; I.V.:50; Blood:345.6] Out: 200 [Urine:200] Intake/Output this shift: Total I/O In: 240 [P.O.:240] Out: -   Recent Labs    09/15/17 0529 09/16/17 0347 09/16/17 1742  HGB 8.6* 8.7* 9.8*   Recent Labs    09/15/17 0529 09/16/17 0347 09/16/17 1742  WBC 6.8  --   --   RBC 2.74*  --   --   HCT 24.9* 24.9* 28.6*  PLT 158  --   --    Recent Labs    09/15/17 0529  NA 139  K 3.9  CL 109  CO2 28  BUN 17  CREATININE 0.79  GLUCOSE 100*  CALCIUM 7.5*   No results for input(s): LABPT, INR in the last 72 hours.  EXAM General - Patient is Alert, Appropriate and Oriented Extremity - Neurologically intact Neurovascular intact Sensation intact distally Intact pulses distally Dorsiflexion/Plantar flexion intact No cellulitis present Compartment soft Dressing - dressing C/D/I Motor Function - intact, moving foot and toes well on exam.    Past Medical History:  Diagnosis Date  . Aortic valve disorders   .  Arthritis   . Barrett's esophagus   . COPD (chronic obstructive pulmonary disease) ([place])   . GERD (gastroesophageal reflux disease)   . History of hiatal hernia   . Hypercholesteremia   . Hypothyroidism   . Impaired fasting glucose   . Intrinsic asthma, unspecified   . Obstructive sleep apnea (adult) (pediatric)   . Osteoporosis   . Pneumonia   . Pure hypercholesterolemia   . Syncope and collapse   . TIA (transient ischemic attack)   . Vitamin D deficiency     Assessment/Plan: 4 Days Post-Op Procedure(s) (LRB): TOTAL KNEE REVISION (Left) Active Problems:   S/P total knee arthroplasty  Estimated body mass index is 23.4 kg/m as calculated from the following:   Height as of this encounter: 5\' 6"  (1.676 m).   Weight as of this encounter: 65.8 kg (145 lb). Up with therapy Discharge to SNF  Labs: Hemoglobin 9.8 after 1 unit of packed RBCs DVT Prophylaxis - Lovenox, Foot Pumps and TED hose Weight-Bearing as tolerated to left leg    Nikodem Leadbetter R. [place] 09/17/2017, 6:51 AM

## 2017-09-18 LAB — AEROBIC/ANAEROBIC CULTURE (SURGICAL/DEEP WOUND): CULTURE: NO GROWTH

## 2017-09-18 LAB — AEROBIC/ANAEROBIC CULTURE W GRAM STAIN (SURGICAL/DEEP WOUND)
Culture: NO GROWTH
Culture: NO GROWTH

## 2017-09-30 ENCOUNTER — Ambulatory Visit: Payer: Medicare HMO | Admitting: Family Medicine

## 2017-09-30 DIAGNOSIS — R278 Other lack of coordination: Secondary | ICD-10-CM | POA: Diagnosis not present

## 2017-09-30 DIAGNOSIS — M1991 Primary osteoarthritis, unspecified site: Secondary | ICD-10-CM | POA: Diagnosis not present

## 2017-09-30 DIAGNOSIS — Z741 Need for assistance with personal care: Secondary | ICD-10-CM | POA: Diagnosis not present

## 2017-09-30 DIAGNOSIS — R69 Illness, unspecified: Secondary | ICD-10-CM | POA: Diagnosis not present

## 2017-09-30 DIAGNOSIS — Z96652 Presence of left artificial knee joint: Secondary | ICD-10-CM | POA: Diagnosis not present

## 2017-09-30 DIAGNOSIS — R262 Difficulty in walking, not elsewhere classified: Secondary | ICD-10-CM | POA: Diagnosis not present

## 2017-10-02 ENCOUNTER — Ambulatory Visit (INDEPENDENT_AMBULATORY_CARE_PROVIDER_SITE_OTHER): Payer: Medicare HMO | Admitting: Family Medicine

## 2017-10-02 ENCOUNTER — Encounter: Payer: Self-pay | Admitting: Family Medicine

## 2017-10-02 VITALS — BP 112/58 | HR 93 | Temp 98.3°F | Ht 66.0 in | Wt 142.0 lb

## 2017-10-02 DIAGNOSIS — G4701 Insomnia due to medical condition: Secondary | ICD-10-CM | POA: Diagnosis not present

## 2017-10-02 DIAGNOSIS — D6489 Other specified anemias: Secondary | ICD-10-CM

## 2017-10-02 DIAGNOSIS — Z96652 Presence of left artificial knee joint: Secondary | ICD-10-CM | POA: Diagnosis not present

## 2017-10-02 NOTE — Patient Instructions (Addendum)
Please schedule a lab only visit for 2 weeks

## 2017-10-02 NOTE — Progress Notes (Signed)
Subjective:    Patient ID: Dorothy Church, female    DOB: June 07, 1935, 82 y.o.   MRN: [id number]  HPI This is a 82 yo female who presents today for follow up of left TKR which was 09/13/17. Did ok, needed a blood transfusion. Had hallucinations following pain medication and something for sleep. Pain well controlled, good BMs. Has been home for 1 week, doing out patient PT.  Feels anxious and fatigued at night. Has been taking Tylenol PM with good results. No relief with melatonin.   Past Medical History:  Diagnosis Date  . Aortic valve disorders   . Arthritis   . Barrett's esophagus   . COPD (chronic obstructive pulmonary disease) ([place])   . GERD (gastroesophageal reflux disease)   . History of hiatal hernia   . Hypercholesteremia   . Hypothyroidism   . Impaired fasting glucose   . Intrinsic asthma, unspecified   . Obstructive sleep apnea (adult) (pediatric)   . Osteoporosis   . Pneumonia   . Pure hypercholesterolemia   . Syncope and collapse   . TIA (transient ischemic attack)   . Vitamin D deficiency    Past Surgical History:  Procedure Laterality Date  . BREAST EXCISIONAL BIOPSY Left   . COLONOSCOPY    . COLONOSCOPY WITH PROPOFOL N/A 12/14/2014   Procedure: COLONOSCOPY WITH PROPOFOL;  Surgeon: Lollie Sails, MD;  Location: [facility] ENDOSCOPY;  Service: Endoscopy;  Laterality: N/A;  . ESOPHAGOGASTRODUODENOSCOPY (EGD) WITH PROPOFOL N/A 12/14/2014   Procedure: ESOPHAGOGASTRODUODENOSCOPY (EGD) WITH PROPOFOL;  Surgeon: Lollie Sails, MD;  Location[facility] ENDOSCOPY;  Service: Endoscopy;  Laterality: N/A;  . ESOPHAGOGASTRODUODENOSCOPY (EGD) WITH PROPOFOL N/A 10/18/2016   Procedure: ESOPHAGOGASTRODUODENOSCOPY (EGD) WITH PROPOFOL;  Surgeon: Lollie Sails, MD;  Location: [facility] ENDOSCOPY;  Service: Endoscopy;  Laterality: N/A;  . EYE SURGERY    . HIP SURGERY Left    pin and plate  . prosthesis eye implant    . REPLACEMENT UNICONDYLAR JOINT KNEE Left   . TONSILLECTOMY    .  TOTAL KNEE REVISION Left 09/13/2017   Procedure: TOTAL KNEE REVISION;  Surgeon: Dereck Leep, MD;  Location: [facility] ORS;  Service: Orthopedics;  Laterality: Left;   Family History  Problem Relation Age of Onset  . Heart disease Father   . Alcohol abuse Father   . Breast cancer Mother   . Bipolar disorder Brother    Social History   Tobacco Use  . Smoking status: Never Smoker  . Smokeless tobacco: Never Used  Substance Use Topics  . Alcohol use: Yes    Alcohol/week: 1.0 standard drinks    Types: 1 Glasses of wine per week    Comment: occas. wine  . Drug use: No      Review of Systems Per HPI    Objective:   Physical Exam  Constitutional: She appears well-developed and well-nourished. No distress.  Eyes: Conjunctivae are normal.  Cardiovascular: Normal rate.  Pulmonary/Chest: Effort normal.  Musculoskeletal: She exhibits edema.  Ambulating with walker, steady gait. Left knee with generalized swelling, intact steri strips. No drainage or erythema of suture site.   Skin: Skin is warm and dry. She is not diaphoretic.  Psychiatric: She has a normal mood and affect. Her behavior is normal. Judgment and thought content normal.  Vitals reviewed.     BP (!) 112/58 (BP Location: Right Arm, Patient Position: Sitting, Cuff Size: Normal)   Pulse 93   Temp 98.3 F (36.8 C) (Oral)   Ht 5\' 6"  (1.676  m)   Wt 142 lb (64.4 kg)   SpO2 97%   BMI 22.92 kg/m  Wt Readings from Last 3 Encounters:  10/02/17 142 lb (64.4 kg)  09/13/17 145 lb (65.8 kg)  08/30/17 145 lb 11.2 oz (66.1 kg)       Assessment & Plan:  1. Anemia due to other cause, not classified - s/p left TKR with blood transfusion, will recheck CBC in 2 weeks - CBC with Differential; Future  2. S/P TKR (total knee replacement), left - doing well, little pain, tolerating PT  3. Insomnia due to medical condition - seem situational, although first choice of treatment not diphenhydramine, she is tolerating with out  side effects and I am not sure what caused her hallucinations - discussed short term use of diphenhydramine for sleep - she will let me know if sleep not improved in next couple of weeks  - follow up as scheduled in 2 months   Clarene Reamer, FNP-BC  [facility] at [facility], [place]  10/02/2017 4:32 PM

## 2017-10-15 ENCOUNTER — Encounter: Payer: Self-pay | Admitting: Family Medicine

## 2017-10-16 ENCOUNTER — Other Ambulatory Visit (INDEPENDENT_AMBULATORY_CARE_PROVIDER_SITE_OTHER): Payer: Medicare HMO

## 2017-10-16 DIAGNOSIS — D6489 Other specified anemias: Secondary | ICD-10-CM

## 2017-10-16 LAB — CBC WITH DIFFERENTIAL/PLATELET
Basophils Absolute: 0 10*3/uL (ref 0.0–0.1)
Basophils Relative: 0.7 % (ref 0.0–3.0)
EOS PCT: 3.5 % (ref 0.0–5.0)
Eosinophils Absolute: 0.2 10*3/uL (ref 0.0–0.7)
HEMATOCRIT: 35.2 % — AB (ref 36.0–46.0)
HEMOGLOBIN: 11.9 g/dL — AB (ref 12.0–15.0)
LYMPHS PCT: 24.1 % (ref 12.0–46.0)
Lymphs Abs: 1.2 10*3/uL (ref 0.7–4.0)
MCHC: 33.7 g/dL (ref 30.0–36.0)
MCV: 87.8 fl (ref 78.0–100.0)
MONO ABS: 0.6 10*3/uL (ref 0.1–1.0)
Monocytes Relative: 11.5 % (ref 3.0–12.0)
Neutro Abs: 3.1 10*3/uL (ref 1.4–7.7)
Neutrophils Relative %: 60.2 % (ref 43.0–77.0)
Platelets: 264 10*3/uL (ref 150.0–400.0)
RBC: 4.01 Mil/uL (ref 3.87–5.11)
RDW: 13.3 % (ref 11.5–15.5)
WBC: 5.2 10*3/uL (ref 4.0–10.5)

## 2017-10-21 DIAGNOSIS — R262 Difficulty in walking, not elsewhere classified: Secondary | ICD-10-CM | POA: Diagnosis not present

## 2017-10-29 ENCOUNTER — Ambulatory Visit: Payer: Medicare Other | Admitting: Family Medicine

## 2017-10-30 ENCOUNTER — Ambulatory Visit: Payer: Medicare Other | Admitting: Family Medicine

## 2017-11-12 DIAGNOSIS — Z96652 Presence of left artificial knee joint: Secondary | ICD-10-CM | POA: Diagnosis not present

## 2017-11-28 ENCOUNTER — Telehealth: Payer: Self-pay | Admitting: Family Medicine

## 2017-11-28 NOTE — Telephone Encounter (Signed)
Copied from [place] [id number]. Topic: Quick Communication - See Telephone Encounter >> Nov 28, 2017  5:01 PM Blase Mess A wrote: CRM for notification. See Telephone encounter for: 11/28/17.  Patient will in the office on 12/02/17 she will requesting the shiglex inj

## 2017-11-29 NOTE — Telephone Encounter (Signed)
I spoke with pt and advised we do not have the shingrix vaccine and pt will ck with pharmacies to see if shingrix is available. Nothing further needed.

## 2017-12-02 ENCOUNTER — Encounter: Payer: Self-pay | Admitting: Family Medicine

## 2017-12-02 ENCOUNTER — Ambulatory Visit (INDEPENDENT_AMBULATORY_CARE_PROVIDER_SITE_OTHER): Payer: Medicare HMO | Admitting: Family Medicine

## 2017-12-02 VITALS — BP 102/60 | HR 98 | Ht 66.0 in | Wt 148.0 lb

## 2017-12-02 DIAGNOSIS — E039 Hypothyroidism, unspecified: Secondary | ICD-10-CM | POA: Diagnosis not present

## 2017-12-02 DIAGNOSIS — Z23 Encounter for immunization: Secondary | ICD-10-CM

## 2017-12-02 DIAGNOSIS — E559 Vitamin D deficiency, unspecified: Secondary | ICD-10-CM | POA: Diagnosis not present

## 2017-12-02 LAB — VITAMIN D 25 HYDROXY (VIT D DEFICIENCY, FRACTURES): VITD: 32.19 ng/mL (ref 30.00–100.00)

## 2017-12-02 LAB — TSH: TSH: 2.83 u[IU]/mL (ref 0.35–4.50)

## 2017-12-02 NOTE — Progress Notes (Signed)
Subjective:    Patient ID: Dorothy Church, female    DOB: 1935-11-06, 82 y.o.   MRN: [id number]  HPI This is a 82 yo female who presents today for follow up of vit d deficiency, osteoporosis, knee pain.  Has done well since left TKR 7/19. No longer needs AFO. Taking weekly vitamin D supplementation. Bilateral ankle stiffness after sitting, also stiff in the morning. Better with activity.    Past Medical History:  Diagnosis Date  . Aortic valve disorders   . Arthritis   . Barrett's esophagus   . COPD (chronic obstructive pulmonary disease) ([place])   . GERD (gastroesophageal reflux disease)   . History of hiatal hernia   . Hypercholesteremia   . Hypothyroidism   . Impaired fasting glucose   . Intrinsic asthma, unspecified   . Obstructive sleep apnea (adult) (pediatric)   . Osteoporosis   . Pneumonia   . Pure hypercholesterolemia   . Syncope and collapse   . TIA (transient ischemic attack)   . Vitamin D deficiency    Past Surgical History:  Procedure Laterality Date  . BREAST EXCISIONAL BIOPSY Left   . COLONOSCOPY    . COLONOSCOPY WITH PROPOFOL N/A 12/14/2014   Procedure: COLONOSCOPY WITH PROPOFOL;  Surgeon: Lollie Sails, MD;  Location[facility] ENDOSCOPY;  Service: Endoscopy;  Laterality: N/A;  . ESOPHAGOGASTRODUODENOSCOPY (EGD) WITH PROPOFOL N/A 12/14/2014   Procedure: ESOPHAGOGASTRODUODENOSCOPY (EGD) WITH PROPOFOL;  Surgeon: Lollie Sails, MD;  Location: [facility] ENDOSCOPY;  Service: Endoscopy;  Laterality: N/A;  . ESOPHAGOGASTRODUODENOSCOPY (EGD) WITH PROPOFOL N/A 10/18/2016   Procedure: ESOPHAGOGASTRODUODENOSCOPY (EGD) WITH PROPOFOL;  Surgeon: Lollie Sails, MD;  Location: [facility] ENDOSCOPY;  Service: Endoscopy;  Laterality: N/A;  . EYE SURGERY    . HIP SURGERY Left    pin and plate  . prosthesis eye implant    . REPLACEMENT UNICONDYLAR JOINT KNEE Left   . TONSILLECTOMY    . TOTAL KNEE REVISION Left 09/13/2017   Procedure: TOTAL KNEE REVISION;  Surgeon: Dereck Leep, MD;  Location: [facility] ORS;  Service: Orthopedics;  Laterality: Left;   Family History  Problem Relation Age of Onset  . Heart disease Father   . Alcohol abuse Father   . Breast cancer Mother   . Bipolar disorder Brother    Social History   Tobacco Use  . Smoking status: Never Smoker  . Smokeless tobacco: Never Used  Substance Use Topics  . Alcohol use: Yes    Alcohol/week: 1.0 standard drinks    Types: 1 Glasses of wine per week    Comment: occas. wine  . Drug use: No      Review of Systems Per HPI    Objective:   Physical Exam Physical Exam  Constitutional: Oriented to person, place, and time. She appears well-developed and well-nourished.  HENT:  Head: Normocephalic and atraumatic.  Eyes: Conjunctivae are normal.  Neck: Normal range of motion. Neck supple.  Cardiovascular: Normal rate, regular rhythm and normal heart sounds.   Pulmonary/Chest: Effort normal and breath sounds normal.  Musculoskeletal: Normal range of motion.  Neurological: Alert and oriented to person, place, and time.  Skin: Skin is warm and dry.  Psychiatric: Normal mood and affect. Behavior is normal. Judgment and thought content normal.  Vitals reviewed.     BP 102/60 (BP Location: Right Arm, Patient Position: Sitting, Cuff Size: Normal)   Pulse 98   Ht 5\' 6"  (1.676 m)   Wt 148 lb (67.1 kg)   SpO2 97%  BMI 23.89 kg/m  Wt Readings from Last 3 Encounters:  12/02/17 148 lb (67.1 kg)  10/02/17 142 lb (64.4 kg)  09/13/17 145 lb (65.8 kg)       Assessment & Plan:  1. Vitamin D deficiency - VITAMIN D 25 Hydroxy (Vit-D Deficiency, Fractures)  2. Acquired hypothyroidism - TSH  3. Need for influenza vaccination - Flu vaccine HIGH DOSE PF  - follow up in 4-6 months for AWV/CPE  Clarene Reamer, FNP-BC  [facility] at [facility] [place] [facility]  12/02/2017 12:27 PM

## 2017-12-02 NOTE — Patient Instructions (Signed)
Good to see you today  Please schedule your annual wellness visit and annual physical exam for after the first of the year

## 2017-12-03 ENCOUNTER — Other Ambulatory Visit: Payer: Self-pay | Admitting: Family Medicine

## 2017-12-31 DIAGNOSIS — Z96652 Presence of left artificial knee joint: Secondary | ICD-10-CM | POA: Diagnosis not present

## 2017-12-31 DIAGNOSIS — M5416 Radiculopathy, lumbar region: Secondary | ICD-10-CM | POA: Diagnosis not present

## 2018-01-03 ENCOUNTER — Ambulatory Visit: Payer: Medicare HMO | Admitting: Internal Medicine

## 2018-01-03 ENCOUNTER — Encounter: Payer: Self-pay | Admitting: Internal Medicine

## 2018-01-03 VITALS — BP 130/68 | HR 95 | Resp 16 | Ht 68.0 in | Wt 149.0 lb

## 2018-01-03 DIAGNOSIS — G4733 Obstructive sleep apnea (adult) (pediatric): Secondary | ICD-10-CM | POA: Diagnosis not present

## 2018-01-03 DIAGNOSIS — J449 Chronic obstructive pulmonary disease, unspecified: Secondary | ICD-10-CM | POA: Diagnosis not present

## 2018-01-03 NOTE — Patient Instructions (Addendum)
Continue CPAP as prescribed  Lets do Split night sleep study to assess pressures  Continue inhalers as prescribed

## 2018-01-03 NOTE — Progress Notes (Addendum)
[facility] Consultation      MRN# [id number] Dorothy Church [birth year]   CC: Chief Complaint  Patient presents with  . Asthma    controlled at this time  . Sleep Apnea    pt states she uses almost nightly. She doesn't like it but uses it.      Brief History:  82 year old female past medical history of asthma  Patient states she had asthma during childhood where she had frequent hospitalizations but no intubations. Patient states her breathing is trouble with certain triggers such as mold, dust, damp areas. She was told about 45 years ago she had ASTHMA and was also placed on Symbicort at that time along with as needed albuterol. She has a history of obstructive sleep apnea diagnosed 8 years ago and she's currently on CPAP; she doesn't recall her CPAP settings. Patient states she never smoked tobacco had any secondhand exposure.  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    HPI Follow up ASTHMA Doing well on once daily Symbicort No exacerbation No infection at this time  Follow up OSA Doing well with cpap therapy 100% compliance last 30 days 97% compliance >4 hrs Resting well  Patient Uses and Benefits from CPAP therapy      Medication:    Current Outpatient Medications:  .  acetaminophen (TYLENOL) 500 MG tablet, Take 500-1,000 mg by mouth every 6 (six) hours as needed for moderate pain or headache. , Disp: , Rfl:  .  albuterol (VENTOLIN HFA) 108 (90 BASE) MCG/ACT inhaler, Inhale 2 puffs into the lungs every 6 (six) hours as needed for wheezing., Disp: 18 g, Rfl: 5 .  budesonide-formoterol (SYMBICORT) 160-4.5 MCG/ACT inhaler, Inhale 2 puffs into the lungs 2 (two) times daily. (Patient taking differently: Inhale 2 puffs into the lungs daily. ), Disp: 3 Inhaler, Rfl: 3 .  clindamycin (CLEOCIN) 150 MG capsule, TAKE 4 CAPSULES BY MOUTH 1 HOUR BEDFORE DENTAL APPOINTMENT,  Disp: , Rfl: 0 .  denosumab (PROLIA) 60 MG/ML SOSY injection, Inject into the skin., Disp: , Rfl:  .  diclofenac sodium (VOLTAREN) 1 % GEL, Apply 4 g topically 4 (four) times daily. (Patient taking differently: Apply 4 g topically 4 (four) times daily as needed (for pain). ), Disp: 5 Tube, Rfl: 11 .  diphenhydramine-acetaminophen (TYLENOL PM) 25-500 MG TABS tablet, Take 1 tablet by mouth at bedtime as needed (for sleep)., Disp: , Rfl:  .  docusate sodium (COLACE) 50 MG capsule, Take 50 mg by mouth daily., Disp: , Rfl:  .  esomeprazole (NEXIUM) 20 MG capsule, Take 20 mg by mouth daily. , Disp: , Rfl:  .  Homeopathic Products (ZICAM COLD REMEDY PO), Take by mouth as needed., Disp: , Rfl:  .  ibuprofen (ADVIL,MOTRIN) 600 MG tablet, TAKE 1 TABLET BY MOUTH 4 TIMES A DAY WITH TYLENOL AS DIRECTED, Disp: , Rfl: 0 .  Lactobacillus Rhamnosus, GG, (CULTURELLE PO), Take 1 capsule by mouth daily., Disp: , Rfl:  .  levothyroxine (SYNTHROID, LEVOTHROID) 50 MCG tablet, TAKE 1 TABLET DAILY BEFORE BREAKFAST, Disp: 90 tablet, Rfl: 0 .  mometasone (NASONEX) 50 MCG/ACT nasal spray, Place 2 sprays into the nose as needed., Disp: , Rfl:  .  PARoxetine (PAXIL) 30 MG tablet, Take 1 tablet (30 mg total) by mouth every morning., Disp: 90 tablet, Rfl: 3 .  Polyvinyl Alcohol-Povidone PF (REFRESH) 1.4-0.6 % SOLN, Place 2 drops into both eyes  2 (two) times daily as needed (for dry eyes)., Disp: , Rfl:  .  simvastatin (ZOCOR) 20 MG tablet, Take 1 tablet (20 mg total) by mouth at bedtime., Disp: 90 tablet, Rfl: 3 .  Tiotropium Bromide Monohydrate (SPIRIVA RESPIMAT) 1.25 MCG/ACT AERS, INHALE 1 PUFF INTO THE LUNGS 2 (TWO) TIMES DAILY., Disp: , Rfl:     Review of Systems  Constitutional: Negative for chills and fever.  Eyes: Negative for blurred vision.  Respiratory: Negative for cough, hemoptysis, sputum production and shortness of breath.   Cardiovascular: Negative for chest pain and palpitations.  Gastrointestinal: Negative  for heartburn.  Genitourinary: Negative for dysuria.  Musculoskeletal: Negative for myalgias.  Skin: Negative for itching and rash.  Neurological: Negative for dizziness and headaches.  Endo/Heme/Allergies: Does not bruise/bleed easily.  Psychiatric/Behavioral: Negative for depression.      Allergies:  Amoxicillin; Cefzil [cefprozil]; Cephalexin; Propoxyphene; Relafen [nabumetone]; Talwin [pentazocine]; and Penicillins  Physical Examination:  BP 130/68 (BP Location: Left Arm, Cuff Size: Normal)   Pulse 95   Resp 16   Ht 5\' 8"  (1.727 m)   Wt 149 lb (67.6 kg)   SpO2 95%   BMI 22.66 kg/m   Physical Examination:   GENERAL:NAD, no fevers, chills, no weakness no fatigue HEAD: Normocephalic, atraumatic.  EYES: Pupils equal, round, reactive to light. Extraocular muscles intact. No scleral icterus.  MOUTH: Moist mucosal membrane. Dentition intact. No abscess noted.  EAR, NOSE, THROAT: Clear without exudates. No external lesions.  NECK: Supple. No thyromegaly. No nodules. No JVD.  PULMONARY: CTA B/L no wheezing, rhonchi, crackles CARDIOVASCULAR: S1 and S2. Regular rate and rhythm. No murmurs, rubs, or gallops. No edema. Pedal pulses 2+ bilaterally.  GASTROINTESTINAL: Soft, nontender, nondistended. No masses. Positive bowel sounds. No hepatosplenomegaly.  MUSCULOSKELETAL: No swelling, clubbing, or edema. Range of motion full in all extremities.  NEUROLOGIC: Cranial nerves II through XII are intact. No gross focal neurological deficits. Sensation intact. Reflexes intact.  SKIN: No ulceration, lesions, rashes, or cyanosis. Skin warm and dry. Turgor intact.  PSYCHIATRIC: Mood, affect within normal limits. The patient is awake, alert and oriented x 3. Insight, judgment intact.  ALL OTHER ROS ARE NEGATIVE      Assessment and Plan: 82 yo pleasant white female with well controlled ASTHMA Mild Persistent with underlying OSA    1.well controlled ASTHMA symbicort 2 puffs daily No need  for systemic steroids Albuterol as needed  2.OSA befitting and using CPAP Patient would like repeat study to assess the need for CPAP AHI down to 0.4 on current therapy   Follow up in 1 year  Kanika Bungert Patricia Pesa, M.D.  [facility]  Medical Director [place] Director [facility]

## 2018-01-06 ENCOUNTER — Telehealth: Payer: Self-pay | Admitting: Internal Medicine

## 2018-01-06 MED ORDER — BUDESONIDE-FORMOTEROL FUMARATE 160-4.5 MCG/ACT IN AERO: 2.0000 | INHALATION_SPRAY | Freq: Two times a day (BID) | RESPIRATORY_TRACT | 3 refills | Status: DC

## 2018-01-06 NOTE — Telephone Encounter (Signed)
°*  STAT* If patient is at the pharmacy, call can be transferred to refill team.   1. Which medications need to be refilled? (please list name of each medication and dose if known) Symbicort   2. Which pharmacy/location (including street and city if local pharmacy) is medication to be sent to? [place] mail order  3. Do they need a 30 day or 90 day supply? 90 day

## 2018-01-06 NOTE — Telephone Encounter (Signed)
rx sent

## 2018-01-14 ENCOUNTER — Other Ambulatory Visit: Payer: Self-pay | Admitting: Gastroenterology

## 2018-01-14 DIAGNOSIS — Z86010 Personal history of colonic polyps: Secondary | ICD-10-CM | POA: Diagnosis not present

## 2018-01-14 DIAGNOSIS — R1013 Epigastric pain: Secondary | ICD-10-CM | POA: Diagnosis not present

## 2018-01-14 DIAGNOSIS — R1314 Dysphagia, pharyngoesophageal phase: Secondary | ICD-10-CM

## 2018-01-22 ENCOUNTER — Ambulatory Visit: Payer: Medicare HMO | Attending: Internal Medicine

## 2018-01-22 DIAGNOSIS — R0683 Snoring: Secondary | ICD-10-CM | POA: Diagnosis not present

## 2018-01-22 DIAGNOSIS — G4761 Periodic limb movement disorder: Secondary | ICD-10-CM | POA: Diagnosis not present

## 2018-01-22 DIAGNOSIS — I499 Cardiac arrhythmia, unspecified: Secondary | ICD-10-CM | POA: Diagnosis not present

## 2018-01-22 DIAGNOSIS — G4733 Obstructive sleep apnea (adult) (pediatric): Secondary | ICD-10-CM | POA: Diagnosis present

## 2018-01-24 ENCOUNTER — Ambulatory Visit
Admission: RE | Admit: 2018-01-24 | Discharge: 2018-01-24 | Disposition: A | Discharge status: Home / Self Care | Payer: Medicare HMO | Source: Ambulatory Visit | Attending: Gastroenterology | Admitting: Gastroenterology

## 2018-01-24 DIAGNOSIS — R1314 Dysphagia, pharyngoesophageal phase: Secondary | ICD-10-CM | POA: Insufficient documentation

## 2018-01-24 DIAGNOSIS — K449 Diaphragmatic hernia without obstruction or gangrene: Secondary | ICD-10-CM | POA: Diagnosis not present

## 2018-01-24 DIAGNOSIS — G4733 Obstructive sleep apnea (adult) (pediatric): Secondary | ICD-10-CM | POA: Diagnosis not present

## 2018-01-24 DIAGNOSIS — R1310 Dysphagia, unspecified: Secondary | ICD-10-CM | POA: Diagnosis not present

## 2018-01-28 DIAGNOSIS — G4733 Obstructive sleep apnea (adult) (pediatric): Secondary | ICD-10-CM

## 2018-01-30 NOTE — Telephone Encounter (Signed)
Spoke to patient. I let her know that 1) Dr. Mortimer Fries did look at patient's note from last visit, and she does not have COPD. 2) the results of her sleep study show that she no longer needs the CPAP. Orders to d/c CPAP entered. Patient aware to follow-up in 1 year or sooner if needed.

## 2018-02-07 ENCOUNTER — Other Ambulatory Visit: Payer: Self-pay | Admitting: Pharmacist

## 2018-02-07 NOTE — Patient Outreach (Signed)
[facility]) Care Management  02/07/2018  Dorothy Church 1936/02/10 [id number]   Incoming call from Nelida Gores in response to the [facility] Medication Adherence Campaign. Speak with patient. HIPAA identifiers verified and verbal consent received.  [name] reports that she takes her simvastatin 20 mg daily as directed. Denies barriers to adherence. Reports that she has plenty of medication on hand. Note that patient last had this prescription refilled on 01/06/18 for a 90 day supply. Counsel patient on the importance of daily adherence to this medication.  Patient denies any medication questions/concerns at this time. Will close pharmacy episode.  Harlow Asa, PharmD, [place] [facility] [phone]

## 2018-02-10 ENCOUNTER — Encounter: Payer: Self-pay | Admitting: Family Medicine

## 2018-02-10 ENCOUNTER — Ambulatory Visit (INDEPENDENT_AMBULATORY_CARE_PROVIDER_SITE_OTHER): Payer: Medicare HMO | Admitting: Family Medicine

## 2018-02-10 VITALS — BP 118/64 | HR 89 | Temp 98.2°F | Ht 66.0 in | Wt 146.5 lb

## 2018-02-10 DIAGNOSIS — B9789 Other viral agents as the cause of diseases classified elsewhere: Secondary | ICD-10-CM

## 2018-02-10 DIAGNOSIS — J069 Acute upper respiratory infection, unspecified: Secondary | ICD-10-CM

## 2018-02-10 MED ORDER — BENZONATATE 100 MG PO CAPS: 100.0000 mg | ORAL_CAPSULE | Freq: Two times a day (BID) | ORAL | 0 refills | Status: DC | PRN

## 2018-02-10 NOTE — Patient Instructions (Addendum)
Based on your symptoms, it looks like you have a virus.   Antibiotics are not need for a viral infection but the following will help:   1. Drink plenty of fluids 2. Get lots of rest  Sinus Congestion 1) Neti Pot (Saline rinse) -- 2 times day -- if tolerated 2) Nasal Steroid 3) Over the counter congestion medications  Cough 1) Cough drops can be helpful 2) If still problem - you can try the Tessalon Pearls at the pharmacy 3) Honey is proven to be one of the best cough medications   Sore Throat 1) Honey as above, cough drops 2) Ibuprofen or Aleve can be helpful 3) Salt water Gargles  If you develop fevers (Temperature >100.4), chills, worsening symptoms or symptoms lasting longer than 10 days return to clinic.

## 2018-02-10 NOTE — Progress Notes (Signed)
Subjective:     Dorothy Church is a 82 y.o. female presenting for Cough (x 1 week to 10 days. Productive cough especially at night-yellow phlegm. Had severe sweats last night. Not sure of any fever. A little bit of runny nose. Has taking Tylenol.)     Cough  This is a new problem. The current episode started 1 to 4 weeks ago. The problem has been gradually worsening. The cough is productive of sputum. Associated symptoms include postnasal drip, rhinorrhea, shortness of breath and wheezing. Pertinent negatives include no chest pain, chills, ear congestion, ear pain, fever, headaches, myalgias, nasal congestion or sore throat. The symptoms are aggravated by lying down. She has tried steroid inhaler for the symptoms. The treatment provided mild relief. Her past medical history is significant for asthma.     Review of Systems  Constitutional: Negative for chills and fever.  HENT: Positive for postnasal drip and rhinorrhea. Negative for ear pain and sore throat.   Respiratory: Positive for cough, shortness of breath and wheezing.   Cardiovascular: Negative for chest pain.  Musculoskeletal: Negative for myalgias.  Neurological: Negative for headaches.     Social History   Tobacco Use  Smoking Status Never Smoker  Smokeless Tobacco Never Used        Objective:    BP Readings from Last 3 Encounters:  02/10/18 118/64  01/03/18 130/68  12/02/17 102/60   Wt Readings from Last 3 Encounters:  02/10/18 146 lb 8 oz (66.5 kg)  01/03/18 149 lb (67.6 kg)  12/02/17 148 lb (67.1 kg)    BP 118/64   Pulse 89   Temp 98.2 F (36.8 C)   Ht 5\' 6"  (1.676 m)   Wt 146 lb 8 oz (66.5 kg)   SpO2 96%   BMI 23.65 kg/m    Physical Exam Constitutional:      General: She is not in acute distress.    Appearance: She is well-developed. She is not diaphoretic.  HENT:     Head: Normocephalic and atraumatic.     Right Ear: Ear canal and external ear normal. There is impacted cerumen.     Left  Ear: Ear canal and external ear normal. There is impacted cerumen.     Nose: Mucosal edema and rhinorrhea present.     Right Sinus: No maxillary sinus tenderness or frontal sinus tenderness.     Left Sinus: No maxillary sinus tenderness or frontal sinus tenderness.     Mouth/Throat:     Pharynx: Uvula midline. Posterior oropharyngeal erythema present. No oropharyngeal exudate.     Tonsils: Swelling: 0 on the right. 0 on the left.  Eyes:     General: No scleral icterus.    Conjunctiva/sclera: Conjunctivae normal.  Neck:     Musculoskeletal: Neck supple.  Cardiovascular:     Rate and Rhythm: Normal rate and regular rhythm.     Heart sounds: Normal heart sounds. No murmur.  Pulmonary:     Effort: Pulmonary effort is normal. No respiratory distress.     Breath sounds: Normal breath sounds.  Lymphadenopathy:     Cervical: No cervical adenopathy.  Skin:    General: Skin is warm and dry.     Capillary Refill: Capillary refill takes less than 2 seconds.  Neurological:     Mental Status: She is alert.           Assessment & Plan:   Problem List Items Addressed This Visit    None    Visit Diagnoses  Viral URI with cough    -  Primary   Relevant Medications   benzonatate (TESSALON) 100 MG capsule     Discussed trial of saline rinse and nasal steroid Tessalon pearls if symptoms not improving but hx most consistent with sinus origin  Return if symptoms worsen or fail to improve.  Lesleigh Noe, MD

## 2018-02-25 ENCOUNTER — Telehealth: Payer: Self-pay | Admitting: *Deleted

## 2018-02-25 NOTE — Telephone Encounter (Signed)
Information has been submitted to pts insurance for verification of benefits. Awaiting response for coverage  

## 2018-03-03 DIAGNOSIS — H40001 Preglaucoma, unspecified, right eye: Secondary | ICD-10-CM | POA: Diagnosis not present

## 2018-03-10 DIAGNOSIS — H2511 Age-related nuclear cataract, right eye: Secondary | ICD-10-CM | POA: Diagnosis not present

## 2018-03-11 NOTE — Telephone Encounter (Signed)
Verification of benefits have been processed and an approval has been received for pts prolia injection. Pts estimated cost are appx $230. This is only an estimate and cannot be confirmed until benefits are paid. Please advise pt and schedule if needed. If scheduled, once the injection is received, pls contact me back with the date it was received so that I am able to update prolia folder. Thanks   Lm on pts vm requesting a call back. PA required but previous approval on file and valid through 08/16/2018 per [employer] rep, Merry Proud.

## 2018-03-12 ENCOUNTER — Other Ambulatory Visit (INDEPENDENT_AMBULATORY_CARE_PROVIDER_SITE_OTHER): Payer: Medicare HMO

## 2018-03-12 DIAGNOSIS — M810 Age-related osteoporosis without current pathological fracture: Secondary | ICD-10-CM

## 2018-03-12 LAB — CALCIUM: CALCIUM: 9.5 mg/dL (ref 8.4–10.5)

## 2018-03-17 ENCOUNTER — Encounter: Payer: Self-pay | Admitting: *Deleted

## 2018-03-17 ENCOUNTER — Encounter: Payer: Self-pay | Admitting: Family Medicine

## 2018-03-18 ENCOUNTER — Ambulatory Visit: Payer: Medicare HMO | Admitting: Anesthesiology

## 2018-03-18 ENCOUNTER — Encounter
Admission: RE | Disposition: A | Discharge status: Home / Self Care | Payer: Self-pay | Source: Home / Self Care | Attending: Gastroenterology

## 2018-03-18 ENCOUNTER — Ambulatory Visit
Admission: RE | Admit: 2018-03-18 | Discharge: 2018-03-18 | Disposition: A | Discharge status: Home / Self Care | Payer: Medicare HMO | Attending: Gastroenterology | Admitting: Gastroenterology

## 2018-03-18 DIAGNOSIS — K449 Diaphragmatic hernia without obstruction or gangrene: Secondary | ICD-10-CM | POA: Diagnosis not present

## 2018-03-18 DIAGNOSIS — K640 First degree hemorrhoids: Secondary | ICD-10-CM | POA: Diagnosis not present

## 2018-03-18 DIAGNOSIS — Z79890 Hormone replacement therapy (postmenopausal): Secondary | ICD-10-CM | POA: Diagnosis not present

## 2018-03-18 DIAGNOSIS — Z8673 Personal history of transient ischemic attack (TIA), and cerebral infarction without residual deficits: Secondary | ICD-10-CM | POA: Insufficient documentation

## 2018-03-18 DIAGNOSIS — Z79899 Other long term (current) drug therapy: Secondary | ICD-10-CM | POA: Diagnosis not present

## 2018-03-18 DIAGNOSIS — Z1211 Encounter for screening for malignant neoplasm of colon: Secondary | ICD-10-CM | POA: Diagnosis not present

## 2018-03-18 DIAGNOSIS — R1013 Epigastric pain: Secondary | ICD-10-CM | POA: Diagnosis not present

## 2018-03-18 DIAGNOSIS — K210 Gastro-esophageal reflux disease with esophagitis: Secondary | ICD-10-CM | POA: Diagnosis not present

## 2018-03-18 DIAGNOSIS — Z7982 Long term (current) use of aspirin: Secondary | ICD-10-CM | POA: Insufficient documentation

## 2018-03-18 DIAGNOSIS — F419 Anxiety disorder, unspecified: Secondary | ICD-10-CM | POA: Insufficient documentation

## 2018-03-18 DIAGNOSIS — K317 Polyp of stomach and duodenum: Secondary | ICD-10-CM | POA: Insufficient documentation

## 2018-03-18 DIAGNOSIS — E039 Hypothyroidism, unspecified: Secondary | ICD-10-CM | POA: Diagnosis not present

## 2018-03-18 DIAGNOSIS — E7800 Pure hypercholesterolemia, unspecified: Secondary | ICD-10-CM | POA: Insufficient documentation

## 2018-03-18 DIAGNOSIS — Z1389 Encounter for screening for other disorder: Secondary | ICD-10-CM | POA: Insufficient documentation

## 2018-03-18 DIAGNOSIS — K2930 Chronic superficial gastritis without bleeding: Secondary | ICD-10-CM | POA: Diagnosis not present

## 2018-03-18 DIAGNOSIS — G4733 Obstructive sleep apnea (adult) (pediatric): Secondary | ICD-10-CM | POA: Diagnosis not present

## 2018-03-18 DIAGNOSIS — D126 Benign neoplasm of colon, unspecified: Secondary | ICD-10-CM | POA: Diagnosis not present

## 2018-03-18 DIAGNOSIS — K2950 Unspecified chronic gastritis without bleeding: Secondary | ICD-10-CM | POA: Diagnosis not present

## 2018-03-18 DIAGNOSIS — K644 Residual hemorrhoidal skin tags: Secondary | ICD-10-CM | POA: Insufficient documentation

## 2018-03-18 DIAGNOSIS — K5790 Diverticulosis of intestine, part unspecified, without perforation or abscess without bleeding: Secondary | ICD-10-CM | POA: Diagnosis not present

## 2018-03-18 DIAGNOSIS — K259 Gastric ulcer, unspecified as acute or chronic, without hemorrhage or perforation: Secondary | ICD-10-CM | POA: Diagnosis not present

## 2018-03-18 DIAGNOSIS — K635 Polyp of colon: Secondary | ICD-10-CM | POA: Diagnosis not present

## 2018-03-18 DIAGNOSIS — K5730 Diverticulosis of large intestine without perforation or abscess without bleeding: Secondary | ICD-10-CM | POA: Insufficient documentation

## 2018-03-18 DIAGNOSIS — K219 Gastro-esophageal reflux disease without esophagitis: Secondary | ICD-10-CM | POA: Insufficient documentation

## 2018-03-18 DIAGNOSIS — K2960 Other gastritis without bleeding: Secondary | ICD-10-CM | POA: Diagnosis not present

## 2018-03-18 DIAGNOSIS — Z86010 Personal history of colonic polyps: Secondary | ICD-10-CM | POA: Diagnosis not present

## 2018-03-18 DIAGNOSIS — K222 Esophageal obstruction: Secondary | ICD-10-CM | POA: Insufficient documentation

## 2018-03-18 DIAGNOSIS — Z7951 Long term (current) use of inhaled steroids: Secondary | ICD-10-CM | POA: Insufficient documentation

## 2018-03-18 DIAGNOSIS — J449 Chronic obstructive pulmonary disease, unspecified: Secondary | ICD-10-CM | POA: Diagnosis not present

## 2018-03-18 DIAGNOSIS — K2270 Barrett's esophagus without dysplasia: Secondary | ICD-10-CM | POA: Insufficient documentation

## 2018-03-18 DIAGNOSIS — Z96659 Presence of unspecified artificial knee joint: Secondary | ICD-10-CM | POA: Diagnosis not present

## 2018-03-18 DIAGNOSIS — K5520 Angiodysplasia of colon without hemorrhage: Secondary | ICD-10-CM | POA: Diagnosis not present

## 2018-03-18 DIAGNOSIS — F329 Major depressive disorder, single episode, unspecified: Secondary | ICD-10-CM | POA: Insufficient documentation

## 2018-03-18 DIAGNOSIS — K2900 Acute gastritis without bleeding: Secondary | ICD-10-CM | POA: Diagnosis not present

## 2018-03-18 DIAGNOSIS — G4730 Sleep apnea, unspecified: Secondary | ICD-10-CM | POA: Insufficient documentation

## 2018-03-18 HISTORY — DX: Unspecified abdominal pain: R10.9

## 2018-03-18 HISTORY — PX: COLONOSCOPY WITH PROPOFOL: SHX5780

## 2018-03-18 HISTORY — DX: Obstructive sleep apnea (adult) (pediatric): G47.33

## 2018-03-18 HISTORY — DX: Abnormal findings on diagnostic imaging of liver and biliary tract: R93.2

## 2018-03-18 HISTORY — DX: Benign neoplasm of stomach: D13.1

## 2018-03-18 HISTORY — DX: Nontoxic goiter, unspecified: E04.9

## 2018-03-18 HISTORY — DX: Unspecified asthma, uncomplicated: J45.909

## 2018-03-18 HISTORY — PX: ESOPHAGOGASTRODUODENOSCOPY (EGD) WITH PROPOFOL: SHX5813

## 2018-03-18 HISTORY — DX: Depression, unspecified: F32.A

## 2018-03-18 HISTORY — DX: Fracture of one rib, left side, initial encounter for closed fracture: S22.32XA

## 2018-03-18 HISTORY — DX: Major depressive disorder, single episode, unspecified: F32.9

## 2018-03-18 HISTORY — DX: Anxiety disorder, unspecified: F41.9

## 2018-03-18 HISTORY — DX: Diverticulosis of intestine, part unspecified, without perforation or abscess without bleeding: K57.90

## 2018-03-18 HISTORY — DX: Polyp of colon: K63.5

## 2018-03-18 HISTORY — DX: Nonrheumatic aortic (valve) stenosis: I35.0

## 2018-03-18 SURGERY — COLONOSCOPY WITH PROPOFOL
Anesthesia: General

## 2018-03-18 MED ORDER — PROPOFOL 500 MG/50ML IV EMUL
INTRAVENOUS | Status: DC | PRN
  Administered 2018-03-18: 100 ug/kg/min via INTRAVENOUS

## 2018-03-18 MED ORDER — CLINDAMYCIN PHOSPHATE 600 MG/50ML IV SOLN
INTRAVENOUS | Status: AC
  Administered 2018-03-18: 600 mg via INTRAVENOUS
  Filled 2018-03-18: qty 50

## 2018-03-18 MED ORDER — CLINDAMYCIN PHOSPHATE 600 MG/50ML IV SOLN
600.0000 mg | Freq: Once | INTRAVENOUS | Status: AC
  Administered 2018-03-18: 600 mg via INTRAVENOUS

## 2018-03-18 MED ORDER — LIDOCAINE HCL (CARDIAC) PF 100 MG/5ML IV SOSY
PREFILLED_SYRINGE | INTRAVENOUS | Status: DC | PRN
  Administered 2018-03-18: 30 mg via INTRAVENOUS

## 2018-03-18 MED ORDER — SODIUM CHLORIDE 0.9 % IV SOLN
INTRAVENOUS | Status: DC
  Administered 2018-03-18: 1000 mL via INTRAVENOUS

## 2018-03-18 MED ORDER — PHENYLEPHRINE HCL 10 MG/ML IJ SOLN
INTRAMUSCULAR | Status: DC | PRN
  Administered 2018-03-18 (×2): 50 ug via INTRAVENOUS

## 2018-03-18 MED ORDER — LIDOCAINE HCL (PF) 1 % IJ SOLN
INTRAMUSCULAR | Status: AC
  Filled 2018-03-18: qty 2

## 2018-03-18 MED ORDER — FENTANYL CITRATE (PF) 100 MCG/2ML IJ SOLN
INTRAMUSCULAR | Status: DC | PRN
  Administered 2018-03-18: 50 ug via INTRAVENOUS

## 2018-03-18 MED ORDER — SODIUM CHLORIDE 0.9 % IV SOLN: INTRAVENOUS | Status: DC

## 2018-03-18 NOTE — Anesthesia Postprocedure Evaluation (Signed)
Anesthesia Post Note  Patient: Dorothy Church  Procedure(s) Performed: COLONOSCOPY WITH PROPOFOL (N/A ) ESOPHAGOGASTRODUODENOSCOPY (EGD) WITH PROPOFOL (N/A )  Patient location during evaluation: Endoscopy Anesthesia Type: General Level of consciousness: awake and alert and oriented Pain management: pain level controlled Vital Signs Assessment: post-procedure vital signs reviewed and stable Respiratory status: spontaneous breathing, nonlabored ventilation and respiratory function stable Cardiovascular status: blood pressure returned to baseline and stable Postop Assessment: no signs of nausea or vomiting Anesthetic complications: no     Last Vitals:  Vitals:   03/18/18 1005 03/18/18 1015  BP: 104/62 120/63  Pulse: 75 73  Resp: 19 12  Temp: (!) 36.1 C   SpO2: 100% 100%    Last Pain:  Vitals:   03/18/18 1015  TempSrc:   PainSc: 0-No pain                 Rhyli Depaula

## 2018-03-18 NOTE — Anesthesia Procedure Notes (Signed)
Performed by: Vaughan Sine Pre-anesthesia Checklist: Patient identified, Emergency Drugs available, Suction available, Patient being monitored and Timeout performed Patient Re-evaluated:Patient Re-evaluated prior to induction Oxygen Delivery Method: Nasal cannula Preoxygenation: Pre-oxygenation with 100% oxygen Airway Equipment and Method: Bite block Placement Confirmation: CO2 detector and positive ETCO2

## 2018-03-18 NOTE — H&P (Signed)
Outpatient short stay form Pre-procedure 03/18/2018 8:52 AM Lollie Sails MD  Primary Physician: Clarene Reamer, NP  Reason for visit: EGD and colonoscopy  History of present illness: Patient is a 83 year old female presenting today for an EGD and colonoscopy in regards to her personal history of Barrett's esophagus, dyspepsia, some amount of dysphagia and personal history of adenomatous colon polyps.  He did have a knee replacement about 6 months ago however this was a reoperation on a previous knee.  I have elected to give her some clindamycin beforehand.  Her last biopsies for Barrett's esophagus were equivocal.  Her swallowing issue is intermittent and can be with liquids as well as solids.  She did have a barium swallow on 01/24/2018 showing a prominence of the cricopharyngeus muscle a moderately prominent B ring but no delay in passage of the standard barium tablet no mass lesion and peristalsis seen be normal.  She tolerated her prep well.  Takes no aspirin or blood thinning agent the exception of 81 mg aspirin this been held for several days..    Current Facility-Administered Medications:  .  0.9 %  sodium chloride infusion, , Intravenous, Continuous, Lollie Sails, MD .  0.9 %  sodium chloride infusion, , Intravenous, Continuous, Lollie Sails, MD .  clindamycin (CLEOCIN) 600 MG/50ML IVPB, , , ,  .  clindamycin (CLEOCIN) injection 600 mg, 600 mg, Intravenous, Once, Lollie Sails, MD .  lidocaine (PF) (XYLOCAINE) 1 % injection, , , ,   Medications Prior to Admission  Medication Sig Dispense Refill Last Dose  . acetaminophen (TYLENOL) 500 MG tablet Take 500 mg by mouth every 6 (six) hours as needed.     Marland Kitchen albuterol (PROVENTIL HFA;VENTOLIN HFA) 108 (90 Base) MCG/ACT inhaler Inhale into the lungs every 6 (six) hours as needed for wheezing or shortness of breath.   Past Month at Unknown time  . albuterol (VENTOLIN HFA) 108 (90 BASE) MCG/ACT inhaler Inhale 2 puffs into  the lungs every 6 (six) hours as needed for wheezing. 18 g 5 Past Month at Unknown time  . aspirin 81 MG chewable tablet Chew by mouth daily.   03/14/2018  . budesonide-formoterol (SYMBICORT) 160-4.5 MCG/ACT inhaler Inhale 2 puffs into the lungs 2 (two) times daily.     . diclofenac sodium (VOLTAREN) 1 % GEL Apply topically 4 (four) times daily.     Marland Kitchen esomeprazole (NEXIUM) 20 MG capsule Take 20 mg by mouth daily.     Marland Kitchen levothyroxine (SYNTHROID, LEVOTHROID) 50 MCG tablet Take 50 mcg by mouth daily before breakfast.     . mometasone (NASONEX) 50 MCG/ACT nasal spray Place 2 sprays into the nose daily.     . paroxetine (PAXIL) 10 MG/5ML suspension Take 30 mg by mouth every morning.     . simvastatin (ZOCOR) 20 MG tablet Take 20 mg by mouth daily.     Marland Kitchen acetaminophen (TYLENOL) 500 MG tablet Take 500-1,000 mg by mouth every 6 (six) hours as needed for moderate pain or headache.    Taking  . benzonatate (TESSALON) 100 MG capsule Take 1 capsule (100 mg total) by mouth 2 (two) times daily as needed for cough. 20 capsule 0   . budesonide-formoterol (SYMBICORT) 160-4.5 MCG/ACT inhaler Inhale 2 puffs into the lungs 2 (two) times daily. 3 Inhaler 3 03/18/2018  . clindamycin (CLEOCIN) 150 MG capsule TAKE 4 CAPSULES BY MOUTH 1 HOUR BEDFORE DENTAL APPOINTMENT  0 Taking  . denosumab (PROLIA) 60 MG/ML SOSY injection Inject into the  skin.   Taking  . diclofenac sodium (VOLTAREN) 1 % GEL Apply 4 g topically 4 (four) times daily. (Patient taking differently: Apply 4 g topically 4 (four) times daily as needed (for pain). ) 5 Tube 11 Taking  . diphenhydramine-acetaminophen (TYLENOL PM) 25-500 MG TABS tablet Take 1 tablet by mouth at bedtime as needed (for sleep).   Taking  . docusate sodium (COLACE) 50 MG capsule Take 50 mg by mouth daily.   Taking  . esomeprazole (NEXIUM) 20 MG capsule Take 20 mg by mouth daily.    Taking  . Lactobacillus Rhamnosus, GG, (CULTURELLE PO) Take 1 capsule by mouth daily.   Taking  .  levothyroxine (SYNTHROID, LEVOTHROID) 50 MCG tablet TAKE 1 TABLET DAILY BEFORE BREAKFAST 90 tablet 0 Taking  . mometasone (NASONEX) 50 MCG/ACT nasal spray Place 2 sprays into the nose as needed.   Taking  . PARoxetine (PAXIL) 30 MG tablet Take 1 tablet (30 mg total) by mouth every morning. 90 tablet 3 Taking  . Polyvinyl Alcohol-Povidone PF (REFRESH) 1.4-0.6 % SOLN Place 2 drops into both eyes 2 (two) times daily as needed (for dry eyes).   Taking  . simvastatin (ZOCOR) 20 MG tablet Take 1 tablet (20 mg total) by mouth at bedtime. 90 tablet 3 Taking     Allergies  Allergen Reactions  . Amoxicillin Other (See Comments)    Pencillins  . Cefzil [Cefprozil] Other (See Comments)    Unknown  . Cefzil [Cefprozil]   . Cephalexin Other (See Comments)    Unknown  . Cephalexin   . Penicillins   . Propoxyphene Other (See Comments)    Unknown  . Propoxyphene   . Relafen [Nabumetone] Other (See Comments)    Unknown  . Relafen [Nabumetone]   . Talwin [Pentazocine] Other (See Comments)    Unknown  . Talwin [Pentazocine]   . Penicillins Other (See Comments)    Cannot remember reaction     Past Medical History:  Diagnosis Date  . Abdominal pain   . Abnormal CT of liver   . Anxiety   . Aortic stenosis   . Aortic valve disorders   . Arthritis   . Asthma   . Barrett's esophagus   . Colonic polyp   . COPD (chronic obstructive pulmonary disease) ([place])   . Depressive disorder   . Diverticulosis   . Fundic gland polyps of stomach, benign   . GERD (gastroesophageal reflux disease)   . Goiter   . History of hiatal hernia   . Hypercholesteremia   . Hypothyroidism   . Impaired fasting glucose   . Intrinsic asthma, unspecified   . Left rib fracture   . Obstructive sleep apnea (adult) (pediatric)   . OSA (obstructive sleep apnea)   . Osteoporosis   . Pneumonia   . Pure hypercholesterolemia   . Syncope and collapse   . TIA (transient ischemic attack)   . Vitamin D deficiency      Review of systems:      Physical Exam    Heart and lungs: Regular rate and rhythm without rub or gallop lungs are bilaterally clear    HEENT: Normocephalic atraumatic eyes are anicteric note prosthetic left eye    Other:     Pertinant exam for procedure: Soft nontender nondistended bowel sounds positive normoactive    Planned proceedures: EGD, colonoscopy and indicated procedures. I have discussed the risks benefits and complications of procedures to include not limited to bleeding, infection, perforation and the risk of sedation  and the patient wishes to proceed.    Lollie Sails, MD Gastroenterology 03/18/2018  8:52 AM

## 2018-03-18 NOTE — Op Note (Signed)
[facility]astroenterology Patient Name: Dorothy Church Procedure Date: 03/18/2018 8:31 AM MRN: [id number] Account #: [id number] Date of Birth: 11-19-1935 Admit Type: Outpatient Age: 83 Room: [facility] ENDO ROOM 3 Gender: Female Note Status: Finalized Procedure:            Colonoscopy Indications:          Personal history of colonic polyps Providers:            Lollie Sails, MD Medicines:            Monitored Anesthesia Care Complications:        No immediate complications. Procedure:            Pre-Anesthesia Assessment:                       - ASA Grade Assessment: II - A patient with mild                        systemic disease.                       After obtaining informed consent, the colonoscope was                        passed under direct vision. Throughout the procedure,                        the patient's blood pressure, pulse, and oxygen                        saturations were monitored continuously. The                        Colonoscope was introduced through the anus and                        advanced to the the cecum, identified by appendiceal                        orifice and ileocecal valve. The colonoscopy was                        performed without difficulty. The patient tolerated the                        procedure well. The quality of the bowel preparation                        was good. Findings:      Many small and large-mouthed diverticula were found in the sigmoid       colon, descending colon, transverse colon and ascending colon.      A 3 mm polyp was found in the mid sigmoid colon. The polyp was sessile.       The polyp was removed with a cold biopsy forceps. Resection and       retrieval were complete.      Non-bleeding external and internal hemorrhoids were found during       retroflexion and during anoscopy. The hemorrhoids were small and Grade I       (internal hemorrhoids that do not prolapse).      The digital  rectal  exam was normal. Impression:           - Diverticulosis in the sigmoid colon, in the                        descending colon, in the transverse colon and in the                        ascending colon.                       - One 3 mm polyp in the mid sigmoid colon, removed with                        a cold biopsy forceps. Resected and retrieved.                       - Non-bleeding external and internal hemorrhoids. Recommendation:       - Discharge patient to home.                       - Miralax 1 capful (17 grams) in 8 ounces of water PO                        daily daily. Procedure Code(s):    --- Professional ---                       [phone], Colonoscopy, flexible; with biopsy, single or                        multiple Diagnosis Code(s):    --- Professional ---                       K64.0, First degree hemorrhoids                       D12.5, Benign neoplasm of sigmoid colon                       Z86.010, Personal history of colonic polyps                       K57.30, Diverticulosis of large intestine without                        perforation or abscess without bleeding CPT copyright 2018 American [employer] Association. All rights reserved. The codes documented in this report are preliminary and upon coder review may  be revised to meet current compliance requirements. Lollie Sails, MD 03/18/2018 10:04:03 AM This report has been signed electronically. Number of Addenda: 0 Note Initiated On: 03/18/2018 8:31 AM Scope Withdrawal Time: 0 hours 7 minutes 40 seconds  Total Procedure Duration: 0 hours 14 minutes 58 seconds       [facility]

## 2018-03-18 NOTE — Transfer of Care (Signed)
Immediate Anesthesia Transfer of Care Note  Patient: Dorothy Church  Procedure(s) Performed: COLONOSCOPY WITH PROPOFOL (N/A ) ESOPHAGOGASTRODUODENOSCOPY (EGD) WITH PROPOFOL (N/A )  Patient Location: PACU  Anesthesia Type:General  Level of Consciousness: awake and sedated  Airway & Oxygen Therapy: Patient Spontanous Breathing and Patient connected to nasal cannula oxygen  Post-op Assessment: Report given to RN and Post -op Vital signs reviewed and stable  Post vital signs: Reviewed and stable  Last Vitals:  Vitals Value Taken Time  BP 104/62 03/18/2018 10:05 AM  Temp 36.1 C 03/18/2018 10:05 AM  Pulse 75 03/18/2018 10:05 AM  Resp 17 03/18/2018 10:05 AM  SpO2 100 % 03/18/2018 10:05 AM  Vitals shown include unvalidated device data.  Last Pain:  Vitals:   03/18/18 1005  TempSrc: Tympanic         Complications: No apparent anesthesia complications

## 2018-03-18 NOTE — Anesthesia Preprocedure Evaluation (Signed)
Anesthesia Evaluation  Patient identified by MRN, date of birth, ID band Patient awake    Reviewed: Allergy & Precautions, NPO status , Patient's Chart, lab work & pertinent test results  History of Anesthesia Complications Negative for: history of anesthetic complications  Airway Mallampati: III  TM Distance: <3 FB Neck ROM: Full    Dental no notable dental hx.    Pulmonary asthma , Sleep apnea: had repeat sleep study and no longer needs CPAP. , COPD,  COPD inhaler,    breath sounds clear to auscultation- rhonchi (-) wheezing      Cardiovascular Exercise Tolerance: Good (-) hypertension(-) CAD, (-) Past MI, (-) Cardiac Stents and (-) CABG  Rhythm:Regular Rate:Normal - Systolic murmurs and - Diastolic murmurs    Neuro/Psych neg Seizures PSYCHIATRIC DISORDERS Anxiety Depression TIA   GI/Hepatic Neg liver ROS, hiatal hernia, GERD  ,  Endo/Other  neg diabetesHypothyroidism   Renal/GU negative Renal ROS     Musculoskeletal  (+) Arthritis ,   Abdominal (+) - obese,   Peds  Hematology negative hematology ROS (+)   Anesthesia Other Findings Past Medical History: No date: Abdominal pain No date: Abnormal CT of liver No date: Anxiety No date: Aortic stenosis No date: Aortic valve disorders No date: Arthritis No date: Asthma No date: Barrett's esophagus No date: Colonic polyp No date: COPD (chronic obstructive pulmonary disease) (HCC) No date: Depressive disorder No date: Diverticulosis No date: Fundic gland polyps of stomach, benign No date: GERD (gastroesophageal reflux disease) No date: Goiter No date: History of hiatal hernia No date: Hypercholesteremia No date: Hypothyroidism No date: Impaired fasting glucose No date: Intrinsic asthma, unspecified No date: Left rib fracture No date: Obstructive sleep apnea (adult) (pediatric) No date: OSA (obstructive sleep apnea) No date: Osteoporosis No date:  Pneumonia No date: Pure hypercholesterolemia No date: Syncope and collapse No date: TIA (transient ischemic attack) No date: Vitamin D deficiency   Reproductive/Obstetrics                             Anesthesia Physical Anesthesia Plan  ASA: II  Anesthesia Plan: General   Post-op Pain Management:    Induction: Intravenous  PONV Risk Score and Plan: 2 and Propofol infusion  Airway Management Planned: Natural Airway  Additional Equipment:   Intra-op Plan:   Post-operative Plan:   Informed Consent: I have reviewed the patients History and Physical, chart, labs and discussed the procedure including the risks, benefits and alternatives for the proposed anesthesia with the patient or authorized representative who has indicated his/her understanding and acceptance.     Dental advisory given  Plan Discussed with: CRNA and Anesthesiologist  Anesthesia Plan Comments:         Anesthesia Quick Evaluation

## 2018-03-18 NOTE — Anesthesia Post-op Follow-up Note (Signed)
Anesthesia QCDR form completed.        

## 2018-03-18 NOTE — Op Note (Signed)
[facility] Gastroenterology Patient Name: Dorothy Church Procedure Date: 03/18/2018 8:33 AM MRN: [id number] Account #: [id number] Date of Birth: 07/14/1935 Admit Type: Outpatient Age: 83 Room: [facility] ENDO ROOM 3 Gender: Female Note Status: Finalized Procedure:            Upper GI endoscopy Indications:          Dyspepsia, Dysphagia, Follow-up of Barrett's esophagus Providers:            Lollie Sails, MD Referring MD:         Elby Beck (Referring MD) Medicines:            Monitored Anesthesia Care Complications:        No immediate complications. Procedure:            Pre-Anesthesia Assessment:                       - ASA Grade Assessment: II - A patient with mild                        systemic disease.                       After obtaining informed consent, the endoscope was                        passed under direct vision. Throughout the procedure,                        the patient's blood pressure, pulse, and oxygen                        saturations were monitored continuously. The Endoscope                        was introduced through the mouth, and advanced to the                        third part of duodenum. The upper GI endoscopy was                        accomplished without difficulty. The patient tolerated                        the procedure well. Findings:      Patchy mild inflammation characterized by congestion (edema), erosions       and erythema was found in the gastric antrum. Biopsies were taken with a       cold forceps for histology. Biopsies were taken with a cold forceps for       Helicobacter pylori testing.      A few 3 to 5 mm pedunculated and sessile polyps with no bleeding and no       stigmata of recent bleeding were found in the gastric body. Biopsies       were taken with a cold forceps for histology.      A small hiatal hernia was found. The Z-line was a variable distance from       incisors; the hiatal hernia  was sliding.      Patchy mild mucosal variance characterized by altered texture was found       in the entire  duodenum. Biopsies were taken with a cold forceps for       histology.      The Z-line was variable and was found 36 cm from the incisors. Mucosa       was biopsied with a cold forceps for histology in a targeted manner and       in 4 quadrants. One specimen bottle was sent to pathology.      A non-obstructing Schatzki ring was found in the lower third of the       esophagus and at the gastroesophageal junction. A TTS dilator was passed       through the scope. Dilation with a 12-13.5-15 mm balloon dilator was       performed to 15 mm. Impression:           - Erosive gastritis. Biopsied.                       - A few gastric polyps. Biopsied.                       - Small hiatal hernia.                       - Mucosal variant in the duodenum. Biopsied.                       - Z-line variable, 36 cm from the incisors. Biopsied.                       - Non-obstructing Schatzki ring. Dilated. Recommendation:       - Clear liquid diet today.                       - Full liquid diet for 2 days, then advance as                        tolerated to soft diet for 2 days.                       - Await pathology results.                       - Perform a colonoscopy today. Procedure Code(s):    --- Professional ---                       [phone], Esophagogastroduodenoscopy, flexible, transoral;                        with transendoscopic balloon dilation of esophagus                        (less than 30 mm diameter) Diagnosis Code(s):    --- Professional ---                       K29.60, Other gastritis without bleeding                       K31.7, Polyp of stomach and duodenum                       K44.9, Diaphragmatic hernia without obstruction or  gangrene                       K31.89, Other diseases of stomach and duodenum                       K22.8, Other  specified diseases of esophagus                       K22.2, Esophageal obstruction                       R10.13, Epigastric pain                       R13.10, Dysphagia, unspecified                       K22.70, Barrett's esophagus without dysplasia CPT copyright 2018 [employer]. All rights reserved. The codes documented in this report are preliminary and upon coder review may  be revised to meet current compliance requirements. Lollie Sails, MD 03/18/2018 9:42:25 AM This report has been signed electronically. Number of Addenda: 0 Note Initiated On: 03/18/2018 8:33 AM      [facility]

## 2018-03-19 ENCOUNTER — Encounter: Payer: Self-pay | Admitting: Gastroenterology

## 2018-03-19 LAB — SURGICAL PATHOLOGY

## 2018-03-26 ENCOUNTER — Ambulatory Visit: Payer: Medicare HMO

## 2018-04-01 ENCOUNTER — Ambulatory Visit (INDEPENDENT_AMBULATORY_CARE_PROVIDER_SITE_OTHER): Payer: Medicare HMO | Admitting: *Deleted

## 2018-04-01 DIAGNOSIS — M810 Age-related osteoporosis without current pathological fracture: Secondary | ICD-10-CM

## 2018-04-01 MED ORDER — DENOSUMAB 60 MG/ML ~~LOC~~ SOSY
60.0000 mg | PREFILLED_SYRINGE | Freq: Once | SUBCUTANEOUS | Status: AC
  Administered 2018-04-01: 60 mg via SUBCUTANEOUS

## 2018-04-01 NOTE — Progress Notes (Signed)
Per orders of Dr Damita Dunnings, injection of prolia given by Modena Nunnery. Patient tolerated injection well.

## 2018-04-02 ENCOUNTER — Encounter: Payer: Self-pay | Admitting: Family Medicine

## 2018-04-09 ENCOUNTER — Telehealth: Payer: Self-pay | Admitting: Family Medicine

## 2018-04-09 NOTE — Telephone Encounter (Signed)
Patient called about Prolia.  Patient said she had her Prolia shot last week and she said she checked with her insurance and the Prolia shot hasn't been filed with them,yet.

## 2018-04-10 NOTE — Telephone Encounter (Signed)
I left msg for pt explaining the charges are currently in a WQ and should post to insurance in the next 5 days.

## 2018-04-23 ENCOUNTER — Ambulatory Visit: Payer: Medicare HMO

## 2018-04-28 ENCOUNTER — Ambulatory Visit (INDEPENDENT_AMBULATORY_CARE_PROVIDER_SITE_OTHER): Payer: Medicare HMO

## 2018-04-28 VITALS — BP 100/60 | HR 86 | Temp 97.8°F | Ht 66.0 in | Wt 146.9 lb

## 2018-04-28 DIAGNOSIS — Z0000 Encounter for general adult medical examination without abnormal findings: Secondary | ICD-10-CM

## 2018-04-28 DIAGNOSIS — E785 Hyperlipidemia, unspecified: Secondary | ICD-10-CM | POA: Diagnosis not present

## 2018-04-28 LAB — CBC WITH DIFFERENTIAL/PLATELET
BASOS PCT: 0.7 % (ref 0.0–3.0)
Basophils Absolute: 0 10*3/uL (ref 0.0–0.1)
Eosinophils Absolute: 0.3 10*3/uL (ref 0.0–0.7)
Eosinophils Relative: 6 % — ABNORMAL HIGH (ref 0.0–5.0)
HCT: 41.9 % (ref 36.0–46.0)
Hemoglobin: 13.9 g/dL (ref 12.0–15.0)
Lymphocytes Relative: 21.3 % (ref 12.0–46.0)
Lymphs Abs: 1.2 10*3/uL (ref 0.7–4.0)
MCHC: 33.2 g/dL (ref 30.0–36.0)
MCV: 88.6 fl (ref 78.0–100.0)
Monocytes Absolute: 0.5 10*3/uL (ref 0.1–1.0)
Monocytes Relative: 9.7 % (ref 3.0–12.0)
Neutro Abs: 3.5 10*3/uL (ref 1.4–7.7)
Neutrophils Relative %: 62.3 % (ref 43.0–77.0)
Platelets: 240 10*3/uL (ref 150.0–400.0)
RBC: 4.73 Mil/uL (ref 3.87–5.11)
RDW: 14.2 % (ref 11.5–15.5)
WBC: 5.6 10*3/uL (ref 4.0–10.5)

## 2018-04-28 LAB — LIPID PANEL
CHOLESTEROL: 158 mg/dL (ref 0–200)
HDL: 59.1 mg/dL (ref >39.00)
LDL Cholesterol: 71 mg/dL (ref 0–99)
NonHDL: 98.6
TRIGLYCERIDES: 139 mg/dL (ref 0.0–149.0)
Total CHOL/HDL Ratio: 3
VLDL: 27.8 mg/dL (ref 0.0–40.0)

## 2018-04-28 LAB — COMPREHENSIVE METABOLIC PANEL
ALT: 13 U/L (ref 0–35)
AST: 15 U/L (ref 0–37)
Albumin: 4.1 g/dL (ref 3.5–5.2)
Alkaline Phosphatase: 63 U/L (ref 39–117)
BUN: 15 mg/dL (ref 6–23)
CO2: 31 mEq/L (ref 19–32)
Calcium: 8.9 mg/dL (ref 8.4–10.5)
Chloride: 104 mEq/L (ref 96–112)
Creatinine, Ser: 0.8 mg/dL (ref 0.40–1.20)
GFR: 68.95 mL/min (ref >60.00)
Glucose, Bld: 88 mg/dL (ref 70–99)
Potassium: 4.3 mEq/L (ref 3.5–5.1)
Sodium: 141 mEq/L (ref 135–145)
Total Bilirubin: 0.5 mg/dL (ref 0.2–1.2)
Total Protein: 6.7 g/dL (ref 6.0–8.3)

## 2018-04-28 NOTE — Patient Instructions (Signed)
Ms. Hennings , Thank you for taking time to come for your Medicare Wellness Visit. I appreciate your ongoing commitment to your health goals. Please review the following plan we discussed and let me know if I can assist you in the future.   These are the goals we discussed: Goals    . Healthy Lifestyle     Starting 04/28/2018, I will continue to garden as weather permits, sleep at least 6-8 hours daily, and attempt to drink at least 3-4 glasses of water daily.        This is a list of the screening recommended for you and due dates:  Health Maintenance  Topic Date Due  . Tetanus Vaccine  04/17/2026  . Flu Shot  Completed  . DEXA scan (bone density measurement)  Completed  . Pneumonia vaccines  Completed   Preventive Care for Adults  A healthy lifestyle and preventive care can promote health and wellness. Preventive health guidelines for adults include the following key practices.  . A routine yearly physical is a good way to check with your health care provider about your health and preventive screening. It is a chance to share any concerns and updates on your health and to receive a thorough exam.  . Visit your dentist for a routine exam and preventive care every 6 months. Brush your teeth twice a day and floss once a day. Good oral hygiene prevents tooth decay and gum disease.  . The frequency of eye exams is based on your age, health, family medical history, use  of contact lenses, and other factors. Follow your health care provider's recommendations for frequency of eye exams.  . Eat a healthy diet. Foods like vegetables, fruits, whole grains, low-fat dairy products, and lean protein foods contain the nutrients you need without too many calories. Decrease your intake of foods high in solid fats, added sugars, and salt. Eat the right amount of calories for you. Get information about a proper diet from your health care provider, if necessary.  . Regular physical exercise is one of the  most important things you can do for your health. Most adults should get at least 150 minutes of moderate-intensity exercise (any activity that increases your heart rate and causes you to sweat) each week. In addition, most adults need muscle-strengthening exercises on 2 or more days a week.  Silver Sneakers may be a benefit available to you. To determine eligibility, you may visit the website: www.silversneakers.com or contact program at 608-652-3707 Mon-Fri between 8AM-8PM.   . Maintain a healthy weight. The body mass index (BMI) is a screening tool to identify possible weight problems. It provides an estimate of body fat based on height and weight. Your health care provider can find your BMI and can help you achieve or maintain a healthy weight.   For adults 20 years and older: ? A BMI below 18.5 is considered underweight. ? A BMI of 18.5 to 24.9 is normal. ? A BMI of 25 to 29.9 is considered overweight. ? A BMI of 30 and above is considered obese.   . Maintain normal blood lipids and cholesterol levels by exercising and minimizing your intake of saturated fat. Eat a balanced diet with plenty of fruit and vegetables. Blood tests for lipids and cholesterol should begin at age 15 and be repeated every 5 years. If your lipid or cholesterol levels are high, you are over 50, or you are at high risk for heart disease, you may need your cholesterol levels checked  more frequently. Ongoing high lipid and cholesterol levels should be treated with medicines if diet and exercise are not working.  . If you smoke, find out from your health care provider how to quit. If you do not use tobacco, please do not start.  . If you choose to drink alcohol, please do not consume more than 2 drinks per day. One drink is considered to be 12 ounces (355 mL) of beer, 5 ounces (148 mL) of wine, or 1.5 ounces (44 mL) of liquor.  . If you are 70-70 years old, ask your health care provider if you should take aspirin to  prevent strokes.  . Use sunscreen. Apply sunscreen liberally and repeatedly throughout the day. You should seek shade when your shadow is shorter than you. Protect yourself by wearing long sleeves, pants, a wide-brimmed hat, and sunglasses year round, whenever you are outdoors.  . Once a month, do a whole body skin exam, using a mirror to look at the skin on your back. Tell your health care provider of new moles, moles that have irregular borders, moles that are larger than a pencil eraser, or moles that have changed in shape or color.

## 2018-04-28 NOTE — Progress Notes (Signed)
Subjective:   Dorothy Church is a 83 y.o. female who presents for Medicare Annual (Subsequent) preventive examination.  Review of Systems:  N/A Cardiac Risk Factors include: advanced age (>79men, >11 women);dyslipidemia     Objective:     Vitals: BP 100/60 (BP Location: Right Arm, Patient Position: Sitting, Cuff Size: Normal)   Pulse 86   Temp 97.8 F (36.6 C) (Oral)   Ht 5\' 6"  (1.676 m) Comment: no shoes  Wt 146 lb 14.4 oz (66.6 kg)   SpO2 94%   BMI 23.71 kg/m   Body mass index is 23.71 kg/m.  Advanced Directives 04/28/2018 03/18/2018 09/13/2017 09/13/2017 08/30/2017 12/11/2016 12/11/2016  Does Patient Have a Medical Advance Directive? Yes Yes Yes Yes Yes Yes No  Type of Paramedic of [place];Living will [place];Living will [place];Living will [place];Living will [place];Living will [place];Living will -  Does patient want to make changes to medical advance directive? - - No - Patient declined No - Patient declined No - Patient declined Yes (Inpatient - patient defers changing a medical advance directive at this time) -  Copy of [place] in Chart? No - copy requested Yes - validated most recent copy scanned in chart (See row information) No - copy requested No - copy requested No - copy requested No - copy requested -  Would patient like information on creating a medical advance directive? 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 Abdominal pain   . Abnormal CT of liver   . Anxiety   . Aortic stenosis   . Aortic valve disorders   . Arthritis   . Asthma   . Barrett's esophagus   . Colonic polyp   . COPD (chronic obstructive pulmonary disease) ([place])   . Depressive disorder   . Diverticulosis   . Fundic gland polyps of stomach, benign   . GERD (gastroesophageal reflux disease)   . Goiter   . History of hiatal hernia   . Hypercholesteremia   . Hypothyroidism   . Impaired fasting glucose   . Intrinsic asthma, unspecified   . Left rib fracture   . Obstructive sleep apnea (adult) (pediatric)   . OSA (obstructive sleep apnea)   . Osteoporosis   . Pneumonia   . Pure hypercholesterolemia   . Syncope and collapse   . TIA (transient ischemic attack)   . Vitamin D deficiency    Past Surgical History:  Procedure Laterality Date  . BREAST EXCISIONAL BIOPSY Left   . COLONOSCOPY    . COLONOSCOPY WITH ESOPHAGOGASTRODUODENOSCOPY (EGD)    . COLONOSCOPY WITH PROPOFOL N/A 12/14/2014   Procedure: COLONOSCOPY WITH PROPOFOL;  Surgeon:  Lollie Sails, MD;  Location: [facility] ENDOSCOPY;  Service: Endoscopy;  Laterality: N/A;  . COLONOSCOPY WITH PROPOFOL N/A 03/18/2018   Procedure: COLONOSCOPY WITH PROPOFOL;  Surgeon: Lollie Sails, MD;  Location: [facility] ENDOSCOPY;  Service: Endoscopy;  Laterality: N/A;  . ENUCLEATION    . ESOPHAGOGASTRODUODENOSCOPY (EGD) WITH PROPOFOL N/A 12/14/2014   Procedure: ESOPHAGOGASTRODUODENOSCOPY (EGD) WITH PROPOFOL;  Surgeon: Lollie Sails, MD;  Location: [facility] ENDOSCOPY;  Service: Endoscopy;  Laterality: N/A;  . ESOPHAGOGASTRODUODENOSCOPY (EGD) WITH PROPOFOL N/A 10/18/2016   Procedure: ESOPHAGOGASTRODUODENOSCOPY (EGD)  WITH PROPOFOL;  Surgeon: Lollie Sails, MD;  Location: [facility] ENDOSCOPY;  Service: Endoscopy;  Laterality: N/A;  . ESOPHAGOGASTRODUODENOSCOPY (EGD) WITH PROPOFOL N/A 03/18/2018   Procedure: ESOPHAGOGASTRODUODENOSCOPY (EGD) WITH PROPOFOL;  Surgeon: Lollie Sails, MD;  Location: [facility] ENDOSCOPY;  Service: Endoscopy;  Laterality: N/A;  . EYE SURGERY    . HIP SURGERY Left    pin and plate  . Left Total Knee Arthroplasty    . prosthesis eye implant    . REPLACEMENT UNICONDYLAR JOINT KNEE Left   . TONSILLECTOMY    . TOTAL KNEE REVISION Left 09/13/2017   Procedure: TOTAL KNEE REVISION;  Surgeon: Dereck Leep, MD;  Location: [facility] ORS;  Service: Orthopedics;  Laterality: Left;   Family History  Problem Relation Age of Onset  . Heart disease Father   . Alcohol abuse Father   . Breast cancer Mother   . Bipolar disorder Brother    Social History   Socioeconomic History  . Marital status: Divorced    Spouse name: Not on file  . Number of children: Not on file  . Years of education: Not on file  . Highest education level: Not on file  Occupational History  . Not on file  Social Needs  . Financial resource strain: Not on file  . Food insecurity:    Worry: Not on file    Inability: Not on file  . Transportation needs:    Medical: Not on file    Non-medical: Not on file  Tobacco Use  . Smoking status: Never Smoker  . Smokeless tobacco: Never Used  Substance and Sexual Activity  . Alcohol use: Yes    Alcohol/week: 1.0 standard drinks    Types: 1 Glasses of wine per week    Comment: occas. wine  . Drug use: No  . Sexual activity: Never  Lifestyle  . Physical activity:    Days per week: Not on file    Minutes per session: Not on file  . Stress: Not on file  Relationships  . Social connections:    Talks on phone: Not on file    Gets together: Not on file    Attends religious service: Not on file    Active member of club or organization: Not on file    Attends meetings of  clubs or organizations: Not on file    Relationship status: Not on file  Other Topics Concern  . Not on file  Social History Narrative   ** Merged History Encounter **       Lived in [place], moved to [place] two years ago.   Divorced.      Has a living will.   Would desire CPR, would not desire extraordinary measures.          Outpatient Encounter Medications as of 04/28/2018  Medication Sig  . acetaminophen (TYLENOL) 500 MG tablet Take 500 mg by mouth every 6 (six) hours as needed.  Marland Kitchen  albuterol (VENTOLIN HFA) 108 (90 BASE) MCG/ACT inhaler Inhale 2 puffs into the lungs every 6 (six) hours as needed for wheezing.  Marland Kitchen aspirin 81 MG chewable tablet Chew by mouth daily.  . budesonide-formoterol (SYMBICORT) 160-4.5 MCG/ACT inhaler Inhale 2 puffs into the lungs 2 (two) times daily.  . clindamycin (CLEOCIN) 150 MG capsule TAKE 4 CAPSULES BY MOUTH 1 HOUR BEDFORE DENTAL APPOINTMENT  . denosumab (PROLIA) 60 MG/ML SOSY injection Inject into the skin.  Marland Kitchen diclofenac sodium (VOLTAREN) 1 % GEL Apply 4 g topically 4 (four) times daily. (Patient taking differently: Apply 4 g topically 4 (four) times daily as needed (for pain). )  . diphenhydramine-acetaminophen (TYLENOL PM) 25-500 MG TABS tablet Take 1 tablet by mouth at bedtime as needed (for sleep).  . docusate sodium (COLACE) 50 MG capsule Take 50 mg by mouth daily.  Marland Kitchen esomeprazole (NEXIUM) 20 MG capsule Take 20 mg by mouth daily.   . Lactobacillus Rhamnosus, GG, (CULTURELLE PO) Take 1 capsule by mouth daily.  Marland Kitchen levothyroxine (SYNTHROID, LEVOTHROID) 50 MCG tablet TAKE 1 TABLET DAILY BEFORE BREAKFAST  . mometasone (NASONEX) 50 MCG/ACT nasal spray Place 2 sprays into the nose as needed.  Marland Kitchen PARoxetine (PAXIL) 30 MG tablet Take 1 tablet (30 mg total) by mouth every morning.  . Polyvinyl Alcohol-Povidone PF (REFRESH) 1.4-0.6 % SOLN Place 2 drops into both eyes 2 (two) times daily as needed (for dry eyes).  . simvastatin (ZOCOR) 20 MG tablet  Take 1 tablet (20 mg total) by mouth at bedtime.  . [DISCONTINUED] albuterol (PROVENTIL HFA;VENTOLIN HFA) 108 (90 Base) MCG/ACT inhaler Inhale into the lungs every 6 (six) hours as needed for wheezing or shortness of breath.  . [DISCONTINUED] budesonide-formoterol (SYMBICORT) 160-4.5 MCG/ACT inhaler Inhale 2 puffs into the lungs 2 (two) times daily.  . [DISCONTINUED] diclofenac sodium (VOLTAREN) 1 % GEL Apply topically 4 (four) times daily.  . [DISCONTINUED] esomeprazole (NEXIUM) 20 MG capsule Take 20 mg by mouth daily.  . [DISCONTINUED] levothyroxine (SYNTHROID, LEVOTHROID) 50 MCG tablet Take 50 mcg by mouth daily before breakfast.  . [DISCONTINUED] mometasone (NASONEX) 50 MCG/ACT nasal spray Place 2 sprays into the nose daily.  . [DISCONTINUED] paroxetine (PAXIL) 10 MG/5ML suspension Take 30 mg by mouth every morning.  . [DISCONTINUED] simvastatin (ZOCOR) 20 MG tablet Take 20 mg by mouth daily.  . [DISCONTINUED] acetaminophen (TYLENOL) 500 MG tablet Take 500-1,000 mg by mouth every 6 (six) hours as needed for moderate pain or headache.   . [DISCONTINUED] benzonatate (TESSALON) 100 MG capsule Take 1 capsule (100 mg total) by mouth 2 (two) times daily as needed for cough.   No facility-administered encounter medications on file as of 04/28/2018.     Activities of Daily Living In your present state of health, do you have any difficulty performing the following activities: 04/28/2018 09/13/2017  Hearing? N -  Vision? N -  Difficulty concentrating or making decisions? N -  Walking or climbing stairs? N -  Dressing or bathing? N -  Doing errands, shopping? N N  Preparing Food and eating ? N -  Using the Toilet? N -  In the past six months, have you accidently leaked urine? N -  Do you have problems with loss of bowel control? Y -  Managing your Medications? N -  Managing your Finances? N -  Housekeeping or managing your Housekeeping? N -  Some recent data might be hidden    Patient Care  Team: Elby Beck, FNP as PCP - General (Nurse  Practitioner) Vilinda Boehringer, MD (Inactive) as Consulting Physician (Internal Medicine) Elby Beck, FNP (Nurse Practitioner)    Assessment:   This is a routine wellness examination for Deveney.   Hearing Screening   125Hz  250Hz  500Hz  1000Hz  2000Hz  3000Hz  4000Hz  6000Hz  8000Hz   Right ear:   40 40 40  0    Left ear:   40 40 40  0    Vision Screening Comments: Vision exam in Jan 2020 with Dr. Sandra Cockayne   Exercise Activities and Dietary recommendations Current Exercise Habits: The patient does not participate in regular exercise at present, Exercise limited by: None identified  Goals    . Healthy Lifestyle     Starting 04/28/2018, I will continue to garden as weather permits, sleep at least 6-8 hours daily, and attempt to drink at least 3-4 glasses of water daily.        Fall Risk Fall Risk  04/28/2018 06/26/2017 05/01/2017 03/21/2017 02/15/2017  Falls in the past year? 0 No No Yes No  Comment - - - - -  Number falls in past yr: - - - 1 -  Comment - - - tripped on a rug 02-28-2017 "several fractures . Has boot on. [facility] [employer] in [place]. -  Injury with Fall? - - - Yes -  Comment - - - fractures of right foot -  Risk for fall due to : - - - Impaired mobility -  Risk for fall due to: Comment - - - tripped over rug -  Follow up - - - Education provided -   Depression Screen PHQ 2/9 Scores 04/28/2018 06/26/2017 05/01/2017 03/21/2017  PHQ - 2 Score 0 0 0 0  PHQ- 9 Score 0 - - -     Cognitive Function MMSE - Mini Mental State Exam 04/28/2018 10/24/2016  Orientation to time 5 5  Orientation to Place 5 5  Registration 3 3  Attention/ Calculation 0 0  Recall 3 3  Language- name 2 objects 0 0  Language- repeat 1 1  Language- follow 3 step command 3 3  Language- read & follow direction 0 0  Write a sentence 0 0  Copy design 0 0  Total score 20 20     PLEASE NOTE: A Mini-Cog screen was completed. Maximum score is 20. A value of 0  denotes this part of [place] MMSE was not completed or the patient failed this part of the Mini-Cog screening.   Mini-Cog Screening Orientation to Time - Max 5 pts Orientation to Place - Max 5 pts Registration - Max 3 pts Recall - Max 3 pts Language Repeat - Max 1 pts Language Follow 3 Step Command - Max 3 pts     Immunization History  Administered Date(s) Administered  . Influenza Whole 12/14/2010  . Influenza, High Dose Seasonal PF 12/02/2017  . Influenza,inj,Quad PF,6+ Mos 11/10/2012, 11/05/2013, 11/10/2014, 10/17/2015, 10/24/2016  . Influenza-Unspecified 12/04/2011, 11/10/2012, 11/05/2013  . Pneumococcal Conjugate-13 10/07/2009, 04/23/2013, 11/24/2013  . Pneumococcal Polysaccharide-23 11/13/2009  . Td 04/17/2016  . Zoster 05/08/2007  . Zoster Recombinat (Shingrix) 04/09/2018    Screening Tests Health Maintenance  Topic Date Due  . TETANUS/TDAP  04/17/2026  . INFLUENZA VACCINE  Completed  . DEXA SCAN  Completed  . PNA vac Low Risk Adult  Completed      Plan:     I have personally reviewed, addressed, and noted the following in the patient's chart:  A. Medical and social history B. Use of alcohol, tobacco or illicit drugs  C. Current medications and supplements D. Functional ability and status E.  Nutritional status F.  Physical activity G. Advance directives H. List of other physicians I.  Hospitalizations, surgeries, and ER visits in previous 12 months J.  [place] to include hearing, vision, cognitive, depression L. Referrals and appointments - none  In addition, I have reviewed and discussed with patient certain preventive protocols, quality metrics, and best practice recommendations. A written personalized care plan for preventive services as well as general preventive health recommendations were provided to patient.  See attached scanned questionnaire for additional information.   Signed,   Lindell Noe, MHA, BS, LPN Health Coach

## 2018-04-28 NOTE — Progress Notes (Signed)
PCP notes:   Health maintenance:  No gaps identified.   Abnormal screenings:   Hearing - failed  Hearing Screening   125Hz  250Hz  500Hz  1000Hz  2000Hz  3000Hz  4000Hz  6000Hz  8000Hz   Right ear:   40 40 40  0    Left ear:   40 40 40  0    Vision Screening Comments: Vision exam in Jan 2020 with Dr. Sandra Cockayne  Patient concerns:   None  Nurse concerns:  None  Next PCP appt:   04/30/18 @ 1430

## 2018-04-28 NOTE — Progress Notes (Signed)
I reviewed health advisor's note, was available for consultation, and agree with documentation and plan.  

## 2018-04-30 ENCOUNTER — Encounter: Payer: Self-pay | Admitting: Family Medicine

## 2018-04-30 ENCOUNTER — Other Ambulatory Visit: Payer: Self-pay

## 2018-04-30 ENCOUNTER — Ambulatory Visit (INDEPENDENT_AMBULATORY_CARE_PROVIDER_SITE_OTHER): Payer: Medicare HMO | Admitting: Family Medicine

## 2018-04-30 VITALS — BP 120/62 | HR 95 | Resp 12 | Ht 66.0 in | Wt 146.5 lb

## 2018-04-30 DIAGNOSIS — Z0000 Encounter for general adult medical examination without abnormal findings: Secondary | ICD-10-CM | POA: Diagnosis not present

## 2018-04-30 NOTE — Patient Instructions (Signed)
Good to see you today  Follow up in 6 months  Health Maintenance After Age 81 After age 70, you are at a higher risk for certain long-term diseases and infections as well as injuries from falls. Falls are a major cause of broken bones and head injuries in people who are older than age 32. Getting regular preventive care can help to keep you healthy and well. Preventive care includes getting regular testing and making lifestyle changes as recommended by your health care provider. Talk with your health care provider about:  Which screenings and tests you should have. A screening is a test that checks for a disease when you have no symptoms.  A diet and exercise plan that is right for you. What should I know about screenings and tests to prevent falls? Screening and testing are the best ways to find a health problem early. Early diagnosis and treatment give you the best chance of managing medical conditions that are common after age 27. Certain conditions and lifestyle choices may make you more likely to have a fall. Your health care provider may recommend:  Regular vision checks. Poor vision and conditions such as cataracts can make you more likely to have a fall. If you wear glasses, make sure to get your prescription updated if your vision changes.  Medicine review. Work with your health care provider to regularly review all of the medicines you are taking, including over-the-counter medicines. Ask your health care provider about any side effects that may make you more likely to have a fall. Tell your health care provider if any medicines that you take make you feel dizzy or sleepy.  Osteoporosis screening. Osteoporosis is a condition that causes the bones to get weaker. This can make the bones weak and cause them to break more easily.  Blood pressure screening. Blood pressure changes and medicines to control blood pressure can make you feel dizzy.  Strength and balance checks. Your health care  provider may recommend certain tests to check your strength and balance while standing, walking, or changing positions.  Foot health exam. Foot pain and numbness, as well as not wearing proper footwear, can make you more likely to have a fall.  Depression screening. You may be more likely to have a fall if you have a fear of falling, feel emotionally low, or feel unable to do activities that you used to do.  Alcohol use screening. Using too much alcohol can affect your balance and may make you more likely to have a fall. What actions can I take to lower my risk of falls? General instructions  Talk with your health care provider about your risks for falling. Tell your health care provider if: ? You fall. Be sure to tell your health care provider about all falls, even ones that seem minor. ? You feel dizzy, sleepy, or off-balance.  Take over-the-counter and prescription medicines only as told by your health care provider. These include any supplements.  Eat a healthy diet and maintain a healthy weight. A healthy diet includes low-fat dairy products, low-fat (lean) meats, and fiber from whole grains, beans, and lots of fruits and vegetables. Home safety  Remove any tripping hazards, such as rugs, cords, and clutter.  Install safety equipment such as grab bars in bathrooms and safety rails on stairs.  Keep rooms and walkways well-lit. Activity   Follow a regular exercise program to stay fit. This will help you maintain your balance. Ask your health care provider what types of  exercise are appropriate for you.  If you need a cane or walker, use it as recommended by your health care provider.  Wear supportive shoes that have nonskid soles. Lifestyle  Do not drink alcohol if your health care provider tells you not to drink.  If you drink alcohol, limit how much you have: ? 0-1 drink a day for women. ? 0-2 drinks a day for men.  Be aware of how much alcohol is in your drink. In the  [place] one drink equals one typical bottle of beer (12 oz), one-half glass of wine (5 oz), or one shot of hard liquor (1 oz).  Do not use any products that contain nicotine or tobacco, such as cigarettes and e-cigarettes. If you need help quitting, ask your health care provider. Summary  Having a healthy lifestyle and getting preventive care can help to protect your health and wellness after age 36.  Screening and testing are the best way to find a health problem early and help you avoid having a fall. Early diagnosis and treatment give you the best chance for managing medical conditions that are more common for people who are older than age 12.  Falls are a major cause of broken bones and head injuries in people who are older than age 11. Take precautions to prevent a fall at home.  Work with your health care provider to learn what changes you can make to improve your health and wellness and to prevent falls. This information is not intended to replace advice given to you by your health care provider. Make sure you discuss any questions you have with your health care provider. Document Released: 12/19/2016 Document Revised: 12/19/2016 Document Reviewed: 12/19/2016 Elsevier Interactive Patient Education  2019 [employer].

## 2018-04-30 NOTE — Progress Notes (Signed)
Subjective:    Patient ID: Dorothy Church, female    DOB: August 01, 1935, 83 y.o.   MRN: [id number]  HPI This is an 83 yo female who presents today for CPE.   Last CPE- greater than 1 year, had Medicare AWV 04/29/18.  Mammo- 12/01/15- declines further screeing Colonoscopy- 03/18/18 Tdap- 04/17/16 Flu- annual Eye- annual Dental- regular Exercise- not formal, does own housework  GERD- continues to have daily symptoms, worse with leaning over, big meal, tomato products. Taking nexium twice a day. Has follow up with GI.   Constipation- taking stool softeners.    Past Medical History:  Diagnosis Date  . Abdominal pain   . Abnormal CT of liver   . Anxiety   . Aortic stenosis   . Aortic valve disorders   . Arthritis   . Asthma   . Barrett's esophagus   . Colonic polyp   . COPD (chronic obstructive pulmonary disease) ([place])   . Depressive disorder   . Diverticulosis   . Fundic gland polyps of stomach, benign   . GERD (gastroesophageal reflux disease)   . Goiter   . History of hiatal hernia   . Hypercholesteremia   . Hypothyroidism   . Impaired fasting glucose   . Intrinsic asthma, unspecified   . Left rib fracture   . Obstructive sleep apnea (adult) (pediatric)   . OSA (obstructive sleep apnea)   . Osteoporosis   . Pneumonia   . Pure hypercholesterolemia   . Syncope and collapse   . TIA (transient ischemic attack)   . Vitamin D deficiency    Past Surgical History:  Procedure Laterality Date  . BREAST EXCISIONAL BIOPSY Left   . COLONOSCOPY    . COLONOSCOPY WITH ESOPHAGOGASTRODUODENOSCOPY (EGD)    . COLONOSCOPY WITH PROPOFOL N/A 12/14/2014   Procedure: COLONOSCOPY WITH PROPOFOL;  Surgeon: Lollie Sails, MD;  Location: [facility] ENDOSCOPY;  Service: Endoscopy;  Laterality: N/A;  . COLONOSCOPY WITH PROPOFOL N/A 03/18/2018   Procedure: COLONOSCOPY WITH PROPOFOL;  Surgeon: Lollie Sails, MD;  Location: [facility] ENDOSCOPY;  Service: Endoscopy;  Laterality: N/A;  .  ENUCLEATION    . ESOPHAGOGASTRODUODENOSCOPY (EGD) WITH PROPOFOL N/A 12/14/2014   Procedure: ESOPHAGOGASTRODUODENOSCOPY (EGD) WITH PROPOFOL;  Surgeon: Lollie Sails, MD;  Location: [facility] ENDOSCOPY;  Service: Endoscopy;  Laterality: N/A;  . ESOPHAGOGASTRODUODENOSCOPY (EGD) WITH PROPOFOL N/A 10/18/2016   Procedure: ESOPHAGOGASTRODUODENOSCOPY (EGD) WITH PROPOFOL;  Surgeon: Lollie Sails, MD;  Location: [facility] ENDOSCOPY;  Service: Endoscopy;  Laterality: N/A;  . ESOPHAGOGASTRODUODENOSCOPY (EGD) WITH PROPOFOL N/A 03/18/2018   Procedure: ESOPHAGOGASTRODUODENOSCOPY (EGD) WITH PROPOFOL;  Surgeon: Lollie Sails, MD;  Location: [facility] ENDOSCOPY;  Service: Endoscopy;  Laterality: N/A;  . EYE SURGERY    . HIP SURGERY Left    pin and plate  . Left Total Knee Arthroplasty    . prosthesis eye implant    . REPLACEMENT UNICONDYLAR JOINT KNEE Left   . TONSILLECTOMY    . TOTAL KNEE REVISION Left 09/13/2017   Procedure: TOTAL KNEE REVISION;  Surgeon: Dereck Leep, MD;  Location: [facility] ORS;  Service: Orthopedics;  Laterality: Left;   Family History  Problem Relation Age of Onset  . Heart disease Father   . Alcohol abuse Father   . Breast cancer Mother   . Bipolar disorder Brother    Social History   Tobacco Use  . Smoking status: Never Smoker  . Smokeless tobacco: Never Used  Substance Use Topics  . Alcohol use: Yes    Alcohol/week: 1.0  standard drinks    Types: 1 Glasses of wine per week    Comment: occas. wine  . Drug use: No      Review of Systems  Constitutional: Negative.   HENT: Negative.   Eyes: Negative.   Respiratory: Negative.   Cardiovascular: Negative.   Gastrointestinal: Positive for constipation.       Acid indigestion. See HPI.   Endocrine: Negative.   Genitourinary: Negative.   Musculoskeletal: Negative.   Skin: Negative.   Allergic/Immunologic: Negative.   Neurological: Negative.   Hematological: Negative.   Psychiatric/Behavioral: Negative.         Objective:   Physical Exam Vitals signs reviewed.  Constitutional:      General: She is not in acute distress.    Appearance: Normal appearance. She is normal weight. She is not ill-appearing, toxic-appearing or diaphoretic.  HENT:     Head: Normocephalic and atraumatic.     Mouth/Throat:     Mouth: Mucous membranes are moist.     Pharynx: Oropharynx is clear.  Eyes:     Conjunctiva/sclera: Conjunctivae normal.  Neck:     Musculoskeletal: Normal range of motion and neck supple. No neck rigidity or muscular tenderness.  Cardiovascular:     Rate and Rhythm: Normal rate and regular rhythm.     Heart sounds: Normal heart sounds.  Pulmonary:     Effort: Pulmonary effort is normal.     Breath sounds: Normal breath sounds.  Musculoskeletal:     Right lower leg: No edema.     Left lower leg: No edema.  Lymphadenopathy:     Cervical: No cervical adenopathy.  Skin:    General: Skin is warm and dry.  Neurological:     Mental Status: She is alert and oriented to person, place, and time.  Psychiatric:        Mood and Affect: Mood normal.        Behavior: Behavior normal.        Thought Content: Thought content normal.        Judgment: Judgment normal.       BP 120/62 (BP Location: Left Arm, Patient Position: Sitting)   Pulse 95   Resp 12   Ht 5\' 6"  (1.676 m)   Wt 146 lb 8 oz (66.5 kg)   SpO2 96%   BMI 23.65 kg/m  Wt Readings from Last 3 Encounters:  04/30/18 146 lb 8 oz (66.5 kg)  04/28/18 146 lb 14.4 oz (66.6 kg)  03/18/18 145 lb (65.8 kg)   Depression screen [facility] 2/9 04/28/2018 06/26/2017 05/01/2017 03/21/2017 10/24/2016  Decreased Interest 0 0 0 0 3  Down, Depressed, Hopeless 0 0 0 0 0  PHQ - 2 Score 0 0 0 0 3  Altered sleeping 0 - - - 1  Tired, decreased energy 0 - - - 1  Change in appetite 0 - - - 0  Feeling bad or failure about yourself  0 - - - 0  Trouble concentrating 0 - - - 0  Moving slowly or fidgety/restless 0 - - - 0  Suicidal thoughts 0 - - - 0  PHQ-9 Score 0  - - - 5  Difficult doing work/chores Not difficult at all - - - Not difficult at all       Assessment & Plan:  1. Annual physical exam - she is generally doing well, reviewed medications, she is agreeable to follow up with pulmonary and GI - follow up in 6 months   Clarene Reamer,  FNP-BC  [facility], [facility]  05/04/2018 1:40 PM

## 2018-05-01 ENCOUNTER — Other Ambulatory Visit: Payer: Self-pay

## 2018-05-01 MED ORDER — LEVOTHYROXINE SODIUM 50 MCG PO TABS: ORAL_TABLET | ORAL | 0 refills | Status: DC

## 2018-05-01 MED ORDER — SIMVASTATIN 20 MG PO TABS: 20.0000 mg | ORAL_TABLET | Freq: Every day | ORAL | 3 refills | Status: DC

## 2018-05-01 MED ORDER — PAROXETINE HCL 30 MG PO TABS: 30.0000 mg | ORAL_TABLET | Freq: Every morning | ORAL | 3 refills | Status: DC

## 2018-05-04 ENCOUNTER — Encounter: Payer: Self-pay | Admitting: Family Medicine

## 2018-07-14 ENCOUNTER — Other Ambulatory Visit: Payer: Self-pay | Admitting: Family Medicine

## 2018-07-17 DIAGNOSIS — R1013 Epigastric pain: Secondary | ICD-10-CM | POA: Diagnosis not present

## 2018-07-17 DIAGNOSIS — R1313 Dysphagia, pharyngeal phase: Secondary | ICD-10-CM | POA: Diagnosis not present

## 2018-07-18 ENCOUNTER — Other Ambulatory Visit: Payer: Self-pay | Admitting: Gastroenterology

## 2018-07-18 ENCOUNTER — Other Ambulatory Visit (HOSPITAL_COMMUNITY): Payer: Self-pay | Admitting: Gastroenterology

## 2018-07-18 DIAGNOSIS — R1013 Epigastric pain: Secondary | ICD-10-CM

## 2018-07-28 ENCOUNTER — Other Ambulatory Visit: Payer: Self-pay | Admitting: Family Medicine

## 2018-07-31 ENCOUNTER — Ambulatory Visit
Admission: RE | Admit: 2018-07-31 | Discharge: 2018-07-31 | Disposition: A | Discharge status: Home / Self Care | Payer: Medicare HMO | Source: Ambulatory Visit | Attending: Gastroenterology | Admitting: Gastroenterology

## 2018-07-31 ENCOUNTER — Other Ambulatory Visit: Payer: Self-pay | Admitting: Gastroenterology

## 2018-07-31 ENCOUNTER — Other Ambulatory Visit: Payer: Self-pay

## 2018-07-31 DIAGNOSIS — R1013 Epigastric pain: Secondary | ICD-10-CM | POA: Diagnosis not present

## 2018-07-31 DIAGNOSIS — N281 Cyst of kidney, acquired: Secondary | ICD-10-CM | POA: Diagnosis not present

## 2018-07-31 DIAGNOSIS — K8020 Calculus of gallbladder without cholecystitis without obstruction: Secondary | ICD-10-CM | POA: Diagnosis not present

## 2018-07-31 MED ORDER — TECHNETIUM TC 99M MEBROFENIN IV KIT
5.2190 | PACK | Freq: Once | INTRAVENOUS | Status: AC | PRN
  Administered 2018-07-31: 5.219 via INTRAVENOUS

## 2018-08-07 DIAGNOSIS — K210 Gastro-esophageal reflux disease with esophagitis: Secondary | ICD-10-CM | POA: Diagnosis not present

## 2018-08-07 DIAGNOSIS — K449 Diaphragmatic hernia without obstruction or gangrene: Secondary | ICD-10-CM | POA: Diagnosis not present

## 2018-08-23 ENCOUNTER — Other Ambulatory Visit: Payer: Self-pay | Admitting: Family Medicine

## 2018-08-28 ENCOUNTER — Telehealth: Payer: Self-pay | Admitting: Family Medicine

## 2018-08-28 NOTE — Telephone Encounter (Signed)
Prollia benefits submitted.

## 2018-09-03 ENCOUNTER — Telehealth: Payer: Self-pay | Admitting: Family Medicine

## 2018-09-03 NOTE — Telephone Encounter (Signed)
Prior authorization received for Prolia.  [employer] Auth#[id number], valid 10/01/2018 to 10/01/2019.

## 2018-09-03 NOTE — Telephone Encounter (Signed)
Prior authorization faxed to [place], [id number].

## 2018-09-15 DIAGNOSIS — H43391 Other vitreous opacities, right eye: Secondary | ICD-10-CM | POA: Diagnosis not present

## 2018-09-16 DIAGNOSIS — R69 Illness, unspecified: Secondary | ICD-10-CM | POA: Diagnosis not present

## 2018-09-17 ENCOUNTER — Encounter: Payer: Self-pay | Admitting: Family Medicine

## 2018-09-17 NOTE — Telephone Encounter (Signed)
Patient left a voicemail stating that she had a dental appointment yesterday and was in the chair for about four hours. Patient stated that she became dizzy and nauseated from the visit and now has vertigo. Patient stated that the dentist told her that she should see her PCP or an ENT because the crystals in her ears may have been disturbed.  Called patient and was advised that she has already scheduled an appointment with an ENT Dr. Jamesetta Orleans in the morning and thinks that she will be okay until then.

## 2018-09-18 DIAGNOSIS — R42 Dizziness and giddiness: Secondary | ICD-10-CM | POA: Diagnosis not present

## 2018-09-18 DIAGNOSIS — H6123 Impacted cerumen, bilateral: Secondary | ICD-10-CM | POA: Diagnosis not present

## 2018-09-19 ENCOUNTER — Telehealth: Payer: Self-pay | Admitting: Family Medicine

## 2018-09-19 NOTE — Telephone Encounter (Signed)
Discussed Prolia benefits.  Pt would owe approximately $255 (20% for Prolia and $25 administration fee)  Pt understands and agrees.  Pt reports no Covid sxs or exposure.  Pt scheduled for next injection.

## 2018-09-22 ENCOUNTER — Other Ambulatory Visit (INDEPENDENT_AMBULATORY_CARE_PROVIDER_SITE_OTHER): Payer: Self-pay | Admitting: Otolaryngology

## 2018-09-22 DIAGNOSIS — R42 Dizziness and giddiness: Secondary | ICD-10-CM

## 2018-09-22 DIAGNOSIS — L259 Unspecified contact dermatitis, unspecified cause: Secondary | ICD-10-CM | POA: Diagnosis not present

## 2018-09-23 ENCOUNTER — Ambulatory Visit (INDEPENDENT_AMBULATORY_CARE_PROVIDER_SITE_OTHER): Payer: Medicare HMO

## 2018-09-23 ENCOUNTER — Other Ambulatory Visit: Payer: Self-pay

## 2018-09-23 DIAGNOSIS — R42 Dizziness and giddiness: Secondary | ICD-10-CM

## 2018-09-24 ENCOUNTER — Telehealth: Payer: Self-pay | Admitting: Internal Medicine

## 2018-09-24 ENCOUNTER — Telehealth: Payer: Self-pay

## 2018-09-24 NOTE — Telephone Encounter (Signed)
That is fine with me.

## 2018-09-24 NOTE — Telephone Encounter (Signed)
Copied from [place] [id number]. Topic: Appointment Scheduling - Transfer of Care >> Sep 24, 2018 12:00 PM Sheran Luz wrote: Pt is requesting to transfer FROM: Clarene Reamer  Pt is requesting to transfer TO: Dr. Aundra DubinJacklynn Lewis  Send CRM to patient's current PCP (transferring FROM).

## 2018-09-24 NOTE — Telephone Encounter (Signed)
If pcp ok them ok

## 2018-09-24 NOTE — Telephone Encounter (Signed)
Pt wants to transfer care call to schedule appt please  Thanks Schaller

## 2018-09-25 ENCOUNTER — Encounter: Payer: Self-pay | Admitting: Internal Medicine

## 2018-09-25 ENCOUNTER — Ambulatory Visit (INDEPENDENT_AMBULATORY_CARE_PROVIDER_SITE_OTHER): Payer: Medicare HMO | Admitting: Internal Medicine

## 2018-09-25 ENCOUNTER — Other Ambulatory Visit: Payer: Self-pay

## 2018-09-25 VITALS — BP 116/68 | HR 93 | Temp 97.2°F | Wt 145.0 lb

## 2018-09-25 DIAGNOSIS — L247 Irritant contact dermatitis due to plants, except food: Secondary | ICD-10-CM

## 2018-09-25 MED ORDER — DEXAMETHASONE SODIUM PHOSPHATE 10 MG/ML IJ SOLN
10.0000 mg | Freq: Once | INTRAMUSCULAR | Status: AC
  Administered 2018-09-25: 13:00:00 10 mg via INTRAMUSCULAR

## 2018-09-25 NOTE — Progress Notes (Signed)
Subjective:    Patient ID: Dorothy Church, female    DOB: [birth year], 83 y.o.   MRN: [id number]  HPI  Pt presents to the clinic today with c/o a rash. She noticed this 4 days ago. The rash is located on her arms and underneath her right breast. The rash is itchy but it has not seemed to spread. She went to [facility] on Tuesday, was diagnosed with Poison Ivy, given Kenalog cream which is not providing any relief. She has not tried anything OTC.  Review of Systems  Past Medical History:  Diagnosis Date  . Abdominal pain   . Abnormal CT of liver   . Anxiety   . Aortic stenosis   . Aortic valve disorders   . Arthritis   . Asthma   . Barrett's esophagus   . Colonic polyp   . COPD (chronic obstructive pulmonary disease) ([place])   . Depressive disorder   . Diverticulosis   . Fundic gland polyps of stomach, benign   . GERD (gastroesophageal reflux disease)   . Goiter   . History of hiatal hernia   . Hypercholesteremia   . Hypothyroidism   . Impaired fasting glucose   . Intrinsic asthma, unspecified   . Left rib fracture   . Obstructive sleep apnea (adult) (pediatric)   . OSA (obstructive sleep apnea)   . Osteoporosis   . Pneumonia   . Pure hypercholesterolemia   . Syncope and collapse   . TIA (transient ischemic attack)   . Vitamin D deficiency     Current Outpatient Medications  Medication Sig Dispense Refill  . acetaminophen (TYLENOL) 500 MG tablet Take 500 mg by mouth every 6 (six) hours as needed.    Marland Kitchen albuterol (VENTOLIN HFA) 108 (90 BASE) MCG/ACT inhaler Inhale 2 puffs into the lungs every 6 (six) hours as needed for wheezing. 18 g 5  . aspirin 81 MG chewable tablet Chew by mouth daily.    . budesonide-formoterol (SYMBICORT) 160-4.5 MCG/ACT inhaler Inhale 2 puffs into the lungs 2 (two) times daily. 3 Inhaler 3  . clindamycin (CLEOCIN) 150 MG capsule TAKE 4 CAPSULES BY MOUTH 1 HOUR BEDFORE DENTAL APPOINTMENT  0  . denosumab (PROLIA) 60 MG/ML SOSY injection Inject into the  skin.    Marland Kitchen diclofenac sodium (VOLTAREN) 1 % GEL Apply 4 g topically 4 (four) times daily. (Patient taking differently: Apply 4 g topically 4 (four) times daily as needed (for pain). ) 5 Tube 11  . diphenhydramine-acetaminophen (TYLENOL PM) 25-500 MG TABS tablet Take 1 tablet by mouth at bedtime as needed (for sleep).    . docusate sodium (COLACE) 50 MG capsule Take 50 mg by mouth daily.    . Lactobacillus Rhamnosus, GG, (CULTURELLE PO) Take 1 capsule by mouth daily.    Marland Kitchen levothyroxine (SYNTHROID) 50 MCG tablet TAKE 1 TABLET BY MOUTH DAILY BEFORE BREAKFAST 90 tablet 0  . mometasone (NASONEX) 50 MCG/ACT nasal spray Place 2 sprays into the nose as needed.    Marland Kitchen PARoxetine (PAXIL) 30 MG tablet Take 1 tablet (30 mg total) by mouth every morning. 90 tablet 3  . Polyvinyl Alcohol-Povidone PF (REFRESH) 1.4-0.6 % SOLN Place 2 drops into both eyes 2 (two) times daily as needed (for dry eyes).    . simvastatin (ZOCOR) 20 MG tablet Take 1 tablet (20 mg total) by mouth at bedtime. 90 tablet 3  . triamcinolone cream (KENALOG) 0.1 %      No current facility-administered medications for this visit.  Allergies  Allergen Reactions  . Amoxicillin Other (See Comments)    Pencillins  . Cefzil [Cefprozil] Other (See Comments)    Unknown  . Cephalexin Other (See Comments)    Unknown  . Propoxyphene Other (See Comments)    Unknown  . Relafen [Nabumetone] Other (See Comments)    Unknown  . Talwin [Pentazocine] Other (See Comments)    Unknown  . Penicillins Other (See Comments)    Cannot remember reaction    Family History  Problem Relation Age of Onset  . Heart disease Father   . Alcohol abuse Father   . Breast cancer Mother   . Bipolar disorder Brother     Social History   Socioeconomic History  . Marital status: Divorced    Spouse name: Not on file  . Number of children: Not on file  . Years of education: Not on file  . Highest education level: Not on file  Occupational History  . Not on  file  Social Needs  . Financial resource strain: Not on file  . Food insecurity    Worry: Not on file    Inability: Not on file  . Transportation needs    Medical: Not on file    Non-medical: Not on file  Tobacco Use  . Smoking status: Never Smoker  . Smokeless tobacco: Never Used  Substance and Sexual Activity  . Alcohol use: Yes    Alcohol/week: 1.0 standard drinks    Types: 1 Glasses of wine per week    Comment: occas. wine  . Drug use: No  . Sexual activity: Never  Lifestyle  . Physical activity    Days per week: Not on file    Minutes per session: Not on file  . Stress: Not on file  Relationships  . Social Herbalist on phone: Not on file    Gets together: Not on file    Attends religious service: Not on file    Active member of club or organization: Not on file    Attends meetings of clubs or organizations: Not on file    Relationship status: Not on file  . Intimate partner violence    Fear of current or ex partner: Not on file    Emotionally abused: Not on file    Physically abused: Not on file    Forced sexual activity: Not on file  Other Topics Concern  . Not on file  Social History Narrative   ** Merged History Encounter **       Lived in [place], moved to [place] two years ago.   Divorced.      Has a living will.   Would desire CPR, would not desire extraordinary measures.           Constitutional: Denies fever, malaise, fatigue, headache or abrupt weight changes.  Respiratory: Denies difficulty breathing, shortness of breath, cough or sputum production.   Cardiovascular: Denies chest pain, chest tightness, palpitations or swelling in the hands or feet.  Skin: Pt reports rash. Denies  ulcercations.    No other specific complaints in a complete review of systems (except as listed in HPI above).     Objective:   Physical Exam  BP 116/68   Pulse 93   Temp (!) 97.2 F (36.2 C) (Temporal)   Wt 145 lb (65.8 kg)   SpO2  97%   BMI 23.40 kg/m  Wt Readings from Last 3 Encounters:  09/25/18 145 lb (65.8 kg)  04/30/18 146 lb 8 oz (66.5 kg)  04/28/18 146 lb 14.4 oz (66.6 kg)    General: Appears her stated age, well developed, well nourished in NAD. Skin: Vesicular rash with honey colored crusting on erythematous base noted of bilateral forearms, underneath right breast. Pulmonary/Chest: Normal effort and positive vesicular breath sounds. No respiratory distress. No wheezes, rales or ronchi noted.   BMET    Component Value Date/Time   NA 141 04/28/2018 1228   NA 144 03/01/2014 1053   K 4.3 04/28/2018 1228   K 3.9 03/01/2014 1053   CL 104 04/28/2018 1228   CL 108 (H) 03/01/2014 1053   CO2 31 04/28/2018 1228   CO2 30 03/01/2014 1053   GLUCOSE 88 04/28/2018 1228   GLUCOSE 90 03/01/2014 1053   BUN 15 04/28/2018 1228   BUN 10 03/01/2014 1053   CREATININE 0.80 04/28/2018 1228   CREATININE 0.75 03/01/2014 1053   CALCIUM 8.9 04/28/2018 1228   CALCIUM 8.3 (L) 03/01/2014 1053   GFRNONAA >60 09/15/2017 0529   GFRNONAA >60 03/01/2014 1053   GFRAA >60 09/15/2017 0529   GFRAA >60 03/01/2014 1053    Lipid Panel     Component Value Date/Time   CHOL 158 04/28/2018 1228   TRIG 139.0 04/28/2018 1228   HDL 59.10 04/28/2018 1228   CHOLHDL 3 04/28/2018 1228   VLDL 27.8 04/28/2018 1228   LDLCALC 71 04/28/2018 1228    CBC    Component Value Date/Time   WBC 5.6 04/28/2018 1228   RBC 4.73 04/28/2018 1228   HGB 13.9 04/28/2018 1228   HGB 14.0 03/01/2014 1053   HCT 41.9 04/28/2018 1228   HCT 42.6 03/01/2014 1053   PLT 240.0 04/28/2018 1228   PLT 190 03/01/2014 1053   MCV 88.6 04/28/2018 1228   MCV 91 03/01/2014 1053   MCH 31.3 09/15/2017 0529   MCHC 33.2 04/28/2018 1228   RDW 14.2 04/28/2018 1228   RDW 13.3 03/01/2014 1053   LYMPHSABS 1.2 04/28/2018 1228   MONOABS 0.5 04/28/2018 1228   EOSABS 0.3 04/28/2018 1228   BASOSABS 0.0 04/28/2018 1228    Hgb A1C Lab Results  Component Value Date    HGBA1C 5.3 12/12/2016           Assessment & Plan:   Contact Dermatitis due to Plant:  Decadron 10 mg IM today Continue Kenalog cream BID- wash hands after use Ok to take Zyrtec or Benadryl OTC for itching  Return precautions discussed Webb Silversmith, NP

## 2018-09-25 NOTE — Addendum Note (Signed)
Addended by: Lurlean Nanny on: 09/25/2018 12:48 PM   Modules accepted: Orders

## 2018-09-25 NOTE — Patient Instructions (Signed)
Poison Ivy Dermatitis Poison ivy dermatitis is redness and soreness of the skin caused by chemicals in the leaves of the poison ivy plant. You may have very bad itching, swelling, a rash, and blisters. What are the causes?  Touching a poison ivy plant.  Touching something that has the chemical on it. This may include animals or objects that have come in contact with the plant. What increases the risk?  Going outdoors often in wooded or marshy areas.  Going outdoors without wearing protective clothing, such as closed shoes, long pants, and a long-sleeved shirt. What are the signs or symptoms?   Skin redness.  Very bad itching.  A rash that often includes bumps and blisters. ? The rash usually appears 48 hours after exposure, if you have been exposed before. ? If this is the first time you have been exposed, the rash may not appear until a week after exposure.  Swelling. This may occur if the reaction is very bad. Symptoms usually last for 1-2 weeks. The first time you develop this condition, symptoms may last 3-4 weeks. How is this treated? This condition may be treated with:  Hydrocortisone cream or calamine lotion to relieve itching.  Oatmeal baths to soothe the skin.  Medicines, such as over-the-counter antihistamine tablets.  Oral steroid medicine for more severe reactions. Follow these instructions at home: Medicines  Take or apply over-the-counter and prescription medicines only as told by your doctor.  Use hydrocortisone cream or calamine lotion as needed to help with itching. General instructions  Do not scratch or rub your skin.  Put a cold, wet cloth (cold compress) on the affected areas or take baths in cool water. This will help with itching.  Avoid hot baths and showers.  Take oatmeal baths as needed. Use colloidal oatmeal. You can get this at a pharmacy or grocery store. Follow the instructions on the package.  While you have the rash, wash your clothes  right after you wear them.  Keep all follow-up visits as told by your health care provider. This is important. How is this prevented?   Know what poison ivy looks like, so you can avoid it. ? This plant has three leaves with flowering branches on a single stem. ? The leaves are glossy. ? The leaves have uneven edges that come to a point at the front.  If you touch poison ivy, wash your skin with soap and water right away. Be sure to wash under your fingernails.  When hiking or camping, wear long pants, a long-sleeved shirt, tall socks, and hiking boots. You can also use a lotion on your skin that helps to prevent contact with poison ivy.  If you think that your clothes or outdoor gear came in contact with poison ivy, rinse them off with a garden hose before you bring them inside your house.  When doing yard work or gardening, wear gloves, long sleeves, long pants, and boots. Wash your garden tools and gloves if they come in contact with poison ivy.  If you think that your pet has come into contact with poison ivy, wash him or her with pet shampoo and water. Make sure to wear gloves while washing your pet. Contact a doctor if:  You have open sores in the rash area.  You have more redness, swelling, or pain in the rash area.  You have redness that spreads beyond the rash area.  You have fluid, blood, or pus coming from the rash area.  You have a   fever.  You have a rash over a large area of your body.  You have a rash on your eyes, mouth, or genitals.  Your rash does not get better after a few weeks. Get help right away if:  Your face swells or your eyes swell shut.  You have trouble breathing.  You have trouble swallowing. These symptoms may be an emergency. Do not wait to see if the symptoms will go away. Get medical help right away. Call your local emergency services (911 in the [place] Do not drive yourself to the hospital. Summary  Poison ivy dermatitis is redness and  soreness of the skin caused by chemicals in the leaves of the poison ivy plant.  You may have skin redness, very bad itching, swelling, and a rash.  Do not scratch or rub your skin.  Take or apply over-the-counter and prescription medicines only as told by your doctor. This information is not intended to replace advice given to you by your health care provider. Make sure you discuss any questions you have with your health care provider. Document Released: 03/10/2010 Document Revised: 05/30/2018 Document Reviewed: 01/31/2018 Elsevier Patient Education  2020 [employer]vier Inc.  

## 2018-09-30 ENCOUNTER — Other Ambulatory Visit: Payer: Self-pay | Admitting: Otolaryngology

## 2018-09-30 ENCOUNTER — Other Ambulatory Visit (HOSPITAL_COMMUNITY): Payer: Self-pay | Admitting: Otolaryngology

## 2018-09-30 DIAGNOSIS — I6502 Occlusion and stenosis of left vertebral artery: Secondary | ICD-10-CM

## 2018-09-30 DIAGNOSIS — G450 Vertebro-basilar artery syndrome: Secondary | ICD-10-CM | POA: Diagnosis not present

## 2018-10-01 ENCOUNTER — Ambulatory Visit (INDEPENDENT_AMBULATORY_CARE_PROVIDER_SITE_OTHER): Payer: Medicare HMO

## 2018-10-01 DIAGNOSIS — M810 Age-related osteoporosis without current pathological fracture: Secondary | ICD-10-CM

## 2018-10-01 MED ORDER — DENOSUMAB 60 MG/ML ~~LOC~~ SOSY
60.0000 mg | PREFILLED_SYRINGE | Freq: Once | SUBCUTANEOUS | Status: AC
  Administered 2018-10-01: 60 mg via SUBCUTANEOUS

## 2018-10-01 NOTE — Progress Notes (Signed)
Per orders of Clarene Reamer, NP injection of Prolia given by Kris Mouton. Patient tolerated injection well.

## 2018-10-02 DIAGNOSIS — T84098D Other mechanical complication of other internal joint prosthesis, subsequent encounter: Secondary | ICD-10-CM | POA: Diagnosis not present

## 2018-10-02 DIAGNOSIS — S82141D Displaced bicondylar fracture of right tibia, subsequent encounter for closed fracture with routine healing: Secondary | ICD-10-CM | POA: Diagnosis not present

## 2018-10-02 DIAGNOSIS — Z96652 Presence of left artificial knee joint: Secondary | ICD-10-CM | POA: Diagnosis not present

## 2018-10-07 ENCOUNTER — Other Ambulatory Visit: Payer: Self-pay

## 2018-10-07 ENCOUNTER — Ambulatory Visit
Admission: RE | Admit: 2018-10-07 | Discharge: 2018-10-07 | Disposition: A | Discharge status: Home / Self Care | Payer: Medicare HMO | Source: Ambulatory Visit | Attending: Otolaryngology | Admitting: Otolaryngology

## 2018-10-07 DIAGNOSIS — I6502 Occlusion and stenosis of left vertebral artery: Secondary | ICD-10-CM | POA: Diagnosis not present

## 2018-10-07 DIAGNOSIS — I6621 Occlusion and stenosis of right posterior cerebral artery: Secondary | ICD-10-CM | POA: Diagnosis not present

## 2018-10-07 LAB — POCT I-STAT CREATININE: Creatinine, Ser: 0.8 mg/dL (ref 0.44–1.00)

## 2018-10-07 MED ORDER — IOHEXOL 350 MG/ML SOLN
75.0000 mL | Freq: Once | INTRAVENOUS | Status: AC | PRN
  Administered 2018-10-07: 75 mL via INTRAVENOUS

## 2018-10-08 ENCOUNTER — Other Ambulatory Visit (HOSPITAL_COMMUNITY): Payer: Self-pay | Admitting: Interventional Radiology

## 2018-10-08 ENCOUNTER — Telehealth (HOSPITAL_COMMUNITY): Payer: Self-pay

## 2018-10-08 DIAGNOSIS — G450 Vertebro-basilar artery syndrome: Secondary | ICD-10-CM

## 2018-10-08 NOTE — Telephone Encounter (Signed)
Called to schedule angio, no answer, left vm. AW  

## 2018-10-11 ENCOUNTER — Encounter: Payer: Self-pay | Admitting: Family Medicine

## 2018-10-13 ENCOUNTER — Ambulatory Visit: Payer: Medicare HMO | Admitting: Surgery

## 2018-10-13 ENCOUNTER — Other Ambulatory Visit: Payer: Self-pay

## 2018-10-13 ENCOUNTER — Encounter: Payer: Self-pay | Admitting: Surgery

## 2018-10-13 VITALS — BP 105/70 | HR 96 | Temp 97.5°F | Ht 66.0 in | Wt 144.0 lb

## 2018-10-13 DIAGNOSIS — K449 Diaphragmatic hernia without obstruction or gangrene: Secondary | ICD-10-CM

## 2018-10-13 DIAGNOSIS — R1310 Dysphagia, unspecified: Secondary | ICD-10-CM

## 2018-10-13 NOTE — Patient Instructions (Signed)
Ct scan 10/20/2018 @ 10:30 outpatient . Pick up prep and nothing to get or drink four hours prior.

## 2018-10-14 NOTE — Progress Notes (Signed)
Patient ID: Dorothy Church, female   DOB: February 28, 1935, 83 y.o.   MRN: [id number]  HPI Dorothy Church is a 83 y.o. female seen in consultation at the request of Dr. Lysle Pearl for work-up and management of reflux as well as hiatal hernia.  Have some epigastric pain that is intermittent, moderate in intensity burning type and gets aggravated with certain foods.  She reports that when she lays down her symptoms worsens.  He has started on PPI and this seems to improve her symptoms. She endorses some dysphagia for certain liquids.  She is able to eat solid food without any issue..  She does have a history of Barrett's esophagus and did have an EGD by Dr. Gustavo Lah that I have personally review showing evidence of gastritis and a small hiatal hernia.  Did have a HIDA scan as well showing normal function of the gallbladder.  She does have cholelithiasis on ultrasound.  Please note that I have personally review all the images. Endorses that lately she does have some positional vertigo.  And she has been work-up to include an angiogram showing no acute disease. She is very functional for an 83 year old female.  She is independent and does all ADLs.  She is able to perform more than 4 METS of activity without any shortness of breath or chest pain.  Did have a CBC and a CMP that is completely normal.  HPI  Past Medical History:  Diagnosis Date  . Abdominal pain   . Abnormal CT of liver   . Anxiety   . Aortic stenosis   . Aortic valve disorders   . Arthritis   . Asthma   . Barrett's esophagus   . Colonic polyp   . COPD (chronic obstructive pulmonary disease) ([place])   . Depressive disorder   . Diverticulosis   . Fundic gland polyps of stomach, benign   . GERD (gastroesophageal reflux disease)   . Goiter   . History of hiatal hernia   . Hypercholesteremia   . Hypothyroidism   . Impaired fasting glucose   . Intrinsic asthma, unspecified   . Left rib fracture   . Obstructive sleep apnea (adult)  (pediatric)   . OSA (obstructive sleep apnea)   . Osteoporosis   . Pneumonia   . Pure hypercholesterolemia   . Syncope and collapse   . TIA (transient ischemic attack)   . Vitamin D deficiency     Past Surgical History:  Procedure Laterality Date  . BREAST EXCISIONAL BIOPSY Left   . COLONOSCOPY    . COLONOSCOPY WITH ESOPHAGOGASTRODUODENOSCOPY (EGD)    . COLONOSCOPY WITH PROPOFOL N/A 12/14/2014   Procedure: COLONOSCOPY WITH PROPOFOL;  Surgeon: Lollie Sails, MD;  Location: [facility] ENDOSCOPY;  Service: Endoscopy;  Laterality: N/A;  . COLONOSCOPY WITH PROPOFOL N/A 03/18/2018   Procedure: COLONOSCOPY WITH PROPOFOL;  Surgeon: Lollie Sails, MD;  Location: [facility] ENDOSCOPY;  Service: Endoscopy;  Laterality: N/A;  . ENUCLEATION    . ESOPHAGOGASTRODUODENOSCOPY (EGD) WITH PROPOFOL N/A 12/14/2014   Procedure: ESOPHAGOGASTRODUODENOSCOPY (EGD) WITH PROPOFOL;  Surgeon: Lollie Sails, MD;  Location: [facility] ENDOSCOPY;  Service: Endoscopy;  Laterality: N/A;  . ESOPHAGOGASTRODUODENOSCOPY (EGD) WITH PROPOFOL N/A 10/18/2016   Procedure: ESOPHAGOGASTRODUODENOSCOPY (EGD) WITH PROPOFOL;  Surgeon: Lollie Sails, MD;  Location: [facility] ENDOSCOPY;  Service: Endoscopy;  Laterality: N/A;  . ESOPHAGOGASTRODUODENOSCOPY (EGD) WITH PROPOFOL N/A 03/18/2018   Procedure: ESOPHAGOGASTRODUODENOSCOPY (EGD) WITH PROPOFOL;  Surgeon: Lollie Sails, MD;  Location: [facility] ENDOSCOPY;  Service: Endoscopy;  Laterality: N/A;  .  EYE SURGERY    . HIP SURGERY Left    pin and plate  . Left Total Knee Arthroplasty    . prosthesis eye implant    . REPLACEMENT UNICONDYLAR JOINT KNEE Left   . TONSILLECTOMY    . TOTAL KNEE REVISION Left 09/13/2017   Procedure: TOTAL KNEE REVISION;  Surgeon: Dereck Leep, MD;  Location: [facility] ORS;  Service: Orthopedics;  Laterality: Left;    Family History  Problem Relation Age of Onset  . Heart disease Father   . Alcohol abuse Father   . Breast cancer Mother   . Bipolar disorder Brother      Social History Social History   Tobacco Use  . Smoking status: Never Smoker  . Smokeless tobacco: Never Used  Substance Use Topics  . Alcohol use: Yes    Alcohol/week: 1.0 standard drinks    Types: 1 Glasses of wine per week    Comment: occas. wine  . Drug use: No    Allergies  Allergen Reactions  . Amoxicillin Other (See Comments)    Pencillins  . Cefzil [Cefprozil] Other (See Comments)    Unknown  . Cephalexin Other (See Comments)    Unknown  . Propoxyphene Other (See Comments)    Unknown  . Relafen [Nabumetone] Other (See Comments)    Unknown  . Talwin [Pentazocine] Other (See Comments)    Unknown  . Penicillins Other (See Comments)    Cannot remember reaction    Current Outpatient Medications  Medication Sig Dispense Refill  . acetaminophen (TYLENOL) 500 MG tablet Take 500 mg by mouth every 6 (six) hours as needed.    Marland Kitchen albuterol (VENTOLIN HFA) 108 (90 BASE) MCG/ACT inhaler Inhale 2 puffs into the lungs every 6 (six) hours as needed for wheezing. 18 g 5  . aspirin 81 MG chewable tablet Chew by mouth daily.    . budesonide-formoterol (SYMBICORT) 160-4.5 MCG/ACT inhaler Inhale 2 puffs into the lungs 2 (two) times daily. 3 Inhaler 3  . clindamycin (CLEOCIN) 150 MG capsule TAKE 4 CAPSULES BY MOUTH 1 HOUR BEDFORE DENTAL APPOINTMENT  0  . denosumab (PROLIA) 60 MG/ML SOSY injection Inject into the skin.    Marland Kitchen diclofenac sodium (VOLTAREN) 1 % GEL Apply 4 g topically 4 (four) times daily. (Patient taking differently: Apply 4 g topically 4 (four) times daily as needed (for pain). ) 5 Tube 11  . diphenhydramine-acetaminophen (TYLENOL PM) 25-500 MG TABS tablet Take 1 tablet by mouth at bedtime as needed (for sleep).    . docusate sodium (COLACE) 50 MG capsule Take 50 mg by mouth daily.    . Lactobacillus Rhamnosus, GG, (CULTURELLE PO) Take 1 capsule by mouth daily.    Marland Kitchen levothyroxine (SYNTHROID) 50 MCG tablet TAKE 1 TABLET BY MOUTH DAILY BEFORE BREAKFAST 90 tablet 0  .  mometasone (NASONEX) 50 MCG/ACT nasal spray Place 2 sprays into the nose as needed.    Marland Kitchen PARoxetine (PAXIL) 30 MG tablet Take 1 tablet (30 mg total) by mouth every morning. 90 tablet 3  . Polyvinyl Alcohol-Povidone PF (REFRESH) 1.4-0.6 % SOLN Place 2 drops into both eyes 2 (two) times daily as needed (for dry eyes).    . simvastatin (ZOCOR) 20 MG tablet Take 1 tablet (20 mg total) by mouth at bedtime. 90 tablet 3  . triamcinolone cream (KENALOG) 0.1 %      No current facility-administered medications for this visit.      Review of Systems Full ROS  was asked and was negative  except for the information on the HPI  Physical Exam Blood pressure 105/70, pulse 96, temperature (!) 97.5 F (36.4 C), temperature source Skin, height 5\' 6"  (1.676 m), weight 144 lb (65.3 kg), SpO2 98 %. CONSTITUTIONAL: NAD EYES: Pupils are equal, round, and reactive to light, Sclera are non-icteric. EARS, NOSE, MOUTH AND THROAT: The oropharynx is clear. The oral mucosa is pink and moist. Hearing is intact to voice. LYMPH NODES:  Lymph nodes in the neck are normal. RESPIRATORY:  Lungs are clear. There is normal respiratory effort, with equal breath sounds bilaterally, and without pathologic use of accessory muscles. CARDIOVASCULAR: Heart is regular without murmurs, gallops, or rubs. GI: The abdomen is  soft, nontender, and nondistended. There are no palpable masses. There is no hepatosplenomegaly. There are normal bowel sounds in all quadrants. GU: Rectal deferred.   MUSCULOSKELETAL: Normal muscle strength and tone. No cyanosis or edema.   SKIN: Turgor is good and there are no pathologic skin lesions or ulcers. NEUROLOGIC: Motor and sensation is grossly normal. Cranial nerves are grossly intact. PSYCH:  Oriented to person, place and time. Affect is normal.  Data Reviewed  I have personally reviewed the patient's imaging, laboratory findings and medical records.    Assessment/ Plan 83 year old female with a  small hiatal hernia that iscausing some reflux symptoms.  She does have significant dizzy spells and I do think that this takes priority over hiatal hernia.  I have had an extensive discussion with the patient regarding her disease process.  We can start with a barium swallow as well as a CT of the chest and abdomen to delineate her anatomy.  I will see her back in a month or 2 once her dizziness improved.  There is no need for emergent surgical intervention at this time.  Does have cholelithiasis but her clinical picture is somewhat not a straightforward for biliary pathology. Copy of this report was sent to the referring provider   Caroleen Hamman, MD FACS General Surgeon 10/14/2018, 1:37 PM

## 2018-10-16 ENCOUNTER — Other Ambulatory Visit: Payer: Self-pay | Admitting: Radiology

## 2018-10-16 ENCOUNTER — Other Ambulatory Visit: Payer: Self-pay | Admitting: Student

## 2018-10-17 ENCOUNTER — Other Ambulatory Visit: Payer: Self-pay

## 2018-10-17 ENCOUNTER — Ambulatory Visit (HOSPITAL_COMMUNITY)
Admission: RE | Admit: 2018-10-17 | Discharge: 2018-10-17 | Disposition: A | Discharge status: Home / Self Care | Payer: Medicare HMO | Source: Ambulatory Visit | Attending: Interventional Radiology | Admitting: Interventional Radiology

## 2018-10-17 ENCOUNTER — Other Ambulatory Visit (HOSPITAL_COMMUNITY): Payer: Self-pay | Admitting: Interventional Radiology

## 2018-10-17 DIAGNOSIS — Z8249 Family history of ischemic heart disease and other diseases of the circulatory system: Secondary | ICD-10-CM | POA: Insufficient documentation

## 2018-10-17 DIAGNOSIS — G450 Vertebro-basilar artery syndrome: Secondary | ICD-10-CM

## 2018-10-17 DIAGNOSIS — Z79890 Hormone replacement therapy (postmenopausal): Secondary | ICD-10-CM | POA: Diagnosis not present

## 2018-10-17 DIAGNOSIS — Z79899 Other long term (current) drug therapy: Secondary | ICD-10-CM | POA: Insufficient documentation

## 2018-10-17 DIAGNOSIS — G4733 Obstructive sleep apnea (adult) (pediatric): Secondary | ICD-10-CM | POA: Insufficient documentation

## 2018-10-17 DIAGNOSIS — Z8673 Personal history of transient ischemic attack (TIA), and cerebral infarction without residual deficits: Secondary | ICD-10-CM | POA: Diagnosis not present

## 2018-10-17 DIAGNOSIS — Z7951 Long term (current) use of inhaled steroids: Secondary | ICD-10-CM | POA: Insufficient documentation

## 2018-10-17 DIAGNOSIS — E7800 Pure hypercholesterolemia, unspecified: Secondary | ICD-10-CM | POA: Diagnosis not present

## 2018-10-17 DIAGNOSIS — R930 Abnormal findings on diagnostic imaging of skull and head, not elsewhere classified: Secondary | ICD-10-CM | POA: Insufficient documentation

## 2018-10-17 DIAGNOSIS — J449 Chronic obstructive pulmonary disease, unspecified: Secondary | ICD-10-CM | POA: Diagnosis not present

## 2018-10-17 DIAGNOSIS — Z881 Allergy status to other antibiotic agents status: Secondary | ICD-10-CM | POA: Diagnosis not present

## 2018-10-17 DIAGNOSIS — M810 Age-related osteoporosis without current pathological fracture: Secondary | ICD-10-CM | POA: Diagnosis not present

## 2018-10-17 DIAGNOSIS — K2270 Barrett's esophagus without dysplasia: Secondary | ICD-10-CM | POA: Insufficient documentation

## 2018-10-17 DIAGNOSIS — E039 Hypothyroidism, unspecified: Secondary | ICD-10-CM | POA: Diagnosis not present

## 2018-10-17 DIAGNOSIS — Z7982 Long term (current) use of aspirin: Secondary | ICD-10-CM | POA: Diagnosis not present

## 2018-10-17 DIAGNOSIS — Z888 Allergy status to other drugs, medicaments and biological substances status: Secondary | ICD-10-CM | POA: Diagnosis not present

## 2018-10-17 DIAGNOSIS — I350 Nonrheumatic aortic (valve) stenosis: Secondary | ICD-10-CM | POA: Insufficient documentation

## 2018-10-17 DIAGNOSIS — K219 Gastro-esophageal reflux disease without esophagitis: Secondary | ICD-10-CM | POA: Diagnosis not present

## 2018-10-17 DIAGNOSIS — M1990 Unspecified osteoarthritis, unspecified site: Secondary | ICD-10-CM | POA: Insufficient documentation

## 2018-10-17 DIAGNOSIS — I6502 Occlusion and stenosis of left vertebral artery: Secondary | ICD-10-CM | POA: Diagnosis not present

## 2018-10-17 DIAGNOSIS — Z880 Allergy status to penicillin: Secondary | ICD-10-CM | POA: Insufficient documentation

## 2018-10-17 DIAGNOSIS — Z96652 Presence of left artificial knee joint: Secondary | ICD-10-CM | POA: Diagnosis not present

## 2018-10-17 HISTORY — PX: IR ANGIOGRAM EXTREMITY LEFT: IMG651

## 2018-10-17 HISTORY — PX: IR ANGIO INTRA EXTRACRAN SEL COM CAROTID INNOMINATE BILAT MOD SED: IMG5360

## 2018-10-17 HISTORY — PX: IR ANGIO VERTEBRAL SEL VERTEBRAL BILAT MOD SED: IMG5369

## 2018-10-17 LAB — BASIC METABOLIC PANEL
Anion gap: 9 (ref 5–15)
BUN: 12 mg/dL (ref 8–23)
CO2: 27 mmol/L (ref 22–32)
Calcium: 8.9 mg/dL (ref 8.9–10.3)
Chloride: 102 mmol/L (ref 98–111)
Creatinine, Ser: 0.8 mg/dL (ref 0.44–1.00)
GFR calc Af Amer: >60 mL/min (ref >60)
GFR calc non Af Amer: >60 mL/min (ref >60)
Glucose, Bld: 95 mg/dL (ref 70–99)
Potassium: 3.4 mmol/L — ABNORMAL LOW (ref 3.5–5.1)
Sodium: 138 mmol/L (ref 135–145)

## 2018-10-17 LAB — CBC
HCT: 42 % (ref 36.0–46.0)
Hemoglobin: 14 g/dL (ref 12.0–15.0)
MCH: 30.3 pg (ref 26.0–34.0)
MCHC: 33.3 g/dL (ref 30.0–36.0)
MCV: 90.9 fL (ref 80.0–100.0)
Platelets: 211 10*3/uL (ref 150–400)
RBC: 4.62 MIL/uL (ref 3.87–5.11)
RDW: 12.6 % (ref 11.5–15.5)
WBC: 5.1 10*3/uL (ref 4.0–10.5)
nRBC: 0 % (ref 0.0–0.2)

## 2018-10-17 LAB — PROTIME-INR
INR: 1 (ref 0.8–1.2)
Prothrombin Time: 13.4 seconds (ref 11.4–15.2)

## 2018-10-17 MED ORDER — LIDOCAINE HCL (PF) 1 % IJ SOLN
INTRAMUSCULAR | Status: AC | PRN
  Administered 2018-10-17: 10 mL

## 2018-10-17 MED ORDER — VERAPAMIL HCL 2.5 MG/ML IV SOLN
INTRAVENOUS | Status: AC
  Filled 2018-10-17: qty 2

## 2018-10-17 MED ORDER — IOHEXOL 300 MG/ML  SOLN
150.0000 mL | Freq: Once | INTRAMUSCULAR | Status: AC | PRN
  Administered 2018-10-17: 50 mL via INTRA_ARTERIAL

## 2018-10-17 MED ORDER — FENTANYL CITRATE (PF) 100 MCG/2ML IJ SOLN
INTRAMUSCULAR | Status: AC
  Filled 2018-10-17: qty 2

## 2018-10-17 MED ORDER — NITROGLYCERIN 1 MG/10 ML FOR IR/CATH LAB
INTRA_ARTERIAL | Status: AC
  Filled 2018-10-17: qty 10

## 2018-10-17 MED ORDER — SODIUM CHLORIDE 0.9 % IV SOLN
INTRAVENOUS | Status: AC
  Administered 2018-10-17: 11:00:00 via INTRAVENOUS

## 2018-10-17 MED ORDER — LIDOCAINE HCL 1 % IJ SOLN
INTRAMUSCULAR | Status: AC
  Filled 2018-10-17: qty 20

## 2018-10-17 MED ORDER — MIDAZOLAM HCL 2 MG/2ML IJ SOLN
INTRAMUSCULAR | Status: AC | PRN
  Administered 2018-10-17: 1 mg via INTRAVENOUS

## 2018-10-17 MED ORDER — HEPARIN SODIUM (PORCINE) 1000 UNIT/ML IJ SOLN
INTRAMUSCULAR | Status: AC
  Filled 2018-10-17: qty 1

## 2018-10-17 MED ORDER — IOHEXOL 300 MG/ML  SOLN
150.0000 mL | Freq: Once | INTRAMUSCULAR | Status: AC | PRN
  Administered 2018-10-17: 66 mL via INTRA_ARTERIAL

## 2018-10-17 MED ORDER — MIDAZOLAM HCL 2 MG/2ML IJ SOLN
INTRAMUSCULAR | Status: AC
  Filled 2018-10-17: qty 2

## 2018-10-17 MED ORDER — FENTANYL CITRATE (PF) 100 MCG/2ML IJ SOLN
INTRAMUSCULAR | Status: AC | PRN
  Administered 2018-10-17: 25 ug via INTRAVENOUS

## 2018-10-17 MED ORDER — HEPARIN SODIUM (PORCINE) 1000 UNIT/ML IJ SOLN
INTRAMUSCULAR | Status: AC | PRN
  Administered 2018-10-17: 1000 [IU] via INTRAVENOUS

## 2018-10-17 MED ORDER — SODIUM CHLORIDE 0.9 % IV SOLN
Freq: Once | INTRAVENOUS | Status: AC
  Administered 2018-10-17: 09:00:00 via INTRAVENOUS

## 2018-10-17 NOTE — Sedation Documentation (Signed)
Pressure released from right fem site by IR Tech, gauze and tegaderm dressing applied by IR Tech

## 2018-10-17 NOTE — Procedures (Signed)
S/P   4 vessel cerebral arterriogram RT CFA approach  Findings. 1.No occlusions,stenoses dissections or intraluminal filling dfects noted. S.Shelitha Magley MD

## 2018-10-17 NOTE — Sedation Documentation (Signed)
Right fem sheath removed and 10fr exoseal deployed, pressure to right fem site, per IR [employer]

## 2018-10-17 NOTE — H&P (Signed)
Chief Complaint: Patient was seen in consultation today for left VA stenosis/diagnostic cerebral arteriogram.  Referring Physician(s): Vaught, Jeannie Fend  Supervising Physician: Luanne Bras  Patient Status: [facility] - Out-pt  History of Present Illness: Dorothy Church is a 83 y.o. female with a past medical history of pure hypercholesterolemia, aortic stenosis, TIA, syncope, asthma, COPD, pneumonia, Barrett's esophagus, hiatal hernia, GERD, diverticulosis, hypothyroidism, vitamin D deficiency, osteoporosis, arthritis, OSA, depression, and anxiety. She has had intermittent dizziness for the past few weeks. She went to her PCP for further management who ordered imaging scans for further evaluation.  CTA head/neck 10/07/2018: 1. No evidence of acute intracranial abnormality. 2. Mild generalized parenchymal atrophy with chronic small vessel ischemic disease. 3. The caudal extent of intracranial vertebral arteries is not included in the field of view. At the very caudal extent of the field of view, a diminutive left vertebral artery is seen. More cephalad, this vessel is poorly delineated and may be severely stenotic or occluded. Of note, the left vertebral artery was not visualized on prior carotid duplex 12/11/2016. 4. Otherwise, no evidence of large vessel occlusion or proximal high-grade arterial stenosis. Mild segmental stenosis of the P2 right posterior cerebral artery. 5. Short-segment fenestration within the proximal basilar artery.  IR requested by Dr. Pryor Ochoa for possible image-guided diagnostic cerebral arteriogram to further evaluation her left VA stenosis seen on CTA 10/07/2018. Patient awake and alert laying in bed. Complains of dyspnea, states this is at baseline (asthma/COPD). Complains of dizziness, stable for the past 3 weeks. Denies fever, chills, chest pain, abdominal pain, headache, or vision changes.   Past Medical History:  Diagnosis Date   Abdominal pain     Abnormal CT of liver    Anxiety    Aortic stenosis    Aortic valve disorders    Arthritis    Asthma    Barrett's esophagus    Colonic polyp    COPD (chronic obstructive pulmonary disease) (HCC)    Depressive disorder    Diverticulosis    Fundic gland polyps of stomach, benign    GERD (gastroesophageal reflux disease)    Goiter    History of hiatal hernia    Hypercholesteremia    Hypothyroidism    Impaired fasting glucose    Intrinsic asthma, unspecified    Left rib fracture    Obstructive sleep apnea (adult) (pediatric)    OSA (obstructive sleep apnea)    Osteoporosis    Pneumonia    Pure hypercholesterolemia    Syncope and collapse    TIA (transient ischemic attack)    Vitamin D deficiency     Past Surgical History:  Procedure Laterality Date   BREAST EXCISIONAL BIOPSY Left    COLONOSCOPY     COLONOSCOPY WITH ESOPHAGOGASTRODUODENOSCOPY (EGD)     COLONOSCOPY WITH PROPOFOL N/A 12/14/2014   Procedure: COLONOSCOPY WITH PROPOFOL;  Surgeon: Lollie Sails, MD;  Location: [facility] ENDOSCOPY;  Service: Endoscopy;  Laterality: N/A;   COLONOSCOPY WITH PROPOFOL N/A 03/18/2018   Procedure: COLONOSCOPY WITH PROPOFOL;  Surgeon: Lollie Sails, MD;  Location: [facility] ENDOSCOPY;  Service: Endoscopy;  Laterality: N/A;   ENUCLEATION     ESOPHAGOGASTRODUODENOSCOPY (EGD) WITH PROPOFOL N/A 12/14/2014   Procedure: ESOPHAGOGASTRODUODENOSCOPY (EGD) WITH PROPOFOL;  Surgeon: Lollie Sails, MD;  Location: [facility] ENDOSCOPY;  Service: Endoscopy;  Laterality: N/A;   ESOPHAGOGASTRODUODENOSCOPY (EGD) WITH PROPOFOL N/A 10/18/2016   Procedure: ESOPHAGOGASTRODUODENOSCOPY (EGD) WITH PROPOFOL;  Surgeon: Lollie Sails, MD;  Location: [facility] ENDOSCOPY;  Service: Endoscopy;  Laterality: N/A;  ESOPHAGOGASTRODUODENOSCOPY (EGD) WITH PROPOFOL N/A 03/18/2018   Procedure: ESOPHAGOGASTRODUODENOSCOPY (EGD) WITH PROPOFOL;  Surgeon: Lollie Sails, MD;  Location: [facility]  ENDOSCOPY;  Service: Endoscopy;  Laterality: N/A;   EYE SURGERY     HIP SURGERY Left    pin and plate   Left Total Knee Arthroplasty     prosthesis eye implant     REPLACEMENT UNICONDYLAR JOINT KNEE Left    TONSILLECTOMY     TOTAL KNEE REVISION Left 09/13/2017   Procedure: TOTAL KNEE REVISION;  Surgeon: Dereck Leep, MD;  Location: [facility] ORS;  Service: Orthopedics;  Laterality: Left;    Allergies: Amoxicillin, Cefzil [cefprozil], Cephalexin, Propoxyphene, Relafen [nabumetone], Talwin [pentazocine], and Penicillins  Medications: Prior to Admission medications   Medication Sig Start Date End Date Taking? Authorizing Provider  acetaminophen (TYLENOL) 500 MG tablet Take 500 mg by mouth every 6 (six) hours as needed (PAIN).    Yes [provider]  albuterol (VENTOLIN HFA) 108 (90 BASE) MCG/ACT inhaler Inhale 2 puffs into the lungs every 6 (six) hours as needed for wheezing. 09/02/14  Yes Lucille Passy, MD  aspirin 325 MG tablet Take 325 mg by mouth daily.    Yes [provider]  budesonide-formoterol (SYMBICORT) 160-4.5 MCG/ACT inhaler Inhale 2 puffs into the lungs 2 (two) times daily. 01/06/18  Yes Flora Lipps, MD  calcium carbonate (TUMS - DOSED IN MG ELEMENTAL CALCIUM) 500 MG chewable tablet Chew 1 tablet by mouth 4 (four) times daily as needed for indigestion or heartburn.   Yes [provider]  diphenhydramine-acetaminophen (TYLENOL PM) 25-500 MG TABS tablet Take 1 tablet by mouth at bedtime as needed (for sleep).   Yes [provider]  docusate sodium (COLACE) 50 MG capsule Take 50 mg by mouth daily.   Yes [provider]  famotidine (PEPCID) 20 MG tablet Take 20 mg by mouth at bedtime.   Yes [provider]  fluticasone (FLONASE) 50 MCG/ACT nasal spray Place 1 spray into both nostrils daily as needed for allergies or rhinitis.   Yes [provider]  levothyroxine (SYNTHROID) 50 MCG tablet TAKE 1 TABLET BY MOUTH DAILY  BEFORE BREAKFAST 08/25/18  Yes Baity, Coralie Keens, NP  mometasone (NASONEX) 50 MCG/ACT nasal spray Place 2 sprays into the nose daily as needed (ALLERGIES).    Yes [provider]  PARoxetine (PAXIL) 30 MG tablet Take 1 tablet (30 mg total) by mouth every morning. 05/01/18  Yes Elby Beck, FNP  Polyvinyl Alcohol-Povidone PF (REFRESH) 1.4-0.6 % SOLN Place 2 drops into both eyes 2 (two) times daily as needed (for dry eyes).   Yes [provider]  RABEprazole (ACIPHEX) 20 MG tablet Take 20 mg by mouth daily.   Yes [provider]  simvastatin (ZOCOR) 20 MG tablet Take 1 tablet (20 mg total) by mouth at bedtime. 05/01/18  Yes Elby Beck, FNP  clindamycin (CLEOCIN) 150 MG capsule Take 600 mg by mouth See admin instructions. DENTAL PROCEDURES 11/17/17   [provider]  denosumab (PROLIA) 60 MG/ML SOSY injection Inject 60 mg into the skin every 6 (six) months.     [provider]  diclofenac sodium (VOLTAREN) 1 % GEL Apply 4 g topically 4 (four) times daily. Patient taking differently: Apply 4 g topically 4 (four) times daily as needed (for pain).  02/24/15   CoplandFrederico Hamman, MD     Family History  Problem Relation Age of Onset   Heart disease Father    Alcohol abuse Father  Breast cancer Mother    Bipolar disorder Brother     Social History   Socioeconomic History   Marital status: Divorced    Spouse name: Not on file   Number of children: Not on file   Years of education: Not on file   Highest education level: Not on file  Occupational History   Not on file  Social Needs   Financial resource strain: Not on file   Food insecurity    Worry: Not on file    Inability: Not on file   Transportation needs    Medical: Not on file    Non-medical: Not on file  Tobacco Use   Smoking status: Never Smoker   Smokeless tobacco: Never Used  Substance and Sexual Activity   Alcohol use: Yes    Alcohol/week: 1.0 standard drinks      Types: 1 Glasses of wine per week    Comment: occas. wine   Drug use: No   Sexual activity: Never  Lifestyle   Physical activity    Days per week: Not on file    Minutes per session: Not on file   Stress: Not on file  Relationships   Social connections    Talks on phone: Not on file    Gets together: Not on file    Attends religious service: Not on file    Active member of club or organization: Not on file    Attends meetings of clubs or organizations: Not on file    Relationship status: Not on file  Other Topics Concern   Not on file  Social History Narrative   ** Merged History Encounter **       Lived in [place], moved to [place] two years ago.   Divorced.      Has a living will.   Would desire CPR, would not desire extraordinary measures.           Review of Systems: A 12 point ROS discussed and pertinent positives are indicated in the HPI above.  All other systems are negative.  Review of Systems  Constitutional: Negative for chills and fever.  Eyes: Negative for visual disturbance.  Respiratory: Positive for shortness of breath. Negative for wheezing.   Cardiovascular: Negative for chest pain and palpitations.  Gastrointestinal: Negative for abdominal pain.  Neurological: Positive for dizziness. Negative for headaches.  Psychiatric/Behavioral: Negative for behavioral problems and confusion.    Vital Signs: BP (!) 135/98    Pulse 84    Temp 98.2 F (36.8 C) (Oral)    Resp 18    Ht 5\' 6"  (1.676 m)    Wt 144 lb (65.3 kg)    SpO2 98%    BMI 23.24 kg/m   Physical Exam Vitals signs and nursing note reviewed.  Constitutional:      General: She is not in acute distress.    Appearance: Normal appearance.  Cardiovascular:     Rate and Rhythm: Normal rate and regular rhythm.     Heart sounds: Normal heart sounds. No murmur.  Pulmonary:     Effort: Pulmonary effort is normal. No respiratory distress.     Breath sounds: Normal breath sounds.  No wheezing.  Skin:    General: Skin is warm and dry.  Neurological:     Mental Status: She is alert and oriented to person, place, and time.  Psychiatric:        Mood and Affect: Mood normal.  Behavior: Behavior normal.        Thought Content: Thought content normal.        Judgment: Judgment normal.      MD Evaluation Airway: WNL Heart: WNL Abdomen: WNL Chest/ Lungs: WNL ASA  Classification: 3 Mallampati/Airway Score: Two   Imaging: Ct Angio Head W Or Wo Contrast  Result Date: 10/08/2018 CLINICAL DATA:  Stenosis of left vertebral artery. Additional history: Patient reports dizziness for past 15-20 years. EXAM: CT ANGIOGRAPHY HEAD TECHNIQUE: Multidetector CT imaging of the head was performed using the standard protocol during bolus administration of intravenous contrast. Multiplanar CT image reconstructions and MIPs were obtained to evaluate the vascular anatomy. CONTRAST:  62mL OMNIPAQUE IOHEXOL 350 MG/ML SOLN COMPARISON:  Report for carotid duplex 12/11/2016, MR head 12/11/2016, FINDINGS: CT HEAD Brain: There is no acute intracranial hemorrhage. No evidence of intracranial mass. No midline shift or extra-axial collection. Mild scattered ill-defined hypoattenuation of the cerebral white matter is nonspecific, but consistent with chronic small vessel ischemic disease. Mild generalized parenchymal atrophy. Vascular: Reported separately. Skull: No calvarial fracture. Sinuses: No significant paranasal sinus disease or mastoid effusion at the imaged levels. Orbits: Previous surgical repair of left orbital rim with sequela of prior left eye enucleation. CTA HEAD Anterior circulation: The intracranial internal carotid arteries are patent without significant stenosis. The right middle and anterior cerebral arteries are patent without significant acquired proximal stenosis. Hypoplastic A1 right anterior cerebral artery. The left middle and anterior cerebral arteries are patent without  significant proximal stenosis. No anterior circulation aneurysm is identified. Posterior circulation: The caudal extent of the intracranial vertebral arteries is not included in the field of view. The visualized intracranial right vertebral artery is prominent in size and widely patent. At the very caudal extent of the field of view there is a diminutive left vertebral artery. More cephalad, this vessel is poorly delineated and may be severely stenotic or occluded. There is a short segment fenestration within the proximal basilar artery. The basilar artery is patent without significant stenosis. Predominantly fetal origin of the right posterior cerebral artery. Mild segmental stenosis within the P2 right posterior cerebral artery. The left posterior cerebral artery is patent without significant proximal stenosis. Venous sinuses: Poorly assessed due to arterial phase of contrast. Anatomic variants: A left posterior communicating artery is poorly delineated and may be hypoplastic or absent. Impression number one under the CTA HEAD impression section below will be called to the ordering clinician or representative by the Radiologist Assistant, and communication documented in the PACS or zVision Dashboard. IMPRESSION: CT HEAD: No evidence of acute intracranial abnormality. Mild generalized parenchymal atrophy with chronic small vessel ischemic disease. CTA HEAD: The caudal extent of intracranial vertebral arteries is not included in the field of view. At the very caudal extent of the field of view, a diminutive left vertebral artery is seen. More cephalad, this vessel is poorly delineated and may be severely stenotic or occluded. Of note, the left vertebral artery was not visualized on prior carotid duplex 12/11/2016. Otherwise, no evidence of large vessel occlusion or proximal high-grade arterial stenosis. Mild segmental stenosis of the P2 right posterior cerebral artery. Short-segment fenestration within the proximal  basilar artery. Electronically Signed   By: Kellie Simmering   On: 10/08/2018 16:08   Vas US Carotid  Result Date: 09/23/2018 Carotid Arterial Duplex Study Indications:       Carotid artery disease. Comparison Study:  12/11/2016 Performing Technologist: Almira Coaster RVS  Examination Guidelines: A complete evaluation includes B-mode imaging,  spectral Doppler, color Doppler, and power Doppler as needed of all accessible portions of each vessel. Bilateral testing is considered an integral part of a complete examination. Limited examinations for reoccurring indications may be performed as noted.  Right Carotid Findings: +----------+--------+--------+--------+--------+--------+             PSV cm/s EDV cm/s Stenosis Describe Comments  +----------+--------+--------+--------+--------+--------+  CCA Prox   69       14                                   +----------+--------+--------+--------+--------+--------+  CCA Mid    77       15                                   +----------+--------+--------+--------+--------+--------+  CCA Distal 75       20                                   +----------+--------+--------+--------+--------+--------+  ICA Prox   31       10                                   +----------+--------+--------+--------+--------+--------+  ICA Mid    34       10                                   +----------+--------+--------+--------+--------+--------+  ICA Distal 79       18                                   +----------+--------+--------+--------+--------+--------+  ECA        40       8                                    +----------+--------+--------+--------+--------+--------+ +----------+--------+-------+--------+-------------------+             PSV cm/s EDV cms Describe Arm Pressure (mmHG)  +----------+--------+-------+--------+-------------------+  Subclavian 69       0                                     +----------+--------+-------+--------+-------------------+ +---------+--------+--+--------+--+   Vertebral PSV cm/s 65 EDV cm/s 17  +---------+--------+--+--------+--+  Left Carotid Findings: +----------+--------+--------+--------+--------+--------+             PSV cm/s EDV cm/s Stenosis Describe Comments  +----------+--------+--------+--------+--------+--------+  CCA Prox   81       11                                   +----------+--------+--------+--------+--------+--------+  CCA Mid    76       11                                   +----------+--------+--------+--------+--------+--------+  CCA Distal 77       17                                   +----------+--------+--------+--------+--------+--------+  ICA Prox   44       13                                   +----------+--------+--------+--------+--------+--------+  ICA Mid    84       30                                   +----------+--------+--------+--------+--------+--------+  ICA Distal 39       13                                   +----------+--------+--------+--------+--------+--------+  ECA        87       6                                    +----------+--------+--------+--------+--------+--------+ +----------+--------+--------+--------+-------------------+  Subclavian PSV cm/s EDV cm/s Describe Arm Pressure (mmHG)  +----------+--------+--------+--------+-------------------+             103      0                                      +----------+--------+--------+--------+-------------------+ +---------+--------+-+--------+-+  Vertebral PSV cm/s 0 EDV cm/s 0  +---------+--------+-+--------+-+  Summary: Right Carotid: Velocities in the right ICA are consistent with a 1-39% stenosis. Left Carotid: Velocities in the left ICA are consistent with a 1-39% stenosis. Vertebrals:  Right vertebral artery demonstrates antegrade flow. Left vertebral              artery was not visualized. Subclavians: Normal flow hemodynamics were seen in bilateral subclavian              arteries. *See table(s) above for measurements and observations.  Electronically signed by  Leotis Pain MD on 09/23/2018 at 1:20:32 PM.    Final     Labs:  CBC: Recent Labs    04/28/18 1228 10/17/18 0804  WBC 5.6 5.1  HGB 13.9 14.0  HCT 41.9 42.0  PLT 240.0 211    COAGS: Recent Labs    10/17/18 0804  INR 1.0    BMP: Recent Labs    03/12/18 1106 04/28/18 1228 10/07/18 1547  NA  --  141  --   K  --  4.3  --   CL  --  104  --   CO2  --  31  --   GLUCOSE  --  88  --   BUN  --  15  --   CALCIUM 9.5 8.9  --   CREATININE  --  0.80 0.80    LIVER FUNCTION TESTS: Recent Labs    04/28/18 1228  BILITOT 0.5  AST 15  ALT 13  ALKPHOS 63  PROT 6.7  ALBUMIN 4.1     Assessment and Plan:  Left VA stenosis. Plan for image-guided diagnostic cerebral  arteriogram today with Dr. Estanislado Pandy. Patient is NPO. Afebrile and WBCs WNL. She does not take blood thinners. INR 1.0 today.  Risks and benefits of cerebral arteriogram were discussed with the patient including, but not limited to bleeding, infection, vascular injury, stroke, or contrast induced renal failure. This interventional procedure involves the use of X-rays and because of the nature of the planned procedure, it is possible that we will have prolonged use of X-ray fluoroscopy. Potential radiation risks to you include (but are not limited to) the following: - A slightly elevated risk for cancer  several years later in life. This risk is typically less than 0.5% percent. This risk is low in comparison to the normal incidence of human cancer, which is 33% for women and 50% for men according to the [employer]. - Radiation induced injury can include skin redness, resembling a rash, tissue breakdown / ulcers and hair loss (which can be temporary or permanent).  The likelihood of either of these occurring depends on the difficulty of the procedure and whether you are sensitive to radiation due to previous procedures, disease, or genetic conditions.  IF your procedure requires a prolonged use of radiation,  you will be notified and given written instructions for further action.  It is your responsibility to monitor the irradiated area for the 2 weeks following the procedure and to notify your physician if you are concerned that you have suffered a radiation induced injury.   All of the patient's questions were answered, patient is agreeable to proceed. Consent signed and in chart.   Thank you for this interesting consult.  I greatly enjoyed meeting KORIANN GERRITY and look forward to participating in their care.  A copy of this report was sent to the requesting provider on this date.  Electronically Signed: Earley Abide, PA-C 10/17/2018, 9:21 AM   I spent a total of 40 Minutes in face to face in clinical consultation, greater than 50% of which was counseling/coordinating care for left VA stenosis/diagnostic cerebral arteriogram.

## 2018-10-17 NOTE — Discharge Instructions (Addendum)
Cerebral Angiogram  A cerebral angiogram is a procedure that is used to examine the blood vessels in the brain and neck. In this procedure, contrast dye is injected through a long, thin tube (catheter) into an artery. X-rays are then taken, which show if there is a blockage or problem in a blood vessel. Tell a health care provider about:  Any allergies you have, including allergies to shellfish, contrast dye, or iodine  All medicines you are taking, including vitamins, herbs, eye drops, creams, and over-the-counter medicines.  Any problems you or family members have had with anesthetic medicines.  Any blood disorders you have.  Any surgeries you have had.  Any medical conditions you have.  Whether you are pregnant or may be pregnant.  Whether you are currently breastfeeding. What are the risks? Generally, this is a safe procedure. However, problems may occur, including:  Damage to surrounding nerves, tissues, or structures.  Blood clot.  Inability to remember what happened (amnesia). This is usually temporary.  Weakness, numbness, speech, or vision problems. This is usually temporary.  Stroke.  Kidney injury.  Bleeding or bruising.  Allergic reaction medicines or dyes.  Infection. What happens before the procedure? Staying hydrated Follow instructions from your health care provider about hydration, which may include:  Up to 2 hours before the procedure - you may continue to drink clear liquids, such as water, clear fruit juice, black coffee, and plain tea. Eating and drinking restrictions Follow instructions from your health care provider about eating and drinking, which may include:  8 hours before the procedure - stop eating heavy meals or foods such as meat, fried foods, or fatty foods.  6 hours before the procedure - stop eating light meals or foods, such as toast or cereal.  6 hours before the procedure - stop drinking milk or drinks that contain milk.  2  hours before the procedure - stop drinking clear liquids. General instructions  Ask your health care provider about: ? Changing or stopping your regular medicines. This is especially important if you are taking diabetes medicines or blood thinners. ? Taking medicines such as aspirin or ibuprofen. These medicines can thin your blood. Do not take these medicines before your procedure if your health care provider asks you not to.  You may have blood tests done.  Plan to have someone take you home from the hospital or clinic.  If you will be going home right after the procedure, plan to have someone with you for 24 hours. What happens during the procedure?  To reduce your risk of infection: ? Your health care team will wash or sanitize their hands. ? Your skin will be washed with soap. ? Hair may be removed from the surgical area.  You will lie on your back on an imaging bed with an X-ray machine around you.  Your head will be secured to the bed with a strap or device to help you keep still.  An IV tube will be inserted into one of your veins.  You will be given one or more of the following: ? A medicine to help you relax (sedative). ? A medicine to numb the area (local anesthetic) where the catheter will be inserted. This is usually your groin, leg, or arm.  Your heart rate and other vital signs will be watched carefully. You may have electrodes placed on your chest.  A small cut (incision) will be made where the catheter will be inserted.  The catheter will be inserted into  an artery that leads to the head. You may feel slight pressure.  The catheter will be moved through the body up to your neck and brain. X-ray images will help your health care provider bring the catheter to the correct location.  The dye will be injected into the catheter and will travel to your head or neck area. You may feel a warming or burning sensation or notice a strange taste in your mouth as the dye goes  through your system.  Images will be taken to show how the dye flows through the area.  While the images are being taken, you may be given instructions on breathing, swallowing, moving, or talking.  When the images are finished, the catheter will be slowly removed.  Pressure will be applied to the skin to stop any bleeding. A tight bandage (dressing) or seal will be applied to the skin.  Your IV will be removed. The procedure may vary among health care providers and hospitals. What happens after the procedure?  Your blood pressure, heart rate, breathing rate, and blood oxygen level will be monitored until the medicines you were given have worn off.  You will be asked to lie flat for several hours. The arm or leg where the catheter was inserted will need to be kept straight while you are in the recovery room.  The insertion site will be watched for bleeding and you will be checked often.  You will be instructed to drink plenty of fluids. This will help wash the contrast dye out of your system.  Do not drive for 24 hours if you received a sedative.  It is up to you to get the results of your procedure. Ask your health care provider, or the department that is doing the procedure, when your results will be ready. Summary  A cerebral angiogram is a procedure that is used to examine the blood vessels in the brain and neck.  In this procedure, contrast dye is injected through a long, thin tube (catheter) into an artery. X-rays are then taken, which show if there is a blockage or problem in a blood vessel.  You will be given a sedative to help you relax during the procedure. A local anesthetic will be used to numb the area where the catheter is inserted. You may feel pressure when the catheter is inserted, and you may feel a warm sensation when the dye is injected.  After the procedure, you will be asked to lie flat for several hours. The arm or leg where the catheter was inserted will need  to be kept straight while you are in the recovery room. This information is not intended to replace advice given to you by your health care provider. Make sure you discuss any questions you have with your health care provider. Document Released: 06/22/2013 Document Revised: 01/18/2017 Document Reviewed: 03/12/2016 Elsevier Patient Education  2020 [employer] This sheet gives you information about how to care for yourself after your procedure. Your health care provider may also give you more specific instructions. If you have problems or questions, contact your health care provider. What can I expect after the procedure? After the procedure, it is common to have bruising and tenderness at the catheter insertion area. Follow these instructions at home: Insertion site care  Follow instructions from your health care provider about how to take care of your insertion site. Make sure you: ? Wash your hands with soap and water before you change your  bandage (dressing). If soap and water are not available, use hand sanitizer. ? Change your dressing as told by your health care provider. ? Leave stitches (sutures), skin glue, or adhesive strips in place. These skin closures may need to stay in place for 2 weeks or longer. If adhesive strip edges start to loosen and curl up, you may trim the loose edges. Do not remove adhesive strips completely unless your health care provider tells you to do that.  Do not take baths, swim, or use a hot tub until your health care provider approves.  You may shower 24-48 hours after the procedure or as told by your health care provider. ? Gently wash the site with plain soap and water. ? Pat the area dry with a clean towel. ? Do not rub the site. This may cause bleeding.  Do not apply powder or lotion to the site. Keep the site clean and dry.  Check your insertion site every day for signs of infection. Check for: ? Redness, swelling, or  pain. ? Fluid or blood. ? Warmth. ? Pus or a bad smell. Activity  Rest as told by your health care provider, usually for 1-2 days.  Do not lift anything that is heavier than 10 lbs. (4.5 kg) or as told by your health care provider.  Do not drive for 24 hours if you were given a medicine to help you relax (sedative).  Do not drive or use heavy machinery while taking prescription pain medicine. General instructions   Return to your normal activities as told by your health care provider, usually in about a week. Ask your health care provider what activities are safe for you.  If the catheter site starts bleeding, lie flat and put pressure on the site. If the bleeding does not stop, get help right away. This is a medical emergency.  Drink enough fluid to keep your urine clear or pale yellow. This helps flush the contrast dye from your body.  Take over-the-counter and prescription medicines only as told by your health care provider.  Keep all follow-up visits as told by your health care provider. This is important. Contact a health care provider if:  You have a fever or chills.  You have redness, swelling, or pain around your insertion site.  You have fluid or blood coming from your insertion site.  The insertion site feels warm to the touch.  You have pus or a bad smell coming from your insertion site.  You have bruising around the insertion site.  You notice blood collecting in the tissue around the catheter site (hematoma). The hematoma may be painful to the touch. Get help right away if:  You have severe pain at the catheter insertion area.  The catheter insertion area swells very fast.  The catheter insertion area is bleeding, and the bleeding does not stop when you hold steady pressure on the area.  The area near or just beyond the catheter insertion site becomes pale, cool, tingly, or numb. These symptoms may represent a serious problem that is an emergency. Do not  wait to see if the symptoms will go away. Get medical help right away. Call your local emergency services (911 in the [place] Do not drive yourself to the hospital. Summary  After the procedure, it is common to have bruising and tenderness at the catheter insertion area.  After the procedure, it is important to rest and drink plenty of fluids.  Do not take baths, swim, or use a  hot tub until your health care provider says it is okay to do so. You may shower 24-48 hours after the procedure or as told by your health care provider.  If the catheter site starts bleeding, lie flat and put pressure on the site. If the bleeding does not stop, get help right away. This is a medical emergency. This information is not intended to replace advice given to you by your health care provider. Make sure you discuss any questions you have with your health care provider. Document Released: 08/24/2004 Document Revised: 01/18/2017 Document Reviewed: 01/11/2016 Elsevier Patient Education  2020 [employer].

## 2018-10-20 ENCOUNTER — Ambulatory Visit: Admission: RE | Admit: 2018-10-20 | Payer: Medicare HMO | Source: Ambulatory Visit

## 2018-10-20 ENCOUNTER — Encounter (HOSPITAL_COMMUNITY): Payer: Self-pay | Admitting: Interventional Radiology

## 2018-10-22 ENCOUNTER — Encounter (HOSPITAL_COMMUNITY): Payer: Self-pay

## 2018-10-30 ENCOUNTER — Other Ambulatory Visit (HOSPITAL_COMMUNITY): Payer: Self-pay | Admitting: Interventional Radiology

## 2018-11-07 DIAGNOSIS — H8110 Benign paroxysmal vertigo, unspecified ear: Secondary | ICD-10-CM | POA: Diagnosis not present

## 2018-11-11 DIAGNOSIS — H8111 Benign paroxysmal vertigo, right ear: Secondary | ICD-10-CM | POA: Diagnosis not present

## 2018-11-11 DIAGNOSIS — H6122 Impacted cerumen, left ear: Secondary | ICD-10-CM | POA: Diagnosis not present

## 2018-11-17 ENCOUNTER — Telehealth: Payer: Self-pay | Admitting: Internal Medicine

## 2018-11-17 MED ORDER — AZITHROMYCIN 250 MG PO TABS: ORAL_TABLET | ORAL | 0 refills | Status: AC

## 2018-11-17 MED ORDER — PREDNISONE 10 MG PO TABS: 20.0000 mg | ORAL_TABLET | Freq: Every day | ORAL | 0 refills | Status: DC

## 2018-11-17 NOTE — Telephone Encounter (Signed)
Called and spoke to pt.  Pt reports of increased sob, chest congestion, prod cough with thick white mucus and wheezing x2d. Sx worsened over the night last night, but have improved today.  Pt used ventolin Q3H and symbicort BID with some relief in sx last night. Pt is not currently using any OTC medications.  Denied fever, chills or sweats. Pt is requesting recommendations.  DK please advise. Thanks

## 2018-11-17 NOTE — Telephone Encounter (Signed)
1.Needs COVID testing xcan use [facility] [address] [facility] 2.prednisone 20 mg daily for 10 days 3.z pak

## 2018-11-17 NOTE — Telephone Encounter (Signed)
Pt is aware of recommendations and voiced her understanding.  Rx for prednisone 20 and zpak has been sent to preferred pharmacy.

## 2018-11-18 ENCOUNTER — Other Ambulatory Visit: Payer: Self-pay

## 2018-11-18 DIAGNOSIS — Z20822 Contact with and (suspected) exposure to covid-19: Secondary | ICD-10-CM

## 2018-11-19 LAB — NOVEL CORONAVIRUS, NAA: SARS-CoV-2, NAA: NOT DETECTED

## 2018-11-19 NOTE — Telephone Encounter (Signed)
DK please advise?

## 2018-11-25 NOTE — Telephone Encounter (Signed)
Dr. Kasa please advise. Thanks 

## 2018-11-25 NOTE — Telephone Encounter (Signed)
On 01/03/2018 office note, COPD is listed as a diagnosis.

## 2018-12-03 ENCOUNTER — Encounter: Payer: Medicare HMO | Admitting: Internal Medicine

## 2018-12-04 DIAGNOSIS — R69 Illness, unspecified: Secondary | ICD-10-CM | POA: Diagnosis not present

## 2018-12-04 DIAGNOSIS — H43391 Other vitreous opacities, right eye: Secondary | ICD-10-CM | POA: Diagnosis not present

## 2018-12-08 DIAGNOSIS — R69 Illness, unspecified: Secondary | ICD-10-CM | POA: Diagnosis not present

## 2018-12-15 ENCOUNTER — Ambulatory Visit: Payer: Medicare HMO | Admitting: Surgery

## 2018-12-19 DIAGNOSIS — R69 Illness, unspecified: Secondary | ICD-10-CM | POA: Diagnosis not present

## 2018-12-25 ENCOUNTER — Ambulatory Visit: Payer: Medicare HMO | Admitting: Internal Medicine

## 2018-12-26 ENCOUNTER — Other Ambulatory Visit: Payer: Self-pay

## 2018-12-26 NOTE — Addendum Note (Signed)
Encounter addended by: Riley Churches on: 12/26/2018 9:11 AM  Actions taken: Imaging Exam ended, Charge Capture section accepted

## 2018-12-30 ENCOUNTER — Other Ambulatory Visit: Payer: Self-pay

## 2018-12-30 ENCOUNTER — Encounter: Payer: Self-pay | Admitting: Internal Medicine

## 2018-12-30 ENCOUNTER — Other Ambulatory Visit: Payer: Self-pay | Admitting: Internal Medicine

## 2018-12-30 ENCOUNTER — Ambulatory Visit (INDEPENDENT_AMBULATORY_CARE_PROVIDER_SITE_OTHER): Payer: Medicare HMO | Admitting: Internal Medicine

## 2018-12-30 ENCOUNTER — Telehealth: Payer: Self-pay | Admitting: Internal Medicine

## 2018-12-30 VITALS — BP 112/70 | HR 92 | Temp 97.2°F | Ht 66.0 in | Wt 146.6 lb

## 2018-12-30 DIAGNOSIS — Z1231 Encounter for screening mammogram for malignant neoplasm of breast: Secondary | ICD-10-CM | POA: Diagnosis not present

## 2018-12-30 DIAGNOSIS — R05 Cough: Secondary | ICD-10-CM | POA: Diagnosis not present

## 2018-12-30 DIAGNOSIS — R053 Chronic cough: Secondary | ICD-10-CM

## 2018-12-30 DIAGNOSIS — E559 Vitamin D deficiency, unspecified: Secondary | ICD-10-CM | POA: Diagnosis not present

## 2018-12-30 DIAGNOSIS — I6529 Occlusion and stenosis of unspecified carotid artery: Secondary | ICD-10-CM

## 2018-12-30 DIAGNOSIS — Z1389 Encounter for screening for other disorder: Secondary | ICD-10-CM

## 2018-12-30 DIAGNOSIS — G4733 Obstructive sleep apnea (adult) (pediatric): Secondary | ICD-10-CM

## 2018-12-30 DIAGNOSIS — K449 Diaphragmatic hernia without obstruction or gangrene: Secondary | ICD-10-CM

## 2018-12-30 DIAGNOSIS — J45909 Unspecified asthma, uncomplicated: Secondary | ICD-10-CM | POA: Diagnosis not present

## 2018-12-30 DIAGNOSIS — G4700 Insomnia, unspecified: Secondary | ICD-10-CM

## 2018-12-30 DIAGNOSIS — R69 Illness, unspecified: Secondary | ICD-10-CM | POA: Diagnosis not present

## 2018-12-30 DIAGNOSIS — E039 Hypothyroidism, unspecified: Secondary | ICD-10-CM | POA: Diagnosis not present

## 2018-12-30 DIAGNOSIS — J4550 Severe persistent asthma, uncomplicated: Secondary | ICD-10-CM

## 2018-12-30 DIAGNOSIS — J439 Emphysema, unspecified: Secondary | ICD-10-CM

## 2018-12-30 DIAGNOSIS — J449 Chronic obstructive pulmonary disease, unspecified: Secondary | ICD-10-CM | POA: Diagnosis not present

## 2018-12-30 DIAGNOSIS — M810 Age-related osteoporosis without current pathological fracture: Secondary | ICD-10-CM | POA: Diagnosis not present

## 2018-12-30 DIAGNOSIS — K635 Polyp of colon: Secondary | ICD-10-CM | POA: Insufficient documentation

## 2018-12-30 HISTORY — DX: Chronic cough: R05.3

## 2018-12-30 HISTORY — DX: Occlusion and stenosis of unspecified carotid artery: I65.29

## 2018-12-30 LAB — VITAMIN D 25 HYDROXY (VIT D DEFICIENCY, FRACTURES): VITD: 22.77 ng/mL — ABNORMAL LOW (ref 30.00–100.00)

## 2018-12-30 LAB — TSH: TSH: 2.4 u[IU]/mL (ref 0.35–4.50)

## 2018-12-30 MED ORDER — IPRATROPIUM-ALBUTEROL 0.5-2.5 (3) MG/3ML IN SOLN: 3.0000 mL | Freq: Four times a day (QID) | RESPIRATORY_TRACT | 12 refills | Status: DC | PRN

## 2018-12-30 MED ORDER — TRAZODONE HCL 50 MG PO TABS: 25.0000 mg | ORAL_TABLET | Freq: Every evening | ORAL | 11 refills | Status: DC | PRN

## 2018-12-30 NOTE — Patient Instructions (Addendum)
Mucinex DM green label for cough  HINT water   Use duoneb every 4-6 hours as needed  Chronic Obstructive Pulmonary Disease  Chronic obstructive pulmonary disease (COPD) is a long-term (chronic) condition that affects the lungs. COPD is a general term that can be used to describe many different lung problems that cause lung swelling (inflammation) and limit airflow, including chronic bronchitis and emphysema. If you have COPD, your lung function will probably never return to normal. In most cases, it gets worse over time. However, there are steps you can take to slow the progression of the disease and improve your quality of life. What are the causes? This condition may be caused by:  Smoking. This is the most common cause.  Certain genes passed down through families. What increases the risk? The following factors may make you more likely to develop this condition:  Secondhand smoke from cigarettes, pipes, or cigars.  Exposure to chemicals and other irritants such as fumes and dust in the work environment.  Chronic lung conditions or infections. What are the signs or symptoms? Symptoms of this condition include:  Shortness of breath, especially during physical activity.  Chronic cough with a large amount of thick mucus. Sometimes the cough may not have any mucus (dry cough).  Wheezing.  Rapid breaths.  Gray or bluish discoloration (cyanosis) of the skin, especially in your fingers, toes, or lips.  Feeling tired (fatigue).  Weight loss.  Chest tightness.  Frequent infections.  Episodes when breathing symptoms become much worse (exacerbations).  Swelling in the ankles, feet, or legs. This may occur in later stages of the disease. How is this diagnosed? This condition is diagnosed based on:  Your medical history.  A physical exam. You may also have tests, including:  Lung (pulmonary) function tests. This may include a spirometry test, which measures your ability to  exhale properly.  Chest X-ray.  CT scan.  Blood tests. How is this treated? This condition may be treated with:  Medicines. These may include inhaled rescue medicines to treat acute exacerbations as well as long-term, or maintenance, medicines to prevent flare-ups of COPD. ? Bronchodilators help treat COPD by dilating the airways to allow increased airflow and make your breathing more comfortable. ? Steroids can reduce airway inflammation and help prevent exacerbations.  Smoking cessation. If you smoke, your health care provider may ask you to quit, and may also recommend therapy or replacement products to help you quit.  Pulmonary rehabilitation. This may involve working with a team of health care providers and specialists, such as respiratory, occupational, and physical therapists.  Exercise and physical activity. These are beneficial for nearly all people with COPD.  Nutrition therapy to gain weight, if you are underweight.  Oxygen. Supplemental oxygen therapy is only helpful if you have a low oxygen level in your blood (hypoxemia).  Lung surgery or transplant.  Palliative care. This is to help people with COPD feel comfortable when treatment is no longer working. Follow these instructions at home: Medicines  Take over-the-counter and prescription medicines (inhaled or pills) only as told by your health care provider.  Talk to your health care provider before taking any cough or allergy medicines. You may need to avoid certain medicines that dry out your airways. Lifestyle  If you are a smoker, the most important thing that you can do is to stop smoking. Do not use any products that contain nicotine or tobacco, such as cigarettes and e-cigarettes. If you need help quitting, ask your health care  provider. Continuing to smoke will cause the disease to progress faster.  Avoid exposure to things that irritate your lungs, such as smoke, chemicals, and fumes.  Stay active, but  balance activity with periods of rest. Exercise and physical activity will help you maintain your ability to do things you want to do.  Learn and use relaxation techniques to manage stress and to control your breathing.  Get the right amount of sleep and get quality sleep. Most adults need 7 or more hours per night.  Eat healthy foods. Eating smaller, more frequent meals and resting before meals may help you maintain your strength. Controlled breathing Learn and use controlled breathing techniques as directed by your health care provider. Controlled breathing techniques include:  Pursed lip breathing. Start by breathing in (inhaling) through your nose for 1 second. Then, purse your lips as if you were going to whistle and breathe out (exhale) through the pursed lips for 2 seconds.  Diaphragmatic breathing. Start by putting one hand on your abdomen just above your waist. Inhale slowly through your nose. The hand on your abdomen should move out. Then purse your lips and exhale slowly. You should be able to feel the hand on your abdomen moving in as you exhale. Controlled coughing Learn and use controlled coughing to clear mucus from your lungs. Controlled coughing is a series of short, progressive coughs. The steps of controlled coughing are: 1. Lean your head slightly forward. 2. Breathe in deeply using diaphragmatic breathing. 3. Try to hold your breath for 3 seconds. 4. Keep your mouth slightly open while coughing twice. 5. Spit any mucus out into a tissue. 6. Rest and repeat the steps once or twice as needed. General instructions  Make sure you receive all the vaccines that your health care provider recommends, especially the pneumococcal and influenza vaccines. Preventing infection and hospitalization is very important when you have COPD.  Use oxygen therapy and pulmonary rehabilitation if directed to by your health care provider. If you require home oxygen therapy, ask your health care  provider whether you should purchase a pulse oximeter to measure your oxygen level at home.  Work with your health care provider to develop a COPD action plan. This will help you know what steps to take if your condition gets worse.  Keep other chronic health conditions under control as told by your health care provider.  Avoid extreme temperature and humidity changes.  Avoid contact with people who have an illness that spreads from person to person (is contagious), such as viral infections or pneumonia.  Keep all follow-up visits as told by your health care provider. This is important. Contact a health care provider if:  You are coughing up more mucus than usual.  There is a change in the color or thickness of your mucus.  Your breathing is more labored than usual.  Your breathing is faster than usual.  You have difficulty sleeping.  You need to use your rescue medicines or inhalers more often than expected.  You have trouble doing routine activities such as getting dressed or walking around the house. Get help right away if:  You have shortness of breath while you are resting.  You have shortness of breath that prevents you from: ? Being able to talk. ? Performing your usual physical activities.  You have chest pain lasting longer than 5 minutes.  Your skin color is more blue (cyanotic) than usual.  You measure low oxygen saturations for longer than 5 minutes with a  pulse oximeter.  You have a fever.  You feel too tired to breathe normally. Summary  Chronic obstructive pulmonary disease (COPD) is a long-term (chronic) condition that affects the lungs.  Your lung function will probably never return to normal. In most cases, it gets worse over time. However, there are steps you can take to slow the progression of the disease and improve your quality of life.  Treatment for COPD may include taking medicines, quitting smoking, pulmonary rehabilitation, and changes to  diet and exercise. As the disease progresses, you may need oxygen therapy, a lung transplant, or palliative care.  To help manage your condition, do not smoke, avoid exposure to things that irritate your lungs, stay up to date on all vaccines, and follow your health care provider's instructions for taking medicines. This information is not intended to replace advice given to you by your health care provider. Make sure you discuss any questions you have with your health care provider. Document Released: 11/15/2004 Document Revised: 01/18/2017 Document Reviewed: 03/12/2016 Elsevier Patient Education  2020 [employer].

## 2018-12-30 NOTE — Telephone Encounter (Signed)
Lets clarify with Dr. Mortimer Fries as CT chest in 2016 indicated mild emphysema  You likely need pfts  See my chart 12/30/18   [place]

## 2018-12-30 NOTE — Progress Notes (Signed)
Chief Complaint  Patient presents with  . Transitions Of Care   TOC  1.  H/o asthma/allergies, mild COPD never smoker using Symbicort, ventolin. F/u with Dr. Mortimer Fries c/o worsening persistent cough x 2 months with yellow to white phelgm and also worsening wheezing qhs. She does have h/o hiatal hernia on aciphex 20 mg qd and pepcid  2. Hiatal hernia saw Dr. Dahlia Byes to consider surgery but as of now surgery seems invasive and pt does not want surgery for hiatal hernia  3. H/o OSA off cpap as of 12/30/18 she was using this x 10 years but happy to be off per pulm Dr. Mortimer Fries  4. Dysphagia to liquids not dilatation 02/2018 with EGD and barium swallow 01/24/18 did not see obstruction  5. C/o insomnia she has trouble falling asleep and if gets to bed late will sleep late until about 10 am or 11 am and wants medication for sleep  6. Hypothyroidism    Review of Systems  Constitutional: Negative for weight loss.  HENT: Negative for hearing loss.   Eyes: Negative for blurred vision.  Respiratory: Positive for cough, sputum production and wheezing.   Cardiovascular: Negative for chest pain.  Gastrointestinal: Negative for abdominal pain.  Musculoskeletal: Negative for falls.  Skin: Negative for rash.  Neurological: Negative for headaches.  Psychiatric/Behavioral: The patient has insomnia.    Past Medical History:  Diagnosis Date  . Abdominal pain   . Abnormal CT of liver   . Anxiety   . Aortic stenosis   . Aortic valve disorders   . Arthritis   . Asthma   . Barrett's esophagus    [facility] last EGD 02/2018 Dr. Gustavo Lah   . Colon polyps   . COPD (chronic obstructive pulmonary disease) ([place])   . Depressive disorder   . Diverticulosis   . Fundic gland polyps of stomach, benign   . Gastric polyps   . Gastritis   . GERD (gastroesophageal reflux disease)   . Goiter   . Hiatal hernia    small   . Hypercholesteremia   . Hypothyroidism   . Impaired fasting glucose   . Intrinsic asthma, unspecified   .  Left rib fracture   . Obstructive sleep apnea (adult) (pediatric)    used cpap x 10 years per Dr. Mortimer Fries off as of 12/30/18   . OSA (obstructive sleep apnea)    used cpap x 10 years per Dr. Mortimer Fries off as of 12/30/18   . Osteoporosis   . Pneumonia   . Pure hypercholesterolemia   . Schatzki's ring   . Syncope and collapse   . TIA (transient ischemic attack)   . Vitamin D deficiency    Past Surgical History:  Procedure Laterality Date  . BREAST EXCISIONAL BIOPSY Left   . COLONOSCOPY    . COLONOSCOPY WITH ESOPHAGOGASTRODUODENOSCOPY (EGD)    . COLONOSCOPY WITH PROPOFOL N/A 12/14/2014   Procedure: COLONOSCOPY WITH PROPOFOL;  Surgeon: Lollie Sails, MD;  Location: [facility] ENDOSCOPY;  Service: Endoscopy;  Laterality: N/A;  . COLONOSCOPY WITH PROPOFOL N/A 03/18/2018   Procedure: COLONOSCOPY WITH PROPOFOL;  Surgeon: Lollie Sails, MD;  Location[facility] ENDOSCOPY;  Service: Endoscopy;  Laterality: N/A;  . ENUCLEATION    . ESOPHAGOGASTRODUODENOSCOPY (EGD) WITH PROPOFOL N/A 12/14/2014   Procedure: ESOPHAGOGASTRODUODENOSCOPY (EGD) WITH PROPOFOL;  Surgeon: Lollie Sails, MD;  Location: [facility] ENDOSCOPY;  Service: Endoscopy;  Laterality: N/A;  . ESOPHAGOGASTRODUODENOSCOPY (EGD) WITH PROPOFOL N/A 10/18/2016   Procedure: ESOPHAGOGASTRODUODENOSCOPY (EGD) WITH PROPOFOL;  Surgeon: Gustavo Lah,  Billie Ruddy, MD;  Location: [facility] ENDOSCOPY;  Service: Endoscopy;  Laterality: N/A;  . ESOPHAGOGASTRODUODENOSCOPY (EGD) WITH PROPOFOL N/A 03/18/2018   Procedure: ESOPHAGOGASTRODUODENOSCOPY (EGD) WITH PROPOFOL;  Surgeon: Lollie Sails, MD;  Location: [facility] ENDOSCOPY;  Service: Endoscopy;  Laterality: N/A;  . EYE SURGERY    . HIP SURGERY Left    pin and plate  . IR ANGIO INTRA EXTRACRAN SEL COM CAROTID INNOMINATE BILAT MOD SED  10/17/2018  . IR ANGIO VERTEBRAL SEL VERTEBRAL BILAT MOD SED  10/17/2018  . IR ANGIOGRAM EXTREMITY LEFT  10/17/2018  . Left Total Knee Arthroplasty     Dr Marry Guan and also had 39 years ago from  2020   . prosthesis eye implant    . REPLACEMENT UNICONDYLAR JOINT KNEE Left   . TONSILLECTOMY    . TOTAL KNEE REVISION Left 09/13/2017   Procedure: TOTAL KNEE REVISION;  Surgeon: Dereck Leep, MD;  Location: [facility] ORS;  Service: Orthopedics;  Laterality: Left;   Family History  Problem Relation Age of Onset  . Heart disease Father   . Alcohol abuse Father   . Breast cancer Mother   . Bipolar disorder Brother    Social History   Socioeconomic History  . Marital status: Divorced    Spouse name: Not on file  . Number of children: Not on file  . Years of education: Not on file  . Highest education level: Not on file  Occupational History  . Not on file  Social Needs  . Financial resource strain: Not on file  . Food insecurity    Worry: Not on file    Inability: Not on file  . Transportation needs    Medical: Not on file    Non-medical: Not on file  Tobacco Use  . Smoking status: Never Smoker  . Smokeless tobacco: Never Used  Substance and Sexual Activity  . Alcohol use: Yes    Alcohol/week: 1.0 standard drinks    Types: 1 Glasses of wine per week    Comment: occas. wine  . Drug use: No  . Sexual activity: Never  Lifestyle  . Physical activity    Days per week: Not on file    Minutes per session: Not on file  . Stress: Not on file  Relationships  . Social Herbalist on phone: Not on file    Gets together: Not on file    Attends religious service: Not on file    Active member of club or organization: Not on file    Attends meetings of clubs or organizations: Not on file    Relationship status: Not on file  . Intimate partner violence    Fear of current or ex partner: Not on file    Emotionally abused: Not on file    Physically abused: Not on file    Forced sexual activity: Not on file  Other Topics Concern  . Not on file  Social History Narrative   Has twin sister identical lives in [place] does not live with her    Lived in [place],  moved to [place] two years ago.   Divorced.      Has a living will.   Would desire CPR, would not desire extraordinary measures.      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 acetaminophen (TYLENOL) 500 MG tablet Take 500 mg by mouth every 6 (six)  hours as needed (PAIN).   Marland Kitchen albuterol (VENTOLIN HFA) 108 (90 BASE) MCG/ACT inhaler Inhale 2 puffs into the lungs every 6 (six) hours as needed for wheezing.  Marland Kitchen aspirin 325 MG tablet Take 325 mg by mouth daily.   . budesonide-formoterol (SYMBICORT) 160-4.5 MCG/ACT inhaler Inhale 2 puffs into the lungs 2 (two) times daily.  . calcium carbonate (TUMS - DOSED IN MG ELEMENTAL CALCIUM) 500 MG chewable tablet Chew 1 tablet by mouth 4 (four) times daily as needed for indigestion or heartburn.  . clindamycin (CLEOCIN) 150 MG capsule Take 600 mg by mouth See admin instructions. DENTAL PROCEDURES  . denosumab (PROLIA) 60 MG/ML SOSY injection Inject 60 mg into the skin every 6 (six) months.   . diclofenac sodium (VOLTAREN) 1 % GEL Apply 4 g topically 4 (four) times daily. (Patient taking differently: Apply 4 g topically 4 (four) times daily as needed (for pain). )  . diphenhydramine-acetaminophen (TYLENOL PM) 25-500 MG TABS tablet Take 1 tablet by mouth at bedtime as needed (for sleep).  . docusate sodium (COLACE) 50 MG capsule Take 50 mg by mouth daily.  . famotidine (PEPCID) 20 MG tablet Take 20 mg by mouth daily.  Marland Kitchen levothyroxine (SYNTHROID) 50 MCG tablet TAKE 1 TABLET BY MOUTH DAILY BEFORE BREAKFAST  . mometasone (NASONEX) 50 MCG/ACT nasal spray Place 2 sprays into the nose daily as needed (ALLERGIES).   Marland Kitchen PARoxetine (PAXIL) 30 MG tablet Take 1 tablet (30 mg total) by mouth every morning.  . Polyvinyl Alcohol-Povidone PF (REFRESH) 1.4-0.6 % SOLN Place 2 drops into both eyes 2 (two) times daily as needed (for dry eyes).  . predniSONE (DELTASONE) 10 MG tablet Take 2 tablets (20 mg total) by mouth  daily with breakfast.  . RABEprazole (ACIPHEX) 20 MG tablet Take 20 mg by mouth daily.  . simvastatin (ZOCOR) 20 MG tablet Take 1 tablet (20 mg total) by mouth at bedtime.  . [DISCONTINUED] fluticasone (FLONASE) 50 MCG/ACT nasal spray Place 1 spray into both nostrils daily as needed for allergies or rhinitis.   Allergies  Allergen Reactions  . Amoxicillin Other (See Comments)    Pencillins  . Cefzil [Cefprozil] Other (See Comments)    Unknown  . Cephalexin Other (See Comments)    Unknown  . Propoxyphene Other (See Comments)    Unknown  . Relafen [Nabumetone] Other (See Comments)    Unknown  . Talwin [Pentazocine] Other (See Comments)    Unknown  . Penicillins Other (See Comments)    Cannot remember reaction   Recent Results (from the past 2160 hour(s))  I-STAT creatinine     Status: None   Collection Time: 10/07/18  3:47 PM  Result Value Ref Range   Creatinine, Ser 0.80 0.44 - 1.00 mg/dL  Basic metabolic panel     Status: Abnormal   Collection Time: 10/17/18  8:04 AM  Result Value Ref Range   Sodium 138 135 - 145 mmol/L   Potassium 3.4 (L) 3.5 - 5.1 mmol/L   Chloride 102 98 - 111 mmol/L   CO2 27 22 - 32 mmol/L   Glucose, Bld 95 70 - 99 mg/dL   BUN 12 8 - 23 mg/dL   Creatinine, Ser 0.80 0.44 - 1.00 mg/dL   Calcium 8.9 8.9 - 10.3 mg/dL   GFR calc non Af Amer >60 >60 mL/min   GFR calc Af Amer >60 >60 mL/min   Anion gap 9 5 - 15    Comment: Performed at [facility], [place]  [address], [place] [address]  CBC     Status: None   Collection Time: 10/17/18  8:04 AM  Result Value Ref Range   WBC 5.1 4.0 - 10.5 K/uL   RBC 4.62 3.87 - 5.11 MIL/uL   Hemoglobin 14.0 12.0 - 15.0 g/dL   HCT 42.0 36.0 - 46.0 %   MCV 90.9 80.0 - 100.0 fL   MCH 30.3 26.0 - 34.0 pg   MCHC 33.3 30.0 - 36.0 g/dL   RDW 12.6 11.5 - 15.5 %   Platelets 211 150 - 400 K/uL   nRBC 0.0 0.0 - 0.2 %    Comment: Performed at [facility], [place] [address], [place], [place] [address]   Protime-INR     Status: None   Collection Time: 10/17/18  8:04 AM  Result Value Ref Range   Prothrombin Time 13.4 11.4 - 15.2 seconds   INR 1.0 0.8 - 1.2    Comment: (NOTE) INR goal varies based on device and disease states. Performed at [facility], [place] [address] [place], [place] [address]   Novel Coronavirus, NAA ([facility])     Status: None   Collection Time: 11/18/18 12:00 AM   Specimen: Oropharyngeal(OP) collection in vial transport medium   OROPHARYNGEA  TESTING  Result Value Ref Range   SARS-CoV-2, NAA Not Detected Not Detected    Comment: This nucleic acid amplification test was developed and its performance characteristics determined by [employer]. Nucleic acid amplification tests include PCR and TMA. This test has not been FDA cleared or approved. This test has been authorized by FDA under an Emergency Use Authorization (EUA). This test is only authorized for the duration of time the declaration that circumstances exist justifying the authorization of the emergency use of in vitro diagnostic tests for detection of SARS-CoV-2 virus and/or diagnosis of COVID-19 infection under section 564(b)(1) of the Act, 21 U.S.C. [id number]) (1), unless the authorization is terminated or revoked sooner. When diagnostic testing is negative, the possibility of a false negative result should be considered in the context of a patient's recent exposures and the presence of clinical signs and symptoms consistent with COVID-19. An individual without symptoms of COVID-19 and who is not shedding SARS-CoV-2 virus would  expect to have a negative (not detected) result in this assay.    Objective  Body mass index is 23.66 kg/m. Wt Readings from Last 3 Encounters:  12/30/18 146 lb 9.6 oz (66.5 kg)  10/17/18 144 lb (65.3 kg)  10/13/18 144 lb (65.3 kg)   Temp Readings from Last 3 Encounters:  12/30/18 (!) 97.2 F (36.2 C) (Skin)  10/17/18 98.2 F (36.8 C) (Oral)   10/13/18 (!) 97.5 F (36.4 C) (Skin)   BP Readings from Last 3 Encounters:  12/30/18 112/70  10/17/18 112/61  10/13/18 105/70   Pulse Readings from Last 3 Encounters:  12/30/18 92  10/17/18 85  10/13/18 96    Physical Exam Vitals signs and nursing note reviewed.  Constitutional:      Appearance: Normal appearance. She is well-developed and well-groomed.     Comments: +mask on    HENT:     Head: Normocephalic and atraumatic.  Eyes:     Conjunctiva/sclera: Conjunctivae normal.     Pupils: Pupils are equal, round, and reactive to light.  Cardiovascular:     Rate and Rhythm: Normal rate and regular rhythm.     Heart sounds: Normal heart sounds. No murmur.  Pulmonary:     Effort:  Pulmonary effort is normal.     Breath sounds: Wheezing present.     Comments: Wheezing right lung field  Abdominal:     General: Abdomen is flat. Bowel sounds are normal.     Tenderness: There is no abdominal tenderness.  Skin:    General: Skin is warm and dry.  Neurological:     General: No focal deficit present.     Mental Status: She is alert and oriented to person, place, and time. Mental status is at baseline.     Gait: Gait normal.  Psychiatric:        Attention and Perception: Attention and perception normal.        Mood and Affect: Mood and affect normal.        Speech: Speech normal.        Behavior: Behavior normal. Behavior is cooperative.        Thought Content: Thought content normal.        Cognition and Memory: Cognition and memory normal.        Judgment: Judgment normal.     Assessment  Plan  Persistent cough ddx uncontrolled copd/asthma vs other r/o malignancy vs other I.e ILD vs atypical infection less likely due to GI issues - Plan: CT Chest Wo Contrast, ipratropium-albuterol (DUONEB) 0.5-2.5 (3) MG/3ML SOLN Given neb machine today -f/u with Dr. Mortimer Fries 01/2019 will CC him to see if would consider pfts for pt  Osteoporosis, unspecified osteoporosis type, unspecified  pathological fracture presence - Plan: DG Bone Density Last injc 10/01/18 will need another 04/03/19 Prolia  Hypothyroidism, unspecified type - Plan: TSH Cont levo 50 mcg qd   Vitamin D deficiency - Plan: Vitamin D (25 hydroxy)  Insomnia, unspecified type - Plan: traZODone (DESYREL) 25-50 MG qhs prn   HM utd flu shot  prevnar utd  pna 23 had 12/19/18  shingrix had 2/2   Consider Tdap in future   EGD/colonoscopy had 02/2018 [facility] with erosive gastritis, small HH, gastric polpys, schzatski ring, diverticulosis, 3 mm colon polyp and IH  Ordered mammogram  Ordered DEXA h/o osteoporosis on prolia last injection 10/01/2018 due again 04/03/19   Never smoker  Skin no current issues  Provider: Dr. Olivia Mackie McLean-Scocuzza-Internal Medicine

## 2018-12-31 LAB — URINALYSIS, ROUTINE W REFLEX MICROSCOPIC
Bacteria, UA: NONE SEEN /HPF
Bilirubin Urine: NEGATIVE
Glucose, UA: NEGATIVE
Hgb urine dipstick: NEGATIVE
Hyaline Cast: NONE SEEN /LPF
Ketones, ur: NEGATIVE
Leukocytes,Ua: NEGATIVE
Nitrite: NEGATIVE
Specific Gravity, Urine: 1.03 (ref 1.001–1.03)
pH: 6 (ref 5.0–8.0)

## 2019-01-01 NOTE — Progress Notes (Signed)
I will start the process it is same as new patient. Process. Thanks.

## 2019-01-04 ENCOUNTER — Encounter: Payer: Self-pay | Admitting: Internal Medicine

## 2019-01-05 ENCOUNTER — Encounter: Payer: Self-pay | Admitting: Internal Medicine

## 2019-01-07 ENCOUNTER — Telehealth: Payer: Self-pay | Admitting: *Deleted

## 2019-01-07 NOTE — Telephone Encounter (Signed)
Fax has been sent electronically.

## 2019-01-07 NOTE — Telephone Encounter (Signed)
Copied from [place] [phone]. Topic: General - Inquiry >> Jan 07, 2019 11:13 AM Virl Axe D wrote: Reason for CRM: Juliann Pulse with [facility] stated they need pt's [facility] ID number. Please advise. [phone]

## 2019-01-23 ENCOUNTER — Other Ambulatory Visit: Payer: Self-pay

## 2019-01-23 ENCOUNTER — Ambulatory Visit: Payer: Medicare HMO | Admitting: Internal Medicine

## 2019-01-23 ENCOUNTER — Encounter: Payer: Self-pay | Admitting: Internal Medicine

## 2019-01-23 VITALS — BP 130/78 | HR 89 | Temp 97.8°F | Ht 66.5 in | Wt 146.6 lb

## 2019-01-23 DIAGNOSIS — J4520 Mild intermittent asthma, uncomplicated: Secondary | ICD-10-CM | POA: Diagnosis not present

## 2019-01-23 DIAGNOSIS — G4733 Obstructive sleep apnea (adult) (pediatric): Secondary | ICD-10-CM

## 2019-01-23 NOTE — Progress Notes (Signed)
[place] [facility] Consultation      MRN# [id number] EMMAJANE FRYER 07-22-1935   CC: Follow up OSA Follow up ASTHMA  Brief History:  83 year old female past medical history of asthma  Patient states she had asthma during childhood where she had frequent hospitalizations but no intubations. Patient states her breathing is trouble with certain triggers such as mold, dust, damp areas. She was told about 45 years ago she had ASTHMA and was also placed on Symbicort at that time along with as needed albuterol. She has a history of obstructive sleep apnea diagnosed 8 years ago and she's currently on CPAP; she doesn't recall her CPAP settings. Patient states she never smoked tobacco had any secondhand exposure.  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    HPI Follow-up asthma Doing well on once daily Symbicort  No exacerbation at this time No evidence of heart failure at this time No evidence or signs of infection at this time No respiratory distress No fevers, chills, nausea, vomiting, diarrhea No evidence of lower extremity edema No evidence hemoptysis   PATIENT HAS HAD NEGATIVE SLEEP STUDY 1 YEAR AGO PATIENT DOES NOT HAVE OSA      Medication:    Current Outpatient Medications:  .  acetaminophen (TYLENOL) 500 MG tablet, Take 500 mg by mouth every 6 (six) hours as needed (PAIN). , Disp: , Rfl:  .  albuterol (VENTOLIN HFA) 108 (90 BASE) MCG/ACT inhaler, Inhale 2 puffs into the lungs every 6 (six) hours as needed for wheezing., Disp: 18 g, Rfl: 5 .  aspirin 325 MG tablet, Take 325 mg by mouth daily. , Disp: , Rfl:  .  budesonide-formoterol (SYMBICORT) 160-4.5 MCG/ACT inhaler, Inhale 2 puffs into the lungs 2 (two) times daily., Disp: 3 Inhaler, Rfl: 3 .  calcium carbonate (TUMS - DOSED IN MG ELEMENTAL CALCIUM) 500 MG chewable tablet, Chew 1 tablet by mouth 4 (four) times daily as needed  for indigestion or heartburn., Disp: , Rfl:  .  clindamycin (CLEOCIN) 150 MG capsule, Take 600 mg by mouth See admin instructions. DENTAL PROCEDURES, Disp: , Rfl: 0 .  denosumab (PROLIA) 60 MG/ML SOSY injection, Inject 60 mg into the skin every 6 (six) months. , Disp: , Rfl:  .  diclofenac sodium (VOLTAREN) 1 % GEL, Apply 4 g topically 4 (four) times daily. (Patient taking differently: Apply 4 g topically 4 (four) times daily as needed (for pain). ), Disp: 5 Tube, Rfl: 11 .  diphenhydramine-acetaminophen (TYLENOL PM) 25-500 MG TABS tablet, Take 1 tablet by mouth at bedtime as needed (for sleep)., Disp: , Rfl:  .  docusate sodium (COLACE) 50 MG capsule, Take 50 mg by mouth daily., Disp: , Rfl:  .  famotidine (PEPCID) 20 MG tablet, Take 20 mg by mouth daily., Disp: , Rfl:  .  ipratropium-albuterol (DUONEB) 0.5-2.5 (3) MG/3ML SOLN, Take 3 mLs by nebulization every 6 (six) hours as needed., Disp: 360 mL, Rfl: 12 .  levothyroxine (SYNTHROID) 50 MCG tablet, TAKE 1 TABLET BY MOUTH EVERY DAY BEFORE BREAKFAST, Disp: 90 tablet, Rfl: 0 .  mometasone (NASONEX) 50 MCG/ACT nasal spray, Place 2 sprays into the nose daily as needed (ALLERGIES). , Disp: , Rfl:  .  PARoxetine (PAXIL) 30 MG tablet, Take 1 tablet (30 mg total) by mouth every morning., Disp: 90 tablet, Rfl: 3 .  Polyvinyl Alcohol-Povidone PF (REFRESH) 1.4-0.6 % SOLN, Place 2 drops into both  eyes 2 (two) times daily as needed (for dry eyes)., Disp: , Rfl:  .  predniSONE (DELTASONE) 10 MG tablet, Take 2 tablets (20 mg total) by mouth daily with breakfast., Disp: 20 tablet, Rfl: 0 .  RABEprazole (ACIPHEX) 20 MG tablet, Take 20 mg by mouth daily., Disp: , Rfl:  .  simvastatin (ZOCOR) 20 MG tablet, Take 1 tablet (20 mg total) by mouth at bedtime., Disp: 90 tablet, Rfl: 3 .  traZODone (DESYREL) 50 MG tablet, Take 0.5-1 tablets (25-50 mg total) by mouth at bedtime as needed for sleep., Disp: 45 tablet, Rfl: 11    Review of Systems  Constitutional: Negative  for chills and fever.  Eyes: Negative for blurred vision.  Respiratory: Negative for cough, hemoptysis, sputum production and shortness of breath.   Cardiovascular: Negative for chest pain and palpitations.  Gastrointestinal: Negative for heartburn.  Genitourinary: Negative for dysuria.  Musculoskeletal: Negative for myalgias.  Skin: Negative for itching and rash.  Neurological: Negative for dizziness and headaches.  Endo/Heme/Allergies: Does not bruise/bleed easily.  Psychiatric/Behavioral: Negative for depression.      Allergies:  Amoxicillin, Cefzil [cefprozil], Cephalexin, Propoxyphene, Relafen [nabumetone], Talwin [pentazocine], and Penicillins  Physical Examination:  There were no vitals taken for this visit.  Physical Examination:   GENERAL:NAD, no fevers, chills, no weakness no fatigue HEAD: Normocephalic, atraumatic.  EYES: PERLA, EOMI No scleral icterus.  MOUTH: Moist mucosal membrane.  EAR, NOSE, THROAT: Clear without exudates. No external lesions.  NECK: Supple. No thyromegaly.  No JVD.  PULMONARY: CTA B/L no wheezing, rhonchi, crackles CARDIOVASCULAR: S1 and S2. Regular rate and rhythm. No murmurs GASTROINTESTINAL: Soft, nontender, nondistended. Positive bowel sounds.  MUSCULOSKELETAL: No swelling, clubbing, or edema.  NEUROLOGIC: No gross focal neurological deficits. 5/5 strength all extremities SKIN: No ulceration, lesions, rashes, or cyanosis.  PSYCHIATRIC: Insight, judgment intact. -depression -anxiety ALL OTHER ROS ARE NEGATIVE         Assessment and Plan:  83 year old pleasant white female with well-controlled asthma mild intermittent with underlying sleep apnea   Asthma mild intermittent well-controlled Continue Symbicort as prescribed No need for systemic steroids No indication for antibiotics at this time  OSA-PATIENT DOES NOT HAVE SLEEP APNEA SHE HAS NO EVIDENCE OF OSA BASED ON SLEEP STUDY LAST YEAR   COVID-19 EDUCATION: The signs and  symptoms of COVID-19 were discussed with the patient and how to seek care for testing.  The importance of social distancing was discussed today. Hand Washing Techniques and avoid touching face was advised.     MEDICATION ADJUSTMENTS/LABS AND TESTS ORDERED: Continue Symbciort   CURRENT MEDICATIONS REVIEWED AT LENGTH WITH PATIENT TODAY   Patient satisfied with Plan of action and management. All questions answered  Follow up in 1 year    Omaree Fuqua Patricia Pesa, M.D.  [facility]  Medical Director [place] Director [facility] Cardio-Pulmonary Department

## 2019-01-23 NOTE — Patient Instructions (Signed)
Obtain pulmonary function testing Continue Symbicort as prescribed

## 2019-01-23 NOTE — Addendum Note (Signed)
Addended by: Maryanna Shape A on: 01/23/2019 03:56 PM   Modules accepted: Orders

## 2019-01-26 ENCOUNTER — Telehealth: Payer: Self-pay | Admitting: *Deleted

## 2019-01-26 NOTE — Telephone Encounter (Signed)
Submitted authorization for Prolia.

## 2019-01-26 NOTE — Telephone Encounter (Signed)
-----   Message from Delorise Jackson, MD sent at 12/30/2018  1:03 PM EST ----- Prolia injection due 04/03/19 needs to be under my name please

## 2019-01-29 DIAGNOSIS — J45909 Unspecified asthma, uncomplicated: Secondary | ICD-10-CM | POA: Diagnosis not present

## 2019-02-04 DIAGNOSIS — E785 Hyperlipidemia, unspecified: Secondary | ICD-10-CM | POA: Diagnosis not present

## 2019-02-04 DIAGNOSIS — R69 Illness, unspecified: Secondary | ICD-10-CM | POA: Diagnosis not present

## 2019-02-04 DIAGNOSIS — J449 Chronic obstructive pulmonary disease, unspecified: Secondary | ICD-10-CM | POA: Diagnosis not present

## 2019-02-04 DIAGNOSIS — R32 Unspecified urinary incontinence: Secondary | ICD-10-CM | POA: Diagnosis not present

## 2019-02-04 DIAGNOSIS — M8580 Other specified disorders of bone density and structure, unspecified site: Secondary | ICD-10-CM | POA: Diagnosis not present

## 2019-02-04 DIAGNOSIS — K5900 Constipation, unspecified: Secondary | ICD-10-CM | POA: Diagnosis not present

## 2019-02-04 DIAGNOSIS — Z7951 Long term (current) use of inhaled steroids: Secondary | ICD-10-CM | POA: Diagnosis not present

## 2019-02-04 DIAGNOSIS — K219 Gastro-esophageal reflux disease without esophagitis: Secondary | ICD-10-CM | POA: Diagnosis not present

## 2019-02-04 DIAGNOSIS — E039 Hypothyroidism, unspecified: Secondary | ICD-10-CM | POA: Diagnosis not present

## 2019-02-16 ENCOUNTER — Encounter: Payer: Self-pay | Admitting: Internal Medicine

## 2019-02-17 ENCOUNTER — Encounter: Payer: Self-pay | Admitting: Internal Medicine

## 2019-02-21 DIAGNOSIS — R69 Illness, unspecified: Secondary | ICD-10-CM | POA: Diagnosis not present

## 2019-03-01 DIAGNOSIS — J45909 Unspecified asthma, uncomplicated: Secondary | ICD-10-CM | POA: Diagnosis not present

## 2019-03-02 NOTE — Telephone Encounter (Signed)
Dr. Kasa please advise. Thanks 

## 2019-03-02 NOTE — Telephone Encounter (Signed)
What would you like letter to say? Pt has allergy to amoxicillin/ penicillin

## 2019-03-02 NOTE — Telephone Encounter (Signed)
Pt is requesting a written response okaying vaccine due to allergy to show it to the vaccination administrator.  DK, please advise on letter?

## 2019-03-03 ENCOUNTER — Telehealth: Payer: Self-pay

## 2019-03-03 NOTE — Telephone Encounter (Signed)
Prolia benefits discussed. Pt would owe approximately $255.  Pt understands and agrees.

## 2019-03-04 ENCOUNTER — Encounter: Payer: Self-pay | Admitting: Internal Medicine

## 2019-03-04 NOTE — Telephone Encounter (Signed)
Will route to Dr. kasa as an FYI 

## 2019-03-17 ENCOUNTER — Encounter: Payer: Self-pay | Admitting: *Deleted

## 2019-03-23 ENCOUNTER — Telehealth: Payer: Self-pay | Admitting: Internal Medicine

## 2019-03-23 NOTE — Telephone Encounter (Signed)
I called pt and left vm to call ofc to resch. 

## 2019-03-24 ENCOUNTER — Other Ambulatory Visit: Payer: Self-pay

## 2019-03-24 ENCOUNTER — Other Ambulatory Visit: Payer: Self-pay | Admitting: Internal Medicine

## 2019-03-24 MED ORDER — BUDESONIDE-FORMOTEROL FUMARATE 160-4.5 MCG/ACT IN AERO: 2.0000 | INHALATION_SPRAY | Freq: Two times a day (BID) | RESPIRATORY_TRACT | 3 refills | Status: DC

## 2019-03-24 NOTE — Telephone Encounter (Signed)
Pt rescheduled for 05/08/19 @ 2pm.

## 2019-03-24 NOTE — Telephone Encounter (Signed)
Called [facility] to confirm if they need a new rx or a pa. They confirmed they just needed a new rx. Rx was sent and pt made aware. Pt verbalized understanding. Nothing further needed.

## 2019-04-01 DIAGNOSIS — J45909 Unspecified asthma, uncomplicated: Secondary | ICD-10-CM | POA: Diagnosis not present

## 2019-04-02 ENCOUNTER — Encounter: Payer: Self-pay | Admitting: Internal Medicine

## 2019-04-07 ENCOUNTER — Ambulatory Visit: Payer: Medicare HMO

## 2019-04-09 ENCOUNTER — Ambulatory Visit: Payer: Medicare HMO

## 2019-04-14 ENCOUNTER — Ambulatory Visit
Admission: RE | Admit: 2019-04-14 | Discharge: 2019-04-14 | Disposition: A | Discharge status: Home / Self Care | Payer: Medicare HMO | Source: Ambulatory Visit | Attending: Internal Medicine | Admitting: Internal Medicine

## 2019-04-14 DIAGNOSIS — M85852 Other specified disorders of bone density and structure, left thigh: Secondary | ICD-10-CM | POA: Diagnosis not present

## 2019-04-14 DIAGNOSIS — Z1231 Encounter for screening mammogram for malignant neoplasm of breast: Secondary | ICD-10-CM | POA: Insufficient documentation

## 2019-04-14 DIAGNOSIS — M810 Age-related osteoporosis without current pathological fracture: Secondary | ICD-10-CM | POA: Diagnosis not present

## 2019-04-15 ENCOUNTER — Other Ambulatory Visit: Payer: Self-pay

## 2019-04-15 ENCOUNTER — Ambulatory Visit (INDEPENDENT_AMBULATORY_CARE_PROVIDER_SITE_OTHER): Payer: Medicare HMO

## 2019-04-15 DIAGNOSIS — M810 Age-related osteoporosis without current pathological fracture: Secondary | ICD-10-CM

## 2019-04-15 MED ORDER — DENOSUMAB 60 MG/ML ~~LOC~~ SOSY
60.0000 mg | PREFILLED_SYRINGE | Freq: Once | SUBCUTANEOUS | Status: AC
  Administered 2019-04-15: 14:00:00 60 mg via SUBCUTANEOUS

## 2019-04-15 NOTE — Progress Notes (Signed)
Per orders of Np, Clarene Reamer injection of Prolia given by [facility]. Patient tolerated injection well.

## 2019-04-15 NOTE — Telephone Encounter (Signed)
Prolia administered today.  FYI to Charmaine.

## 2019-04-21 ENCOUNTER — Ambulatory Visit: Payer: Medicare HMO | Admitting: Internal Medicine

## 2019-04-21 ENCOUNTER — Other Ambulatory Visit: Payer: Self-pay

## 2019-04-21 MED ORDER — LEVOTHYROXINE SODIUM 50 MCG PO TABS: ORAL_TABLET | ORAL | 0 refills | Status: DC

## 2019-04-29 ENCOUNTER — Ambulatory Visit: Payer: Medicare HMO

## 2019-04-29 DIAGNOSIS — J45909 Unspecified asthma, uncomplicated: Secondary | ICD-10-CM | POA: Diagnosis not present

## 2019-05-04 ENCOUNTER — Encounter: Payer: Medicare HMO | Admitting: Family Medicine

## 2019-05-08 ENCOUNTER — Other Ambulatory Visit: Payer: Self-pay

## 2019-05-08 ENCOUNTER — Ambulatory Visit (INDEPENDENT_AMBULATORY_CARE_PROVIDER_SITE_OTHER): Payer: Medicare HMO | Admitting: Internal Medicine

## 2019-05-08 VITALS — BP 116/76 | HR 94 | Temp 96.6°F | Ht 66.5 in | Wt 142.8 lb

## 2019-05-08 DIAGNOSIS — E785 Hyperlipidemia, unspecified: Secondary | ICD-10-CM

## 2019-05-08 DIAGNOSIS — J309 Allergic rhinitis, unspecified: Secondary | ICD-10-CM | POA: Diagnosis not present

## 2019-05-08 DIAGNOSIS — E559 Vitamin D deficiency, unspecified: Secondary | ICD-10-CM | POA: Diagnosis not present

## 2019-05-08 DIAGNOSIS — M8580 Other specified disorders of bone density and structure, unspecified site: Secondary | ICD-10-CM

## 2019-05-08 DIAGNOSIS — R05 Cough: Secondary | ICD-10-CM | POA: Diagnosis not present

## 2019-05-08 DIAGNOSIS — E039 Hypothyroidism, unspecified: Secondary | ICD-10-CM

## 2019-05-08 DIAGNOSIS — J449 Chronic obstructive pulmonary disease, unspecified: Secondary | ICD-10-CM

## 2019-05-08 DIAGNOSIS — M25511 Pain in right shoulder: Secondary | ICD-10-CM | POA: Diagnosis not present

## 2019-05-08 DIAGNOSIS — J45909 Unspecified asthma, uncomplicated: Secondary | ICD-10-CM | POA: Diagnosis not present

## 2019-05-08 DIAGNOSIS — M810 Age-related osteoporosis without current pathological fracture: Secondary | ICD-10-CM

## 2019-05-08 DIAGNOSIS — J4550 Severe persistent asthma, uncomplicated: Secondary | ICD-10-CM

## 2019-05-08 DIAGNOSIS — R053 Chronic cough: Secondary | ICD-10-CM

## 2019-05-08 MED ORDER — LORATADINE 10 MG PO TABS: 10.0000 mg | ORAL_TABLET | Freq: Every day | ORAL | 3 refills | Status: DC | PRN

## 2019-05-08 MED ORDER — AZELASTINE HCL 0.15 % NA SOLN: 1.0000 | Freq: Two times a day (BID) | NASAL | 11 refills | Status: DC | PRN

## 2019-05-08 NOTE — Progress Notes (Signed)
No chief complaint on file.  F/u  1. Allergies and post nasal drip and cough mucinex helped and will take prn she also used to take claritin and takes flonase as well otc  2. Osteopenia/porosis noted on DEXA she has been on prolia since 2014 and T score is not improving will refer to endocrine agreeable  DEXA 04/14/19   AP Spine L3-L4 04/14/2019 81.3 Normal -0.2 1.189 g/cm2 5.4% Yes AP Spine L3-L4 12/01/2015 77.9 Normal -0.7 1.128 g/cm2 7.7% Yes AP Spine L3-L4 05/19/2013 75.4 Osteopenia -1.4 1.047 g/cm2 1.4% - AP Spine L3-L4 06/26/2011 73.5 Osteopenia -1.5 1.033 g/cm2 - -  Right Femur Neck 04/14/2019 81.3 Osteopenia -2.3 0.715 g/cm2 2.1% - Right Femur Neck 12/01/2015 77.9 Osteopenia -2.4 0.700 g/cm2 -3.7% - Right Femur Neck 05/19/2013 75.4 Osteopenia -2.2 0.727 g/cm2 8.0% Yes Right Femur Neck 06/26/2011 73.5 Osteoporosis -2.6 0.673 g/cm2 - -  Right Forearm Radius 33% 04/14/2019 81.3 Osteopenia -1.4 0.750 g/cm2 - -  ASSESSMENT: The BMD measured at Femur Neck is 0.715 g/cm2 with a T-score of -2.3.  This patient is considered OSTEOPENIC according to [facility]) criteria.  L-1 and L-2 were excluded due to degenerative changes.  The left femur was excluded due to surgical hardware.  Patient is not a candidate for FRAX due to Prolia.  Compared with prior study, there has been significant increase in the spine.  Compared with prior study, there has been no significant change in the total hip.  The scan quality is good. 3. Asthma/copd pfts deferred due to covid 19 but wants to resch pfts  4. Hypothyroidism on levo 50 mcg qd  5. C/o right shoulder 5/10 pain and left ankle pain est with Dr. Laymond Purser for ankle   Review of Systems  Constitutional: Positive for weight loss.       Down 4 lbs    HENT: Negative for hearing loss.        +PND  Eyes: Negative for blurred vision.  Respiratory: Positive for cough. Negative for shortness of breath.    Cardiovascular: Negative for chest pain.  Musculoskeletal: Positive for joint pain.  Skin: Negative for rash.  Psychiatric/Behavioral: Negative for memory loss.   Past Medical History:  Diagnosis Date  . Abdominal pain   . Abnormal CT of liver   . Anxiety   . Aortic stenosis   . Aortic valve disorders   . Arthritis   . Asthma   . Barrett's esophagus    [facility] last EGD 02/2018 Dr. Gustavo Lah   . Colon polyps   . COPD (chronic obstructive pulmonary disease) ([place])   . Depressive disorder   . Diverticulosis   . Fundic gland polyps of stomach, benign   . Gastric polyps   . Gastritis   . GERD (gastroesophageal reflux disease)   . Goiter   . Hiatal hernia    small   . Hypercholesteremia   . Hypothyroidism   . Impaired fasting glucose   . Intrinsic asthma, unspecified   . Left rib fracture   . Obstructive sleep apnea (adult) (pediatric)    used cpap x 10 years per Dr. Mortimer Fries off as of 12/30/18   . OSA (obstructive sleep apnea)    used cpap x 10 years per Dr. Mortimer Fries off as of 12/30/18   . Osteoporosis   . Pneumonia   . Pure hypercholesterolemia   . Schatzki's ring   . Syncope and collapse   . TIA (transient ischemic attack)   . Vitamin D  deficiency    Past Surgical History:  Procedure Laterality Date  . BREAST EXCISIONAL BIOPSY Left   . COLONOSCOPY    . COLONOSCOPY WITH ESOPHAGOGASTRODUODENOSCOPY (EGD)    . COLONOSCOPY WITH PROPOFOL N/A 12/14/2014   Procedure: COLONOSCOPY WITH PROPOFOL;  Surgeon: Lollie Sails, MD;  Location: [facility] ENDOSCOPY;  Service: Endoscopy;  Laterality: N/A;  . COLONOSCOPY WITH PROPOFOL N/A 03/18/2018   Procedure: COLONOSCOPY WITH PROPOFOL;  Surgeon: Lollie Sails, MD;  Location: [facility] ENDOSCOPY;  Service: Endoscopy;  Laterality: N/A;  . ENUCLEATION    . ESOPHAGOGASTRODUODENOSCOPY (EGD) WITH PROPOFOL N/A 12/14/2014   Procedure: ESOPHAGOGASTRODUODENOSCOPY (EGD) WITH PROPOFOL;  Surgeon: Lollie Sails, MD;  Location: [facility] ENDOSCOPY;  Service:  Endoscopy;  Laterality: N/A;  . ESOPHAGOGASTRODUODENOSCOPY (EGD) WITH PROPOFOL N/A 10/18/2016   Procedure: ESOPHAGOGASTRODUODENOSCOPY (EGD) WITH PROPOFOL;  Surgeon: Lollie Sails, MD;  Location[facility] ENDOSCOPY;  Service: Endoscopy;  Laterality: N/A;  . ESOPHAGOGASTRODUODENOSCOPY (EGD) WITH PROPOFOL N/A 03/18/2018   Procedure: ESOPHAGOGASTRODUODENOSCOPY (EGD) WITH PROPOFOL;  Surgeon: Lollie Sails, MD;  Location: [facility] ENDOSCOPY;  Service: Endoscopy;  Laterality: N/A;  . EYE SURGERY    . HIP SURGERY Left    pin and plate  . IR ANGIO INTRA EXTRACRAN SEL COM CAROTID INNOMINATE BILAT MOD SED  10/17/2018  . IR ANGIO VERTEBRAL SEL VERTEBRAL BILAT MOD SED  10/17/2018  . IR ANGIOGRAM EXTREMITY LEFT  10/17/2018  . Left Total Knee Arthroplasty     Dr Marry Guan and also had 39 years ago from 2020   . prosthesis eye implant    . REPLACEMENT UNICONDYLAR JOINT KNEE Left   . TONSILLECTOMY    . TOTAL KNEE REVISION Left 09/13/2017   Procedure: TOTAL KNEE REVISION;  Surgeon: Dereck Leep, MD;  Location: [facility] ORS;  Service: Orthopedics;  Laterality: Left;   Family History  Problem Relation Age of Onset  . Heart disease Father   . Alcohol abuse Father   . Breast cancer Mother   . Bipolar disorder Brother   . Dementia Sister        twin sister 43 dx'ed dementia   Social History   Socioeconomic History  . Marital status: Divorced    Spouse name: Not on file  . Number of children: Not on file  . Years of education: Not on file  . Highest education level: Not on file  Occupational History  . Not on file  Tobacco Use  . Smoking status: Never Smoker  . Smokeless tobacco: Never Used  Substance and Sexual Activity  . Alcohol use: Yes    Alcohol/week: 1.0 standard drinks    Types: 1 Glasses of wine per week    Comment: occas. wine  . Drug use: No  . Sexual activity: Never  Other Topics Concern  . Not on file  Social History Narrative   Has twin sister identical lives in [place] does not  live with her    Lived in [place], moved to [place] two years ago.   Divorced.      Has a living will.   Would desire CPR, would not desire extraordinary measures.      1 boy and 1 girl daughter [place], son In [place] hes architect   Social Determinants of Radio broadcast assistant Strain:   . Difficulty of Paying Living Expenses:   Food Insecurity:   . Worried About Charity fundraiser in the Last Year:   . [place] in the Last  Year:   Transportation Needs:   . Film/video editor (Medical):   Marland Kitchen Lack of Transportation (Non-Medical):   Physical Activity:   . Days of Exercise per Week:   . Minutes of Exercise per Session:   Stress:   . Feeling of Stress :   Social Connections:   . Frequency of Communication with Friends and Family:   . Frequency of Social Gatherings with Friends and Family:   . Attends Religious Services:   . Active Member of Clubs or Organizations:   . Attends Archivist Meetings:   Marland Kitchen Marital Status:   Intimate Partner Violence:   . Fear of Current or Ex-Partner:   . Emotionally Abused:   Marland Kitchen Physically Abused:   . Sexually Abused:    No outpatient medications have been marked as taking for the 05/08/19 encounter (Office Visit) with McLean-Scocuzza, Nino Glow, MD.   Allergies  Allergen Reactions  . Amoxicillin Other (See Comments)    Pencillins  . Cefzil [Cefprozil] Other (See Comments)    Unknown  . Cephalexin Other (See Comments)    Unknown  . Propoxyphene Other (See Comments)    Unknown  . Relafen [Nabumetone] Other (See Comments)    Unknown  . Talwin [Pentazocine] Other (See Comments)    Unknown  . Penicillins Other (See Comments)    Cannot remember reaction   No results found for this or any previous visit (from the past 2160 hour(s)). Objective  Body mass index is 22.7 kg/m. Wt Readings from Last 3 Encounters:  05/08/19 142 lb 12.8 oz (64.8 kg)  01/23/19 146 lb 9.6 oz (66.5 kg)  12/30/18 146 lb 9.6  oz (66.5 kg)   Temp Readings from Last 3 Encounters:  05/08/19 (!) 96.6 F (35.9 C)  01/23/19 97.8 F (36.6 C) (Temporal)  12/30/18 (!) 97.2 F (36.2 C) (Skin)   BP Readings from Last 3 Encounters:  05/08/19 116/76  01/23/19 130/78  12/30/18 112/70   Pulse Readings from Last 3 Encounters:  05/08/19 94  01/23/19 89  12/30/18 92    Physical Exam Vitals and nursing note reviewed.  Constitutional:      Appearance: Normal appearance. She is well-developed and well-groomed.  HENT:     Head: Normocephalic and atraumatic.  Eyes:     Conjunctiva/sclera: Conjunctivae normal.     Pupils: Pupils are equal, round, and reactive to light.  Cardiovascular:     Rate and Rhythm: Normal rate and regular rhythm.     Heart sounds: Normal heart sounds. No murmur.  Pulmonary:     Effort: Pulmonary effort is normal.     Breath sounds: Normal breath sounds.  Skin:    General: Skin is warm and dry.  Neurological:     General: No focal deficit present.     Mental Status: She is alert and oriented to person, place, and time. Mental status is at baseline.     Gait: Gait normal.  Psychiatric:        Attention and Perception: Attention and perception normal.        Mood and Affect: Mood and affect normal.        Speech: Speech normal.        Behavior: Behavior normal. Behavior is cooperative.        Thought Content: Thought content normal.        Cognition and Memory: Cognition and memory normal.        Judgment: Judgment normal.     Assessment  Plan  Allergic rhinitis, unspecified seasonality, unspecified trigger - Plan: loratadine (CLARITIN) 10 MG tablet, Azelastine HCl 0.15 % SOLN + flonase  If this does not help consider atroven   Asthma, chronic, severe persistent, uncomplicated Chronic obstructive pulmonary disease, unspecified COPD type (HCC) Persistent cough - Plan: CBC w/Diff Prn Mucinex  Check with Dr. Mortimer Fries if can resch pfts now? Were postponed 2/2 covid 19    Hyperlipidemia, unspecified hyperlipidemia type - Plan: Comprehensive metabolic panel, Lipid panel, CBC w/Diff  Vitamin D deficiency - Plan: Vitamin D (25 hydroxy)  Hypothyroidism, unspecified type - Plan: Comprehensive metabolic panel, CBC w/Diff, TSH Cont meds   Right shoulder pain, unspecified chronicity -will see if pt wants to do Xray   Osteoporosis, unspecified osteoporosis type, unspecified pathological fracture presence Osteopenia, unspecified location  -refer [facility] endocrine Dr. Honor Junes on prolia since 2014 and no change in DEXA still abnormal   Left ankle pain  -est with Dr. Laymond Purser if bothersome will have pt call his office    HM utd flu shot  prevnar utd  pna 25 had 12/19/18  shingrix had 2/2  covid vx 2/2 had   Consider Tdap in future td had 04/17/16   EGD/colonoscopy had 02/2018 [facility] with erosive gastritis, small HH, gastric polpys, schzatski ring, diverticulosis, 3 mm colon polyp and IH   04/14/19 negative mammogram  DEXA h/o osteoporosis/penia on prolia since 2014   T score -2.3 from -2.4 and still right femur with -2.6  Refer endocrine pt agreeable   Never smoker   Skin no current issues   Provider: Dr. Olivia Mackie McLean-Scocuzza-Internal Medicine

## 2019-05-08 NOTE — Patient Instructions (Addendum)
Nasal saline  claritin or allegra or zyrtec  Astepro nasal spray and if this doesn't work let me know will prescribe Atrovent    Refer to [facility] endocrine   Debrox ear wax drops x 4-7 days as needed 1x per month  Postnasal Drip Postnasal drip is the feeling of mucus going down the back of your throat. Mucus is a slimy substance that moistens and cleans your nose and throat, as well as the air pockets in face bones near your forehead and cheeks (sinuses). Small amounts of mucus pass from your nose and sinuses down the back of your throat all the time. This is normal. When you produce too much mucus or the mucus gets too thick, you can feel it. Some common causes of postnasal drip include:  Having more mucus because of: ? A cold or the flu. ? Allergies. ? Cold air. ? Certain medicines.  Having more mucus that is thicker because of: ? A sinus or nasal infection. ? Dry air. ? A food allergy. Follow these instructions at home: Relieving discomfort   Gargle with a salt-water mixture 3-4 times a day or as needed. To make a salt-water mixture, completely dissolve -1 tsp of salt in 1 cup of warm water.  If the air in your home is dry, use a humidifier to add moisture to the air.  Use a saline spray or container (neti pot) to flush out the nose (nasal irrigation). These methods can help clear away mucus and keep the nasal passages moist. General instructions  Take over-the-counter and prescription medicines only as told by your health care provider.  Follow instructions from your health care provider about eating or drinking restrictions. You may need to avoid caffeine.  Avoid things that you know you are allergic to (allergens), like dust, mold, pollen, pets, or certain foods.  Drink enough fluid to keep your urine pale yellow.  Keep all follow-up visits as told by your health care provider. This is important. Contact a health care provider if:  You have a fever.  You  have a sore throat.  You have difficulty swallowing.  You have headache.  You have sinus pain.  You have a cough that does not go away.  The mucus from your nose becomes thick and is green or yellow in color.  You have cold or flu symptoms that last more than 10 days. Summary  Postnasal drip is the feeling of mucus going down the back of your throat.  If your health care provider approves, use nasal irrigation or a nasal spray 2?4 times a day.  Avoid things that you know you are allergic to (allergens), like dust, mold, pollen, pets, or certain foods. This information is not intended to replace advice given to you by your health care provider. Make sure you discuss any questions you have with your health care provider. Document Revised: 05/30/2018 Document Reviewed: 05/21/2016 Elsevier Patient Education  [place].

## 2019-05-11 ENCOUNTER — Telehealth: Payer: Self-pay | Admitting: Internal Medicine

## 2019-05-11 ENCOUNTER — Encounter: Payer: Self-pay | Admitting: Internal Medicine

## 2019-05-11 DIAGNOSIS — M25511 Pain in right shoulder: Secondary | ICD-10-CM | POA: Insufficient documentation

## 2019-05-11 DIAGNOSIS — M8580 Other specified disorders of bone density and structure, unspecified site: Secondary | ICD-10-CM | POA: Insufficient documentation

## 2019-05-11 HISTORY — DX: Pain in right shoulder: M25.511

## 2019-05-11 HISTORY — DX: Other specified disorders of bone density and structure, unspecified site: M85.80

## 2019-05-11 NOTE — Telephone Encounter (Signed)
Left message to return call 

## 2019-05-11 NOTE — Telephone Encounter (Signed)
Pt returned your call. Please call on cell

## 2019-05-11 NOTE — Telephone Encounter (Signed)
Call pt  Right shoulder pain does she want me to order Xray ? Will order for [facility] OP imaging if she wants    Left ankle pain she is established with Dr. Laymond Purser if still bothering her call [facility] and schedule appt for f/u with him   [place]

## 2019-05-13 NOTE — Telephone Encounter (Signed)
err

## 2019-05-14 ENCOUNTER — Other Ambulatory Visit (INDEPENDENT_AMBULATORY_CARE_PROVIDER_SITE_OTHER): Payer: Medicare HMO

## 2019-05-14 ENCOUNTER — Other Ambulatory Visit: Payer: Self-pay

## 2019-05-14 DIAGNOSIS — E785 Hyperlipidemia, unspecified: Secondary | ICD-10-CM

## 2019-05-14 DIAGNOSIS — R053 Chronic cough: Secondary | ICD-10-CM

## 2019-05-14 DIAGNOSIS — E559 Vitamin D deficiency, unspecified: Secondary | ICD-10-CM | POA: Diagnosis not present

## 2019-05-14 DIAGNOSIS — R05 Cough: Secondary | ICD-10-CM | POA: Diagnosis not present

## 2019-05-14 DIAGNOSIS — J4520 Mild intermittent asthma, uncomplicated: Secondary | ICD-10-CM

## 2019-05-14 DIAGNOSIS — E039 Hypothyroidism, unspecified: Secondary | ICD-10-CM

## 2019-05-14 LAB — CBC WITH DIFFERENTIAL/PLATELET
Basophils Absolute: 0 10*3/uL (ref 0.0–0.1)
Basophils Relative: 0.7 % (ref 0.0–3.0)
Eosinophils Absolute: 0.3 10*3/uL (ref 0.0–0.7)
Eosinophils Relative: 6.2 % — ABNORMAL HIGH (ref 0.0–5.0)
HCT: 39.6 % (ref 36.0–46.0)
Hemoglobin: 13.7 g/dL (ref 12.0–15.0)
Lymphocytes Relative: 28.3 % (ref 12.0–46.0)
Lymphs Abs: 1.4 10*3/uL (ref 0.7–4.0)
MCHC: 34.7 g/dL (ref 30.0–36.0)
MCV: 89.5 fl (ref 78.0–100.0)
Monocytes Absolute: 0.5 10*3/uL (ref 0.1–1.0)
Monocytes Relative: 10.5 % (ref 3.0–12.0)
Neutro Abs: 2.6 10*3/uL (ref 1.4–7.7)
Neutrophils Relative %: 54.3 % (ref 43.0–77.0)
Platelets: 226 10*3/uL (ref 150.0–400.0)
RBC: 4.42 Mil/uL (ref 3.87–5.11)
RDW: 13.5 % (ref 11.5–15.5)
WBC: 4.8 10*3/uL (ref 4.0–10.5)

## 2019-05-14 LAB — COMPREHENSIVE METABOLIC PANEL
ALT: 10 U/L (ref 0–35)
AST: 15 U/L (ref 0–37)
Albumin: 3.9 g/dL (ref 3.5–5.2)
Alkaline Phosphatase: 44 U/L (ref 39–117)
BUN: 16 mg/dL (ref 6–23)
CO2: 30 mEq/L (ref 19–32)
Calcium: 9.1 mg/dL (ref 8.4–10.5)
Chloride: 105 mEq/L (ref 96–112)
Creatinine, Ser: 0.77 mg/dL (ref 0.40–1.20)
GFR: 71.87 mL/min (ref >60.00)
Glucose, Bld: 95 mg/dL (ref 70–99)
Potassium: 3.7 mEq/L (ref 3.5–5.1)
Sodium: 139 mEq/L (ref 135–145)
Total Bilirubin: 0.4 mg/dL (ref 0.2–1.2)
Total Protein: 6.2 g/dL (ref 6.0–8.3)

## 2019-05-14 LAB — TSH: TSH: 4.61 u[IU]/mL — ABNORMAL HIGH (ref 0.35–4.50)

## 2019-05-14 LAB — LIPID PANEL
Cholesterol: 150 mg/dL (ref 0–200)
HDL: 49.5 mg/dL (ref >39.00)
LDL Cholesterol: 75 mg/dL (ref 0–99)
NonHDL: 100.41
Total CHOL/HDL Ratio: 3
Triglycerides: 127 mg/dL (ref 0.0–149.0)
VLDL: 25.4 mg/dL (ref 0.0–40.0)

## 2019-05-14 LAB — VITAMIN D 25 HYDROXY (VIT D DEFICIENCY, FRACTURES): VITD: 40.27 ng/mL (ref 30.00–100.00)

## 2019-05-18 ENCOUNTER — Other Ambulatory Visit: Payer: Self-pay | Admitting: Internal Medicine

## 2019-05-18 DIAGNOSIS — E039 Hypothyroidism, unspecified: Secondary | ICD-10-CM

## 2019-05-19 DIAGNOSIS — E039 Hypothyroidism, unspecified: Secondary | ICD-10-CM | POA: Diagnosis not present

## 2019-05-19 DIAGNOSIS — E559 Vitamin D deficiency, unspecified: Secondary | ICD-10-CM | POA: Diagnosis not present

## 2019-05-19 DIAGNOSIS — M810 Age-related osteoporosis without current pathological fracture: Secondary | ICD-10-CM | POA: Diagnosis not present

## 2019-05-27 ENCOUNTER — Ambulatory Visit (INDEPENDENT_AMBULATORY_CARE_PROVIDER_SITE_OTHER): Payer: Medicare HMO

## 2019-05-27 VITALS — Ht 66.5 in | Wt 142.0 lb

## 2019-05-27 DIAGNOSIS — Z0000 Encounter for general adult medical examination without abnormal findings: Secondary | ICD-10-CM | POA: Diagnosis not present

## 2019-05-27 NOTE — Patient Instructions (Addendum)
  Ms. Herkert , Thank you for taking time to come for your Medicare Wellness Visit. I appreciate your ongoing commitment to your health goals. Please review the following plan we discussed and let me know if I can assist you in the future.   These are the goals we discussed: Goals    . Healthy Lifestyle     Starting 04/28/2018, I will continue to garden as weather permits, sleep at least 6-8 hours daily, and attempt to drink at least 3-4 glasses of water daily.        This is a list of the screening recommended for you and due dates:  Health Maintenance  Topic Date Due  . Flu Shot  09/20/2019  . Tetanus Vaccine  04/17/2026  . DEXA scan (bone density measurement)  Completed  . Pneumonia vaccines  Completed

## 2019-05-27 NOTE — Progress Notes (Signed)
Subjective:   Dorothy Church is a 84 y.o. female who presents for Medicare Annual (Subsequent) preventive examination.  Review of Systems:  No ROS.  Medicare Wellness Virtual Visit.  Visual/audio telehealth visit, UTA vital signs.   Ht/Wt provided. See social history for additional risk factors.   Cardiac Risk Factors include: advanced age (>33men, >95 women)     Objective:     Vitals: Ht 5' 6.5" (1.689 m)   Wt 142 lb (64.4 kg)   BMI 22.58 kg/m   Body mass index is 22.58 kg/m.  Advanced Directives 05/27/2019 10/17/2018 04/28/2018 03/18/2018 09/13/2017 09/13/2017 08/30/2017  Does Patient Have a Medical Advance Directive? Yes Yes Yes Yes Yes Yes Yes  Type of Paramedic of [place];Living will [place];Living will [place];Living will [place];Living will [place];Living will [place];Living will [place];Living will  Does patient want to make changes to medical advance directive? No - Patient declined No - Patient declined - - No - Patient declined No - Patient declined No - Patient declined  Copy of [place] in Chart? No - copy requested No - copy requested No - copy requested Yes - validated most recent copy scanned in chart (See row information) No - copy requested No - copy requested No - copy requested  Would patient like information on creating a medical advance directive? - - - - - - -    Tobacco Social History   Tobacco Use  Smoking Status Never Smoker  Smokeless Tobacco Never Used     Counseling given: Not Answered   Clinical Intake:  Pre-visit preparation completed: Yes        Diabetes: No  How often do you need to have someone help you when you read instructions, pamphlets, or other written materials from your doctor or pharmacy?: 1 - Never  Interpreter Needed?: No     Past Medical History:    Diagnosis Date  . Abdominal pain   . Abnormal CT of liver   . Anxiety   . Aortic stenosis   . Aortic valve disorders   . Arthritis   . Asthma   . Barrett's esophagus    [facility] last EGD 02/2018 Dr. Gustavo Lah   . Colon polyps   . COPD (chronic obstructive pulmonary disease) ([place])   . Depressive disorder   . Diverticulosis   . Fundic gland polyps of stomach, benign   . Gastric polyps   . Gastritis   . GERD (gastroesophageal reflux disease)   . Goiter   . Hiatal hernia    small   . Hypercholesteremia   . Hypothyroidism   . Impaired fasting glucose   . Intrinsic asthma, unspecified   . Left rib fracture   . Obstructive sleep apnea (adult) (pediatric)    used cpap x 10 years per Dr. Mortimer Fries off as of 12/30/18   . OSA (obstructive sleep apnea)    used cpap x 10 years per Dr. Mortimer Fries off as of 12/30/18   . Osteoporosis   . Pneumonia   . Pure hypercholesterolemia   . Schatzki's ring   . Syncope and collapse   . TIA (transient ischemic attack)   . Vitamin D deficiency    Past Surgical History:  Procedure Laterality Date  . BREAST EXCISIONAL BIOPSY Left   . COLONOSCOPY    . COLONOSCOPY WITH ESOPHAGOGASTRODUODENOSCOPY (EGD)    . COLONOSCOPY WITH PROPOFOL  N/A 12/14/2014   Procedure: COLONOSCOPY WITH PROPOFOL;  Surgeon: Lollie Sails, MD;  Location[facility] END[facility]COPY;  Service: Endoscopy;  Laterality: N/A;  . COLONOSCOPY WITH PROPOFOL N/A 03/18/2018   Procedure: COLONOSCOPY WITH PROPOFOL;  Surgeon: Lollie Sails, MD;  Location: [facility] ENDOSCOPY;  Service: Endoscopy;  Laterality: N/A;  . ENUCLEATION    . ESOPHAGOGASTRODUODENOSCOPY (EGD) WITH PROPOFOL N/A 12/14/2014   Procedure: ESOPHAGOGASTRODUODENOSCOPY (EGD) WITH PROPOFOL;  Surgeon: Lollie Sails, MD;  Location: [facility] ENDOSCOPY;  Service: Endoscopy;  Laterality: N/A;  . ESOPHAGOGASTRODUODENOSCOPY (EGD) WITH PROPOFOL N/A 10/18/2016   Procedure: ESOPHAGOGASTRODUODENOSCOPY (EGD) WITH PROPOFOL;  Surgeon: Lollie Sails, MD;   Location: [facility] ENDOSCOPY;  Service: Endoscopy;  Laterality: N/A;  . ESOPHAGOGASTRODUODENOSCOPY (EGD) WITH PROPOFOL N/A 03/18/2018   Procedure: ESOPHAGOGASTRODUODENOSCOPY (EGD) WITH PROPOFOL;  Surgeon: Lollie Sails, MD;  Location: [facility] ENDOSCOPY;  Service: Endoscopy;  Laterality: N/A;  . EYE SURGERY    . HIP SURGERY Left    pin and plate  . IR ANGIO INTRA EXTRACRAN SEL COM CAROTID INNOMINATE BILAT MOD SED  10/17/2018  . IR ANGIO VERTEBRAL SEL VERTEBRAL BILAT MOD SED  10/17/2018  . IR ANGIOGRAM EXTREMITY LEFT  10/17/2018  . Left Total Knee Arthroplasty     Dr Marry Guan and also had 39 years ago from 2020   . prosthesis eye implant    . REPLACEMENT UNICONDYLAR JOINT KNEE Left   . TONSILLECTOMY    . TOTAL KNEE REVISION Left 09/13/2017   Procedure: TOTAL KNEE REVISION;  Surgeon: Dereck Leep, MD;  Location: [facility] ORS;  Service: Orthopedics;  Laterality: Left;   Family History  Problem Relation Age of Onset  . Heart disease Father   . Alcohol abuse Father   . Breast cancer Mother   . Bipolar disorder Brother   . Dementia Sister        twin sister 23 dx'ed dementia   Social History   Socioeconomic History  . Marital status: Divorced    Spouse name: Not on file  . Number of children: Not on file  . Years of education: Not on file  . Highest education level: Not on file  Occupational History  . Not on file  Tobacco Use  . Smoking status: Never Smoker  . Smokeless tobacco: Never Used  Substance and Sexual Activity  . Alcohol use: Yes    Alcohol/week: 1.0 standard drinks    Types: 1 Glasses of wine per week    Comment: occas. wine  . Drug use: No  . Sexual activity: Never  Other Topics Concern  . Not on file  Social History Narrative   Has twin sister identical lives in [place] does not live with her    Lived in [place], moved to [place] two years ago.   Divorced.      Has a living will.   Would desire CPR, would not desire extraordinary measures.      1  boy and 1 girl daughter Ewa Gentry, son In [place] hes architect   Social Determinants of Radio broadcast assistant Strain:   . Difficulty of Paying Living Expenses:   Food Insecurity:   . Worried About Charity fundraiser in the Last Year:   . Arboriculturist in the Last Year:   Transportation Needs:   . Film/video editor (Medical):   Marland Kitchen Lack of Transportation (Non-Medical):   Physical Activity:   . Days of Exercise per Week:   . Minutes of  Exercise per Session:   Stress:   . Feeling of Stress :   Social Connections:   . Frequency of Communication with Friends and Family:   . Frequency of Social Gatherings with Friends and Family:   . Attends Religious Services:   . Active Member of Clubs or Organizations:   . Attends Archivist Meetings:   Marland Kitchen Marital Status:     Outpatient Encounter Medications as of 05/27/2019  Medication Sig  . acetaminophen (TYLENOL) 500 MG tablet Take 500 mg by mouth every 6 (six) hours as needed (PAIN).   Marland Kitchen albuterol (VENTOLIN HFA) 108 (90 BASE) MCG/ACT inhaler Inhale 2 puffs into the lungs every 6 (six) hours as needed for wheezing.  Marland Kitchen aspirin 325 MG tablet Take 325 mg by mouth daily.   . Azelastine HCl 0.15 % SOLN Place 1 spray into the nose 2 (two) times daily as needed.  . budesonide-formoterol (SYMBICORT) 160-4.5 MCG/ACT inhaler Inhale 2 puffs into the lungs 2 (two) times daily.  . calcium carbonate (TUMS - DOSED IN MG ELEMENTAL CALCIUM) 500 MG chewable tablet Chew 1 tablet by mouth 4 (four) times daily as needed for indigestion or heartburn.  . diclofenac sodium (VOLTAREN) 1 % GEL Apply 4 g topically 4 (four) times daily. (Patient taking differently: Apply 4 g topically 4 (four) times daily as needed (for pain). )  . diphenhydramine-acetaminophen (TYLENOL PM) 25-500 MG TABS tablet Take 1 tablet by mouth at bedtime as needed (for sleep).  . docusate sodium (COLACE) 50 MG capsule Take 50 mg by mouth daily.  . famotidine (PEPCID) 20 MG  tablet Take 20 mg by mouth daily.  . fluticasone (FLONASE) 50 MCG/ACT nasal spray Place into both nostrils daily.  Marland Kitchen ipratropium-albuterol (DUONEB) 0.5-2.5 (3) MG/3ML SOLN Take 3 mLs by nebulization every 6 (six) hours as needed.  Marland Kitchen levothyroxine (SYNTHROID) 50 MCG tablet TAKE 1 TABLET BY MOUTH EVERY DAY BEFORE BREAKFAST  . loratadine (CLARITIN) 10 MG tablet Take 1 tablet (10 mg total) by mouth daily as needed for allergies.  . mometasone (NASONEX) 50 MCG/ACT nasal spray Place 2 sprays into the nose daily as needed (ALLERGIES).   Marland Kitchen PARoxetine (PAXIL) 30 MG tablet Take 1 tablet (30 mg total) by mouth every morning.  . Polyvinyl Alcohol-Povidone PF (REFRESH) 1.4-0.6 % SOLN Place 2 drops into both eyes 2 (two) times daily as needed (for dry eyes).  . RABEprazole (ACIPHEX) 20 MG tablet Take 20 mg by mouth daily.  . simvastatin (ZOCOR) 20 MG tablet Take 1 tablet (20 mg total) by mouth at bedtime.  . traZODone (DESYREL) 50 MG tablet Take 0.5-1 tablets (25-50 mg total) by mouth at bedtime as needed for sleep.  . clindamycin (CLEOCIN) 150 MG capsule Take 600 mg by mouth See admin instructions. DENTAL PROCEDURES  . denosumab (PROLIA) 60 MG/ML SOSY injection Inject 60 mg into the skin every 6 (six) months.   Marland Kitchen ofloxacin (FLOXIN) 0.3 % OTIC solution   . triamcinolone cream (KENALOG) 0.1 %    No facility-administered encounter medications on file as of 05/27/2019.    Activities of Daily Living In your present state of health, do you have any difficulty performing the following activities: 05/27/2019  Hearing? N  Vision? Y  Comment L eye blindness  Difficulty concentrating or making decisions? N  Walking or climbing stairs? Y  Comment L knee x2 replacement. Leg weakness.  Dressing or bathing? N  Doing errands, shopping? N  Preparing Food and eating ? N  Using the  Toilet? N  In the past six months, have you accidently leaked urine? Y  Comment Stress incontinence; managed with daily liner  Do you have  problems with loss of bowel control? N  Managing your Medications? N  Managing your Finances? N  Housekeeping or managing your Housekeeping? N  Comment Maid assist once monthly  Some recent data might be hidden    Patient Care Team: McLean-Scocuzza, Nino Glow, MD as PCP - General (Internal Medicine) Vilinda Boehringer, MD (Inactive) as Consulting Physician (Internal Medicine) Elby Beck, FNP (Nurse Practitioner)    Assessment:   This is a routine wellness examination for [place].Nurse connected with patient 05/27/19 at 11:00 AM EDT by a telephone enabled telemedicine application and verified that I am speaking with the correct person using two identifiers. Patient stated full name and DOB. Patient gave permission to continue with virtual visit. Patient's location was at home and Nurse's location was at [place] office.   Patient is alert and oriented x3. Patient denies difficulty focusing or concentrating. Patient likes to use computer, ipad, and quilts for brain health.   Health Maintenance Due: See completed HM at the end of note.   Eye: Visual acuity not assessed. Virtual visit. Followed by their ophthalmologist every 6 months. Left eye blindness.   Dental: Visits every 6 months.    Hearing: Demonstrates normal hearing during visit.  Safety:  Patient feels safe at home- yes Patient does have smoke detectors at home- yes Patient does wear sunscreen or protective clothing when in direct sunlight - yes Patient does wear seat belt when in a moving vehicle - yes Patient drives- yes Adequate lighting in walkways free from debris- yes Grab bars and handrails used as appropriate- yes Ambulates with an assistive device- no Cell phone on person when ambulating outside of the home- yes  Social: Alcohol intake -yes      Smoking history- never   Smokers in home? none Illicit drug use? none  Medication: No longer taking prolia; states she is starting reclast in November.    Pill box in use -yes  Self managed - yes   Covid-19: Precautions and sickness symptoms discussed. Wears mask, social distancing, hand hygiene as appropriate.   Activities of Daily Living Patient denies needing assistance with: household chores, feeding themselves, getting from bed to chair, getting to the toilet, bathing/showering, dressing, managing money, or preparing meals.  Maid assist once monthly.   Discussed the importance of a healthy diet, water intake and the benefits of aerobic exercise.   Physical activity- active around the home, no routine. Leg strengthening exercises, 10 minutes.  Diet:  Regular Water: fair intake Caffeine: 1 cup of coffee  Other Providers Patient Care Team: McLean-Scocuzza, Nino Glow, MD as PCP - General (Internal Medicine) Vilinda Boehringer, MD (Inactive) as Consulting Physician (Internal Medicine) Elby Beck, FNP (Nurse Practitioner)  Exercise Activities and Dietary recommendations Current Exercise Habits: Home exercise routine, Time (Minutes): 10, Frequency (Times/Week): 7, Weekly Exercise (Minutes/Week): 70, Intensity: Mild  Goals    . Healthy Lifestyle     Starting 04/28/2018, I will continue to garden as weather permits, sleep at least 6-8 hours daily, and attempt to drink at least 3-4 glasses of water daily.        Fall Risk Fall Risk  05/27/2019 04/28/2018 06/26/2017 05/01/2017 03/21/2017  Falls in the past year? 0 0 No No Yes  Comment - - - - -  Number falls in past yr: - - - - 1  Comment - - - -  tripped on a rug 02-28-2017 "several fractures . Has boot on. [facility] in [place].  Injury with Fall? - - - - Yes  Comment - - - - fractures of right foot  Risk for fall due to : - - - - Impaired mobility  Risk for fall due to: Comment - - - - tripped over rug  Follow up Falls evaluation completed - - - Education provided   Timed Get Up and Go performed: no, virtual  Depression Screen PHQ 2/9 Scores 05/27/2019 04/28/2018 06/26/2017  05/01/2017  PHQ - 2 Score 1 0 0 0  PHQ- 9 Score - 0 - -     Cognitive Function MMSE - Mini Mental State Exam 04/28/2018 10/24/2016  Orientation to time 5 5  Orientation to Place 5 5  Registration 3 3  Attention/ Calculation 0 0  Recall 3 3  Language- name 2 objects 0 0  Language- repeat 1 1  Language- follow 3 step command 3 3  Language- read & follow direction 0 0  Write a sentence 0 0  Copy design 0 0  Total score 20 20     6CIT Screen 05/27/2019  What Year? 0 points  What month? 0 points  What time? 0 points  Count back from 20 0 points  Months in reverse 0 points  Repeat phrase 0 points  Total Score 0    Immunization History  Administered Date(s) Administered  . Influenza Whole 12/14/2010  . Influenza, High Dose Seasonal PF 12/02/2017, 12/08/2018  . Influenza,inj,Quad PF,6+ Mos 11/10/2012, 11/05/2013, 11/10/2014, 10/17/2015, 10/24/2016  . Influenza-Unspecified 12/04/2011, 11/10/2012, 11/05/2013, 12/07/2018  . Moderna SARS-COVID-2 Vaccination 03/03/2019, 03/31/2019  . Pneumococcal Conjugate-13 10/07/2009, 04/23/2013, 11/24/2013  . Pneumococcal Polysaccharide-23 11/13/2009, 12/19/2018  . Td 04/17/2016  . Zoster 05/08/2007  . Zoster Recombinat (Shingrix) 04/09/2018, 09/15/2018   Screening Tests Health Maintenance  Topic Date Due  . INFLUENZA VACCINE  09/20/2019  . TETANUS/TDAP  04/17/2026  . DEXA SCAN  Completed  . PNA vac Low Risk Adult  Completed      Plan:    Keep all routine maintenance appointments.  Next scheduled lab 07/22/19 @ 10:00 Follow up 09/15/19 @ 11:00   Medicare Attestation I have personally reviewed: The patient's medical and social history Their use of alcohol, tobacco or illicit drugs Their current medications and supplements The patient's functional ability including ADLs,fall risks, home safety risks, cognitive, and hearing and visual impairment Diet and physical activities Evidence for depression   I have reviewed and discussed with  patient certain preventive protocols, quality metrics, and best practice recommendations.     Varney Biles, LPN  [id number]

## 2019-05-30 DIAGNOSIS — J45909 Unspecified asthma, uncomplicated: Secondary | ICD-10-CM | POA: Diagnosis not present

## 2019-06-01 NOTE — Telephone Encounter (Signed)
Spoke with the patient and she states these pains come and go but are no longer bothering her. She declines any imaging. Informed the patient that she she change her mind or the pain returns to call back in. Patient verbalized understanding

## 2019-06-02 ENCOUNTER — Ambulatory Visit: Payer: Medicare HMO | Admitting: Podiatry

## 2019-06-02 ENCOUNTER — Other Ambulatory Visit: Payer: Self-pay

## 2019-06-02 ENCOUNTER — Ambulatory Visit (INDEPENDENT_AMBULATORY_CARE_PROVIDER_SITE_OTHER): Payer: Medicare HMO

## 2019-06-02 DIAGNOSIS — M25572 Pain in left ankle and joints of left foot: Secondary | ICD-10-CM | POA: Diagnosis not present

## 2019-06-02 DIAGNOSIS — M659 Synovitis and tenosynovitis, unspecified: Secondary | ICD-10-CM | POA: Diagnosis not present

## 2019-06-02 DIAGNOSIS — M65172 Other infective (teno)synovitis, left ankle and foot: Secondary | ICD-10-CM

## 2019-06-02 DIAGNOSIS — M79672 Pain in left foot: Secondary | ICD-10-CM | POA: Diagnosis not present

## 2019-06-04 ENCOUNTER — Other Ambulatory Visit: Payer: Self-pay | Admitting: Podiatry

## 2019-06-04 DIAGNOSIS — M659 Synovitis and tenosynovitis, unspecified: Secondary | ICD-10-CM

## 2019-06-08 NOTE — Progress Notes (Signed)
   Subjective:  84 y.o. female presenting today with a chief complaint of dull aching left foot and ankle pain that began 6 months ago. She states the pain is worse in the morning when she first gets up. Walking increases the pain. She has been wrapping the area in an ace bandage, taking Ibuprofen and Tylenol and using an orthotic in the left shoe. Patient is here for further evaluation and treatment.   Past Medical History:  Diagnosis Date  . Abdominal pain   . Abnormal CT of liver   . Anxiety   . Aortic stenosis   . Aortic valve disorders   . Arthritis   . Asthma   . Barrett's esophagus    [facility] last EGD 02/2018 Dr. Gustavo Lah   . Colon polyps   . COPD (chronic obstructive pulmonary disease) ([place])   . Depressive disorder   . Diverticulosis   . Fundic gland polyps of stomach, benign   . Gastric polyps   . Gastritis   . GERD (gastroesophageal reflux disease)   . Goiter   . Hiatal hernia    small   . Hypercholesteremia   . Hypothyroidism   . Impaired fasting glucose   . Intrinsic asthma, unspecified   . Left rib fracture   . Obstructive sleep apnea (adult) (pediatric)    used cpap x 10 years per Dr. Mortimer Fries off as of 12/30/18   . OSA (obstructive sleep apnea)    used cpap x 10 years per Dr. Mortimer Fries off as of 12/30/18   . Osteoporosis   . Pneumonia   . Pure hypercholesterolemia   . Schatzki's ring   . Syncope and collapse   . TIA (transient ischemic attack)   . Vitamin D deficiency      Objective / Physical Exam:  General:  The patient is alert and oriented x3 in no acute distress. Dermatology:  Skin is warm, dry and supple bilateral lower extremities. Negative for open lesions or macerations. Vascular:  Palpable pedal pulses bilaterally. No edema or erythema noted. Capillary refill within normal limits. Neurological:  Epicritic and protective threshold grossly intact bilaterally.  Musculoskeletal Exam:  Pain on palpation to the anterior lateral medial aspects of the  patient's left ankle. Mild edema noted. Range of motion within normal limits to all pedal and ankle joints bilateral. Muscle strength 5/5 in all groups bilateral.   Radiographic Exam:  Normal osseous mineralization. Joint spaces preserved. No fracture/dislocation/boney destruction.    Assessment: 1. Ankle synovitis left   Plan of Care:  1. Patient was evaluated. X-Rays reviewed.  2. Injection of 0.5 mL Celestone Soluspan injected in the patient's left ankle. 3. Compression anklet dispensed.  4. Recommended good shoe gear.  5. Return to clinic as needed.    Edrick Kins, DPM [facility]  Dr. Edrick Kins, [address]                                        [place], [place] [address]                Office [phone]  Fax [phone]

## 2019-06-19 ENCOUNTER — Encounter (INDEPENDENT_AMBULATORY_CARE_PROVIDER_SITE_OTHER): Payer: Medicare HMO | Admitting: Internal Medicine

## 2019-06-19 ENCOUNTER — Other Ambulatory Visit: Payer: Self-pay

## 2019-06-19 ENCOUNTER — Other Ambulatory Visit (INDEPENDENT_AMBULATORY_CARE_PROVIDER_SITE_OTHER): Payer: Medicare HMO

## 2019-06-19 DIAGNOSIS — E039 Hypothyroidism, unspecified: Secondary | ICD-10-CM

## 2019-06-19 DIAGNOSIS — R399 Unspecified symptoms and signs involving the genitourinary system: Secondary | ICD-10-CM

## 2019-06-19 LAB — TSH: TSH: 4.2 u[IU]/mL (ref 0.35–4.50)

## 2019-06-19 MED ORDER — CIPROFLOXACIN HCL 500 MG PO TABS: 500.0000 mg | ORAL_TABLET | Freq: Two times a day (BID) | ORAL | 0 refills | Status: DC

## 2019-06-19 NOTE — Telephone Encounter (Signed)
The patient would like something sent in to [facility] for over the weekend as the results are not back yet. Please advise    Routing comment   Thressa Sheller, CMA 7 hours ago (9:59 AM)     Pt called into the office as she had questions. Informed the patient that since this would be a new issue being worked up outside of a visit, there would need to be a consultation fee. Patient is agreeable to this and gave me verbal consent.   Scheduled the patient to come complete a urine sample in office today 4/30 at 11:30 am.   Okay per Dr Olivia Mackie to enter orders. Done.       Documentation   [name], [name]  You 8 hours ago (9:28 AM)   What is the charge?l   You  [name], [name] 9 hours ago (8:04 AM)  TM If you are agreeable to my chart consult fee we can address this via my chart and bill through insurance  Let me know asap   Elpidio Galea T, CMA routed conversation to You 9 hours ago (8:01 AM)  Dorothy Church H  You 9 hours ago (7:47 AM)   Dr. Olivia Mackie.   I believe I may have a UTI.   If I bring a urine sample by the office, can you check it?  Thank you.   Dorothy Church.    A/P  UTI  UA/culture  Cipro 500 mg bid x 3-5 days with food   Agreeable to fee  Time 5-10 min

## 2019-06-19 NOTE — Addendum Note (Signed)
Addended by: Orland Mustard on: 06/19/2019 05:46 PM   Modules accepted: Orders

## 2019-06-19 NOTE — Telephone Encounter (Signed)
Pt called into the office as she had questions. Informed the patient that since this would be a new issue being worked up outside of a visit, there would need to be a consultation fee. Patient is agreeable to this and gave me verbal consent.   Scheduled the patient to come complete a urine sample in office today 4/30 at 11:30 am.   Okay per Dr Olivia Mackie to enter orders. Done.

## 2019-06-20 LAB — URINALYSIS
Bilirubin Urine: NEGATIVE
Glucose, UA: NEGATIVE
Hgb urine dipstick: NEGATIVE
Ketones, ur: NEGATIVE
Leukocytes,Ua: NEGATIVE
Nitrite: NEGATIVE
Protein, ur: NEGATIVE
Specific Gravity, Urine: 1.021 (ref 1.001–1.03)
pH: 7.5 (ref 5.0–8.0)

## 2019-06-20 LAB — URINE CULTURE
MICRO NUMBER:: 10425770
Result:: NO GROWTH
SPECIMEN QUALITY:: ADEQUATE

## 2019-06-29 DIAGNOSIS — J45909 Unspecified asthma, uncomplicated: Secondary | ICD-10-CM | POA: Diagnosis not present

## 2019-07-07 ENCOUNTER — Other Ambulatory Visit: Payer: Self-pay

## 2019-07-07 ENCOUNTER — Telehealth: Payer: Self-pay | Admitting: Internal Medicine

## 2019-07-07 ENCOUNTER — Encounter: Payer: Self-pay | Admitting: Internal Medicine

## 2019-07-07 ENCOUNTER — Encounter: Payer: Self-pay | Admitting: Nurse Practitioner

## 2019-07-07 ENCOUNTER — Ambulatory Visit: Payer: Medicare HMO | Admitting: Nurse Practitioner

## 2019-07-07 VITALS — BP 110/80 | HR 88 | Temp 96.5°F | Ht 66.0 in | Wt 142.0 lb

## 2019-07-07 DIAGNOSIS — L0390 Cellulitis, unspecified: Secondary | ICD-10-CM | POA: Diagnosis not present

## 2019-07-07 DIAGNOSIS — D179 Benign lipomatous neoplasm, unspecified: Secondary | ICD-10-CM

## 2019-07-07 MED ORDER — DOXYCYCLINE HYCLATE 100 MG PO TABS: 100.0000 mg | ORAL_TABLET | Freq: Two times a day (BID) | ORAL | 0 refills | Status: DC

## 2019-07-07 NOTE — Telephone Encounter (Signed)
Spoke with the Patient and informed her that we have on record, along with a form she signed, on 12/30/2018 that she was given the nebulizer machine. Went over what these machines can look like and how they work.   Patient verbalized understanding and states she no longer uses this and does not know where this machine could be. States she now gets treatment through her pulmonologist. Also states that Dr Olivia Mackie had wanted her to get a breathing test but these were not being done at the time due to COVID-19.   Patient states that the company just needs something from our office stating that she no longer uses this machine. They are not asking for the machine back or for [place] to say she did not get the machine. She states that her Pulmonology doctor has her doing something different.   Please advise

## 2019-07-07 NOTE — Progress Notes (Signed)
Careteam: Patient Care Team: McLean-Scocuzza, Nino Glow, MD as PCP - General (Internal Medicine) Vilinda Boehringer, MD (Inactive) as Consulting Physician (Internal Medicine) Elby Beck, FNP (Nurse Practitioner)  Advanced Directive information    Allergies  Allergen Reactions  . Amoxicillin Other (See Comments)    Pencillins  . Cefzil [Cefprozil] Other (See Comments)    Unknown  . Cephalexin Other (See Comments)    Unknown  . Propoxyphene Other (See Comments)    Unknown  . Relafen [Nabumetone] Other (See Comments)    Unknown  . Talwin [Pentazocine] Other (See Comments)    Unknown  . Penicillins Other (See Comments)    Cannot remember reaction    Chief Complaint  Patient presents with  . Acute Visit    Cyst on bottom     HPI: Patient is a 84 y.o. female seen in today at the [facility] for cyst on her bottom.  Reports she first noticed area 2 year ago by nurse in healthcare after joint replacement. Stayed small and did not bother her until 2 week ago and then now area has become very red and painful. 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  Her sister told her someone needed to look at it.    Review of Systems:  Review of Systems  Constitutional: Negative.  Negative for chills and fever.  Skin: Negative for itching and rash.       Tender area on left buttock with redness     Past Medical History:  Diagnosis Date  . Abdominal pain   . Abnormal CT of liver   . Anxiety   . Aortic stenosis   . Aortic valve disorders   . Arthritis   . Asthma   . Barrett's esophagus    [facility] last EGD 02/2018 Dr. Gustavo Lah   . Colon polyps   . COPD (chronic obstructive pulmonary disease) ([place])   . Depressive disorder   . Diverticulosis   . Fundic gland polyps of stomach, benign   . Gastric polyps   . Gastritis   . GERD (gastroesophageal reflux disease)   . Goiter   . Hiatal hernia    small   . Hypercholesteremia   . Hypothyroidism   . Impaired  fasting glucose   . Intrinsic asthma, unspecified   . Left rib fracture   . Obstructive sleep apnea (adult) (pediatric)    used cpap x 10 years per Dr. Mortimer Fries off as of 12/30/18   . OSA (obstructive sleep apnea)    used cpap x 10 years per Dr. Mortimer Fries off as of 12/30/18   . Osteoporosis   . Pneumonia   . Pure hypercholesterolemia   . Schatzki's ring   . Syncope and collapse   . TIA (transient ischemic attack)   . Vitamin D deficiency    Past Surgical History:  Procedure Laterality Date  . BREAST EXCISIONAL BIOPSY Left   . COLONOSCOPY    . COLONOSCOPY WITH ESOPHAGOGASTRODUODENOSCOPY (EGD)    . COLONOSCOPY WITH PROPOFOL N/A 12/14/2014   Procedure: COLONOSCOPY WITH PROPOFOL;  Surgeon: Lollie Sails, MD;  Location: [facility] ENDOSCOPY;  Service: Endoscopy;  Laterality: N/A;  . COLONOSCOPY WITH PROPOFOL N/A 03/18/2018   Procedure: COLONOSCOPY WITH PROPOFOL;  Surgeon: Lollie Sails, MD;  Location: [facility] ENDOSCOPY;  Service: Endoscopy;  Laterality: N/A;  . ENUCLEATION    . ESOPHAGOGASTRODUODENOSCOPY (EGD) WITH PROPOFOL N/A 12/14/2014   Procedure: ESOPHAGOGASTRODUODENOSCOPY (EGD) WITH PROPOFOL;  Surgeon: Billie Ruddy  Gustavo Lah, MD;  Location: [facility] ENDOSCOPY;  Service: Endoscopy;  Laterality: N/A;  . ESOPHAGOGASTRODUODENOSCOPY (EGD) WITH PROPOFOL N/A 10/18/2016   Procedure: ESOPHAGOGASTRODUODENOSCOPY (EGD) WITH PROPOFOL;  Surgeon: Lollie Sails, MD;  Location: [facility] ENDOSCOPY;  Service: Endoscopy;  Laterality: N/A;  . ESOPHAGOGASTRODUODENOSCOPY (EGD) WITH PROPOFOL N/A 03/18/2018   Procedure: ESOPHAGOGASTRODUODENOSCOPY (EGD) WITH PROPOFOL;  Surgeon: Lollie Sails, MD;  Location: [facility] ENDOSCOPY;  Service: Endoscopy;  Laterality: N/A;  . EYE SURGERY    . HIP SURGERY Left    pin and plate  . IR ANGIO INTRA EXTRACRAN SEL COM CAROTID INNOMINATE BILAT MOD SED  10/17/2018  . IR ANGIO VERTEBRAL SEL VERTEBRAL BILAT MOD SED  10/17/2018  . IR ANGIOGRAM EXTREMITY LEFT  10/17/2018  . Left Total Knee  Arthroplasty     Dr Marry Guan and also had 39 years ago from 2020   . prosthesis eye implant    . REPLACEMENT UNICONDYLAR JOINT KNEE Left   . TONSILLECTOMY    . TOTAL KNEE REVISION Left 09/13/2017   Procedure: TOTAL KNEE REVISION;  Surgeon: Dereck Leep, MD;  Location: [facility] ORS;  Service: Orthopedics;  Laterality: Left;   Social History:   reports that she has never smoked. She has never used smokeless tobacco. She reports current alcohol use of about 1.0 standard drinks of alcohol per week. She reports that she does not use drugs.  Family History  Problem Relation Age of Onset  . Heart disease Father   . Alcohol abuse Father   . Breast cancer Mother   . Bipolar disorder Brother   . Dementia Sister        twin sister 47 dx'ed dementia    Medications: Patient's Medications  New Prescriptions   No medications on file  Previous Medications   ACETAMINOPHEN (TYLENOL) 500 MG TABLET    Take 500 mg by mouth every 6 (six) hours as needed (PAIN).    ALBUTEROL (VENTOLIN HFA) 108 (90 BASE) MCG/ACT INHALER    Inhale 2 puffs into the lungs every 6 (six) hours as needed for wheezing.   ASPIRIN 325 MG TABLET    Take 325 mg by mouth daily.    AZELASTINE HCL 0.15 % SOLN    Place 1 spray into the nose 2 (two) times daily as needed.   BUDESONIDE-FORMOTEROL (SYMBICORT) 160-4.5 MCG/ACT INHALER    Inhale 2 puffs into the lungs 2 (two) times daily.   CALCIUM CARBONATE (TUMS - DOSED IN MG ELEMENTAL CALCIUM) 500 MG CHEWABLE TABLET    Chew 1 tablet by mouth 4 (four) times daily as needed for indigestion or heartburn.   DICLOFENAC SODIUM (VOLTAREN) 1 % GEL    Apply 4 g topically 4 (four) times daily.   DOCUSATE SODIUM (COLACE) 50 MG CAPSULE    Take 50 mg by mouth daily.   FAMOTIDINE (PEPCID) 20 MG TABLET    Take 20 mg by mouth daily.   FLUTICASONE (FLONASE) 50 MCG/ACT NASAL SPRAY    Place into both nostrils daily.   LEVOTHYROXINE (SYNTHROID) 50 MCG TABLET    TAKE 1 TABLET BY MOUTH EVERY DAY BEFORE BREAKFAST     LORATADINE (CLARITIN) 10 MG TABLET    Take 1 tablet (10 mg total) by mouth daily as needed for allergies.   PAROXETINE (PAXIL) 30 MG TABLET    Take 1 tablet (30 mg total) by mouth every morning.   POLYVINYL ALCOHOL-POVIDONE PF (REFRESH) 1.4-0.6 % SOLN    Place 2 drops into both eyes 2 (two) times daily as  needed (for dry eyes).   SIMVASTATIN (ZOCOR) 20 MG TABLET    Take 1 tablet (20 mg total) by mouth at bedtime.  Modified Medications   No medications on file  Discontinued Medications   CIPROFLOXACIN (CIPRO) 500 MG TABLET    Take 1 tablet (500 mg total) by mouth 2 (two) times daily. X 3-5 days with food   CLINDAMYCIN (CLEOCIN) 150 MG CAPSULE    Take 600 mg by mouth See admin instructions. DENTAL PROCEDURES   DENOSUMAB (PROLIA) 60 MG/ML SOSY INJECTION    Inject 60 mg into the skin every 6 (six) months.    DIPHENHYDRAMINE-ACETAMINOPHEN (TYLENOL PM) 25-500 MG TABS TABLET    Take 1 tablet by mouth at bedtime as needed (for sleep).   IPRATROPIUM-ALBUTEROL (DUONEB) 0.5-2.5 (3) MG/3ML SOLN    Take 3 mLs by nebulization every 6 (six) hours as needed.   MOMETASONE (NASONEX) 50 MCG/ACT NASAL SPRAY    Place 2 sprays into the nose daily as needed (ALLERGIES).    OFLOXACIN (FLOXIN) 0.3 % OTIC SOLUTION       RABEPRAZOLE (ACIPHEX) 20 MG TABLET    Take 20 mg by mouth daily.   TRAZODONE (DESYREL) 50 MG TABLET    Take 0.5-1 tablets (25-50 mg total) by mouth at bedtime as needed for sleep.   TRIAMCINOLONE CREAM (KENALOG) 0.1 %        Physical Exam:  Vitals:   07/07/19 1031  BP: 110/80  Pulse: 88  Temp: (!) 96.5 F (35.8 C)  TempSrc: Temporal  SpO2: 97%  Weight: 142 lb (64.4 kg)  Height: 5\' 6"  (1.676 m)   Body mass index is 22.92 kg/m. Wt Readings from Last 3 Encounters:  07/07/19 142 lb (64.4 kg)  05/27/19 142 lb (64.4 kg)  05/08/19 142 lb 12.8 oz (64.8 kg)    Physical Exam Constitutional:      Appearance: Normal appearance.  HENT:     Head: Normocephalic and atraumatic.  Skin:     General: Skin is warm and dry.     Findings: Erythema present.     Comments: Quarter size soft mobile area to right buttock with surrounding erythema   Neurological:     Mental Status: She is alert.    Labs reviewed: Basic Metabolic Panel: Recent Labs    10/07/18 1547 10/17/18 0804 12/30/18 1101 05/14/19 1001 06/19/19 1129  NA  --  138  --  139  --   K  --  3.4*  --  3.7  --   CL  --  102  --  105  --   CO2  --  27  --  30  --   GLUCOSE  --  95  --  95  --   BUN  --  12  --  16  --   CREATININE 0.80 0.80  --  0.77  --   CALCIUM  --  8.9  --  9.1  --   TSH  --   --  2.40 4.61* 4.20   Liver Function Tests: Recent Labs    05/14/19 1001  AST 15  ALT 10  ALKPHOS 44  BILITOT 0.4  PROT 6.2  ALBUMIN 3.9   No results for input(s): LIPASE, AMYLASE in the last 8760 hours. No results for input(s): AMMONIA in the last 8760 hours. CBC: Recent Labs    10/17/18 0804 05/14/19 1001  WBC 5.1 4.8  NEUTROABS  --  2.6  HGB 14.0 13.7  HCT 42.0 39.6  MCV 90.9 89.5  PLT 211 226.0   Lipid Panel: Recent Labs    05/14/19 1001  CHOL 150  HDL 49.50  LDLCALC 75  TRIG 127.0  CHOLHDL 3   TSH: Recent Labs    12/30/18 1101 05/14/19 1001 06/19/19 1129  TSH 2.40 4.61* 4.20   A1C: Lab Results  Component Value Date   HGBA1C 5.3 12/12/2016     Assessment/Plan 1. Lipoma, unspecified site previously noted and has not been bothersome but now more tender and red, reports her sister did squeeze area to see if anything would drain but it did not -to use warm compress with doxycycline BID for 7 days - doxycycline (VIBRA-TABS) 100 MG tablet; Take 1 tablet (100 mg total) by mouth 2 (two) times daily.  Dispense: 14 tablet; Refill: 0  2. Cellulitis, unspecified cellulitis site Redness and tenderness around lipoma - doxycycline (VIBRA-TABS) 100 MG tablet; Take 1 tablet (100 mg total) by mouth 2 (two) times daily.  Dispense: 14 tablet; Refill: 0 -to seek medical attention for  worsening redness, tenderness or fever   Daphine Loch [place]. Poole, [facility] [phone]

## 2019-07-07 NOTE — Patient Instructions (Signed)
Take probiotic twice daily Start doxycyline 100 mg by mouth twice daily with food- complete 7 days   To use warm compress to area twice daily for 1 week then as needed  Follow up in 2 weeks if area has not completely improved (or if you want evaluated)   To follow up if area becomes increasingly red, tender or fever

## 2019-07-07 NOTE — Telephone Encounter (Signed)
Dorothy Church.  Thank you for your call.   I was able to locate the nebulizer.   Don't remember getting it or using it.   I did set it up and used it a time or two.  It seemed to not help with my breathing issues and is very loud to use.    I will return the machine if I am given information.   Thank you for your help.   Fax letter to [employer]  Give pt # to [employer] as well   Thank you  Dr. Kelly Services

## 2019-07-07 NOTE — Telephone Encounter (Signed)
Note from 12/30/2018: Persistent cough ddx uncontrolled copd/asthma vs other r/o malignancy vs other I.e ILD vs atypical infection less likely due to GI issues - Plan: CT Chest Wo Contrast, ipratropium-albuterol (DUONEB) 0.5-2.5 (3) MG/3ML SOLN Given neb machine today -f/u with Dr. Mortimer Fries 01/2019 will CC him to see if would consider pfts for Patient   Please advise, was Patient given a machine or just the order for the solution?

## 2019-07-08 NOTE — Telephone Encounter (Signed)
Faxed via [employer] to [employer] at [phone]. Will send this and [employer] number via mychart encounter to patient.

## 2019-07-09 ENCOUNTER — Ambulatory Visit: Payer: Medicare HMO | Admitting: Internal Medicine

## 2019-07-21 ENCOUNTER — Telehealth: Payer: Self-pay | Admitting: *Deleted

## 2019-07-21 ENCOUNTER — Encounter: Payer: Self-pay | Admitting: Internal Medicine

## 2019-07-21 ENCOUNTER — Other Ambulatory Visit: Payer: Self-pay | Admitting: Internal Medicine

## 2019-07-21 DIAGNOSIS — E039 Hypothyroidism, unspecified: Secondary | ICD-10-CM

## 2019-07-21 MED ORDER — SIMVASTATIN 20 MG PO TABS: 20.0000 mg | ORAL_TABLET | Freq: Every day | ORAL | 3 refills | Status: DC

## 2019-07-21 NOTE — Telephone Encounter (Signed)
Please place future orders for lab appt.  

## 2019-07-22 ENCOUNTER — Other Ambulatory Visit (INDEPENDENT_AMBULATORY_CARE_PROVIDER_SITE_OTHER): Payer: Medicare HMO

## 2019-07-22 ENCOUNTER — Other Ambulatory Visit: Payer: Self-pay

## 2019-07-22 DIAGNOSIS — E039 Hypothyroidism, unspecified: Secondary | ICD-10-CM | POA: Diagnosis not present

## 2019-07-22 LAB — TSH: TSH: 3.16 u[IU]/mL (ref 0.35–4.50)

## 2019-07-30 DIAGNOSIS — J45909 Unspecified asthma, uncomplicated: Secondary | ICD-10-CM | POA: Diagnosis not present

## 2019-08-07 ENCOUNTER — Other Ambulatory Visit: Payer: Self-pay | Admitting: Family Medicine

## 2019-08-07 NOTE — Telephone Encounter (Signed)
Will route to PCP 

## 2019-08-13 ENCOUNTER — Other Ambulatory Visit: Payer: Self-pay | Admitting: Internal Medicine

## 2019-08-13 MED ORDER — LEVOTHYROXINE SODIUM 50 MCG PO TABS: ORAL_TABLET | ORAL | 0 refills | Status: DC

## 2019-08-29 DIAGNOSIS — J45909 Unspecified asthma, uncomplicated: Secondary | ICD-10-CM | POA: Diagnosis not present

## 2019-09-03 DIAGNOSIS — R69 Illness, unspecified: Secondary | ICD-10-CM | POA: Diagnosis not present

## 2019-09-15 ENCOUNTER — Encounter: Payer: Self-pay | Admitting: Internal Medicine

## 2019-09-15 ENCOUNTER — Ambulatory Visit (INDEPENDENT_AMBULATORY_CARE_PROVIDER_SITE_OTHER): Payer: Medicare HMO | Admitting: Internal Medicine

## 2019-09-15 ENCOUNTER — Other Ambulatory Visit: Payer: Self-pay

## 2019-09-15 VITALS — BP 112/68 | HR 90 | Temp 98.1°F | Ht 66.0 in | Wt 136.4 lb

## 2019-09-15 DIAGNOSIS — E039 Hypothyroidism, unspecified: Secondary | ICD-10-CM

## 2019-09-15 DIAGNOSIS — G4700 Insomnia, unspecified: Secondary | ICD-10-CM | POA: Diagnosis not present

## 2019-09-15 DIAGNOSIS — M25519 Pain in unspecified shoulder: Secondary | ICD-10-CM | POA: Diagnosis not present

## 2019-09-15 DIAGNOSIS — M810 Age-related osteoporosis without current pathological fracture: Secondary | ICD-10-CM | POA: Diagnosis not present

## 2019-09-15 DIAGNOSIS — L309 Dermatitis, unspecified: Secondary | ICD-10-CM

## 2019-09-15 MED ORDER — LEVOTHYROXINE SODIUM 50 MCG PO TABS: ORAL_TABLET | ORAL | 3 refills | Status: DC

## 2019-09-15 MED ORDER — HYDROCORTISONE 2.5 % EX CREA: TOPICAL_CREAM | Freq: Two times a day (BID) | CUTANEOUS | 11 refills | Status: DC

## 2019-09-15 MED ORDER — TRAZODONE HCL 50 MG PO TABS: 25.0000 mg | ORAL_TABLET | Freq: Every evening | ORAL | 11 refills | Status: DC | PRN

## 2019-09-15 NOTE — Patient Instructions (Addendum)
Premier protein shakes    09/22/2019 Office Visit Endocrinology Honor Junes Justice Rocher, MD  [facility] [place], [place] [address]  [phone]  [phone] (Fax)     Let me know if you have shoulder pain and we will order xray of the shoulder that's hurting  Consider voltaren gel 4  X per day   Dove unscented or white soap bar soap avoid a lot of fragrance  Debrox ear wax drops left ear 5-10 drops x 1 week over the counter    Shoulder Range of Motion Exercises Shoulder range of motion (ROM) exercises are done to keep the shoulder moving freely or to increase movement. They are often recommended for people who have shoulder pain or stiffness or who are recovering from a shoulder surgery. Phase 1 exercises When you are able, do this exercise 1-2 times per day for 30-60 seconds in each direction, or as directed by your health care provider. Pendulum exercise To do this exercise while sitting: 1. Sit in a chair or at the edge of your bed with your feet flat on the floor. 2. Let your affected arm hang down in front of you over the edge of the bed or chair. 3. Relax your shoulder, arm, and hand. [place] your body so your arm gently swings in small circles. You can also use your unaffected arm to start the motion. 5. Repeat changing the direction of the circles, swinging your arm left and right, and swinging your arm forward and back. To do this exercise while standing: 1. Stand next to a sturdy chair or table, and hold on to it with your hand on your unaffected side. 2. Bend forward at the waist. 3. Bend your knees slightly. 4. Relax your shoulder, arm, and hand. 5. While keeping your shoulder relaxed, use body motion to swing your arm in small circles. 6. Repeat changing the direction of the circles, swinging your arm left and right, and swinging your arm forward and back. 7. Between exercises, stand up tall and take a short break to relax your lower back.  Phase 2  exercises Do these exercises 1-2 times per day or as told by your health care provider. Hold each stretch for 30 seconds, and repeat 3 times. Do the exercises with one or both arms as instructed by your health care provider. For these exercises, sit at a table with your hand and arm supported by the table. A chair that slides easily or has wheels can be helpful. External rotation 1. Turn your chair so that your affected side is nearest to the table. 2. Place your forearm on the table to your side. Bend your elbow about 90 at the elbow (right angle) and place your hand palm facing down on the table. Your elbow should be about 6 inches away from your side. 3. Keeping your arm on the table, lean your body forward. Abduction 1. Turn your chair so that your affected side is nearest to the table. 2. Place your forearm and hand on the table so that your thumb points toward the ceiling and your arm is straight out to your side. 3. Slide your hand out to the side and away from you, using your unaffected arm to do the work. 4. To increase the stretch, you can slide your chair away from the table. Flexion: forward stretch 1. Sit facing the table. Place your hand and elbow on the table in front of you. 2. Slide your hand forward and away from  you, using your unaffected arm to do the work. 3. To increase the stretch, you can slide your chair backward. Phase 3 exercises Do these exercises 1-2 times per day or as told by your health care provider. Hold each stretch for 30 seconds, and repeat 3 times. Do the exercises with one or both arms as instructed by your health care provider. Cross-body stretch: posterior capsule stretch 1. Lift your arm straight out in front of you. 2. Bend your arm 90 at the elbow (right angle) so your forearm moves across your body. 3. Use your other arm to gently pull the elbow across your body, toward your other shoulder. Wall climbs 1. Stand with your affected arm extended out  to the side with your hand resting on a door frame. 2. Slide your hand slowly up the door frame. 3. To increase the stretch, step through the door frame. Keep your body upright and do not lean. Wand exercises You will need a cane, a piece of PVC pipe, or a sturdy wooden dowel for wand exercises. Flexion To do this exercise while standing: 1. Hold the wand with both of your hands, palms down. 2. Using the other arm to help, lift your arms up and over your head, if able. 3. Push upward with your other arm to gently increase the stretch. To do this exercise while lying down: 1. Lie on your back with your elbows resting on the floor and the wand in both your hands. Your hands will be palm down, or pointing toward your feet. 2. Lift your hands toward the ceiling, using your unaffected arm to help if needed. 3. Bring your arms overhead as able, using your unaffected arm to help if needed. Internal rotation 1. Stand while holding the wand behind you with both hands. Your unaffected arm should be extended above your head with the arm of the affected side extended behind you at the level of your waist. The wand should be pointing straight up and down as you hold it. 2. Slowly pull the wand up behind your back by straightening the elbow of your unaffected arm and bending the elbow of your affected arm. External rotation 1. Lie on your back with your affected upper arm supported on a small pillow or rolled towel. When you first do this exercise, keep your upper arm close to your body. Over time, bring your arm up to a 90 angle out to the side. 2. Hold the wand across your stomach and with both hands palm up. Your elbow on your affected side should be bent at a 90 angle. 3. Use your unaffected side to help push your forearm away from you and toward the floor. Keep your elbow on your affected side bent at a 90 angle. Contact a health care provider if you have:  New or increasing pain.  New numbness,  tingling, weakness, or discoloration in your arm or hand. This information is not intended to replace advice given to you by your health care provider. Make sure you discuss any questions you have with your health care provider. Document Revised: 03/20/2017 Document Reviewed: 03/20/2017 [employer] Patient Education  [employer].  Shoulder Exercises Ask your health care provider which exercises are safe for you. Do exercises exactly as told by your health care provider and adjust them as directed. It is normal to feel mild stretching, pulling, tightness, or discomfort as you do these exercises. Stop right away if you feel sudden pain or your pain gets worse.  Do not begin these exercises until told by your health care provider. Stretching exercises External rotation and abduction This exercise is sometimes called corner stretch. This exercise rotates your arm outward (external rotation) and moves your arm out from your body (abduction). 1. Stand in a doorway with one of your feet slightly in front of the other. This is called a staggered stance. If you cannot reach your forearms to the door frame, stand facing a corner of a room. 2. Choose one of the following positions as told by your health care provider: ? Place your hands and forearms on the door frame above your head. ? Place your hands and forearms on the door frame at the height of your head. ? Place your hands on the door frame at the height of your elbows. 3. Slowly move your weight onto your front foot until you feel a stretch across your chest and in the front of your shoulders. Keep your head and chest upright and keep your abdominal muscles tight. 4. Hold for __________ seconds. 5. To release the stretch, shift your weight to your back foot. Repeat __________ times. Complete this exercise __________ times a day. Extension, standing 1. Stand and hold a broomstick, a cane, or a similar object behind your back. ? Your hands should  be a little wider than shoulder width apart. ? Your palms should face away from your back. 2. Keeping your elbows straight and your shoulder muscles relaxed, move the stick away from your body until you feel a stretch in your shoulders (extension). ? Avoid shrugging your shoulders while you move the stick. Keep your shoulder blades tucked down toward the middle of your back. 3. Hold for __________ seconds. 4. Slowly return to the starting position. Repeat __________ times. Complete this exercise __________ times a day. Range-of-motion exercises Pendulum  1. Stand near a wall or a surface that you can hold onto for balance. 2. Bend at the waist and let your left / right arm hang straight down. Use your other arm to support you. Keep your back straight and do not lock your knees. 3. Relax your left / right arm and shoulder muscles, and move your hips and your trunk so your left / right arm swings freely. Your arm should swing because of the motion of your body, not because you are using your arm or shoulder muscles. 4. Keep moving your hips and trunk so your arm swings in the following directions, as told by your health care provider: ? Side to side. ? Forward and backward. ? In clockwise and counterclockwise circles. 5. Continue each motion for __________ seconds, or for as long as told by your health care provider. 6. Slowly return to the starting position. Repeat __________ times. Complete this exercise __________ times a day. Shoulder flexion, standing  1. Stand and hold a broomstick, a cane, or a similar object. Place your hands a little more than shoulder width apart on the object. Your left / right hand should be palm up, and your other hand should be palm down. 2. Keep your elbow straight and your shoulder muscles relaxed. Push the stick up with your healthy arm to raise your left / right arm in front of your body, and then over your head until you feel a stretch in your shoulder  (flexion). ? Avoid shrugging your shoulder while you raise your arm. Keep your shoulder blade tucked down toward the middle of your back. 3. Hold for __________ seconds. 4. Slowly return to  the starting position. Repeat __________ times. Complete this exercise __________ times a day. Shoulder abduction, standing 1. Stand and hold a broomstick, a cane, or a similar object. Place your hands a little more than shoulder width apart on the object. Your left / right hand should be palm up, and your other hand should be palm down. 2. Keep your elbow straight and your shoulder muscles relaxed. Push the object across your body toward your left / right side. Raise your left / right arm to the side of your body (abduction) until you feel a stretch in your shoulder. ? Do not raise your arm above shoulder height unless your health care provider tells you to do that. ? If directed, raise your arm over your head. ? Avoid shrugging your shoulder while you raise your arm. Keep your shoulder blade tucked down toward the middle of your back. 3. Hold for __________ seconds. 4. Slowly return to the starting position. Repeat __________ times. Complete this exercise __________ times a day. Internal rotation  1. Place your left / right hand behind your back, palm up. 2. Use your other hand to dangle an exercise band, a towel, or a similar object over your shoulder. Grasp the band with your left / right hand so you are holding on to both ends. 3. Gently pull up on the band until you feel a stretch in the front of your left / right shoulder. The movement of your arm toward the center of your body is called internal rotation. ? Avoid shrugging your shoulder while you raise your arm. Keep your shoulder blade tucked down toward the middle of your back. 4. Hold for __________ seconds. 5. Release the stretch by letting go of the band and lowering your hands. Repeat __________ times. Complete this exercise __________ times a  day. Strengthening exercises External rotation  1. Sit in a stable chair without armrests. 2. Secure an exercise band to a stable object at elbow height on your left / right side. 3. Place a soft object, such as a folded towel or a small pillow, between your left / right upper arm and your body to move your elbow about 4 inches (10 cm) away from your side. 4. Hold the end of the exercise band so it is tight and there is no slack. 5. Keeping your elbow pressed against the soft object, slowly move your forearm out, away from your abdomen (external rotation). Keep your body steady so only your forearm moves. 6. Hold for __________ seconds. 7. Slowly return to the starting position. Repeat __________ times. Complete this exercise __________ times a day. Shoulder abduction  1. Sit in a stable chair without armrests, or stand up. 2. Hold a __________ weight in your left / right hand, or hold an exercise band with both hands. 3. Start with your arms straight down and your left / right palm facing in, toward your body. 4. Slowly lift your left / right hand out to your side (abduction). Do not lift your hand above shoulder height unless your health care provider tells you that this is safe. ? Keep your arms straight. ? Avoid shrugging your shoulder while you do this movement. Keep your shoulder blade tucked down toward the middle of your back. 5. Hold for __________ seconds. 6. Slowly lower your arm, and return to the starting position. Repeat __________ times. Complete this exercise __________ times a day. Shoulder extension 1. Sit in a stable chair without armrests, or stand up. 2. Secure an  exercise band to a stable object in front of you so it is at shoulder height. 3. Hold one end of the exercise band in each hand. Your palms should face each other. 4. Straighten your elbows and lift your hands up to shoulder height. 5. Step back, away from the secured end of the exercise band, until the band  is tight and there is no slack. 6. Squeeze your shoulder blades together as you pull your hands down to the sides of your thighs (extension). Stop when your hands are straight down by your sides. Do not let your hands go behind your body. 7. Hold for __________ seconds. 8. Slowly return to the starting position. Repeat __________ times. Complete this exercise __________ times a day. Shoulder row 1. Sit in a stable chair without armrests, or stand up. 2. Secure an exercise band to a stable object in front of you so it is at waist height. 3. Hold one end of the exercise band in each hand. Position your palms so that your thumbs are facing the ceiling (neutral position). 4. Bend each of your elbows to a 90-degree angle (right angle) and keep your upper arms at your sides. 5. Step back until the band is tight and there is no slack. 6. Slowly pull your elbows back behind you. 7. Hold for __________ seconds. 8. Slowly return to the starting position. Repeat __________ times. Complete this exercise __________ times a day. Shoulder press-ups  1. Sit in a stable chair that has armrests. Sit upright, with your feet flat on the floor. 2. Put your hands on the armrests so your elbows are bent and your fingers are pointing forward. Your hands should be about even with the sides of your body. 3. Push down on the armrests and use your arms to lift yourself off the chair. Straighten your elbows and lift yourself up as much as you comfortably can. ? Move your shoulder blades down, and avoid letting your shoulders move up toward your ears. ? Keep your feet on the ground. As you get stronger, your feet should support less of your body weight as you lift yourself up. 4. Hold for __________ seconds. 5. Slowly lower yourself back into the chair. Repeat __________ times. Complete this exercise __________ times a day. Wall push-ups  1. Stand so you are facing a stable wall. Your feet should be about one  arm-length away from the wall. 2. Lean forward and place your palms on the wall at shoulder height. 3. Keep your feet flat on the floor as you bend your elbows and lean forward toward the wall. 4. Hold for __________ seconds. 5. Straighten your elbows to push yourself back to the starting position. Repeat __________ times. Complete this exercise __________ times a day. This information is not intended to replace advice given to you by your health care provider. Make sure you discuss any questions you have with your health care provider. Document Revised: 05/30/2018 Document Reviewed: 03/07/2018 [employer] Patient Education  [employer].

## 2019-09-15 NOTE — Progress Notes (Signed)
Chief Complaint  Patient presents with   Follow-up   F/u  1. Insomnia trouble falling asleep tried Tylenol pm, warm milk advised no tylenol pm does help though  2. Osteoporosis did not help with prolia saw Dorothy Dorothy Church Dorothy Church endocrine and thinking about reclast infusion 09/22/19  3. Right Shoulder pain improved for now no pain if occurs again can rtc for xray disc otc voltaren gel prn  4. C/o itchy rash right lower chin using body wash any scent any brand 5. Hypothyroidism on synthryoid 50 mcg and tsh normal 07/2019   Results for Dorothy Dorothy Church, Dorothy Church (MRN [id number]) as of 09/15/2019 12:34  12/30/2018 11:01 TSH: 2.40  05/14/2019 10:01 TSH: 4.61 (H)  06/19/2019 11:29 TSH: 4.20  07/22/2019 09:53 TSH: 3.16   Review of Systems  Constitutional: Negative for weight loss.  HENT: Negative for hearing loss.   Eyes: Negative for blurred vision.  Respiratory: Negative for shortness of breath.   Cardiovascular: Negative for chest pain.  Gastrointestinal: Negative for abdominal pain.  Musculoskeletal: Negative for falls and joint pain.  Skin: Positive for itching and rash.  Neurological: Negative for headaches.  Psychiatric/Behavioral: Negative for depression.   Past Medical History:  Diagnosis Date   Abdominal pain    Abnormal CT of liver    Anxiety    Aortic stenosis    Aortic valve disorders    Arthritis    Asthma    Barrett's esophagus    Dorothy Church last EGD 02/2018 Dr. Gustavo Church    Colon polyps    COPD (chronic obstructive pulmonary disease) (HCC)    Depressive disorder    Diverticulosis    Fundic gland polyps of stomach, benign    Gastric polyps    Gastritis    GERD (gastroesophageal reflux disease)    Goiter    Hiatal hernia    small    Hypercholesteremia    Hypothyroidism    Impaired fasting glucose    Intrinsic asthma, unspecified    Left rib fracture    Obstructive sleep apnea (adult) (pediatric)    used cpap x 10 years per Dr. Mortimer Church off as of 12/30/18    OSA  (obstructive sleep apnea)    used cpap x 10 years per Dr. Mortimer Church off as of 12/30/18    Osteoporosis    Pneumonia    Pure hypercholesterolemia    Schatzki's ring    Syncope and collapse    TIA (transient ischemic attack)    Vitamin D deficiency    Past Surgical History:  Procedure Laterality Date   BREAST EXCISIONAL BIOPSY Left    COLONOSCOPY     COLONOSCOPY WITH ESOPHAGOGASTRODUODENOSCOPY (EGD)     COLONOSCOPY WITH PROPOFOL N/A 12/14/2014   Procedure: COLONOSCOPY WITH PROPOFOL;  Surgeon: Dorothy Sails, MD;  Location: [facility] ENDOSCOPY;  Service: Endoscopy;  Laterality: N/A;   COLONOSCOPY WITH PROPOFOL N/A 03/18/2018   Procedure: COLONOSCOPY WITH PROPOFOL;  Surgeon: Dorothy Sails, MD;  Location: [facility] ENDOSCOPY;  Service: Endoscopy;  Laterality: N/A;   ENUCLEATION     ESOPHAGOGASTRODUODENOSCOPY (EGD) WITH PROPOFOL N/A 12/14/2014   Procedure: ESOPHAGOGASTRODUODENOSCOPY (EGD) WITH PROPOFOL;  Surgeon: Dorothy Sails, MD;  Location: [facility] ENDOSCOPY;  Service: Endoscopy;  Laterality: N/A;   ESOPHAGOGASTRODUODENOSCOPY (EGD) WITH PROPOFOL N/A 10/18/2016   Procedure: ESOPHAGOGASTRODUODENOSCOPY (EGD) WITH PROPOFOL;  Surgeon: Dorothy Sails, MD;  Location: [facility] ENDOSCOPY;  Service: Endoscopy;  Laterality: N/A;   ESOPHAGOGASTRODUODENOSCOPY (EGD) WITH PROPOFOL N/A 03/18/2018   Procedure: ESOPHAGOGASTRODUODENOSCOPY (EGD) WITH PROPOFOL;  Surgeon: Dorothy Sails, MD;  Location: [facility] ENDOSCOPY;  Service: Endoscopy;  Laterality: N/A;   EYE SURGERY     HIP SURGERY Left    pin and plate   IR ANGIO INTRA EXTRACRAN SEL COM CAROTID INNOMINATE BILAT MOD SED  10/17/2018   IR ANGIO VERTEBRAL SEL VERTEBRAL BILAT MOD SED  10/17/2018   IR ANGIOGRAM EXTREMITY LEFT  10/17/2018   Left Total Knee Arthroplasty     Dr Marry Church and also had 39 years ago from 2020    prosthesis eye implant     REPLACEMENT UNICONDYLAR JOINT KNEE Left    TONSILLECTOMY     TOTAL KNEE REVISION Left  09/13/2017   Procedure: TOTAL KNEE REVISION;  Surgeon: Dorothy Leep, MD;  Location: [facility] ORS;  Service: Orthopedics;  Laterality: Left;   Family History  Problem Relation Age of Onset   Heart disease Father    Alcohol abuse Father    Breast cancer Mother    Bipolar disorder Brother    Dementia Sister        twin sister 69 dx'ed dementia   Social History   Socioeconomic History   Marital status: Divorced    Spouse name: Not on file   Number of children: Not on file   Years of education: Not on file   Highest education level: Not on file  Occupational History   Not on file  Tobacco Use   Smoking status: Never Smoker   Smokeless tobacco: Never Used  Vaping Use   Vaping Use: Never used  Substance and Sexual Activity   Alcohol use: Yes    Alcohol/week: 1.0 standard drink    Types: 1 Glasses of wine per week    Comment: occas. wine   Drug use: No   Sexual activity: Never  Other Topics Concern   Not on file  Social History Narrative   Has twin sister identical lives in [place] does not live with her    Lived in [place], moved to [place] two years ago.   Divorced.      Has a living will.   Would desire CPR, would not desire extraordinary measures.      1 boy and 1 girl daughter Dorothy Dorothy Church Dorothy Church, son In [place] Manufacturing engineer   Social Determinants of Radio broadcast assistant Strain:    Difficulty of Paying Living Expenses:   Food Insecurity:    Worried About Charity fundraiser in the Last Year:    Arboriculturist in the Last Year:   Transportation Needs:    Film/video editor (Medical):    Lack of Transportation (Non-Medical):   Physical Activity:    Days of Exercise per Week:    Minutes of Exercise per Session:   Stress:    Feeling of Stress :   Social Connections:    Frequency of Communication with Friends and Family:    Frequency of Social Gatherings with Friends and Family:    Attends Religious Services:     Active Member of Clubs or Organizations:    Attends Music therapist:    Marital Status:   Intimate Partner Violence:    Fear of Current or Ex-Partner:    Emotionally Abused:    Physically Abused:    Sexually Abused:    Current Meds  Medication Sig   acetaminophen (TYLENOL) 500 MG tablet Take 500 mg by mouth every 6 (six) hours as needed (PAIN).    albuterol (VENTOLIN HFA) 108 (90 BASE) MCG/ACT inhaler Inhale  2 puffs into the lungs every 6 (six) hours as needed for wheezing.   aspirin 325 MG tablet Take 325 mg by mouth daily.    Azelastine HCl 0.15 % SOLN Place 1 spray into the nose 2 (two) times daily as needed.   budesonide-formoterol (SYMBICORT) 160-4.5 MCG/ACT inhaler Inhale 2 puffs into the lungs 2 (two) times daily.   calcium carbonate (TUMS - DOSED IN MG ELEMENTAL CALCIUM) 500 MG chewable tablet Chew 1 tablet by mouth 4 (four) times daily as needed for indigestion or heartburn.   diclofenac sodium (VOLTAREN) 1 % GEL Apply 4 g topically 4 (four) times daily. (Patient taking differently: Apply 4 g topically 4 (four) times daily as needed (for pain). )   famotidine (PEPCID) 20 MG tablet Take 20 mg by mouth daily.   fluticasone (FLONASE) 50 MCG/ACT nasal spray Place into both nostrils daily.   levothyroxine (SYNTHROID) 50 MCG tablet TAKE 1 TABLET BY MOUTH EVERY DAY BEFORE BREAKFAST   loratadine (CLARITIN) 10 MG tablet Take 1 tablet (10 mg total) by mouth daily as needed for allergies.   PARoxetine (PAXIL) 30 MG tablet TAKE 1 TABLET BY MOUTH EVERY MORNING   Polyvinyl Alcohol-Povidone PF (REFRESH) 1.4-0.6 % SOLN Place 2 drops into both eyes 2 (two) times daily as needed (for dry eyes).   simvastatin (ZOCOR) 20 MG tablet Take 1 tablet (20 mg total) by mouth at bedtime.   [DISCONTINUED] levothyroxine (SYNTHROID) 50 MCG tablet TAKE 1 TABLET BY MOUTH EVERY DAY BEFORE BREAKFAST   Allergies  Allergen Reactions   Amoxicillin Other (See Comments)     Pencillins   Cefzil [Cefprozil] Other (See Comments)    Unknown   Cephalexin Other (See Comments)    Unknown   Propoxyphene Other (See Comments)    Unknown   Relafen [Nabumetone] Other (See Comments)    Unknown   Talwin [Pentazocine] Other (See Comments)    Unknown   Penicillins Other (See Comments)    Cannot remember reaction   Recent Results (from the past 2160 hour(s))  TSH     Status: None   Collection Time: 06/19/19 11:29 AM  Result Value Ref Range   TSH 4.20 0.35 - 4.50 uIU/mL  Urinalysis     Status: None   Collection Time: 06/19/19 11:29 AM  Result Value Ref Range   Color, Urine YELLOW YELLOW   APPearance CLEAR CLEAR   Specific Gravity, Urine 1.021 1.001 - 1.03   pH 7.5 5.0 - 8.0   Glucose, UA NEGATIVE NEGATIVE   Bilirubin Urine NEGATIVE NEGATIVE   Ketones, ur NEGATIVE NEGATIVE   Hgb urine dipstick NEGATIVE NEGATIVE   Protein, ur NEGATIVE NEGATIVE   Nitrite NEGATIVE NEGATIVE   Leukocytes,Ua NEGATIVE NEGATIVE  Urine Culture     Status: None   Collection Time: 06/19/19 11:29 AM   Specimen: Urine  Result Value Ref Range   MICRO NUMBER: [id number]    SPECIMEN QUALITY: Adequate    Sample Source URINE    STATUS: FINAL    Result: No Growth   TSH     Status: None   Collection Time: 07/22/19  9:53 AM  Result Value Ref Range   TSH 3.16 0.35 - 4.50 uIU/mL   Objective  Body mass index is 22.02 kg/m. Wt Readings from Last 3 Encounters:  09/15/19 136 lb 6.4 oz (61.9 kg)  07/07/19 142 lb (64.4 kg)  05/27/19 142 lb (64.4 kg)   Temp Readings from Last 3 Encounters:  09/15/19 98.1 F (36.7 C) (Oral)  07/07/19 (!) 96.5 F (35.8 C) (Temporal)  05/08/19 (!) 96.6 F (35.9 C)   BP Readings from Last 3 Encounters:  09/15/19 112/68  07/07/19 110/80  05/08/19 116/76   Pulse Readings from Last 3 Encounters:  09/15/19 90  07/07/19 88  05/08/19 94    Physical Exam Vitals and nursing note reviewed.  Constitutional:      Appearance: Normal appearance. She is  well-developed and well-groomed.  HENT:     Head: Normocephalic and atraumatic.  Eyes:     Conjunctiva/sclera: Conjunctivae normal.     Pupils: Pupils are equal, round, and reactive to light.  Cardiovascular:     Rate and Rhythm: Normal rate and regular rhythm.     Heart sounds: Normal heart sounds. No murmur heard.   Pulmonary:     Effort: Pulmonary effort is normal.     Breath sounds: Normal breath sounds.  Skin:    General: Skin is warm and dry.  Neurological:     General: No focal deficit present.     Mental Status: She is alert and oriented to person, place, and time. Mental status is at baseline.     Gait: Gait normal.  Psychiatric:        Attention and Perception: Attention and perception normal.        Mood and Affect: Mood and affect normal.        Speech: Speech normal.        Behavior: Behavior normal. Behavior is cooperative.        Thought Content: Thought content normal.        Cognition and Memory: Cognition normal.        Judgment: Judgment normal.     Assessment  Plan    Insomnia, unspecified type - Plan: traZODone (DESYREL) 25-50 MG tablet qhs prn   Eczema, unspecified type - Plan: hydrocortisone 2.5 % cream, DISCONTINUED: hydrocortisone 2.5 % cream  Hypothyroidism, unspecified type - Plan: levothyroxine (SYNTHROID) 50 MCG tablet  Osteoporosis, unspecified osteoporosis type, unspecified pathological fracture presence Fu Dorothy endocrine 09/22/19  Shoulder pain, unspecified chronicity, unspecified laterality  Call back if Xray wanted right or left shoulder anytime Shoulder exercises  voltaren gel   HM utd flu shot  prevnar utd  pna 23 had 12/19/18  shingrix had 2/2  covid vx 2/2 had   Consider Tdap in future td had 04/17/16   EGD/colonoscopy had 02/2018 Keith Church with erosive gastritis, small HH, gastric polpys, schzatski ring, diverticulosis, 3 mm colon polyp and IH   04/14/19 negative mammogram  DEXA h/o osteoporosis/penia on prolia since 2014    T score -2.3 from -2.4 and still right femur with -2.6  Dorothy endocrine 09/22/19   Never smoker   Skin no current issues Provider: Dr. Olivia Mackie McLean-Scocuzza-Internal Medicine

## 2019-09-22 DIAGNOSIS — M810 Age-related osteoporosis without current pathological fracture: Secondary | ICD-10-CM | POA: Diagnosis not present

## 2019-09-22 DIAGNOSIS — E559 Vitamin D deficiency, unspecified: Secondary | ICD-10-CM | POA: Diagnosis not present

## 2019-09-22 DIAGNOSIS — E039 Hypothyroidism, unspecified: Secondary | ICD-10-CM | POA: Diagnosis not present

## 2019-09-29 DIAGNOSIS — J45909 Unspecified asthma, uncomplicated: Secondary | ICD-10-CM | POA: Diagnosis not present

## 2019-09-30 ENCOUNTER — Telehealth: Payer: Self-pay | Admitting: Internal Medicine

## 2019-09-30 MED ORDER — AZITHROMYCIN 250 MG PO TABS: ORAL_TABLET | ORAL | 0 refills | Status: AC

## 2019-09-30 MED ORDER — PREDNISONE 10 MG (21) PO TBPK
ORAL_TABLET | ORAL | 0 refills | Status: DC
Start: 2019-09-30 — End: 2020-03-10

## 2019-09-30 NOTE — Telephone Encounter (Signed)
Recommend prednisone taper pack number 21 tablets of 10 mg follow directions in the package.  Also Azithromycin Z-Pak if not allergic.

## 2019-09-30 NOTE — Telephone Encounter (Signed)
Pt is aware of below recommendations and voiced her understanding.  Rx for zpak and prednisone has been sent to preferred pharmacy.  Nothing further is needed.

## 2019-09-30 NOTE — Telephone Encounter (Signed)
Called and spoke to patient.  Patient feels that she is currently having an asthma flare. C/o prod cough yellowish mucus, wheezing and increased sob x10d.  Denied fever, chills or sweats.  Using albuterol 2-3x daily with some relief.   Dr. Patsey Berthold, please advise as Dr. Mortimer Fries is unavailable.

## 2019-10-30 DIAGNOSIS — J45909 Unspecified asthma, uncomplicated: Secondary | ICD-10-CM | POA: Diagnosis not present

## 2019-11-20 ENCOUNTER — Other Ambulatory Visit: Payer: Self-pay | Admitting: Internal Medicine

## 2019-11-20 DIAGNOSIS — G4700 Insomnia, unspecified: Secondary | ICD-10-CM

## 2019-11-29 DIAGNOSIS — J45909 Unspecified asthma, uncomplicated: Secondary | ICD-10-CM | POA: Diagnosis not present

## 2019-11-30 DIAGNOSIS — R69 Illness, unspecified: Secondary | ICD-10-CM | POA: Diagnosis not present

## 2019-12-02 DIAGNOSIS — H2511 Age-related nuclear cataract, right eye: Secondary | ICD-10-CM | POA: Diagnosis not present

## 2019-12-02 DIAGNOSIS — H524 Presbyopia: Secondary | ICD-10-CM | POA: Diagnosis not present

## 2019-12-03 DIAGNOSIS — M810 Age-related osteoporosis without current pathological fracture: Secondary | ICD-10-CM | POA: Diagnosis not present

## 2019-12-23 DIAGNOSIS — H40001 Preglaucoma, unspecified, right eye: Secondary | ICD-10-CM | POA: Diagnosis not present

## 2019-12-30 DIAGNOSIS — J45909 Unspecified asthma, uncomplicated: Secondary | ICD-10-CM | POA: Diagnosis not present

## 2020-01-17 ENCOUNTER — Other Ambulatory Visit: Payer: Self-pay | Admitting: Family Medicine

## 2020-01-18 ENCOUNTER — Other Ambulatory Visit: Payer: Self-pay | Admitting: Internal Medicine

## 2020-01-18 MED ORDER — SIMVASTATIN 20 MG PO TABS: 20.0000 mg | ORAL_TABLET | Freq: Every day | ORAL | 3 refills | Status: DC

## 2020-01-18 NOTE — Telephone Encounter (Signed)
Pharmacy requests refill on: Simvastatin 20 mg   LAST REFILL: 07/21/2019 (Q-90, R-3) LAST OV: 09/15/2019 NEXT OV: Not Scheduled  PHARMACY: [place], [place]   Too early for refill

## 2020-01-22 ENCOUNTER — Telehealth: Payer: Self-pay | Admitting: Internal Medicine

## 2020-01-22 NOTE — Telephone Encounter (Signed)
Spoke to patient via telephone.  I offered appointment with NP for 01/26/2020. This did not work with patient's schedule. She is not willing to travel to [place].  Recall has been placed for Jan 2021, as Dr. Mortimer Fries or NP's have availability in December.  Nothing further needed.

## 2020-02-24 DIAGNOSIS — H401110 Primary open-angle glaucoma, right eye, stage unspecified: Secondary | ICD-10-CM | POA: Diagnosis not present

## 2020-03-03 ENCOUNTER — Other Ambulatory Visit: Payer: Self-pay

## 2020-03-03 DIAGNOSIS — E039 Hypothyroidism, unspecified: Secondary | ICD-10-CM

## 2020-03-03 DIAGNOSIS — J309 Allergic rhinitis, unspecified: Secondary | ICD-10-CM

## 2020-03-03 MED ORDER — LORATADINE 10 MG PO TABS: 10.0000 mg | ORAL_TABLET | Freq: Every day | ORAL | 3 refills | Status: DC | PRN

## 2020-03-03 MED ORDER — LEVOTHYROXINE SODIUM 50 MCG PO TABS: ORAL_TABLET | ORAL | 3 refills | Status: DC

## 2020-03-10 ENCOUNTER — Ambulatory Visit (INDEPENDENT_AMBULATORY_CARE_PROVIDER_SITE_OTHER): Payer: Medicare HMO | Admitting: Internal Medicine

## 2020-03-10 ENCOUNTER — Encounter: Payer: Self-pay | Admitting: Internal Medicine

## 2020-03-10 ENCOUNTER — Other Ambulatory Visit: Payer: Self-pay

## 2020-03-10 VITALS — BP 130/80 | HR 98 | Temp 97.1°F | Ht 66.0 in | Wt 143.4 lb

## 2020-03-10 DIAGNOSIS — J4520 Mild intermittent asthma, uncomplicated: Secondary | ICD-10-CM

## 2020-03-10 MED ORDER — ALBUTEROL SULFATE HFA 108 (90 BASE) MCG/ACT IN AERS: 2.0000 | INHALATION_SPRAY | Freq: Four times a day (QID) | RESPIRATORY_TRACT | 5 refills | Status: DC | PRN

## 2020-03-10 MED ORDER — BUDESONIDE-FORMOTEROL FUMARATE 160-4.5 MCG/ACT IN AERO
2.0000 | INHALATION_SPRAY | Freq: Two times a day (BID) | RESPIRATORY_TRACT | 10 refills | Status: DC
Start: 2020-03-10 — End: 2021-03-10

## 2020-03-10 NOTE — Progress Notes (Signed)
[facility] Consultation      MRN# [id number] KAYTE BORCHARD December 03, 1935    Brief History:  85 year old female past medical history of asthma  Patient states she had asthma during childhood where she had frequent hospitalizations but no intubations. Patient states her breathing is trouble with certain triggers such as mold, dust, damp areas. She was told about 45 years ago she had ASTHMA and was also placed on Symbicort at that time along with as needed albuterol. She has a history of obstructive sleep apnea diagnosed 8 years ago and she's currently on CPAP; she doesn't recall her CPAP settings. Patient states she never smoked tobacco had any secondhand exposure.  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   CC: Follow up ASTHMA  HPI Follow-up asthma  Doing well with once daily Symbicort    No exacerbation at this time No evidence of heart failure at this time No evidence or signs of infection at this time No respiratory distress No fevers, chills, nausea, vomiting, diarrhea No evidence of lower extremity edema No evidence hemoptysis follow-up asthma Doing well on once daily Symbicort   PATIENT HAS HAD NEGATIVE SLEEP STUDY 1 YEAR AGO PATIENT DOES NOT HAVE OSA      Medication:    Current Outpatient Medications:  .  acetaminophen (TYLENOL) 500 MG tablet, Take 500 mg by mouth every 6 (six) hours as needed (PAIN). , Disp: , Rfl:  .  albuterol (VENTOLIN HFA) 108 (90 BASE) MCG/ACT inhaler, Inhale 2 puffs into the lungs every 6 (six) hours as needed for wheezing., Disp: 18 g, Rfl: 5 .  aspirin 325 MG tablet, Take 325 mg by mouth daily. , Disp: , Rfl:  .  Azelastine HCl 0.15 % SOLN, Place 1 spray into the nose 2 (two) times daily as needed., Disp: 30 mL, Rfl: 11 .  budesonide-formoterol (SYMBICORT) 160-4.5 MCG/ACT inhaler, Inhale 2 puffs into the lungs 2 (two) times daily.,  Disp: 3 Inhaler, Rfl: 3 .  calcium carbonate (TUMS - DOSED IN MG ELEMENTAL CALCIUM) 500 MG chewable tablet, Chew 1 tablet by mouth 4 (four) times daily as needed for indigestion or heartburn., Disp: , Rfl:  .  diclofenac sodium (VOLTAREN) 1 % GEL, Apply 4 g topically 4 (four) times daily. (Patient taking differently: Apply 4 g topically 4 (four) times daily as needed (for pain). ), Disp: 5 Tube, Rfl: 11 .  famotidine (PEPCID) 20 MG tablet, Take 20 mg by mouth daily., Disp: , Rfl:  .  fluticasone (FLONASE) 50 MCG/ACT nasal spray, Place into both nostrils daily., Disp: , Rfl:  .  hydrocortisone 2.5 % cream, Apply topically 2 (two) times daily. Prn right lower chin, Disp: 60 g, Rfl: 11 .  levothyroxine (SYNTHROID) 50 MCG tablet, TAKE 1 TABLET BY MOUTH EVERY DAY BEFORE BREAKFAST, Disp: 90 tablet, Rfl: 3 .  loratadine (CLARITIN) 10 MG tablet, Take 1 tablet (10 mg total) by mouth daily as needed for allergies., Disp: 90 tablet, Rfl: 3 .  PARoxetine (PAXIL) 30 MG tablet, TAKE 1 TABLET BY MOUTH EVERY MORNING, Disp: 90 tablet, Rfl: 3 .  Polyvinyl Alcohol-Povidone PF (REFRESH) 1.4-0.6 % SOLN, Place 2 drops into both eyes 2 (two) times daily as needed (for dry eyes)., Disp: , Rfl:  .  predniSONE (STERAPRED UNI-PAK 21 TAB) 10 MG (21) TBPK tablet, Use as directed., Disp: 21 tablet, Rfl: 0 .  simvastatin (ZOCOR) 20 MG tablet,  TAKE 1 TABLET BY MOUTH EVERYDAY AT BEDTIME, Disp: 90 tablet, Rfl: 3 .  simvastatin (ZOCOR) 20 MG tablet, Take 1 tablet (20 mg total) by mouth at bedtime., Disp: 90 tablet, Rfl: 3 .  traZODone (DESYREL) 50 MG tablet, TAKE 0.5-1 TABLETS (25-50 MG TOTAL) BY MOUTH AT BEDTIME AS NEEDED FOR SLEEP., Disp: 90 tablet, Rfl: 4    Review of Systems  Constitutional: Negative for chills and fever.  Eyes: Negative for blurred vision.  Respiratory: Negative for cough, hemoptysis, sputum production and shortness of breath.   Cardiovascular: Negative for chest pain and palpitations.  Gastrointestinal:  Negative for heartburn.  Genitourinary: Negative for dysuria.  Musculoskeletal: Negative for myalgias.  Skin: Negative for itching and rash.  Neurological: Negative for dizziness and headaches.  Endo/Heme/Allergies: Does not bruise/bleed easily.  Psychiatric/Behavioral: Negative for depression.      Allergies:  Amoxicillin, Cefzil [cefprozil], Cephalexin, Propoxyphene, Relafen [nabumetone], Talwin [pentazocine], and Penicillins   BP 130/80 (BP Location: Left Arm, Patient Position: Sitting, Cuff Size: Normal)   Pulse 98   Temp (!) 97.1 F (36.2 C) (Temporal)   Ht 5\' 6"  (1.676 m)   Wt 143 lb 6.4 oz (65 kg)   SpO2 93%   BMI 23.15 kg/m   Physical Examination:   General Appearance: No distress  Neuro:without focal findings,  speech normal,  HEENT: PERRLA, EOM intact.   Pulmonary: normal breath sounds, No wheezing.  CardiovascularNormal S1,S2.  No m/r/g.   Abdomen: Benign, Soft, non-tender. Renal:  No costovertebral tenderness  GU:  Not performed at this time. Endoc: No evident thyromegaly Skin:   warm, no rashes, no ecchymosis  Extremities: normal, no cyanosis, clubbing. PSYCHIATRIC: Mood, affect within normal limits.   ALL OTHER ROS ARE NEGATIVE   Assessment and Plan:  85 year old pleasant white female seen today for follow-up assessment of asthma which seems to be well controlled at this time with mild intermittent asthma    Asthma mild intermittent well-controlled  Continue Spiriva Symbicort as prescribed using ONCE daily  No need for systemic steroids No indication for antibiotics at this time   OSA patient does not have sleep apnea There is no evidence of sleep apnea based on sleep study 1 year ago    COVID-19 EDUCATION: The signs and symptoms of COVID-19 were discussed with the patient and how to seek care for testing.  The importance of social distancing was discussed today. Hand Washing Techniques and avoid touching face was  advised.     MEDICATION ADJUSTMENTS/LABS AND TESTS ORDERED: Continue Symbciort   CURRENT MEDICATIONS REVIEWED AT LENGTH WITH PATIENT TODAY   Patient satisfied with Plan of action and management. All questions answered  Follow up in 1 year  Total time spent 22 minutes  Maretta Bees Patricia Pesa, M.D.  [facility]  Medical Director [place] Director [facility] Cardio-Pulmonary Department

## 2020-03-10 NOTE — Patient Instructions (Signed)
Continue inhalers as prescribed Avoid allergens Avoid secondhand smoke exposure Can use albuterol 2 to 4 puffs prior to exertion

## 2020-03-23 ENCOUNTER — Ambulatory Visit: Payer: Medicare HMO | Admitting: Internal Medicine

## 2020-03-23 ENCOUNTER — Telehealth: Payer: Self-pay | Admitting: Internal Medicine

## 2020-03-23 NOTE — Telephone Encounter (Signed)
Patient no-showed today's appointment; appointment was for 03/23/20 at 11:00 am, provider notified for review of record. Letter sent to reschedule.

## 2020-04-05 DIAGNOSIS — S0511XA Contusion of eyeball and orbital tissues, right eye, initial encounter: Secondary | ICD-10-CM | POA: Diagnosis not present

## 2020-04-07 ENCOUNTER — Ambulatory Visit
Admission: RE | Admit: 2020-04-07 | Discharge: 2020-04-07 | Disposition: A | Discharge status: Home / Self Care | Payer: Medicare HMO | Source: Ambulatory Visit | Attending: Internal Medicine | Admitting: Internal Medicine

## 2020-04-07 ENCOUNTER — Encounter: Payer: Self-pay | Admitting: Internal Medicine

## 2020-04-07 ENCOUNTER — Ambulatory Visit (INDEPENDENT_AMBULATORY_CARE_PROVIDER_SITE_OTHER): Payer: Medicare HMO | Admitting: Internal Medicine

## 2020-04-07 ENCOUNTER — Other Ambulatory Visit: Payer: Self-pay

## 2020-04-07 VITALS — BP 122/82 | HR 90 | Temp 97.5°F | Ht 66.0 in | Wt 142.0 lb

## 2020-04-07 DIAGNOSIS — R110 Nausea: Secondary | ICD-10-CM | POA: Insufficient documentation

## 2020-04-07 DIAGNOSIS — S0993XA Unspecified injury of face, initial encounter: Secondary | ICD-10-CM | POA: Diagnosis not present

## 2020-04-07 DIAGNOSIS — Z1231 Encounter for screening mammogram for malignant neoplasm of breast: Secondary | ICD-10-CM | POA: Diagnosis not present

## 2020-04-07 DIAGNOSIS — R519 Headache, unspecified: Secondary | ICD-10-CM | POA: Insufficient documentation

## 2020-04-07 DIAGNOSIS — Z043 Encounter for examination and observation following other accident: Secondary | ICD-10-CM | POA: Diagnosis not present

## 2020-04-07 DIAGNOSIS — R42 Dizziness and giddiness: Secondary | ICD-10-CM | POA: Diagnosis not present

## 2020-04-07 DIAGNOSIS — M542 Cervicalgia: Secondary | ICD-10-CM | POA: Insufficient documentation

## 2020-04-07 DIAGNOSIS — S0990XA Unspecified injury of head, initial encounter: Secondary | ICD-10-CM

## 2020-04-07 DIAGNOSIS — S199XXA Unspecified injury of neck, initial encounter: Secondary | ICD-10-CM

## 2020-04-07 DIAGNOSIS — S0180XA Unspecified open wound of other part of head, initial encounter: Secondary | ICD-10-CM | POA: Diagnosis not present

## 2020-04-07 DIAGNOSIS — R269 Unspecified abnormalities of gait and mobility: Secondary | ICD-10-CM

## 2020-04-07 DIAGNOSIS — S0003XA Contusion of scalp, initial encounter: Secondary | ICD-10-CM | POA: Diagnosis not present

## 2020-04-07 DIAGNOSIS — S0083XA Contusion of other part of head, initial encounter: Secondary | ICD-10-CM | POA: Diagnosis not present

## 2020-04-07 MED ORDER — MUPIROCIN 2 % EX OINT: 1.0000 "application " | TOPICAL_OINTMENT | Freq: Two times a day (BID) | CUTANEOUS | 0 refills | Status: DC

## 2020-04-07 NOTE — Patient Instructions (Addendum)
pepcid can add for reflux    Post-Concussion Syndrome  Cervical Strain and Sprain Rehab Ask your health care provider which exercises are safe for you. Do exercises exactly as told by your health care provider and adjust them as directed. It is normal to feel mild stretching, pulling, tightness, or discomfort as you do these exercises. Stop right away if you feel sudden pain or your pain gets worse. Do not begin these exercises until told by your health care provider. Stretching and range-of-motion exercises Cervical side bending 1. Using good posture, sit on a stable chair or stand up. 2. Without moving your shoulders, slowly tilt your left / right ear to your shoulder until you feel a stretch in the opposite side neck muscles. You should be looking straight ahead. 3. Hold for __________ seconds. 4. Repeat with the other side of your neck. Repeat __________ times. Complete this exercise __________ times a day.   Cervical rotation 1. Using good posture, sit on a stable chair or stand up. 2. Slowly turn your head to the side as if you are looking over your left / right shoulder. ? Keep your eyes level with the ground. ? Stop when you feel a stretch along the side and the back of your neck. 3. Hold for __________ seconds. 4. Repeat this by turning to your other side. Repeat __________ times. Complete this exercise __________ times a day.   Thoracic extension and pectoral stretch 1. Roll a towel or a small blanket so it is about 4 inches (10 cm) in diameter. 2. Lie down on your back on a firm surface. 3. Put the towel lengthwise, under your spine in the middle of your back. It should not be under your shoulder blades. The towel should line up with your spine from your middle back to your lower back. 4. Put your hands behind your head and let your elbows fall out to your sides. 5. Hold for __________ seconds. Repeat __________ times. Complete this exercise __________ times a  day. Strengthening exercises Isometric upper cervical flexion 1. Lie on your back with a thin pillow behind your head and a small rolled-up towel under your neck. 2. Gently tuck your chin toward your chest and nod your head down to look toward your feet. Do not lift your head off the pillow. 3. Hold for __________ seconds. 4. Release the tension slowly. Relax your neck muscles completely before you repeat this exercise. Repeat __________ times. Complete this exercise __________ times a day. Isometric cervical extension 1. Stand about 6 inches (15 cm) away from a wall, with your back facing the wall. 2. Place a soft object, about 6-8 inches (15-20 cm) in diameter, between the back of your head and the wall. A soft object could be a small pillow, a ball, or a folded towel. 3. Gently tilt your head back and press into the soft object. Keep your jaw and forehead relaxed. 4. Hold for __________ seconds. 5. Release the tension slowly. Relax your neck muscles completely before you repeat this exercise. Repeat __________ times. Complete this exercise __________ times a day.   Posture and body mechanics Body mechanics refers to the movements and positions of your body while you do your daily activities. Posture is part of body mechanics. Good posture and healthy body mechanics can help to relieve stress in your body's tissues and joints. Good posture means that your spine is in its natural S-curve position (your spine is neutral), your shoulders are pulled back slightly, and  your head is not tipped forward. The following are general guidelines for applying improved posture and body mechanics to your everyday activities. Sitting 1. When sitting, keep your spine neutral and keep your feet flat on the floor. Use a footrest, if necessary, and keep your thighs parallel to the floor. Avoid rounding your shoulders, and avoid tilting your head forward. 2. When working at a desk or a computer, keep your desk at a  height where your hands are slightly lower than your elbows. Slide your chair under your desk so you are close enough to maintain good posture. 3. When working at a computer, place your monitor at a height where you are looking straight ahead and you do not have to tilt your head forward or downward to look at the screen.   Standing  When standing, keep your spine neutral and keep your feet about hip-width apart. Keep a slight bend in your knees. Your ears, shoulders, and hips should line up.  When you do a task in which you stand in one place for a long time, place one foot up on a stable object that is 2-4 inches (5-10 cm) high, such as a footstool. This helps keep your spine neutral.   Resting When lying down and resting, avoid positions that are most painful for you. Try to support your neck in a neutral position. You can use a contour pillow or a small rolled-up towel. Your pillow should support your neck but not push on it. This information is not intended to replace advice given to you by your health care provider. Make sure you discuss any questions you have with your health care provider. Document Revised: 05/28/2018 Document Reviewed: 11/06/2017 Elsevier Patient Education  2021 [place].  Neck Exercises Ask your health care provider which exercises are safe for you. Do exercises exactly as told by your health care provider and adjust them as directed. It is normal to feel mild stretching, pulling, tightness, or discomfort as you do these exercises. Stop right away if you feel sudden pain or your pain gets worse. Do not begin these exercises until told by your health care provider. Neck exercises can be important for many reasons. They can improve strength and maintain flexibility in your neck, which will help your upper back and prevent neck pain. Stretching exercises Rotation neck stretching 5. Sit in a chair or stand up. 6. Place your feet flat on the floor, shoulder width  apart. 7. Slowly turn your head (rotate) to the right until a slight stretch is felt. Turn it all the way to the right so you can look over your right shoulder. Do not tilt or tip your head. 8. Hold this position for 10-30 seconds. 9. Slowly turn your head (rotate) to the left until a slight stretch is felt. Turn it all the way to the left so you can look over your left shoulder. Do not tilt or tip your head. 10. Hold this position for 10-30 seconds. Repeat __________ times. Complete this exercise __________ times a day.   Neck retraction 5. Sit in a sturdy chair or stand up. 6. Look straight ahead. Do not bend your neck. 7. Use your fingers to push your chin backward (retraction). Do not bend your neck for this movement. Continue to face straight ahead. If you are doing the exercise properly, you will feel a slight sensation in your throat and a stretch at the back of your neck. 8. Hold the stretch for 1-2 seconds. Repeat  __________ times. Complete this exercise __________ times a day. Strengthening exercises Neck press 6. Lie on your back on a firm bed or on the floor with a pillow under your head. 7. Use your neck muscles to push your head down on the pillow and straighten your spine. 8. Hold the position as well as you can. Keep your head facing up (in a neutral position) and your chin tucked. 9. Slowly count to 5 while holding this position. Repeat __________ times. Complete this exercise __________ times a day. Isometrics These are exercises in which you strengthen the muscles in your neck while keeping your neck still (isometrics). 5. Sit in a supportive chair and place your hand on your forehead. 6. Keep your head and face facing straight ahead. Do not flex or extend your neck while doing isometrics. 7. Push forward with your head and neck while pushing back with your hand. Hold for 10 seconds. 8. Do the sequence again, this time putting your hand against the back of your head. Use  your head and neck to push backward against the hand pressure. 9. Finally, do the same exercise on either side of your head, pushing sideways against the pressure of your hand. Repeat __________ times. Complete this exercise __________ times a day. Prone head lifts 6. Lie face-down (prone position), resting on your elbows so that your chest and upper back are raised. 7. Start with your head facing downward, near your chest. Position your chin either on or near your chest. 8. Slowly lift your head upward. Lift until you are looking straight ahead. Then continue lifting your head as far back as you can comfortably stretch. 9. Hold your head up for 5 seconds. Then slowly lower it to your starting position. Repeat __________ times. Complete this exercise __________ times a day. Supine head lifts 4. Lie on your back (supine position), bending your knees to point to the ceiling and keeping your feet flat on the floor. 5. Lift your head slowly off the floor, raising your chin toward your chest. 6. Hold for 5 seconds. Repeat __________ times. Complete this exercise __________ times a day. Scapular retraction 1. Stand with your arms at your sides. Look straight ahead. 2. Slowly pull both shoulders (scapulae) backward and downward (retraction) until you feel a stretch between your shoulder blades in your upper back. 3. Hold for 10-30 seconds. 4. Relax and repeat. Repeat __________ times. Complete this exercise __________ times a day. Contact a health care provider if:  Your neck pain or discomfort gets much worse when you do an exercise.  Your neck pain or discomfort does not improve within 2 hours after you exercise. If you have any of these problems, stop exercising right away. Do not do the exercises again unless your health care provider says that you can. Get help right away if:  You develop sudden, severe neck pain. If this happens, stop exercising right away. Do not do the exercises again  unless your health care provider says that you can. This information is not intended to replace advice given to you by your health care provider. Make sure you discuss any questions you have with your health care provider. Document Revised: 12/04/2017 Document Reviewed: 12/04/2017 Elsevier Patient Education  2021 [place].  A concussion is a brain injury from a direct hit to the head or body. This hit causes the brain to shake quickly back and forth inside the skull. This can damage brain cells and cause chemical changes in the brain.  Concussions are usually not life-threatening but can cause serious symptoms. Post-concussion syndrome is when symptoms that occur after a concussion last longer than normal. These symptoms can last from weeks to months. What are the causes? The cause of this condition is not known. It can happen whether your head injury was mild or severe. What increases the risk? You are more likely to develop this condition if:  You are female.  You are a child, teen, or young adult.  You have had a past head injury.  You have a history of headaches.  You have depression or anxiety.  You have loss of consciousness or cannot remember the event (have amnesia of the event).  You have multiple symptoms or severe symptoms at the time of your concussion. What are the signs or symptoms? Symptoms of this condition include:  Physical symptoms. You may have: ? Headaches. ? Tiredness. ? Dizziness and weakness. ? Blurry vision and sensitivity to light. ? Hearing difficulties. ? Problems with balance.  Mental and emotional symptoms. You may have: ? Memory problems and trouble concentrating. ? Difficulty sleeping or staying asleep. ? Feelings of irritability. ? Anxiety or depression. ? Difficulty learning new things. How is this diagnosed? This condition may be diagnosed based on:  Your symptoms.  A description of your injury.  Your medical history.  Testing  your strength, balance, and nerve function (neurological examination). Your health care provider may order other tests, including brain imaging such as a CT scan or an MRI, and memory testing (neuropsychological testing). How is this treated? Treatment for this condition may depend on your symptoms. Symptoms usually go away on their own over time. Treatments may include:  Medicines for headaches, anxiety, depression, and trouble sleeping (insomnia).  Resting your brain and body for a few days after your injury.  Rehabilitation therapy, such as: ? Physical or occupational therapy. This may include exercises to help with balance and dizziness. ? Mental health counseling. A form of talk therapy called cognitive behavioral therapy (CBT) can be especially helpful. This therapy helps you set goals and follow up on the changes that you make. ? Speech therapy. ? Vision therapy. A brain and eye specialist can recommend treatments for vision problems. Follow these instructions at home: Medicines  Take over-the-counter and prescription medicines only as told by your health care provider.  Avoid opioid prescription pain medicines when recovering from a concussion. Activity  Limit your mental activities for the first few days after your injury. This may include not doing the following: ? Homework or job-related work. ? Complex thinking. ? Watching TV, and using a computer or phone. ? Playing memory games and puzzles.  Gradually return to your normal activity level. If a certain activity brings on your symptoms, stop or slow down until you can do the activity without it triggering your symptoms.  Limit physical activity, such as exercise or sports, for the first few days after a concussion. Gradually return to normal activity as told by your health care provider.  Rest. Rest helps your brain heal. Make sure you: ? Get plenty of sleep at night. Most adults should get at least 7-9 hours of sleep  each night. ? Rest during the day. Take naps or rest breaks when you feel tired.  Do not do high-risk activities that could cause a second concussion, such as riding a bike or playing sports. Having another concussion before the first one has healed can be dangerous. General instructions  Do not drink alcohol until  your health care provider says that you can.  Keep track of the frequency and the severity of your symptoms. Give this information to your health care provider.  Keep all follow-up visits as directed by your health care provider. This is important. This includes visits with specialists.   Contact a health care provider if:  Your symptoms do not improve.  You have another injury. Get help right away if you:  Have a severe or worsening headache.  Are confused.  Have trouble staying awake.  Faint.  Vomit.  Have weakness or numbness in any part of your body.  Have a seizure.  Have trouble speaking. Summary  Post-concussion syndrome is when symptoms that occur after a concussion last longer than normal.  Symptoms usually go away on their own over time. Depending on your symptoms, you may need treatment, such as medicines or rehabilitation therapy.  Rest your brain and body for a few days after your injury. Gradually return to normal activities as told by your health care provider.  Get plenty of sleep, and avoid alcohol and opioid pain medicines while recovering from a concussion. This information is not intended to replace advice given to you by your health care provider. Make sure you discuss any questions you have with your health care provider. Document Revised: 01/28/2019 Document Reviewed: 01/28/2019 Elsevier Patient Education  2021 [place] [employer] [facility] (pp. [phone]). [place], [place]. Elsevier."> Neurosurgery, 80(1), 6-15. Retrieved on August 10, 2018.[email or web address]"> Primary Care (5th ed., pp.  218-221). [place], [place]: Elsevier."> Rosen's Emergency Medicine: Concepts and Clinical Practice (9th ed., pp. 301-329). [place], [place]: Elsevier."> Neurosurgery, 75 Suppl 1, S3-15. Retrieved on August 10, 2018.[email or web address]">  Head Injury, Adult There are many types of head injuries. Head injuries can be as minor as a small bump, or they can be a serious medical issue. More severe head injuries include:  A jarring injury to the brain (concussion).  A bruise (contusion) of the brain. This means there is bleeding in the brain that can cause swelling.  A cracked skull (skull fracture).  Bleeding in the brain that collects, clots, and forms a bump (hematoma). After a head injury, most problems occur within the first 24 hours, but side effects may occur up to 7-10 days after the injury. It is important to watch your condition for any changes. You may need to be observed in the emergency department or urgent care, or you may be admitted to the hospital. What are the causes? There are many possible causes of a head injury. Serious head injuries may be caused by car accidents, bicycle or motorcycle accidents, sports injuries, falls, or being struck by an object. What are the symptoms? Symptoms of a head injury include a contusion, bump, or bleeding at the site of the injury. Other physical symptoms may include:  Headache.  Nausea or vomiting.  Dizziness.  Blurred or double vision.  Being uncomfortable around bright lights or loud noises.  Seizures.  Feeling tired.  Trouble being awakened.  Loss of consciousness. Mental or emotional symptoms may include:  Irritability.  Confusion and memory problems.  Poor attention and concentration.  Changes in eating or sleeping habits.  Anxiety or depression. How is this diagnosed? This condition can usually be diagnosed based on your symptoms, a description of the injury, and a physical exam. You may also have  imaging tests done, such as a CT scan or an MRI. How is this treated? Treatment for this condition depends on the severity  and type of injury you have. The main goal of treatment is to prevent complications and allow the brain time to heal. Mild head injury If you have a mild head injury, you may be sent home, and treatment may include:  Observation. A responsible adult should stay with you for 24 hours after your injury and check on you often.  Physical rest.  Brain rest.  Pain medicines. Severe head injury If you have a severe head injury, treatment may include:  Close observation. This includes hospitalization with the following care: ? Frequent physical exams. ? Frequent checks of how your brain and nervous system are working (neurological status). ? Checking your blood pressure and oxygen levels.  Medicines to relieve pain, prevent seizures, and decrease brain swelling.  Airway protection and breathing support. This may include using a ventilator.  Treatments that monitor and manage swelling inside the brain.  Brain surgery. This may be needed to: ? Remove a collection of blood or blood clots. ? Stop the bleeding. ? Remove a part of the skull to allow room for the brain to swell. Follow these instructions at home: Activity  Rest and avoid activities that are physically hard or tiring.  Make sure you get enough sleep.  Let your brain rest by limiting activities that require a lot of thought or attention, such as: ? Watching TV. ? Playing memory games and puzzles. ? Job-related work or homework. ? Working on [employer], and texting.  Avoid activities that could cause another head injury, such as playing sports, until your health care provider approves. Having another head injury, especially before the first one has healed, can be dangerous.  Ask your health care provider when it is safe for you to return to your regular activities, including work  or school. Ask your health care provider for a step-by-step plan for gradually returning to activities.  Ask your health care provider when you can drive, ride a bicycle, or use heavy machinery. Your ability to react may be slower after a brain injury. Do not do these activities if you are dizzy. Lifestyle  Do not drink alcohol until your health care provider approves. Do not use drugs. Alcohol and certain drugs may slow your recovery and can put you at risk of further injury.  If it is harder than usual to remember things, write them down.  If you are easily distracted, try to do one thing at a time.  Talk with family members or close friends when making important decisions.  Tell your friends, family, a trusted colleague, and work [employer] about your injury, symptoms, and restrictions. Have them watch for any new or worsening problems.   General instructions  Take over-the-counter and prescription medicines only as told by your health care provider.  Have someone stay with you for 24 hours after your head injury. This person should watch you for any changes in your symptoms and be ready to seek medical help.  Keep all follow-up visits as told by your health care provider. This is important. How is this prevented?  Work on improving your balance and strength to avoid falls.  Wear a seat belt when you are in a moving vehicle.  Wear a helmet when riding a bicycle, skiing, or doing any other sport or activity that has a risk of injury.  If you drink alcohol: ? Limit how much you use to:  0-1 drink a day for nonpregnant women.  0-2 drinks a day for men. ?  Be aware of how much alcohol is in your drink. In the [place] one drink equals one 12 oz bottle of beer (355 mL), one 5 oz glass of wine (148 mL), or one 1 oz glass of hard liquor (44 mL).  Take safety measures in your home, such as: ? Removing clutter and tripping hazards from floors and stairways. ? Using grab bars in bathrooms  and handrails by stairs. ? Placing non-slip mats on floors and in bathtubs. ? Improving lighting in dim areas. Where to find more information  Centers for Disease Control and Prevention: http://www.wolf.info/ Get help right away if:  You have: ? A severe headache that is not helped by medicine. ? Trouble walking or weakness in your arms and legs. ? Clear or bloody fluid coming from your nose or ears. ? Changes in your vision. ? A seizure. ? Increased confusion or irritability.  Your symptoms get worse.  You are sleepier than normal and have trouble staying awake.  You lose your balance.  Your pupils change size.  Your speech is slurred.  Your dizziness gets worse.  You vomit. These symptoms may represent a serious problem that is an emergency. Do not wait to see if the symptoms will go away. Get medical help right away. Call your local emergency services (911 in the [place] Do not drive yourself to the hospital. Summary  Head injuries can be minor, or they can be a serious medical issue requiring immediate attention.  Treatment for this condition depends on the severity and type of injury you have.  Have someone stay with you for 24 hours after your injury and check on you often.  Ask your health care provider when it is safe for you to return to your regular activities, including work or school.  Head injury prevention includes wearing a seat belt in a motor vehicle, using a helmet on a bicycle, limiting alcohol use, and taking safety measures in your home. This information is not intended to replace advice given to you by your health care provider. Make sure you discuss any questions you have with your health care provider. Document Revised: 12/19/2018 Document Reviewed: 12/19/2018 Elsevier Patient Education  2021 [place], Adult  A concussion is a brain injury from a hard, direct hit (trauma) to your head or body. This direct hit causes your brain to quickly  shake back and forth inside your skull. A concussion may also be called a mild traumatic brain injury (TBI). Healing from this injury can take time. What are the causes? This condition is caused by:  A direct hit to your head, such as: ? Running into a player during a game. ? Being hit in a fight. ? Hitting your head on a hard surface.  A quick and sudden movement of the head or neck, such as in a car crash. What are the signs or symptoms? The signs of a concussion can be hard to notice. They may be missed by you, family members, and doctors. You may look fine on the outside but may not act or feel normal. Physical symptoms  Headaches.  Being dizzy.  Problems with body balance.  Being sensitive to light or noise.  Vomiting or feeling like you may vomit.  Being tired.  Problems seeing or hearing.  Not sleeping or eating as you used to.  Seizure. Mental and emotional symptoms  Feeling grouchy (irritable).  Having mood changes.  Problems remembering things.  Trouble focusing your mind (concentrating), organizing, or  making decisions.  Being slow to think, act, react, speak, or read.  Feeling worried or nervous (anxious).  Feeling sad (depressed). How is this treated? This condition may be treated by:  Stopping sports or activity if you are injured. If you hit your head or have signs of concussion: ? Do not return to sports or activities the same day. ? Get checked by a doctor before you return to your activities.  Resting your body and your mind.  Being watched carefully, often at home.  Medicines to help with symptoms such as: ? Headaches. ? Feeling like you may vomit. ? Problems with sleep.  Avoiding alcohol and drugs.  Being asked to go to a concussion clinic or a place to help you recover (rehabilitation center). Recovery from a concussion can take time. Return to activities only:  When you are fully healed.  When your doctor says it is  safe. Avoid taking strong pain medicines (opioids) for a concussion. Follow these instructions at home: Activity  Limit activities that need a lot of thought or focus, such as: ? Homework or work for your job. ? Watching TV. ? Using the computer or phone. ? Playing memory games and puzzles.  Rest. Rest helps your brain heal. Make sure you: ? Get plenty of sleep. Most adults should get 7-9 hours of sleep each night. ? Rest during the day. Take naps or breaks when you feel tired.  Avoid activity like exercise until your doctor says its safe. Stop any activity that makes symptoms worse.  Do not do activities that could cause a second concussion, such as riding a bike or playing sports.  Ask your doctor when you can return to your normal activities, such as school, work, sports, and driving. Your ability to react may be slower. Do not do these activities if you are dizzy. General instructions  Take over-the-counter and prescription medicines only as told by your doctor.  Do not drink alcohol until your doctor says you can.  Watch your symptoms and tell other people to do the same. Other problems can occur after a concussion. Older adults have a higher risk of serious problems.  Tell your work Freight forwarder, teachers, Government social research officer, school counselor, coach, or Product/process development scientist about your injury and symptoms. Tell them about what you can or cannot do.  Keep all follow-up visits as told by your doctor. This is important.   How is this prevented? It is very important that you do not get another brain injury. In rare cases, another injury can cause brain damage that will not go away, brain swelling, or death. The risk of this is greatest in the first 7-10 days after a head injury. To avoid injuries:  Stop activities that could lead to a second concussion, such as contact sports, until your doctor says it is okay.  When you return to sports or activities: ? Do not crash into other players. This  is how most concussions happen. ? Follow the rules. ? Respect other players. Do not engage in violent behavior while playing.  Get regular exercise. Do strength and balance training.  Wear a helmet that fits you well during sports, biking, or other activities.  Helmets can help protect you from serious skull and brain injuries, but they do not protect you from a concussion. Even when wearing a helmet, you should avoid being hit in the head. Contact a doctor if:  Your symptoms do not get better.  You have new symptoms.  You have  another injury. Get help right away if:  You have bad headaches or your headaches get worse.  You feel weak or numb in any part of your body.  You feel mixed up (confused).  Your balance gets worse.  You vomit often.  You feel more sleepy than normal.  You cannot speak well, or have slurred speech.  You have a seizure.  Others have trouble waking you up.  You have changes in how you act.  You have changes in how you see (vision).  You pass out (lose consciousness). These symptoms may be an emergency. Do not wait to see if the symptoms will go away. Get medical help right away. Call your local emergency services (911 in the [place] Do not drive yourself to the hospital. Summary  A concussion is a brain injury from a hard, direct hit (trauma) to your head or body.  This condition is treated with rest and careful watching of symptoms.  Ask your doctor when you can return to your normal activities, such as school, work, or driving.  Get help right away if you have a very bad headache, feel weak in any part of your body, have a seizure, have changes in how you act or see, or if you are mixed up or more sleepy than normal. This information is not intended to replace advice given to you by your health care provider. Make sure you discuss any questions you have with your health care provider. Document Revised: 12/18/2018 Document Reviewed:  12/18/2018 Elsevier Patient Education  2021 [place] for Gastroesophageal Reflux Disease, Adult When you have gastroesophageal reflux disease (GERD), the foods you eat and your eating habits are very important. Choosing the right foods can help ease the discomfort of GERD. Consider working with a dietitian to help you make healthy food choices. What are tips for following this plan? Reading food labels  Look for foods that are low in saturated fat. Foods that have less than 5% of daily value (DV) of fat and 0 g of trans fats may help with your symptoms. Cooking  Cook foods using methods other than frying. This may include baking, steaming, grilling, or broiling. These are all methods that do not need a lot of fat for cooking.  To add flavor, try to use herbs that are low in spice and acidity. Meal planning  Choose healthy foods that are low in fat, such as fruits, vegetables, whole grains, low-fat dairy products, lean meats, fish, and poultry.  Eat frequent, small meals instead of three large meals each day. Eat your meals slowly, in a relaxed setting. Avoid bending over or lying down until 2-3 hours after eating.  Limit high-fat foods such as fatty meats or fried foods.  Limit your intake of fatty foods, such as oils, butter, and shortening.  Avoid the following as told by your health care provider: ? Foods that cause symptoms. These may be different for different people. Keep a food diary to keep track of foods that cause symptoms. ? Alcohol. ? Drinking large amounts of liquid with meals. ? Eating meals during the 2-3 hours before bed.   Lifestyle  Maintain a healthy weight. Ask your health care provider what weight is healthy for you. If you need to lose weight, work with your health care provider to do so safely.  Exercise for at least 30 minutes on 5 or more days each week, or as told by your health care provider.  Avoid wearing clothes  that fit tightly  around your waist and chest.  Do not use any products that contain nicotine or tobacco. These products include cigarettes, chewing tobacco, and vaping devices, such as e-cigarettes. If you need help quitting, ask your health care provider.  Sleep with the head of your bed raised. Use a wedge under the mattress or blocks under the bed frame to raise the head of the bed.  Chew sugar-free gum after mealtimes. What foods should I eat? Eat a healthy, well-balanced diet of fruits, vegetables, whole grains, low-fat dairy products, lean meats, fish, and poultry. Each person is different. Foods that may trigger symptoms in one person may not trigger any symptoms in another person. Work with your health care provider to identify foods that are safe for you. The items listed above may not be a complete list of recommended foods and beverages. Contact a dietitian for more information.   What foods should I avoid? Limiting some of these foods may help manage the symptoms of GERD. Everyone is different. Consult a dietitian or your health care provider to help you identify the exact foods to avoid, if any. Fruits Any fruits prepared with added fat. Any fruits that cause symptoms. For some people this may include citrus fruits, such as oranges, grapefruit, pineapple, and lemons. Vegetables Deep-fried vegetables. [place] fries. Any vegetables prepared with added fat. Any vegetables that cause symptoms. For some people, this may include tomatoes and tomato products, chili peppers, onions and garlic, and horseradish. Grains Pastries or quick breads with added fat. Meats and other proteins High-fat meats, such as fatty beef or pork, hot dogs, ribs, ham, sausage, salami, and bacon. Fried meat or protein, including fried fish and fried chicken. Nuts and nut butters, in large amounts. Dairy Whole milk and chocolate milk. Sour cream. Cream. Ice cream. Cream cheese. Milkshakes. Fats and oils Butter. Margarine.  Shortening. Ghee. Beverages Coffee and tea, with or without caffeine. Carbonated beverages. Sodas. Energy drinks. Fruit juice made with acidic fruits, such as orange or grapefruit. Tomato juice. Alcoholic drinks. Sweets and desserts Chocolate and cocoa. Donuts. Seasonings and condiments Pepper. Peppermint and spearmint. Added salt. Any condiments, herbs, or seasonings that cause symptoms. For some people, this may include curry, hot sauce, or vinegar-based salad dressings. The items listed above may not be a complete list of foods and beverages to avoid. Contact a dietitian for more information. Questions to ask your health care provider Diet and lifestyle changes are usually the first steps that are taken to manage symptoms of GERD. If diet and lifestyle changes do not improve your symptoms, talk with your health care provider about taking medicines. Where to find more information  International Foundation for Gastrointestinal Disorders: aboutgerd.org Summary  When you have gastroesophageal reflux disease (GERD), food and lifestyle choices may be very helpful in easing the discomfort of GERD.  Eat frequent, small meals instead of three large meals each day. Eat your meals slowly, in a relaxed setting. Avoid bending over or lying down until 2-3 hours after eating.  Limit high-fat foods such as fatty meats or fried foods. This information is not intended to replace advice given to you by your health care provider. Make sure you discuss any questions you have with your health care provider. Document Revised: 08/17/2019 Document Reviewed: 08/17/2019 Elsevier Patient Education  2021 [place]. Debrox ear wax drops
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
## 2020-04-11 ENCOUNTER — Encounter: Payer: Self-pay | Admitting: Internal Medicine

## 2020-04-12 ENCOUNTER — Ambulatory Visit: Payer: Medicare HMO | Admitting: Internal Medicine

## 2020-04-28 ENCOUNTER — Encounter: Payer: Self-pay | Admitting: Internal Medicine

## 2020-04-28 ENCOUNTER — Other Ambulatory Visit: Payer: Self-pay

## 2020-04-28 ENCOUNTER — Ambulatory Visit (INDEPENDENT_AMBULATORY_CARE_PROVIDER_SITE_OTHER): Payer: Medicare HMO | Admitting: Internal Medicine

## 2020-04-28 VITALS — BP 118/78 | HR 93 | Temp 97.3°F | Ht 66.0 in | Wt 143.0 lb

## 2020-04-28 DIAGNOSIS — R269 Unspecified abnormalities of gait and mobility: Secondary | ICD-10-CM

## 2020-04-28 DIAGNOSIS — K2210 Ulcer of esophagus without bleeding: Secondary | ICD-10-CM | POA: Diagnosis not present

## 2020-04-28 DIAGNOSIS — Z0000 Encounter for general adult medical examination without abnormal findings: Secondary | ICD-10-CM | POA: Diagnosis not present

## 2020-04-28 DIAGNOSIS — K449 Diaphragmatic hernia without obstruction or gangrene: Secondary | ICD-10-CM | POA: Diagnosis not present

## 2020-04-28 DIAGNOSIS — K317 Polyp of stomach and duodenum: Secondary | ICD-10-CM

## 2020-04-28 DIAGNOSIS — R1310 Dysphagia, unspecified: Secondary | ICD-10-CM

## 2020-04-28 DIAGNOSIS — F419 Anxiety disorder, unspecified: Secondary | ICD-10-CM

## 2020-04-28 DIAGNOSIS — R69 Illness, unspecified: Secondary | ICD-10-CM | POA: Diagnosis not present

## 2020-04-28 DIAGNOSIS — K222 Esophageal obstruction: Secondary | ICD-10-CM | POA: Diagnosis not present

## 2020-04-28 DIAGNOSIS — M5030 Other cervical disc degeneration, unspecified cervical region: Secondary | ICD-10-CM

## 2020-04-28 DIAGNOSIS — F439 Reaction to severe stress, unspecified: Secondary | ICD-10-CM | POA: Insufficient documentation

## 2020-04-28 DIAGNOSIS — F339 Major depressive disorder, recurrent, unspecified: Secondary | ICD-10-CM

## 2020-04-28 HISTORY — DX: Reaction to severe stress, unspecified: F43.9

## 2020-04-28 HISTORY — DX: Other cervical disc degeneration, unspecified cervical region: M50.30

## 2020-04-28 HISTORY — DX: Anxiety disorder, unspecified: F41.9

## 2020-04-28 HISTORY — DX: Ulcer of esophagus without bleeding: K22.10

## 2020-04-28 NOTE — Patient Instructions (Addendum)
Palliative care for respite care or 24/7 care hiring   Call and schedule mammogram was due 04/13/20 [facility] [place]  [address]  [place] [address]  [phone] (in person, virtual or phonec call) Therapy and psychiatry are separate appointments  Only psychiatry can prescribe medication   [facility] should call you  Dr. Alice Reichert   Managing Anxiety, Adult After being diagnosed with an anxiety disorder, you may be relieved to know why you have felt or behaved a certain way. You may also feel overwhelmed about the treatment ahead and what it will mean for your life. With care and support, you can manage this condition and recover from it. How to manage lifestyle changes Managing stress and anxiety Stress is your body's reaction to life changes and events, both good and bad. Most stress will last just a few hours, but stress can be ongoing and can lead to more than just stress. Although stress can play a major role in anxiety, it is not the same as anxiety. Stress is usually caused by something external, such as a deadline, test, or competition. Stress normally passes after the triggering event has ended.  Anxiety is caused by something internal, such as imagining a terrible outcome or worrying that something will go wrong that will devastate you. Anxiety often does not go away even after the triggering event is over, and it can become long-term (chronic) worry. It is important to understand the differences between stress and anxiety and to manage your stress effectively so that it does not lead to an anxious response. Talk with your health care provider or a counselor to learn more about reducing anxiety and stress. He or she may suggest tension reduction techniques, such as:  Music therapy. This can include creating or listening to music that you enjoy and that inspires you.  Mindfulness-based meditation. This involves being  aware of your normal breaths while not trying to control your breathing. It can be done while sitting or walking.  Centering prayer. This involves focusing on a word, phrase, or sacred image that means something to you and brings you peace.  Deep breathing. To do this, expand your stomach and inhale slowly through your nose. Hold your breath for 3-5 seconds. Then exhale slowly, letting your stomach muscles relax.  Self-talk. This involves identifying thought patterns that lead to anxiety reactions and changing those patterns.  Muscle relaxation. This involves tensing muscles and then relaxing them. Choose a tension reduction technique that suits your lifestyle and personality. These techniques take time and practice. Set aside 5-15 minutes a day to do them. Therapists can offer counseling and training in these techniques. The training to help with anxiety may be covered by some insurance plans. Other things you can do to manage stress and anxiety include:  Keeping a stress/anxiety diary. This can help you learn what triggers your reaction and then learn ways to manage your response.  Thinking about how you react to certain situations. You may not be able to control everything, but you can control your response.  Making time for activities that help you relax and not feeling guilty about spending your time in this way.  Visual imagery and yoga can help you stay calm and relax.   Medicines Medicines can help ease symptoms. Medicines for anxiety include:  Anti-anxiety drugs.  Antidepressants. Medicines are often used as a primary treatment for anxiety disorder. Medicines will be prescribed by a health care  provider. When used together, medicines, psychotherapy, and tension reduction techniques may be the most effective treatment. Relationships Relationships can play a big part in helping you recover. Try to spend more time connecting with trusted friends and family members. Consider going to  couples counseling, taking family education classes, or going to family therapy. Therapy can help you and others better understand your condition. How to recognize changes in your anxiety Everyone responds differently to treatment for anxiety. Recovery from anxiety happens when symptoms decrease and stop interfering with your daily activities at home or work. This may mean that you will start to:  Have better concentration and focus. Worry will interfere less in your daily thinking.  Sleep better.  Be less irritable.  Have more energy.  Have improved memory. It is important to recognize when your condition is getting worse. Contact your health care provider if your symptoms interfere with home or work and you feel like your condition is not improving. Follow these instructions at home: Activity  Exercise. Most adults should do the following: ? Exercise for at least 150 minutes each week. The exercise should increase your heart rate and make you sweat (moderate-intensity exercise). ? Strengthening exercises at least twice a week.  Get the right amount and quality of sleep. Most adults need 7-9 hours of sleep each night. Lifestyle  Eat a healthy diet that includes plenty of vegetables, fruits, whole grains, low-fat dairy products, and lean protein. Do not eat a lot of foods that are high in solid fats, added sugars, or salt.  Make choices that simplify your life.  Do not use any products that contain nicotine or tobacco, such as cigarettes, e-cigarettes, and chewing tobacco. If you need help quitting, ask your health care provider.  Avoid caffeine, alcohol, and certain over-the-counter cold medicines. These may make you feel worse. Ask your pharmacist which medicines to avoid.   General instructions  Take over-the-counter and prescription medicines only as told by your health care provider.  Keep all follow-up visits as told by your health care provider. This is important. Where to  find support You can get help and support from these sources:  Self-help groups.  Online and [employer].  A trusted spiritual leader.  Couples counseling.  Family education classes.  Family therapy. Where to find more information You may find that joining a support group helps you deal with your anxiety. The following sources can help you locate counselors or support groups near you:  [place]: www.mentalhealthamerica.net  Anxiety and Depression Association of [place] (ADAA): https://www.clark.net/  National Alliance on Mental Illness (NAMI): www.nami.org Contact a health care provider if you:  Have a hard time staying focused or finishing daily tasks.  Spend many hours a day feeling worried about everyday life.  Become exhausted by worry.  Start to have headaches, feel tense, or have nausea.  Urinate more than normal.  Have diarrhea. Get help right away if you have:  A racing heart and shortness of breath.  Thoughts of hurting yourself or others. If you ever feel like you may hurt yourself or others, or have thoughts about taking your own life, get help right away. You can go to your nearest emergency department or call:  Your local emergency services (911 in the [place]  A suicide crisis helpline, such as the [place] at (239) 107-4047. This is open 24 hours a day. Summary  Taking steps to learn and use tension reduction techniques can help calm you and help  prevent triggering an anxiety reaction.  When used together, medicines, psychotherapy, and tension reduction techniques may be the most effective treatment.  Family, friends, and partners can play a big part in helping you recover from an anxiety disorder. This information is not intended to replace advice given to you by your health care provider. Make sure you discuss any questions you have with your health care provider. Document Revised: 07/08/2018 Document  Reviewed: 07/08/2018 Elsevier Patient Education  2021 [place] for Gastroesophageal Reflux Disease, Adult When you have gastroesophageal reflux disease (GERD), the foods you eat and your eating habits are very important. Choosing the right foods can help ease the discomfort of GERD. Consider working with a dietitian to help you make healthy food choices. What are tips for following this plan? Reading food labels  Look for foods that are low in saturated fat. Foods that have less than 5% of daily value (DV) of fat and 0 g of trans fats may help with your symptoms. Cooking  Cook foods using methods other than frying. This may include baking, steaming, grilling, or broiling. These are all methods that do not need a lot of fat for cooking.  To add flavor, try to use herbs that are low in spice and acidity. Meal planning  Choose healthy foods that are low in fat, such as fruits, vegetables, whole grains, low-fat dairy products, lean meats, fish, and poultry.  Eat frequent, small meals instead of three large meals each day. Eat your meals slowly, in a relaxed setting. Avoid bending over or lying down until 2-3 hours after eating.  Limit high-fat foods such as fatty meats or fried foods.  Limit your intake of fatty foods, such as oils, butter, and shortening.  Avoid the following as told by your health care provider: ? Foods that cause symptoms. These may be different for different people. Keep a food diary to keep track of foods that cause symptoms. ? Alcohol. ? Drinking large amounts of liquid with meals. ? Eating meals during the 2-3 hours before bed.   Lifestyle  Maintain a healthy weight. Ask your health care provider what weight is healthy for you. If you need to lose weight, work with your health care provider to do so safely.  Exercise for at least 30 minutes on 5 or more days each week, or as told by your health care provider.  Avoid wearing clothes that fit  tightly around your waist and chest.  Do not use any products that contain nicotine or tobacco. These products include cigarettes, chewing tobacco, and vaping devices, such as e-cigarettes. If you need help quitting, ask your health care provider.  Sleep with the head of your bed raised. Use a wedge under the mattress or blocks under the bed frame to raise the head of the bed.  Chew sugar-free gum after mealtimes. What foods should I eat? Eat a healthy, well-balanced diet of fruits, vegetables, whole grains, low-fat dairy products, lean meats, fish, and poultry. Each person is different. Foods that may trigger symptoms in one person may not trigger any symptoms in another person. Work with your health care provider to identify foods that are safe for you. The items listed above may not be a complete list of recommended foods and beverages. Contact a dietitian for more information.   What foods should I avoid? Limiting some of these foods may help manage the symptoms of GERD. Everyone is different. Consult a dietitian or your health care provider to  help you identify the exact foods to avoid, if any. Fruits Any fruits prepared with added fat. Any fruits that cause symptoms. For some people this may include citrus fruits, such as oranges, grapefruit, pineapple, and lemons. Vegetables Deep-fried vegetables. [place] fries. Any vegetables prepared with added fat. Any vegetables that cause symptoms. For some people, this may include tomatoes and tomato products, chili peppers, onions and garlic, and horseradish. Grains Pastries or quick breads with added fat. Meats and other proteins High-fat meats, such as fatty beef or pork, hot dogs, ribs, ham, sausage, salami, and bacon. Fried meat or protein, including fried fish and fried chicken. Nuts and nut butters, in large amounts. Dairy Whole milk and chocolate milk. Sour cream. Cream. Ice cream. Cream cheese. Milkshakes. Fats and oils Butter. Margarine.  Shortening. Ghee. Beverages Coffee and tea, with or without caffeine. Carbonated beverages. Sodas. Energy drinks. Fruit juice made with acidic fruits, such as orange or grapefruit. Tomato juice. Alcoholic drinks. Sweets and desserts Chocolate and cocoa. Donuts. Seasonings and condiments Pepper. Peppermint and spearmint. Added salt. Any condiments, herbs, or seasonings that cause symptoms. For some people, this may include curry, hot sauce, or vinegar-based salad dressings. The items listed above may not be a complete list of foods and beverages to avoid. Contact a dietitian for more information. Questions to ask your health care provider Diet and lifestyle changes are usually the first steps that are taken to manage symptoms of GERD. If diet and lifestyle changes do not improve your symptoms, talk with your health care provider about taking medicines. Where to find more information  International Foundation for Gastrointestinal Disorders: aboutgerd.org Summary  When you have gastroesophageal reflux disease (GERD), food and lifestyle choices may be very helpful in easing the discomfort of GERD.  Eat frequent, small meals instead of three large meals each day. Eat your meals slowly, in a relaxed setting. Avoid bending over or lying down until 2-3 hours after eating.  Limit high-fat foods such as fatty meats or fried foods. This information is not intended to replace advice given to you by your health care provider. Make sure you discuss any questions you have with your health care provider. Document Revised: 08/17/2019 Document Reviewed: 08/17/2019 Elsevier Patient Education  [place].

## 2020-04-28 NOTE — Progress Notes (Addendum)
Chief Complaint  Patient presents with  . Follow-up    3 wk f/u; slowly feeling like shes getting back to normal.    Annual   1. H/o fall 03/2020 but no falls since reviewed head and sinuses  IMPRESSION: 1. Small right frontal scalp hematoma. No acute intracranial abnormality seen. 2. Mild multilevel degenerative disc disease. No acute abnormality seen in the cervical spine. Electronically Signed   By: Marijo Conception M.D.   On: 04/07/2020 16:09 FINDINGS: CT HEAD FINDINGS  Brain: No evidence of acute infarction, hemorrhage, hydrocephalus, extra-axial collection or mass lesion/mass effect.  Vascular: No hyperdense vessel or unexpected calcification.  Skull: Normal. Negative for fracture or focal lesion.  Sinuses/Orbits: No acute finding.  Other: Small right frontal scalp hematoma is noted.  EXAM: CT ORBITS WITHOUT CONTRAST  TECHNIQUE: Multidetector CT images were obtained using the standard protocol without intravenous contrast.  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 Right globe is unremarkable. Extraocular muscles and orbital fat are grossly normal. Postsurgical changes from previous left orbital floor fracture repair. No acute traumatic or inflammatory finding.  Visualized sinuses: Postsurgical changes from partial left ethmoidectomy. Minimal polypoid mucosal thickening right maxillary sinus. Remaining sinuses are clear.  Soft tissues: Midline frontal scalp edema is noted consistent with hematoma. Remaining soft tissues are unremarkable.  Limited intracranial: No significant or unexpected finding.  IMPRESSION: 1. Minimal midline frontal scalp contusion. 2. No acute bony abnormalities. 3. Chronic postsurgical changes left orbit. Electronically Signed   By: Randa Ngo M.D.   On: 04/07/2020 16:07    2. C/o dysphagia back in 03/18/18 had esophagus dilated for schatzhi ring, gastric polyps,  erosive esophagitis, small hiatal hernia  When swallows she feels like swallowing a loaf of breat  3. 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 Her sister has a son in [place] who comes every other weekend but sister is here locally and lives 7 minutes away. Her sister (twin) has declining health with risk falls and vertigo and lives alone but she goes to help  -today we disc therapy and psychiatry    Review of Systems  Constitutional: Negative for weight loss.  HENT: Negative for hearing loss.   Eyes: Negative for blurred vision.  Respiratory: Negative for shortness of breath.   Cardiovascular: Negative for chest pain.  Gastrointestinal:       +dysphagia  Musculoskeletal:       No more falls since last visit   Skin: Negative for rash.  Neurological: Negative for headaches.  Psychiatric/Behavioral: Negative for depression.   Past Medical History:  Diagnosis Date  . Abdominal pain   . Abnormal CT of liver   . Anxiety   . Aortic stenosis   . Aortic valve disorders   . Arthritis   . Asthma   . Barrett's esophagus    [facility] last EGD 02/2018 Dr. Gustavo Lah   . Colon polyps   . COPD (chronic obstructive pulmonary disease) ([place])   . DDD (degenerative disc disease), cervical   . Depressive disorder   . Diverticulosis   . Erosive esophagitis   . Fundic gland polyps of stomach, benign   . Gastric polyp   . Gastric polyps   . Gastritis   . GERD (gastroesophageal reflux disease)   . Goiter   . Hiatal hernia    small   . Hiatal hernia   . Hypercholesteremia   .  Hypothyroidism   . Impaired fasting glucose   . Intrinsic asthma, unspecified   . Left rib fracture   . Obstructive sleep apnea (adult) (pediatric)    used cpap x 10 years per Dr. Mortimer Fries off as of 12/30/18   . OSA (obstructive sleep apnea)    used cpap x 10 years per Dr. Mortimer Fries off as of 12/30/18   . Osteoporosis   . Pneumonia   . Pure  hypercholesterolemia   . Schatzki's ring   . Schatzki's ring   . Shingles   . Syncope and collapse   . TIA (transient ischemic attack)   . UTI (urinary tract infection)   . Vitamin D deficiency    Past Surgical History:  Procedure Laterality Date  . BREAST EXCISIONAL BIOPSY Left   . COLONOSCOPY    . COLONOSCOPY WITH ESOPHAGOGASTRODUODENOSCOPY (EGD)    . COLONOSCOPY WITH PROPOFOL N/A 12/14/2014   Procedure: COLONOSCOPY WITH PROPOFOL;  Surgeon: Lollie Sails, MD;  Location: [facility] ENDOSCOPY;  Service: Endoscopy;  Laterality: N/A;  . COLONOSCOPY WITH PROPOFOL N/A 03/18/2018   Procedure: COLONOSCOPY WITH PROPOFOL;  Surgeon: Lollie Sails, MD;  Location: [facility] ENDOSCOPY;  Service: Endoscopy;  Laterality: N/A;  . ENUCLEATION    . ESOPHAGOGASTRODUODENOSCOPY (EGD) WITH PROPOFOL N/A 12/14/2014   Procedure: ESOPHAGOGASTRODUODENOSCOPY (EGD) WITH PROPOFOL;  Surgeon: Lollie Sails, MD;  Location: [facility] ENDOSCOPY;  Service: Endoscopy;  Laterality: N/A;  . ESOPHAGOGASTRODUODENOSCOPY (EGD) WITH PROPOFOL N/A 10/18/2016   Procedure: ESOPHAGOGASTRODUODENOSCOPY (EGD) WITH PROPOFOL;  Surgeon: Lollie Sails, MD;  Location: [facility] ENDOSCOPY;  Service: Endoscopy;  Laterality: N/A;  . ESOPHAGOGASTRODUODENOSCOPY (EGD) WITH PROPOFOL N/A 03/18/2018   Procedure: ESOPHAGOGASTRODUODENOSCOPY (EGD) WITH PROPOFOL;  Surgeon: Lollie Sails, MD;  Location: [facility] ENDOSCOPY;  Service: Endoscopy;  Laterality: N/A;  . EYE SURGERY    . HIP SURGERY Left    pin and plate  . IR ANGIO INTRA EXTRACRAN SEL COM CAROTID INNOMINATE BILAT MOD SED  10/17/2018  . IR ANGIO VERTEBRAL SEL VERTEBRAL BILAT MOD SED  10/17/2018  . IR ANGIOGRAM EXTREMITY LEFT  10/17/2018  . Left Total Knee Arthroplasty     Dr Marry Guan and also had 39 years ago from 2020   . nasal polyps     removed x 2 affects sense of smell x 10-15 years from 04/28/20  . prosthesis eye implant    . REPLACEMENT UNICONDYLAR JOINT KNEE Left   . TONSILLECTOMY    .  TOTAL KNEE REVISION Left 09/13/2017   Procedure: TOTAL KNEE REVISION;  Surgeon: Dereck Leep, MD;  Location: [facility] ORS;  Service: Orthopedics;  Laterality: Left;   Family History  Problem Relation Age of Onset  . Heart disease Father   . Alcohol abuse Father   . Breast cancer Mother   . Bipolar disorder Brother   . Dementia Sister        twin sister 25 dx'ed dementia   Social History   Socioeconomic History  . Marital status: Divorced    Spouse name: Not on file  . Number of children: Not on file  . Years of education: Not on file  . Highest education level: Not on file  Occupational History  . Not on file  Tobacco Use  . Smoking status: Never Smoker  . Smokeless tobacco: Never Used  Vaping Use  . Vaping Use: Never used  Substance and Sexual Activity  . Alcohol use: Yes    Alcohol/week: 1.0 standard drink    Types: 1 Glasses of wine per  week    Comment: occas. wine  . Drug use: No  . Sexual activity: Never  Other Topics Concern  . Not on file  Social History Narrative   Has twin sister identical lives in [place] does not live with her    Lived in [place], moved to [place] two years ago.   Divorced.      Has a living will.   Would desire CPR, would not desire extraordinary measures.      1 boy and 1 girl daughter [place], son In [place] Manufacturing engineer   Social Determinants of Radio broadcast assistant Strain: Not on [employer] Insecurity: Not on file  Transportation Needs: Not on file  Physical Activity: Not on file  Stress: Not on file  Social Connections: Not on file  Intimate Partner Violence: Not on file   Current Meds  Medication Sig  . acetaminophen (TYLENOL) 500 MG tablet Take 500 mg by mouth every 6 (six) hours as needed (PAIN).  Marland Kitchen albuterol (VENTOLIN HFA) 108 (90 Base) MCG/ACT inhaler Inhale 2 puffs into the lungs every 6 (six) hours as needed for wheezing.  Marland Kitchen aspirin 325 MG tablet Take 325 mg by mouth daily.   . Azelastine HCl  0.15 % SOLN Place 1 spray into the nose 2 (two) times daily as needed.  . budesonide-formoterol (SYMBICORT) 160-4.5 MCG/ACT inhaler Inhale 2 puffs into the lungs 2 (two) times daily.  . calcium carbonate (TUMS - DOSED IN MG ELEMENTAL CALCIUM) 500 MG chewable tablet Chew 1 tablet by mouth 4 (four) times daily as needed for indigestion or heartburn.  . diclofenac sodium (VOLTAREN) 1 % GEL Apply 4 g topically 4 (four) times daily. (Patient taking differently: Apply 4 g topically 4 (four) times daily as needed (for pain).)  . famotidine (PEPCID) 20 MG tablet Take 20 mg by mouth daily.  . fluticasone (FLONASE) 50 MCG/ACT nasal spray Place into both nostrils daily.  . hydrocortisone 2.5 % cream Apply topically 2 (two) times daily. Prn right lower chin  . latanoprost (XALATAN) 0.005 % ophthalmic solution   . levothyroxine (SYNTHROID) 50 MCG tablet TAKE 1 TABLET BY MOUTH EVERY DAY BEFORE BREAKFAST  . loratadine (CLARITIN) 10 MG tablet Take 1 tablet (10 mg total) by mouth daily as needed for allergies.  . mupirocin ointment (BACTROBAN) 2 % Apply 1 application topically 2 (two) times daily. Forehead wound  . PARoxetine (PAXIL) 30 MG tablet TAKE 1 TABLET BY MOUTH EVERY MORNING  . Polyvinyl Alcohol-Povidone PF 1.4-0.6 % SOLN Place 2 drops into both eyes 2 (two) times daily as needed (for dry eyes).  . RABEprazole (ACIPHEX) 20 MG tablet   . simvastatin (ZOCOR) 20 MG tablet TAKE 1 TABLET BY MOUTH EVERYDAY AT BEDTIME   Allergies  Allergen Reactions  . Amoxicillin Other (See Comments)    Pencillins  . Cefzil [Cefprozil] Other (See Comments)    Unknown  . Cephalexin Other (See Comments)    Unknown  . Propoxyphene Other (See Comments)    Unknown  . Relafen [Nabumetone] Other (See Comments)    Unknown  . Talwin [Pentazocine] Other (See Comments)    Unknown  . Penicillins Other (See Comments)    Cannot remember reaction   No results found for this or any previous visit (from the past 2160  hour(s)). Objective  Body mass index is 23.08 kg/m. Wt Readings from Last 3 Encounters:  04/28/20 143 lb (64.9 kg)  04/07/20 142 lb (64.4 kg)  03/10/20  143 lb 6.4 oz (65 kg)   Temp Readings from Last 3 Encounters:  04/28/20 (!) 97.3 F (36.3 C)  04/07/20 (!) 97.5 F (36.4 C)  03/10/20 (!) 97.1 F (36.2 C) (Temporal)   BP Readings from Last 3 Encounters:  04/28/20 118/78  04/07/20 122/82  03/10/20 130/80   Pulse Readings from Last 3 Encounters:  04/28/20 93  04/07/20 90  03/10/20 98    Physical Exam Vitals and nursing note reviewed.  Constitutional:      Appearance: Normal appearance. She is well-developed, well-groomed and normal weight.  HENT:     Head: Normocephalic and atraumatic.     Left Ear: There is impacted cerumen.  Eyes:     Pupils: Pupils are equal, round, and reactive to light.     Comments: Left eye is prosthetic    Cardiovascular:     Rate and Rhythm: Normal rate and regular rhythm.     Heart sounds: Normal heart sounds. No murmur heard.   Pulmonary:     Effort: Pulmonary effort is normal.     Breath sounds: Normal breath sounds.  Abdominal:     Tenderness: There is no abdominal tenderness.  Skin:    General: Skin is warm and dry.  Neurological:     General: No focal deficit present.     Mental Status: She is alert and oriented to person, place, and time. Mental status is at baseline.     Gait: Gait normal.  Psychiatric:        Attention and Perception: Attention and perception normal.        Mood and Affect: Mood and affect normal.        Speech: Speech normal.        Behavior: Behavior normal. Behavior is cooperative.        Thought Content: Thought content normal.        Cognition and Memory: Cognition and memory normal.        Judgment: Judgment normal.     Assessment  Plan  Annual physical exam - Plan: nonfasting labs Lipid panel, Comprehensive metabolic panel, CBC with Differential/Platelet, TSH, Thyroid peroxidase antibody,  Vitamin D (25 hydroxy), Vitamin B12 utd flu shot  prevnar utd  pna 25 had 12/19/18  shingrix had 2/2 covid vx 3/3 utd  Consider Tdap in futuretd had 04/17/16  EGD/colonoscopy had 02/2018 [facility] with erosive gastritis, small HH, gastric polpys, schzatski ring, diverticulosis, 3 mm colon polyp and IH Esophagus was dilated  -referred 04/28/20 Dr. Alice Reichert  04/14/19 negative mammogram, ordered pt to call and schedule  DEXA h/o osteoporosis/peniaon prolia since 2014 T score -2.3 from -2.4 and still right femur with -2.6 [facility] 09/22/19 -->had reclast 12/03/19 [facility] will get yearly  Never smoker   rec healthy diet and exercise   Gastric polyps Hiatal hernia small H/o erosive esohagitis Dysphagia, unspecified type - Plan: Ambulatory referral to Gastroenterology Lower esophageal ring (Schatzki) - Plan: Ambulatory referral to Gastroenterology H/o dilated esophagus due to ring in 02/2018  DDD (degenerative disc disease), cervical, falls Ordered PT for [facility]   Depression, recurrent ([place]) Anxiety Stress  On paxil 30 mg x 30 years  [facility] [place]  [phone]  [place] [address]  [phone] (in person, virtual or phonec call) Therapy and psychiatry are separate appointments  Only psychiatry can prescribe medication   Consider palliative care or respite care to get help with her sister as she is caretaker  Provider: Dr. Olivia Mackie McLean-Scocuzza-Internal Medicine

## 2020-04-29 LAB — COMPREHENSIVE METABOLIC PANEL
ALT: 10 U/L (ref 0–35)
AST: 14 U/L (ref 0–37)
Albumin: 4.1 g/dL (ref 3.5–5.2)
Alkaline Phosphatase: 55 U/L (ref 39–117)
BUN: 16 mg/dL (ref 6–23)
CO2: 31 mEq/L (ref 19–32)
Calcium: 9.4 mg/dL (ref 8.4–10.5)
Chloride: 102 mEq/L (ref 96–112)
Creatinine, Ser: 1.01 mg/dL (ref 0.40–1.20)
GFR: 51.88 mL/min — ABNORMAL LOW (ref >60.00)
Glucose, Bld: 82 mg/dL (ref 70–99)
Potassium: 3.9 mEq/L (ref 3.5–5.1)
Sodium: 141 mEq/L (ref 135–145)
Total Bilirubin: 0.5 mg/dL (ref 0.2–1.2)
Total Protein: 6.4 g/dL (ref 6.0–8.3)

## 2020-04-29 LAB — THYROID PEROXIDASE ANTIBODY: Thyroperoxidase Ab SerPl-aCnc: 7 IU/mL (ref <9)

## 2020-04-29 LAB — TSH: TSH: 1.87 u[IU]/mL (ref 0.35–4.50)

## 2020-04-29 LAB — CBC WITH DIFFERENTIAL/PLATELET
Basophils Absolute: 0 10*3/uL (ref 0.0–0.1)
Basophils Relative: 0.6 % (ref 0.0–3.0)
Eosinophils Absolute: 0.4 10*3/uL (ref 0.0–0.7)
Eosinophils Relative: 7.2 % — ABNORMAL HIGH (ref 0.0–5.0)
HCT: 40.2 % (ref 36.0–46.0)
Hemoglobin: 13.8 g/dL (ref 12.0–15.0)
Lymphocytes Relative: 22.3 % (ref 12.0–46.0)
Lymphs Abs: 1.2 10*3/uL (ref 0.7–4.0)
MCHC: 34.3 g/dL (ref 30.0–36.0)
MCV: 88.3 fl (ref 78.0–100.0)
Monocytes Absolute: 0.5 10*3/uL (ref 0.1–1.0)
Monocytes Relative: 9.6 % (ref 3.0–12.0)
Neutro Abs: 3.2 10*3/uL (ref 1.4–7.7)
Neutrophils Relative %: 60.3 % (ref 43.0–77.0)
Platelets: 222 10*3/uL (ref 150.0–400.0)
RBC: 4.55 Mil/uL (ref 3.87–5.11)
RDW: 13.1 % (ref 11.5–15.5)
WBC: 5.3 10*3/uL (ref 4.0–10.5)

## 2020-04-29 LAB — VITAMIN D 25 HYDROXY (VIT D DEFICIENCY, FRACTURES): VITD: 36.39 ng/mL (ref 30.00–100.00)

## 2020-04-29 LAB — LIPID PANEL
Cholesterol: 137 mg/dL (ref 0–200)
HDL: 52.4 mg/dL (ref >39.00)
LDL Cholesterol: 66 mg/dL (ref 0–99)
NonHDL: 84.7
Total CHOL/HDL Ratio: 3
Triglycerides: 95 mg/dL (ref 0.0–149.0)
VLDL: 19 mg/dL (ref 0.0–40.0)

## 2020-04-29 LAB — VITAMIN B12: Vitamin B-12: 255 pg/mL (ref 211–911)

## 2020-05-20 ENCOUNTER — Other Ambulatory Visit: Payer: Self-pay

## 2020-05-20 ENCOUNTER — Encounter: Payer: Self-pay | Admitting: Internal Medicine

## 2020-05-20 ENCOUNTER — Ambulatory Visit
Admission: RE | Admit: 2020-05-20 | Discharge: 2020-05-20 | Disposition: A | Discharge status: Home / Self Care | Payer: Medicare HMO | Source: Ambulatory Visit | Attending: Internal Medicine | Admitting: Internal Medicine

## 2020-05-20 DIAGNOSIS — Z1231 Encounter for screening mammogram for malignant neoplasm of breast: Secondary | ICD-10-CM | POA: Diagnosis not present

## 2020-05-20 NOTE — Telephone Encounter (Signed)
Sent to fax.

## 2020-05-24 NOTE — Telephone Encounter (Signed)
Faxed rx order for PT 2-3 times a week for 6-8 weeks to [facility] @ [phone] on 05/23/20.  Dx code M50.30

## 2020-05-27 ENCOUNTER — Other Ambulatory Visit: Payer: Self-pay

## 2020-05-27 ENCOUNTER — Ambulatory Visit (INDEPENDENT_AMBULATORY_CARE_PROVIDER_SITE_OTHER): Payer: Medicare HMO

## 2020-05-27 VITALS — BP 118/78 | HR 93 | Resp 14 | Ht 66.0 in | Wt 142.8 lb

## 2020-05-27 DIAGNOSIS — Z0000 Encounter for general adult medical examination without abnormal findings: Secondary | ICD-10-CM | POA: Diagnosis not present

## 2020-05-27 NOTE — Patient Instructions (Addendum)
Dorothy Church , Thank you for taking time to come for your Medicare Wellness Visit. I appreciate your ongoing commitment to your health goals. Please review the following plan we discussed and let me know if I can assist you in the future.   These are the goals we discussed: Goals      Patient Stated   .  Maintain Healthy Lifestyle (pt-stated)      Stay active Healthy diet Stay hydrated       This is a list of the screening recommended for you and due dates:  Health Maintenance  Topic Date Due  . Flu Shot  09/19/2020  . Tetanus Vaccine  04/17/2026  . DEXA scan (bone density measurement)  Completed  . COVID-19 Vaccine  Completed  . Pneumonia vaccines  Completed  . HPV Vaccine  Aged Out    Immunizations Immunization History  Administered Date(s) Administered  . Fluad Quad(high Dose 65+) 12/05/2019  . Influenza Whole 12/14/2010  . Influenza, High Dose Seasonal PF 12/02/2017, 12/08/2018  . Influenza,inj,Quad PF,6+ Mos 11/10/2012, 11/05/2013, 11/10/2014, 10/17/2015, 10/24/2016  . Influenza-Unspecified 12/04/2011, 11/10/2012, 11/05/2013, 12/07/2018  . Moderna Sars-Covid-2 Vaccination 03/03/2019, 03/31/2019, 11/30/2019  . Pneumococcal Conjugate-13 10/07/2009, 04/23/2013, 11/24/2013  . Pneumococcal Polysaccharide-23 11/13/2009, 12/19/2018  . Td 04/17/2016  . Zoster 05/08/2007  . Zoster Recombinat (Shingrix) 04/09/2018, 09/15/2018   Keep all routine maintenance appointments.   Advanced directives: on file  Conditions/risks identified: none new.  Follow up in one year for your annual wellness visit.   Preventive Care 49 Years and Older, Female Preventive care refers to lifestyle choices and visits with your health care provider that can promote health and wellness. What does preventive care include?  A yearly physical exam. This is also called an annual well check.  Dental exams once or twice a year.  Routine eye exams. Ask your health care provider how often you should  have your eyes checked.  Personal lifestyle choices, including:  Daily care of your teeth and gums.  Regular physical activity.  Eating a healthy diet.  Avoiding tobacco and drug use.  Limiting alcohol use.  Practicing safe sex.  Taking low-dose aspirin every day.  Taking vitamin and mineral supplements as recommended by your health care provider. What happens during an annual well check? The services and screenings done by your health care provider during your annual well check will depend on your age, overall health, lifestyle risk factors, and family history of disease. Counseling  Your health care provider may ask you questions about your:  Alcohol use.  Tobacco use.  Drug use.  Emotional well-being.  Home and relationship well-being.  Sexual activity.  Eating habits.  History of falls.  Memory and ability to understand (cognition).  Work and work Statistician.  Reproductive health. Screening  You may have the following tests or measurements:  Height, weight, and BMI.  Blood pressure.  Lipid and cholesterol levels. These may be checked every 5 years, or more frequently if you are over 67 years old.  Skin check.  Lung cancer screening. You may have this screening every year starting at age 20 if you have a 30-pack-year history of smoking and currently smoke or have quit within the past 15 years.  Fecal occult blood test (FOBT) of the stool. You may have this test every year starting at age 98.  Flexible sigmoidoscopy or colonoscopy. You may have a sigmoidoscopy every 5 years or a colonoscopy every 10 years starting at age 23.  Hepatitis C blood test.  Hepatitis B blood test.  Sexually transmitted disease (STD) testing.  Diabetes screening. This is done by checking your blood sugar (glucose) after you have not eaten for a while (fasting). You may have this done every 1-3 years.  Bone density scan. This is done to screen for osteoporosis. You may  have this done starting at age 23.  Mammogram. This may be done every 1-2 years. Talk to your health care provider about how often you should have regular mammograms. Talk with your health care provider about your test results, treatment options, and if necessary, the need for more tests. Vaccines  Your health care provider may recommend certain vaccines, such as:  Influenza vaccine. This is recommended every year.  Tetanus, diphtheria, and acellular pertussis (Tdap, Td) vaccine. You may need a Td booster every 10 years.  Zoster vaccine. You may need this after age 51.  Pneumococcal 13-valent conjugate (PCV13) vaccine. One dose is recommended after age 64.  Pneumococcal polysaccharide (PPSV23) vaccine. One dose is recommended after age 73. Talk to your health care provider about which screenings and vaccines you need and how often you need them. This information is not intended to replace advice given to you by your health care provider. Make sure you discuss any questions you have with your health care provider. Document Released: 03/04/2015 Document Revised: 10/26/2015 Document Reviewed: 12/07/2014 Elsevier Interactive Patient Education  2017 [place] Prevention in the Home Falls can cause injuries. They can happen to people of all ages. There are many things you can do to make your home safe and to help prevent falls. What can I do on the outside of my home?  Regularly fix the edges of walkways and driveways and fix any cracks.  Remove anything that might make you trip as you walk through a door, such as a raised step or threshold.  Trim any bushes or trees on the path to your home.  Use bright outdoor lighting.  Clear any walking paths of anything that might make someone trip, such as rocks or tools.  Regularly check to see if handrails are loose or broken. Make sure that both sides of any steps have handrails.  Any raised decks and porches should have guardrails  on the edges.  Have any leaves, snow, or ice cleared regularly.  Use sand or salt on walking paths during winter.  Clean up any spills in your garage right away. This includes oil or grease spills. What can I do in the bathroom?  Use night lights.  Install grab bars by the toilet and in the tub and shower. Do not use towel bars as grab bars.  Use non-skid mats or decals in the tub or shower.  If you need to sit down in the shower, use a plastic, non-slip stool.  Keep the floor dry. Clean up any water that spills on the floor as soon as it happens.  Remove soap buildup in the tub or shower regularly.  Attach bath mats securely with double-sided non-slip rug tape.  Do not have throw rugs and other things on the floor that can make you trip. What can I do in the bedroom?  Use night lights.  Make sure that you have a light by your bed that is easy to reach.  Do not use any sheets or blankets that are too big for your bed. They should not hang down onto the floor.  Have a firm chair that has side arms. You can use this  for support while you get dressed.  Do not have throw rugs and other things on the floor that can make you trip. What can I do in the kitchen?  Clean up any spills right away.  Avoid walking on wet floors.  Keep items that you use a lot in easy-to-reach places.  If you need to reach something above you, use a strong step stool that has a grab bar.  Keep electrical cords out of the way.  Do not use floor polish or wax that makes floors slippery. If you must use wax, use non-skid floor wax.  Do not have throw rugs and other things on the floor that can make you trip. What can I do with my stairs?  Do not leave any items on the stairs.  Make sure that there are handrails on both sides of the stairs and use them. Fix handrails that are broken or loose. Make sure that handrails are as long as the stairways.  Check any carpeting to make sure that it is  firmly attached to the stairs. Fix any carpet that is loose or worn.  Avoid having throw rugs at the top or bottom of the stairs. If you do have throw rugs, attach them to the floor with carpet tape.  Make sure that you have a light switch at the top of the stairs and the bottom of the stairs. If you do not have them, ask someone to add them for you. What else can I do to help prevent falls?  Wear shoes that:  Do not have high heels.  Have rubber bottoms.  Are comfortable and fit you well.  Are closed at the toe. Do not wear sandals.  If you use a stepladder:  Make sure that it is fully opened. Do not climb a closed stepladder.  Make sure that both sides of the stepladder are locked into place.  Ask someone to hold it for you, if possible.  Clearly mark and make sure that you can see:  Any grab bars or handrails.  First and last steps.  Where the edge of each step is.  Use tools that help you move around (mobility aids) if they are needed. These include:  Canes.  Walkers.  Scooters.  Crutches.  Turn on the lights when you go into a dark area. Replace any light bulbs as soon as they burn out.  Set up your furniture so you have a clear path. Avoid moving your furniture around.  If any of your floors are uneven, fix them.  If there are any pets around you, be aware of where they are.  Review your medicines with your doctor. Some medicines can make you feel dizzy. This can increase your chance of falling. Ask your doctor what other things that you can do to help prevent falls. This information is not intended to replace advice given to you by your health care provider. Make sure you discuss any questions you have with your health care provider. Document Released: 12/02/2008 Document Revised: 07/14/2015 Document Reviewed: 03/12/2014 Elsevier Interactive Patient Education  2017 [employer]

## 2020-05-27 NOTE — Progress Notes (Signed)
Subjective:   Dorothy Church is a 85 y.o. female who presents for Medicare Annual (Subsequent) preventive examination.  Review of Systems    No ROS.  Medicare Wellness Virtual Visit.   Cardiac Risk Factors include: advanced age (>77men, >8 women)     Objective:    Today's Vitals   05/27/20 1108  BP: 118/78  Pulse: 93  Resp: 14  SpO2: 95%  Weight: 142 lb 12.8 oz (64.8 kg)  Height: 5\' 6"  (1.676 m)   Body mass index is 23.05 kg/m.  Advanced Directives 05/27/2020 05/27/2019 10/17/2018 04/28/2018 03/18/2018 09/13/2017 09/13/2017  Does Patient Have a Medical Advance Directive? Yes Yes Yes Yes Yes Yes Yes  Type of Paramedic of [place];Living will [place];Living will [place];Living will [place];Living will [place];Living will [place];Living will [place];Living will  Does patient want to make changes to medical advance directive? No - Patient declined No - Patient declined No - Patient declined - - No - Patient declined No - Patient declined  Copy of [place] in Chart? Yes - validated most recent copy scanned in chart (See row information) No - copy requested No - copy requested No - copy requested Yes - validated most recent copy scanned in chart (See row information) No - copy requested No - copy requested  Would patient like information on creating a medical advance directive? - - - - - - -    Current Medications (verified) Outpatient Encounter Medications as of 05/27/2020  Medication Sig  . acetaminophen (TYLENOL) 500 MG tablet Take 500 mg by mouth every 6 (six) hours as needed (PAIN).  Marland Kitchen albuterol (VENTOLIN HFA) 108 (90 Base) MCG/ACT inhaler Inhale 2 puffs into the lungs every 6 (six) hours as needed for wheezing.  Marland Kitchen aspirin 325 MG tablet Take 325 mg by mouth daily.   . Azelastine HCl 0.15 % SOLN Place 1 spray into the  nose 2 (two) times daily as needed.  . budesonide-formoterol (SYMBICORT) 160-4.5 MCG/ACT inhaler Inhale 2 puffs into the lungs 2 (two) times daily.  . calcium carbonate (TUMS - DOSED IN MG ELEMENTAL CALCIUM) 500 MG chewable tablet Chew 1 tablet by mouth 4 (four) times daily as needed for indigestion or heartburn.  . diclofenac sodium (VOLTAREN) 1 % GEL Apply 4 g topically 4 (four) times daily. (Patient taking differently: Apply 4 g topically 4 (four) times daily as needed (for pain).)  . famotidine (PEPCID) 20 MG tablet Take 20 mg by mouth daily.  . fluticasone (FLONASE) 50 MCG/ACT nasal spray Place into both nostrils daily.  . hydrocortisone 2.5 % cream Apply topically 2 (two) times daily. Prn right lower chin  . latanoprost (XALATAN) 0.005 % ophthalmic solution   . levothyroxine (SYNTHROID) 50 MCG tablet TAKE 1 TABLET BY MOUTH EVERY DAY BEFORE BREAKFAST  . loratadine (CLARITIN) 10 MG tablet Take 1 tablet (10 mg total) by mouth daily as needed for allergies.  . mupirocin ointment (BACTROBAN) 2 % Apply 1 application topically 2 (two) times daily. Forehead wound  . PARoxetine (PAXIL) 30 MG tablet TAKE 1 TABLET BY MOUTH EVERY MORNING  . Polyvinyl Alcohol-Povidone PF 1.4-0.6 % SOLN Place 2 drops into both eyes 2 (two) times daily as needed (for dry eyes).  . RABEprazole (ACIPHEX) 20 MG tablet   . simvastatin (ZOCOR) 20 MG tablet TAKE 1 TABLET BY MOUTH EVERYDAY AT BEDTIME   No facility-administered encounter  medications on file as of 05/27/2020.    Allergies (verified) Amoxicillin, Cefzil [cefprozil], Cephalexin, Propoxyphene, Relafen [nabumetone], Talwin [pentazocine], and Penicillins   History: Past Medical History:  Diagnosis Date  . Abdominal pain   . Abnormal CT of liver   . Anxiety   . Aortic stenosis   . Aortic valve disorders   . Arthritis   . Asthma   . Barrett's esophagus    [facility] last EGD 02/2018 Dr. Gustavo Lah   . Colon polyps   . COPD (chronic obstructive pulmonary disease)  ([place])   . DDD (degenerative disc disease), cervical   . Depressive disorder   . Diverticulosis   . Erosive esophagitis   . Fundic gland polyps of stomach, benign   . Gastric polyp   . Gastric polyps   . Gastritis   . GERD (gastroesophageal reflux disease)   . Goiter   . Hiatal hernia    small   . Hiatal hernia   . Hypercholesteremia   . Hypothyroidism   . Impaired fasting glucose   . Intrinsic asthma, unspecified   . Left rib fracture   . Obstructive sleep apnea (adult) (pediatric)    used cpap x 10 years per Dr. Mortimer Fries off as of 12/30/18   . OSA (obstructive sleep apnea)    used cpap x 10 years per Dr. Mortimer Fries off as of 12/30/18   . Osteoporosis   . Pneumonia   . Pure hypercholesterolemia   . Schatzki's ring   . Schatzki's ring   . Shingles   . Syncope and collapse   . TIA (transient ischemic attack)   . UTI (urinary tract infection)   . Vitamin D deficiency    Past Surgical History:  Procedure Laterality Date  . BREAST EXCISIONAL BIOPSY Left   . COLONOSCOPY    . COLONOSCOPY WITH ESOPHAGOGASTRODUODENOSCOPY (EGD)    . COLONOSCOPY WITH PROPOFOL N/A 12/14/2014   Procedure: COLONOSCOPY WITH PROPOFOL;  Surgeon: Lollie Sails, MD;  Location: [facility] ENDOSCOPY;  Service: Endoscopy;  Laterality: N/A;  . COLONOSCOPY WITH PROPOFOL N/A 03/18/2018   Procedure: COLONOSCOPY WITH PROPOFOL;  Surgeon: Lollie Sails, MD;  Location: [facility] ENDOSCOPY;  Service: Endoscopy;  Laterality: N/A;  . ENUCLEATION    . ESOPHAGOGASTRODUODENOSCOPY (EGD) WITH PROPOFOL N/A 12/14/2014   Procedure: ESOPHAGOGASTRODUODENOSCOPY (EGD) WITH PROPOFOL;  Surgeon: Lollie Sails, MD;  Location: [facility] ENDOSCOPY;  Service: Endoscopy;  Laterality: N/A;  . ESOPHAGOGASTRODUODENOSCOPY (EGD) WITH PROPOFOL N/A 10/18/2016   Procedure: ESOPHAGOGASTRODUODENOSCOPY (EGD) WITH PROPOFOL;  Surgeon: Lollie Sails, MD;  Location: [facility] ENDOSCOPY;  Service: Endoscopy;  Laterality: N/A;  . ESOPHAGOGASTRODUODENOSCOPY (EGD) WITH  PROPOFOL N/A 03/18/2018   Procedure: ESOPHAGOGASTRODUODENOSCOPY (EGD) WITH PROPOFOL;  Surgeon: Lollie Sails, MD;  Location: [facility] ENDOSCOPY;  Service: Endoscopy;  Laterality: N/A;  . EYE SURGERY    . HIP SURGERY Left    pin and plate  . IR ANGIO INTRA EXTRACRAN SEL COM CAROTID INNOMINATE BILAT MOD SED  10/17/2018  . IR ANGIO VERTEBRAL SEL VERTEBRAL BILAT MOD SED  10/17/2018  . IR ANGIOGRAM EXTREMITY LEFT  10/17/2018  . Left Total Knee Arthroplasty     Dr Marry Guan and also had 39 years ago from 2020   . nasal polyps     removed x 2 affects sense of smell x 10-15 years from 04/28/20  . prosthesis eye implant    . REPLACEMENT UNICONDYLAR JOINT KNEE Left   . TONSILLECTOMY    . TOTAL KNEE REVISION Left 09/13/2017   Procedure: TOTAL  KNEE REVISION;  Surgeon: Dereck Leep, MD;  Location: [facility] ORS;  Service: Orthopedics;  Laterality: Left;   Family History  Problem Relation Age of Onset  . Heart disease Father   . Alcohol abuse Father   . Breast cancer Mother   . Bipolar disorder Brother   . Dementia Sister        twin sister 70 dx'ed dementia   Social History   Socioeconomic History  . Marital status: Divorced    Spouse name: Not on file  . Number of children: Not on file  . Years of education: Not on file  . Highest education level: Not on file  Occupational History  . Not on file  Tobacco Use  . Smoking status: Never Smoker  . Smokeless tobacco: Never Used  Vaping Use  . Vaping Use: Never used  Substance and Sexual Activity  . Alcohol use: Yes    Alcohol/week: 1.0 standard drink    Types: 1 Glasses of wine per week    Comment: occas. wine  . Drug use: No  . Sexual activity: Never  Other Topics Concern  . Not on file  Social History Narrative   Has twin sister identical lives in [place] does not live with her    Lived in [place], moved to [place] two years ago.   Divorced.      Has a living will.   Would desire CPR, would not desire extraordinary  measures.      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 Difficulty of Paying Living Expenses: Not hard at all  Food Insecurity: No Food Insecurity  . Worried About Charity fundraiser in the Last Year: Never true  . Ran Out of Food in the Last Year: Never true  Transportation Needs: No Transportation Needs  . Lack of Transportation (Medical): No  . Lack of Transportation (Non-Medical): No  Physical Activity: Not on file  Stress: No Stress Concern Present  . Feeling of Stress : Only a little  Social Connections: Unknown  . Frequency of Communication with Friends and Family: More than three times a week  . Frequency of Social Gatherings with Friends and Family: More than three times a week  . Attends Religious Services: Not on file  . Active Member of Clubs or Organizations: Not on file  . Attends Archivist Meetings: Not on file  . Marital Status: Not on file    Tobacco Counseling Counseling given: Not Answered   Clinical Intake:  Pre-visit preparation completed: Yes        Diabetes: No  How often do you need to have someone help you when you read instructions, pamphlets, or other written materials from your doctor or pharmacy?: 1 - Never   Interpreter Needed?: No      Activities of Daily Living In your present state of health, do you have any difficulty performing the following activities: 05/27/2020  Hearing? Y  Comment Hearing aids  Vision? N  Difficulty concentrating or making decisions? N  Walking or climbing stairs? N  Dressing or bathing? N  Doing errands, shopping? N  Preparing Food and eating ? N  Using the Toilet? N  In the past six months, have you accidently leaked urine? Y  Comment Managed with daily pad/liner  Do you have problems with loss of bowel control? N  Managing your Medications? N  Managing your Finances? N   Housekeeping or managing your Housekeeping? N  Some recent data might be hidden    Patient Care Team: McLean-Scocuzza, Nino Glow, MD as PCP - General (Internal Medicine) Vilinda Boehringer, MD (Inactive) as Consulting Physician (Internal Medicine) Elby Beck, FNP (Inactive) (Nurse Practitioner)  Indicate any recent Medical Services you may have received from other than [facility] providers in the past year (date may be approximate).     Assessment:   This is a routine wellness examination for Dorothy Church.  Hearing/Vision screen  Hearing Screening   125Hz  250Hz  500Hz  1000Hz  2000Hz  3000Hz  4000Hz  6000Hz  8000Hz   Right ear:           Left ear:           Comments: Hearing aids  Vision Screening Comments: Followed by [facility] Wears corrective lenses Visual acuity not assessed, virtual visit.  They have seen their ophthalmologist in the last 12 months.     Dietary issues and exercise activities discussed: Current Exercise Habits: Home exercise routine  Regular diet Good water intake  Goals      Patient Stated   .  Maintain Healthy Lifestyle (pt-stated)      Stay active Healthy diet Stay hydrated      Depression Screen PHQ 2/9 Scores 05/27/2020 09/15/2019 05/27/2019 04/28/2018 06/26/2017 05/01/2017 03/21/2017  PHQ - 2 Score 0 0 1 0 0 0 0  PHQ- 9 Score - - - 0 - - -    Fall Risk Fall Risk  05/27/2020 09/15/2019 05/27/2019 04/28/2018 06/26/2017  Falls in the past year? 1 0 0 0 No  Comment - - - - -  Number falls in past yr: 1 0 - - -  Comment - - - - -  Injury with Fall? (No Data) 0 - - -  Comment Followed up with pcp - - - -  Risk for fall due to : - - - - -  Risk for fall due to: Comment - - - - -  Follow up Falls evaluation completed Falls evaluation completed Falls evaluation completed - -    FALL RISK PREVENTION PERTAINING TO THE HOME: Handrails in use when climbing stairs? Yes Home free of loose throw rugs in walkways, pet beds, electrical cords, etc? Yes  Adequate  lighting in your home to reduce risk of falls? Yes   ASSISTIVE DEVICES UTILIZED TO PREVENT FALLS: Life home alert pull chord? Yes  Use of a cane, walker or w/c? No   TIMED UP AND GO: Was the test performed? Yes .  Length of time to ambulate 10 feet: 15 sec.   Gait steady and fast without use of assistive device.  Cognitive Function: MMSE - Mini Mental State Exam 04/28/2018 10/24/2016  Orientation to time 5 5  Orientation to Place 5 5  Registration 3 3  Attention/ Calculation 0 0  Recall 3 3  Language- name 2 objects 0 0  Language- repeat 1 1  Language- follow 3 step command 3 3  Language- read & follow direction 0 0  Write a sentence 0 0  Copy design 0 0  Total score 20 20     6CIT Screen 05/27/2019  What Year? 0 points  What month? 0 points  What time? 0 points  Count back from 20 0 points  Months in reverse 0 points  Repeat phrase 0 points  Total Score 0    Immunizations Immunization History  Administered Date(s) Administered  .  Fluad Quad(high Dose 65+) 12/05/2019  . Influenza Whole 12/14/2010  . Influenza, High Dose Seasonal PF 12/02/2017, 12/08/2018  . Influenza,inj,Quad PF,6+ Mos 11/10/2012, 11/05/2013, 11/10/2014, 10/17/2015, 10/24/2016  . Influenza-Unspecified 12/04/2011, 11/10/2012, 11/05/2013, 12/07/2018  . Moderna Sars-Covid-2 Vaccination 03/03/2019, 03/31/2019, 11/30/2019  . Pneumococcal Conjugate-13 10/07/2009, 04/23/2013, 11/24/2013  . Pneumococcal Polysaccharide-23 11/13/2009, 12/19/2018  . Td 04/17/2016  . Zoster 05/08/2007  . Zoster Recombinat (Shingrix) 04/09/2018, 09/15/2018   Health Maintenance There are no preventive care reminders to display for this patient. Health Maintenance  Topic Date Due  . INFLUENZA VACCINE  09/19/2020  . TETANUS/TDAP  04/17/2026  . DEXA SCAN  Completed  . COVID-19 Vaccine  Completed  . PNA vac Low Risk Adult  Completed  . HPV VACCINES  Aged Out   Colorectal cancer screening: No longer required.   Mammogram  status: Completed 05/20/20. Repeat every year  Bone Density status: Completed 04/14/19. Results reflect: Bone density results: OSTEOPENIA. Repeat every 2 years.  Lung Cancer Screening: (Low Dose CT Chest recommended if Age 77-80 years, 30 pack-year currently smoking OR have quit w/in 15years.) does not qualify.   Vision Screening: Recommended annual ophthalmology exams for early detection of glaucoma and other disorders of the eye. Is the patient up to date with their annual eye exam?  Yes  Who is the provider or what is the name of the office in which the patient attends annual eye exams? Visits every 6 [facility]. [facility].   Dental Screening: Recommended annual dental exams for proper oral hygiene.  Community Resource Referral / Chronic Care Management: CRR required this visit?  No   CCM required this visit?  No      Plan:   Keep all routine maintenance appointments.   Follow up 09/28/20 @ 2:00  I have personally reviewed and noted the following in the patient's chart:   . Medical and social history . Use of alcohol, tobacco or illicit drugs  . Current medications and supplements . Functional ability and status . Nutritional status . Physical activity . Advanced directives . List of other physicians . Hospitalizations, surgeries, and ER visits in previous 12 months . Vitals . Screenings to include cognitive, depression, and falls . Referrals and appointments  In addition, I have reviewed and discussed with patient certain preventive protocols, quality metrics, and best practice recommendations. A written personalized care plan for preventive services as well as general preventive health recommendations were provided to patient via mychart.     Varney Biles, LPN   04/21/2023

## 2020-06-07 DIAGNOSIS — R1314 Dysphagia, pharyngoesophageal phase: Secondary | ICD-10-CM | POA: Diagnosis not present

## 2020-06-07 DIAGNOSIS — K2270 Barrett's esophagus without dysplasia: Secondary | ICD-10-CM | POA: Diagnosis not present

## 2020-06-07 DIAGNOSIS — R1013 Epigastric pain: Secondary | ICD-10-CM | POA: Diagnosis not present

## 2020-06-27 DIAGNOSIS — H401110 Primary open-angle glaucoma, right eye, stage unspecified: Secondary | ICD-10-CM | POA: Diagnosis not present

## 2020-06-30 ENCOUNTER — Other Ambulatory Visit: Payer: Self-pay

## 2020-06-30 ENCOUNTER — Emergency Department: Payer: Medicare HMO

## 2020-06-30 ENCOUNTER — Emergency Department
Admission: EM | Admit: 2020-06-30 | Discharge: 2020-06-30 | Disposition: A | Discharge status: Home / Self Care | Payer: Medicare HMO | Attending: Emergency Medicine | Admitting: Emergency Medicine

## 2020-06-30 ENCOUNTER — Encounter: Payer: Self-pay | Admitting: *Deleted

## 2020-06-30 DIAGNOSIS — Z7982 Long term (current) use of aspirin: Secondary | ICD-10-CM | POA: Diagnosis not present

## 2020-06-30 DIAGNOSIS — R0602 Shortness of breath: Secondary | ICD-10-CM | POA: Diagnosis not present

## 2020-06-30 DIAGNOSIS — R4702 Dysphasia: Secondary | ICD-10-CM | POA: Diagnosis not present

## 2020-06-30 DIAGNOSIS — T59814A Toxic effect of smoke, undetermined, initial encounter: Secondary | ICD-10-CM | POA: Diagnosis not present

## 2020-06-30 DIAGNOSIS — Z96652 Presence of left artificial knee joint: Secondary | ICD-10-CM | POA: Diagnosis not present

## 2020-06-30 DIAGNOSIS — Z743 Need for continuous supervision: Secondary | ICD-10-CM | POA: Diagnosis not present

## 2020-06-30 DIAGNOSIS — J705 Respiratory conditions due to smoke inhalation: Secondary | ICD-10-CM | POA: Diagnosis not present

## 2020-06-30 DIAGNOSIS — Z7951 Long term (current) use of inhaled steroids: Secondary | ICD-10-CM | POA: Diagnosis not present

## 2020-06-30 DIAGNOSIS — E039 Hypothyroidism, unspecified: Secondary | ICD-10-CM | POA: Diagnosis not present

## 2020-06-30 DIAGNOSIS — J439 Emphysema, unspecified: Secondary | ICD-10-CM | POA: Diagnosis not present

## 2020-06-30 DIAGNOSIS — T59811A Toxic effect of smoke, accidental (unintentional), initial encounter: Secondary | ICD-10-CM

## 2020-06-30 MED ORDER — IPRATROPIUM-ALBUTEROL 0.5-2.5 (3) MG/3ML IN SOLN
3.0000 mL | Freq: Once | RESPIRATORY_TRACT | Status: AC
  Administered 2020-06-30: 3 mL via RESPIRATORY_TRACT
  Filled 2020-06-30: qty 3
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
## 2020-06-30 NOTE — ED Notes (Signed)
Patient taken to Xray  
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
## 2020-08-11 ENCOUNTER — Other Ambulatory Visit: Payer: Self-pay | Admitting: Internal Medicine

## 2020-08-13 ENCOUNTER — Encounter: Payer: Self-pay | Admitting: Internal Medicine

## 2020-08-15 NOTE — Telephone Encounter (Signed)
Okay for placard.

## 2020-08-30 ENCOUNTER — Encounter
Admission: RE | Disposition: A | Discharge status: Home / Self Care | Payer: Self-pay | Source: Home / Self Care | Attending: Gastroenterology

## 2020-08-30 ENCOUNTER — Ambulatory Visit
Admission: RE | Admit: 2020-08-30 | Discharge: 2020-08-30 | Disposition: A | Discharge status: Home / Self Care | Payer: Medicare HMO | Attending: Gastroenterology | Admitting: Gastroenterology

## 2020-08-30 ENCOUNTER — Ambulatory Visit: Payer: Medicare HMO | Admitting: Anesthesiology

## 2020-08-30 ENCOUNTER — Encounter: Payer: Self-pay | Admitting: *Deleted

## 2020-08-30 DIAGNOSIS — Z7951 Long term (current) use of inhaled steroids: Secondary | ICD-10-CM | POA: Insufficient documentation

## 2020-08-30 DIAGNOSIS — Z881 Allergy status to other antibiotic agents status: Secondary | ICD-10-CM | POA: Insufficient documentation

## 2020-08-30 DIAGNOSIS — K2100 Gastro-esophageal reflux disease with esophagitis, without bleeding: Secondary | ICD-10-CM | POA: Insufficient documentation

## 2020-08-30 DIAGNOSIS — Z7982 Long term (current) use of aspirin: Secondary | ICD-10-CM | POA: Insufficient documentation

## 2020-08-30 DIAGNOSIS — K449 Diaphragmatic hernia without obstruction or gangrene: Secondary | ICD-10-CM | POA: Diagnosis not present

## 2020-08-30 DIAGNOSIS — Z880 Allergy status to penicillin: Secondary | ICD-10-CM | POA: Insufficient documentation

## 2020-08-30 DIAGNOSIS — Z8673 Personal history of transient ischemic attack (TIA), and cerebral infarction without residual deficits: Secondary | ICD-10-CM | POA: Insufficient documentation

## 2020-08-30 DIAGNOSIS — K222 Esophageal obstruction: Secondary | ICD-10-CM | POA: Insufficient documentation

## 2020-08-30 DIAGNOSIS — R1310 Dysphagia, unspecified: Secondary | ICD-10-CM | POA: Insufficient documentation

## 2020-08-30 DIAGNOSIS — Z79899 Other long term (current) drug therapy: Secondary | ICD-10-CM | POA: Insufficient documentation

## 2020-08-30 DIAGNOSIS — Z885 Allergy status to narcotic agent status: Secondary | ICD-10-CM | POA: Diagnosis not present

## 2020-08-30 DIAGNOSIS — K219 Gastro-esophageal reflux disease without esophagitis: Secondary | ICD-10-CM | POA: Diagnosis not present

## 2020-08-30 DIAGNOSIS — Z886 Allergy status to analgesic agent status: Secondary | ICD-10-CM | POA: Insufficient documentation

## 2020-08-30 HISTORY — PX: ESOPHAGOGASTRODUODENOSCOPY: SHX5428

## 2020-08-30 SURGERY — EGD (ESOPHAGOGASTRODUODENOSCOPY)
Anesthesia: General

## 2020-08-30 MED ORDER — PROPOFOL 500 MG/50ML IV EMUL
INTRAVENOUS | Status: AC
  Filled 2020-08-30: qty 50

## 2020-08-30 MED ORDER — PROPOFOL 10 MG/ML IV BOLUS
INTRAVENOUS | Status: DC | PRN
  Administered 2020-08-30: 80 mg via INTRAVENOUS
  Administered 2020-08-30 (×2): 20 mg via INTRAVENOUS

## 2020-08-30 MED ORDER — LIDOCAINE HCL (CARDIAC) PF 100 MG/5ML IV SOSY
PREFILLED_SYRINGE | INTRAVENOUS | Status: DC | PRN
  Administered 2020-08-30: 50 mg via INTRAVENOUS

## 2020-08-30 MED ORDER — SODIUM CHLORIDE 0.9 % IV SOLN
INTRAVENOUS | Status: DC
  Administered 2020-08-30: 1000 mL via INTRAVENOUS

## 2020-08-30 NOTE — Transfer of Care (Signed)
Immediate Anesthesia Transfer of Care Note  Patient: RHETT NAJERA  Procedure(s) Performed: ESOPHAGOGASTRODUODENOSCOPY (EGD)  Patient Location: PACU and Endoscopy Unit  Anesthesia Type:General  Level of Consciousness: drowsy and patient cooperative  Airway & Oxygen Therapy: Patient Spontanous Breathing  Post-op Assessment: Report given to RN and Post -op Vital signs reviewed and stable  Post vital signs: Reviewed and stable  Last Vitals:  Vitals Value Taken Time  BP 107/61 08/30/20 0913  Temp    Pulse 72 08/30/20 0916  Resp 16 08/30/20 0916  SpO2 98 % 08/30/20 0916  Vitals shown include unvalidated device data.  Last Pain:  Vitals:   08/30/20 0820  TempSrc: Temporal  PainSc: 0-No pain         Complications: No notable events documented.

## 2020-08-30 NOTE — H&P (Signed)
Outpatient short stay form Pre-procedure 08/30/2020 8:49 AM Dorothy Miyamoto MD, MPH  Primary Physician: Dr. Terese Door  Reason for visit:  Dysphagia/BE's  History of present illness:   85 y/o lady with reported BE's and dysphagia. States she only has trouble swallowing once a month. Takes aspirin but no other blood thinners.    Current Facility-Administered Medications:    0.9 %  sodium chloride infusion, , Intravenous, Continuous, Gentry Seeber, Hilton Cork, MD, Last Rate: 20 mL/hr at 08/30/20 0833, 1,000 mL at 08/30/20 0833  Medications Prior to Admission  Medication Sig Dispense Refill Last Dose   acetaminophen (TYLENOL) 500 MG tablet Take 500 mg by mouth every 6 (six) hours as needed (PAIN).   Past Week   albuterol (VENTOLIN HFA) 108 (90 Base) MCG/ACT inhaler Inhale 2 puffs into the lungs every 6 (six) hours as needed for wheezing. 18 g 5 08/29/2020   aspirin 325 MG tablet Take 325 mg by mouth daily.    08/29/2020   Azelastine HCl 0.15 % SOLN Place 1 spray into the nose 2 (two) times daily as needed. 30 mL 11 Past Month   budesonide-formoterol (SYMBICORT) 160-4.5 MCG/ACT inhaler Inhale 2 puffs into the lungs 2 (two) times daily. 3 each 10 08/30/2020   calcium carbonate (TUMS - DOSED IN MG ELEMENTAL CALCIUM) 500 MG chewable tablet Chew 1 tablet by mouth 4 (four) times daily as needed for indigestion or heartburn.   08/29/2020   diclofenac sodium (VOLTAREN) 1 % GEL Apply 4 g topically 4 (four) times daily. (Patient taking differently: Apply 4 g topically 4 (four) times daily as needed (for pain).) 5 Tube 11 Past Week   famotidine (PEPCID) 20 MG tablet Take 20 mg by mouth daily.   08/29/2020   fluticasone (FLONASE) 50 MCG/ACT nasal spray Place into both nostrils daily.   Past Week   hydrocortisone 2.5 % cream Apply topically 2 (two) times daily. Prn right lower chin 60 g 11 Past Week   latanoprost (XALATAN) 0.005 % ophthalmic solution    08/29/2020   levothyroxine (SYNTHROID) 50 MCG tablet TAKE  1 TABLET BY MOUTH EVERY DAY BEFORE BREAKFAST 90 tablet 3 08/29/2020   loratadine (CLARITIN) 10 MG tablet Take 1 tablet (10 mg total) by mouth daily as needed for allergies. 90 tablet 3 Past Week   mupirocin ointment (BACTROBAN) 2 % Apply 1 application topically 2 (two) times daily. Forehead wound 30 g 0 Past Week   PARoxetine (PAXIL) 30 MG tablet TAKE 1 TABLET BY MOUTH EVERY MORNING 90 tablet 3 08/29/2020   Polyvinyl Alcohol-Povidone PF 1.4-0.6 % SOLN Place 2 drops into both eyes 2 (two) times daily as needed (for dry eyes).   Past Week   RABEprazole (ACIPHEX) 20 MG tablet    Past Week   simvastatin (ZOCOR) 20 MG tablet TAKE 1 TABLET BY MOUTH EVERYDAY AT BEDTIME 90 tablet 3 08/29/2020     Allergies  Allergen Reactions   Amoxicillin Other (See Comments)    Pencillins   Cefzil [Cefprozil] Other (See Comments)    Unknown   Cephalexin Other (See Comments)    Unknown   Propoxyphene Other (See Comments)    Unknown   Relafen [Nabumetone] Other (See Comments)    Unknown   Talwin [Pentazocine] Other (See Comments)    Unknown   Penicillins Other (See Comments)    Cannot remember reaction     Past Medical History:  Diagnosis Date   Abdominal pain    Abnormal CT of liver    Anxiety  Aortic stenosis    Aortic valve disorders    Arthritis    Asthma    Barrett's esophagus    [facility] last EGD 02/2018 Dr. Gustavo Lah    Colon polyps    COPD (chronic obstructive pulmonary disease) (HCC)    DDD (degenerative disc disease), cervical    Depressive disorder    Diverticulosis    Erosive esophagitis    Fundic gland polyps of stomach, benign    Gastric polyp    Gastric polyps    Gastritis    GERD (gastroesophageal reflux disease)    Goiter    Hiatal hernia    small    Hiatal hernia    Hypercholesteremia    Hypothyroidism    Impaired fasting glucose    Intrinsic asthma, unspecified    Left rib fracture    Obstructive sleep apnea (adult) (pediatric)    used cpap x 10 years per Dr. Mortimer Fries off  as of 12/30/18    OSA (obstructive sleep apnea)    used cpap x 10 years per Dr. Mortimer Fries off as of 12/30/18    Osteoporosis    Pneumonia    Pure hypercholesterolemia    Schatzki's ring    Schatzki's ring    Shingles    Syncope and collapse    TIA (transient ischemic attack)    UTI (urinary tract infection)    Vitamin D deficiency     Review of systems:  Otherwise negative.    Physical Exam  Gen: Alert, oriented. Appears stated age.  HEENT: PERRLA. Lungs: No respiratory distress CV: RRR Abd: soft, benign, no masses Ext: No edema    Planned procedures: Proceed with EGD. The patient understands the nature of the planned procedure, indications, risks, alternatives and potential complications including but not limited to bleeding, infection, perforation, damage to internal organs and possible oversedation/side effects from anesthesia. The patient agrees and gives consent to proceed.  Please refer to procedure notes for findings, recommendations and patient disposition/instructions.     Dorothy Miyamoto MD, MPH Gastroenterology 08/30/2020  8:49 AM

## 2020-08-30 NOTE — Interval H&P Note (Signed)
History and Physical Interval Note:  08/30/2020 8:52 AM  [name]  has presented today for surgery, with the diagnosis of DYSPEPSIA,BARRETT'S ESOPHAGUS,DYSPHAGIA.  The various methods of treatment have been discussed with the patient and family. After consideration of risks, benefits and other options for treatment, the patient has consented to  Procedure(s): ESOPHAGOGASTRODUODENOSCOPY (EGD) (N/A) as a surgical intervention.  The patient's history has been reviewed, patient examined, no change in status, stable for surgery.  I have reviewed the patient's chart and labs.  Questions were answered to the patient's satisfaction.     Dorothy Church  Ok to proceed with EGD

## 2020-08-30 NOTE — Op Note (Signed)
[facility] Patient Name: Dorothy Church Procedure Date: 08/30/2020 8:47 AM MRN: [id number] Account #: [id number] Date of Birth: 1935-06-25 Admit Type: Outpatient Age: 85 Room: [facility] ENDO ROOM 1 Gender: Female Note Status: Finalized Procedure:             Upper GI endoscopy Indications:           Dysphagia, Gastro-esophageal reflux disease, Barrett's                         esophagus Providers:             Andrey Farmer MD, MD Referring MD:          Nino Glow Mclean-Scocuzza MD, MD (Referring MD) Medicines:             Monitored Anesthesia Care Complications:         No immediate complications. Estimated blood loss:                         Minimal. Procedure:             Pre-Anesthesia Assessment:                        - Prior to the procedure, a History and Physical was                         performed, and patient medications and allergies were                         reviewed. The patient is competent. The risks and                         benefits of the procedure and the sedation options and                         risks were discussed with the patient. All questions                         were answered and informed consent was obtained.                         Patient identification and proposed procedure were                         verified by the physician, the nurse, the anesthetist                         and the technician in the endoscopy suite. Mental                         Status Examination: alert and oriented. Airway                         Examination: normal oropharyngeal airway and neck                         mobility. Respiratory Examination: clear to  auscultation. CV Examination: normal. Prophylactic                         Antibiotics: The patient does not require prophylactic                         antibiotics. Prior Anticoagulants: The patient has                         taken no previous  anticoagulant or antiplatelet agents                         except for aspirin. ASA Grade Assessment: II - A                         patient with mild systemic disease. After reviewing                         the risks and benefits, the patient was deemed in                         satisfactory condition to undergo the procedure. The                         anesthesia plan was to use monitored anesthesia care                         (MAC). Immediately prior to administration of                         medications, the patient was re-assessed for adequacy                         to receive sedatives. The heart rate, respiratory                         rate, oxygen saturations, blood pressure, adequacy of                         pulmonary ventilation, and response to care were                         monitored throughout the procedure. The physical                         status of the patient was re-assessed after the                         procedure.                        After obtaining informed consent, the endoscope was                         passed under direct vision. Throughout the procedure,                         the patient's blood pressure, pulse, and oxygen  saturations were monitored continuously. The Endoscope                         was introduced through the mouth, and advanced to the                         second part of duodenum. The upper GI endoscopy was                         accomplished without difficulty. The patient tolerated                         the procedure well. Findings:      A non-obstructing Schatzki ring was found in the lower third of the       esophagus. A TTS dilator was passed through the scope. Dilation with a       12-13.5-15 mm balloon dilator was performed to 15 mm. The dilation site       was examined and showed moderate mucosal disruption. Estimated blood       loss was minimal.      LA Grade A (one or more mucosal  breaks less than 5 mm, not extending       between tops of 2 mucosal folds) esophagitis with no bleeding was found.      A small hiatal hernia was present.      The entire examined stomach was normal.      The examined duodenum was normal. Impression:            - Non-obstructing Schatzki ring. Dilated.                        - LA Grade A reflux esophagitis with no bleeding.                        - Small hiatal hernia.                        - Normal stomach.                        - Normal examined duodenum.                        - No specimens collected. Recommendation:        - Discharge patient to home.                        - Resume previous diet.                        - Use a proton pump inhibitor PO BID for 12 weeks.                        - Repeat upper endoscopy in 12 weeks for retreatment.                        - Return to referring physician as previously                         scheduled. Procedure Code(s):     ---  Professional ---                        [phone], Esophagogastroduodenoscopy, flexible,                         transoral; with transendoscopic balloon dilation of                         esophagus (less than 30 mm diameter) Diagnosis Code(s):     --- Professional ---                        K22.2, Esophageal obstruction                        K21.00, Gastro-esophageal reflux disease with                         esophagitis, without bleeding                        K44.9, Diaphragmatic hernia without obstruction or                         gangrene                        K22.70, Barrett's esophagus without dysplasia                        R13.10, Dysphagia, unspecified CPT copyright 2019 [employer]. All rights reserved. The codes documented in this report are preliminary and upon coder review may  be revised to meet current compliance requirements. Andrey Farmer MD, MD 08/30/2020 9:14:13 AM Number of Addenda: 0 Note Initiated On: 08/30/2020  8:47 AM Estimated Blood Loss:  Estimated blood loss was minimal.      [facility]

## 2020-08-30 NOTE — Anesthesia Preprocedure Evaluation (Signed)
Anesthesia Evaluation  Patient identified by MRN, date of birth, ID band Patient awake    Reviewed: Allergy & Precautions, NPO status , Patient's Chart, lab work & pertinent test results  History of Anesthesia Complications Negative for: history of anesthetic complications  Airway Mallampati: III  TM Distance: <3 FB Neck ROM: Full    Dental no notable dental hx. (+) Chipped   Pulmonary asthma , sleep apnea (had repeat sleep study and no longer needs CPAP) , COPD,  COPD inhaler,    breath sounds clear to auscultation       Cardiovascular Exercise Tolerance: Good (-) hypertension(-) CAD, (-) Past MI, (-) Cardiac Stents and (-) CABG negative cardio ROS Normal cardiovascular exam - Systolic murmurs    Neuro/Psych neg Seizures PSYCHIATRIC DISORDERS Anxiety Depression TIAnegative neurological ROS  negative psych ROS   GI/Hepatic Neg liver ROS, hiatal hernia, PUD, GERD  Medicated and Controlled,  Endo/Other  neg diabetesHypothyroidism   Renal/GU negative Renal ROS  negative genitourinary   Musculoskeletal  (+) Arthritis ,   Abdominal (+) - obese,   Peds  Hematology negative hematology ROS (+)   Anesthesia Other Findings Past Medical History: No date: Abdominal pain No date: Abnormal CT of liver No date: Anxiety No date: Aortic stenosis No date: Aortic valve disorders No date: Arthritis No date: Asthma No date: Barrett's esophagus No date: Colonic polyp No date: COPD (chronic obstructive pulmonary disease) (HCC) No date: Depressive disorder No date: Diverticulosis No date: Fundic gland polyps of stomach, benign No date: GERD (gastroesophageal reflux disease) No date: Goiter No date: History of hiatal hernia No date: Hypercholesteremia No date: Hypothyroidism No date: Impaired fasting glucose No date: Intrinsic asthma, unspecified No date: Left rib fracture No date: Obstructive sleep apnea (adult)  (pediatric) No date: OSA (obstructive sleep apnea) No date: Osteoporosis No date: Pneumonia No date: Pure hypercholesterolemia No date: Syncope and collapse No date: TIA (transient ischemic attack) No date: Vitamin D deficiency   Reproductive/Obstetrics negative OB ROS                             Anesthesia Physical  Anesthesia Plan  ASA: 3  Anesthesia Plan: General   Post-op Pain Management:    Induction: Intravenous  PONV Risk Score and Plan: 2 and Propofol infusion and TIVA  Airway Management Planned: Natural Airway  Additional Equipment:   Intra-op Plan:   Post-operative Plan:   Informed Consent: I have reviewed the patients History and Physical, chart, labs and discussed the procedure including the risks, benefits and alternatives for the proposed anesthesia with the patient or authorized representative who has indicated his/her understanding and acceptance.     Dental advisory given  Plan Discussed with: CRNA and Anesthesiologist  Anesthesia Plan Comments:         Anesthesia Quick Evaluation

## 2020-08-30 NOTE — Anesthesia Postprocedure Evaluation (Signed)
Anesthesia Post Note  Patient: Dorothy Church  Procedure(s) Performed: ESOPHAGOGASTRODUODENOSCOPY (EGD)  Patient location during evaluation: Endoscopy Anesthesia Type: General Level of consciousness: awake and alert Pain management: pain level controlled Vital Signs Assessment: post-procedure vital signs reviewed and stable Respiratory status: spontaneous breathing, nonlabored ventilation, respiratory function stable and patient connected to nasal cannula oxygen Cardiovascular status: blood pressure returned to baseline and stable Postop Assessment: no apparent nausea or vomiting Anesthetic complications: no   No notable events documented.   Last Vitals:  Vitals:   08/30/20 0913 08/30/20 0933  BP: 107/61 134/67  Pulse:    Resp: 16   Temp: (!) 35.9 C   SpO2: 97%     Last Pain:  Vitals:   08/30/20 0933  TempSrc:   PainSc: 0-No pain                 Precious Haws Christol [place]

## 2020-08-31 ENCOUNTER — Encounter: Payer: Self-pay | Admitting: Gastroenterology

## 2020-09-28 ENCOUNTER — Encounter: Payer: Self-pay | Admitting: Internal Medicine

## 2020-09-28 ENCOUNTER — Other Ambulatory Visit: Payer: Self-pay

## 2020-09-28 ENCOUNTER — Ambulatory Visit (INDEPENDENT_AMBULATORY_CARE_PROVIDER_SITE_OTHER): Payer: Medicare HMO | Admitting: Internal Medicine

## 2020-09-28 VITALS — BP 110/72 | HR 94 | Temp 97.2°F | Ht 66.0 in | Wt 136.8 lb

## 2020-09-28 DIAGNOSIS — M25512 Pain in left shoulder: Secondary | ICD-10-CM | POA: Insufficient documentation

## 2020-09-28 DIAGNOSIS — M810 Age-related osteoporosis without current pathological fracture: Secondary | ICD-10-CM

## 2020-09-28 DIAGNOSIS — R7989 Other specified abnormal findings of blood chemistry: Secondary | ICD-10-CM

## 2020-09-28 DIAGNOSIS — F5104 Psychophysiologic insomnia: Secondary | ICD-10-CM

## 2020-09-28 DIAGNOSIS — K449 Diaphragmatic hernia without obstruction or gangrene: Secondary | ICD-10-CM | POA: Diagnosis not present

## 2020-09-28 DIAGNOSIS — E039 Hypothyroidism, unspecified: Secondary | ICD-10-CM | POA: Diagnosis not present

## 2020-09-28 DIAGNOSIS — E559 Vitamin D deficiency, unspecified: Secondary | ICD-10-CM | POA: Diagnosis not present

## 2020-09-28 DIAGNOSIS — R69 Illness, unspecified: Secondary | ICD-10-CM | POA: Diagnosis not present

## 2020-09-28 DIAGNOSIS — E2839 Other primary ovarian failure: Secondary | ICD-10-CM

## 2020-09-28 DIAGNOSIS — K222 Esophageal obstruction: Secondary | ICD-10-CM

## 2020-09-28 DIAGNOSIS — K219 Gastro-esophageal reflux disease without esophagitis: Secondary | ICD-10-CM

## 2020-09-28 DIAGNOSIS — Z9181 History of falling: Secondary | ICD-10-CM

## 2020-09-28 DIAGNOSIS — F439 Reaction to severe stress, unspecified: Secondary | ICD-10-CM | POA: Diagnosis not present

## 2020-09-28 HISTORY — DX: Pain in left shoulder: M25.512

## 2020-09-28 MED ORDER — TRAZODONE HCL 50 MG PO TABS: 25.0000 mg | ORAL_TABLET | Freq: Every evening | ORAL | 5 refills | Status: DC | PRN

## 2020-09-28 NOTE — Patient Instructions (Addendum)
Results for Dorothy Church, Dorothy Church (MRN [id number]) as of 09/28/2020 14:52  Ref. Range 04/28/2020 14:49  Sodium Latest Ref Range: 135 - 145 mEq/L 141  Potassium Latest Ref Range: 3.5 - 5.1 mEq/L 3.9  Chloride Latest Ref Range: 96 - 112 mEq/L 102  CO2 Latest Ref Range: 19 - 32 mEq/L 31  Glucose Latest Ref Range: 70 - 99 mg/dL 82  BUN Latest Ref Range: 6 - 23 mg/dL 16  Creatinine Latest Ref Range: 0.40 - 1.20 mg/dL 1.01  Calcium Latest Ref Range: 8.4 - 10.5 mg/dL 9.4  Alkaline Phosphatase Latest Ref Range: 39 - 117 U/L 55  Albumin Latest Ref Range: 3.5 - 5.2 g/dL 4.1  AST Latest Ref Range: 0 - 37 U/L 14  ALT Latest Ref Range: 0 - 35 U/L 10  Total Protein Latest Ref Range: 6.0 - 8.3 g/dL 6.4  Total Bilirubin Latest Ref Range: 0.2 - 1.2 mg/dL 0.5  GFR Latest Ref Range: >60.00 mL/min 51.88 (L)  Goal >60    Nature made L theanine 100-200 mg at night for sleep    Bone density due 04/13/21   Gastroesophageal Reflux Disease, Adult Gastroesophageal reflux (GER) happens when acid from the stomach flows up into the tube that connects the mouth and the stomach (esophagus). Normally, food travels down the esophagus and stays in the stomach to be digested. However, when a person has GER, food and stomach acid sometimes move back up into the esophagus. If this becomes a more serious problem, the person may be diagnosed with a disease called gastroesophageal reflux disease (GERD). GERD occurs when the reflux: Happens often. Causes frequent or severe symptoms. Causes problems such as damage to the esophagus. When stomach acid comes in contact with the esophagus, the acid may cause inflammation in the esophagus. Over time, GERD may create small holes (ulcers) in the lining of the esophagus. What are the causes? This condition is caused by a problem with the muscle between the esophagus and the stomach (lower esophageal sphincter, or LES). Normally, the LES muscle closes after food passes through the esophagus  to the stomach. When the LES is weakened or abnormal, it does not close properly, and that allows food and stomach acid to go back up into theesophagus. The LES can be weakened by certain dietary substances, medicines, and medical conditions, including: Tobacco use. Pregnancy. Having a hiatal hernia. Alcohol use. Certain foods and beverages, such as coffee, chocolate, onions, and peppermint. What increases the risk? You are more likely to develop this condition if you: Have an increased body weight. Have a connective tissue disorder. Take NSAIDs, such as ibuprofen. What are the signs or symptoms? Symptoms of this condition include: Heartburn. Difficult or painful swallowing and the feeling of having a lump in the throat. A bitter taste in the mouth. Bad breath and having a large amount of saliva. Having an upset or bloated stomach and belching. Chest pain. Different conditions can cause chest pain. Make sure you see your health care provider if you experience chest pain. Shortness of breath or wheezing. Ongoing (chronic) cough or a nighttime cough. Wearing away of tooth enamel. Weight loss. How is this diagnosed? This condition may be diagnosed based on a medical history and a physical exam. To determine if you have mild or severe GERD, your health care provider may also monitor how you respond to treatment. You may also have tests, including: A test to examine your stomach and esophagus with a small camera (endoscopy). A test that measures  the acidity level in your esophagus. A test that measures how much pressure is on your esophagus. A barium swallow or modified barium swallow test to show the shape, size, and functioning of your esophagus. How is this treated? Treatment for this condition may vary depending on how severe your symptoms are. Your health care provider may recommend: Changes to your diet. Medicine. Surgery. The goal of treatment is to help relieve your symptoms and  to preventcomplications. Follow these instructions at home: Eating and drinking  Follow a diet as recommended by your health care provider. This may involve avoiding foods and drinks such as: Coffee and tea, with or without caffeine. Drinks that contain alcohol. Energy drinks and sports drinks. Carbonated drinks or sodas. Chocolate and cocoa. Peppermint and mint flavorings. Garlic and onions. Horseradish. Spicy and acidic foods, including peppers, chili powder, curry powder, vinegar, hot sauces, and barbecue sauce. Citrus fruit juices and citrus fruits, such as oranges, lemons, and limes. Tomato-based foods, such as red sauce, chili, salsa, and pizza with red sauce. Fried and fatty foods, such as donuts, french fries, potato chips, and high-fat dressings. High-fat meats, such as hot dogs and fatty cuts of red and white meats, such as rib eye steak, sausage, ham, and bacon. High-fat dairy items, such as whole milk, butter, and cream cheese. Eat small, frequent meals instead of large meals. Avoid drinking large amounts of liquid with your meals. Avoid eating meals during the 2-3 hours before bedtime. Avoid lying down right after you eat. Do not exercise right after you eat.  Lifestyle  Do not use any products that contain nicotine or tobacco. These products include cigarettes, chewing tobacco, and vaping devices, such as e-cigarettes. If you need help quitting, ask your health care provider. Try to reduce your stress by using methods such as yoga or meditation. If you need help reducing stress, ask your health care provider. If you are overweight, reduce your weight to an amount that is healthy for you. Ask your health care provider for guidance about a safe weight loss goal.  General instructions Pay attention to any changes in your symptoms. Take over-the-counter and prescription medicines only as told by your health care provider. Do not take aspirin, ibuprofen, or other NSAIDs  unless your health care provider told you to take these medicines. Wear loose-fitting clothing. Do not wear anything tight around your waist that causes pressure on your abdomen. Raise (elevate) the head of your bed about 6 inches (15 cm). You can use a wedge to do this. Avoid bending over if this makes your symptoms worse. Keep all follow-up visits. This is important. Contact a health care provider if: You have: New symptoms. Unexplained weight loss. Difficulty swallowing or it hurts to swallow. Wheezing or a persistent cough. A hoarse voice. Your symptoms do not improve with treatment. Get help right away if: You have sudden pain in your arms, neck, jaw, teeth, or back. You suddenly feel sweaty, dizzy, or light-headed. You have chest pain or shortness of breath. You vomit and the vomit is green, yellow, or black, or it looks like blood or coffee grounds. You faint. You have stool that is red, bloody, or black. You cannot swallow, drink, or eat. These symptoms may represent a serious problem that is an emergency. Do not wait to see if the symptoms will go away. Get medical help right away. Call your local emergency services (911 in the [place] Do not drive yourself to the hospital. Summary Gastroesophageal reflux happens when  acid from the stomach flows up into the esophagus. GERD is a disease in which the reflux happens often, causes frequent or severe symptoms, or causes problems such as damage to the esophagus. Treatment for this condition may vary depending on how severe your symptoms are. Your health care provider may recommend diet and lifestyle changes, medicine, or surgery. Contact a health care provider if you have new or worsening symptoms. Take over-the-counter and prescription medicines only as told by your health care provider. Do not take aspirin, ibuprofen, or other NSAIDs unless your health care provider told you to do so. Keep all follow-up visits as told by your health  care provider. This is important. This information is not intended to replace advice given to you by your health care provider. Make sure you discuss any questions you have with your healthcare provider. Document Revised: 08/17/2019 Document Reviewed: 08/17/2019 Elsevier Patient Education  2022 [employer] for Gastroesophageal Reflux Disease, Adult When you have gastroesophageal reflux disease (GERD), the foods you eat and your eating habits are very important. Choosing the right foods can help ease the discomfort of GERD. Consider working with a dietitian to help you [employer] choices. What are tips for following this plan? Reading food labels Look for foods that are low in saturated fat. Foods that have less than 5% of daily value (DV) of fat and 0 g of trans fats may help with your symptoms. Cooking Cook foods using methods other than frying. This may include baking, steaming, grilling, or broiling. These are all methods that do not need a lot of fat for cooking. To add flavor, try to use herbs that are low in spice and acidity. Meal planning  Choose healthy foods that are low in fat, such as fruits, vegetables, whole grains, low-fat dairy products, lean meats, fish, and poultry. Eat frequent, small meals instead of three large meals each day. Eat your meals slowly, in a relaxed setting. Avoid bending over or lying down until 2-3 hours after eating. Limit high-fat foods such as fatty meats or fried foods. Limit your intake of fatty foods, such as oils, butter, and shortening. Avoid the following as told by your health care provider: Foods that cause symptoms. These may be different for different people. Keep a food diary to keep track of foods that cause symptoms. Alcohol. Drinking large amounts of liquid with meals. Eating meals during the 2-3 hours before bed.  Lifestyle Maintain a healthy weight. Ask your health care provider what weight is healthy for you. If  you need to lose weight, work with your health care provider to do so safely. Exercise for at least 30 minutes on 5 or more days each week, or as told by your health care provider. Avoid wearing clothes that fit tightly around your waist and chest. Do not use any products that contain nicotine or tobacco. These products include cigarettes, chewing tobacco, and vaping devices, such as e-cigarettes. If you need help quitting, ask your health care provider. Sleep with the head of your bed raised. Use a wedge under the mattress or blocks under the bed frame to raise the head of the bed. Chew sugar-free gum after mealtimes. What foods should I eat?  Eat a healthy, well-balanced diet of fruits, vegetables, whole grains, low-fat dairy products, lean meats, fish, and poultry. Each person is different. Foods that may trigger symptoms in one person may not trigger any symptoms in another person. Work with your health care provider to identify  foods that are safe foryou. The items listed above may not be a complete list of recommended foods and beverages. Contact a dietitian for more information. What foods should I avoid? Limiting some of these foods may help manage the symptoms of GERD. Everyone is different. Consult a dietitian or your health care provider to help youidentify the exact foods to avoid, if any. Fruits Any fruits prepared with added fat. Any fruits that cause symptoms. For some people this may include citrus fruits, such as oranges, grapefruit, pineapple,and lemons. Vegetables Deep-fried vegetables. [place] fries. Any vegetables prepared with added fat. Any vegetables that cause symptoms. For some people, this may include tomatoesand tomato products, chili peppers, onions and garlic, and horseradish. Grains Pastries or quick breads with added fat. Meats and other proteins High-fat meats, such as fatty beef or pork, hot dogs, ribs, ham, sausage, salami, and bacon. Fried meat or protein,  including fried fish and friedchicken. Nuts and nut butters, in large amounts. Dairy Whole milk and chocolate milk. Sour cream. Cream. Ice cream. Cream cheese.Milkshakes. Fats and oils Butter. Margarine. Shortening. Ghee. Beverages Coffee and tea, with or without caffeine. Carbonated beverages. Sodas. Energy drinks. Fruit juice made with acidic fruits, such as orange or grapefruit.Tomato juice. Alcoholic drinks. Sweets and desserts Chocolate and cocoa. Donuts. Seasonings and condiments Pepper. Peppermint and spearmint. Added salt. Any condiments, herbs, or seasonings that cause symptoms. For some people, this may include curry, hotsauce, or vinegar-based salad dressings. The items listed above may not be a complete list of foods and beverages to avoid. Contact a dietitian for more information. Questions to ask your health care provider Diet and lifestyle changes are usually the first steps that are taken to manage symptoms of GERD. If diet and lifestyle changes do not improve your symptoms,talk with your health care provider about taking medicines. Where to find more information [employer] for Gastrointestinal Disorders: aboutgerd.org Summary When you have gastroesophageal reflux disease (GERD), food and lifestyle choices may be very helpful in easing the discomfort of GERD. Eat frequent, small meals instead of three large meals each day. Eat your meals slowly, in a relaxed setting. Avoid bending over or lying down until 2-3 hours after eating. Limit high-fat foods such as fatty meats or fried foods. This information is not intended to replace advice given to you by your health care provider. Make sure you discuss any questions you have with your healthcare provider. Document Revised: 08/17/2019 Document Reviewed: 08/17/2019 Elsevier Patient Education  [place].  Trazodone Tablets What is this medication? TRAZODONE (TRAZ oh done) treats depression. It increases the  amount ofserotonin in the brain, a hormone that helps regulate mood. This medicine may be used for other purposes; ask your health care provider orpharmacist if you have questions. COMMON BRAND NAME(S): Desyrel What should I tell my care team before I take this medication? They need to know if you have any of these conditions: Attempted suicide or thinking about it Bipolar disorder Bleeding problems Glaucoma Heart disease, or previous heart attack Irregular heart beat Kidney or liver disease Low levels of sodium in the blood An unusual or allergic reaction to trazodone, other medications, foods, dyes or preservatives Pregnant or trying to get pregnant Breast-feeding How should I use this medication? Take this medication by mouth with a glass of water. Follow the directions on the prescription label. Take this medication shortly after a meal or a light snack. Take your medication at regular intervals. Do not take your medication more often than  directed. Do not stop taking this medication suddenly except upon the advice of your care team. Stopping this medication too quickly maycause serious side effects or your condition may worsen. A special MedGuide will be given to you by the pharmacist with eachprescription and refill. Be sure to read this information carefully each time. Talk to your care team regarding the use of this medication in children.Special care may be needed. Overdosage: If you think you have taken too much of this medicine contact apoison control center or emergency room at once. NOTE: This medicine is only for you. Do not share this medicine with others. What if I miss a dose? If you miss a dose, take it as soon as you can. If it is almost time for yournext dose, take only that dose. Do not take double or extra doses. What may interact with this medication? Do not take this medication with any of the following: Certain medications for fungal infections like fluconazole,  itraconazole, ketoconazole, posaconazole, voriconazole Cisapride Dronedarone Linezolid MAOIs like Carbex, Eldepryl, Marplan, Nardil, and Parnate Mesoridazine Methylene blue (injected into a vein) Pimozide Saquinavir Thioridazine This medication may also interact with the following: Alcohol Antiviral medications for HIV or AIDS Aspirin and aspirin-like medications Barbiturates like phenobarbital Certain medications for blood pressure, heart disease, irregular heart beat Certain medications for depression, anxiety, or psychotic disturbances Certain medications for migraine headache like almotriptan, eletriptan, frovatriptan, naratriptan, rizatriptan, sumatriptan, zolmitriptan Certain medications for seizures like carbamazepine and phenytoin Certain medications for sleep Certain medications that treat or prevent blood clots like dalteparin, enoxaparin, warfarin Digoxin Fentanyl Lithium NSAIDS, medications for pain and inflammation, like ibuprofen or naproxen Other medications that prolong the QT interval (cause an abnormal heart rhythm) like dofetilide Rasagiline Supplements like St. John's wort, kava kava, valerian Tramadol Tryptophan This list may not describe all possible interactions. Give your health care provider a list of all the medicines, herbs, non-prescription drugs, or dietary supplements you use. Also tell them if you smoke, drink alcohol, or use illegaldrugs. Some items may interact with your medicine. What should I watch for while using this medication? Tell your care team if your symptoms do not get better or if they get worse. Visit your care team for regular checks on your progress. Because it may take several weeks to see the full effects of this medication, it is important tocontinue your treatment as prescribed by your care team. Watch for new or worsening thoughts of suicide or depression. This includes sudden changes in mood, behaviors, or thoughts. These changes  can happen at any time but are more common in the beginning of treatment or after a change in dose. Call your care team right away if you experience these thoughts orworsening depression. Manic episodes may happen in patients with bipolar disorder who take this medication. Watch for changes in feelings or behaviors such as feeling anxious, nervous, agitated, panicky, irritable, hostile, aggressive, impulsive, severely restless, overly excited and hyperactive, or trouble sleeping. These changes can happen at any time but are more common in the beginning of treatment or after a change in dose. Call your care team right away if you notice any ofthese symptoms. You may get drowsy or dizzy. Do not drive, use machinery, or do anything that needs mental alertness until you know how this medication affects you. Do not stand or sit up quickly, especially if you are an older patient. This reduces the risk of dizzy or fainting spells. Alcohol may interfere with the effect ofthis medication. Avoid  alcoholic drinks. This medication may cause dry eyes and blurred vision. If you wear contact lenses you may feel some discomfort. Lubricating drops may help. See your eyedoctor if the problem does not go away or is severe. Your mouth may get dry. Chewing sugarless gum, sucking hard candy and drinking plenty of water may help. Contact your care team if the problem does not goaway or is severe. What side effects may I notice from receiving this medication? Side effects that you should report to your care team as soon as possible: Allergic reactions-skin rash, itching, hives, swelling of the face, lips, tongue, or throat Bleeding-bloody or black, tar-like stools, red or dark brown urine, vomiting blood or brown material that looks like coffee grounds, small, red or purple spots on skin, unusual bleeding or bruising Heart rhythm changes-fast or irregular heartbeat, dizziness, feeling faint or lightheaded, chest pain, trouble  breathing Low blood pressure-dizziness, feeling faint or lightheaded, blurry vision Low sodium level-muscle weakness, fatigue, dizziness, headache, confusion Prolonged or painful erection Serotonin syndrome-irritability, confusion, fast or irregular heartbeat, muscle stiffness, twitching muscles, sweating, high fever, seizures, chills, vomiting, diarrhea Sudden eye pain or change in vision such as blurry vision, seeing halos around lights, vision loss Thoughts of suicide or self-harm, worsening mood, feelings of depression Side effects that usually do not require medical attention (report to your careteam if they continue or are bothersome): Change in sex drive or performance Constipation Dizziness Drowsiness Dry mouth This list may not describe all possible side effects. Call your doctor for medical advice about side effects. You may report side effects to FDA at1-800-FDA-1088. Where should I keep my medication? Keep out of the reach of children and pets. Store at room temperature between 15 and 30 degrees C (59 to 86 degrees F). Protect from light. Keep container tightly closed. Throw away any unusedmedication after the expiration date. NOTE: This sheet is a summary. It may not cover all possible information. If you have questions about this medicine, talk to your doctor, pharmacist, orhealth care provider.  2022 Elsevier/[employer] Standard (2019-12-28 14:46:11)

## 2020-09-28 NOTE — Progress Notes (Signed)
Chief Complaint  Patient presents with   Follow-up   F/u  1. Stress had to clean twin sisters with dementias place out and now in memory care/LTC at retirement home and had to get rid of sentimental items of her family/sisters she tries to see sister daily  2. Insomnia trouble falling asleep tried otc sleep aids, melatonin tylenol pm w/o relief and wants meds for sleep  3. Reduced gfr last 06/2020 50s from 89s bmet had today with Dr. Honor Junes due to reclast infusion sch 11/2020 endocrine and will repeat dexa 03/2021 call to schedule  4. Falls x 2  5. Endoscopy with [facility] s/p dilatation dysphagia is better and repeat EGD sch per GI in 3 months she is also having GERD and protonix 40 mg bid is not helping cc'ed DR Locklear   Review of Systems  Constitutional:  Negative for weight loss.  HENT:  Negative for hearing loss.   Eyes:  Negative for blurred vision.  Respiratory:  Negative for shortness of breath.   Cardiovascular:  Negative for chest pain.  Gastrointestinal:  Positive for heartburn.  Musculoskeletal:  Positive for falls.  Skin:  Negative for rash.  Neurological:  Negative for dizziness and headaches.  Psychiatric/Behavioral:  Negative for memory loss.   Past Medical History:  Diagnosis Date   Abdominal pain    Abnormal CT of liver    Anxiety    Aortic stenosis    Aortic valve disorders    Arthritis    Asthma    Barrett's esophagus    [facility] last EGD 02/2018 Dr. Gustavo Lah    Colon polyps    COPD (chronic obstructive pulmonary disease) (HCC)    DDD (degenerative disc disease), cervical    Depressive disorder    Diverticulosis    Erosive esophagitis    Fundic gland polyps of stomach, benign    Gastric polyp    Gastric polyps    Gastritis    GERD (gastroesophageal reflux disease)    Goiter    Hiatal hernia    small    Hiatal hernia    Hypercholesteremia    Hypothyroidism    Impaired fasting glucose    Intrinsic asthma, unspecified    Left rib fracture     Obstructive sleep apnea (adult) (pediatric)    used cpap x 10 years per Dr. Mortimer Fries off as of 12/30/18    OSA (obstructive sleep apnea)    used cpap x 10 years per Dr. Mortimer Fries off as of 12/30/18    Osteoporosis    Pneumonia    Pure hypercholesterolemia    Schatzki's ring    Schatzki's ring    Shingles    Syncope and collapse    TIA (transient ischemic attack)    UTI (urinary tract infection)    Vitamin D deficiency    Past Surgical History:  Procedure Laterality Date   BREAST EXCISIONAL BIOPSY Left    COLONOSCOPY     COLONOSCOPY WITH ESOPHAGOGASTRODUODENOSCOPY (EGD)     COLONOSCOPY WITH PROPOFOL N/A 12/14/2014   Procedure: COLONOSCOPY WITH PROPOFOL;  Surgeon: Lollie Sails, MD;  Location: [facility] ENDOSCOPY;  Service: Endoscopy;  Laterality: N/A;   COLONOSCOPY WITH PROPOFOL N/A 03/18/2018   Procedure: COLONOSCOPY WITH PROPOFOL;  Surgeon: Lollie Sails, MD;  Location: [facility] ENDOSCOPY;  Service: Endoscopy;  Laterality: N/A;   ENUCLEATION     ESOPHAGOGASTRODUODENOSCOPY N/A 08/30/2020   Procedure: ESOPHAGOGASTRODUODENOSCOPY (EGD);  Surgeon: Lesly Rubenstein, MD;  Location: [facility] ENDOSCOPY;  Service: Endoscopy;  Laterality:  N/A;   ESOPHAGOGASTRODUODENOSCOPY (EGD) WITH PROPOFOL N/A 12/14/2014   Procedure: ESOPHAGOGASTRODUODENOSCOPY (EGD) WITH PROPOFOL;  Surgeon: Lollie Sails, MD;  Location: [facility] ENDOSCOPY;  Service: Endoscopy;  Laterality: N/A;   ESOPHAGOGASTRODUODENOSCOPY (EGD) WITH PROPOFOL N/A 10/18/2016   Procedure: ESOPHAGOGASTRODUODENOSCOPY (EGD) WITH PROPOFOL;  Surgeon: Lollie Sails, MD;  Location: [facility] ENDOSCOPY;  Service: Endoscopy;  Laterality: N/A;   ESOPHAGOGASTRODUODENOSCOPY (EGD) WITH PROPOFOL N/A 03/18/2018   Procedure: ESOPHAGOGASTRODUODENOSCOPY (EGD) WITH PROPOFOL;  Surgeon: Lollie Sails, MD;  Location: [facility] ENDOSCOPY;  Service: Endoscopy;  Laterality: N/A;   EYE SURGERY     HIP SURGERY Left    pin and plate   IR ANGIO INTRA EXTRACRAN SEL COM CAROTID  INNOMINATE BILAT MOD SED  10/17/2018   IR ANGIO VERTEBRAL SEL VERTEBRAL BILAT MOD SED  10/17/2018   IR ANGIOGRAM EXTREMITY LEFT  10/17/2018   Left Total Knee Arthroplasty     Dr Marry Guan and also had 39 years ago from 2020    nasal polyps     removed x 2 affects sense of smell x 10-15 years from 04/28/20   prosthesis eye implant     REPLACEMENT UNICONDYLAR JOINT KNEE Left    TONSILLECTOMY     TOTAL KNEE REVISION Left 09/13/2017   Procedure: TOTAL KNEE REVISION;  Surgeon: Dereck Leep, MD;  Location: [facility] ORS;  Service: Orthopedics;  Laterality: Left;   Family History  Problem Relation Age of Onset   Breast cancer Mother        pagets dx 76s   Heart disease Father    Alcohol abuse Father    Dementia Sister        twin sister 32 dx'ed dementia   Bipolar disorder Brother    Social History   Socioeconomic History   Marital status: Divorced    Spouse name: Not on file   Number of children: Not on file   Years of education: Not on file   Highest education level: Not on file  Occupational History   Not on file  Tobacco Use   Smoking status: Never   Smokeless tobacco: Never  Vaping Use   Vaping Use: Never used  Substance and Sexual Activity   Alcohol use: Yes    Alcohol/week: 1.0 standard drink    Types: 1 Glasses of wine per week    Comment: occas. wine   Drug use: No   Sexual activity: Never  Other Topics Concern   Not on file  Social History Narrative   Has twin sister identical lives in [place] does not live with her    Lived in [place], moved to [place] two years ago.   Divorced.      Has a living will.   Would desire CPR, would not desire extraordinary measures.      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   Social Determinants of Radio broadcast assistant Strain: Low Risk    Difficulty of Paying Living Expenses: Not hard at all  Food Insecurity: No Food Insecurity   Worried About Charity fundraiser in the  Last Year: Never true   Arboriculturist in the Last Year: Never true  Transportation Needs: No Transportation Needs   Lack of Transportation (Medical): No   Lack of Transportation (Non-Medical): No  Physical Activity: Not on file  Stress: No Stress Concern Present   Feeling of Stress : Only a little  Social Connections: Unknown  Frequency of Communication with Friends and Family: More than three times a week   Frequency of Social Gatherings with Friends and Family: More than three times a week   Attends Religious Services: Not on Electrical engineer or Organizations: Not on file   Attends Archivist Meetings: Not on file   Marital Status: Not on file  Intimate Partner Violence: Not At Risk   Fear of Current or Ex-Partner: No   Emotionally Abused: No   Physically Abused: No   Sexually Abused: No   Current Meds  Medication Sig   acetaminophen (TYLENOL) 500 MG tablet Take 500 mg by mouth every 6 (six) hours as needed (PAIN).   albuterol (VENTOLIN HFA) 108 (90 Base) MCG/ACT inhaler Inhale 2 puffs into the lungs every 6 (six) hours as needed for wheezing.   aspirin 325 MG tablet Take 325 mg by mouth daily.    Azelastine HCl 0.15 % SOLN Place 1 spray into the nose 2 (two) times daily as needed.   budesonide-formoterol (SYMBICORT) 160-4.5 MCG/ACT inhaler Inhale 2 puffs into the lungs 2 (two) times daily.   calcium carbonate (TUMS - DOSED IN MG ELEMENTAL CALCIUM) 500 MG chewable tablet Chew 1 tablet by mouth 4 (four) times daily as needed for indigestion or heartburn.   diclofenac sodium (VOLTAREN) 1 % GEL Apply 4 g topically 4 (four) times daily. (Patient taking differently: Apply 4 g topically 4 (four) times daily as needed (for pain).)   fluticasone (FLONASE) 50 MCG/ACT nasal spray Place into both nostrils daily.   hydrocortisone 2.5 % cream Apply topically 2 (two) times daily. Prn right lower chin   latanoprost (XALATAN) 0.005 % ophthalmic solution    levothyroxine  (SYNTHROID) 50 MCG tablet TAKE 1 TABLET BY MOUTH EVERY DAY BEFORE BREAKFAST   loratadine (CLARITIN) 10 MG tablet Take 1 tablet (10 mg total) by mouth daily as needed for allergies.   mupirocin ointment (BACTROBAN) 2 % Apply 1 application topically 2 (two) times daily. Forehead wound   pantoprazole (PROTONIX) 40 MG tablet Take 40 mg by mouth 2 (two) times daily before a meal.   PARoxetine (PAXIL) 30 MG tablet TAKE 1 TABLET BY MOUTH EVERY MORNING   Polyvinyl Alcohol-Povidone PF 1.4-0.6 % SOLN Place 2 drops into both eyes 2 (two) times daily as needed (for dry eyes).   RABEprazole (ACIPHEX) 20 MG tablet    simvastatin (ZOCOR) 20 MG tablet TAKE 1 TABLET BY MOUTH EVERYDAY AT BEDTIME   traZODone (DESYREL) 50 MG tablet Take 0.5-1 tablets (25-50 mg total) by mouth at bedtime as needed for sleep.   Allergies  Allergen Reactions   Amoxicillin Other (See Comments)    Pencillins   Cefzil [Cefprozil] Other (See Comments)    Unknown   Cephalexin Other (See Comments)    Unknown   Propoxyphene Other (See Comments)    Unknown   Relafen [Nabumetone] Other (See Comments)    Unknown   Talwin [Pentazocine] Other (See Comments)    Unknown   Penicillins Other (See Comments)    Cannot remember reaction   No results found for this or any previous visit (from the past 2160 hour(s)). Objective  Body mass index is 22.08 kg/m. Wt Readings from Last 3 Encounters:  09/28/20 136 lb 12.8 oz (62.1 kg)  08/30/20 135 lb (61.2 kg)  06/30/20 142 lb 13.7 oz (64.8 kg)   Temp Readings from Last 3 Encounters:  09/28/20 (!) 97.2 F (36.2 C) (Temporal)  08/30/20 (!) 96.6  F (35.9 C) (Temporal)  06/30/20 98.1 F (36.7 C) (Oral)   BP Readings from Last 3 Encounters:  09/28/20 110/72  08/30/20 134/67  06/30/20 131/65   Pulse Readings from Last 3 Encounters:  09/28/20 94  08/30/20 85  06/30/20 85    Physical Exam Vitals and nursing note reviewed.  Constitutional:      Appearance: Normal appearance. She is  well-developed and well-groomed.  HENT:     Head: Normocephalic and atraumatic.  Eyes:     Conjunctiva/sclera: Conjunctivae normal.     Pupils: Pupils are equal, round, and reactive to light.  Cardiovascular:     Rate and Rhythm: Normal rate and regular rhythm.     Heart sounds: Normal heart sounds. No murmur heard. Pulmonary:     Effort: Pulmonary effort is normal.     Breath sounds: Normal breath sounds.  Abdominal:     Tenderness: There is no abdominal tenderness.  Skin:    General: Skin is warm and dry.  Neurological:     General: No focal deficit present.     Mental Status: She is alert and oriented to person, place, and time. Mental status is at baseline.     Gait: Gait normal.  Psychiatric:        Attention and Perception: Attention and perception normal.        Mood and Affect: Mood and affect normal.        Speech: Speech normal.        Behavior: Behavior normal. Behavior is cooperative.        Thought Content: Thought content normal.        Cognition and Memory: Cognition and memory normal.        Judgment: Judgment normal.    Assessment  Plan  Chronic insomnia - Plan: traZODone (DESYREL) 25-50 MG tablet qhs prn, consider lunesta in the future vs benzo if not effective   Lower esophageal ring (Schatzki) s/p dilatation 08/30/20 repeat in 3 months per [facility] Dr. Haig Prophet Hiatal hernia Gastroesophageal reflux disease, unspecified whether esophagitis present Cc'ed gi protonix 40 mg bid is not helping stopped pepcid  F/u 11/2020 Dr. Mar[facility]   Elevated serum creatinine - Plan: bmet had today [facility] 09/28/20 rec 55-64 ounces water daily avoid nsaids  Osteoporosis, unspecified osteoporosis type, unspecified pathological fracture presence - Plan: [facility] Bone Density sch 03/2021 order in call to schedule Reclast infusion 11/2020   Estrogen deficiency - Plan: [facility] Bone Density  Personal history of fall x 2 but non since eval. For falls  Stress  There is therapy at  retirement community she can reach out to  HM utd flu shot prevnar utd pna 23 had 12/19/18 shingrix had 2/2  covid vx 3/3 utd consider booster if not had  Consider Tdap in future td had 04/17/16    EGD/colonoscopy had 02/2018 [facility] with erosive gastritis, small HH, gastric polpys, schzatski ring dilated 02/2018, diverticulosis, 3 mm colon polyp and IH  Esophagus was dilated -referred 04/28/20 Dr. 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 Olivia Mackie McLean-Scocuzza-Internal Medicine

## 2020-10-06 ENCOUNTER — Encounter: Payer: Self-pay | Admitting: Internal Medicine

## 2020-10-07 NOTE — Telephone Encounter (Signed)
For your information  

## 2020-11-04 ENCOUNTER — Ambulatory Visit (INDEPENDENT_AMBULATORY_CARE_PROVIDER_SITE_OTHER): Payer: Medicare HMO

## 2020-11-04 ENCOUNTER — Other Ambulatory Visit: Payer: Self-pay

## 2020-11-04 DIAGNOSIS — Z23 Encounter for immunization: Secondary | ICD-10-CM | POA: Diagnosis not present

## 2020-11-28 DIAGNOSIS — H2511 Age-related nuclear cataract, right eye: Secondary | ICD-10-CM | POA: Diagnosis not present

## 2020-11-28 DIAGNOSIS — H401110 Primary open-angle glaucoma, right eye, stage unspecified: Secondary | ICD-10-CM | POA: Diagnosis not present

## 2020-12-15 DIAGNOSIS — M810 Age-related osteoporosis without current pathological fracture: Secondary | ICD-10-CM | POA: Diagnosis not present

## 2020-12-16 ENCOUNTER — Encounter: Payer: Self-pay | Admitting: *Deleted

## 2020-12-19 ENCOUNTER — Encounter: Payer: Self-pay | Admitting: *Deleted

## 2020-12-19 ENCOUNTER — Ambulatory Visit: Payer: Medicare HMO | Admitting: Anesthesiology

## 2020-12-19 ENCOUNTER — Encounter
Admission: RE | Disposition: A | Discharge status: Home / Self Care | Payer: Self-pay | Source: Home / Self Care | Attending: Gastroenterology

## 2020-12-19 ENCOUNTER — Ambulatory Visit
Admission: RE | Admit: 2020-12-19 | Discharge: 2020-12-19 | Disposition: A | Discharge status: Home / Self Care | Payer: Medicare HMO | Attending: Gastroenterology | Admitting: Gastroenterology

## 2020-12-19 DIAGNOSIS — Z881 Allergy status to other antibiotic agents status: Secondary | ICD-10-CM | POA: Insufficient documentation

## 2020-12-19 DIAGNOSIS — Z880 Allergy status to penicillin: Secondary | ICD-10-CM | POA: Insufficient documentation

## 2020-12-19 DIAGNOSIS — Z888 Allergy status to other drugs, medicaments and biological substances status: Secondary | ICD-10-CM | POA: Insufficient documentation

## 2020-12-19 DIAGNOSIS — K222 Esophageal obstruction: Secondary | ICD-10-CM | POA: Diagnosis not present

## 2020-12-19 DIAGNOSIS — G4733 Obstructive sleep apnea (adult) (pediatric): Secondary | ICD-10-CM | POA: Diagnosis not present

## 2020-12-19 DIAGNOSIS — K209 Esophagitis, unspecified without bleeding: Secondary | ICD-10-CM | POA: Diagnosis not present

## 2020-12-19 DIAGNOSIS — K2100 Gastro-esophageal reflux disease with esophagitis, without bleeding: Secondary | ICD-10-CM | POA: Insufficient documentation

## 2020-12-19 DIAGNOSIS — K2270 Barrett's esophagus without dysplasia: Secondary | ICD-10-CM | POA: Diagnosis not present

## 2020-12-19 DIAGNOSIS — Z79899 Other long term (current) drug therapy: Secondary | ICD-10-CM | POA: Diagnosis not present

## 2020-12-19 DIAGNOSIS — K2289 Other specified disease of esophagus: Secondary | ICD-10-CM | POA: Diagnosis not present

## 2020-12-19 DIAGNOSIS — Z79890 Hormone replacement therapy (postmenopausal): Secondary | ICD-10-CM | POA: Diagnosis not present

## 2020-12-19 DIAGNOSIS — K317 Polyp of stomach and duodenum: Secondary | ICD-10-CM | POA: Diagnosis not present

## 2020-12-19 DIAGNOSIS — E039 Hypothyroidism, unspecified: Secondary | ICD-10-CM | POA: Insufficient documentation

## 2020-12-19 DIAGNOSIS — K449 Diaphragmatic hernia without obstruction or gangrene: Secondary | ICD-10-CM | POA: Insufficient documentation

## 2020-12-19 HISTORY — PX: ESOPHAGOGASTRODUODENOSCOPY (EGD) WITH PROPOFOL: SHX5813

## 2020-12-19 SURGERY — ESOPHAGOGASTRODUODENOSCOPY (EGD) WITH PROPOFOL
Anesthesia: General

## 2020-12-19 MED ORDER — LIDOCAINE HCL (CARDIAC) PF 100 MG/5ML IV SOSY
PREFILLED_SYRINGE | INTRAVENOUS | Status: DC | PRN
  Administered 2020-12-19: 50 mg via INTRAVENOUS

## 2020-12-19 MED ORDER — SODIUM CHLORIDE 0.9 % IV SOLN
INTRAVENOUS | Status: DC
  Administered 2020-12-19: 1000 mL via INTRAVENOUS

## 2020-12-19 MED ORDER — PROPOFOL 500 MG/50ML IV EMUL
INTRAVENOUS | Status: AC
  Filled 2020-12-19: qty 50

## 2020-12-19 MED ORDER — PROPOFOL 500 MG/50ML IV EMUL
INTRAVENOUS | Status: DC | PRN
  Administered 2020-12-19: 120 ug/kg/min via INTRAVENOUS

## 2020-12-19 MED ORDER — LIDOCAINE HCL (PF) 2 % IJ SOLN
INTRAMUSCULAR | Status: AC
  Filled 2020-12-19: qty 5

## 2020-12-19 NOTE — Interval H&P Note (Signed)
History and Physical Interval Note:  12/19/2020 8:36 AM  [name]  has presented today for surgery, with the diagnosis of Esophageal Stricture (K22.2).  The various methods of treatment have been discussed with the patient and family. After consideration of risks, benefits and other options for treatment, the patient has consented to  Procedure(s): ESOPHAGOGASTRODUODENOSCOPY (EGD) WITH PROPOFOL (N/A) as a surgical intervention.  The patient's history has been reviewed, patient examined, no change in status, stable for surgery.  I have reviewed the patient's chart and labs.  Questions were answered to the patient's satisfaction.     Dorothy Church  Ok to proceed with EGD

## 2020-12-19 NOTE — H&P (Signed)
Outpatient short stay form Pre-procedure 12/19/2020  Lesly Rubenstein, MD  Primary Physician: McLean-Scocuzza, Nino Glow, MD  Reason for visit:  Known stricture  History of present illness:   85 y/o lady with history of hypothyroidism and known esophageals stricture based on EGD done a few months ago. No blood thinners. No family history of GI malignancies. No new issues since last EGD.    Current Facility-Administered Medications:    0.9 %  sodium chloride infusion, , Intravenous, Continuous, Sallie Staron, Hilton Cork, MD, Last Rate: 20 mL/hr at 12/19/20 0813, Continued from Pre-op at 12/19/20 0813  Medications Prior to Admission  Medication Sig Dispense Refill Last Dose   acetaminophen (TYLENOL) 500 MG tablet Take 500 mg by mouth every 6 (six) hours as needed (PAIN).   12/18/2020   aspirin 325 MG tablet Take 325 mg by mouth daily.    Past Week   Azelastine HCl 0.15 % SOLN Place 1 spray into the nose 2 (two) times daily as needed. 30 mL 11 Past Week   budesonide-formoterol (SYMBICORT) 160-4.5 MCG/ACT inhaler Inhale 2 puffs into the lungs 2 (two) times daily. 3 each 10 12/19/2020 at 0615   calcium carbonate (TUMS - DOSED IN MG ELEMENTAL CALCIUM) 500 MG chewable tablet Chew 1 tablet by mouth 4 (four) times daily as needed for indigestion or heartburn.   Past Week   diclofenac sodium (VOLTAREN) 1 % GEL Apply 4 g topically 4 (four) times daily. (Patient taking differently: Apply 4 g topically 4 (four) times daily as needed (for pain).) 5 Tube 11 Past Week   fluticasone (FLONASE) 50 MCG/ACT nasal spray Place into both nostrils daily.   12/18/2020   hydrocortisone 2.5 % cream Apply topically 2 (two) times daily. Prn right lower chin 60 g 11 12/18/2020   latanoprost (XALATAN) 0.005 % ophthalmic solution    12/18/2020   levothyroxine (SYNTHROID) 50 MCG tablet TAKE 1 TABLET BY MOUTH EVERY DAY BEFORE BREAKFAST 90 tablet 3 12/19/2020 at 0615   loratadine (CLARITIN) 10 MG tablet Take 1 tablet (10 mg  total) by mouth daily as needed for allergies. 90 tablet 3 Past Week   mupirocin ointment (BACTROBAN) 2 % Apply 1 application topically 2 (two) times daily. Forehead wound 30 g 0 Past Week   pantoprazole (PROTONIX) 40 MG tablet Take 40 mg by mouth 2 (two) times daily before a meal.   12/18/2020   PARoxetine (PAXIL) 30 MG tablet TAKE 1 TABLET BY MOUTH EVERY MORNING 90 tablet 3 12/18/2020   Polyvinyl Alcohol-Povidone PF 1.4-0.6 % SOLN Place 2 drops into both eyes 2 (two) times daily as needed (for dry eyes).   12/18/2020   simvastatin (ZOCOR) 20 MG tablet TAKE 1 TABLET BY MOUTH EVERYDAY AT BEDTIME 90 tablet 3 12/18/2020   traZODone (DESYREL) 50 MG tablet Take 0.5-1 tablets (25-50 mg total) by mouth at bedtime as needed for sleep. 30 tablet 5 12/18/2020   albuterol (VENTOLIN HFA) 108 (90 Base) MCG/ACT inhaler Inhale 2 puffs into the lungs every 6 (six) hours as needed for wheezing. 18 g 5  at prn   RABEprazole (ACIPHEX) 20 MG tablet  (Patient not taking: Reported on 12/19/2020)   Not Taking     Allergies  Allergen Reactions   Amoxicillin Other (See Comments)    Pencillins   Cefzil [Cefprozil] Other (See Comments)    Unknown   Cephalexin Other (See Comments)    Unknown   Propoxyphene Other (See Comments)    Unknown   Relafen [Nabumetone] Other (See  Comments)    Unknown   Talwin [Pentazocine] Other (See Comments)    Unknown   Penicillins Other (See Comments)    Cannot remember reaction     Past Medical History:  Diagnosis Date   Abdominal pain    Abnormal CT of liver    Anxiety    Aortic stenosis    Aortic valve disorders    Arthritis    Asthma    Barrett's esophagus    [facility] last EGD 02/2018 Dr. Gustavo Lah    Colon polyps    COPD (chronic obstructive pulmonary disease) (HCC)    DDD (degenerative disc disease), cervical    Depressive disorder    Diverticulosis    Erosive esophagitis    Fundic gland polyps of stomach, benign    Gastric polyp    Gastric polyps    Gastritis     GERD (gastroesophageal reflux disease)    Goiter    Hiatal hernia    small    Hiatal hernia    Hypercholesteremia    Hypothyroidism    Impaired fasting glucose    Intrinsic asthma, unspecified    Left rib fracture    Obstructive sleep apnea (adult) (pediatric)    used cpap x 10 years per Dr. Mortimer Fries off as of 12/30/18    OSA (obstructive sleep apnea)    used cpap x 10 years per Dr. Mortimer Fries off as of 12/30/18    Osteoporosis    Pneumonia    Pure hypercholesterolemia    Schatzki's ring    Schatzki's ring    Shingles    Syncope and collapse    TIA (transient ischemic attack)    UTI (urinary tract infection)    Vitamin D deficiency     Review of systems:  Otherwise negative.    Physical Exam  Gen: Alert, oriented. Appears stated age.  HEENT: PERRLA. Lungs: No respiratory distress CV: RRR Abd: soft, benign, no masses Ext: No edema    Planned procedures: Proceed with EGD. The patient understands the nature of the planned procedure, indications, risks, alternatives and potential complications including but not limited to bleeding, infection, perforation, damage to internal organs and possible oversedation/side effects from anesthesia. The patient agrees and gives consent to proceed.  Please refer to procedure notes for findings, recommendations and patient disposition/instructions.     Lesly Rubenstein, MD [facility]

## 2020-12-19 NOTE — Anesthesia Procedure Notes (Signed)
Date/Time: 12/19/2020 8:48 AM Performed by: Vaughan Sine Pre-anesthesia Checklist: Patient identified, Emergency Drugs available, Suction available, Patient being monitored and Timeout performed Patient Re-evaluated:Patient Re-evaluated prior to induction Oxygen Delivery Method: Nasal cannula Preoxygenation: Pre-oxygenation with 100% oxygen Induction Type: IV induction Airway Equipment and Method: Bite block Placement Confirmation: positive ETCO2 and CO2 detector

## 2020-12-19 NOTE — Anesthesia Postprocedure Evaluation (Signed)
Anesthesia Post Note  Patient: Dorothy Church  Procedure(s) Performed: ESOPHAGOGASTRODUODENOSCOPY (EGD) WITH PROPOFOL  Patient location during evaluation: Endoscopy Anesthesia Type: General Level of consciousness: awake and alert and oriented Pain management: pain level controlled Vital Signs Assessment: post-procedure vital signs reviewed and stable Respiratory status: spontaneous breathing, nonlabored ventilation and respiratory function stable Cardiovascular status: blood pressure returned to baseline and stable Postop Assessment: no signs of nausea or vomiting Anesthetic complications: no   No notable events documented.   Last Vitals:  Vitals:   12/19/20 0905 12/19/20 0915  BP: 114/68 126/71  Pulse: 72 76  Resp: 16 14  Temp:    SpO2: 98% 100%    Last Pain:  Vitals:   12/19/20 0915  TempSrc:   PainSc: 0-No pain                 Lenwood Balsam

## 2020-12-19 NOTE — Op Note (Signed)
[facility] Patient Name: Dorothy Church Procedure Date: 12/19/2020 8:24 AM MRN: [id number] Account #: [id number] Date of Birth: May 29, 1935 Admit Type: Outpatient Age: 85 Room: [facility] ENDO ROOM 3 Gender: Female Note Status: Finalized Instrument Name: Altamese Cabal Endoscope [id number] Procedure:             Upper GI endoscopy Indications:           Stricture of the esophagus Providers:             Andrey Farmer MD, MD Medicines:             Monitored Anesthesia Care Complications:         No immediate complications. Estimated blood loss:                         Minimal. Procedure:             Pre-Anesthesia Assessment:                        - Prior to the procedure, a History and Physical was                         performed, and patient medications and allergies were                         reviewed. The patient is competent. The risks and                         benefits of the procedure and the sedation options and                         risks were discussed with the patient. All questions                         were answered and informed consent was obtained.                         Patient identification and proposed procedure were                         verified by the physician, the nurse, the anesthetist                         and the technician in the endoscopy suite. Mental                         Status Examination: alert and oriented. Airway                         Examination: normal oropharyngeal airway and neck                         mobility. Respiratory Examination: clear to                         auscultation. CV Examination: normal. Prophylactic                         Antibiotics: The patient does not require prophylactic  antibiotics. Prior Anticoagulants: The patient has                         taken no previous anticoagulant or antiplatelet                         agents. ASA Grade Assessment: II - A  patient with mild                         systemic disease. After reviewing the risks and                         benefits, the patient was deemed in satisfactory                         condition to undergo the procedure. The anesthesia                         plan was to use monitored anesthesia care (MAC).                         Immediately prior to administration of medications,                         the patient was re-assessed for adequacy to receive                         sedatives. The heart rate, respiratory rate, oxygen                         saturations, blood pressure, adequacy of pulmonary                         ventilation, and response to care were monitored                         throughout the procedure. The physical status of the                         patient was re-assessed after the procedure.                        After obtaining informed consent, the endoscope was                         passed under direct vision. Throughout the procedure,                         the patient's blood pressure, pulse, and oxygen                         saturations were monitored continuously. The Endoscope                         was introduced through the mouth, and advanced to the                         second part of duodenum. The upper GI endoscopy was  accomplished without difficulty. The patient tolerated                         the procedure well. Findings:      One benign-appearing, intrinsic mild stenosis was found. This stenosis       measured less than one cm (in length). The stenosis was traversed. A TTS       dilator was passed through the scope. Dilation with a 15-16.5-18 mm       balloon dilator was performed to 18 mm. The dilation site was examined       and showed mild mucosal disruption. Estimated blood loss was minimal.      LA Grade B (one or more mucosal breaks greater than 5 mm, not extending       between the tops of two mucosal  folds) esophagitis with no bleeding was       found.      Salmon-colored mucosa was present. The maximum longitudinal extent of       these esophageal mucosal changes was 2 cm in length. Biopsies were taken       with a cold forceps for histology. Estimated blood loss was minimal.      A small hiatal hernia was present.      Multiple small sessile fundic gland polyps with no bleeding and no       stigmata of recent bleeding were found in the gastric body.      The examined duodenum was normal. Impression:            - Benign-appearing esophageal stenosis. Dilated.                        - LA Grade B reflux esophagitis with no bleeding.                        - Salmon-colored mucosa. Biopsied.                        - Small hiatal hernia.                        - Multiple fundic gland polyps.                        - Normal examined duodenum. Recommendation:        - Discharge patient to home.                        - Resume previous diet.                        - Continue present medications.                        - Await pathology results.                        - Return to referring physician as previously                         scheduled. Procedure Code(s):     --- Professional ---  [phone], Esophagogastroduodenoscopy, flexible,                         transoral; with transendoscopic balloon dilation of                         esophagus (less than 30 mm diameter) Diagnosis Code(s):     --- Professional ---                        K22.2, Esophageal obstruction                        K21.00, Gastro-esophageal reflux disease with                         esophagitis, without bleeding                        K22.8, Other specified diseases of esophagus                        K44.9, Diaphragmatic hernia without obstruction or                         gangrene                        K31.7, Polyp of stomach and duodenum CPT copyright 2019 American Medical Association.  All rights reserved. The codes documented in this report are preliminary and upon coder review may  be revised to meet current compliance requirements. Andrey Farmer MD, MD 12/19/2020 8:59:54 AM Number of Addenda: 0 Note Initiated On: 12/19/2020 8:24 AM Estimated Blood Loss:  Estimated blood loss was minimal.      [facility]

## 2020-12-19 NOTE — Transfer of Care (Signed)
Immediate Anesthesia Transfer of Care Note  Patient: LYRICK WORLAND  Procedure(s) Performed: ESOPHAGOGASTRODUODENOSCOPY (EGD) WITH PROPOFOL  Patient Location: PACU  Anesthesia Type:General  Level of Consciousness: awake and sedated  Airway & Oxygen Therapy: Patient Spontanous Breathing and Patient connected to nasal cannula oxygen  Post-op Assessment: Report given to RN and Post -op Vital signs reviewed and stable  Post vital signs: Reviewed and stable  Last Vitals:  Vitals Value Taken Time  BP    Temp    Pulse    Resp    SpO2      Last Pain:  Vitals:   12/19/20 0742  TempSrc: Temporal  PainSc: 0-No pain         Complications: No notable events documented.

## 2020-12-19 NOTE — Anesthesia Preprocedure Evaluation (Signed)
Anesthesia Evaluation  Patient identified by MRN, date of birth, ID band Patient awake    Reviewed: Allergy & Precautions, NPO status , Patient's Chart, lab work & pertinent test results  History of Anesthesia Complications Negative for: history of anesthetic complications  Airway Mallampati: II  TM Distance: >3 FB Neck ROM: Full    Dental no notable dental hx.    Pulmonary asthma , COPD,  COPD inhaler,    breath sounds clear to auscultation- rhonchi (-) wheezing      Cardiovascular Exercise Tolerance: Good (-) hypertension(-) CAD, (-) Past MI, (-) Cardiac Stents and (-) CABG  Rhythm:Regular Rate:Normal - Systolic murmurs and - Diastolic murmurs    Neuro/Psych neg Seizures PSYCHIATRIC DISORDERS Anxiety Depression TIA   GI/Hepatic Neg liver ROS, hiatal hernia, PUD, GERD  ,  Endo/Other  neg diabetesHypothyroidism   Renal/GU negative Renal ROS     Musculoskeletal  (+) Arthritis ,   Abdominal (+) - obese,   Peds  Hematology negative hematology ROS (+)   Anesthesia Other Findings Past Medical History: No date: Abdominal pain No date: Abnormal CT of liver No date: Anxiety No date: Aortic stenosis No date: Aortic valve disorders No date: Arthritis No date: Asthma No date: Barrett's esophagus     Comment:  [facility] last EGD 02/2018 Dr. Gustavo Lah  No date: Colon polyps No date: COPD (chronic obstructive pulmonary disease) (HCC) No date: DDD (degenerative disc disease), cervical No date: Depressive disorder No date: Diverticulosis No date: Erosive esophagitis No date: Fundic gland polyps of stomach, benign No date: Gastric polyp No date: Gastric polyps No date: Gastritis No date: GERD (gastroesophageal reflux disease) No date: Goiter No date: Hiatal hernia     Comment:  small  No date: Hiatal hernia No date: Hypercholesteremia No date: Hypothyroidism No date: Impaired fasting glucose No date: Intrinsic asthma,  unspecified No date: Left rib fracture No date: Obstructive sleep apnea (adult) (pediatric)     Comment:  used cpap x 10 years per Dr. Mortimer Fries off as of 12/30/18  No date: OSA (obstructive sleep apnea)     Comment:  used cpap x 10 years per Dr. Mortimer Fries off as of 12/30/18  No date: Osteoporosis No date: Pneumonia No date: Pure hypercholesterolemia No date: Schatzki's ring No date: Schatzki's ring No date: Shingles No date: Syncope and collapse No date: TIA (transient ischemic attack) No date: UTI (urinary tract infection) No date: Vitamin D deficiency   Reproductive/Obstetrics                            Anesthesia Physical Anesthesia Plan  ASA: 3  Anesthesia Plan: General   Post-op Pain Management:    Induction: Intravenous  PONV Risk Score and Plan: 2 and Propofol infusion  Airway Management Planned: Natural Airway  Additional Equipment:   Intra-op Plan:   Post-operative Plan:   Informed Consent: I have reviewed the patients History and Physical, chart, labs and discussed the procedure including the risks, benefits and alternatives for the proposed anesthesia with the patient or authorized representative who has indicated his/her understanding and acceptance.     Dental advisory given  Plan Discussed with: CRNA and Anesthesiologist  Anesthesia Plan Comments:         Anesthesia Quick Evaluation

## 2020-12-20 ENCOUNTER — Encounter: Payer: Self-pay | Admitting: Gastroenterology

## 2020-12-20 LAB — SURGICAL PATHOLOGY

## 2021-01-22 ENCOUNTER — Other Ambulatory Visit: Payer: Self-pay | Admitting: Internal Medicine

## 2021-02-14 ENCOUNTER — Encounter: Payer: Self-pay | Admitting: Internal Medicine

## 2021-02-14 NOTE — Telephone Encounter (Signed)
Patient last seen 09/28/20. Okay to send in?

## 2021-02-15 ENCOUNTER — Other Ambulatory Visit: Payer: Self-pay | Admitting: Internal Medicine

## 2021-02-15 NOTE — Telephone Encounter (Signed)
For your information  

## 2021-02-17 DIAGNOSIS — L821 Other seborrheic keratosis: Secondary | ICD-10-CM | POA: Diagnosis not present

## 2021-02-17 DIAGNOSIS — L814 Other melanin hyperpigmentation: Secondary | ICD-10-CM | POA: Diagnosis not present

## 2021-03-10 ENCOUNTER — Other Ambulatory Visit: Payer: Self-pay

## 2021-03-10 ENCOUNTER — Telehealth: Payer: Self-pay | Admitting: Primary Care

## 2021-03-10 ENCOUNTER — Ambulatory Visit: Payer: Medicare HMO | Admitting: Primary Care

## 2021-03-10 ENCOUNTER — Encounter: Payer: Self-pay | Admitting: Primary Care

## 2021-03-10 DIAGNOSIS — J4520 Mild intermittent asthma, uncomplicated: Secondary | ICD-10-CM | POA: Diagnosis not present

## 2021-03-10 DIAGNOSIS — J309 Allergic rhinitis, unspecified: Secondary | ICD-10-CM

## 2021-03-10 DIAGNOSIS — J4550 Severe persistent asthma, uncomplicated: Secondary | ICD-10-CM | POA: Diagnosis not present

## 2021-03-10 MED ORDER — BUDESONIDE-FORMOTEROL FUMARATE 160-4.5 MCG/ACT IN AERO: 2.0000 | INHALATION_SPRAY | Freq: Two times a day (BID) | RESPIRATORY_TRACT | 11 refills | Status: DC

## 2021-03-10 MED ORDER — MONTELUKAST SODIUM 10 MG PO TABS: 10.0000 mg | ORAL_TABLET | Freq: Every day | ORAL | 3 refills | Status: DC

## 2021-03-10 MED ORDER — LORATADINE 10 MG PO TABS: 10.0000 mg | ORAL_TABLET | Freq: Every day | ORAL | 3 refills | Status: DC | PRN

## 2021-03-10 MED ORDER — AZELASTINE HCL 0.15 % NA SOLN: 1.0000 | Freq: Two times a day (BID) | NASAL | 3 refills | Status: DC | PRN

## 2021-03-10 NOTE — Assessment & Plan Note (Addendum)
-   Stable; No recent exacerbations, rare SABA use. She experience dyspnea with exercise, intermittent wheezing. She is only using Symbicort once a day d.t cost. We will look into cheaper alternatives with her insurance. For now recommend she use one puff Symbicort 160 every 12 hours for more consistent control.   - PFT's  08/16/14 FEV1 1.46 (62 % ) ratio 62  p 56  % improvement from saba p ?  prior to study with DLCO  66 % corrects to 82  % for alv volume   - Allergy profile 05/11/2016 >  Eos 0.2 /  IgE  43  Pos Dust > dog/ cat and ragweed

## 2021-03-10 NOTE — Assessment & Plan Note (Addendum)
-   Continue Claritin 10mg  daily and Flonase as prescribed  - Advised she resume Astelin nasal spray 1-2 puffs twice daily prn PND and start Singulair 10mg  at bedtime for asthma/allergy symptoms

## 2021-03-10 NOTE — Progress Notes (Signed)
@Patient  ID: Dorothy Church, female    DOB: 12/10/1935, 86 y.o.   MRN: [id number]  Chief Complaint  Patient presents with   Follow-up    Referring provider: McLean-Scocuzza, Olivia Mackie *  HPI: 86 year old female, never smoked. PMH significant for mild intermittent asthma. Patient of Dr. Mortimer Fries, last seen on 03/10/20. Maintained on Symbicort 160, Claritin, flonase and prn Albuterol.   03/10/2021- Interim hx  Patient presents today for 1 year follow-up/ asthma. She is doing well today. No acute complaints. Her breathing is unchanged in the last year. She gets winded with exertion. She experiences occasional wheezing. She has a lot of associated sinus/post nasal drip symptoms.  She is compliant with Symbicort 160, she is only using inhaler in the morning d/t cost of medication. She typically only requires her albuterol rescue inhaler twice a month. No significant cough or chest tightness.  Allergies  Allergen Reactions   Amoxicillin Other (See Comments)    Pencillins   Cefzil [Cefprozil] Other (See Comments)    Unknown   Cephalexin Other (See Comments)    Unknown   Propoxyphene Other (See Comments)    Unknown   Relafen [Nabumetone] Other (See Comments)    Unknown   Talwin [Pentazocine] Other (See Comments)    Unknown   Penicillins Other (See Comments)    Cannot remember reaction    Immunization History  Administered Date(s) Administered   Fluad Quad(high Dose 65+) 12/05/2019, 11/04/2020   Influenza Whole 12/14/2010   Influenza, High Dose Seasonal PF 12/02/2017, 12/08/2018   Influenza,inj,Quad PF,6+ Mos 11/10/2012, 11/05/2013, 11/10/2014, 10/17/2015, 10/24/2016   Influenza-Unspecified 12/04/2011, 11/10/2012, 11/05/2013, 12/07/2018   Moderna Sars-Covid-2 Vaccination 03/03/2019, 03/31/2019, 11/30/2019   Pneumococcal Conjugate-13 10/07/2009, 04/23/2013, 11/24/2013   Pneumococcal Polysaccharide-23 11/13/2009, 12/19/2018   Td 04/17/2016   Zoster Recombinat (Shingrix) 04/09/2018,  09/15/2018   Zoster, Live 05/08/2007    Past Medical History:  Diagnosis Date   Abdominal pain    Abnormal CT of liver    Anxiety    Aortic stenosis    Aortic valve disorders    Arthritis    Asthma    Barrett's esophagus    [facility] last EGD 02/2018 Dr. Gustavo Lah    Colon polyps    COPD (chronic obstructive pulmonary disease) ([place])    DDD (degenerative disc disease), cervical    Depressive disorder    Diverticulosis    Erosive esophagitis    Fundic gland polyps of stomach, benign    Gastric polyp    Gastric polyps    Gastritis    GERD (gastroesophageal reflux disease)    Goiter    Hiatal hernia    small    Hiatal hernia    Hypercholesteremia    Hypothyroidism    Impaired fasting glucose    Intrinsic asthma, unspecified    Left rib fracture    Obstructive sleep apnea (adult) (pediatric)    used cpap x 10 years per Dr. Mortimer Fries off as of 12/30/18    OSA (obstructive sleep apnea)    used cpap x 10 years per Dr. Mortimer Fries off as of 12/30/18    Osteoporosis    Pneumonia    Pure hypercholesterolemia    Schatzki's ring    Schatzki's ring    Shingles    Syncope and collapse    TIA (transient ischemic attack)    UTI (urinary tract infection)    Vitamin D deficiency     Tobacco History: Social History   Tobacco Use  Smoking Status Never  Smokeless Tobacco Never   Counseling given: Not Answered   Outpatient Medications Prior to Visit  Medication Sig Dispense Refill   acetaminophen (TYLENOL) 500 MG tablet Take 500 mg by mouth every 6 (six) hours as needed (PAIN).     albuterol (VENTOLIN HFA) 108 (90 Base) MCG/ACT inhaler Inhale 2 puffs into the lungs every 6 (six) hours as needed for wheezing. 18 g 5   aspirin 325 MG tablet Take 325 mg by mouth daily.      calcium carbonate (TUMS - DOSED IN MG ELEMENTAL CALCIUM) 500 MG chewable tablet Chew 1 tablet by mouth 4 (four) times daily as needed for indigestion or heartburn.     diclofenac sodium (VOLTAREN) 1 % GEL Apply 4 g  topically 4 (four) times daily. (Patient taking differently: Apply 4 g topically 4 (four) times daily as needed (for pain).) 5 Tube 11   fluticasone (FLONASE) 50 MCG/ACT nasal spray Place into both nostrils daily.     hydrocortisone 2.5 % cream Apply topically 2 (two) times daily. Prn right lower chin 60 g 11   latanoprost (XALATAN) 0.005 % ophthalmic solution      levothyroxine (SYNTHROID) 50 MCG tablet TAKE 1 TABLET BY MOUTH EVERY DAY BEFORE BREAKFAST 90 tablet 3   pantoprazole (PROTONIX) 40 MG tablet Take 40 mg by mouth 2 (two) times daily before a meal.     PARoxetine (PAXIL) 30 MG tablet TAKE 1 TABLET BY MOUTH EVERY MORNING 90 tablet 3   Polyvinyl Alcohol-Povidone PF 1.4-0.6 % SOLN Place 2 drops into both eyes 2 (two) times daily as needed (for dry eyes).     RABEprazole (ACIPHEX) 20 MG tablet  (Patient not taking: Reported on 12/19/2020)     simvastatin (ZOCOR) 20 MG tablet TAKE 1 TABLET BY MOUTH EVERYDAY AT BEDTIME 90 tablet 3   traZODone (DESYREL) 50 MG tablet Take 0.5-1 tablets (25-50 mg total) by mouth at bedtime as needed for sleep. 30 tablet 5   Azelastine HCl 0.15 % SOLN Place 1 spray into the nose 2 (two) times daily as needed. 30 mL 11   budesonide-formoterol (SYMBICORT) 160-4.5 MCG/ACT inhaler Inhale 2 puffs into the lungs 2 (two) times daily. 3 each 10   loratadine (CLARITIN) 10 MG tablet Take 1 tablet (10 mg total) by mouth daily as needed for allergies. 90 tablet 3   mupirocin ointment (BACTROBAN) 2 % Apply 1 application topically 2 (two) times daily. Forehead wound 30 g 0   No facility-administered medications prior to visit.    Review of Systems  Review of Systems  Constitutional: Negative.   HENT:  Positive for postnasal drip.   Respiratory:  Positive for shortness of breath and wheezing. Negative for cough and chest tightness.     Physical Exam  BP 120/80 (BP Location: Left Arm, Patient Position: Sitting, Cuff Size: Normal)    Pulse 89    Temp (!) 97.1 F (36.2 C)  (Oral)    Ht 5\' 6"  (1.676 m)    Wt 137 lb 9.6 oz (62.4 kg)    SpO2 97%    BMI 22.21 kg/m  Physical Exam Constitutional:      Appearance: Normal appearance.  HENT:     Head: Normocephalic and atraumatic.     Mouth/Throat:     Mouth: Mucous membranes are moist.     Pharynx: Oropharynx is clear.  Cardiovascular:     Rate and Rhythm: Normal rate and regular rhythm.  Pulmonary:     Effort: Pulmonary effort is normal.  Breath sounds: Normal breath sounds. No wheezing, rhonchi or rales.  Musculoskeletal:        General: Normal range of motion.  Skin:    General: Skin is warm and dry.  Neurological:     General: No focal deficit present.     Mental Status: She is alert and oriented to person, place, and time. Mental status is at baseline.  Psychiatric:        Mood and Affect: Mood normal.        Behavior: Behavior normal.        Thought Content: Thought content normal.        Judgment: Judgment normal.     Lab Results:  CBC    Component Value Date/Time   WBC 5.3 04/28/2020 1449   RBC 4.55 04/28/2020 1449   HGB 13.8 04/28/2020 1449   HGB 14.0 03/01/2014 1053   HCT 40.2 04/28/2020 1449   HCT 42.6 03/01/2014 1053   PLT 222.0 04/28/2020 1449   PLT 190 03/01/2014 1053   MCV 88.3 04/28/2020 1449   MCV 91 03/01/2014 1053   MCH 30.3 10/17/2018 0804   MCHC 34.3 04/28/2020 1449   RDW 13.1 04/28/2020 1449   RDW 13.3 03/01/2014 1053   LYMPHSABS 1.2 04/28/2020 1449   MONOABS 0.5 04/28/2020 1449   EOSABS 0.4 04/28/2020 1449   BASOSABS 0.0 04/28/2020 1449    BMET    Component Value Date/Time   NA 141 04/28/2020 1449   NA 144 03/01/2014 1053   K 3.9 04/28/2020 1449   K 3.9 03/01/2014 1053   CL 102 04/28/2020 1449   CL 108 (H) 03/01/2014 1053   CO2 31 04/28/2020 1449   CO2 30 03/01/2014 1053   GLUCOSE 82 04/28/2020 1449   GLUCOSE 90 03/01/2014 1053   BUN 16 04/28/2020 1449   BUN 10 03/01/2014 1053   CREATININE 1.01 04/28/2020 1449   CREATININE 0.75 03/01/2014 1053    CALCIUM 9.4 04/28/2020 1449   CALCIUM 8.3 (L) 03/01/2014 1053   GFRNONAA >60 10/17/2018 0804   GFRNONAA >60 03/01/2014 1053   GFRAA >60 10/17/2018 0804   GFRAA >60 03/01/2014 1053    BNP No results found for: BNP  ProBNP No results found for: PROBNP  Imaging: No results found.   Assessment & Plan:   Asthma, chronic, severe persistent, uncomplicated - Stable; No recent exacerbations, rare SABA use. She experience dyspnea with exercise, intermittent wheezing. She is only using Symbicort once a day d.t cost. We will look into cheaper alternatives with her insurance. For now recommend she use one puff Symbicort 160 every 12 hours for more consistent control.   - PFT's  08/16/14 FEV1 1.46 (62 % ) ratio 62  p 56  % improvement from saba p ?  prior to study with DLCO  66 % corrects to 82  % for alv volume   - Allergy profile 05/11/2016 >  Eos 0.2 /  IgE  43  Pos Dust > dog/ cat and ragweed  Allergic rhinitis - Continue Claritin 10mg  daily and Flonase as prescribed  - Advised she resume Astelin nasal spray 1-2 puffs twice daily prn PND and start Singulair 10mg  at bedtime for asthma/allergy symptoms   Martyn Ehrich, NP 03/10/2021

## 2021-03-10 NOTE — Patient Instructions (Addendum)
Recommendations: - Add Singulair 10mg  at bedtime (for asthma/alelrgies- daily medication) - Start Astelin nasal spray 1 puff per nostril twice daily as needed for PND (if dryness occurs change to every other day or only use as needed) - Continue Symbicort 1-2 puffs morning and evening  - Use albuterol 2 puffs every 6 hours for breakthrough shortness of breath  - Continue flonase daily 1 puff per nostril once daily  - Continue Claritin (loratadine) 10mg  daily   Follow-up: - 1 year with Dr. Mortimer Fries or sooner if needed   Asthma, Adult Asthma is a long-term (chronic) condition in which the airways get tight and narrow. The airways are the breathing passages that lead from the nose and mouth down into the lungs. A person with asthma will have times when symptoms get worse. These are called asthma attacks. They can cause coughing, whistling sounds when you breathe (wheezing), shortness of breath, and chest pain. They can make it hard to breathe. There is no cure for asthma, but medicines and lifestyle changes can help control it. There are many things that can bring on an asthma attack or make asthma symptoms worse (triggers). Common triggers include: Mold. Dust. Cigarette smoke. Cockroaches. Things that can cause allergy symptoms (allergens). These include animal skin flakes (dander) and pollen from trees or grass. Things that pollute the air. These may include household cleaners, wood smoke, smog, or chemical odors. Cold air, weather changes, and wind. Crying or laughing hard. Stress. Certain medicines or drugs. Certain foods such as dried fruit, potato chips, and grape juice. Infections, such as a cold or the flu. Certain medical conditions or diseases. Exercise or tiring activities. Asthma may be treated with medicines and by staying away from the things that cause asthma attacks. Types of medicines may include: Controller medicines. These help prevent asthma symptoms. They are usually taken  every day. Fast-acting reliever or rescue medicines. These quickly relieve asthma symptoms. They are used as needed and provide short-term relief. Allergy medicines if your attacks are brought on by allergens. Medicines to help control the body's defense (immune) system. Follow these instructions at home: Avoiding triggers in your home Change your heating and air conditioning filter often. Limit your use of fireplaces and wood stoves. Get rid of pests (such as roaches and mice) and their droppings. Throw away plants if you see mold on them. Clean your floors. Dust regularly. Use cleaning products that do not smell. Have someone vacuum when you are not home. Use a vacuum cleaner with a HEPA filter if possible. Replace carpet with wood, tile, or vinyl flooring. Carpet can trap animal skin flakes and dust. Use allergy-proof pillows, mattress covers, and box spring covers. Wash bed sheets and blankets every week in hot water. Dry them in a dryer. Keep your bedroom free of any triggers. Avoid pets and keep windows closed when things that cause allergy symptoms are in the air. Use blankets that are made of polyester or cotton. Clean bathrooms and kitchens with bleach. If possible, have someone repaint the walls in these rooms with mold-resistant paint. Keep out of the rooms that are being cleaned and painted. Wash your hands often with soap and water. If soap and water are not available, use hand sanitizer. Do not allow anyone to smoke in your home. General instructions Take over-the-counter and prescription medicines only as told by your doctor. Talk with your doctor if you have questions about how or when to take your medicines. Make note if you need to use  your medicines more often than usual. Do not use any products that contain nicotine or tobacco, such as cigarettes and e-cigarettes. If you need help quitting, ask your doctor. Stay away from secondhand smoke. Avoid doing things outdoors  when allergen counts are high and when air quality is low. Wear a ski mask when doing outdoor activities in the winter. The mask should cover your nose and mouth. Exercise indoors on cold days if you can. Warm up before you exercise. Take time to cool down after exercise. Use a peak flow meter as told by your doctor. A peak flow meter is a tool that measures how well the lungs are working. Keep track of the peak flow meter's readings. Write them down. Follow your asthma action plan. This is a written plan for taking care of your asthma and treating your attacks. Make sure you get all the shots (vaccines) that your doctor recommends. Ask your doctor about a flu shot and a pneumonia shot. Keep all follow-up visits as told by your doctor. This is important. Contact a doctor if: You have wheezing, shortness of breath, or a cough even while taking medicine to prevent attacks. The mucus you cough up (sputum) is thicker than usual. The mucus you cough up changes from clear or white to yellow, green, gray, or bloody. You have problems from the medicine you are taking, such as: A rash. Itching. Swelling. Trouble breathing. You need reliever medicines more than 2-3 times a week. Your peak flow reading is still at 50-79% of your personal best after following the action plan for 1 hour. You have a fever. Get help right away if: You seem to be worse and are not responding to medicine during an asthma attack. You are short of breath even at rest. You get short of breath when doing very little activity. You have trouble eating, drinking, or talking. You have chest pain or tightness. You have a fast heartbeat. Your lips or fingernails start to turn blue. You are light-headed or dizzy, or you faint. Your peak flow is less than 50% of your personal best. You feel too tired to breathe normally. Summary Asthma is a long-term (chronic) condition in which the airways get tight and narrow. An asthma attack  can make it hard to breathe. Asthma cannot be cured, but medicines and lifestyle changes can help control it. Make sure you understand how to avoid triggers and how and when to use your medicines. This information is not intended to replace advice given to you by your health care provider. Make sure you discuss any questions you have with your health care provider. Document Revised: 05/31/2019 Document Reviewed: 06/10/2019 Elsevier Patient Education  [employer].    Montelukast Tablets What is this medication? MONTELUKAST (mon te LOO kast) prevents and treats the symptoms of asthma and allergies. It works by decreasing inflammation in the airways, making it easier to breathe. Do not use this medication to treat a sudden asthma attack. This medicine may be used for other purposes; ask your health care provider or pharmacist if you have questions. COMMON BRAND NAME(S): Singulair What should I tell my care team before I take this medication? They need to know if you have any of these conditions: Liver disease An unusual or allergic reaction to montelukast, other medications, foods, dyes, or preservatives Pregnant or trying to get pregnant Breast-feeding How should I use this medication? Take this medication by mouth with water. Take it as directed on the prescription label at  the same time every day. You can take this medication with or without food. If it upsets your stomach, take it with food. Keep taking it unless your care team tells you to stop. A special MedGuide will be given to you by the pharmacist with each prescription and refill. Be sure to read this information carefully each time. Talk to your care team about the use of this medication in children. While this medication may be prescribed for children as young as 15 years for selected conditions, precautions do apply. Overdosage: If you think you have taken too much of this medicine contact a poison control center or  emergency room at once. NOTE: This medicine is only for you. Do not share this medicine with others. What if I miss a dose? If you miss a dose, skip it. Take your next dose at the normal time. Do not take extra or 2 doses at the same time to make up for the missed dose. What may interact with this medication? Medications for seizures like phenytoin, phenobarbital, and carbamazepine Rifabutin Rifampin This list may not describe all possible interactions. Give your health care provider a list of all the medicines, herbs, non-prescription drugs, or dietary supplements you use. Also tell them if you smoke, drink alcohol, or use illegal drugs. Some items may interact with your medicine. What should I watch for while using this medication? Visit your health care provider for regular checks on your progress. Tell your health care provider if your allergy or asthma symptoms do not improve. Take your medication even when you do not have symptoms. If you have asthma, talk to your health care provider about what to do in an acute asthma attack. Always have your rescue medication for asthma attacks with you. Patients and their families should watch for new or worsening thoughts of suicide or depression. Also watch for sudden changes in feelings such as feeling anxious, agitated, panicky, irritable, hostile, aggressive, impulsive, severely restless, overly excited and hyperactive, or not being able to sleep. Any worsening of mood or thoughts of suicide or dying should be reported to your health care provider right away. What side effects may I notice from receiving this medication? Side effects that you should report to your care team as soon as possible: Allergic reactions--skin rash, itching, hives, swelling of the face, lips, tongue, or throat Flu-like symptoms--fever, chills, muscle pain, cough, headache, fatigue Mood and behavior changes such as anxiety, nervousness, confusion, hallucinations, irritability,  hostility, thoughts of suicide or self-harm, worsening mood, feelings of depression Pain, tingling, or numbness in the hands or feet Sinus pain or pressure around the face or forehead Trouble sleeping Vivid dreams or nightmares Side effects that usually do not require medical attention (report to your care team if they continue or are bothersome): Cough Diarrhea Headache Runny or stuffy nose Sore throat Stomach pain This list may not describe all possible side effects. Call your doctor for medical advice about side effects. You may report side effects to FDA at 1-800-FDA-1088. Where should I keep my medication? Keep out of the reach of children and pets. Store at room temperature between 15 and 30 degrees C (59 and 86 degrees F). Protect from light and moisture. Protect from light and moisture. Keep the container tightly closed. Get rid of any unused medication after the expiration date. To get rid of medications that are no longer needed or expired: Take the medication to a medication take-back program. Check with your pharmacy or law enforcement to find  a location. If you cannot return the medication, check the label or package insert to see if the medication should be thrown out in the garbage or flushed down the toilet. If you are not sure, ask your care team. If it is safe to put in the trash, empty the medication out of the container. Mix the medication with cat litter, dirt, coffee grounds, or other unwanted substance. Seal the mixture in a bag or container. Put it in the trash. NOTE: This sheet is a summary. It may not cover all possible information. If you have questions about this medicine, talk to your doctor, pharmacist, or health care provider.  2022 Elsevier/Gold Standard (2020-03-04 00:00:00)

## 2021-03-10 NOTE — Telephone Encounter (Signed)
What high dose ICS/LABA is formulary on patients plan. Patient is on symb 160 but patient states that it is expensive

## 2021-03-13 ENCOUNTER — Other Ambulatory Visit (HOSPITAL_COMMUNITY): Payer: Self-pay

## 2021-03-13 NOTE — Telephone Encounter (Signed)
Routing to prior auth team to run test claims for:   Advair Diskus Advair HFA Airduo Dulera Breo Ellipta  Knox Saliva, PharmD, MPH, BCPS Clinical Pharmacist (Rheumatology and Pulmonology)

## 2021-03-13 NOTE — Telephone Encounter (Signed)
Attempted to call pt but unable to reach. Left message for her to return call. 

## 2021-03-13 NOTE — Telephone Encounter (Signed)
Please let patient know she would need prior auth for a different inhaler. If her symptoms are well controled on 1 puff morning and evening would stay on Symbicort. Otherwise we can try a difference inhaler called Dulera.

## 2021-03-16 NOTE — Telephone Encounter (Signed)
Called and spoke with pt letting her know the info per BW and pharmacy team and she verbalized understanding stating that she will stay on Symbicort. Nothing further needed.

## 2021-03-21 DIAGNOSIS — K2100 Gastro-esophageal reflux disease with esophagitis, without bleeding: Secondary | ICD-10-CM | POA: Diagnosis not present

## 2021-03-27 DIAGNOSIS — H2511 Age-related nuclear cataract, right eye: Secondary | ICD-10-CM | POA: Diagnosis not present

## 2021-03-29 DIAGNOSIS — H524 Presbyopia: Secondary | ICD-10-CM | POA: Diagnosis not present

## 2021-03-30 DIAGNOSIS — Z0100 Encounter for examination of eyes and vision without abnormal findings: Secondary | ICD-10-CM | POA: Diagnosis not present

## 2021-04-26 ENCOUNTER — Other Ambulatory Visit: Payer: Self-pay | Admitting: Internal Medicine

## 2021-04-26 DIAGNOSIS — F5104 Psychophysiologic insomnia: Secondary | ICD-10-CM

## 2021-04-26 DIAGNOSIS — E039 Hypothyroidism, unspecified: Secondary | ICD-10-CM

## 2021-05-02 ENCOUNTER — Other Ambulatory Visit: Payer: Self-pay | Admitting: Internal Medicine

## 2021-05-02 DIAGNOSIS — Z1231 Encounter for screening mammogram for malignant neoplasm of breast: Secondary | ICD-10-CM

## 2021-05-18 ENCOUNTER — Encounter: Payer: Self-pay | Admitting: Nurse Practitioner

## 2021-05-18 ENCOUNTER — Ambulatory Visit: Payer: Medicare HMO | Admitting: Nurse Practitioner

## 2021-05-18 VITALS — BP 90/60 | HR 80 | Temp 97.8°F | Ht 66.0 in | Wt 137.5 lb

## 2021-05-18 DIAGNOSIS — M546 Pain in thoracic spine: Secondary | ICD-10-CM | POA: Diagnosis not present

## 2021-05-18 DIAGNOSIS — K5904 Chronic idiopathic constipation: Secondary | ICD-10-CM | POA: Diagnosis not present

## 2021-05-18 DIAGNOSIS — K529 Noninfective gastroenteritis and colitis, unspecified: Secondary | ICD-10-CM

## 2021-05-18 NOTE — Patient Instructions (Addendum)
Bland diet, advance as tolerates ? ?Start probiotic twice daily ? ?Start miralax which is over the counter- use half dose to start daily ?Can increase or decrease for effect.  ? ?Heating pad to back.  ?Tylenol as needed  ? ? ? ? ?

## 2021-05-18 NOTE — Progress Notes (Signed)
? ? ?Careteam: ?Patient Care Team: ?McLean-Scocuzza, Nino Glow, MD as PCP - General (Internal Medicine) ?Vilinda Boehringer, MD (Inactive) as Consulting Physician (Internal Medicine) ?Elby Beck, FNP (Nurse Practitioner) ? ?Advanced Directive information ?  ? ?Allergies  ?Allergen Reactions  ? Amoxicillin Other (See Comments)  ?  Pencillins  ? Cefzil [Cefprozil] Other (See Comments)  ?  Unknown  ? Cephalexin Other (See Comments)  ?  Unknown  ? Propoxyphene Other (See Comments)  ?  Unknown  ? Relafen [Nabumetone] Other (See Comments)  ?  Unknown  ? Talwin [Pentazocine] Other (See Comments)  ?  Unknown  ? Penicillins Other (See Comments)  ?  Cannot remember reaction  ? ? ?Chief Complaint  ?Patient presents with  ? Acute Visit  ?  Patient complains of vertigo and pain in back in kidney area. Patient states that she is weak and not much of an appetite. Symptoms started Monday. Patient has had diarrhea and vomiting. Patient feel "spacey". Patient has been staying hydrated.  ? ? ? ?HPI: Patient is a 86 y.o. female seen in today at the [facility] for not feeling well.  ?She had diarrhea 4 days ago and then had vomiting.  ?Felt a little different then the usual GI bug.  ?Feels off- "spacey' ?Having a pain in her back that has been off and on.  ?Not continuously. No back pain previously.  ?She did feel better last night. The mornings are terrible for her.  ?No additional vomiting or diarrhea.  ?She is trying to eat but has a low appetite.  ?Has been able to eat toast and soup.  ?Has not had a regular BM. No BM for 4 days. ?Feeling weak.  ?She does not feel nauseous when she eats.  ?Has GERD, takes protonix twice daily. She is following with GI doctor due to ongoing GERD had EGD in November but did not find anything.  ? ? ?Review of Systems:  ?Review of Systems  ?Constitutional:  Positive for malaise/fatigue. Negative for chills, fever and weight loss.  ?HENT:  Negative for tinnitus.   ?Respiratory:  Negative for  cough, sputum production and shortness of breath.   ?Cardiovascular:  Negative for chest pain, palpitations and leg swelling.  ?Gastrointestinal:  Positive for constipation. Negative for abdominal pain, diarrhea and heartburn.  ?Genitourinary:  Negative for dysuria, frequency and urgency.  ?Musculoskeletal:  Positive for back pain. Negative for myalgias.  ?Skin: Negative.   ?Neurological:  Positive for weakness. Negative for dizziness and headaches.  ?Psychiatric/Behavioral:    ?     Increase in stressors recently  ? ?Past Medical History:  ?Diagnosis Date  ? Abdominal pain   ? Abnormal CT of liver   ? Anxiety   ? Aortic stenosis   ? Aortic valve disorders   ? Arthritis   ? Asthma   ? Barrett's esophagus   ? [facility] last EGD 02/2018 Dr. Gustavo Lah   ? Colon polyps   ? COPD (chronic obstructive pulmonary disease) ([place])   ? DDD (degenerative disc disease), cervical   ? Depressive disorder   ? Diverticulosis   ? Erosive esophagitis   ? Fundic gland polyps of stomach, benign   ? Gastric polyp   ? Gastric polyps   ? Gastritis   ? GERD (gastroesophageal reflux disease)   ? Goiter   ? Hiatal hernia   ? small   ? Hiatal hernia   ? Hypercholesteremia   ? Hypothyroidism   ? Impaired fasting glucose   ?  Intrinsic asthma, unspecified   ? Left rib fracture   ? Obstructive sleep apnea (adult) (pediatric)   ? used cpap x 10 years per Dr. Mortimer Fries off as of 12/30/18   ? OSA (obstructive sleep apnea)   ? used cpap x 10 years per Dr. Mortimer Fries off as of 12/30/18   ? Osteoporosis   ? Pneumonia   ? Pure hypercholesterolemia   ? Schatzki's ring   ? Schatzki's ring   ? Shingles   ? Syncope and collapse   ? TIA (transient ischemic attack)   ? UTI (urinary tract infection)   ? Vitamin D deficiency   ? ?Past Surgical History:  ?Procedure Laterality Date  ? BREAST EXCISIONAL BIOPSY Left   ? BREAST SURGERY    ? COLONOSCOPY    ? COLONOSCOPY WITH ESOPHAGOGASTRODUODENOSCOPY (EGD)    ? COLONOSCOPY WITH PROPOFOL N/A 12/14/2014  ? Procedure: COLONOSCOPY WITH  PROPOFOL;  Surgeon: Lollie Sails, MD;  Location: [facility] ENDOSCOPY;  Service: Endoscopy;  Laterality: N/A;  ? COLONOSCOPY WITH PROPOFOL N/A 03/18/2018  ? Procedure: COLONOSCOPY WITH PROPOFOL;  Surgeon: Lollie Sails, MD;  Location: [facility] ENDOSCOPY;  Service: Endoscopy;  Laterality: N/A;  ? ENUCLEATION    ? ESOPHAGOGASTRODUODENOSCOPY N/A 08/30/2020  ? Procedure: ESOPHAGOGASTRODUODENOSCOPY (EGD);  Surgeon: Lesly Rubenstein, MD;  Location: [facility] ENDOSCOPY;  Service: Endoscopy;  Laterality: N/A;  ? ESOPHAGOGASTRODUODENOSCOPY (EGD) WITH PROPOFOL N/A 12/14/2014  ? Procedure: ESOPHAGOGASTRODUODENOSCOPY (EGD) WITH PROPOFOL;  Surgeon: Lollie Sails, MD;  Location: [facility] ENDOSCOPY;  Service: Endoscopy;  Laterality: N/A;  ? ESOPHAGOGASTRODUODENOSCOPY (EGD) WITH PROPOFOL N/A 10/18/2016  ? Procedure: ESOPHAGOGASTRODUODENOSCOPY (EGD) WITH PROPOFOL;  Surgeon: Lollie Sails, MD;  Location: [facility] ENDOSCOPY;  Service: Endoscopy;  Laterality: N/A;  ? ESOPHAGOGASTRODUODENOSCOPY (EGD) WITH PROPOFOL N/A 03/18/2018  ? Procedure: ESOPHAGOGASTRODUODENOSCOPY (EGD) WITH PROPOFOL;  Surgeon: Lollie Sails, MD;  Location: [facility] ENDOSCOPY;  Service: Endoscopy;  Laterality: N/A;  ? ESOPHAGOGASTRODUODENOSCOPY (EGD) WITH PROPOFOL N/A 12/19/2020  ? Procedure: ESOPHAGOGASTRODUODENOSCOPY (EGD) WITH PROPOFOL;  Surgeon: Lesly Rubenstein, MD;  Location: [facility] ENDOSCOPY;  Service: Endoscopy;  Laterality: N/A;  ? EYE SURGERY    ? FRACTURE SURGERY    ? HIP SURGERY Left   ? pin and plate  ? IR ANGIO INTRA EXTRACRAN SEL COM CAROTID INNOMINATE BILAT MOD SED  10/17/2018  ? IR ANGIO VERTEBRAL SEL VERTEBRAL BILAT MOD SED  10/17/2018  ? IR ANGIOGRAM EXTREMITY LEFT  10/17/2018  ? JOINT REPLACEMENT    ? Left Total Knee Arthroplasty    ? Dr Marry Guan and also had 39 years ago from 2020   ? nasal polyps    ? removed x 2 affects sense of smell x 10-15 years from 04/28/20  ? prosthesis eye implant    ? REPLACEMENT UNICONDYLAR JOINT KNEE Left   ?  TONSILLECTOMY    ? TOTAL KNEE REVISION Left 09/13/2017  ? Procedure: TOTAL KNEE REVISION;  Surgeon: Dereck Leep, MD;  Location: [facility] ORS;  Service: Orthopedics;  Laterality: Left;  ? ?Social History: ?  reports that she has never smoked. She has never used smokeless tobacco. She reports current alcohol use of about 1.0 standard drink per week. She reports that she does not use drugs. ? ?Family History  ?Problem Relation Age of Onset  ? Breast cancer Mother   ?     pagets dx 7s  ? Heart disease Father   ? Alcohol abuse Father   ? Dementia Sister   ?     twin sister 23  dx'ed dementia  ? Bipolar disorder Brother   ? ? ?Medications: ?Patient's Medications  ?New Prescriptions  ? No medications on file  ?Previous Medications  ? ACETAMINOPHEN (TYLENOL) 500 MG TABLET    Take 500 mg by mouth every 6 (six) hours as needed (PAIN).  ? ALBUTEROL (VENTOLIN HFA) 108 (90 BASE) MCG/ACT INHALER    Inhale 2 puffs into the lungs every 6 (six) hours as needed for wheezing.  ? ASPIRIN 325 MG TABLET    Take 325 mg by mouth daily.   ? AZELASTINE HCL 0.15 % SOLN    Place 1 spray into the nose 2 (two) times daily as needed (Post nasal drip).  ? BUDESONIDE-FORMOTEROL (SYMBICORT) 160-4.5 MCG/ACT INHALER    Inhale 2 puffs into the lungs 2 (two) times daily.  ? CALCIUM CARBONATE (TUMS - DOSED IN MG ELEMENTAL CALCIUM) 500 MG CHEWABLE TABLET    Chew 1 tablet by mouth 4 (four) times daily as needed for indigestion or heartburn.  ? DICLOFENAC SODIUM (VOLTAREN) 1 % GEL    Apply 4 g topically 4 (four) times daily.  ? FLUTICASONE (FLONASE) 50 MCG/ACT NASAL SPRAY    Place into both nostrils daily.  ? HYDROCORTISONE 2.5 % CREAM    Apply topically 2 (two) times daily. Prn right lower chin  ? LATANOPROST (XALATAN) 0.005 % OPHTHALMIC SOLUTION      ? LEVOTHYROXINE (SYNTHROID) 50 MCG TABLET    TAKE 1 TABLET BY MOUTH EVERY DAY BEFORE BREAKFAST  ? LORATADINE (CLARITIN) 10 MG TABLET    Take 1 tablet (10 mg total) by mouth daily as needed for allergies.  ?  MONTELUKAST (SINGULAIR) 10 MG TABLET    Take 1 tablet (10 mg total) by mouth at bedtime.  ? PANTOPRAZOLE (PROTONIX) 40 MG TABLET    Take 40 mg by mouth 2 (two) times daily before a meal.  ? PAROXETINE (PAXIL) 30 MG

## 2021-05-30 ENCOUNTER — Ambulatory Visit (INDEPENDENT_AMBULATORY_CARE_PROVIDER_SITE_OTHER): Payer: Medicare HMO

## 2021-05-30 VITALS — Ht 66.0 in | Wt 137.0 lb

## 2021-05-30 DIAGNOSIS — Z0000 Encounter for general adult medical examination without abnormal findings: Secondary | ICD-10-CM | POA: Diagnosis not present

## 2021-05-30 NOTE — Patient Instructions (Addendum)
?  Dorothy Church , ?Thank you for taking time to come for your Medicare Wellness Visit. I appreciate your ongoing commitment to your health goals. Please review the following plan we discussed and let me know if I can assist you in the future.  ? ?These are the goals we discussed: ? Goals   ? ?  ? Patient Stated  ?   Maintain Healthy Lifestyle (pt-stated)   ?   Stay active ?Healthy diet ? ?  ? ?  ?  ?This is a list of the screening recommended for you and due dates:  ?Health Maintenance  ?Topic Date Due  ? COVID-19 Vaccine (4 - Booster for Moderna series) 09/19/2021*  ? Flu Shot  09/19/2021  ? Tetanus Vaccine  04/17/2026  ? Pneumonia Vaccine  Completed  ? DEXA scan (bone density measurement)  Completed  ? Zoster (Shingles) Vaccine  Completed  ? HPV Vaccine  Aged Out  ?*Topic was postponed. The date shown is not the original due date.  ?  ?

## 2021-05-30 NOTE — Progress Notes (Signed)
Subjective:   Dorothy Church is a 86 y.o. female who presents for Medicare Annual (Subsequent) preventive examination.  Review of Systems    No ROS.  Medicare Wellness Virtual Visit.  Visual/audio telehealth visit, UTA vital signs.   See social history for additional risk factors.   Cardiac Risk Factors include: advanced age (>16men, >66 women)     Objective:    Today's Vitals   05/30/21 1117  Weight: 137 lb (62.1 kg)  Height: 5\' 6"  (1.676 m)   Body mass index is 22.11 kg/m.     05/30/2021   11:30 AM 12/19/2020    7:39 AM 08/30/2020    8:19 AM 05/27/2020   11:16 AM 05/27/2019   11:26 AM 10/17/2018    8:37 AM 04/28/2018   12:18 PM  Advanced Directives  Does Patient Have a Medical Advance Directive? Yes Yes Yes Yes Yes Yes Yes  Type of Estate agent of [place];Living will  Healthcare Power of [place];Living will Healthcare Power of [place];Living will Healthcare Power of [place];Living will Healthcare Power of [place];Living will Healthcare Power of [place];Living will  Does patient want to make changes to medical advance directive? No - Patient declined   No - Patient declined No - Patient declined No - Patient declined   Copy of Healthcare Power of Attorney in Chart? Yes - validated most recent copy scanned in chart (See row information)  No - copy requested Yes - validated most recent copy scanned in chart (See row information) No - copy requested No - copy requested No - copy requested    Current Medications (verified) Outpatient Encounter Medications as of 05/30/2021  Medication Sig   esomeprazole (NEXIUM) 20 MG capsule Take 20 mg by mouth daily at 12 noon.   acetaminophen (TYLENOL) 500 MG tablet Take 500 mg by mouth every 6 (six) hours as needed (PAIN).   albuterol (VENTOLIN HFA) 108 (90 Base) MCG/ACT inhaler Inhale 2 puffs into the lungs every 6 (six) hours as needed for wheezing.   aspirin 325 MG tablet Take 325 mg by mouth daily.    Azelastine  HCl 0.15 % SOLN Place 1 spray into the nose 2 (two) times daily as needed (Post nasal drip).   budesonide-formoterol (SYMBICORT) 160-4.5 MCG/ACT inhaler Inhale 2 puffs into the lungs 2 (two) times daily.   calcium carbonate (TUMS - DOSED IN MG ELEMENTAL CALCIUM) 500 MG chewable tablet Chew 1 tablet by mouth 4 (four) times daily as needed for indigestion or heartburn.   diclofenac sodium (VOLTAREN) 1 % GEL Apply 4 g topically 4 (four) times daily. (Patient taking differently: Apply 4 g topically 4 (four) times daily as needed (for pain).)   fluticasone (FLONASE) 50 MCG/ACT nasal spray Place into both nostrils daily.   hydrocortisone 2.5 % cream Apply topically 2 (two) times daily. Prn right lower chin (Patient not taking: Reported on 05/18/2021)   latanoprost (XALATAN) 0.005 % ophthalmic solution    levothyroxine (SYNTHROID) 50 MCG tablet TAKE 1 TABLET BY MOUTH EVERY DAY BEFORE BREAKFAST   loratadine (CLARITIN) 10 MG tablet Take 1 tablet (10 mg total) by mouth daily as needed for allergies.   montelukast (SINGULAIR) 10 MG tablet Take 1 tablet (10 mg total) by mouth at bedtime.   pantoprazole (PROTONIX) 40 MG tablet Take 40 mg by mouth 2 (two) times daily before a meal. (Patient not taking: Reported on 05/18/2021)   PARoxetine (PAXIL) 30 MG tablet TAKE 1 TABLET BY MOUTH EVERY MORNING   Polyvinyl Alcohol-Povidone PF  1.4-0.6 % SOLN Place 2 drops into both eyes 2 (two) times daily as needed (for dry eyes).   RABEprazole (ACIPHEX) 20 MG tablet  (Patient not taking: Reported on 12/19/2020)   simvastatin (ZOCOR) 20 MG tablet TAKE 1 TABLET BY MOUTH EVERYDAY AT BEDTIME   traZODone (DESYREL) 50 MG tablet TAKE 0.5-1 TABLETS (25-50 MG TOTAL) BY MOUTH AT BEDTIME AS NEEDED FOR SLEEP.   No facility-administered encounter medications on file as of 05/30/2021.    Allergies (verified) Amoxicillin, Cefzil [cefprozil], Cephalexin, Propoxyphene, Relafen [nabumetone], Talwin [pentazocine], and Penicillins    History: Past Medical History:  Diagnosis Date   Abdominal pain    Abnormal CT of liver    Anxiety    Aortic stenosis    Aortic valve disorders    Arthritis    Asthma    Barrett's esophagus    [facility] last EGD 02/2018 Dr. Marva Panda    Colon polyps    COPD (chronic obstructive pulmonary disease) (HCC)    DDD (degenerative disc disease), cervical    Depressive disorder    Diverticulosis    Erosive esophagitis    Fundic gland polyps of stomach, benign    Gastric polyp    Gastric polyps    Gastritis    GERD (gastroesophageal reflux disease)    Goiter    Hiatal hernia    small    Hiatal hernia    Hypercholesteremia    Hypothyroidism    Impaired fasting glucose    Intrinsic asthma, unspecified    Left rib fracture    Obstructive sleep apnea (adult) (pediatric)    used cpap x 10 years per Dr. Belia Heman off as of 12/30/18    OSA (obstructive sleep apnea)    used cpap x 10 years per Dr. Belia Heman off as of 12/30/18    Osteoporosis    Pneumonia    Pure hypercholesterolemia    Schatzki's ring    Schatzki's ring    Shingles    Syncope and collapse    TIA (transient ischemic attack)    UTI (urinary tract infection)    Vitamin D deficiency    Past Surgical History:  Procedure Laterality Date   BREAST EXCISIONAL BIOPSY Left    BREAST SURGERY     COLONOSCOPY     COLONOSCOPY WITH ESOPHAGOGASTRODUODENOSCOPY (EGD)     COLONOSCOPY WITH PROPOFOL N/A 12/14/2014   Procedure: COLONOSCOPY WITH PROPOFOL;  Surgeon: Christena Deem, MD;  Location: [facility] ENDOSCOPY;  Service: Endoscopy;  Laterality: N/A;   COLONOSCOPY WITH PROPOFOL N/A 03/18/2018   Procedure: COLONOSCOPY WITH PROPOFOL;  Surgeon: Christena Deem, MD;  Location: [facility] ENDOSCOPY;  Service: Endoscopy;  Laterality: N/A;   ENUCLEATION     ESOPHAGOGASTRODUODENOSCOPY N/A 08/30/2020   Procedure: ESOPHAGOGASTRODUODENOSCOPY (EGD);  Surgeon: Regis Bill, MD;  Location: [facility] ENDOSCOPY;  Service: Endoscopy;  Laterality: N/A;    ESOPHAGOGASTRODUODENOSCOPY (EGD) WITH PROPOFOL N/A 12/14/2014   Procedure: ESOPHAGOGASTRODUODENOSCOPY (EGD) WITH PROPOFOL;  Surgeon: Christena Deem, MD;  Location: [facility] ENDOSCOPY;  Service: Endoscopy;  Laterality: N/A;   ESOPHAGOGASTRODUODENOSCOPY (EGD) WITH PROPOFOL N/A 10/18/2016   Procedure: ESOPHAGOGASTRODUODENOSCOPY (EGD) WITH PROPOFOL;  Surgeon: Christena Deem, MD;  Location: [facility] ENDOSCOPY;  Service: Endoscopy;  Laterality: N/A;   ESOPHAGOGASTRODUODENOSCOPY (EGD) WITH PROPOFOL N/A 03/18/2018   Procedure: ESOPHAGOGASTRODUODENOSCOPY (EGD) WITH PROPOFOL;  Surgeon: Christena Deem, MD;  Location: [facility] ENDOSCOPY;  Service: Endoscopy;  Laterality: N/A;   ESOPHAGOGASTRODUODENOSCOPY (EGD) WITH PROPOFOL N/A 12/19/2020   Procedure: ESOPHAGOGASTRODUODENOSCOPY (EGD) WITH PROPOFOL;  Surgeon: Eather Colas  T, MD;  Location: [facility] ENDOSCOPY;  Service: Endoscopy;  Laterality: N/A;   EYE SURGERY     FRACTURE SURGERY     HIP SURGERY Left    pin and plate   IR ANGIO INTRA EXTRACRAN SEL COM CAROTID INNOMINATE BILAT MOD SED  10/17/2018   IR ANGIO VERTEBRAL SEL VERTEBRAL BILAT MOD SED  10/17/2018   IR ANGIOGRAM EXTREMITY LEFT  10/17/2018   JOINT REPLACEMENT     Left Total Knee Arthroplasty     Dr Ernest Pine and also had 39 years ago from 2020    nasal polyps     removed x 2 affects sense of smell x 10-15 years from 04/28/20   prosthesis eye implant     REPLACEMENT UNICONDYLAR JOINT KNEE Left    TONSILLECTOMY     TOTAL KNEE REVISION Left 09/13/2017   Procedure: TOTAL KNEE REVISION;  Surgeon: Donato Heinz, MD;  Location: [facility] ORS;  Service: Orthopedics;  Laterality: Left;   Family History  Problem Relation Age of Onset   Breast cancer Mother        pagets dx 36s   Heart disease Father    Alcohol abuse Father    Dementia Sister        twin sister 66 dx'ed dementia   Bipolar disorder Brother    Social History   Socioeconomic History   Marital status: Divorced    Spouse name: Not on  file   Number of children: Not on file   Years of education: Not on file   Highest education level: Not on file  Occupational History   Not on file  Tobacco Use   Smoking status: Never   Smokeless tobacco: Never  Vaping Use   Vaping Use: Never used  Substance and Sexual Activity   Alcohol use: Yes    Alcohol/week: 1.0 standard drink    Types: 1 Glasses of wine per week    Comment: occas. wine   Drug use: No   Sexual activity: Never  Other Topics Concern   Not on file  Social History Narrative   Has twin sister identical lives in [place] does not live with her    Lived in [place], moved to [place] two years ago.   Divorced.      Has a living will.   Would desire CPR, would not desire extraordinary measures.      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   Social Determinants of Corporate investment banker Strain: Low Risk    Difficulty of Paying Living Expenses: Not hard at all  Food Insecurity: No Food Insecurity   Worried About Programme researcher, broadcasting/film/video in the Last Year: Never true   Barista in the Last Year: Never true  Transportation Needs: No Transportation Needs   Lack of Transportation (Medical): No   Lack of Transportation (Non-Medical): No  Physical Activity: Insufficiently Active   Days of Exercise per Week: 5 days   Minutes of Exercise per Session: 20 min  Stress: No Stress Concern Present   Feeling of Stress : Not at all  Social Connections: Unknown   Frequency of Communication with Friends and Family: More than three times a week   Frequency of Social Gatherings with Friends and Family: More than three times a week   Attends Religious Services: Not on file   Active Member of Clubs or Organizations: Not on file   Attends [employer]  or Organization Meetings: Not on file   Marital Status: Not on file    Tobacco Counseling Counseling given: Not Answered   Clinical Intake:  Pre-visit preparation  completed: Yes        Diabetes: No  How often do you need to have someone help you when you read instructions, pamphlets, or other written materials from your doctor or pharmacy?: 1 - Never   Interpreter Needed?: No      Activities of Daily Living    05/30/2021   11:31 AM  In your present state of health, do you have any difficulty performing the following activities:  Hearing? 1  Comment Hearing aids  Vision? 0  Difficulty concentrating or making decisions? 0  Walking or climbing stairs? 0  Dressing or bathing? 0  Doing errands, shopping? 0  Preparing Food and eating ? N  Using the Toilet? N  In the past six months, have you accidently leaked urine? N  Do you have problems with loss of bowel control? N  Managing your Medications? N  Managing your Finances? N  Housekeeping or managing your Housekeeping? N    Patient Care Team: McLean-Scocuzza, Pasty Spillers, MD as PCP - General (Internal Medicine) Stephanie Acre, MD (Inactive) as Consulting Physician (Internal Medicine) Emi Belfast, FNP (Nurse Practitioner)  Indicate any recent Medical Services you may have received from other than [facility] providers in the past year (date may be approximate).     Assessment:   This is a routine wellness examination for Erryn.  Virtual Visit via Telephone Note  I connected with  Serita Butcher on 05/30/21 at 11:15 AM EDT by telephone and verified that I am speaking with the correct person using two identifiers.  Persons participating in the virtual visit: patient/Nurse Health Advisor   I discussed the limitations of performing an evaluation and management service by telehealth.  The patient expressed understanding and agreed to proceed.  Some vital signs may be absent or patient reported.   Hearing/Vision screen Hearing Screening - Comments:: Hearing aids Vision Screening - Comments:: Followed by [facility]  Wears corrective lenses  They have seen their  ophthalmologist in the last 12 months  Dietary issues and exercise activities discussed: Current Exercise Habits: Home exercise routine, Type of exercise: walking, Intensity: Mild   Goals Addressed               This Visit's Progress     Patient Stated     Maintain Healthy Lifestyle (pt-stated)        Stay active Healthy diet        Depression Screen    05/30/2021   11:28 AM 09/28/2020    2:02 PM 05/27/2020   11:14 AM 09/15/2019   11:01 AM 05/27/2019   11:28 AM 04/28/2018   12:20 PM 06/26/2017    2:28 PM  PHQ 2/9 Scores  PHQ - 2 Score 0 0 0 0 1 0 0  PHQ- 9 Score  0    0     Fall Risk    05/30/2021   11:31 AM 05/18/2021   10:00 AM 09/28/2020    2:02 PM 05/27/2020   11:18 AM 09/15/2019   11:00 AM  Fall Risk   Falls in the past year? 0 0 1 1 0  Number falls in past yr: 0 0 1 1 0  Injury with Fall?  0 1  0  Comment    Followed up with pcp   Risk for fall due  to :  No Fall Risks History of fall(s)    Follow up Falls evaluation completed Falls evaluation completed Falls evaluation completed Falls evaluation completed Falls evaluation completed    FALL RISK PREVENTION PERTAINING TO THE HOME: Home free of loose throw rugs in walkways, pet beds, electrical cords, etc? Yes  Adequate lighting in your home to reduce risk of falls? Yes   ASSISTIVE DEVICES UTILIZED TO PREVENT FALLS: Life alert? No  Use of a cane, walker or w/c? No  Grab bars in the bathroom? Yes  Shower chair or bench in shower? No  Comfort height toilet ?   TIMED UP AND GO: Was the test performed? No .   Cognitive Function: Patient is alert and oriented x3.  Manages her own finances and medications.      04/28/2018   12:19 PM 10/24/2016   11:38 AM  MMSE - Mini Mental State Exam  Orientation to time 5 5  Orientation to Place 5 5  Registration 3 3  Attention/ Calculation 0 0  Recall 3 3  Language- name 2 objects 0 0  Language- repeat 1 1  Language- follow 3 step command 3 3  Language- read & follow  direction 0 0  Write a sentence 0 0  Copy design 0 0  Total score 20 20        05/27/2019   11:20 AM  6CIT Screen  What Year? 0 points  What month? 0 points  What time? 0 points  Count back from 20 0 points  Months in reverse 0 points  Repeat phrase 0 points  Total Score 0 points    Immunizations Immunization History  Administered Date(s) Administered   Fluad Quad(high Dose 65+) 12/05/2019, 11/04/2020   Influenza Whole 12/14/2010   Influenza, High Dose Seasonal PF 12/02/2017, 12/08/2018   Influenza,inj,Quad PF,6+ Mos 11/10/2012, 11/05/2013, 11/10/2014, 10/17/2015, 10/24/2016   Influenza-Unspecified 12/04/2011, 11/10/2012, 11/05/2013, 12/07/2018   Moderna Sars-Covid-2 Vaccination 03/03/2019, 03/31/2019, 11/30/2019   Pneumococcal Conjugate-13 10/07/2009, 04/23/2013, 11/24/2013   Pneumococcal Polysaccharide-23 11/13/2009, 12/19/2018   Td 04/17/2016   Zoster Recombinat (Shingrix) 04/09/2018, 09/15/2018   Zoster, Live 05/08/2007   Screening Tests Health Maintenance  Topic Date Due   COVID-19 Vaccine (4 - Booster for Moderna series) 09/19/2021 (Originally 01/25/2020)   INFLUENZA VACCINE  09/19/2021   TETANUS/TDAP  04/17/2026   Pneumonia Vaccine 16+ Years old  Completed   DEXA SCAN  Completed   Zoster Vaccines- Shingrix  Completed   HPV VACCINES  Aged Out   Health Maintenance There are no preventive care reminders to display for this patient.  Lung Cancer Screening: (Low Dose CT Chest recommended if Age 66-80 years, 30 pack-year currently smoking OR have quit w/in 15years.) does not qualify.   Hepatitis C Screening: does not qualify  Vision Screening: Recommended annual ophthalmology exams for early detection of glaucoma and other disorders of the eye.  Dental Screening: Recommended annual dental exams for proper oral hygiene  Community Resource Referral / Chronic Care Management: CRR required this visit?  No   CCM required this visit?  No      Plan:   Keep all  routine maintenance appointments.   I have personally reviewed and noted the following in the patient's chart:   Medical and social history Use of alcohol, tobacco or illicit drugs  Current medications and supplements including opioid prescriptions.  Functional ability and status Nutritional status Physical activity Advanced directives List of other physicians Hospitalizations, surgeries, and ER visits in previous 12 months  Vitals Screenings to include cognitive, depression, and falls Referrals and appointments  In addition, I have reviewed and discussed with patient certain preventive protocols, quality metrics, and best practice recommendations. A written personalized care plan for preventive services as well as general preventive health recommendations were provided to patient.     Ashok Pall, LPN   1/61/0960

## 2021-07-11 ENCOUNTER — Ambulatory Visit
Admission: RE | Admit: 2021-07-11 | Discharge: 2021-07-11 | Disposition: A | Discharge status: Home / Self Care | Payer: Medicare HMO | Source: Ambulatory Visit | Attending: Internal Medicine | Admitting: Internal Medicine

## 2021-07-11 DIAGNOSIS — Z1231 Encounter for screening mammogram for malignant neoplasm of breast: Secondary | ICD-10-CM | POA: Insufficient documentation

## 2021-07-12 ENCOUNTER — Telehealth: Payer: Self-pay

## 2021-07-12 ENCOUNTER — Encounter: Payer: Self-pay | Admitting: Internal Medicine

## 2021-07-12 ENCOUNTER — Other Ambulatory Visit: Payer: Self-pay | Admitting: Internal Medicine

## 2021-07-12 DIAGNOSIS — N6489 Other specified disorders of breast: Secondary | ICD-10-CM

## 2021-07-12 DIAGNOSIS — R928 Other abnormal and inconclusive findings on diagnostic imaging of breast: Secondary | ICD-10-CM

## 2021-07-12 NOTE — Telephone Encounter (Signed)
Lvm for pt to return call in regards to mammogram results.  Per Dr.Tracy: Asymmetry of right breast  Call the breast center please they recommend diagnostic mammogram and ultrasound of right breast

## 2021-07-14 ENCOUNTER — Encounter: Payer: Self-pay | Admitting: Internal Medicine

## 2021-08-07 ENCOUNTER — Ambulatory Visit
Admission: RE | Admit: 2021-08-07 | Discharge: 2021-08-07 | Disposition: A | Discharge status: Home / Self Care | Payer: Medicare HMO | Source: Ambulatory Visit | Attending: Internal Medicine | Admitting: Internal Medicine

## 2021-08-07 DIAGNOSIS — N6489 Other specified disorders of breast: Secondary | ICD-10-CM

## 2021-08-07 DIAGNOSIS — R928 Other abnormal and inconclusive findings on diagnostic imaging of breast: Secondary | ICD-10-CM

## 2021-08-07 DIAGNOSIS — R922 Inconclusive mammogram: Secondary | ICD-10-CM | POA: Diagnosis not present

## 2021-10-02 DIAGNOSIS — H401110 Primary open-angle glaucoma, right eye, stage unspecified: Secondary | ICD-10-CM | POA: Diagnosis not present

## 2021-10-02 DIAGNOSIS — H40001 Preglaucoma, unspecified, right eye: Secondary | ICD-10-CM | POA: Diagnosis not present

## 2021-10-04 DIAGNOSIS — E039 Hypothyroidism, unspecified: Secondary | ICD-10-CM | POA: Diagnosis not present

## 2021-10-04 DIAGNOSIS — E559 Vitamin D deficiency, unspecified: Secondary | ICD-10-CM | POA: Diagnosis not present

## 2021-10-04 DIAGNOSIS — M810 Age-related osteoporosis without current pathological fracture: Secondary | ICD-10-CM | POA: Diagnosis not present

## 2021-10-14 ENCOUNTER — Other Ambulatory Visit: Payer: Self-pay | Admitting: Internal Medicine

## 2021-10-14 DIAGNOSIS — E039 Hypothyroidism, unspecified: Secondary | ICD-10-CM

## 2021-10-16 ENCOUNTER — Other Ambulatory Visit: Payer: Self-pay | Admitting: Internal Medicine

## 2021-10-16 DIAGNOSIS — H2511 Age-related nuclear cataract, right eye: Secondary | ICD-10-CM | POA: Diagnosis not present

## 2021-10-16 DIAGNOSIS — F5104 Psychophysiologic insomnia: Secondary | ICD-10-CM

## 2021-10-17 ENCOUNTER — Encounter: Payer: Self-pay | Admitting: Family Medicine

## 2021-10-17 ENCOUNTER — Ambulatory Visit: Payer: Medicare HMO | Admitting: Podiatry

## 2021-10-17 ENCOUNTER — Ambulatory Visit (INDEPENDENT_AMBULATORY_CARE_PROVIDER_SITE_OTHER): Payer: Medicare HMO | Admitting: Family Medicine

## 2021-10-17 ENCOUNTER — Ambulatory Visit (INDEPENDENT_AMBULATORY_CARE_PROVIDER_SITE_OTHER): Payer: Medicare HMO

## 2021-10-17 VITALS — BP 112/70 | HR 86 | Temp 98.4°F | Ht 66.0 in | Wt 136.2 lb

## 2021-10-17 DIAGNOSIS — J4550 Severe persistent asthma, uncomplicated: Secondary | ICD-10-CM

## 2021-10-17 DIAGNOSIS — E785 Hyperlipidemia, unspecified: Secondary | ICD-10-CM | POA: Diagnosis not present

## 2021-10-17 DIAGNOSIS — M7672 Peroneal tendinitis, left leg: Secondary | ICD-10-CM | POA: Diagnosis not present

## 2021-10-17 DIAGNOSIS — E039 Hypothyroidism, unspecified: Secondary | ICD-10-CM

## 2021-10-17 DIAGNOSIS — R7301 Impaired fasting glucose: Secondary | ICD-10-CM | POA: Diagnosis not present

## 2021-10-17 DIAGNOSIS — G4700 Insomnia, unspecified: Secondary | ICD-10-CM | POA: Diagnosis not present

## 2021-10-17 DIAGNOSIS — G459 Transient cerebral ischemic attack, unspecified: Secondary | ICD-10-CM | POA: Diagnosis not present

## 2021-10-17 DIAGNOSIS — K219 Gastro-esophageal reflux disease without esophagitis: Secondary | ICD-10-CM

## 2021-10-17 DIAGNOSIS — Z23 Encounter for immunization: Secondary | ICD-10-CM

## 2021-10-17 DIAGNOSIS — R52 Pain, unspecified: Secondary | ICD-10-CM | POA: Diagnosis not present

## 2021-10-17 DIAGNOSIS — F339 Major depressive disorder, recurrent, unspecified: Secondary | ICD-10-CM | POA: Diagnosis not present

## 2021-10-17 DIAGNOSIS — R69 Illness, unspecified: Secondary | ICD-10-CM | POA: Diagnosis not present

## 2021-10-17 DIAGNOSIS — E538 Deficiency of other specified B group vitamins: Secondary | ICD-10-CM | POA: Diagnosis not present

## 2021-10-17 DIAGNOSIS — E559 Vitamin D deficiency, unspecified: Secondary | ICD-10-CM

## 2021-10-17 DIAGNOSIS — R7989 Other specified abnormal findings of blood chemistry: Secondary | ICD-10-CM

## 2021-10-17 DIAGNOSIS — I6523 Occlusion and stenosis of bilateral carotid arteries: Secondary | ICD-10-CM | POA: Diagnosis not present

## 2021-10-17 LAB — CBC
HCT: 38.2 % (ref 36.0–46.0)
Hemoglobin: 13 g/dL (ref 12.0–15.0)
MCHC: 34 g/dL (ref 30.0–36.0)
MCV: 92.6 fl (ref 78.0–100.0)
Platelets: 180 10*3/uL (ref 150.0–400.0)
RBC: 4.13 Mil/uL (ref 3.87–5.11)
RDW: 13.4 % (ref 11.5–15.5)
WBC: 4.5 10*3/uL (ref 4.0–10.5)

## 2021-10-17 LAB — LIPID PANEL
Cholesterol: 152 mg/dL (ref 0–200)
HDL: 61.4 mg/dL (ref >39.00)
LDL Cholesterol: 68 mg/dL (ref 0–99)
NonHDL: 90.18
Total CHOL/HDL Ratio: 2
Triglycerides: 110 mg/dL (ref 0.0–149.0)
VLDL: 22 mg/dL (ref 0.0–40.0)

## 2021-10-17 LAB — COMPREHENSIVE METABOLIC PANEL
ALT: 10 U/L (ref 0–35)
AST: 14 U/L (ref 0–37)
Albumin: 4 g/dL (ref 3.5–5.2)
Alkaline Phosphatase: 50 U/L (ref 39–117)
BUN: 13 mg/dL (ref 6–23)
CO2: 29 mEq/L (ref 19–32)
Calcium: 9.1 mg/dL (ref 8.4–10.5)
Chloride: 104 mEq/L (ref 96–112)
Creatinine, Ser: 0.82 mg/dL (ref 0.40–1.20)
GFR: 65.94 mL/min (ref >60.00)
Glucose, Bld: 83 mg/dL (ref 70–99)
Potassium: 4.1 mEq/L (ref 3.5–5.1)
Sodium: 141 mEq/L (ref 135–145)
Total Bilirubin: 0.5 mg/dL (ref 0.2–1.2)
Total Protein: 6.3 g/dL (ref 6.0–8.3)

## 2021-10-17 LAB — TSH: TSH: 2.18 u[IU]/mL (ref 0.35–5.50)

## 2021-10-17 LAB — VITAMIN B12: Vitamin B-12: 314 pg/mL (ref 211–911)

## 2021-10-17 LAB — VITAMIN D 25 HYDROXY (VIT D DEFICIENCY, FRACTURES): VITD: 21.33 ng/mL — ABNORMAL LOW (ref 30.00–100.00)

## 2021-10-17 MED ORDER — FAMOTIDINE 40 MG PO TABS: 40.0000 mg | ORAL_TABLET | Freq: Every day | ORAL | 0 refills | Status: DC

## 2021-10-17 MED ORDER — ASPIRIN 81 MG PO TBEC: 81.0000 mg | DELAYED_RELEASE_TABLET | Freq: Every day | ORAL | Status: DC

## 2021-10-17 MED ORDER — BETAMETHASONE SOD PHOS & ACET 6 (3-3) MG/ML IJ SUSP
3.0000 mg | Freq: Once | INTRAMUSCULAR | Status: AC
  Administered 2021-10-17: 3 mg via INTRA_ARTICULAR

## 2021-10-17 MED ORDER — MELOXICAM 15 MG PO TABS: 15.0000 mg | ORAL_TABLET | Freq: Every day | ORAL | 1 refills | Status: DC

## 2021-10-17 MED ORDER — VITAMIN D3 50 MCG (2000 UT) PO CAPS: 2000.0000 [IU] | ORAL_CAPSULE | Freq: Every day | ORAL | Status: DC

## 2021-10-17 NOTE — Patient Instructions (Addendum)
It was a pleasure meeting you today. Thank you for allowing me to take part in your health care.  Our goals for today as we discussed include:  We will get some labs today.  If they are abnormal or we need to do something about them, I will call you.  If they are normal, I will send you a message on MyChart (if it is active) or a letter in the mail.  If you don't hear from [place] in 2 weeks, please call the office at the number below.   For your heartburn Stop Prilosec Start Famotidine 40 mg daily  Please follow-up with PCP in 3-4 months to discuss deceasing Prozac.  Please bring in all of your medications at your next visit.   If you have any questions or concerns, please do not hesitate to call the office at [phone].  I look forward to our next visit and until then take care and stay safe.  Regards,   Carollee Leitz, MD   [facility]
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
## 2021-10-17 NOTE — Progress Notes (Signed)
Chief Complaint  Patient presents with   Ankle Pain    Patient states that she twisted the left ankle 10 years ago and it has been giving her problems since.    HPI: 86 y.o. female presenting today for new complaint of pain and tenderness to the posterior aspect of the left ankle.  Patient denies a history of injury.  Patient states that she has had this acute onset of pain over the past few months.  She has not done anything for treatment.  She has had cortisone injections into the ankle joint prior with good success.  She presents for further treatment and evaluation  Past Medical History:  Diagnosis Date   Abdominal pain    Abnormal CT of liver    Anxiety    Aortic stenosis    Aortic valve disorders    Arthritis    Asthma    Barrett's esophagus    [facility] last EGD 02/2018 Dr. Gustavo Lah    Colon polyps    COPD (chronic obstructive pulmonary disease) (HCC)    DDD (degenerative disc disease), cervical    Depressive disorder    Diverticulosis    Erosive esophagitis    Fundic gland polyps of stomach, benign    Gastric polyp    Gastric polyps    Gastritis    GERD (gastroesophageal reflux disease)    Goiter    Hiatal hernia    small    Hiatal hernia    Hypercholesteremia    Hypothyroidism    Impaired fasting glucose    Intrinsic asthma, unspecified    Left rib fracture    Obstructive sleep apnea (adult) (pediatric)    used cpap x 10 years per Dr. Mortimer Fries off as of 12/30/18    OSA (obstructive sleep apnea)    used cpap x 10 years per Dr. Mortimer Fries off as of 12/30/18    Osteoporosis    Pneumonia    Pure hypercholesterolemia    Schatzki's ring    Schatzki's ring    Shingles    Syncope and collapse    TIA (transient ischemic attack)    UTI (urinary tract infection)    Vitamin D deficiency     Past Surgical History:  Procedure Laterality Date   BREAST EXCISIONAL BIOPSY Left    BREAST SURGERY     COLONOSCOPY     COLONOSCOPY WITH ESOPHAGOGASTRODUODENOSCOPY (EGD)      COLONOSCOPY WITH PROPOFOL N/A 12/14/2014   Procedure: COLONOSCOPY WITH PROPOFOL;  Surgeon: Lollie Sails, MD;  Location: [facility] ENDOSCOPY;  Service: Endoscopy;  Laterality: N/A;   COLONOSCOPY WITH PROPOFOL N/A 03/18/2018   Procedure: COLONOSCOPY WITH PROPOFOL;  Surgeon: Lollie Sails, MD;  [facility] ENDOSCOPY;  Service: Endoscopy;  Laterality: N/A;   ENUCLEATION     ESOPHAGOGASTRODUODENOSCOPY N/A 08/30/2020   Procedure: ESOPHAGOGASTRODUODENOSCOPY (EGD);  Surgeon: Lesly Rubenstein, MD;  Location: [facility] ENDOSCOPY;  Service: Endoscopy;  Laterality: N/A;   ESOPHAGOGASTRODUODENOSCOPY (EGD) WITH PROPOFOL N/A 12/14/2014   Procedure: ESOPHAGOGASTRODUODENOSCOPY (EGD) WITH PROPOFOL;  Surgeon: Lollie Sails, MD;  Location: [facility] ENDOSCOPY;  Service: Endoscopy;  Laterality: N/A;   ESOPHAGOGASTRODUODENOSCOPY (EGD) WITH PROPOFOL N/A 10/18/2016   Procedure: ESOPHAGOGASTRODUODENOSCOPY (EGD) WITH PROPOFOL;  Surgeon: Lollie Sails, MD;  Location: [facility] ENDOSCOPY;  Service: Endoscopy;  Laterality: N/A;   ESOPHAGOGASTRODUODENOSCOPY (EGD) WITH PROPOFOL N/A 03/18/2018   Procedure: ESOPHAGOGASTRODUODENOSCOPY (EGD) WITH PROPOFOL;  Surgeon: Lollie Sails, MD;  Location: [facility] ENDOSCOPY;  Service: Endoscopy;  Laterality: N/A;   ESOPHAGOGASTRODUODENOSCOPY (EGD) WITH  PROPOFOL N/A 12/19/2020   Procedure: ESOPHAGOGASTRODUODENOSCOPY (EGD) WITH PROPOFOL;  Surgeon: Lesly Rubenstein, MD;  Location: [facility] ENDOSCOPY;  Service: Endoscopy;  Laterality: N/A;   EYE SURGERY     FRACTURE SURGERY     HIP SURGERY Left    pin and plate   IR ANGIO INTRA EXTRACRAN SEL COM CAROTID INNOMINATE BILAT MOD SED  10/17/2018   IR ANGIO VERTEBRAL SEL VERTEBRAL BILAT MOD SED  10/17/2018   IR ANGIOGRAM EXTREMITY LEFT  10/17/2018   JOINT REPLACEMENT     Left Total Knee Arthroplasty     Dr Marry Guan and also had 39 years ago from 2020    nasal polyps     removed x 2 affects sense of smell x 10-15 years from 04/28/20    prosthesis eye implant     REPLACEMENT UNICONDYLAR JOINT KNEE Left    TONSILLECTOMY     TOTAL KNEE REVISION Left 09/13/2017   Procedure: TOTAL KNEE REVISION;  Surgeon: Dereck Leep, MD;  Location: [facility] ORS;  Service: Orthopedics;  Laterality: Left;    Allergies  Allergen Reactions   Amoxicillin Other (See Comments)    Pencillins   Cefzil [Cefprozil] Other (See Comments)    Unknown   Cephalexin Other (See Comments)    Unknown   Propoxyphene Other (See Comments)    Unknown   Relafen [Nabumetone] Other (See Comments)    Unknown   Talwin [Pentazocine] Other (See Comments)    Unknown   Penicillins Other (See Comments)    Cannot remember reaction     Physical Exam: General: The patient is alert and oriented x3 in no acute distress.  Dermatology: Skin is warm, dry and supple bilateral lower extremities. Negative for open lesions or macerations.  Vascular: Palpable pedal pulses bilaterally. Capillary refill within normal limits.  Negative for any significant edema or erythema  Neurological: Light touch and protective threshold grossly intact  Musculoskeletal Exam: No pedal deformities noted.  There is some pain on palpation to the posterior aspect of the lateral malleolus along the peroneal tendon sheath consistent with a peroneal tendinitis  Radiographic Exam LT ankle 10/17/2021:  Diffuse osteopenia noted which is WNL given the patient's age.  No acute fractures identified.  There is some joint space narrowing to the subtalar joint best visualized on lateral view.  Tibiotalar joint congruent.  Assessment: 1.  Peroneal tendinitis left   Plan of Care:  1. Patient evaluated. X-Rays reviewed.  2.  Injection of 0.5 cc Celestone Soluspan injected into the peroneal tendon sheath left 3.  Recommend Vionic shoes and sandals.  Patient states that she wears bionics currently 4.  Prescription for meloxicam 15 mg daily 5.  Return to clinic as needed     Edrick Kins, DPM [facility]  Dr. Edrick Kins, DPM    [address], [place] [address]                Office [phone]  Fax [phone]

## 2021-10-18 ENCOUNTER — Other Ambulatory Visit: Payer: Self-pay | Admitting: Family Medicine

## 2021-10-25 ENCOUNTER — Encounter: Payer: Self-pay | Admitting: Family Medicine

## 2021-10-25 NOTE — Assessment & Plan Note (Signed)
Goal LDL <70.  On statin.  No myalgias -Checks lipids today -Continue Simvastatin 20 mg daily

## 2021-10-25 NOTE — Assessment & Plan Note (Signed)
PHQ 2 screening negative.  Currently on Paxil.  Not sure if wanting to stay on medication as she feels good. -Plan to wean Prozac gradually in future.  Patient to decide when she wants to start process. -Continue Prozac 30 mg daily

## 2021-10-25 NOTE — Assessment & Plan Note (Signed)
Chronic.  Stable. Follows with pulmonology.  Uses Albuterol prn, mostly in spring.  Daily Symbicort -Continue Symbicort 160-4.28mg 2 puffs daily -Continue Albuterol 1-2 puffs  q6h as needed -Follow up with Pulmonology as scheduled -Strict ED precautions provided

## 2021-10-25 NOTE — Assessment & Plan Note (Signed)
Asymptomatic A1c today

## 2021-10-25 NOTE — Assessment & Plan Note (Deleted)
Chronic.  Stable. Follows with pulmonology.  Uses Albuterol prn, mostly in spring.  Daily Symbicort -Continue Symbicort 160-4.23mg 2 puffs daily -Continue Albuterol 1-2 puffs  q6h as needed -Follow up with Pulmonology as scheduled -Strict ED precautions provided

## 2021-10-25 NOTE — Assessment & Plan Note (Signed)
Continue Trazodone 50 mg qhs as needed. -Discussed sleep hygiene -Discussed weaning Trazodone in future if not helping and increased risks with sedative medication use.  Patient agreeable to continue discussion at later visits

## 2021-10-25 NOTE — Assessment & Plan Note (Signed)
Not using PPI often.  Using TUMs every now and then. Reviewed recent EGD, notable for GERD. Reviewed pathology, no Barrett's esophagitis .   -Discontinue Protonix -Start Famotidine 40 mg daily -Follow up if symptoms worsen

## 2021-10-25 NOTE — Assessment & Plan Note (Signed)
Takes supplements daily -Check Vit D level today -Continue Vitamin D 2000 mcg daily

## 2021-10-25 NOTE — Assessment & Plan Note (Signed)
Asymptomatic. Tolerating medication well.   -Continue ASA 81 mg daily -Continue Simvastatin 20 mg daily -Strict ED precautions provided

## 2021-10-31 ENCOUNTER — Other Ambulatory Visit: Payer: Self-pay

## 2021-10-31 ENCOUNTER — Encounter: Payer: Self-pay | Admitting: Ophthalmology

## 2021-11-01 NOTE — Discharge Instructions (Signed)

## 2021-11-06 DIAGNOSIS — G8929 Other chronic pain: Secondary | ICD-10-CM | POA: Diagnosis not present

## 2021-11-06 DIAGNOSIS — M5416 Radiculopathy, lumbar region: Secondary | ICD-10-CM | POA: Diagnosis not present

## 2021-11-06 DIAGNOSIS — M25561 Pain in right knee: Secondary | ICD-10-CM | POA: Diagnosis not present

## 2021-11-06 DIAGNOSIS — I739 Peripheral vascular disease, unspecified: Secondary | ICD-10-CM | POA: Diagnosis not present

## 2021-11-06 DIAGNOSIS — M1711 Unilateral primary osteoarthritis, right knee: Secondary | ICD-10-CM | POA: Diagnosis not present

## 2021-11-07 ENCOUNTER — Other Ambulatory Visit: Payer: Self-pay

## 2021-11-07 ENCOUNTER — Ambulatory Visit
Admission: RE | Admit: 2021-11-07 | Discharge: 2021-11-07 | Disposition: A | Discharge status: Home / Self Care | Payer: Medicare HMO | Attending: Ophthalmology | Admitting: Ophthalmology

## 2021-11-07 ENCOUNTER — Ambulatory Visit (AMBULATORY_SURGERY_CENTER): Payer: Medicare HMO | Admitting: General Practice

## 2021-11-07 ENCOUNTER — Encounter: Payer: Self-pay | Admitting: Ophthalmology

## 2021-11-07 ENCOUNTER — Encounter
Admission: RE | Disposition: A | Discharge status: Home / Self Care | Payer: Self-pay | Source: Home / Self Care | Attending: Ophthalmology

## 2021-11-07 ENCOUNTER — Ambulatory Visit: Payer: Medicare HMO | Admitting: General Practice

## 2021-11-07 DIAGNOSIS — G4733 Obstructive sleep apnea (adult) (pediatric): Secondary | ICD-10-CM | POA: Diagnosis not present

## 2021-11-07 DIAGNOSIS — J449 Chronic obstructive pulmonary disease, unspecified: Secondary | ICD-10-CM | POA: Diagnosis not present

## 2021-11-07 DIAGNOSIS — I10 Essential (primary) hypertension: Secondary | ICD-10-CM | POA: Insufficient documentation

## 2021-11-07 DIAGNOSIS — H2511 Age-related nuclear cataract, right eye: Secondary | ICD-10-CM | POA: Insufficient documentation

## 2021-11-07 DIAGNOSIS — F32A Depression, unspecified: Secondary | ICD-10-CM | POA: Insufficient documentation

## 2021-11-07 DIAGNOSIS — F419 Anxiety disorder, unspecified: Secondary | ICD-10-CM | POA: Diagnosis not present

## 2021-11-07 DIAGNOSIS — E1136 Type 2 diabetes mellitus with diabetic cataract: Secondary | ICD-10-CM | POA: Diagnosis not present

## 2021-11-07 DIAGNOSIS — I350 Nonrheumatic aortic (valve) stenosis: Secondary | ICD-10-CM | POA: Diagnosis not present

## 2021-11-07 DIAGNOSIS — E039 Hypothyroidism, unspecified: Secondary | ICD-10-CM

## 2021-11-07 DIAGNOSIS — K449 Diaphragmatic hernia without obstruction or gangrene: Secondary | ICD-10-CM | POA: Insufficient documentation

## 2021-11-07 DIAGNOSIS — K279 Peptic ulcer, site unspecified, unspecified as acute or chronic, without hemorrhage or perforation: Secondary | ICD-10-CM | POA: Diagnosis not present

## 2021-11-07 DIAGNOSIS — K219 Gastro-esophageal reflux disease without esophagitis: Secondary | ICD-10-CM | POA: Diagnosis not present

## 2021-11-07 DIAGNOSIS — R69 Illness, unspecified: Secondary | ICD-10-CM | POA: Diagnosis not present

## 2021-11-07 HISTORY — PX: CATARACT EXTRACTION W/PHACO: SHX586

## 2021-11-07 SURGERY — PHACOEMULSIFICATION, CATARACT, WITH IOL INSERTION
Anesthesia: Monitor Anesthesia Care | Site: Eye | Laterality: Right

## 2021-11-07 MED ORDER — BRIMONIDINE TARTRATE-TIMOLOL 0.2-0.5 % OP SOLN
OPHTHALMIC | Status: DC | PRN
  Administered 2021-11-07: 1 [drp] via OPHTHALMIC

## 2021-11-07 MED ORDER — MOXIFLOXACIN HCL 0.5 % OP SOLN
OPHTHALMIC | Status: DC | PRN
  Administered 2021-11-07: 0.2 mL via OPHTHALMIC

## 2021-11-07 MED ORDER — SIGHTPATH DOSE#1 BSS IO SOLN
INTRAOCULAR | Status: DC | PRN
  Administered 2021-11-07: 62 mL via OPHTHALMIC

## 2021-11-07 MED ORDER — ARMC OPHTHALMIC DILATING DROPS
1.0000 | OPHTHALMIC | Status: AC | PRN
  Administered 2021-11-07 (×3): 1 via OPHTHALMIC

## 2021-11-07 MED ORDER — MIDAZOLAM HCL 2 MG/2ML IJ SOLN
INTRAMUSCULAR | Status: DC | PRN
  Administered 2021-11-07: 1 mg via INTRAVENOUS

## 2021-11-07 MED ORDER — SIGHTPATH DOSE#1 BSS IO SOLN
INTRAOCULAR | Status: DC | PRN
  Administered 2021-11-07: 15 mL

## 2021-11-07 MED ORDER — TETRACAINE HCL 0.5 % OP SOLN
1.0000 [drp] | OPHTHALMIC | Status: AC | PRN
  Administered 2021-11-07 (×3): 1 [drp] via OPHTHALMIC

## 2021-11-07 MED ORDER — FENTANYL CITRATE (PF) 100 MCG/2ML IJ SOLN
INTRAMUSCULAR | Status: DC | PRN
  Administered 2021-11-07: 50 ug via INTRAVENOUS

## 2021-11-07 MED ORDER — SIGHTPATH DOSE#1 NA CHONDROIT SULF-NA HYALURON 40-17 MG/ML IO SOLN
INTRAOCULAR | Status: DC | PRN
  Administered 2021-11-07: 1 mL via INTRAOCULAR

## 2021-11-07 MED ORDER — SIGHTPATH DOSE#1 BSS IO SOLN
INTRAOCULAR | Status: DC | PRN
  Administered 2021-11-07: 1 mL via INTRAMUSCULAR

## 2021-11-07 SURGICAL SUPPLY — 9 items
CATARACT SUITE SIGHTPATH (MISCELLANEOUS) ×1 IMPLANT
FEE CATARACT SUITE SIGHTPATH (MISCELLANEOUS) ×1 IMPLANT
GLOVE SURG ENC TEXT LTX SZ8 (GLOVE) ×1 IMPLANT
GLOVE SURG TRIUMPH 8.0 PF LTX (GLOVE) ×1 IMPLANT
LENS IOL TECNIS EYHANCE 20.5 (Intraocular Lens) IMPLANT
NDL FILTER BLUNT 18X1 1/2 (NEEDLE) ×1 IMPLANT
NEEDLE FILTER BLUNT 18X1 1/2 (NEEDLE) ×1 IMPLANT
SYR 3ML LL SCALE MARK (SYRINGE) ×1 IMPLANT
WATER STERILE IRR 250ML POUR (IV SOLUTION) ×1 IMPLANT

## 2021-11-07 NOTE — Anesthesia Postprocedure Evaluation (Signed)
Anesthesia Post Note  Patient: Dorothy Church  Procedure(s) Performed: CATARACT EXTRACTION PHACO AND INTRAOCULAR LENS PLACEMENT (IOC) RIGHT  8.87 00:50.8 (Right: Eye)     Patient location during evaluation: PACU Anesthesia Type: MAC Level of consciousness: awake and alert Pain management: pain level controlled Vital Signs Assessment: post-procedure vital signs reviewed and stable Respiratory status: spontaneous breathing, nonlabored ventilation, respiratory function stable and patient connected to nasal cannula oxygen Cardiovascular status: stable and blood pressure returned to baseline Postop Assessment: no apparent nausea or vomiting Anesthetic complications: no   There were no known notable events for this encounter.  Molli Barrows

## 2021-11-07 NOTE — Transfer of Care (Signed)
Immediate Anesthesia Transfer of Care Note  Patient: Dorothy Church  Procedure(s) Performed: CATARACT EXTRACTION PHACO AND INTRAOCULAR LENS PLACEMENT (IOC) RIGHT  8.87 00:50.8 (Right: Eye)  Patient Location: PACU  Anesthesia Type: No value filed.  Level of Consciousness: awake, alert  and patient cooperative  Airway and Oxygen Therapy: Patient Spontanous Breathing and Patient connected to supplemental oxygen  Post-op Assessment: Post-op Vital signs reviewed, Patient's Cardiovascular Status Stable, Respiratory Function Stable, Patent Airway and No signs of Nausea or vomiting  Post-op Vital Signs: Reviewed and stable  Complications: There were no known notable events for this encounter.

## 2021-11-07 NOTE — H&P (Signed)
[facility]   Primary Care Physician:  Carollee Leitz, MD Ophthalmologist: Dr. Birdie Sons  Pre-Procedure History & Physical: HPI:  Dorothy Church is a 86 y.o. female here for cataract surgery.   Past Medical History:  Diagnosis Date   Abdominal pain    Abnormal CT of liver    Anxiety    Aortic stenosis    Aortic valve disorders    Arthritis    Asthma    Asthma 08/07/2012   Barrett's esophagus    [facility] last EGD 02/2018 Dr. Gustavo Lah    Barrett's esophagus 03/11/2017   Overview:  Dr Gustavo Lah   Colon polyps    COPD (chronic obstructive pulmonary disease) (HCC)    DDD (degenerative disc disease), cervical    Depressive disorder    Diverticulosis    Erosive esophagitis    Erosive esophagitis 04/28/2020   Fundic gland polyps of stomach, benign    Gastric polyp    Gastric polyps    Gastritis    GERD (gastroesophageal reflux disease)    Goiter    Hiatal hernia    small    Hiatal hernia    Hypercholesteremia    Hypothyroidism    Impaired fasting glucose    Intrinsic asthma, unspecified    Left rib fracture    Obstructive sleep apnea (adult) (pediatric)    used cpap x 10 years per Dr. Mortimer Fries off as of 12/30/18    OSA (obstructive sleep apnea)    used cpap x 10 years per Dr. Mortimer Fries off as of 12/30/18    Osteoporosis    Persistent cough 12/30/2018   Pneumonia    Pure hypercholesterolemia    Pure hypercholesterolemia 03/11/2017   Right shoulder pain 05/11/2019   Schatzki's ring    Schatzki's ring    Shingles    Syncope and collapse    TIA (transient ischemic attack)    TIA (transient ischemic attack) 12/11/2016   UTI (urinary tract infection)    Vitamin D deficiency     Past Surgical History:  Procedure Laterality Date   BREAST EXCISIONAL BIOPSY Left    BREAST SURGERY     COLONOSCOPY     COLONOSCOPY WITH ESOPHAGOGASTRODUODENOSCOPY (EGD)     COLONOSCOPY WITH PROPOFOL N/A 12/14/2014   Procedure: COLONOSCOPY WITH PROPOFOL;  Surgeon: Lollie Sails, MD;  Location:  [facility] ENDOSCOPY;  Service: Endoscopy;  Laterality: N/A;   COLONOSCOPY WITH PROPOFOL N/A 03/18/2018   Procedure: COLONOSCOPY WITH PROPOFOL;  Surgeon: Lollie Sails, MD;  Location: [facility] ENDOSCOPY;  Service: Endoscopy;  Laterality: N/A;   ENUCLEATION     ESOPHAGOGASTRODUODENOSCOPY N/A 08/30/2020   Procedure: ESOPHAGOGASTRODUODENOSCOPY (EGD);  Surgeon: Lesly Rubenstein, MD;  Location: [facility] ENDOSCOPY;  Service: Endoscopy;  Laterality: N/A;   ESOPHAGOGASTRODUODENOSCOPY (EGD) WITH PROPOFOL N/A 12/14/2014   Procedure: ESOPHAGOGASTRODUODENOSCOPY (EGD) WITH PROPOFOL;  Surgeon: Lollie Sails, MD;  Location: [facility] ENDOSCOPY;  Service: Endoscopy;  Laterality: N/A;   ESOPHAGOGASTRODUODENOSCOPY (EGD) WITH PROPOFOL N/A 10/18/2016   Procedure: ESOPHAGOGASTRODUODENOSCOPY (EGD) WITH PROPOFOL;  Surgeon: Lollie Sails, MD;  Location: [facility] ENDOSCOPY;  Service: Endoscopy;  Laterality: N/A;   ESOPHAGOGASTRODUODENOSCOPY (EGD) WITH PROPOFOL N/A 03/18/2018   Procedure: ESOPHAGOGASTRODUODENOSCOPY (EGD) WITH PROPOFOL;  Surgeon: Lollie Sails, MD;  Location: [facility] ENDOSCOPY;  Service: Endoscopy;  Laterality: N/A;   ESOPHAGOGASTRODUODENOSCOPY (EGD) WITH PROPOFOL N/A 12/19/2020   Procedure: ESOPHAGOGASTRODUODENOSCOPY (EGD) WITH PROPOFOL;  Surgeon: Lesly Rubenstein, MD;  Location: [facility] ENDOSCOPY;  Service: Endoscopy;  Laterality: N/A;   EYE SURGERY  FRACTURE SURGERY     HIP SURGERY Left    pin and plate   IR ANGIO INTRA EXTRACRAN SEL COM CAROTID INNOMINATE BILAT MOD SED  10/17/2018   IR ANGIO VERTEBRAL SEL VERTEBRAL BILAT MOD SED  10/17/2018   IR ANGIOGRAM EXTREMITY LEFT  10/17/2018   JOINT REPLACEMENT     Left Total Knee Arthroplasty     Dr Marry Guan and also had 39 years ago from 2020    nasal polyps     removed x 2 affects sense of smell x 10-15 years from 04/28/20   prosthesis eye implant     REPLACEMENT UNICONDYLAR JOINT KNEE Left    TONSILLECTOMY     TOTAL KNEE REVISION Left 09/13/2017    Procedure: TOTAL KNEE REVISION;  Surgeon: Dereck Leep, MD;  Location: [facility] ORS;  Service: Orthopedics;  Laterality: Left;    Prior to Admission medications   Medication Sig Start Date End Date Taking? Authorizing Provider  acetaminophen (TYLENOL) 500 MG tablet Take 500 mg by mouth every 6 (six) hours as needed (PAIN).   Yes [provider]  albuterol (VENTOLIN HFA) 108 (90 Base) MCG/ACT inhaler Inhale 2 puffs into the lungs every 6 (six) hours as needed for wheezing. 03/10/20  Yes Flora Lipps, MD  aspirin EC 81 MG tablet Take 1 tablet (81 mg total) by mouth daily. Swallow whole. 10/17/21  Yes Carollee Leitz, MD  budesonide-formoterol [facility]) 160-4.5 MCG/ACT inhaler Inhale 2 puffs into the lungs 2 (two) times daily. 03/10/21  Yes Martyn Ehrich, NP  calcium carbonate (TUMS - DOSED IN MG ELEMENTAL CALCIUM) 500 MG chewable tablet Chew 1 tablet by mouth 4 (four) times daily as needed for indigestion or heartburn.   Yes [provider]  Cholecalciferol (VITAMIN D3) 50 MCG (2000 UT) capsule Take 1 capsule (2,000 Units total) by mouth daily. 10/17/21  Yes Carollee Leitz, MD  diphenhydramine-acetaminophen (TYLENOL PM) 25-500 MG TABS tablet Take 1 tablet by mouth at bedtime as needed.   Yes [provider]  famotidine (PEPCID) 40 MG tablet Take 1 tablet (40 mg total) by mouth daily. 10/17/21  Yes Carollee Leitz, MD  fluticasone [facility]) 50 MCG/ACT nasal spray Place into both nostrils daily.   Yes [provider]  levothyroxine (SYNTHROID) 50 MCG tablet TAKE 1 TABLET BY MOUTH EVERY DAY BEFORE BREAKFAST 10/15/21  Yes Dutch Quint B, FNP  loratadine (CLARITIN) 10 MG tablet Take 1 tablet (10 mg total) by mouth daily as needed for allergies. 03/10/21  Yes Martyn Ehrich, NP  meloxicam (MOBIC) 15 MG tablet Take 1 tablet (15 mg total) by mouth daily. 10/17/21  Yes Edrick Kins, DPM  montelukast (SINGULAIR) 10 MG tablet Take 1 tablet (10 mg total) by mouth at bedtime.  03/10/21  Yes Martyn Ehrich, NP  PARoxetine (PAXIL) 30 MG tablet TAKE 1 TABLET BY MOUTH EVERY MORNING 08/11/20  Yes McLean-Scocuzza, Nino Glow, MD  simvastatin (ZOCOR) 20 MG tablet TAKE 1 TABLET BY MOUTH EVERYDAY AT BEDTIME 01/23/21  Yes McLean-Scocuzza, Nino Glow, MD  traZODone (DESYREL) 50 MG tablet TAKE 0.5-1 TABLETS BY MOUTH AT BEDTIME AS NEEDED FOR SLEEP. 10/16/21  Yes McLean-Scocuzza, Nino Glow, MD  diclofenac sodium (VOLTAREN) 1 % GEL Apply 4 g topically 4 (four) times daily. Patient taking differently: Apply 4 g topically 4 (four) times daily as needed (for pain). 02/24/15   Copland, Frederico Hamman, MD  latanoprost (XALATAN) 0.005 % ophthalmic solution  12/23/19   [provider]  Polyvinyl Alcohol-Povidone PF 1.4-0.6 %  SOLN Place 2 drops into both eyes 2 (two) times daily as needed (for dry eyes).    [provider]    Allergies as of 10/10/2021 - Review Complete 05/30/2021  Allergen Reaction Noted   Amoxicillin Other (See Comments) 08/07/2012   Cefzil [cefprozil] Other (See Comments) 08/07/2012   Cephalexin Other (See Comments) 08/07/2012   Propoxyphene Other (See Comments) 04/27/2014   Relafen [nabumetone] Other (See Comments) 08/07/2012   Talwin [pentazocine] Other (See Comments) 08/07/2012   Penicillins Other (See Comments) 06/16/2015    Family History  Problem Relation Age of Onset   Breast cancer Mother        pagets dx 86s   Heart disease Father    Alcohol abuse Father    Dementia Sister        twin sister 77 dx'ed dementia   Bipolar disorder Brother     Social History   Socioeconomic History   Marital status: Divorced    Spouse name: Not on file   Number of children: Not on file   Years of education: Not on file   Highest education level: Not on file  Occupational History   Not on file  Tobacco Use   Smoking status: Never   Smokeless tobacco: Never  Vaping Use   Vaping Use: Never used  Substance and Sexual Activity   Alcohol use: Yes     Alcohol/week: 1.0 standard drink of alcohol    Types: 1 Glasses of wine per week    Comment: occas. wine   Drug use: No   Sexual activity: Never  Other Topics Concern   Not on file  Social History Narrative   Has twin sister identical lives in [place] does not live with her    Lived in [place], moved to [place] two years ago.   Divorced.      Has a living will.   Would desire CPR, would not desire extraordinary measures.      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   Social Determinants of Health   Financial Resource Strain: [place]  (05/30/2021)   Overall Financial Resource Strain (CARDIA)    Difficulty of Paying Living Expenses: Not hard at all  Food Insecurity: No Food Insecurity (05/30/2021)   Hunger Vital Sign    Worried About Running Out of Food in the Last Year: Never true    Ran Out of Food in the Last Year: Never true  Transportation Needs: No Transportation Needs (05/30/2021)   PRAPARE - Hydrologist (Medical): No    Lack of Transportation (Non-Medical): No  Physical Activity: Insufficiently Active (05/30/2021)   Exercise Vital Sign    Days of Exercise per Week: 5 days    Minutes of Exercise per Session: 20 min  Stress: No Stress Concern Present (05/30/2021)   [place]    Feeling of Stress : Not at all  Social Connections: Unknown (05/30/2021)   Social Connection and Isolation Panel [NHANES]    Frequency of Communication with Friends and Family: More than three times a week    Frequency of Social Gatherings with Friends and Family: More than three times a week    Attends Religious Services: Not on file    Active Member of Clubs or Organizations: Not on file    Attends Archivist Meetings: Not on file    Marital Status: Not on  file  Intimate Partner Violence: Not At Risk (05/30/2021)   Humiliation, Afraid, Rape,  and Kick questionnaire    Fear of Current or Ex-Partner: No    Emotionally Abused: No    Physically Abused: No    Sexually Abused: No    Review of Systems: See HPI, otherwise negative ROS  Physical Exam: BP 124/68   Pulse 86   Temp (!) 97.3 F (36.3 C) (Temporal)   Ht '5\' 6"'$  (1.676 m)   Wt 61.2 kg   SpO2 96%   BMI 21.77 kg/m  General:   Alert, cooperative in NAD Head:  Normocephalic and atraumatic. Respiratory:  Normal work of breathing. Cardiovascular:  RRR  Impression/Plan: Dorothy Church is here for cataract surgery.  Risks, benefits, limitations, and alternatives regarding cataract surgery have been reviewed with the patient.  Questions have been answered.  All parties agreeable.   Birder Robson, MD  11/07/2021, 11:56 AM

## 2021-11-07 NOTE — Op Note (Signed)
PREOPERATIVE DIAGNOSIS:  Nuclear sclerotic cataract of the right eye.   POSTOPERATIVE DIAGNOSIS:  H25.11 Cataract   OPERATIVE PROCEDURE:ORPROCALL@   SURGEON:  Birder Robson, MD.   ANESTHESIA:  Anesthesiologist: Molli Barrows, MD CRNA: Ester Rink, CRNA  1.      Managed anesthesia care. 2.      0.74m of Shugarcaine was instilled in the eye following the paracentesis.   COMPLICATIONS:  None.   TECHNIQUE:   Stop and chop   DESCRIPTION OF PROCEDURE:  The patient was examined and consented in the preoperative holding area where the aforementioned topical anesthesia was applied to the right eye and then brought back to the Operating Room where the right eye was prepped and draped in the usual sterile ophthalmic fashion and a lid speculum was placed. A paracentesis was created with the side port blade and the anterior chamber was filled with viscoelastic. A near clear corneal incision was performed with the steel keratome. A continuous curvilinear capsulorrhexis was performed with a cystotome followed by the capsulorrhexis forceps. Hydrodissection and hydrodelineation were carried out with BSS on a blunt cannula. The lens was removed in a stop and chop  technique and the remaining cortical material was removed with the irrigation-aspiration handpiece. The capsular bag was inflated with viscoelastic and the Technis [id number]  lens was placed in the capsular bag without complication. The remaining viscoelastic was removed from the eye with the irrigation-aspiration handpiece. The wounds were hydrated. The anterior chamber was flushed with BSS and the eye was inflated to physiologic pressure. 0.115mof Vigamox was placed in the anterior chamber. The wounds were found to be water tight. The eye was dressed with Combigan. The patient was given protective glasses to wear throughout the day and a shield with which to sleep tonight. The patient was also given drops with which to begin a drop regimen today and  will follow-up with me in one day. Implant Name Type Inv. Item Serial No. Manufacturer Lot No. LRB No. Used Action  LENS IOL TECNIS EYHANCE 20.5 - [id number] Lens LENS IOL TECNIS EYHANCE 20.5 [id number]  Right 1 Implanted   Procedure(s): CATARACT EXTRACTION PHACO AND INTRAOCULAR LENS PLACEMENT (IOC) RIGHT  8.87 00:50.8 (Right)  Electronically signed: WiBirder Robson/19/2023 12:22 PM

## 2021-11-07 NOTE — Anesthesia Preprocedure Evaluation (Signed)
Anesthesia Evaluation  Patient identified by MRN, date of birth, ID band Patient awake    Reviewed: Allergy & Precautions, H&P , NPO status , Patient's Chart, lab work & pertinent test results, reviewed documented beta blocker date and time   Airway Mallampati: II  TM Distance: >3 FB Neck ROM: full    Dental no notable dental hx. (+) Teeth Intact   Pulmonary asthma , sleep apnea , pneumonia, COPD,    Pulmonary exam normal breath sounds clear to auscultation       Cardiovascular Exercise Tolerance: Good hypertension, negative cardio ROS   Rhythm:regular Rate:Normal     Neuro/Psych PSYCHIATRIC DISORDERS Anxiety Depression TIA Neuromuscular disease    GI/Hepatic Neg liver ROS, hiatal hernia, PUD, GERD  ,  Endo/Other  diabetes, Well ControlledHypothyroidism   Renal/GU      Musculoskeletal   Abdominal   Peds  Hematology negative hematology ROS (+)   Anesthesia Other Findings   Reproductive/Obstetrics negative OB ROS                             Anesthesia Physical Anesthesia Plan  ASA: 3  Anesthesia Plan: MAC   Post-op Pain Management:    Induction:   PONV Risk Score and Plan:   Airway Management Planned:   Additional Equipment:   Intra-op Plan:   Post-operative Plan:   Informed Consent: I have reviewed the patients History and Physical, chart, labs and discussed the procedure including the risks, benefits and alternatives for the proposed anesthesia with the patient or authorized representative who has indicated his/her understanding and acceptance.       Plan Discussed with: CRNA  Anesthesia Plan Comments:         Anesthesia Quick Evaluation

## 2021-11-08 ENCOUNTER — Encounter: Payer: Self-pay | Admitting: Ophthalmology

## 2021-11-20 DIAGNOSIS — M1711 Unilateral primary osteoarthritis, right knee: Secondary | ICD-10-CM | POA: Diagnosis not present

## 2021-12-04 DIAGNOSIS — Z961 Presence of intraocular lens: Secondary | ICD-10-CM | POA: Diagnosis not present

## 2021-12-13 DIAGNOSIS — M810 Age-related osteoporosis without current pathological fracture: Secondary | ICD-10-CM | POA: Diagnosis not present

## 2021-12-18 ENCOUNTER — Ambulatory Visit (INDEPENDENT_AMBULATORY_CARE_PROVIDER_SITE_OTHER): Payer: Medicare HMO | Admitting: Podiatry

## 2021-12-18 DIAGNOSIS — M7672 Peroneal tendinitis, left leg: Secondary | ICD-10-CM

## 2021-12-18 DIAGNOSIS — E039 Hypothyroidism, unspecified: Secondary | ICD-10-CM | POA: Diagnosis not present

## 2021-12-18 DIAGNOSIS — M810 Age-related osteoporosis without current pathological fracture: Secondary | ICD-10-CM | POA: Diagnosis not present

## 2021-12-18 DIAGNOSIS — E559 Vitamin D deficiency, unspecified: Secondary | ICD-10-CM | POA: Diagnosis not present

## 2021-12-18 MED ORDER — MELOXICAM 15 MG PO TABS: 15.0000 mg | ORAL_TABLET | Freq: Every day | ORAL | 1 refills | Status: DC

## 2021-12-18 MED ORDER — BETAMETHASONE SOD PHOS & ACET 6 (3-3) MG/ML IJ SUSP
3.0000 mg | Freq: Once | INTRAMUSCULAR | Status: AC
  Administered 2021-12-18: 3 mg via INTRA_ARTICULAR

## 2021-12-18 NOTE — Progress Notes (Signed)
Chief Complaint  Patient presents with   Foot Pain    Pt stated that she still has some discomfort but is going out of town and wanted to have her foot looked at     HPI: 86 y.o. female presenting today for follow-up evaluation of insertional peroneal tendinitis to the left foot.  Patient states the injection that she received a few months ago helped significantly with the pain but is slowly returned.  She is very active and likes to exercise and walk.  She cannot recall taking the meloxicam that was prescribed.  Past Medical History:  Diagnosis Date   Abdominal pain    Abnormal CT of liver    Anxiety    Aortic stenosis    Aortic valve disorders    Arthritis    Asthma    Asthma 08/07/2012   Barrett's esophagus    [facility] last EGD 02/2018 Dr. Gustavo Lah    Barrett's esophagus 03/11/2017   Overview:  Dr Gustavo Lah   Colon polyps    COPD (chronic obstructive pulmonary disease) (HCC)    DDD (degenerative disc disease), cervical    Depressive disorder    Diverticulosis    Erosive esophagitis    Erosive esophagitis 04/28/2020   Fundic gland polyps of stomach, benign    Gastric polyp    Gastric polyps    Gastritis    GERD (gastroesophageal reflux disease)    Goiter    Hiatal hernia    small    Hiatal hernia    Hypercholesteremia    Hypothyroidism    Impaired fasting glucose    Intrinsic asthma, unspecified    Left rib fracture    Obstructive sleep apnea (adult) (pediatric)    used cpap x 10 years per Dr. Mortimer Fries off as of 12/30/18    OSA (obstructive sleep apnea)    used cpap x 10 years per Dr. Mortimer Fries off as of 12/30/18    Osteoporosis    Persistent cough 12/30/2018   Pneumonia    Pure hypercholesterolemia    Pure hypercholesterolemia 03/11/2017   Right shoulder pain 05/11/2019   Schatzki's ring    Schatzki's ring    Shingles    Syncope and collapse    TIA (transient ischemic attack)    TIA (transient ischemic attack) 12/11/2016   UTI (urinary tract infection)    Vitamin D  deficiency     Past Surgical History:  Procedure Laterality Date   BREAST EXCISIONAL BIOPSY Left    BREAST SURGERY     CATARACT EXTRACTION W/PHACO Right 11/07/2021   Procedure: CATARACT EXTRACTION PHACO AND INTRAOCULAR LENS PLACEMENT (IOC) RIGHT  8.87 00:50.8;  Surgeon: Birder Robson, MD;  Location: [place];  Service: Ophthalmology;  Laterality: Right;   COLONOSCOPY     COLONOSCOPY WITH ESOPHAGOGASTRODUODENOSCOPY (EGD)     COLONOSCOPY WITH PROPOFOL N/A 12/14/2014   Procedure: COLONOSCOPY WITH PROPOFOL;  Surgeon: Lollie Sails, MD;  Location: [facility] ENDOSCOPY;  Service: Endoscopy;  Laterality: N/A;   COLONOSCOPY WITH PROPOFOL N/A 03/18/2018   Procedure: COLONOSCOPY WITH PROPOFOL;  Surgeon: Lollie Sails, MD;  Location: [facility] ENDOSCOPY;  Service: Endoscopy;  Laterality: N/A;   ENUCLEATION     ESOPHAGOGASTRODUODENOSCOPY N/A 08/30/2020   Procedure: ESOPHAGOGASTRODUODENOSCOPY (EGD);  Surgeon: Lesly Rubenstein, MD;  Location: [facility] ENDOSCOPY;  Service: Endoscopy;  Laterality: N/A;   ESOPHAGOGASTRODUODENOSCOPY (EGD) WITH PROPOFOL N/A 12/14/2014   Procedure: ESOPHAGOGASTRODUODENOSCOPY (EGD) WITH PROPOFOL;  Surgeon: Lollie Sails, MD;  Location: [facility] ENDOSCOPY;  Service: Endoscopy;  Laterality: N/A;   ESOPHAGOGASTRODUODENOSCOPY (EGD) WITH PROPOFOL N/A 10/18/2016   Procedure: ESOPHAGOGASTRODUODENOSCOPY (EGD) WITH PROPOFOL;  Surgeon: Lollie Sails, MD;  Location: [facility] ENDOSCOPY;  Service: Endoscopy;  Laterality: N/A;   ESOPHAGOGASTRODUODENOSCOPY (EGD) WITH PROPOFOL N/A 03/18/2018   Procedure: ESOPHAGOGASTRODUODENOSCOPY (EGD) WITH PROPOFOL;  Surgeon: Lollie Sails, MD;  Location: [facility] ENDOSCOPY;  Service: Endoscopy;  Laterality: N/A;   ESOPHAGOGASTRODUODENOSCOPY (EGD) WITH PROPOFOL N/A 12/19/2020   Procedure: ESOPHAGOGASTRODUODENOSCOPY (EGD) WITH PROPOFOL;  Surgeon: Lesly Rubenstein, MD;  Location: [facility] ENDOSCOPY;  Service: Endoscopy;  Laterality: N/A;   EYE  SURGERY     FRACTURE SURGERY     HIP SURGERY Left    pin and plate   IR ANGIO INTRA EXTRACRAN SEL COM CAROTID INNOMINATE BILAT MOD SED  10/17/2018   IR ANGIO VERTEBRAL SEL VERTEBRAL BILAT MOD SED  10/17/2018   IR ANGIOGRAM EXTREMITY LEFT  10/17/2018   JOINT REPLACEMENT     Left Total Knee Arthroplasty     Dr Marry Guan and also had 39 years ago from 2020    nasal polyps     removed x 2 affects sense of smell x 10-15 years from 04/28/20   prosthesis eye implant     REPLACEMENT UNICONDYLAR JOINT KNEE Left    TONSILLECTOMY     TOTAL KNEE REVISION Left 09/13/2017   Procedure: TOTAL KNEE REVISION;  Surgeon: Dereck Leep, MD;  Location: [facility] ORS;  Service: Orthopedics;  Laterality: Left;    Allergies  Allergen Reactions   Amoxicillin Other (See Comments)    Pencillins   Cefzil [Cefprozil] Other (See Comments)    Unknown   Cephalexin Other (See Comments)    Unknown   Propoxyphene Other (See Comments)    Unknown   Relafen [Nabumetone] Other (See Comments)    Unknown   Talwin [Pentazocine] Other (See Comments)    Unknown   Penicillins Other (See Comments)    Cannot remember reaction     Physical Exam: General: The patient is alert and oriented x3 in no acute distress.  Dermatology: Skin is warm, dry and supple bilateral lower extremities. Negative for open lesions or macerations.  Vascular: Palpable pedal pulses bilaterally. Capillary refill within normal limits.  Negative for any significant edema or erythema  Neurological: Light touch and protective threshold grossly intact  Musculoskeletal Exam: No pedal deformities noted.  Today there is pain on palpation just prior small to the fifth metatarsal tubercle of the left foot associated to insertional peroneal tendinitis  Radiographic Exam LT ankle 10/17/2021:  Diffuse osteopenia noted which is WNL given the patient's age.  No acute fractures identified.  There is some joint space narrowing to the subtalar joint best visualized on  lateral view.  Tibiotalar joint congruent.  Assessment: 1.  Insert general peroneal tendinitis left   Plan of Care:  1. Patient evaluated. X-Rays reviewed.  2.  Injection of 0.5 cc Celestone Soluspan injected into the peroneal tendon sheath left 3.  Recommend Vionic shoes and sandals.  Patient states that she wears bionics currently 4.  Prescription for meloxicam 15 mg daily 5.  Return to clinic as needed  *Going to [place], [place] to visit her daughter     Edrick Kins, [facility]  Dr. Edrick Kins, DPM    [address]  [place], [place] [address]                Office [phone]  Fax [phone]

## 2021-12-26 DIAGNOSIS — Y92015 Private garage of single-family (private) house as the place of occurrence of the external cause: Secondary | ICD-10-CM | POA: Diagnosis not present

## 2021-12-26 DIAGNOSIS — M47812 Spondylosis without myelopathy or radiculopathy, cervical region: Secondary | ICD-10-CM | POA: Diagnosis not present

## 2021-12-26 DIAGNOSIS — Z9181 History of falling: Secondary | ICD-10-CM | POA: Diagnosis not present

## 2021-12-26 DIAGNOSIS — W1830XA Fall on same level, unspecified, initial encounter: Secondary | ICD-10-CM | POA: Diagnosis not present

## 2021-12-26 DIAGNOSIS — S0003XA Contusion of scalp, initial encounter: Secondary | ICD-10-CM | POA: Diagnosis not present

## 2021-12-26 DIAGNOSIS — S0990XA Unspecified injury of head, initial encounter: Secondary | ICD-10-CM | POA: Diagnosis not present

## 2021-12-26 DIAGNOSIS — S199XXA Unspecified injury of neck, initial encounter: Secondary | ICD-10-CM | POA: Diagnosis not present

## 2022-01-03 DIAGNOSIS — M810 Age-related osteoporosis without current pathological fracture: Secondary | ICD-10-CM | POA: Diagnosis not present

## 2022-01-12 ENCOUNTER — Other Ambulatory Visit: Payer: Self-pay | Admitting: Family Medicine

## 2022-01-12 DIAGNOSIS — K219 Gastro-esophageal reflux disease without esophagitis: Secondary | ICD-10-CM

## 2022-01-15 NOTE — Telephone Encounter (Signed)
Refill request

## 2022-01-17 ENCOUNTER — Ambulatory Visit (INDEPENDENT_AMBULATORY_CARE_PROVIDER_SITE_OTHER): Payer: Medicare HMO | Admitting: Family Medicine

## 2022-01-17 ENCOUNTER — Encounter: Payer: Self-pay | Admitting: Family Medicine

## 2022-01-17 VITALS — BP 130/80 | HR 64 | Temp 97.8°F | Ht 66.0 in | Wt 138.8 lb

## 2022-01-17 DIAGNOSIS — W19XXXA Unspecified fall, initial encounter: Secondary | ICD-10-CM

## 2022-01-17 DIAGNOSIS — F419 Anxiety disorder, unspecified: Secondary | ICD-10-CM

## 2022-01-17 DIAGNOSIS — G4700 Insomnia, unspecified: Secondary | ICD-10-CM

## 2022-01-17 DIAGNOSIS — R69 Illness, unspecified: Secondary | ICD-10-CM | POA: Diagnosis not present

## 2022-01-17 DIAGNOSIS — F339 Major depressive disorder, recurrent, unspecified: Secondary | ICD-10-CM

## 2022-01-17 DIAGNOSIS — F5104 Psychophysiologic insomnia: Secondary | ICD-10-CM

## 2022-01-17 MED ORDER — PAROXETINE HCL 20 MG PO TABS: 20.0000 mg | ORAL_TABLET | Freq: Every day | ORAL | 0 refills | Status: DC

## 2022-01-17 MED ORDER — TRAZODONE HCL 50 MG PO TABS: 25.0000 mg | ORAL_TABLET | Freq: Every day | ORAL | 3 refills | Status: DC

## 2022-01-17 NOTE — Progress Notes (Signed)
SUBJECTIVE:   Chief Complaint  Patient presents with   Follow-up    3-4 months/ fell   HPI Presents for follow up for mood/disorder. Seen in clinic on 08/29 and recommendations made to decrease trazodone, improve sleep hygiene and discussed possible weaning Prozac.  Since then patient reports having decreased trazodone to 50 mg at night.  She reports she feels like she sleeps too much.  She is not taking Tylenol PM at night.Patient reports she has been on Prozac for years when she was going through in her life where she was depressed.  She reports that she would like to try and come off the medication as she feels that she no longer needs it.  Recent fall Patient was recently visiting family in [place].  Had mechanical fall, was seen in an emergency and evaluated.  CT spine negative for acute fracture, CT brain did not show any acute intracranial abnormalities.  PERTINENT PMH / PSH: Mood disorder Sleep disorder   OBJECTIVE:  BP 130/80   Pulse 64   Temp 97.8 F (36.6 C) (Oral)   Ht '5\' 6"'$  (1.676 m)   Wt 138 lb 12.8 oz (63 kg)   SpO2 96%   BMI 22.40 kg/m    Physical Exam Vitals reviewed.  Constitutional:      General: She is not in acute distress.    Appearance: Normal appearance. She is normal weight. She is not ill-appearing, toxic-appearing or diaphoretic.  Eyes:     General:        Right eye: No discharge.        Left eye: No discharge.     Conjunctiva/sclera: Conjunctivae normal.  Cardiovascular:     Rate and Rhythm: Normal rate and regular rhythm.  Pulmonary:     Effort: Pulmonary effort is normal.  Musculoskeletal:        General: Normal range of motion.  Skin:    General: Skin is warm and dry.  Neurological:     General: No focal deficit present.     Mental Status: She is alert and oriented to person, place, and time. Mental status is at baseline.     Cranial Nerves: No cranial nerve deficit.     Motor: No weakness.     Gait: Gait normal.  Psychiatric:         Mood and Affect: Mood normal.        Behavior: Behavior normal.        Thought Content: Thought content normal.        Judgment: Judgment normal.     ASSESSMENT/PLAN:  Depression, recurrent (HCC) Assessment & Plan: PHQ 2 screening negative.  Currently on Paxil 30 mg daily, would like to start weaning medication. -Decrease Paxil to 20 mg daily -Follow-up in 2 weeks, if needing to restart medication would recommend initiation of citalopram and continue to wean Paxil   Orders: -     PARoxetine HCl; Take 1 tablet (20 mg total) by mouth daily.  Dispense: 30 tablet; Refill: 0  Insomnia, unspecified type Assessment & Plan: Reports sleeping too much now.  Has continue Trazodone 50 mg qhs, has stopped Tylenol PM. -Continue sleep hygiene -Decrease trazodone to 25 mg at night and only use as needed -Follow-up in 2 weeks  Orders: -     traZODone HCl; Take 0.5 tablets (25 mg total) by mouth at bedtime.  Dispense: 90 tablet; Refill: 3  Accident due to mechanical fall without injury, initial encounter Assessment & Plan: Recent mechanical fall,  after tripping over an object.  Was evaluated emergent care in [place].  Imaging completed, CT head and C-spine did not show any acute fractures or acute intracranial abnormalities.  Patient reports doing well since incident.  Takes ASA 81 mg daily.  Continue to decrease medications that can cause increase falls and sedation.  Patient agreeable to plan.    PDMP reviewed  Return in about 2 weeks (around 01/31/2022) for mood.  Carollee Leitz, MD

## 2022-01-17 NOTE — Patient Instructions (Addendum)
It was a pleasure meeting you today. Thank you for allowing me to take part in your health care.  Our goals for today as we discussed include:  For your mood Decrease Paxil to 20 mg daily   For your sleep Take acetaminophen 500 mg at night if this helps you sleep Do not take Tylenol PM at night. Decrease trazodone to 25 mg at night.  Take half a tablet.  Stop meloxicam  Continue Pepcid 40 mg daily.     If you have any questions or concerns, please do not hesitate to call the office at [phone].  I look forward to our next visit and until then take care and stay safe.  Regards,   Carollee Leitz, MD   [facility]

## 2022-01-28 ENCOUNTER — Encounter: Payer: Self-pay | Admitting: Family Medicine

## 2022-01-28 DIAGNOSIS — W19XXXA Unspecified fall, initial encounter: Secondary | ICD-10-CM | POA: Insufficient documentation

## 2022-01-28 HISTORY — DX: Unspecified fall, initial encounter: W19.XXXA

## 2022-01-28 NOTE — Assessment & Plan Note (Addendum)
Recent mechanical fall, after tripping over an object.  Was evaluated emergent care in [place].  Imaging completed, CT head and C-spine did not show any acute fractures or acute intracranial abnormalities.  Patient reports doing well since incident.  Takes ASA 81 mg daily.  Continue to decrease medications that can cause increase falls and sedation.  Patient agreeable to plan.

## 2022-01-28 NOTE — Assessment & Plan Note (Signed)
Reports sleeping too much now.  Has continue Trazodone 50 mg qhs, has stopped Tylenol PM. -Continue sleep hygiene -Decrease trazodone to 25 mg at night and only use as needed -Follow-up in 2 weeks

## 2022-01-28 NOTE — Assessment & Plan Note (Addendum)
PHQ 2 screening negative.  Currently on Paxil 30 mg daily, would like to start weaning medication. -Decrease Paxil to 20 mg daily -Follow-up in 2 weeks, if needing to restart medication would recommend initiation of citalopram and continue to wean Paxil

## 2022-02-04 NOTE — Progress Notes (Signed)
   SUBJECTIVE:  No chief complaint on file.  HPI Presents for follow up for medication decrease. Seen in clinic on 11/29 and Paxil decreased to 20 mg daily.  Since then patient reports improvement in symptoms ***. Associated symptoms include ***.   PERTINENT PMH / PSH: ***  OBJECTIVE:  There were no vitals taken for this visit.   Physical Exam  ASSESSMENT/PLAN:  There are no diagnoses linked to this encounter. PDMP reviewed***  No follow-ups on file.  Carollee Leitz, MD

## 2022-02-05 ENCOUNTER — Other Ambulatory Visit: Payer: Self-pay | Admitting: Family Medicine

## 2022-02-05 ENCOUNTER — Ambulatory Visit (INDEPENDENT_AMBULATORY_CARE_PROVIDER_SITE_OTHER): Payer: Medicare HMO | Admitting: Family Medicine

## 2022-02-05 ENCOUNTER — Encounter: Payer: Self-pay | Admitting: Family Medicine

## 2022-02-05 VITALS — BP 118/76 | HR 86 | Temp 97.6°F | Ht 66.0 in | Wt 138.2 lb

## 2022-02-05 DIAGNOSIS — G4700 Insomnia, unspecified: Secondary | ICD-10-CM | POA: Diagnosis not present

## 2022-02-05 DIAGNOSIS — F339 Major depressive disorder, recurrent, unspecified: Secondary | ICD-10-CM | POA: Diagnosis not present

## 2022-02-05 DIAGNOSIS — R69 Illness, unspecified: Secondary | ICD-10-CM | POA: Diagnosis not present

## 2022-02-05 MED ORDER — RAMELTEON 8 MG PO TABS: 4.0000 mg | ORAL_TABLET | Freq: Every day | ORAL | 0 refills | Status: DC

## 2022-02-05 MED ORDER — PAROXETINE HCL 10 MG PO TABS: 20.0000 mg | ORAL_TABLET | Freq: Every day | ORAL | Status: DC

## 2022-02-05 NOTE — Patient Instructions (Addendum)
It was a pleasure meeting you today. Thank you for allowing me to take part in your health care.  Our goals for today as we discussed include:  For your mood Decrease Paxil to 10 mg daily for 2 weeks. Then If doing well take Paxil 10 mg every other day for 2 weeks. Then If doing well take Paxil 10 mg every 3 days for 2 weeks. Then If doing well take Paxil 10 mg every 4 days for 1 week  Then If doing well take Paxil 10 mg every 5 days for 1 week Then If doing well discontinue Paxil  If at any point during the weaning process you feel unwell please notify MD and we can increase medication or switch to different medication called citalopram.   For your sleep Start with ramelteon 4 mg at night if not effective can take an additional half tablet Can increase to 8 mg at night. Can increase Tylenol to 1 mg at night.  Take two 500 mg tablets.   If you have any questions or concerns, please do not hesitate to call the office at [phone].  I look forward to our next visit and until then take care and stay safe.  Regards,   Carollee Leitz, MD   [facility]

## 2022-02-05 NOTE — Assessment & Plan Note (Signed)
Chronic.  Asymptomatic.  Currently on Paxil 20 mg daily, would like to continue weaning medication. Decrease Paxil to 10 mg daily for 2 weeks. Then If doing well take Paxil 10 mg every other day for 2 weeks. Then If doing well take Paxil 10 mg every 3 days for 2 weeks. Then If doing well take Paxil 10 mg every 4 days for 1 week  Then If doing well take Paxil 10 mg every 5 days for 1 week Then If doing well discontinue Paxil Follow-up in 4 weeks.

## 2022-02-05 NOTE — Telephone Encounter (Signed)
Please let patient know that medication not covered.  She can use Good Rx coupon at [facility] and get script for 45.99.  if she goes there.  If not she would have to check with her pharmacy to find out cost and if she wants to proceed.

## 2022-02-05 NOTE — Assessment & Plan Note (Signed)
Has difficulty getting to sleep but no problems remaining asleep. -Continue sleep hygiene -Start ramelteon 4-8 mg nightly, if not improving can continue to use trazodone to 25 mg at night and only use as needed

## 2022-02-06 ENCOUNTER — Encounter: Payer: Medicare HMO | Admitting: Family

## 2022-02-07 ENCOUNTER — Ambulatory Visit: Payer: Medicare HMO | Admitting: Student

## 2022-02-07 DIAGNOSIS — H6122 Impacted cerumen, left ear: Secondary | ICD-10-CM

## 2022-02-07 MED ORDER — DEBROX 6.5 % OT SOLN: 5.0000 [drp] | Freq: Two times a day (BID) | OTIC | 0 refills | Status: AC

## 2022-02-07 NOTE — Patient Instructions (Signed)
Nice to meet you!   Please apply 5 drops in the left ear twice daily for the next three days then return for flushing of the ear!  Happy Holidays!

## 2022-02-07 NOTE — Progress Notes (Unsigned)
Location:  [facility]   Place of Service:   [facility]  Provider: Unk Lightning  Code Status: Full Code Goals of Care:     11/07/2021   11:25 AM  Advanced Directives  Does Patient Have a Medical Advance Directive? Yes  Type of Paramedic of [place];Living will  Does patient want to make changes to medical advance directive? No - Patient declined  Copy of [place] in Chart? Yes - validated most recent copy scanned in chart (See row information)     Chief Complaint  Patient presents with   Acute Visit    Left ear feels clogged    HPI: Patient is a 86 y.o. female seen today for an acute visit for  Her HA fell apart about a week and she is concerned that she has a piece stuck in her ear She last saw the audiologist when she comes to [place] so it would have been 01/2022.   She has been at [facility] for 11 years and is originally from [place]. 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 She is a twin to long term resident at [facility], [employer]   Past Medical History:  Diagnosis Date   Abdominal pain    Abnormal CT of liver    Accident due to mechanical fall without injury 01/28/2022   Acute pain of left shoulder 09/28/2020   Anxiety    Anxiety 04/28/2020   Aortic stenosis    Aortic stenosis 03/04/2012   Overview:   Not confirmed by Dr Rockey Situ.    Aortic valve disorders    Arthritis    Asthma    Asthma 08/07/2012   Barrett's esophagus    [facility] last EGD 02/2018 Dr. Gustavo Lah    Barrett's esophagus 03/11/2017   Overview:  Dr Gustavo Lah   Carotid artery stenosis 12/30/2018   1-39% b/l carotids       Colon polyps    Colon polyps    COPD (chronic obstructive pulmonary disease) (HCC)    DDD (degenerative disc disease), cervical    DDD (degenerative disc disease), cervical 04/28/2020   DDD (degenerative disc disease), lumbar 01/22/2017   Depressive disorder    Diverticulosis     Erosive esophagitis    Erosive esophagitis 04/28/2020   Fundic gland polyps of stomach, benign    Gastric polyp    Gastric polyps    Gastritis    Generalized osteoarthritis of hand 05/03/2015   GERD (gastroesophageal reflux disease)    GOA (generalized osteoarthritis) 11/23/2015   Goiter    Hiatal hernia    small    Hiatal hernia    History of colonic polyps 12/08/2013   Hypercholesteremia    Hypothyroidism    Impaired fasting glucose    Impaired fasting glucose 03/04/2012   Overview:   Glucose 111 02/2012   Intrinsic asthma, unspecified    Joint pain of ankle and foot, right 05/17/2015   Left rib fracture    Mechanical complication of knee prosthesis, initial encounter ([place]) 08/25/2017   Obstructive sleep apnea (adult) (pediatric)    used cpap x 10 years per Dr. Mortimer Fries off as of 12/30/18    OSA (obstructive sleep apnea)    used cpap x 10 years per Dr. 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   Stress 04/28/2020   Syncope and collapse    TIA (transient ischemic attack)    TIA (transient ischemic attack) 12/11/2016   TIA (transient ischemic attack) 12/11/2016   UTI (urinary tract infection)    Vitamin D deficiency     Past Surgical History:  Procedure Laterality Date   BREAST EXCISIONAL BIOPSY Left    BREAST SURGERY     CATARACT EXTRACTION W/PHACO Right 11/07/2021   Procedure: CATARACT EXTRACTION PHACO AND INTRAOCULAR LENS PLACEMENT (IOC) RIGHT  8.87 00:50.8;  Surgeon: Birder Robson, MD;  Location: [facility];  Service: Ophthalmology;  Laterality: Right;   COLONOSCOPY     COLONOSCOPY WITH ESOPHAGOGASTRODUODENOSCOPY (EGD)     COLONOSCOPY WITH PROPOFOL N/A 12/14/2014    Procedure: COLONOSCOPY WITH PROPOFOL;  Surgeon: Lollie Sails, MD;  Location: [facility] ENDOSCOPY;  Service: Endoscopy;  Laterality: N/A;   COLONOSCOPY WITH PROPOFOL N/A 03/18/2018   Procedure: COLONOSCOPY WITH PROPOFOL;  Surgeon: Lollie Sails, MD;  Location: [facility] ENDOSCOPY;  Service: Endoscopy;  Laterality: N/A;   ENUCLEATION     ESOPHAGOGASTRODUODENOSCOPY N/A 08/30/2020   Procedure: ESOPHAGOGASTRODUODENOSCOPY (EGD);  Surgeon: Lesly Rubenstein, MD;  Location: [facility] ENDOSCOPY;  Service: Endoscopy;  Laterality: N/A;   ESOPHAGOGASTRODUODENOSCOPY (EGD) WITH PROPOFOL N/A 12/14/2014   Procedure: ESOPHAGOGASTRODUODENOSCOPY (EGD) WITH PROPOFOL;  Surgeon: Lollie Sails, MD;  Location: [facility] ENDOSCOPY;  Service: Endoscopy;  Laterality: N/A;   ESOPHAGOGASTRODUODENOSCOPY (EGD) WITH PROPOFOL N/A 10/18/2016   Procedure: ESOPHAGOGASTRODUODENOSCOPY (EGD) WITH PROPOFOL;  Surgeon: Lollie Sails, MD;  Location: [facility] ENDOSCOPY;  Service: Endoscopy;  Laterality: N/A;   ESOPHAGOGASTRODUODENOSCOPY (EGD) WITH PROPOFOL N/A 03/18/2018   Procedure: ESOPHAGOGASTRODUODENOSCOPY (EGD) WITH PROPOFOL;  Surgeon: Lollie Sails, MD;  Location: [facility] ENDOSCOPY;  Service: Endoscopy;  Laterality: N/A;   ESOPHAGOGASTRODUODENOSCOPY (EGD) WITH PROPOFOL N/A 12/19/2020   Procedure: ESOPHAGOGASTRODUODENOSCOPY (EGD) WITH PROPOFOL;  Surgeon: Lesly Rubenstein, MD;  Location: [facility] ENDOSCOPY;  Service: Endoscopy;  Laterality: N/A;   EYE SURGERY     FRACTURE SURGERY     HIP SURGERY Left    pin and plate   IR ANGIO INTRA EXTRACRAN SEL COM CAROTID INNOMINATE BILAT MOD SED  10/17/2018   IR ANGIO VERTEBRAL SEL VERTEBRAL BILAT MOD SED  10/17/2018   IR ANGIOGRAM EXTREMITY LEFT  10/17/2018   JOINT REPLACEMENT     Left Total Knee Arthroplasty     Dr Marry Guan and also had 39 years ago from 2020    nasal polyps     removed x 2 affects sense of smell x 10-15 years from 04/28/20   prosthesis eye implant     REPLACEMENT UNICONDYLAR  JOINT KNEE Left    TONSILLECTOMY     TOTAL KNEE REVISION Left 09/13/2017   Procedure: TOTAL KNEE REVISION;  Surgeon: Dereck Leep, MD;  Location: [facility] ORS;  Service: Orthopedics;  Laterality: Left;    Allergies  Allergen Reactions   Amoxicillin Other (See Comments)    Pencillins   Cefzil [Cefprozil] Other (See Comments)    Unknown   Cephalexin Other (See Comments)    Unknown   Propoxyphene Other (See Comments)    Unknown   Relafen [Nabumetone] Other (See Comments)    Unknown   Talwin [Pentazocine] Other (See Comments)    Unknown   Penicillins Other (See Comments)    Cannot  remember reaction    Outpatient Encounter Medications as of 02/07/2022  Medication Sig   carbamide peroxide (DEBROX) 6.5 % OTIC solution Place 5 drops into the left ear 2 (two) times daily for 4 days.   acetaminophen (TYLENOL) 500 MG tablet Take 1,000 mg by mouth every 8 (eight) hours as needed (PAIN).   albuterol (VENTOLIN HFA) 108 (90 Base) MCG/ACT inhaler Inhale 2 puffs into the lungs every 6 (six) hours as needed for wheezing.   aspirin EC 81 MG tablet Take 1 tablet (81 mg total) by mouth daily. Swallow whole.   budesonide-formoterol (SYMBICORT) 160-4.5 MCG/ACT inhaler Inhale 2 puffs into the lungs 2 (two) times daily.   calcium carbonate (TUMS - DOSED IN MG ELEMENTAL CALCIUM) 500 MG chewable tablet Chew 1 tablet by mouth 4 (four) times daily as needed for indigestion or heartburn.   Cholecalciferol (VITAMIN D3) 50 MCG (2000 UT) capsule Take 1 capsule (2,000 Units total) by mouth daily.   COMIRNATY syringe    denosumab (PROLIA) 60 MG/ML SOSY injection Inject into the skin.   famotidine (PEPCID) 40 MG tablet TAKE 1 TABLET BY MOUTH EVERY DAY   fluticasone (FLONASE) 50 MCG/ACT nasal spray Place into both nostrils daily.   latanoprost (XALATAN) 0.005 % ophthalmic solution    levothyroxine (SYNTHROID) 50 MCG tablet TAKE 1 TABLET BY MOUTH EVERY DAY BEFORE BREAKFAST   loratadine (CLARITIN) 10 MG tablet Take 1  tablet (10 mg total) by mouth daily as needed for allergies.   montelukast (SINGULAIR) 10 MG tablet Take 1 tablet (10 mg total) by mouth at bedtime.   PARoxetine (PAXIL) 10 MG tablet Take 2 tablets (20 mg total) by mouth daily.   Polyvinyl Alcohol-Povidone PF 1.4-0.6 % SOLN Place 2 drops into both eyes 2 (two) times daily as needed (for dry eyes).   ramelteon (ROZEREM) 8 MG tablet Take 0.5-1 tablets (4-8 mg total) by mouth at bedtime.   simvastatin (ZOCOR) 20 MG tablet TAKE 1 TABLET BY MOUTH EVERYDAY AT BEDTIME   traZODone (DESYREL) 50 MG tablet Take 0.5 tablets (25 mg total) by mouth at bedtime.   No facility-administered encounter medications on file as of 02/07/2022.    Review of Systems:  Review of Systems  All other systems reviewed and are negative.   Health Maintenance  Topic Date Due   Medicare Annual Wellness (AWV)  05/31/2022   DTaP/Tdap/Td (2 - Tdap) 04/17/2026   Pneumonia Vaccine 54+ Years old  Completed   INFLUENZA VACCINE  Completed   DEXA SCAN  Completed   COVID-19 Vaccine  Completed   Zoster Vaccines- Shingrix  Completed   HPV VACCINES  Aged Out    Physical Exam: There were no vitals filed for this visit. There is no height or weight on file to calculate BMI. Physical Exam HENT:     Ears:     Comments: Bilaterally tortuous auditory canal Right without cerumen or impaction. Left with cerumen impaction, no foreign body present.     Labs reviewed: Basic Metabolic Panel: Recent Labs    10/17/21 1413  NA 141  K 4.1  CL 104  CO2 29  GLUCOSE 83  BUN 13  CREATININE 0.82  CALCIUM 9.1  TSH 2.18   Liver Function Tests: Recent Labs    10/17/21 1413  AST 14  ALT 10  ALKPHOS 50  BILITOT 0.5  PROT 6.3  ALBUMIN 4.0   No results for input(s): "LIPASE", "AMYLASE" in the last 8760 hours. No results for input(s): "AMMONIA" in the last  8760 hours. CBC: Recent Labs    10/17/21 1413  WBC 4.5  HGB 13.0  HCT 38.2  MCV 92.6  PLT 180.0   Lipid  Panel: Recent Labs    10/17/21 1413  CHOL 152  HDL 61.40  LDLCALC 68  TRIG 110.0  CHOLHDL 2   Lab Results  Component Value Date   HGBA1C 5.3 12/12/2016    Procedures since last visit: No results found.  Assessment/Plan Impacted cerumen of left ear - Plan: carbamide peroxide (DEBROX) 6.5 % OTIC solution Flush ear with saline in clinic in 3 d.    Labs/tests ordered:  * No order type specified * Next appt:  Visit date not found

## 2022-02-09 ENCOUNTER — Ambulatory Visit: Payer: Medicare HMO | Admitting: Student

## 2022-02-09 DIAGNOSIS — H6122 Impacted cerumen, left ear: Secondary | ICD-10-CM | POA: Diagnosis not present

## 2022-02-09 NOTE — Progress Notes (Signed)
[facility] clinic  Provider: Carver Murakami  Code Status: Full Code Goals of Care:     11/07/2021   11:25 AM  Advanced Directives  Does Patient Have a Medical Advance Directive? Yes  Type of Paramedic of [place];Living will  Does patient want to make changes to medical advance directive? No - Patient declined  Copy of [place] in Chart? Yes - validated most recent copy scanned in chart (See row information)     No chief complaint on file.   HPI: Patient is a 86 y.o. female seen today for an acute visit for follow up of impacted ear. Patient states she has some dizziness with lavage, however, improved prior to leaving  Past Medical History:  Diagnosis Date   Abdominal pain    Abnormal CT of liver    Accident due to mechanical fall without injury 01/28/2022   Acute pain of left shoulder 09/28/2020   Anxiety    Anxiety 04/28/2020   Aortic stenosis    Aortic stenosis 03/04/2012   Overview:   Not confirmed by Dr Rockey Situ.    Aortic valve disorders    Arthritis    Asthma    Asthma 08/07/2012   Barrett's esophagus    [facility] last EGD 02/2018 Dr. Gustavo Lah    Barrett's esophagus 03/11/2017   Overview:  Dr Gustavo Lah   Carotid artery stenosis 12/30/2018   1-39% b/l carotids       Colon polyps    Colon polyps    COPD (chronic obstructive pulmonary disease) (HCC)    DDD (degenerative disc disease), cervical    DDD (degenerative disc disease), cervical 04/28/2020   DDD (degenerative disc disease), lumbar 01/22/2017   Depressive disorder    Diverticulosis    Erosive esophagitis    Erosive esophagitis 04/28/2020   Fundic gland polyps of stomach, benign    Gastric polyp    Gastric polyps    Gastritis    Generalized osteoarthritis of hand 05/03/2015   GERD (gastroesophageal reflux disease)    GOA (generalized osteoarthritis) 11/23/2015   Goiter    Hiatal hernia    small    Hiatal hernia    History of colonic polyps 12/08/2013    Hypercholesteremia    Hypothyroidism    Impaired fasting glucose    Impaired fasting glucose 03/04/2012   Overview:   Glucose 111 02/2012   Intrinsic asthma, unspecified    Joint pain of ankle and foot, right 05/17/2015   Left rib fracture    Mechanical complication of knee prosthesis, initial encounter ([place]) 08/25/2017   Obstructive sleep apnea (adult) (pediatric)    used cpap x 10 years per Dr. Mortimer Fries off as of 12/30/18    OSA (obstructive sleep apnea)    used cpap x 10 years per Dr. 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   Stress 04/28/2020   Syncope and collapse    TIA (transient ischemic attack)    TIA (transient ischemic attack) 12/11/2016   TIA (transient ischemic attack) 12/11/2016   UTI (urinary tract infection)    Vitamin D deficiency     Past Surgical History:  Procedure Laterality Date  BREAST EXCISIONAL BIOPSY Left    BREAST SURGERY     CATARACT EXTRACTION W/PHACO Right 11/07/2021   Procedure: CATARACT EXTRACTION PHACO AND INTRAOCULAR LENS PLACEMENT (IOC) RIGHT  8.87 00:50.8;  Surgeon: Birder Robson, MD;  Location: [facility];  Service: Ophthalmology;  Laterality: Right;   COLONOSCOPY     COLONOSCOPY WITH ESOPHAGOGASTRODUODENOSCOPY (EGD)     COLONOSCOPY WITH PROPOFOL N/A 12/14/2014   Procedure: COLONOSCOPY WITH PROPOFOL;  Surgeon: Lollie Sails, MD;  Location: [facility] ENDOSCOPY;  Service: Endoscopy;  Laterality: N/A;   COLONOSCOPY WITH PROPOFOL N/A 03/18/2018   Procedure: COLONOSCOPY WITH PROPOFOL;  Surgeon: Lollie Sails, MD;  Location: [facility] ENDOSCOPY;  Service: Endoscopy;  Laterality: N/A;   ENUCLEATION     ESOPHAGOGASTRODUODENOSCOPY N/A 08/30/2020    Procedure: ESOPHAGOGASTRODUODENOSCOPY (EGD);  Surgeon: Lesly Rubenstein, MD;  Location: [facility] ENDOSCOPY;  Service: Endoscopy;  Laterality: N/A;   ESOPHAGOGASTRODUODENOSCOPY (EGD) WITH PROPOFOL N/A 12/14/2014   Procedure: ESOPHAGOGASTRODUODENOSCOPY (EGD) WITH PROPOFOL;  Surgeon: Lollie Sails, MD;  Location: [facility] ENDOSCOPY;  Service: Endoscopy;  Laterality: N/A;   ESOPHAGOGASTRODUODENOSCOPY (EGD) WITH PROPOFOL N/A 10/18/2016   Procedure: ESOPHAGOGASTRODUODENOSCOPY (EGD) WITH PROPOFOL;  Surgeon: Lollie Sails, MD;  Location: [facility] ENDOSCOPY;  Service: Endoscopy;  Laterality: N/A;   ESOPHAGOGASTRODUODENOSCOPY (EGD) WITH PROPOFOL N/A 03/18/2018   Procedure: ESOPHAGOGASTRODUODENOSCOPY (EGD) WITH PROPOFOL;  Surgeon: Lollie Sails, MD;  Location: [facility] ENDOSCOPY;  Service: Endoscopy;  Laterality: N/A;   ESOPHAGOGASTRODUODENOSCOPY (EGD) WITH PROPOFOL N/A 12/19/2020   Procedure: ESOPHAGOGASTRODUODENOSCOPY (EGD) WITH PROPOFOL;  Surgeon: Lesly Rubenstein, MD;  Location: [facility] ENDOSCOPY;  Service: Endoscopy;  Laterality: N/A;   EYE SURGERY     FRACTURE SURGERY     HIP SURGERY Left    pin and plate   IR ANGIO INTRA EXTRACRAN SEL COM CAROTID INNOMINATE BILAT MOD SED  10/17/2018   IR ANGIO VERTEBRAL SEL VERTEBRAL BILAT MOD SED  10/17/2018   IR ANGIOGRAM EXTREMITY LEFT  10/17/2018   JOINT REPLACEMENT     Left Total Knee Arthroplasty     Dr Marry Guan and also had 39 years ago from 2020    nasal polyps     removed x 2 affects sense of smell x 10-15 years from 04/28/20   prosthesis eye implant     REPLACEMENT UNICONDYLAR JOINT KNEE Left    TONSILLECTOMY     TOTAL KNEE REVISION Left 09/13/2017   Procedure: TOTAL KNEE REVISION;  Surgeon: Dereck Leep, MD;  Location: [facility] ORS;  Service: Orthopedics;  Laterality: Left;    Allergies  Allergen Reactions   Amoxicillin Other (See Comments)    Pencillins   Cefzil [Cefprozil] Other (See Comments)    Unknown   Cephalexin Other (See Comments)     Unknown   Propoxyphene Other (See Comments)    Unknown   Relafen [Nabumetone] Other (See Comments)    Unknown   Talwin [Pentazocine] Other (See Comments)    Unknown   Penicillins Other (See Comments)    Cannot remember reaction    Outpatient Encounter Medications as of 02/09/2022  Medication Sig   acetaminophen (TYLENOL) 500 MG tablet Take 1,000 mg by mouth every 8 (eight) hours as needed (PAIN).   albuterol (VENTOLIN HFA) 108 (90 Base) MCG/ACT inhaler Inhale 2 puffs into the lungs every 6 (six) hours as needed for wheezing.   aspirin EC 81 MG tablet Take 1 tablet (81 mg total) by mouth daily. Swallow whole.   budesonide-formoterol (SYMBICORT) 160-4.5 MCG/ACT inhaler Inhale 2 puffs into the lungs 2 (  two) times daily.   calcium carbonate (TUMS - DOSED IN MG ELEMENTAL CALCIUM) 500 MG chewable tablet Chew 1 tablet by mouth 4 (four) times daily as needed for indigestion or heartburn.   carbamide peroxide (DEBROX) 6.5 % OTIC solution Place 5 drops into the left ear 2 (two) times daily for 4 days.   Cholecalciferol (VITAMIN D3) 50 MCG (2000 UT) capsule Take 1 capsule (2,000 Units total) by mouth daily.   COMIRNATY syringe    denosumab (PROLIA) 60 MG/ML SOSY injection Inject into the skin.   famotidine (PEPCID) 40 MG tablet TAKE 1 TABLET BY MOUTH EVERY DAY   fluticasone (FLONASE) 50 MCG/ACT nasal spray Place into both nostrils daily.   latanoprost (XALATAN) 0.005 % ophthalmic solution    levothyroxine (SYNTHROID) 50 MCG tablet TAKE 1 TABLET BY MOUTH EVERY DAY BEFORE BREAKFAST   loratadine (CLARITIN) 10 MG tablet Take 1 tablet (10 mg total) by mouth daily as needed for allergies.   montelukast (SINGULAIR) 10 MG tablet Take 1 tablet (10 mg total) by mouth at bedtime.   PARoxetine (PAXIL) 10 MG tablet Take 2 tablets (20 mg total) by mouth daily.   Polyvinyl Alcohol-Povidone PF 1.4-0.6 % SOLN Place 2 drops into both eyes 2 (two) times daily as needed (for dry eyes).   ramelteon (ROZEREM) 8 MG  tablet Take 0.5-1 tablets (4-8 mg total) by mouth at bedtime.   simvastatin (ZOCOR) 20 MG tablet TAKE 1 TABLET BY MOUTH EVERYDAY AT BEDTIME   traZODone (DESYREL) 50 MG tablet Take 0.5 tablets (25 mg total) by mouth at bedtime.   No facility-administered encounter medications on file as of 02/09/2022.    Review of Systems:  Review of Systems  All other systems reviewed and are negative.   Health Maintenance  Topic Date Due   Medicare Annual Wellness (AWV)  05/31/2022   DTaP/Tdap/Td (2 - Tdap) 04/17/2026   Pneumonia Vaccine 38+ Years old  Completed   INFLUENZA VACCINE  Completed   DEXA SCAN  Completed   COVID-19 Vaccine  Completed   Zoster Vaccines- Shingrix  Completed   HPV VACCINES  Aged Out    Physical Exam: There were no vitals filed for this visit. There is no height or weight on file to calculate BMI. Physical Exam Constitutional:      Appearance: Normal appearance.  HENT:     Right Ear: Tympanic membrane normal.     Left Ear: Tympanic membrane normal.     Ears:     Comments: Left ear with some remaining cerumen, however, visible TM.  Neurological:     Mental Status: She is alert.     Labs reviewed: Basic Metabolic Panel: Recent Labs    10/17/21 1413  NA 141  K 4.1  CL 104  CO2 29  GLUCOSE 83  BUN 13  CREATININE 0.82  CALCIUM 9.1  TSH 2.18   Liver Function Tests: Recent Labs    10/17/21 1413  AST 14  ALT 10  ALKPHOS 50  BILITOT 0.5  PROT 6.3  ALBUMIN 4.0   No results for input(s): "LIPASE", "AMYLASE" in the last 8760 hours. No results for input(s): "AMMONIA" in the last 8760 hours. CBC: Recent Labs    10/17/21 1413  WBC 4.5  HGB 13.0  HCT 38.2  MCV 92.6  PLT 180.0   Lipid Panel: Recent Labs    10/17/21 1413  CHOL 152  HDL 61.40  LDLCALC 68  TRIG 110.0  CHOLHDL 2   Lab Results  Component Value  Date   HGBA1C 5.3 12/12/2016    Procedures since last visit: No results found.  Assessment/Plan 1. Impacted cerumen of left  ear Successful ear lavage. Discussed continuing drops for 5 more days, then discontinue. F/u PRN.    Labs/tests ordered:  * No order type specified * Next appt:  Visit date not found

## 2022-02-19 NOTE — Progress Notes (Signed)
  This encounter was created in error - please disregard. No show 

## 2022-02-21 ENCOUNTER — Encounter: Payer: Self-pay | Admitting: Family Medicine

## 2022-02-26 ENCOUNTER — Encounter: Payer: Self-pay | Admitting: Family Medicine

## 2022-02-26 DIAGNOSIS — R42 Dizziness and giddiness: Secondary | ICD-10-CM

## 2022-03-01 ENCOUNTER — Encounter: Payer: Medicare HMO | Admitting: Nurse Practitioner

## 2022-03-01 ENCOUNTER — Encounter: Payer: Self-pay | Admitting: Nurse Practitioner

## 2022-03-01 NOTE — Progress Notes (Signed)
This encounter was created in error - please disregard.

## 2022-03-03 ENCOUNTER — Other Ambulatory Visit: Payer: Self-pay | Admitting: Family Medicine

## 2022-03-03 DIAGNOSIS — F339 Major depressive disorder, recurrent, unspecified: Secondary | ICD-10-CM

## 2022-03-05 ENCOUNTER — Telehealth: Payer: Medicare HMO | Admitting: Family Medicine

## 2022-03-05 DIAGNOSIS — E559 Vitamin D deficiency, unspecified: Secondary | ICD-10-CM

## 2022-03-05 NOTE — Telephone Encounter (Signed)
Patient states she is taking 10 mg of Paxil every other day.

## 2022-03-05 NOTE — Therapy (Signed)
OUTPATIENT PHYSICAL THERAPY VESTIBULAR EVALUATION     Patient Name: Dorothy Church MRN: [id number] DOB:Dec 01, 1935, 87 y.o., female Today's Date: 03/07/2022  PCP: Carollee Leitz, MD REFERRING PROVIDER: Carollee Leitz, MD   PT End of Session - 03/06/22 [phone]     Visit Number 1    Number of Visits 9    Date for PT Re-Evaluation 05/01/22    PT Start Time 0958    PT Stop Time 1100    PT Time Calculation (min) 62 min    Equipment Utilized During Treatment --    Activity Tolerance Patient tolerated treatment well    Behavior During Therapy [facility] for tasks assessed/performed            Past Medical History:  Diagnosis Date   Abdominal pain    Abnormal CT of liver    Accident due to mechanical fall without injury 01/28/2022   Acute pain of left shoulder 09/28/2020   Anxiety    Anxiety 04/28/2020   Aortic stenosis    Aortic stenosis 03/04/2012   Overview:   Not confirmed by Dr Rockey Situ.    Aortic valve disorders    Arthritis    Asthma    Asthma 08/07/2012   Barrett's esophagus    [facility] last EGD 02/2018 Dr. Gustavo Lah    Barrett's esophagus 03/11/2017   Overview:  Dr Gustavo Lah   Carotid artery stenosis 12/30/2018   1-39% b/l carotids       Colon polyps    Colon polyps    COPD (chronic obstructive pulmonary disease) (HCC)    DDD (degenerative disc disease), cervical    DDD (degenerative disc disease), cervical 04/28/2020   DDD (degenerative disc disease), lumbar 01/22/2017   Depressive disorder    Diverticulosis    Erosive esophagitis    Erosive esophagitis 04/28/2020   Fundic gland polyps of stomach, benign    Gastric polyp    Gastric polyps    Gastritis    Generalized osteoarthritis of hand 05/03/2015   GERD (gastroesophageal reflux disease)    GOA (generalized osteoarthritis) 11/23/2015   Goiter    Hiatal hernia    small    Hiatal hernia    History of colonic polyps 12/08/2013   Hypercholesteremia    Hypothyroidism    Impaired fasting glucose    Impaired  fasting glucose 03/04/2012   Overview:   Glucose 111 02/2012   Intrinsic asthma, unspecified    Joint pain of ankle and foot, right 05/17/2015   Left rib fracture    Mechanical complication of knee prosthesis, initial encounter ([place]) 08/25/2017   Obstructive sleep apnea (adult) (pediatric)    used cpap x 10 years per Dr. Mortimer Fries off as of 12/30/18    OSA (obstructive sleep apnea)    used cpap x 10 years per Dr. 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   Stress 04/28/2020   Syncope and collapse    TIA (transient ischemic attack)    TIA (transient ischemic attack) 12/11/2016   TIA (transient ischemic attack) 12/11/2016   UTI (urinary tract infection)    Vitamin D deficiency    Past Surgical History:  Procedure Laterality Date   BREAST  EXCISIONAL BIOPSY Left    BREAST SURGERY     CATARACT EXTRACTION W/PHACO Right 11/07/2021   Procedure: CATARACT EXTRACTION PHACO AND INTRAOCULAR LENS PLACEMENT (IOC) RIGHT  8.87 00:50.8;  Surgeon: Birder Robson, MD;  Location: [facility];  Service: Ophthalmology;  Laterality: Right;   COLONOSCOPY     COLONOSCOPY WITH ESOPHAGOGASTRODUODENOSCOPY (EGD)     COLONOSCOPY WITH PROPOFOL N/A 12/14/2014   Procedure: COLONOSCOPY WITH PROPOFOL;  Surgeon: Lollie Sails, MD;  Location: [facility] ENDOSCOPY;  Service: Endoscopy;  Laterality: N/A;   COLONOSCOPY WITH PROPOFOL N/A 03/18/2018   Procedure: COLONOSCOPY WITH PROPOFOL;  Surgeon: Lollie Sails, MD;  Location: [facility] ENDOSCOPY;  Service: Endoscopy;  Laterality: N/A;   ENUCLEATION     ESOPHAGOGASTRODUODENOSCOPY N/A 08/30/2020   Procedure: ESOPHAGOGASTRODUODENOSCOPY (EGD);  Surgeon: Lesly Rubenstein, MD;  Location:  [facility] ENDOSCOPY;  Service: Endoscopy;  Laterality: N/A;   ESOPHAGOGASTRODUODENOSCOPY (EGD) WITH PROPOFOL N/A 12/14/2014   Procedure: ESOPHAGOGASTRODUODENOSCOPY (EGD) WITH PROPOFOL;  Surgeon: Lollie Sails, MD;  Location: [facility] ENDOSCOPY;  Service: Endoscopy;  Laterality: N/A;   ESOPHAGOGASTRODUODENOSCOPY (EGD) WITH PROPOFOL N/A 10/18/2016   Procedure: ESOPHAGOGASTRODUODENOSCOPY (EGD) WITH PROPOFOL;  Surgeon: Lollie Sails, MD;  Location: [facility] ENDOSCOPY;  Service: Endoscopy;  Laterality: N/A;   ESOPHAGOGASTRODUODENOSCOPY (EGD) WITH PROPOFOL N/A 03/18/2018   Procedure: ESOPHAGOGASTRODUODENOSCOPY (EGD) WITH PROPOFOL;  Surgeon: Lollie Sails, MD;  Location: [facility] ENDOSCOPY;  Service: Endoscopy;  Laterality: N/A;   ESOPHAGOGASTRODUODENOSCOPY (EGD) WITH PROPOFOL N/A 12/19/2020   Procedure: ESOPHAGOGASTRODUODENOSCOPY (EGD) WITH PROPOFOL;  Surgeon: Lesly Rubenstein, MD;  Location: [facility] ENDOSCOPY;  Service: Endoscopy;  Laterality: N/A;   EYE SURGERY     FRACTURE SURGERY     HIP SURGERY Left    pin and plate   IR ANGIO INTRA EXTRACRAN SEL COM CAROTID INNOMINATE BILAT MOD SED  10/17/2018   IR ANGIO VERTEBRAL SEL VERTEBRAL BILAT MOD SED  10/17/2018   IR ANGIOGRAM EXTREMITY LEFT  10/17/2018   JOINT REPLACEMENT     Left Total Knee Arthroplasty     Dr Marry Guan and also had 39 years ago from 2020    nasal polyps     removed x 2 affects sense of smell x 10-15 years from 04/28/20   prosthesis eye implant     REPLACEMENT UNICONDYLAR JOINT KNEE Left    TONSILLECTOMY     TOTAL KNEE REVISION Left 09/13/2017   Procedure: TOTAL KNEE REVISION;  Surgeon: Dereck Leep, MD;  Location: [facility] ORS;  Service: Orthopedics;  Laterality: Left;   Patient Active Problem List   Diagnosis Date Noted   Allergic rhinitis 03/10/2021   Gastroesophageal reflux disease 09/28/2020   Gastric polyp 04/28/2020   Lower esophageal ring (Schatzki) 04/28/2020   Insomnia 12/30/2018   Hiatal hernia    Depression, recurrent  ([place]) 03/11/2017   Asthma, chronic, severe persistent, uncomplicated 28/31/5176   OA (osteoarthritis) 04/04/2015   Osteoporosis 10/07/2012   Vitamin D deficiency    Hypothyroidism    Hyperlipidemia 08/07/2012   ONSET DATE: Feb 06, 2022  REFERRING DIAG: R42 Dizziness spells  Rationale for Evaluation and Treatment Rehabilitation  THERAPY DIAG:  Dizziness and giddiness  Difficulty in walking, not elsewhere classified  Unsteadiness on feet  Rationale for Evaluation and Treatment Rehabilitation  SUBJECTIVE:   SUBJECTIVE STATEMENT: Pt states she has had BPPV several times before. Pt states she started getting dizziness after being seen at the dentist in December.   Pt accompanied by: self  PERTINENT HISTORY: Patient states that she has had issues  several times before with vertigo and has had BPPV. Pt states "it usually starts with a dental appointment where they put me too far back and gets the crystals out of whack". Patient states that this most recent episode started after her dentist appt on Dec 19th.  Pt states this time it seems to have gotten a little less over time. Pt states when has previously had BPPV, she was able to get into my audiologist, who would perform treatment maneuvers. This time she could not get in to see the audiologist because she had not seen ENT in over 2 years. Patient has an ENT appointment on March 15, 2022. Dr. Volanda Napoleon, her primary MD, sent in the prescription for vestibular therapy. Pt reports that she has needed to use a SPC since onset of most recent dizziness episodes. Pt gets dizziness when rolling over in bed and reports quick movements and head and body turns bring on her symptoms. Pt states she has "difficulty not functioning right/ feels spacy and off". Pt had a mechanical fall tripping over a half step in the garage and she struck her head on the cement floor. Pt states she was seen in the ED and had a CT scan. Patient reports that she had a  witnessed fall without loss of consciousness on 01/05/2022 at her daughter's house in [place] when she tripped over a concrete half step in the garage and she fell onto concrete and struck her head. Pt was taken to the ED for care and pt reports head imaging was performed.   SYMPTOM BEHAVIOR: Non-Vestibular symptoms:  none   Type of dizziness: Imbalance (Disequilibrium), Spinning/Vertigo, and Unsteady with head/body turns  Duration: minutes  Aggravating factors: Induced by position change: rolling over, looking up or down quickly or turn head quickly, Induced by motion: looking up at the ceiling, turning body quickly, and turning head quickly, and   Relieving factors: head stationary Progression of symptoms: better History of similar episodes: yes  Auditory complaints (tinnitus, pain, drainage): pt states she has bilateral hearing aides.  Vision (last eye exam, diplopia, recent changes): Pt has an artificial left eye; pt states she had cataract surgery in right eye.    DIAGNOSTIC FINDINGS:  CT Head 04/07/2020: IMPRESSION: 1. Small right frontal scalp hematoma. No acute intracranial abnormality seen. 2. Mild multilevel degenerative disc disease. No acute abnormality seen in the cervical spine.  PAIN:  Are you having pain? No  PRECAUTIONS: Fall  FALLS: Has patient fallen in last 6 months? Yes. Number of falls 1 visiting daughter and she tripped over the half step in the garage  LIVING ENVIRONMENT: Lives with: lives alone Lives in: Other [place]: Yes: External: 1 step at front door and second floor  steps;   Has following equipment at home: Single point cane  PLOF: Independent and Independent with community mobility without device  PATIENT GOALS:Pt would like to have decreased dizziness and to strengthen her legs.   OBJECTIVE/ VESTIBULAR ASSESSMENT:  COGNITION: Overall cognitive status: Within functional limits for tasks  assessed   SENSATION: deferred  COORDINATION: deferred   POSTURE: rounded shoulders  Cervical ROM:   WFL in all planes. Decreased left and right rotation AROM noted but WFL. Pt reports discomfort in cervical spine area with AROM.   MMT: TBA next session  MMT Right 03/07/2022 Left 03/07/2022  Hip flexion    Hip abduction    Hip adduction    Hip internal rotation    Hip external rotation  Knee flexion    Knee extension    Ankle dorsiflexion    (Blank rows = not tested)  TRANSFERS: Sit to stand: CGA Stand to sit: CGA Chair to chair: CGA  GAIT: Pt arrived in [place] chair and transferred chair to mat table with CGA with decreased step length and stooped standing posture. Gait TBA next session.   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; right eye alignment normal  Ocular ROM:  right ocular ROM normal  Spontaneous Nystagmus: absent  Gaze-Induced Nystagmus: absent  Smooth Pursuits: intact  Saccades: hypometric/undershoots  VESTIBULAR - OCULAR REFLEX:   Slow VOR: Comment: deferred  VOR Cancellation: Normal  Head-Impulse Test: HIT Right: deferred HIT Left: deferred  Dynamic Visual Acuity:  deferred  POSITIONAL TESTING:    BPPV TESTS:  Symptoms Duration Intensity Nystagmus  Left Dix-Hallpike None N/A N/A None observed  Right Dix-Hallpike "Spacy feeling"; dizziness; denies vertigo Less than 1 minute 5-6/10 None observed   FUNCTIONAL OUTCOME MEASURES:  Results  Comments  DHI     48/100 Moderate perception of handicap; in need of intervention  ABC Scale      75% Falls risk; in need of intervention  DGI      deferred   FOTO      39 scale (39-70) with higher numbers indicating greater function Given the patient's risk adjustment variables, like-patients nationally had a FS score of 53/100 at intake  Dizziness Functional Status (DFS) 60.6 Scale 27-67; in need of intervention   VESTIBULAR TREATMENT:  Canalith  Repositioning:   Epley Right: Number of Reps: 2, Response to Treatment: comment: reports 5-6/10 first trial, and Comment: reports 6-7/10 dizziness second trial  Pt denies vertigo but reports dizziness and "spacy feeling". Pt required rest break between repetitions. Discussed post maneuver expectations with patient.  PATIENT EDUCATION: Education details: Discussed goals and plan of care Person educated: Patient Education method: Explanation and Verbal cues Education comprehension: verbalized understanding  GOALS: Goals reviewed with patient? Yes  SHORT TERM GOALS: Target date: 04/04/2022  Pt will be independent with HEP in order to improve balance and decrease dizziness symptoms in order to decrease fall risk and improve function at home and work. Baseline: patient not currently doing any exercises;  Goal status: INITIAL  LONG TERM GOALS: Target date: 05/02/2022  Patient will have improved FOTO score of 6 points or greater in order to demonstrate improvements in patient's ADLs and functional performance.  Baseline: scored 39 on 03/06/2022;  Goal status: INITIAL  2.  Patient will demonstrate reduced falls risk as evidenced by Dynamic Gait Index (DGI) >19/24. Baseline: deferred on 03/06/2022;  Goal status: INITIAL  3.  Patient will have demonstrate decreased falls risk as indicated by Activities Specific Balance Confidence Scale score of 80% or greater. Baseline: scored 75% on 03/06/2022;  Goal status: INITIAL  4.  Patient will reduce perceived disability to low levels as indicated by <40 on Dizziness Handicap Inventory. Baseline: scored 48/100 on 03/06/2022;  Goal status: INITIAL  5.  Patient will report 50% or greater improvement in their symptoms of dizziness and imbalance with provoking motions or positions  Baseline: reports episodes of vertigo and dizziness on 03/06/2022;  Goal status: INITIAL  ASSESSMENT:  CLINICAL IMPRESSION: Patient is a pleasant 87 y.o. female who was seen  today for physical therapy evaluation and treatment for dizziness. Patient with potentially positive right Dix-Hallpike test and performed 2 canalith repositioning maneuvers this date. Will plan on  repeat testing and treatment if indicated next session. Patient scored 48/100 on the [place] indicating moderate perception of handicap. Pt scored 39 on FOTO indicating decreased function. Pt reports that she has been needing to use a SPC since most recent onset of dizziness in December.  Pt presents with deficits in strength, gait, balance, mobility and dizziness. Pt will benefit from skilled PT services to address deficits and goals as set on plan of care.   OBJECTIVE IMPAIRMENTS decreased balance, decreased mobility, difficulty walking, decreased strength, and dizziness.   ACTIVITY LIMITATIONS cleaning, driving, and community activity.   PERSONAL FACTORS Past/current experiences, Time since onset of injury/illness/exacerbation, and 3+ comorbidities: anxiety, depressive disorder, carotid artery stenosis, COPD, and TIA      REHAB POTENTIAL: Good  CLINICAL DECISION MAKING: Evolving/moderate complexity  EVALUATION COMPLEXITY: Moderate  PLAN: PT FREQUENCY: 1x/week  PT DURATION: 8 weeks  PLANNED INTERVENTIONS: Therapeutic exercises, Therapeutic activity, Neuromuscular re-education, Balance training, Gait training, Patient/Family education, Self Care, Stair training, Vestibular training, and Canalith repositioning  PLAN FOR NEXT SESSION: Assess gait, do DGI, retest Dix-Hallpike, test head impulse test   Lady Deutscher PT, DPT Lady Deutscher, PT 03/07/2022, 2:35 PM

## 2022-03-06 ENCOUNTER — Ambulatory Visit: Payer: Medicare HMO | Attending: Family Medicine | Admitting: Physical Therapy

## 2022-03-06 ENCOUNTER — Ambulatory Visit (INDEPENDENT_AMBULATORY_CARE_PROVIDER_SITE_OTHER): Payer: Medicare HMO | Admitting: Internal Medicine

## 2022-03-06 ENCOUNTER — Encounter: Payer: Self-pay | Admitting: Internal Medicine

## 2022-03-06 ENCOUNTER — Other Ambulatory Visit: Payer: Self-pay

## 2022-03-06 VITALS — BP 124/60 | HR 82 | Temp 97.9°F | Ht 66.0 in | Wt 141.0 lb

## 2022-03-06 DIAGNOSIS — J4520 Mild intermittent asthma, uncomplicated: Secondary | ICD-10-CM | POA: Diagnosis not present

## 2022-03-06 DIAGNOSIS — R262 Difficulty in walking, not elsewhere classified: Secondary | ICD-10-CM | POA: Diagnosis present

## 2022-03-06 DIAGNOSIS — R42 Dizziness and giddiness: Secondary | ICD-10-CM | POA: Insufficient documentation

## 2022-03-06 DIAGNOSIS — R2681 Unsteadiness on feet: Secondary | ICD-10-CM | POA: Insufficient documentation

## 2022-03-06 NOTE — Therapy (Deleted)
OUTPATIENT PHYSICAL THERAPY VESTIBULAR EVALUATION     Patient Name: Dorothy Church MRN: [id number] DOB:1935/10/25, 87 y.o., female Today's Date: 03/06/2022  END OF SESSION:   Past Medical History:  Diagnosis Date   Abdominal pain    Abnormal CT of liver    Accident due to mechanical fall without injury 01/28/2022   Acute pain of left shoulder 09/28/2020   Anxiety    Anxiety 04/28/2020   Aortic stenosis    Aortic stenosis 03/04/2012   Overview:   Not confirmed by Dr Rockey Situ.    Aortic valve disorders    Arthritis    Asthma    Asthma 08/07/2012   Barrett's esophagus    [facility] last EGD 02/2018 Dr. Gustavo Lah    Barrett's esophagus 03/11/2017   Overview:  Dr Gustavo Lah   Carotid artery stenosis 12/30/2018   1-39% b/l carotids       Colon polyps    Colon polyps    COPD (chronic obstructive pulmonary disease) (HCC)    DDD (degenerative disc disease), cervical    DDD (degenerative disc disease), cervical 04/28/2020   DDD (degenerative disc disease), lumbar 01/22/2017   Depressive disorder    Diverticulosis    Erosive esophagitis    Erosive esophagitis 04/28/2020   Fundic gland polyps of stomach, benign    Gastric polyp    Gastric polyps    Gastritis    Generalized osteoarthritis of hand 05/03/2015   GERD (gastroesophageal reflux disease)    GOA (generalized osteoarthritis) 11/23/2015   Goiter    Hiatal hernia    small    Hiatal hernia    History of colonic polyps 12/08/2013   Hypercholesteremia    Hypothyroidism    Impaired fasting glucose    Impaired fasting glucose 03/04/2012   Overview:   Glucose 111 02/2012   Intrinsic asthma, unspecified    Joint pain of ankle and foot, right 05/17/2015   Left rib fracture    Mechanical complication of knee prosthesis, initial encounter ([place]) 08/25/2017   Obstructive sleep apnea (adult) (pediatric)    used cpap x 10 years per Dr. Mortimer Fries off as of 12/30/18    OSA (obstructive sleep apnea)    used cpap x 10 years per Dr. 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   Stress 04/28/2020   Syncope and collapse    TIA (transient ischemic attack)    TIA (transient ischemic attack) 12/11/2016   TIA (transient ischemic attack) 12/11/2016   UTI (urinary tract infection)    Vitamin D deficiency    Past Surgical History:  Procedure Laterality Date   BREAST EXCISIONAL BIOPSY Left    BREAST SURGERY     CATARACT EXTRACTION W/PHACO Right 11/07/2021   Procedure: CATARACT EXTRACTION PHACO AND INTRAOCULAR LENS PLACEMENT (IOC) RIGHT  8.87 00:50.8;  Surgeon: Birder Robson, MD;  Location: [place];  Service: Ophthalmology;  Laterality: Right;   COLONOSCOPY     COLONOSCOPY WITH ESOPHAGOGASTRODUODENOSCOPY (EGD)     COLONOSCOPY WITH PROPOFOL N/A 12/14/2014   Procedure: COLONOSCOPY WITH PROPOFOL;  Surgeon: Lollie Sails, MD;  Location: [facility] ENDOSCOPY;  Service: Endoscopy;  Laterality: N/A;   COLONOSCOPY WITH PROPOFOL  N/A 03/18/2018   Procedure: COLONOSCOPY WITH PROPOFOL;  Surgeon: Lollie Sails, MD;  Location: [facility] ENDOSCOPY;  Service: Endoscopy;  Laterality: N/A;   ENUCLEATION     ESOPHAGOGASTRODUODENOSCOPY N/A 08/30/2020   Procedure: ESOPHAGOGASTRODUODENOSCOPY (EGD);  Surgeon: Lesly Rubenstein, MD;  Location: [facility] ENDOSCOPY;  Service: Endoscopy;  Laterality: N/A;   ESOPHAGOGASTRODUODENOSCOPY (EGD) WITH PROPOFOL N/A 12/14/2014   Procedure: ESOPHAGOGASTRODUODENOSCOPY (EGD) WITH PROPOFOL;  Surgeon: Lollie Sails, MD;  Location: [facility] ENDOSCOPY;  Service: Endoscopy;  Laterality: N/A;   ESOPHAGOGASTRODUODENOSCOPY (EGD) WITH PROPOFOL N/A 10/18/2016   Procedure: ESOPHAGOGASTRODUODENOSCOPY (EGD) WITH PROPOFOL;  Surgeon:  Lollie Sails, MD;  Location: [facility] ENDOSCOPY;  Service: Endoscopy;  Laterality: N/A;   ESOPHAGOGASTRODUODENOSCOPY (EGD) WITH PROPOFOL N/A 03/18/2018   Procedure: ESOPHAGOGASTRODUODENOSCOPY (EGD) WITH PROPOFOL;  Surgeon: Lollie Sails, MD;  Location: [facility] ENDOSCOPY;  Service: Endoscopy;  Laterality: N/A;   ESOPHAGOGASTRODUODENOSCOPY (EGD) WITH PROPOFOL N/A 12/19/2020   Procedure: ESOPHAGOGASTRODUODENOSCOPY (EGD) WITH PROPOFOL;  Surgeon: Lesly Rubenstein, MD;  Location: [facility] ENDOSCOPY;  Service: Endoscopy;  Laterality: N/A;   EYE SURGERY     FRACTURE SURGERY     HIP SURGERY Left    pin and plate   IR ANGIO INTRA EXTRACRAN SEL COM CAROTID INNOMINATE BILAT MOD SED  10/17/2018   IR ANGIO VERTEBRAL SEL VERTEBRAL BILAT MOD SED  10/17/2018   IR ANGIOGRAM EXTREMITY LEFT  10/17/2018   JOINT REPLACEMENT     Left Total Knee Arthroplasty     Dr Marry Guan and also had 39 years ago from 2020    nasal polyps     removed x 2 affects sense of smell x 10-15 years from 04/28/20   prosthesis eye implant     REPLACEMENT UNICONDYLAR JOINT KNEE Left    TONSILLECTOMY     TOTAL KNEE REVISION Left 09/13/2017   Procedure: TOTAL KNEE REVISION;  Surgeon: Dereck Leep, MD;  Location: [facility] ORS;  Service: Orthopedics;  Laterality: Left;   Patient Active Problem List   Diagnosis Date Noted   Allergic rhinitis 03/10/2021   Gastroesophageal reflux disease 09/28/2020   Gastric polyp 04/28/2020   Lower esophageal ring (Schatzki) 04/28/2020   Insomnia 12/30/2018   Hiatal hernia    Depression, recurrent ([place]) 03/11/2017   Asthma, chronic, severe persistent, uncomplicated 30/16/0109   OA (osteoarthritis) 04/04/2015   Osteoporosis 10/07/2012   Vitamin D deficiency    Hypothyroidism    Hyperlipidemia 08/07/2012    PCP: *** REFERRING PROVIDER: ***  REFERRING DIAG: ***  THERAPY DIAG:  No diagnosis found.  ONSET DATE: ***  Rationale for Evaluation and Treatment: {HABREHAB:27488}  SUBJECTIVE:    SUBJECTIVE STATEMENT: *** Pt accompanied by: {accompnied:27141}  PERTINENT HISTORY: ***  PAIN:  Are you having pain? {OPRCPAIN:27236}  PRECAUTIONS: {Therapy precautions:24002}  WEIGHT BEARING RESTRICTIONS: {Yes ***/No:24003}  FALLS: Has patient fallen in last 6 months? {fallsyesno:27318}  LIVING ENVIRONMENT: Lives with: {OPRC lives with:25569::"lives with their family"} Lives in: {Lives in:25570} Stairs: {opstairs:27293} Has following equipment at home: {Assistive devices:23999}  PLOF: {PLOF:24004}  PATIENT GOALS: ***  OBJECTIVE:   DIAGNOSTIC FINDINGS: ***  COGNITION: Overall cognitive status: {cognition:24006}   SENSATION: {sensation:27233}  EDEMA:  {edema:24020}  MUSCLE TONE:  {LE tone:25568}  DTRs:  {DTR SITE:24025}  POSTURE:  {posture:25561}  Cervical ROM:    {AROM/PROM:27142} A/PROM (deg) eval  Flexion   Extension   Right lateral flexion   Left lateral flexion   Right rotation   Left rotation   (Blank rows = not tested)  STRENGTH: ***  LOWER  EXTREMITY MMT:   MMT Right eval Left eval  Hip flexion    Hip abduction    Hip adduction    Hip internal rotation    Hip external rotation    Knee flexion    Knee extension    Ankle dorsiflexion    Ankle plantarflexion    Ankle inversion    Ankle eversion    (Blank rows = not tested)  BED MOBILITY:  {Bed mobility:24027}  TRANSFERS: Assistive device utilized: {Assistive devices:23999}  Sit to stand: {Levels of assistance:24026} Stand to sit: {Levels of assistance:24026} Chair to chair: {Levels of assistance:24026} Floor: {Levels of assistance:24026}  RAMP: {Levels of assistance:24026}  CURB: {Levels of assistance:24026}  GAIT: Gait pattern: {gait characteristics:25376} Distance walked: *** Assistive device utilized: {Assistive devices:23999} Level of assistance: {Levels of assistance:24026} Comments: ***  FUNCTIONAL TESTS:  {Functional tests:24029}  PATIENT SURVEYS:   {rehab surveys:24030}  VESTIBULAR ASSESSMENT:  GENERAL OBSERVATION: ***   SYMPTOM BEHAVIOR:  Subjective history: ***  Non-Vestibular symptoms: {nonvestibular symptoms:25260}  Type of dizziness: {Type of Dizziness:25255}  Frequency: ***  Duration: ***  Aggravating factors: {Aggravating Factors:25258}  Relieving factors: {Relieving Factors:25259}  Progression of symptoms: {DESC; BETTER/WORSE:18575}  OCULOMOTOR EXAM:  Ocular Alignment: {Ocular Alignment:25262}  Ocular ROM: {RANGE OF MOTION:21649}  Spontaneous Nystagmus: {Spontaneous nystagmus:25263}  Gaze-Induced Nystagmus: {gaze-induced nystagmus:25264}  Smooth Pursuits: {smooth pursuit:25265}  Saccades: {saccades:25266}  Convergence/Divergence: *** cm   FRENZEL - FIXATION SUPRESSED:  Ocular Alignment: {Ocular Alignment:25262}  Spontaneous Nystagmus: {Spontaneous nystagmus:25263}  Gaze-Induced Nystagmus: {gaze-induced nystagmus:25264}  Horizontal head shaking - induced nystagmus: {head shaking induced nystagmus:25267}  Vertical head shaking - induced nystagmus: {head shaking induced nystagmus:25267}  Positional tests: {Positional tests:25271}  Pressure tests: {frenzel pressure tests:25268}  VESTIBULAR - OCULAR REFLEX:   Slow VOR: {slow VOR:25290}  VOR Cancellation: {vor cancellation:25291}  Head-Impulse Test: {head impulse test:25272}  Dynamic Visual Acuity: {dynamic visual acuity:25273}   POSITIONAL TESTING: {Positional tests:25271}  MOTION SENSITIVITY:  Motion Sensitivity Quotient Intensity: 0 = none, 1 = Lightheaded, 2 = Mild, 3 = Moderate, 4 = Severe, 5 = Vomiting  Intensity  1. Sitting to supine   2. Supine to L side   3. Supine to R side   4. Supine to sitting   5. L Hallpike-Dix   6. Up from L    7. R Hallpike-Dix   8. Up from R    9. Sitting, head tipped to L knee   10. Head up from L knee   11. Sitting, head tipped to R knee   12. Head up from R knee   13. Sitting head turns x5   14.Sitting head  nods x5   15. In stance, 180 turn to L    16. In stance, 180 turn to R     OTHOSTATICS: {Exam; orthostatics:31331}  FUNCTIONAL GAIT: {Functional tests:24029}   VESTIBULAR TREATMENT:                                                                                                   DATE: ***  Canalith Repositioning:  {Canalith Repositioning:25283} Gaze Adaptation:  {gaze adaptation:25286} Habituation:  {  habituation:25288} Other: ***  PATIENT EDUCATION: Education details: *** Person educated: {Person educated:25204} Education method: {Education Method:25205} Education comprehension: {Education Comprehension:25206}  HOME EXERCISE PROGRAM:  GOALS: Goals reviewed with patient? {yes/no:20286}  SHORT TERM GOALS: Target date: ***  *** Baseline: Goal status: {GOALSTATUS:25110}  2.  *** Baseline:  Goal status: {GOALSTATUS:25110}  3.  *** Baseline:  Goal status: {GOALSTATUS:25110}  4.  *** Baseline:  Goal status: {GOALSTATUS:25110}  5.  *** Baseline:  Goal status: {GOALSTATUS:25110}  6.  *** Baseline:  Goal status: {GOALSTATUS:25110}  LONG TERM GOALS: Target date: ***  *** Baseline:  Goal status: {GOALSTATUS:25110}  2.  *** Baseline:  Goal status: {GOALSTATUS:25110}  3.  *** Baseline:  Goal status: {GOALSTATUS:25110}  4.  *** Baseline:  Goal status: {GOALSTATUS:25110}  5.  *** Baseline:  Goal status: {GOALSTATUS:25110}  6.  *** Baseline:  Goal status: {GOALSTATUS:25110}  ASSESSMENT:  CLINICAL IMPRESSION: Patient is a *** y.o. *** who was seen today for physical therapy evaluation and treatment for ***.   OBJECTIVE IMPAIRMENTS: {opptimpairments:25111}.   ACTIVITY LIMITATIONS: {activitylimitations:27494}  PARTICIPATION LIMITATIONS: {participationrestrictions:25113}  PERSONAL FACTORS: {Personal factors:25162} are also affecting patient's functional outcome.   REHAB POTENTIAL: {rehabpotential:25112}  CLINICAL DECISION MAKING:  {clinical decision making:25114}  EVALUATION COMPLEXITY: {Evaluation complexity:25115}   PLAN:  PT FREQUENCY: {rehab frequency:25116}  PT DURATION: {rehab duration:25117}  PLANNED INTERVENTIONS: {rehab planned interventions:25118::"Therapeutic exercises","Therapeutic activity","Neuromuscular re-education","Balance training","Gait training","Patient/Family education","Self Care","Joint mobilization"}  PLAN FOR NEXT SESSION: ***   Lady Deutscher, PT 03/06/2022, 9:55 AM    Patient Name: Dorothy Church MRN: [id number] DOB:1935-12-22, 87 y.o., female Today's Date: 03/06/2022  PCP: Carollee Leitz, MD REFERRING PROVIDER: Carollee Leitz, MD    Past Medical History:  Diagnosis Date   Abdominal pain    Abnormal CT of liver    Accident due to mechanical fall without injury 01/28/2022   Acute pain of left shoulder 09/28/2020   Anxiety    Anxiety 04/28/2020   Aortic stenosis    Aortic stenosis 03/04/2012   Overview:   Not confirmed by Dr Rockey Situ.    Aortic valve disorders    Arthritis    Asthma    Asthma 08/07/2012   Barrett's esophagus    [facility] last EGD 02/2018 Dr. Gustavo Lah    Barrett's esophagus 03/11/2017   Overview:  Dr Gustavo Lah   Carotid artery stenosis 12/30/2018   1-39% b/l carotids       Colon polyps    Colon polyps    COPD (chronic obstructive pulmonary disease) (HCC)    DDD (degenerative disc disease), cervical    DDD (degenerative disc disease), cervical 04/28/2020   DDD (degenerative disc disease), lumbar 01/22/2017   Depressive disorder    Diverticulosis    Erosive esophagitis    Erosive esophagitis 04/28/2020   Fundic gland polyps of stomach, benign    Gastric polyp    Gastric polyps    Gastritis    Generalized osteoarthritis of hand 05/03/2015   GERD (gastroesophageal reflux disease)    GOA (generalized osteoarthritis) 11/23/2015   Goiter    Hiatal hernia    small    Hiatal hernia    History of colonic polyps 12/08/2013   Hypercholesteremia     Hypothyroidism    Impaired fasting glucose    Impaired fasting glucose 03/04/2012   Overview:   Glucose 111 02/2012   Intrinsic asthma, unspecified    Joint pain of ankle and foot, right 05/17/2015   Left rib fracture    Mechanical complication of knee prosthesis, initial encounter (  [place]) 08/25/2017   Obstructive sleep apnea (adult) (pediatric)    used cpap x 10 years per Dr. Mortimer Fries off as of 12/30/18    OSA (obstructive sleep apnea)    used cpap x 10 years per Dr. 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   Stress 04/28/2020   Syncope and collapse    TIA (transient ischemic attack)    TIA (transient ischemic attack) 12/11/2016   TIA (transient ischemic attack) 12/11/2016   UTI (urinary tract infection)    Vitamin D deficiency    Past Surgical History:  Procedure Laterality Date   BREAST EXCISIONAL BIOPSY Left    BREAST SURGERY     CATARACT EXTRACTION W/PHACO Right 11/07/2021   Procedure: CATARACT EXTRACTION PHACO AND INTRAOCULAR LENS PLACEMENT (IOC) RIGHT  8.87 00:50.8;  Surgeon: Birder Robson, MD;  Location: [place];  Service: Ophthalmology;  Laterality: Right;   COLONOSCOPY     COLONOSCOPY WITH ESOPHAGOGASTRODUODENOSCOPY (EGD)     COLONOSCOPY WITH PROPOFOL N/A 12/14/2014   Procedure: COLONOSCOPY WITH PROPOFOL;  Surgeon: Lollie Sails, MD;  Location: [facility] ENDOSCOPY;  Service: Endoscopy;  Laterality: N/A;   COLONOSCOPY WITH PROPOFOL N/A 03/18/2018   Procedure: COLONOSCOPY WITH PROPOFOL;  Surgeon: Lollie Sails, MD;  Location: [facility] ENDOSCOPY;  Service: Endoscopy;  Laterality: N/A;   ENUCLEATION     ESOPHAGOGASTRODUODENOSCOPY N/A 08/30/2020   Procedure:  ESOPHAGOGASTRODUODENOSCOPY (EGD);  Surgeon: Lesly Rubenstein, MD;  Location[facility] ENDOSCOPY;  Service: Endoscopy;  Laterality: N/A;   ESOPHAGOGASTRODUODENOSCOPY (EGD) WITH PROPOFOL N/A 12/14/2014   Procedure: ESOPHAGOGASTRODUODENOSCOPY (EGD) WITH PROPOFOL;  Surgeon: Lollie Sails, MD;  Location[facility] ENDOSCOPY;  Service: Endoscopy;  Laterality: N/A;   ESOPHAGOGASTRODUODENOSCOPY (EGD) WITH PROPOFOL N/A 10/18/2016   Procedure: ESOPHAGOGASTRODUODENOSCOPY (EGD) WITH PROPOFOL;  Surgeon: Lollie Sails, MD;  Location: [facility] ENDOSCOPY;  Service: Endoscopy;  Laterality: N/A;   ESOPHAGOGASTRODUODENOSCOPY (EGD) WITH PROPOFOL N/A 03/18/2018   Procedure: ESOPHAGOGASTRODUODENOSCOPY (EGD) WITH PROPOFOL;  Surgeon: Lollie Sails, MD;  Location: [facility] ENDOSCOPY;  Service: Endoscopy;  Laterality: N/A;   ESOPHAGOGASTRODUODENOSCOPY (EGD) WITH PROPOFOL N/A 12/19/2020   Procedure: ESOPHAGOGASTRODUODENOSCOPY (EGD) WITH PROPOFOL;  Surgeon: Lesly Rubenstein, MD;  Location: [facility] ENDOSCOPY;  Service: Endoscopy;  Laterality: N/A;   EYE SURGERY     FRACTURE SURGERY     HIP SURGERY Left    pin and plate   IR ANGIO INTRA EXTRACRAN SEL COM CAROTID INNOMINATE BILAT MOD SED  10/17/2018   IR ANGIO VERTEBRAL SEL VERTEBRAL BILAT MOD SED  10/17/2018   IR ANGIOGRAM EXTREMITY LEFT  10/17/2018   JOINT REPLACEMENT     Left Total Knee Arthroplasty     Dr Marry Guan and also had 39 years ago from 2020    nasal polyps     removed x 2 affects sense of smell x 10-15 years from 04/28/20   prosthesis eye implant     REPLACEMENT UNICONDYLAR JOINT KNEE Left    TONSILLECTOMY     TOTAL KNEE REVISION Left 09/13/2017   Procedure: TOTAL KNEE REVISION;  Surgeon: Dereck Leep, MD;  Location: [facility];  Service: Orthopedics;  Laterality: Left;   Patient Active Problem List   Diagnosis Date  Noted   Allergic rhinitis 03/10/2021   Gastroesophageal reflux disease 09/28/2020   Gastric polyp 04/28/2020   Lower esophageal ring  (Schatzki) 04/28/2020   Insomnia 12/30/2018   Hiatal hernia    Depression, recurrent ([place]) 03/11/2017   Asthma, chronic, severe persistent, uncomplicated 54/65/0354   OA (osteoarthritis) 04/04/2015   Osteoporosis 10/07/2012   Vitamin D deficiency    Hypothyroidism    Hyperlipidemia 08/07/2012    ONSET DATE: ***  REFERRING DIAG: ***  Rationale for Evaluation and Treatment {HABREHAB:27488}  THERAPY DIAG:  No diagnosis found.  Rationale for Evaluation and Treatment {HABREHAB:27488}  SUBJECTIVE:   SUBJECTIVE STATEMENT:  Pt accompanied by: {accompnied:27141}  PERTINENT HISTORY:  SYMPTOM BEHAVIOR: Non-Vestibular symptoms: {nonvestibular symptoms:25260} Type of dizziness: {Type of Dizziness:25255} Frequency:   Duration:    Aggravating factors: {Aggravating Factors:25258} Relieving factors: {Relieving Factors:25259} Progression of symptoms: {DESC; BETTER/WORSE:18575} History of similar episodes:  Description of dizziness: (vertigo, unsteadiness, lightheadedness, falling, general unsteadiness, whoozy, swimmy-headed sensation, aural fullness) Symptom nature: (motion provoked, positional, spontaneous, constant, variable, intermittent)  Auditory complaints (tinnitus, pain, drainage): Vision (last eye exam, diplopia, recent changes):  Current Symptoms: (dysarthria, dysphagia, drop attacks, bowel and bladder changes, recent weight loss/gain)    Review of systems negative for red flags.      DIAGNOSTIC FINDINGS:  PAIN:  Are you having pain? {OPRCPAIN:27236}  PRECAUTIONS: {Therapy precautions:24002}  FALLS: Has patient fallen in last 6 months? {fallsyesno:27318}  LIVING ENVIRONMENT: Lives with: {OPRC lives with:25569::"lives with their family"} Lives in: {Lives in:25570} Stairs: {opstairs:27293} Has following equipment at home: {Assistive devices:23999}  PLOF: {PLOF:24004}  PATIENT GOALS:  OBJECTIVE/ VESTIBULAR ASSESSMENT:  COGNITION: Overall cognitive status:  {cognition:24006}   SENSATION: {sensation:27233}  COORDINATION: Finger to Nose: Dysmetric  /  Normal Past Pointing:  Left  /  Right  /  Normal Pronator Drift:    DTRs:  {DTR SITE:24025}  POSTURE: {posture:25561}  Cervical ROM:   WFL and painless in all planes. No gross deficits identified  {AROM/PROM:27142} A/PROM (deg) 03/06/2022  Flexion   Extension   Right lateral flexion   Left lateral flexion   Right rotation   Left rotation   (Blank rows = not tested)  MMT:   MMT Right 03/06/2022 Left 03/06/2022  Hip flexion    Hip abduction    Hip adduction    Hip internal rotation    Hip external rotation    Knee flexion    Knee extension    Ankle dorsiflexion    Ankle plantarflexion    Ankle inversion    Ankle eversion    (Blank rows = not tested)  TRANSFERS: Sit to stand: {Levels of assistance:24026} Stand to sit: {Levels of assistance:24026} Chair to chair: {Levels of assistance:24026}  GAIT: Gait pattern: {gait characteristics:25376} Distance walked: *** Scanning of visual environment with gait is:  Assistive device utilized: {Assistive devices:23999} Level of assistance: {Levels of assistance:24026} Comments:   ORTHOSTATICS: {Exam; orthostatics:31331}  OCULOMOTOR EXAM:  Ocular Alignment: {Ocular Alignment:25262}  Ocular ROM: {RANGE OF MOTION:21649}  Spontaneous Nystagmus: {Spontaneous nystagmus:25263}  Gaze-Induced Nystagmus: {gaze-induced nystagmus:25264}  Smooth Pursuits: {smooth pursuit:25265}  Saccades: {saccades:25266}  Convergence/Divergence: *** cm   FRENZEL - FIXATION SUPRESSED:  Ocular Alignment: {Ocular Alignment:25262}  Spontaneous Nystagmus: {Spontaneous nystagmus:25263}  Gaze-Induced Nystagmus: {gaze-induced nystagmus:25264}  Horizontal head shaking - induced nystagmus: {head shaking induced nystagmus:25267}  Vertical head shaking - induced nystagmus: {head shaking induced nystagmus:25267}  Positional tests: {Positional  tests:25271}  Pressure tests: {frenzel pressure tests:25268}  VESTIBULAR - OCULAR REFLEX:   Slow VOR: {slow VOR:25290}  VOR Cancellation: {vor cancellation:25291}  Head-Impulse Test: {head impulse test:25272}  Dynamic Visual Acuity: {dynamic visual acuity:25273}  POSTURAL CONTROL TESTS:  Clinical Test of Sensory Interaction for Balance (CTSIB):  CONDITION TIME STRATEGY SWAY  Eyes open, firm surface 30 seconds ankle   Eyes closed, firm surface 30 seconds ankle   Eyes open, foam surface 30 seconds ankle   Eyes closed, foam surface 30 seconds ankle    POSITIONAL TESTING: {Positional tests:25271}   BPPV TESTS:  Symptoms Duration Intensity Nystagmus  Left Dix-Hallpike None   None observed  Right Dix-Hallpike None   None observed  Left Head Roll None   None observed  Right Head Roll None   None observed    OCULOMOTOR / VESTIBULAR TESTING: Oculomotor Exam- Room Light  Normal Abnormal Comments  Ocular Alignment N    Ocular ROM N    Spontaneous Nystagmus N    Gaze evoked Nystagmus N    Smooth Pursuit N    Saccades N    VOR N    VOR Cancellation N    Left Head Impulse N    Right Head Impulse N    Head Shaking Nystagmus     Static Acuity     Dynamic Acuity      Oculomotor Exam- Fixation Suppressed  Normal Abnormal Comments  Ocular Alignment     Ocular ROM     Spontaneous Nystagmus     Gaze evoked Nystagmus     Head Shaking Nystagmus      FUNCTIONAL GAIT: {Functional tests:24029}  PATIENT SURVEYS:  {rehab surveys:24030}  FUNCTIONAL OUTCOME MEASURES:  Results Comments  DHI       /100 Moderate perception of handicap; in need of intervention  ABC Scale      % Falls risk; in need of intervention  DGI      /24 Falls risk; in need of intervention  FOTO  scale (39-70) with higher numbers indicating greater function Given the patient's risk adjustment variables, like-patients nationally had a FS score of   /100 at intake  Dizziness Positional Status (DFS)  Scale 27-67;  in need of intervention  10 meter Walking Speed M/sec Average as compared to age and gender normative values    VESTIBULAR TREATMENT:  Canalith Repositioning:   {Canalith Repositioning:25283} Gaze Adaptation:   {gaze adaptation:25286} Habituation:   {habituation:25288}  PATIENT EDUCATION: Education details: *** Person educated: {Person educated:25204} Education method: {Education Method:25205} Education comprehension: {Education Comprehension:25206}  GOALS: Goals reviewed with patient? {yes/no:20286}  SHORT TERM GOALS: Target date: {follow up:25551}  Pt will be independent with HEP in order to improve balance and decrease dizziness symptoms in order to decrease fall risk and improve function at home and work. Baseline: *** Goal status: {GOALSTATUS:25110}  2.  *** Baseline: *** Goal status: {GOALSTATUS:25110}  3.  *** Baseline: *** Goal status: {GOALSTATUS:25110}  LONG TERM GOALS: Target date: {follow up:25551}  Patient will have improved FOTO score of 6 points or greater in order to demonstrate improvements in patient's ADLs and functional performance.  Baseline: *** Goal status: {GOALSTATUS:25110}  2.  Patient will demonstrate reduced falls risk as evidenced by Dynamic Gait Index (DGI) >19/24. Baseline: *** Goal status: {GOALSTATUS:25110}  3.  Patient will reduce falls risk as indicated by Activities Specific Balance Confidence Scale (ABC) >67%.      Patient will have demonstrate decreased falls risk as indicated by Activities Specific Balance Confidence Scale score of 80% or greater. Baseline: *** Goal status: {GOALSTATUS:25110}  4.  Patient will reduce perceived  disability to moderate levels as indicated by <70 on Dizziness Handicap Inventory [facility]).      Patient will reduce perceived disability to low levels as indicated by <40 on Dizziness Handicap Inventory. Baseline: *** Goal status: {GOALSTATUS:25110}  5.  *** Baseline: *** Goal status:  {GOALSTATUS:25110}  6.  *** Baseline: *** Goal status: {GOALSTATUS:25110}  Patient reports no vertigo with provoking motions or positions. Patient will report 50% or greater improvement in their symptoms of dizziness and imbalance with provoking motions or positions  Patient will improve self-selected gait speed to 1.0 m/s on 10 m walk to indicate reduced falls risk, improved community mobility and independence with ADL's. Patient will ambulate all barriers and surfaces with appropriate or no device at normal speeds to improve community mobility. Patient will improve community mobility and reduce falls risk as evidenced by self-selected 4mwalk speed >1.0 m/s.  ASSESSMENT:  CLINICAL IMPRESSION: Patient is a *** y.o. *** who was seen today for physical therapy evaluation and treatment for ***.  Pt is a pleasant year-old female/female referred for difficulty with balance. PT examination reveals deficits . Pt presents with deficits in strength, gait and balance. Pt will benefit from skilled PT services to address deficits in balance and decrease risk for future falls.   OBJECTIVE IMPAIRMENTS {opptimpairments:25111}.   ACTIVITY LIMITATIONS {activity limitations:25113}.   PERSONAL FACTORS {Personal factors:25162} are also affecting patient's functional outcome.   REHAB POTENTIAL: {rehabpotential:25112}  CLINICAL DECISION MAKING: {clinical decision making:25114}  EVALUATION COMPLEXITY: {Evaluation complexity:25115}   PLAN: PT FREQUENCY: {rehab frequency:25116}  PT DURATION: {rehab duration:25117}  PLANNED INTERVENTIONS: {rehab planned interventions:25118::"Therapeutic exercises","Therapeutic activity","Neuromuscular re-education","Balance training","Gait training","Patient/Family education","Self Care","Joint mobilization"}  PLAN FOR NEXT SESSION: ***   DLady Deutscher PT 03/06/2022, 9:55 AM    NEUROLOGICAL SCREEN: (2+ unless otherwise noted.) N=normal  Ab=abnormal  Level  Dermatome R L Myotome R L Reflex R L  C3 Anterior Neck N N Sidebend C2-3 N N Jaw CN V    C4 Top of Shoulder N N Shoulder Shrug C4 N N Hoffman's UMN    C5 Lateral Upper Arm N N Shoulder ABD C4-5 N N Biceps C5-6    C6 Lateral Arm/ Thumb N N Arm Flex/ Wrist Ext C5-6 N N Brachiorad. C5-6    C7 Middle Finger N N Arm Ext//Wrist Flex C6-7 N N Triceps C7    C8 4th & 5th Finger N N Flex/ Ext Carpi Ulnaris C8 N N Patellar (L3-4)    T1 Medial Arm N N Interossei T1 N N Gastrocnemius    L2 Medial thigh/groin N N Illiopsoas (L2-3) N N     L3 Lower thigh/med.knee N N Quadriceps (L3-4) N N     L4 Medial leg/lat thigh N N Tibialis Ant (L4-5) N N     L5 Lat. leg & dorsal foot N N EHL (L5) N N     S1 post/lat foot/thigh/leg N N Gastrocnemius (S1-2) N N     S2 Post./med. thigh & leg N N Hamstrings (L4-S3) N N       Cranial Nerves Visual acuity and visual fields are intact  Extraocular muscles are intact  Facial sensation is intact bilaterally  Facial strength is intact bilaterally  Hearing is normal as tested by gross conversation Palate elevates midline, normal phonation  Shoulder shrug strength is intact  Tongue protrudes midline   COORDINATION: Finger to Nose: Normal Heel to Shin: Normal Pronator Drift: Negative Rapid Alternating Movements: Normal Finger to Thumb Opposition: Normal  FUNCTIONAL OUTCOME MEASURES  Results Comments  BERG /56 Fall risk, in need of intervention  FGA /30   TUG seconds   5TSTS seconds   6 Minute Walk Test    10 Meter Gait Speed Self-selected: s = m/s; Fastest: s = m/s Below normative values for full community ambulation  If blank = not tested   Pt will improve BERG by at least 3 points in order to demonstrate clinically significant improvement in balance.    Pt will improve DGI by at least 3 points in order to demonstrate clinically significant improvement in balance and decreased risk for falls.  Pt will improve ABC by at least 13% in order to demonstrate  clinically significant improvement in balance confidence.   Pt will decrease 5TSTS by at least 3 seconds in order to demonstrate clinically significant improvement in LE strength.  Pt will decrease TUG to below 14 seconds/decrease in order to demonstrate decreased fall risk.  Pt will decrease DHI score by at least 18 points in order to demonstrate clinically significant reduction in disability     GOALS: GOALS: Goals reviewed with patient? {yes/no:20286}  SHORT TERM GOALS:  STG Name Target Date Goal status  1 Pt will be independent with HEP for dizziness and balance in order to decrease fall risk and improve function at home and work.  Baseline:  {follow up:25551} {GOALSTATUS:25110}  2 *** Baseline:  {follow up:25551} {GOALSTATUS:25110}  3 *** Baseline: {follow up:25551} {GOALSTATUS:25110}  4 *** Baseline: {follow up:25551} {GOALSTATUS:25110}  5 *** Baseline: {follow up:25551} {GOALSTATUS:25110}  6 *** Baseline: {follow up:25551} {GOALSTATUS:25110}  7 *** Baseline: {follow up:25551} {GOALSTATUS:25110}   LONG TERM GOALS:   LTG Name Target Date Goal status  1 *** Baseline: {follow up:25551} {GOALSTATUS:25110}  2 *** Baseline: {follow up:25551} {GOALSTATUS:25110}  3 *** Baseline: {follow up:25551} {GOALSTATUS:25110}  4 *** Baseline: {follow up:25551} {GOALSTATUS:25110}  5 *** Baseline: {follow up:25551} {GOALSTATUS:25110}  6 *** Baseline: {follow up:25551} {GOALSTATUS:25110}  7 *** Baseline: {follow up:25551} {GOALSTATUS:25110}   Goal status: {GOALSTATUS:25110}

## 2022-03-06 NOTE — Progress Notes (Signed)
[facility] Consultation      MRN# [id number] Dorothy Church [birth year]    Brief History:  87 year old female past medical history of asthma  Patient states she had asthma during childhood where she had frequent hospitalizations but no intubations. Patient states her breathing is trouble with certain triggers such as mold, dust, damp areas. She was told about 45 years ago she had ASTHMA and was also placed on Symbicort at that time along with as needed albuterol. She has a history of obstructive sleep apnea diagnosed 8 years ago and she's currently on CPAP; she doesn't recall her CPAP settings. Patient states she never smoked tobacco had any secondhand exposure.  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   CC: Follow-up asthma  HPI Well controlled at this time Once daily Symbicort  No exacerbation at this time No evidence of heart failure at this time No evidence or signs of infection at this time No respiratory distress No fevers, chills, nausea, vomiting, diarrhea No evidence of lower extremity edema No evidence hemoptysis   PATIENT HAS HAD NEGATIVE SLEEP STUDY 2 YEARS AGO PATIENT DOES NOT HAVE OSA      Medication:    Current Outpatient Medications:    acetaminophen (TYLENOL) 500 MG tablet, Take 1,000 mg by mouth every 8 (eight) hours as needed (PAIN)., Disp: , Rfl:    albuterol (VENTOLIN HFA) 108 (90 Base) MCG/ACT inhaler, Inhale 2 puffs into the lungs every 6 (six) hours as needed for wheezing., Disp: 18 g, Rfl: 5   aspirin EC 81 MG tablet, Take 1 tablet (81 mg total) by mouth daily. Swallow whole., Disp: , Rfl:    budesonide-formoterol (SYMBICORT) 160-4.5 MCG/ACT inhaler, Inhale 2 puffs into the lungs 2 (two) times daily., Disp: 10.2 each, Rfl: 11   calcium carbonate (TUMS - DOSED IN MG ELEMENTAL CALCIUM) 500 MG chewable tablet, Chew 1 tablet by mouth 4 (four) times  daily as needed for indigestion or heartburn., Disp: , Rfl:    Cholecalciferol (VITAMIN D3) 50 MCG (2000 UT) capsule, Take 1 capsule (2,000 Units total) by mouth daily., Disp: , Rfl:    COMIRNATY syringe, , Disp: , Rfl:    denosumab (PROLIA) 60 MG/ML SOSY injection, Inject into the skin., Disp: , Rfl:    famotidine (PEPCID) 40 MG tablet, TAKE 1 TABLET BY MOUTH EVERY DAY, Disp: 90 tablet, Rfl: 0   fluticasone (FLONASE) 50 MCG/ACT nasal spray, Place into both nostrils daily., Disp: , Rfl:    latanoprost (XALATAN) 0.005 % ophthalmic solution, 1 drop at bedtime., Disp: , Rfl:    levothyroxine (SYNTHROID) 50 MCG tablet, TAKE 1 TABLET BY MOUTH EVERY DAY BEFORE BREAKFAST, Disp: 90 tablet, Rfl: 1   PARoxetine (PAXIL) 10 MG tablet, Take 2 tablets (20 mg total) by mouth daily., Disp: , Rfl:    simvastatin (ZOCOR) 20 MG tablet, TAKE 1 TABLET BY MOUTH EVERYDAY AT BEDTIME, Disp: 90 tablet, Rfl: 3   simvastatin (ZOCOR) 40 MG tablet, Take 40 mg by mouth daily., Disp: , Rfl:    traZODone (DESYREL) 50 MG tablet, Take 0.5 tablets (25 mg total) by mouth at bedtime., Disp: 90 tablet, Rfl: 3   ramelteon (ROZEREM) 8 MG tablet, Take 0.5-1 tablets (4-8 mg total) by mouth at bedtime. (Patient not taking: Reported on 03/06/2022), Disp: 30 tablet, Rfl: 0       BP 124/60 (BP Location: Left Arm, Cuff Size: Normal)  Pulse 82   Temp 97.9 F (36.6 C) (Temporal)   Ht '5\' 6"'$  (1.676 m)   Wt 141 lb (64 kg)   SpO2 99%   BMI 22.76 kg/m    Allergies:  Amoxicillin, Cefzil [cefprozil], Cephalexin, Propoxyphene, Relafen [nabumetone], Talwin [pentazocine], and Penicillins      Review of Systems: Gen:  Denies  fever, sweats, chills weight loss  HEENT: Denies blurred vision, double vision, ear pain, eye pain, hearing loss, nose bleeds, sore throat Cardiac:  No dizziness, chest pain or heaviness, chest tightness,edema, No JVD Resp:   No cough, -sputum production, -shortness of breath,-wheezing, -hemoptysis,  Other:  All  other systems negative    Physical Examination:   General Appearance: No distress  EYES PERRLA, EOM intact.   NECK Supple, No JVD Pulmonary: normal breath sounds, No wheezing.  CardiovascularNormal S1,S2.  No m/r/g.   Abdomen: Benign, Soft, non-tender. ALL OTHER ROS ARE NEGATIVE         Assessment and Plan:  87 year old pleasant white female seen today for follow-up assessment of asthma which seems to be well controlled at this time with mild intermittent asthma   Asthma mild intermittent well-controlled Uses Symbicort twice daily No need for systemic steroids or antibiotics at this time    OSA patient does not have sleep apnea There is no evidence of sleep apnea based on sleep study 2 years ago  Patient has poor posture does a lot of quilting and sewing Education provided to sit up straight take deep breaths   MEDICATION ADJUSTMENTS/LABS AND TESTS ORDERED: Avoid secondhand smoke Avoid SICK contacts Recommend  Masking  when appropriate Recommend Keep up-to-date with vaccinations  Continue Symbicort 2 puffs daily   CURRENT MEDICATIONS REVIEWED AT LENGTH WITH PATIENT TODAY   Patient satisfied with Plan of action and management. All questions answered  Follow up in 1 year  Total time spent 22 minutes  Maretta Bees Patricia Pesa, M.D.  [facility]  Medical Director [place] Director [facility] Cardio-P[facility] Department

## 2022-03-06 NOTE — Patient Instructions (Addendum)
Avoid secondhand smoke Avoid SICK contacts Recommend  Masking  when appropriate Recommend Keep up-to-date with vaccinations  Continue Symbicort 2 puffs daily

## 2022-03-12 ENCOUNTER — Encounter: Payer: Self-pay | Admitting: Family Medicine

## 2022-03-12 ENCOUNTER — Telehealth (INDEPENDENT_AMBULATORY_CARE_PROVIDER_SITE_OTHER): Payer: Medicare HMO | Admitting: Family Medicine

## 2022-03-12 DIAGNOSIS — K219 Gastro-esophageal reflux disease without esophagitis: Secondary | ICD-10-CM | POA: Diagnosis not present

## 2022-03-12 DIAGNOSIS — H8110 Benign paroxysmal vertigo, unspecified ear: Secondary | ICD-10-CM | POA: Diagnosis not present

## 2022-03-12 DIAGNOSIS — R69 Illness, unspecified: Secondary | ICD-10-CM | POA: Diagnosis not present

## 2022-03-12 DIAGNOSIS — F339 Major depressive disorder, recurrent, unspecified: Secondary | ICD-10-CM | POA: Diagnosis not present

## 2022-03-12 MED ORDER — PANTOPRAZOLE SODIUM 40 MG PO TBEC: 40.0000 mg | DELAYED_RELEASE_TABLET | Freq: Every day | ORAL | 1 refills | Status: DC

## 2022-03-12 NOTE — Progress Notes (Signed)
Virtual Visit via Video note  I connected with Dorothy Church on 03/13/22 at 1158 by video and verified that I am speaking with the correct person using two identifiers. Dorothy Church is currently located at home and  is currently alone her during visit. The provider, Carollee Leitz, MD is located in their office at time of visit.  I discussed the limitations, risks, security and privacy concerns of performing an evaluation and management service by video and the availability of in person appointments. I also discussed with the patient that there may be a patient responsible charge related to this service. The patient expressed understanding and agreed to proceed.  Subjective: PCP: Carollee Leitz, MD  Chief Complaint  Patient presents with   Dizziness    Dizziness  Dizziness resolved with vestibular rehab.  Has 1 more session.  Also reports plans to work on balance with PT.  Mood disorder Currently taking Paxil 5 mg every other day.  Endorses sometimes feeling slightly anxious.  Would like to continue weaning medication.  Denies any SI/HI.  Appetite good.  Sleep remains same.  Has not tried Remeron and continues to use trazodone 25 mg nightly.  ROS: Per HPI  Current Outpatient Medications:    acetaminophen (TYLENOL) 500 MG tablet, Take 1,000 mg by mouth every 8 (eight) hours as needed (PAIN)., Disp: , Rfl:    albuterol (VENTOLIN HFA) 108 (90 Base) MCG/ACT inhaler, Inhale 2 puffs into the lungs every 6 (six) hours as needed for wheezing., Disp: 18 g, Rfl: 5   aspirin EC 81 MG tablet, Take 1 tablet (81 mg total) by mouth daily. Swallow whole., Disp: , Rfl:    budesonide-formoterol (SYMBICORT) 160-4.5 MCG/ACT inhaler, Inhale 2 puffs into the lungs 2 (two) times daily., Disp: 10.2 each, Rfl: 11   calcium carbonate (TUMS - DOSED IN MG ELEMENTAL CALCIUM) 500 MG chewable tablet, Chew 1 tablet by mouth 4 (four) times daily as needed for indigestion or heartburn., Disp: , Rfl:     Cholecalciferol (VITAMIN D3) 50 MCG (2000 UT) capsule, Take 1 capsule (2,000 Units total) by mouth daily., Disp: , Rfl:    denosumab (PROLIA) 60 MG/ML SOSY injection, Inject into the skin., Disp: , Rfl:    famotidine (PEPCID) 40 MG tablet, TAKE 1 TABLET BY MOUTH EVERY DAY, Disp: 90 tablet, Rfl: 0   fluticasone (FLONASE) 50 MCG/ACT nasal spray, Place into both nostrils daily., Disp: , Rfl:    latanoprost (XALATAN) 0.005 % ophthalmic solution, 1 drop at bedtime., Disp: , Rfl:    levothyroxine (SYNTHROID) 50 MCG tablet, TAKE 1 TABLET BY MOUTH EVERY DAY BEFORE BREAKFAST, Disp: 90 tablet, Rfl: 1   pantoprazole (PROTONIX) 40 MG tablet, Take 1 tablet (40 mg total) by mouth daily., Disp: 30 tablet, Rfl: 1   PARoxetine (PAXIL) 10 MG tablet, Take 2 tablets (20 mg total) by mouth daily., Disp: , Rfl:    PARoxetine (PAXIL) 10 MG tablet, Take 1 tablet (10 mg total) by mouth daily., Disp: 90 tablet, Rfl: 0   ramelteon (ROZEREM) 8 MG tablet, Take 0.5-1 tablets (4-8 mg total) by mouth at bedtime., Disp: 30 tablet, Rfl: 0   simvastatin (ZOCOR) 20 MG tablet, TAKE 1 TABLET BY MOUTH EVERYDAY AT BEDTIME, Disp: 90 tablet, Rfl: 3   simvastatin (ZOCOR) 40 MG tablet, Take 40 mg by mouth daily., Disp: , Rfl:    traZODone (DESYREL) 50 MG tablet, Take 0.5 tablets (25 mg total) by mouth at bedtime., Disp: 90 tablet, Rfl: 3  Observations/Objective:  Physical Exam Pulmonary:     Effort: Pulmonary effort is normal.  Neurological:     Mental Status: She is alert and oriented to person, place, and time. Mental status is at baseline.  Psychiatric:        Mood and Affect: Mood normal.        Behavior: Behavior normal.        Thought Content: Thought content normal.        Judgment: Judgment normal.    Assessment and Plan: Depression, recurrent ([place]) Assessment & Plan: Chronic.  Asymptomatic.  Currently on Paxil 5 mg every other day and would like to continue weaning medication. Continue Paxil to 5 mg every other day for 1  week and use basic daily until as discontinued.  If any symptoms recur can resume medication at lowest dose.  Also discussed switching to citalopram if symptoms do recur. Will follow-up in 3 months    Gastroesophageal reflux disease, unspecified whether esophagitis present Assessment & Plan: Chronic.  Has had to increase use of Tums.   Restart PPI  Stop famotidine Has GI appointment scheduled for next week.    Orders: -     Pantoprazole Sodium; Take 1 tablet (40 mg total) by mouth daily.  Dispense: 30 tablet; Refill: 1  Benign paroxysmal positional vertigo, unspecified laterality Assessment & Plan: Improved with vestibular rehab.      Follow Up Instructions: Return in about 3 months (around 06/11/2022).   I discussed the assessment and treatment plan with the patient. The patient was provided an opportunity to ask questions and all were answered. The patient agreed with the plan and demonstrated an understanding of the instructions.   The patient was advised to call back or seek an in-person evaluation if the symptoms worsen or if the condition fails to improve as anticipated.  The above assessment and management plan was discussed with the patient. The patient verbalized understanding of and has agreed to the management plan. Patient is aware to call the clinic if symptoms persist or worsen. Patient is aware when to return to the clinic for a follow-up visit. Patient educated on when it is appropriate to go to the emergency department.    PDMP reviewed  Carollee Leitz, MD

## 2022-03-12 NOTE — Patient Instructions (Addendum)
It was a pleasure meeting you today. Thank you for allowing me to take part in your health care.  Our goals for today as we discussed include:  Start Protonix 40 mg daily Follow up with GI as scheduled    If you have any questions or concerns, please do not hesitate to call the office at [phone].  I look forward to our next visit and until then take care and stay safe.  Regards,   Carollee Leitz, MD   [facility]

## 2022-03-13 ENCOUNTER — Encounter: Payer: Self-pay | Admitting: Family Medicine

## 2022-03-13 ENCOUNTER — Encounter: Payer: Self-pay | Admitting: Physical Therapy

## 2022-03-13 ENCOUNTER — Ambulatory Visit: Payer: Medicare HMO | Admitting: Physical Therapy

## 2022-03-13 DIAGNOSIS — R2681 Unsteadiness on feet: Secondary | ICD-10-CM

## 2022-03-13 DIAGNOSIS — R42 Dizziness and giddiness: Secondary | ICD-10-CM | POA: Diagnosis not present

## 2022-03-13 DIAGNOSIS — H8110 Benign paroxysmal vertigo, unspecified ear: Secondary | ICD-10-CM | POA: Insufficient documentation

## 2022-03-13 DIAGNOSIS — R262 Difficulty in walking, not elsewhere classified: Secondary | ICD-10-CM

## 2022-03-13 NOTE — Assessment & Plan Note (Signed)
Chronic.  Asymptomatic.  Currently on Paxil 5 mg every other day and would like to continue weaning medication. Continue Paxil to 5 mg every other day for 1 week and use basic daily until as discontinued.  If any symptoms recur can resume medication at lowest dose.  Also discussed switching to citalopram if symptoms do recur. Will follow-up in 3 months

## 2022-03-13 NOTE — Therapy (Signed)
OUTPATIENT PHYSICAL THERAPY VESTIBULAR EVALUATION     Patient Name: Dorothy Church MRN: [id number] DOB:04-22-1935, 87 y.o., female Today's Date: 03/13/2022  PCP: Carollee Leitz, MD REFERRING PROVIDER: Carollee Leitz, MD   PT End of Session - 03/13/22 1123     Visit Number 2    Number of Visits 9    Date for PT Re-Evaluation 05/01/22    PT Start Time 1118    PT Stop Time 1205    PT Time Calculation (min) 47 min    Equipment Utilized During Treatment Gait belt    Activity Tolerance Patient tolerated treatment well    Behavior During Therapy [facility] for tasks assessed/performed            Past Medical History:  Diagnosis Date   Abdominal pain    Abnormal CT of liver    Accident due to mechanical fall without injury 01/28/2022   Acute pain of left shoulder 09/28/2020   Anxiety    Anxiety 04/28/2020   Aortic stenosis    Aortic stenosis 03/04/2012   Overview:   Not confirmed by Dr Rockey Situ.    Aortic valve disorders    Arthritis    Asthma    Asthma 08/07/2012   Barrett's esophagus    [facility] last EGD 02/2018 Dr. Gustavo Lah    Barrett's esophagus 03/11/2017   Overview:  Dr Gustavo Lah   Carotid artery stenosis 12/30/2018   1-39% b/l carotids       Colon polyps    Colon polyps    COPD (chronic obstructive pulmonary disease) (HCC)    DDD (degenerative disc disease), cervical    DDD (degenerative disc disease), cervical 04/28/2020   DDD (degenerative disc disease), lumbar 01/22/2017   Depressive disorder    Diverticulosis    Erosive esophagitis    Erosive esophagitis 04/28/2020   Fundic gland polyps of stomach, benign    Gastric polyp    Gastric polyps    Gastritis    Generalized osteoarthritis of hand 05/03/2015   GERD (gastroesophageal reflux disease)    GOA (generalized osteoarthritis) 11/23/2015   Goiter    Hiatal hernia    small    Hiatal hernia    History of colonic polyps 12/08/2013   Hypercholesteremia    Hypothyroidism    Impaired fasting glucose    Impaired  fasting glucose 03/04/2012   Overview:   Glucose 111 02/2012   Intrinsic asthma, unspecified    Joint pain of ankle and foot, right 05/17/2015   Left rib fracture    Mechanical complication of knee prosthesis, initial encounter ([place]) 08/25/2017   Obstructive sleep apnea (adult) (pediatric)    used cpap x 10 years per Dr. Mortimer Fries off as of 12/30/18    OSA (obstructive sleep apnea)    used cpap x 10 years per Dr. 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   Stress 04/28/2020   Syncope and collapse    TIA (transient ischemic attack)    TIA (transient ischemic attack) 12/11/2016   TIA (transient ischemic attack) 12/11/2016   UTI (urinary tract infection)    Vitamin D deficiency    Past Surgical History:  Procedure Laterality Date  BREAST EXCISIONAL BIOPSY Left    BREAST SURGERY     CATARACT EXTRACTION W/PHACO Right 11/07/2021   Procedure: CATARACT EXTRACTION PHACO AND INTRAOCULAR LENS PLACEMENT (IOC) RIGHT  8.87 00:50.8;  Surgeon: Birder Robson, MD;  Location: [facility];  Service: Ophthalmology;  Laterality: Right;   COLONOSCOPY     COLONOSCOPY WITH ESOPHAGOGASTRODUODENOSCOPY (EGD)     COLONOSCOPY WITH PROPOFOL N/A 12/14/2014   Procedure: COLONOSCOPY WITH PROPOFOL;  Surgeon: Lollie Sails, MD;  Location: [facility] ENDOSCOPY;  Service: Endoscopy;  Laterality: N/A;   COLONOSCOPY WITH PROPOFOL N/A 03/18/2018   Procedure: COLONOSCOPY WITH PROPOFOL;  Surgeon: Lollie Sails, MD;  Location: [facility] ENDOSCOPY;  Service: Endoscopy;  Laterality: N/A;   ENUCLEATION     ESOPHAGOGASTRODUODENOSCOPY N/A 08/30/2020   Procedure: ESOPHAGOGASTRODUODENOSCOPY (EGD);  Surgeon: Lesly Rubenstein, MD;  Location:  [facility] ENDOSCOPY;  Service: Endoscopy;  Laterality: N/A;   ESOPHAGOGASTRODUODENOSCOPY (EGD) WITH PROPOFOL N/A 12/14/2014   Procedure: ESOPHAGOGASTRODUODENOSCOPY (EGD) WITH PROPOFOL;  Surgeon: Lollie Sails, MD;  Location: [facility] ENDOSCOPY;  Service: Endoscopy;  Laterality: N/A;   ESOPHAGOGASTRODUODENOSCOPY (EGD) WITH PROPOFOL N/A 10/18/2016   Procedure: ESOPHAGOGASTRODUODENOSCOPY (EGD) WITH PROPOFOL;  Surgeon: Lollie Sails, MD;  Location: [facility] ENDOSCOPY;  Service: Endoscopy;  Laterality: N/A;   ESOPHAGOGASTRODUODENOSCOPY (EGD) WITH PROPOFOL N/A 03/18/2018   Procedure: ESOPHAGOGASTRODUODENOSCOPY (EGD) WITH PROPOFOL;  Surgeon: Lollie Sails, MD;  Location: [facility] ENDOSCOPY;  Service: Endoscopy;  Laterality: N/A;   ESOPHAGOGASTRODUODENOSCOPY (EGD) WITH PROPOFOL N/A 12/19/2020   Procedure: ESOPHAGOGASTRODUODENOSCOPY (EGD) WITH PROPOFOL;  Surgeon: Lesly Rubenstein, MD;  Location: [facility] ENDOSCOPY;  Service: Endoscopy;  Laterality: N/A;   EYE SURGERY     FRACTURE SURGERY     HIP SURGERY Left    pin and plate   IR ANGIO INTRA EXTRACRAN SEL COM CAROTID INNOMINATE BILAT MOD SED  10/17/2018   IR ANGIO VERTEBRAL SEL VERTEBRAL BILAT MOD SED  10/17/2018   IR ANGIOGRAM EXTREMITY LEFT  10/17/2018   JOINT REPLACEMENT     Left Total Knee Arthroplasty     Dr Marry Guan and also had 39 years ago from 2020    nasal polyps     removed x 2 affects sense of smell x 10-15 years from 04/28/20   prosthesis eye implant     REPLACEMENT UNICONDYLAR JOINT KNEE Left    TONSILLECTOMY     TOTAL KNEE REVISION Left 09/13/2017   Procedure: TOTAL KNEE REVISION;  Surgeon: Dereck Leep, MD;  Location: [facility] ORS;  Service: Orthopedics;  Laterality: Left;   Patient Active Problem List   Diagnosis Date Noted   BPPV (benign paroxysmal positional vertigo) 03/13/2022   Allergic rhinitis 03/10/2021   Gastroesophageal reflux disease 09/28/2020   Gastric polyp 04/28/2020   Lower esophageal ring (Schatzki) 04/28/2020    Insomnia 12/30/2018   Hiatal hernia    Depression, recurrent ([place]) 03/11/2017   Asthma, chronic, severe persistent, uncomplicated 44/02/270   OA (osteoarthritis) 04/04/2015   Osteoporosis 10/07/2012   Vitamin D deficiency    Hypothyroidism    Hyperlipidemia 08/07/2012   ONSET DATE: Feb 06, 2022  REFERRING DIAG: R42 Dizziness spells  Rationale for Evaluation and Treatment Rehabilitation  THERAPY DIAG:  Dizziness and giddiness  Difficulty in walking, not elsewhere classified  Unsteadiness on feet  Rationale for Evaluation and Treatment Rehabilitation  SUBJECTIVE:   SUBJECTIVE STATEMENT: When asked if patient has had dizziness since last week, patient states she has "not that much". Pt states she thinks "some of this is balance".   Pt accompanied  by: self  PERTINENT HISTORY: Patient states that she has had issues several times before with vertigo and has had BPPV. Pt states "it usually starts with a dental appointment where they put me too far back and gets the crystals out of whack". Patient states that this most recent episode started after her dentist appt on Dec 19th.  Pt states this time it seems to have gotten a little less over time. Pt states when has previously had BPPV, she was able to get into my audiologist, who would perform treatment maneuvers. This time she could not get in to see the audiologist because she had not seen ENT in over 2 years. Patient has an ENT appointment on March 15, 2022. Dr. Volanda Napoleon, her primary MD, sent in the prescription for vestibular therapy. Pt reports that she has needed to use a SPC since onset of most recent dizziness episodes. Pt gets dizziness when rolling over in bed and reports quick movements and head and body turns bring on her symptoms. Pt states she has "difficulty not functioning right/ feels spacy and off". Pt had a mechanical fall tripping over a half step in the garage and she struck her head on the cement floor. Pt states she was  seen in the ED and had a CT scan. Patient reports that she had a witnessed fall without loss of consciousness on 01/05/2022 at her daughter's house in [place] when she tripped over a concrete half step in the garage and she fell onto concrete and struck her head. Pt was taken to the ED for care and pt reports head imaging was performed.   SYMPTOM BEHAVIOR: Non-Vestibular symptoms:  none   Type of dizziness: Imbalance (Disequilibrium), Spinning/Vertigo, and Unsteady with head/body turns  Duration: minutes  Aggravating factors: Induced by position change: rolling over, looking up or down quickly or turn head quickly, Induced by motion: looking up at the ceiling, turning body quickly, and turning head quickly, and   Relieving factors: head stationary Progression of symptoms: better History of similar episodes: yes  Auditory complaints (tinnitus, pain, drainage): pt states she has bilateral hearing aides.  Vision (last eye exam, diplopia, recent changes): Pt has an artificial left eye; pt states she had cataract surgery in right eye.    DIAGNOSTIC FINDINGS:  CT Head 04/07/2020: IMPRESSION: 1. Small right frontal scalp hematoma. No acute intracranial abnormality seen. 2. Mild multilevel degenerative disc disease. No acute abnormality seen in the cervical spine.  PAIN:  Are you having pain? No  PRECAUTIONS: Fall  FALLS: Has patient fallen in last 6 months? Yes. Number of falls 1 visiting daughter and she tripped over the half step in the garage  LIVING ENVIRONMENT: Lives with: lives alone Lives in: Other [place]: Yes: External: 1 step at front door and second floor  steps;   Has following equipment at home: Single point cane  PLOF: Independent and Independent with community mobility without device  PATIENT GOALS:Pt would like to have decreased dizziness and to strengthen her legs.   OBJECTIVE/ VESTIBULAR ASSESSMENT:  MMT: TBA next session  MMT Right 03/13/2022  Left 03/13/2022  Hip flexion    Hip abduction    Hip adduction    Hip internal rotation    Hip external rotation    Knee flexion    Knee extension    Ankle dorsiflexion    (Blank rows = not tested)  VESTIBULAR TREATMENT:  03/13/2022: TRANSFERS: Sit to stand: CGA Stand to sit: CGA Chair to  chair: CGA  GAIT: Pt arrived in a [employer] chair. Patient ambulated with [facility] multiple 150' trials. Patient ambulates with decreased gait speed with decreased step height. Patient's 10 meter walking d was 0.7 m/sec. Pt ambulates with fair scanning of environment.    [facility] PT Assessment - 03/13/22 1140       Standardized Balance Assessment   Standardized Balance Assessment Dynamic Gait Index      Dynamic Gait Index   Level Surface Mild Impairment    Change in Gait Speed Moderate Impairment    Gait with Horizontal Head Turns Mild Impairment    Gait with Vertical Head Turns Mild Impairment    Gait and Pivot Turn Mild Impairment    Step Over Obstacle Mild Impairment    Step Around Obstacles Normal    Steps Moderate Impairment    Total Score 15    DGI comment: Patient used SPC during DGI performance this date.             Clinical Test of Sensory Interaction for Balance (CTSIB): CONDITION TIME STRATEGY SWAY  Eyes open, firm surface 30 seconds ankle +1  Eyes closed, firm surface 30 seconds ankle +2  Eyes open, foam surface 30 seconds ankle +2  Eyes closed, foam surface 30 seconds Ankle, hip +3   Note: Pt reports 5/10 dizziness after doing EC on foam and required seated rest break.   VOR X 1 exercise:  Demonstrated and educated as to VOR X1.  Patient performed VOR X 1 horizontal in sitting 1 rep of 30 seconds and 1 rep of 1 minute each with verbal cues for technique.  Patient reports the target initially was jumping and after decreasing amount of head excursion and head speed, target remained in focus. Pt reports 2/10 dizziness with VOR x1 exercise and issued for HEP.    Canalith  Repositioning Maneuver: On inverted mat table, performed right Dix-Hallpike testing and no nystagmus was noted, but patient reported 3/10 "spacy feeling". Pt reports that she feels the Epley maneuver helped last session, therefore, performed right canalith repositioning maneuver (CRT) with one minute holds in each position. Repeated right CRT for a total of 2 maneuvers. Pt reported 0/10 dizziness with second maneuver.   PATIENT EDUCATION: Education details: Issued VOR X 1 for home exercise program. Issued vestibular slide handout #2 VOR.  Person educated: Patient Education method: Explanation, Demonstration, Verbal cues, and Handouts Education comprehension: verbalized understanding, returned demonstration, and verbal cues required  GOALS: Goals reviewed with patient? Yes  SHORT TERM GOALS: Target date: 04/04/2022  Pt will be independent with HEP in order to improve balance and decrease dizziness symptoms in order to decrease fall risk and improve function at home and work. Baseline: patient not currently doing any exercises;  Goal status: INITIAL  LONG TERM GOALS: Target date: 05/02/2022  Patient will have improved FOTO score of 6 points or greater in order to demonstrate improvements in patient's ADLs and functional performance.  Baseline: scored 39 on 03/06/2022;  Goal status: INITIAL  2.  Patient will demonstrate reduced falls risk as evidenced by Dynamic Gait Index (DGI) >19/24. Baseline: deferred on 03/06/2022;  Goal status: INITIAL  3.  Patient will have demonstrate decreased falls risk as indicated by Activities Specific Balance Confidence Scale score of 80% or greater. Baseline: scored 75% on 03/06/2022;  Goal status: INITIAL  4.  Patient will reduce perceived disability to low levels as indicated by <40 on Dizziness Handicap Inventory. Baseline: scored 48/100 on 03/06/2022;  Goal status: INITIAL  5.  Patient will report 50% or greater improvement in their symptoms of dizziness  and imbalance with provoking motions or positions  Baseline: reports episodes of vertigo and dizziness on 03/06/2022;  Goal status: INITIAL  ASSESSMENT:  CLINICAL IMPRESSION: Patient reports that she feels the Epley maneuver helped last session. Performed repeat Dix-Hallpike test and right canalith repositioning maneuver this date. Pt reported 0/10 dizziness with second maneuver. Patient scored 15/24 on the Dynamic Gait Index indicating increased falls risk. Patient demonstrates difficulty with ambulation with head and body turns and with stepping over objects. Patient issued VOR X1 for home exercise program. Pt will benefit from skilled PT services to address functional deficits and to try to decrease patient's falls risk.   OBJECTIVE IMPAIRMENTS decreased balance, decreased mobility, difficulty walking, decreased strength, and dizziness.   ACTIVITY LIMITATIONS cleaning, driving, and community activity.   PERSONAL FACTORS Past/current experiences, Time since onset of injury/illness/exacerbation, and 3+ comorbidities: anxiety, depressive disorder, carotid artery stenosis, COPD, and TIA      REHAB POTENTIAL: Good  CLINICAL DECISION MAKING: Evolving/moderate complexity  EVALUATION COMPLEXITY: Moderate  PLAN: PT FREQUENCY: 1x/week  PT DURATION: 8 weeks  PLANNED INTERVENTIONS: Therapeutic exercises, Therapeutic activity, Neuromuscular re-education, Balance training, Gait training, Patient/Family education, Self Care, Stair training, Vestibular training, and Canalith repositioning  PLAN FOR NEXT SESSION: Test head impulse test, review VOR X1, try feet together and semi-tandem progressions with head turns next session.    Lady Deutscher PT, DPT Lady Deutscher, PT 03/13/2022, 2:21 PM

## 2022-03-13 NOTE — Assessment & Plan Note (Signed)
Chronic.  Has had to increase use of Tums.   Restart PPI  Stop famotidine Has GI appointment scheduled for next week.

## 2022-03-13 NOTE — Assessment & Plan Note (Signed)
Improved with vestibular rehab.

## 2022-03-15 ENCOUNTER — Other Ambulatory Visit: Payer: Self-pay | Admitting: Primary Care

## 2022-03-15 DIAGNOSIS — J309 Allergic rhinitis, unspecified: Secondary | ICD-10-CM

## 2022-03-17 ENCOUNTER — Other Ambulatory Visit: Payer: Self-pay | Admitting: Family Medicine

## 2022-03-17 DIAGNOSIS — G4700 Insomnia, unspecified: Secondary | ICD-10-CM

## 2022-03-19 ENCOUNTER — Other Ambulatory Visit: Payer: Self-pay | Admitting: Podiatry

## 2022-03-20 ENCOUNTER — Encounter: Payer: Self-pay | Admitting: Physical Therapy

## 2022-03-20 ENCOUNTER — Ambulatory Visit: Payer: Medicare HMO | Admitting: Physical Therapy

## 2022-03-20 DIAGNOSIS — R2681 Unsteadiness on feet: Secondary | ICD-10-CM | POA: Diagnosis not present

## 2022-03-20 DIAGNOSIS — R42 Dizziness and giddiness: Secondary | ICD-10-CM

## 2022-03-20 DIAGNOSIS — R262 Difficulty in walking, not elsewhere classified: Secondary | ICD-10-CM | POA: Diagnosis not present

## 2022-03-20 DIAGNOSIS — K219 Gastro-esophageal reflux disease without esophagitis: Secondary | ICD-10-CM | POA: Diagnosis not present

## 2022-03-20 DIAGNOSIS — Z79899 Other long term (current) drug therapy: Secondary | ICD-10-CM | POA: Diagnosis not present

## 2022-03-20 NOTE — Telephone Encounter (Signed)
L/M FOR PT. TO C/B.

## 2022-03-20 NOTE — Therapy (Addendum)
OUTPATIENT PHYSICAL THERAPY VESTIBULAR TREATMENT     Patient Name: Dorothy Church MRN: [id number] DOB:05/11/1935, 87 y.o., female Today's Date: 03/13/2022  PCP: Carollee Leitz, MD REFERRING PROVIDER: Carollee Leitz, MD   PT End of Session - 03/13/22 1123     Visit Number 2    Number of Visits 9    Date for PT Re-Evaluation 05/01/22    PT Start Time 1118    PT Stop Time 1205    PT Time Calculation (min) 47 min    Equipment Utilized During Treatment Gait belt    Activity Tolerance Patient tolerated treatment well    Behavior During Therapy [facility] for tasks assessed/performed            Past Medical History:  Diagnosis Date   Abdominal pain    Abnormal CT of liver    Accident due to mechanical fall without injury 01/28/2022   Acute pain of left shoulder 09/28/2020   Anxiety    Anxiety 04/28/2020   Aortic stenosis    Aortic stenosis 03/04/2012   Overview:   Not confirmed by Dr Rockey Situ.    Aortic valve disorders    Arthritis    Asthma    Asthma 08/07/2012   Barrett's esophagus    [facility] last EGD 02/2018 Dr. Gustavo Lah    Barrett's esophagus 03/11/2017   Overview:  Dr Gustavo Lah   Carotid artery stenosis 12/30/2018   1-39% b/l carotids       Colon polyps    Colon polyps    COPD (chronic obstructive pulmonary disease) (HCC)    DDD (degenerative disc disease), cervical    DDD (degenerative disc disease), cervical 04/28/2020   DDD (degenerative disc disease), lumbar 01/22/2017   Depressive disorder    Diverticulosis    Erosive esophagitis    Erosive esophagitis 04/28/2020   Fundic gland polyps of stomach, benign    Gastric polyp    Gastric polyps    Gastritis    Generalized osteoarthritis of hand 05/03/2015   GERD (gastroesophageal reflux disease)    GOA (generalized osteoarthritis) 11/23/2015   Goiter    Hiatal hernia    small    Hiatal hernia    History of colonic polyps 12/08/2013   Hypercholesteremia    Hypothyroidism    Impaired fasting glucose    Impaired  fasting glucose 03/04/2012   Overview:   Glucose 111 02/2012   Intrinsic asthma, unspecified    Joint pain of ankle and foot, right 05/17/2015   Left rib fracture    Mechanical complication of knee prosthesis, initial encounter ([place]) 08/25/2017   Obstructive sleep apnea (adult) (pediatric)    used cpap x 10 years per Dr. Mortimer Fries off as of 12/30/18    OSA (obstructive sleep apnea)    used cpap x 10 years per Dr. 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   Stress 04/28/2020   Syncope and collapse    TIA (transient ischemic attack)    TIA (transient ischemic attack) 12/11/2016   TIA (transient ischemic attack) 12/11/2016   UTI (urinary tract infection)    Vitamin D deficiency    Past Surgical History:  Procedure Laterality Date  BREAST EXCISIONAL BIOPSY Left    BREAST SURGERY     CATARACT EXTRACTION W/PHACO Right 11/07/2021   Procedure: CATARACT EXTRACTION PHACO AND INTRAOCULAR LENS PLACEMENT (IOC) RIGHT  8.87 00:50.8;  Surgeon: Birder Robson, MD;  Location: [place];  Service: Ophthalmology;  Laterality: Right;   COLONOSCOPY     COLONOSCOPY WITH ESOPHAGOGASTRODUODENOSCOPY (EGD)     COLONOSCOPY WITH PROPOFOL N/A 12/14/2014   Procedure: COLONOSCOPY WITH PROPOFOL;  Surgeon: Lollie Sails, MD;  Location: [facility] ENDOSCOPY;  Service: Endoscopy;  Laterality: N/A;   COLONOSCOPY WITH PROPOFOL N/A 03/18/2018   Procedure: COLONOSCOPY WITH PROPOFOL;  Surgeon: Lollie Sails, MD;  Location: [facility] ENDOSCOPY;  Service: Endoscopy;  Laterality: N/A;   ENUCLEATION     ESOPHAGOGASTRODUODENOSCOPY N/A 08/30/2020   Procedure: ESOPHAGOGASTRODUODENOSCOPY (EGD);  Surgeon: Lesly Rubenstein, MD;  Location:  [facility] ENDOSCOPY;  Service: Endoscopy;  Laterality: N/A;   ESOPHAGOGASTRODUODENOSCOPY (EGD) WITH PROPOFOL N/A 12/14/2014   Procedure: ESOPHAGOGASTRODUODENOSCOPY (EGD) WITH PROPOFOL;  Surgeon: Lollie Sails, MD;  Location: [facility] ENDOSCOPY;  Service: Endoscopy;  Laterality: N/A;   ESOPHAGOGASTRODUODENOSCOPY (EGD) WITH PROPOFOL N/A 10/18/2016   Procedure: ESOPHAGOGASTRODUODENOSCOPY (EGD) WITH PROPOFOL;  Surgeon: Lollie Sails, MD;  Location: [facility] ENDOSCOPY;  Service: Endoscopy;  Laterality: N/A;   ESOPHAGOGASTRODUODENOSCOPY (EGD) WITH PROPOFOL N/A 03/18/2018   Procedure: ESOPHAGOGASTRODUODENOSCOPY (EGD) WITH PROPOFOL;  Surgeon: Lollie Sails, MD;  Location: [facility]COPY;  Service: Endoscopy;  Laterality: N/A;   ESOPHAGOGASTRODUODENOSCOPY (EGD) WITH PROPOFOL N/A 12/19/2020   Procedure: ESOPHAGOGASTRODUODENOSCOPY (EGD) WITH PROPOFOL;  Surgeon: Lesly Rubenstein, MD;  Location: [facility] ENDOSCOPY;  Service: Endoscopy;  Laterality: N/A;   EYE SURGERY     FRACTURE SURGERY     HIP SURGERY Left    pin and plate   IR ANGIO INTRA EXTRACRAN SEL COM CAROTID INNOMINATE BILAT MOD SED  10/17/2018   IR ANGIO VERTEBRAL SEL VERTEBRAL BILAT MOD SED  10/17/2018   IR ANGIOGRAM EXTREMITY LEFT  10/17/2018   JOINT REPLACEMENT     Left Total Knee Arthroplasty     Dr Marry Guan and also had 39 years ago from 2020    nasal polyps     removed x 2 affects sense of smell x 10-15 years from 04/28/20   prosthesis eye implant     REPLACEMENT UNICONDYLAR JOINT KNEE Left    TONSILLECTOMY     TOTAL KNEE REVISION Left 09/13/2017   Procedure: TOTAL KNEE REVISION;  Surgeon: Dereck Leep, MD;  Location: [facility];  Service: Orthopedics;  Laterality: Left;   Patient Active Problem List   Diagnosis Date Noted   BPPV (benign paroxysmal positional vertigo) 03/13/2022   Allergic rhinitis 03/10/2021   Gastroesophageal reflux disease 09/28/2020   Gastric polyp 04/28/2020   Lower esophageal ring (Schatzki) 04/28/2020    Insomnia 12/30/2018   Hiatal hernia    Depression, recurrent ([place]) 03/11/2017   Asthma, chronic, severe persistent, uncomplicated 27/04/5007   OA (osteoarthritis) 04/04/2015   Osteoporosis 10/07/2012   Vitamin D deficiency    Hypothyroidism    Hyperlipidemia 08/07/2012   ONSET DATE: Feb 06, 2022  REFERRING DIAG: R42 Dizziness spells  Rationale for Evaluation and Treatment Rehabilitation  THERAPY DIAG:  Dizziness and giddiness  Difficulty in walking, not elsewhere classified  Unsteadiness on feet  Rationale for Evaluation and Treatment Rehabilitation  SUBJECTIVE:   SUBJECTIVE STATEMENT: Patient states that she had some "spacy"/unsteadiness where she felt she was not in control of what she was doing at times this past week. Pt states that she participated  in a virtual-type event this past week that was challenging for her balance. Pt states that she tried the VOR exercise and states she would sit for a minute after she got done doing the exercise.    Pt accompanied by: self  PERTINENT HISTORY: Patient states that she has had issues several times before with vertigo and has had BPPV. Pt states "it usually starts with a dental appointment where they put me too far back and gets the crystals out of whack". Patient states that this most recent episode started after her dentist appt on Dec 19th.  Pt states this time it seems to have gotten a little less over time. Pt states when has previously had BPPV, she was able to get into my audiologist, who would perform treatment maneuvers. This time she could not get in to see the audiologist because she had not seen ENT in over 2 years. Patient has an ENT appointment on March 15, 2022. Dr. Volanda Napoleon, her primary MD, sent in the prescription for vestibular therapy. Pt reports that she has needed to use a SPC since onset of most recent dizziness episodes. Pt gets dizziness when rolling over in bed and reports quick movements and head and body turns  bring on her symptoms. Pt states she has "difficulty not functioning right/ feels spacy and off". Pt had a mechanical fall tripping over a half step in the garage and she struck her head on the cement floor. Pt states she was seen in the ED and had a CT scan. Patient reports that she had a witnessed fall without loss of consciousness on 01/05/2022 at her daughter's house in [place] when she tripped over a concrete half step in the garage and she fell onto concrete and struck her head. Pt was taken to the ED for care and pt reports head imaging was performed.   SYMPTOM BEHAVIOR: Non-Vestibular symptoms: none  Type of dizziness: Imbalance (Disequilibrium), Spinning/Vertigo, and Unsteady with head/body turns  Duration: minutes  Aggravating factors: Induced by position change: rolling over, looking up or down quickly or turn head quickly, Induced by motion: looking up at the ceiling, turning body quickly, and turning head quickly, and  Relieving factors: head stationary Progression of symptoms: better History of similar episodes: yes  Auditory complaints (tinnitus, pain, drainage): pt states she has bilateral hearing aides.  Vision (last eye exam, diplopia, recent changes): Pt has an artificial left eye; pt states she had cataract surgery in right eye.    DIAGNOSTIC FINDINGS:  CT Head 04/07/2020: IMPRESSION: 1. Small right frontal scalp hematoma. No acute intracranial abnormality seen. 2. Mild multilevel degenerative disc disease. No acute abnormality seen in the cervical spine.  PAIN:  Are you having pain? No  PRECAUTIONS: Fall  FALLS: Has patient fallen in last 6 months? Yes. Number of falls 1 visiting daughter and she tripped over the half step in the garage  LIVING ENVIRONMENT: Lives with: lives alone Lives in: Other [place]: Yes: External: 1 step at front door and second floor  steps;  Has following equipment at home: Single point cane  PLOF: Independent and  Independent with community mobility without device  PATIENT GOALS:Pt would like to have decreased dizziness and to strengthen her legs.   OBJECTIVE/ VESTIBULAR ASSESSMENT:   VESTIBULAR TREATMENT:  03/20/2022: Note: patient arrived this date ambulating to/from the department without use of AD.   VOR X 1 exercise:  Patient performed VOR X 1 horizontal in sitting 2 reps of 1  minute each with verbal cues for technique. Pt initially turning head and eyes together and required cuing for technique and amount of head excursion. After cuing, pt able to perform with good form.  Patient performed VOR X 1 horizontal in standing 1 rep of 1 minute each with verbal cues for technique.  Added this progression to home exercise program.  Airex pad:  On firm surface, patient performed feet together progressions, progressed to feet together and semi-tandem progressions with alternating lead leg with and without horizontal and vertical head turns. Then on Airex pad, patient performed feet together progressions progressed to feet about 1.5" apart and semi-tandem progressions with alternating lead leg with and without horizontal and vertical head turns.  Pt required CGA for above activities and demonstrated increased challenge on Airex pad and with semi-tandem with right leg forward.  Discussed safety precautions with performing HEP at home. Demonstrated and discussed standing in corner with chair in front for safety and then patient demonstrated.  MMT:  MMT Left 03/20/2022 Right 03/20/2022  Hip flexion 4/5 5/5  Hip abduction -4/5 4/5  Hip adduction 4/5 4/5  Knee flexion 5/5 4/5  Knee extension 5/5 5/5  Ankle dorsiflexion 5/5 5/5  ROM: Pt WNL right knee flexion/extension. Pt with decreased left knee flexion/extension Patient with grossly left AROM knee flexion to 85 degrees. Pt reports she has had 2 left TKR with the most recent one being in 2018 or 2019.   Ambulation with head turns:  Patient performed  100' trial each of forwards and retro ambulation without AD while scanning for targets in the hallway with CGA.  Patient demonstrates no veering but needed to decrease gait speed and step length with retro ambulation with head turns. Pt reports that this activity was challenging for her balance.   03/13/2022: TRANSFERS: Sit to stand: CGA Stand to sit: CGA Chair to chair: CGA  GAIT: Pt arrived in a [employer] chair. Patient ambulated with [facility] multiple 150' trials. Patient ambulates with decreased gait speed with decreased step height. Patient's 10 meter walking spped was 0.7 m/sec. Pt ambulates with fair scanning of environment.    [facility] PT Assessment - 03/13/22 1140       Standardized Balance Assessment   Standardized Balance Assessment Dynamic Gait Index      Dynamic Gait Index   Level Surface Mild Impairment    Change in Gait Speed Moderate Impairment    Gait with Horizontal Head Turns Mild Impairment    Gait with Vertical Head Turns Mild Impairment    Gait and Pivot Turn Mild Impairment    Step Over Obstacle Mild Impairment    Step Around Obstacles Normal    Steps Moderate Impairment    Total Score 15    DGI comment: Patient used SPC during DGI performance this date.            Clinical Test of Sensory Interaction for Balance (CTSIB): CONDITION TIME STRATEGY SWAY  Eyes open, firm surface 30 seconds ankle +1  Eyes closed, firm surface 30 seconds ankle +2  Eyes open, foam surface 30 seconds ankle +2  Eyes closed, foam surface 30 seconds Ankle, hip +3   Note: Pt reports 5/10 dizziness after doing EC on foam and required seated rest break.   VOR X 1 exercise:  Demonstrated and educated as to VOR X1.  Patient performed VOR X 1 horizontal in sitting 1 rep of 30 seconds and 1 rep of 1 minute each with verbal cues for technique.  Patient reports  the target initially was jumping and after decreasing amount of head excursion and head speed, target remained in focus. Pt reports 2/10  dizziness with VOR x1 exercise and issued for HEP.    Canalith Repositioning Maneuver: On inverted mat table, performed right Dix-Hallpike testing and no nystagmus was noted, but patient reported 3/10 "spacy feeling". Pt reports that she feels the Epley maneuver helped last session, therefore, performed right canalith repositioning maneuver (CRT) with one minute holds in each position. Repeated right CRT for a total of 2 maneuvers. Pt reported 0/10 dizziness with second maneuver.   PATIENT EDUCATION: Education details: Issued Energy manager #1, 2 VOR in standing, 4 feet together with head turns, 5 semi-tandem stance with head turns. Discussed safety precautions with performing HEP at home. Demonstrated and discussed standing in corner with chair in front for safety and then patient demonstrated. Person educated: Patient Education method: Explanation, Demonstration, Verbal cues, and Handouts Education comprehension: verbalized understanding, returned demonstration, and verbal cues required  GOALS: Goals reviewed with patient? Yes  SHORT TERM GOALS: Target date: 04/04/2022  Pt will be independent with HEP in order to improve balance and decrease dizziness symptoms in order to decrease fall risk and improve function at home and work. Baseline: patient not currently doing any exercises;  Goal status: INITIAL  LONG TERM GOALS: Target date: 05/02/2022  Patient will have improved FOTO score of 6 points or greater in order to demonstrate improvements in patient's ADLs and functional performance.  Baseline: scored 39 on 03/06/2022;  Goal status: INITIAL  2.  Patient will demonstrate reduced falls risk as evidenced by Dynamic Gait Index (DGI) >19/24. Baseline: deferred on 03/06/2022;  Goal status: INITIAL  3.  Patient will have demonstrate decreased falls risk as indicated by Activities Specific Balance Confidence Scale score of 80% or greater. Baseline: scored 75% on 03/06/2022;  Goal  status: INITIAL  4.  Patient will reduce perceived disability to low levels as indicated by <40 on Dizziness Handicap Inventory. Baseline: scored 48/100 on 03/06/2022;  Goal status: INITIAL  5.  Patient will report 50% or greater improvement in their symptoms of dizziness and imbalance with provoking motions or positions  Baseline: reports episodes of vertigo and dizziness on 03/06/2022;  Goal status: INITIAL  ASSESSMENT:  CLINICAL IMPRESSION: Pt reports compliance with VOR X 1 exercise at home but when demonstrated in the clinic, pt required cuing as she had been performing with her head and eyes turning side to side and without keeping eyes on the target. Patient able to perform VOR X1 with good technique after cuing and was able to progress to performing in standing. Added additional exercises for home exercise program with handout provided. Patient challenged by narrow base of support and uneven surfaces with head turning activities. In addition, patient challenged by ambulation with head turns. Pt will benefit from skilled PT services to address balance, strength and vestibular deficits, and to try to decrease patient's falls risk.   OBJECTIVE IMPAIRMENTS decreased balance, decreased mobility, difficulty walking, decreased strength, and dizziness.   ACTIVITY LIMITATIONS cleaning, driving, and community activity.   PERSONAL FACTORS Past/current experiences, Time since onset of injury/illness/exacerbation, and 3+ comorbidities: anxiety, depressive disorder, carotid artery stenosis, COPD, and TIA     REHAB POTENTIAL: Good  CLINICAL DECISION MAKING: Evolving/moderate complexity  EVALUATION COMPLEXITY: Moderate  PLAN: PT FREQUENCY: 1x/week  PT DURATION: 8 weeks  PLANNED INTERVENTIONS: Therapeutic exercises, Therapeutic activity, Neuromuscular re-education, Balance training, Gait training, Patient/Family education, Self Care, Stair training, Vestibular training,  and Canalith  repositioning  PLAN FOR NEXT SESSION: Test head impulse test, review VOR X1, try feet together and semi-tandem progressions with head turns next session.    Lady Deutscher PT, DPT  Lady Deutscher, PT 03/22/2022, 5:03 PM

## 2022-03-21 ENCOUNTER — Encounter: Payer: Self-pay | Admitting: Physical Therapy

## 2022-03-26 ENCOUNTER — Ambulatory Visit: Payer: Medicare HMO | Attending: Family Medicine | Admitting: Physical Therapy

## 2022-03-26 ENCOUNTER — Encounter: Payer: Self-pay | Admitting: Physical Therapy

## 2022-03-26 DIAGNOSIS — R262 Difficulty in walking, not elsewhere classified: Secondary | ICD-10-CM | POA: Insufficient documentation

## 2022-03-26 DIAGNOSIS — R2681 Unsteadiness on feet: Secondary | ICD-10-CM | POA: Insufficient documentation

## 2022-03-26 DIAGNOSIS — R42 Dizziness and giddiness: Secondary | ICD-10-CM | POA: Insufficient documentation

## 2022-03-26 NOTE — Therapy (Signed)
Patient arrives to clinic and states that she would like to hold PT services for a few weeks. Pt states she feels the "spaciness" she has been feeling was from stopping her Paxil medication that she has been on for 20 years. She called her doctor, and they talked about how to gradually wean off this medication. Pt states she has had stomach issues all last week which she feels are due to stopping her Paxil and from drinking expired Pedialyte. Pt states she feels she wants to finish coming off the Paxil before she comes back for more rehab services if needed. Patient's next appointment was set for Feb 26th, and patient to call clinic if she feels, she needs to push that further out or if she feels her symptoms have resolved and she no longer needs rehab services.  Lady Deutscher PT, DPT [phone]

## 2022-03-30 ENCOUNTER — Encounter: Payer: Medicare HMO | Admitting: Physical Therapy

## 2022-04-01 ENCOUNTER — Other Ambulatory Visit: Payer: Self-pay | Admitting: Primary Care

## 2022-04-01 DIAGNOSIS — J4520 Mild intermittent asthma, uncomplicated: Secondary | ICD-10-CM

## 2022-04-04 ENCOUNTER — Encounter: Payer: Medicare HMO | Admitting: Physical Therapy

## 2022-04-06 ENCOUNTER — Encounter: Payer: Medicare HMO | Admitting: Physical Therapy

## 2022-04-08 ENCOUNTER — Other Ambulatory Visit: Payer: Self-pay | Admitting: Family

## 2022-04-08 DIAGNOSIS — E039 Hypothyroidism, unspecified: Secondary | ICD-10-CM

## 2022-04-09 ENCOUNTER — Other Ambulatory Visit (HOSPITAL_COMMUNITY): Payer: Self-pay

## 2022-04-09 ENCOUNTER — Encounter: Payer: Self-pay | Admitting: Internal Medicine

## 2022-04-09 DIAGNOSIS — J4520 Mild intermittent asthma, uncomplicated: Secondary | ICD-10-CM

## 2022-04-09 NOTE — Telephone Encounter (Signed)
Tried to submit new PA with this information through [facility] and received this note. Looks like it will need an appeal.  Please be advised we currently do not have a Pharmacist to review denials, therefore you will need to process appeals accordingly as needed. Thanks for your support at this time.

## 2022-04-09 NOTE — Telephone Encounter (Signed)
Patient Advocate Encounter   Received notification from [facility] that prior authorization for Ramelteon is required.   PA submitted on 04/09/2022 Key [id number] Status is pending

## 2022-04-09 NOTE — Telephone Encounter (Signed)
Pharmacy Patient Advocate Encounter  Received notification from [facility] that the request for prior authorization for Ramelteon has been denied due to .    Please be advised we currently do not have a Pharmacist to review denials, therefore you will need to process appeals accordingly as needed. Thanks for your support at this time.   You may call [phone] or fax [phone], to appeal.

## 2022-04-10 MED ORDER — ALBUTEROL SULFATE HFA 108 (90 BASE) MCG/ACT IN AERS: 2.0000 | INHALATION_SPRAY | Freq: Four times a day (QID) | RESPIRATORY_TRACT | 5 refills | Status: DC | PRN

## 2022-04-11 ENCOUNTER — Encounter: Payer: Medicare HMO | Admitting: Physical Therapy

## 2022-04-12 ENCOUNTER — Encounter: Payer: Self-pay | Admitting: Physical Therapy

## 2022-04-13 ENCOUNTER — Encounter: Payer: Self-pay | Admitting: Family Medicine

## 2022-04-13 DIAGNOSIS — Z961 Presence of intraocular lens: Secondary | ICD-10-CM | POA: Diagnosis not present

## 2022-04-13 DIAGNOSIS — H401110 Primary open-angle glaucoma, right eye, stage unspecified: Secondary | ICD-10-CM | POA: Diagnosis not present

## 2022-04-13 NOTE — Telephone Encounter (Signed)
I called the patient and she stated she is taking 10 mg of the Paxil and I informed her to increase to 20 mg and see if it helps and if it does not try the original of 30 mg and she understood.  I also informed her to take 75 mg of the trazodone for sleep, she is taking 50 mg I informe dher to take 1 1/2 tablets for sleep and she understood.  Alyxandra Tenbrink,cma

## 2022-04-16 ENCOUNTER — Other Ambulatory Visit: Payer: Self-pay | Admitting: Primary Care

## 2022-04-16 ENCOUNTER — Ambulatory Visit: Payer: Medicare HMO | Admitting: Physical Therapy

## 2022-04-16 DIAGNOSIS — J309 Allergic rhinitis, unspecified: Secondary | ICD-10-CM

## 2022-04-19 ENCOUNTER — Other Ambulatory Visit: Payer: Self-pay | Admitting: Podiatry

## 2022-04-19 ENCOUNTER — Other Ambulatory Visit: Payer: Self-pay | Admitting: Family Medicine

## 2022-04-19 DIAGNOSIS — K219 Gastro-esophageal reflux disease without esophagitis: Secondary | ICD-10-CM

## 2022-05-01 ENCOUNTER — Other Ambulatory Visit: Payer: Self-pay | Admitting: Primary Care

## 2022-05-01 DIAGNOSIS — J309 Allergic rhinitis, unspecified: Secondary | ICD-10-CM

## 2022-05-10 ENCOUNTER — Other Ambulatory Visit: Payer: Self-pay | Admitting: Primary Care

## 2022-05-10 DIAGNOSIS — J309 Allergic rhinitis, unspecified: Secondary | ICD-10-CM

## 2022-06-03 ENCOUNTER — Other Ambulatory Visit: Payer: Self-pay | Admitting: Family Medicine

## 2022-06-03 DIAGNOSIS — F339 Major depressive disorder, recurrent, unspecified: Secondary | ICD-10-CM

## 2022-06-05 NOTE — Telephone Encounter (Signed)
Left message to return call to our office.  

## 2022-06-08 ENCOUNTER — Telehealth: Payer: Self-pay

## 2022-06-08 MED ORDER — SIMVASTATIN 20 MG PO TABS: ORAL_TABLET | ORAL | 3 refills | Status: DC

## 2022-06-08 NOTE — Telephone Encounter (Signed)
Prescription Request  06/08/2022  LOV: Visit date not found  What is the name of the medication or equipment?  simvastatin (ZOCOR) 20 MG tablet   Have you contacted your pharmacy to request a refill? Yes   Which pharmacy would you like this sent to?  [id number] [place] [address] [place] [address] Phone: [phone] Fax: [phone]    Patient notified that their request is being sent to the clinical staff for review and that they should receive a response within 2 business days.   Please advise at Mobile [phone] (mobile)  Patient states she spoke with her pharmacy and they told her that they faxed information to Dr. Dana Allan, but they have not heard back from her.  Patient states she has been out of this medication almost a week, so she would like to have it as soon as possible.

## 2022-06-08 NOTE — Telephone Encounter (Signed)
Called and spoke with pt in regards to this refill has been sent, pt confirmed she is taking 20 mg.

## 2022-06-10 NOTE — Progress Notes (Signed)
SUBJECTIVE:  No chief complaint on file.  HPI Patient presents to clinic to discuss mood disorder and medication.  At last visit had discussed continuing Paxil 5 mg daily and continuing to wean. Patient had issues with dizziness and thought weaning from Paxil was withdrawal.  Had discussed increasing medication back to original dose of 30 mg daily.  At this time she is currently taking 30 mg daily.  She reports not sleeping well at night with current dose of trazodone.  She is worried about her 85 year old son who is in [place], recently lost his job and husband.  She is also concerned about her grandson who has just graduated with a masters degree and cannot find financial securement and is moving to a new state with his wife for her job.  She is currently living at [facility] but lives alone.  PERTINENT PMH / PSH Mood disorder Sleep disorder   OBJECTIVE:  BP 110/70   Pulse 87   Temp 98.2 F (36.8 C)   Ht 5\' 6"  (1.676 m)   Wt 137 lb 3.2 oz (62.2 kg)   SpO2 95%   BMI 22.14 kg/m    Physical Exam Vitals reviewed.  Constitutional:      General: She is not in acute distress.    Appearance: Normal appearance. She is normal weight. She is not ill-appearing, toxic-appearing or diaphoretic.  Eyes:     General:        Right eye: No discharge.        Left eye: No discharge.     Conjunctiva/sclera: Conjunctivae normal.  Cardiovascular:     Rate and Rhythm: Normal rate.  Pulmonary:     Effort: Pulmonary effort is normal.  Musculoskeletal:        General: Normal range of motion.  Skin:    General: Skin is warm and dry.  Neurological:     General: No focal deficit present.     Mental Status: She is alert and oriented to person, place, and time. Mental status is at baseline.  Psychiatric:        Mood and Affect: Mood normal.        Behavior: Behavior normal.        Thought Content: Thought content normal.        Judgment: Judgment normal.     ASSESSMENT/PLAN:   Depression, recurrent (HCC) Assessment & Plan: Chronic.  Try to wean off Paxil but was unsuccessful in back to 30 mg daily.  Was previously on 60 mg years ago. Continue Paxil 30 mg daily Encouraged CBT Long discussion surrounding stress management and techniques Offered psychiatry consult, patient politely declined at this time. Will check labs to ensure no underlying etiology Follow-up as needed   Orders: -     PARoxetine HCl; Take 1 tablet (30 mg total) by mouth daily.  Dispense: 90 tablet; Refill: 3 -     CBC with Differential/Platelet -     TSH -     Comprehensive metabolic panel  Insomnia, unspecified type Assessment & Plan: Chronic.  Now has difficulties staying asleep.  Has not tried ramelteon as not covered under insurance.  Trazodone has increased from 25 mg to 50 mg. -Continue sleep hygiene -Increase trazodone to 75 mg nightly -Follow-up in 3 months   Orders: -     traZODone HCl; Take 1 tablet (100 mg total) by mouth at bedtime.  Dispense: 90 tablet; Refill: 1  Impaired fasting glucose -     Hemoglobin A1c  PDMP reviewed  Return in about 3 months (around 09/10/2022) for PCP.  Dana Allan, MD

## 2022-06-10 NOTE — Patient Instructions (Incomplete)
It was a pleasure meeting you today. Thank you for allowing me to take part in your health care.  Our goals for today as we discussed include:  Continue Paxil 30 mg daily  Schedule Medicare Annual Wellness Visit     If you have any questions or concerns, please do not hesitate to call the office at [phone].  I look forward to our next visit and until then take care and stay safe.  Regards,   Dana Allan, MD   [facility]

## 2022-06-10 NOTE — Telephone Encounter (Signed)
Will refill at [facility] on 06/11/22

## 2022-06-11 ENCOUNTER — Ambulatory Visit (INDEPENDENT_AMBULATORY_CARE_PROVIDER_SITE_OTHER): Payer: Medicare HMO | Admitting: Family Medicine

## 2022-06-11 VITALS — BP 110/70 | HR 87 | Temp 98.2°F | Ht 66.0 in | Wt 137.2 lb

## 2022-06-11 DIAGNOSIS — R7301 Impaired fasting glucose: Secondary | ICD-10-CM | POA: Diagnosis not present

## 2022-06-11 DIAGNOSIS — F339 Major depressive disorder, recurrent, unspecified: Secondary | ICD-10-CM | POA: Diagnosis not present

## 2022-06-11 DIAGNOSIS — G4700 Insomnia, unspecified: Secondary | ICD-10-CM | POA: Diagnosis not present

## 2022-06-11 MED ORDER — TRAZODONE HCL 100 MG PO TABS: 100.0000 mg | ORAL_TABLET | Freq: Every day | ORAL | 1 refills | Status: DC

## 2022-06-11 MED ORDER — PAROXETINE HCL 30 MG PO TABS: 30.0000 mg | ORAL_TABLET | Freq: Every day | ORAL | 3 refills | Status: DC

## 2022-06-12 LAB — CBC WITH DIFFERENTIAL/PLATELET
Basophils Absolute: 0 10*3/uL (ref 0.0–0.1)
Basophils Relative: 0.7 % (ref 0.0–3.0)
Eosinophils Absolute: 0.2 10*3/uL (ref 0.0–0.7)
Eosinophils Relative: 3.9 % (ref 0.0–5.0)
HCT: 40.1 % (ref 36.0–46.0)
Hemoglobin: 13.5 g/dL (ref 12.0–15.0)
Lymphocytes Relative: 16.7 % (ref 12.0–46.0)
Lymphs Abs: 0.9 10*3/uL (ref 0.7–4.0)
MCHC: 33.6 g/dL (ref 30.0–36.0)
MCV: 93.1 fl (ref 78.0–100.0)
Monocytes Absolute: 0.5 10*3/uL (ref 0.1–1.0)
Monocytes Relative: 9.5 % (ref 3.0–12.0)
Neutro Abs: 3.9 10*3/uL (ref 1.4–7.7)
Neutrophils Relative %: 69.2 % (ref 43.0–77.0)
Platelets: 206 10*3/uL (ref 150.0–400.0)
RBC: 4.31 Mil/uL (ref 3.87–5.11)
RDW: 13.5 % (ref 11.5–15.5)
WBC: 5.6 10*3/uL (ref 4.0–10.5)

## 2022-06-12 LAB — COMPREHENSIVE METABOLIC PANEL
ALT: 11 U/L (ref 0–35)
AST: 15 U/L (ref 0–37)
Albumin: 4 g/dL (ref 3.5–5.2)
Alkaline Phosphatase: 38 U/L — ABNORMAL LOW (ref 39–117)
BUN: 17 mg/dL (ref 6–23)
CO2: 32 mEq/L (ref 19–32)
Calcium: 9.3 mg/dL (ref 8.4–10.5)
Chloride: 101 mEq/L (ref 96–112)
Creatinine, Ser: 0.92 mg/dL (ref 0.40–1.20)
GFR: 57.18 mL/min — ABNORMAL LOW (ref >60.00)
Glucose, Bld: 90 mg/dL (ref 70–99)
Potassium: 4.3 mEq/L (ref 3.5–5.1)
Sodium: 140 mEq/L (ref 135–145)
Total Bilirubin: 0.6 mg/dL (ref 0.2–1.2)
Total Protein: 6 g/dL (ref 6.0–8.3)

## 2022-06-12 LAB — TSH: TSH: 1.89 u[IU]/mL (ref 0.35–5.50)

## 2022-06-12 LAB — HEMOGLOBIN A1C: Hgb A1c MFr Bld: 5.3 % (ref 4.6–6.5)

## 2022-06-14 ENCOUNTER — Ambulatory Visit (INDEPENDENT_AMBULATORY_CARE_PROVIDER_SITE_OTHER): Payer: Medicare HMO

## 2022-06-14 VITALS — Ht 66.0 in | Wt 137.0 lb

## 2022-06-14 DIAGNOSIS — Z0000 Encounter for general adult medical examination without abnormal findings: Secondary | ICD-10-CM

## 2022-06-14 NOTE — Patient Instructions (Addendum)
Dorothy Church , Thank you for taking time to come for your Medicare Wellness Visit. I appreciate your ongoing commitment to your health goals. Please review the following plan we discussed and let me know if I can assist you in the future.   These are the goals we discussed:  Goals       Patient Stated     Maintain Healthy Lifestyle (pt-stated)      Stay active. Healthy diet.         This is a list of the screening recommended for you and due dates:  Health Maintenance  Topic Date Due   COVID-19 Vaccine (7 - 2023-24 season) 06/30/2022*   Flu Shot  09/20/2022   Medicare Annual Wellness Visit  06/14/2023   DTaP/Tdap/Td vaccine (2 - Tdap) 04/17/2026   Pneumonia Vaccine  Completed   DEXA scan (bone density measurement)  Completed   Zoster (Shingles) Vaccine  Completed   HPV Vaccine  Aged Out  *Topic was postponed. The date shown is not the original due date.    Advanced directives: on file  Conditions/risks identified: none new  Next appointment: Follow up in one year for your annual wellness visit    Preventive Care 65 Years and Older, Female Preventive care refers to lifestyle choices and visits with your health care provider that can promote health and wellness. What does preventive care include? A yearly physical exam. This is also called an annual well check. Dental exams once or twice a year. Routine eye exams. Ask your health care provider how often you should have your eyes checked. Personal lifestyle choices, including: Daily care of your teeth and gums. Regular physical activity. Eating a healthy diet. Avoiding tobacco and drug use. Limiting alcohol use. Practicing safe sex. Taking low-dose aspirin every day. Taking vitamin and mineral supplements as recommended by your health care provider. What happens during an annual well check? The services and screenings done by your health care provider during your annual well check will depend on your age, overall  health, lifestyle risk factors, and family history of disease. Counseling  Your health care provider may ask you questions about your: Alcohol use. Tobacco use. Drug use. Emotional well-being. Home and relationship well-being. Sexual activity. Eating habits. History of falls. Memory and ability to understand (cognition). Work and work Astronomer. Reproductive health. Screening  You may have the following tests or measurements: Height, weight, and BMI. Blood pressure. Lipid and cholesterol levels. These may be checked every 5 years, or more frequently if you are over 58 years old. Skin check. Lung cancer screening. You may have this screening every year starting at age 24 if you have a 30-pack-year history of smoking and currently smoke or have quit within the past 15 years. Fecal occult blood test (FOBT) of the stool. You may have this test every year starting at age 80. Flexible sigmoidoscopy or colonoscopy. You may have a sigmoidoscopy every 5 years or a colonoscopy every 10 years starting at age 51. Hepatitis C blood test. Hepatitis B blood test. Sexually transmitted disease (STD) testing. Diabetes screening. This is done by checking your blood sugar (glucose) after you have not eaten for a while (fasting). You may have this done every 1-3 years. Bone density scan. This is done to screen for osteoporosis. You may have this done starting at age 3. Mammogram. This may be done every 1-2 years. Talk to your health care provider about how often you should have regular mammograms. Talk with your health care  provider about your test results, treatment options, and if necessary, the need for more tests. Vaccines  Your health care provider may recommend certain vaccines, such as: Influenza vaccine. This is recommended every year. Tetanus, diphtheria, and acellular pertussis (Tdap, Td) vaccine. You may need a Td booster every 10 years. Zoster vaccine. You may need this after age  82. Pneumococcal 13-valent conjugate (PCV13) vaccine. One dose is recommended after age 79. Pneumococcal polysaccharide (PPSV23) vaccine. One dose is recommended after age 51. Talk to your health care provider about which screenings and vaccines you need and how often you need them. This information is not intended to replace advice given to you by your health care provider. Make sure you discuss any questions you have with your health care provider. Document Released: 03/04/2015 Document Revised: 10/26/2015 Document Reviewed: 12/07/2014 Elsevier Interactive Patient Education  2017 [place] Prevention in the Home Falls can cause injuries. They can happen to people of all ages. There are many things you can do to make your home safe and to help prevent falls. What can I do on the outside of my home? Regularly fix the edges of walkways and driveways and fix any cracks. Remove anything that might make you trip as you walk through a door, such as a raised step or threshold. Trim any bushes or trees on the path to your home. Use bright outdoor lighting. Clear any walking paths of anything that might make someone trip, such as rocks or tools. Regularly check to see if handrails are loose or broken. Make sure that both sides of any steps have handrails. Any raised decks and porches should have guardrails on the edges. Have any leaves, snow, or ice cleared regularly. Use sand or salt on walking paths during winter. Clean up any spills in your garage right away. This includes oil or grease spills. What can I do in the bathroom? Use night lights. Install grab bars by the toilet and in the tub and shower. Do not use towel bars as grab bars. Use non-skid mats or decals in the tub or shower. If you need to sit down in the shower, use a plastic, non-slip stool. Keep the floor dry. Clean up any water that spills on the floor as soon as it happens. Remove soap buildup in the tub or shower  regularly. Attach bath mats securely with double-sided non-slip rug tape. Do not have throw rugs and other things on the floor that can make you trip. What can I do in the bedroom? Use night lights. Make sure that you have a light by your bed that is easy to reach. Do not use any sheets or blankets that are too big for your bed. They should not hang down onto the floor. Have a firm chair that has side arms. You can use this for support while you get dressed. Do not have throw rugs and other things on the floor that can make you trip. What can I do in the kitchen? Clean up any spills right away. Avoid walking on wet floors. Keep items that you use a lot in easy-to-reach places. If you need to reach something above you, use a strong step stool that has a grab bar. Keep electrical cords out of the way. Do not use floor polish or wax that makes floors slippery. If you must use wax, use non-skid floor wax. Do not have throw rugs and other things on the floor that can make you trip. What can I  do with my stairs? Do not leave any items on the stairs. Make sure that there are handrails on both sides of the stairs and use them. Fix handrails that are broken or loose. Make sure that handrails are as long as the stairways. Check any carpeting to make sure that it is firmly attached to the stairs. Fix any carpet that is loose or worn. Avoid having throw rugs at the top or bottom of the stairs. If you do have throw rugs, attach them to the floor with carpet tape. Make sure that you have a light switch at the top of the stairs and the bottom of the stairs. If you do not have them, ask someone to add them for you. What else can I do to help prevent falls? Wear shoes that: Do not have high heels. Have rubber bottoms. Are comfortable and fit you well. Are closed at the toe. Do not wear sandals. If you use a stepladder: Make sure that it is fully opened. Do not climb a closed stepladder. Make sure that  both sides of the stepladder are locked into place. Ask someone to hold it for you, if possible. Clearly mark and make sure that you can see: Any grab bars or handrails. First and last steps. Where the edge of each step is. Use tools that help you move around (mobility aids) if they are needed. These include: Canes. Walkers. Scooters. Crutches. Turn on the lights when you go into a dark area. Replace any light bulbs as soon as they burn out. Set up your furniture so you have a clear path. Avoid moving your furniture around. If any of your floors are uneven, fix them. If there are any pets around you, be aware of where they are. Review your medicines with your doctor. Some medicines can make you feel dizzy. This can increase your chance of falling. Ask your doctor what other things that you can do to help prevent falls. This information is not intended to replace advice given to you by your health care provider. Make sure you discuss any questions you have with your health care provider. Document Released: 12/02/2008 Document Revised: 07/14/2015 Document Reviewed: 03/12/2014 Elsevier Interactive Patient Education  2017 [employer].

## 2022-06-14 NOTE — Progress Notes (Signed)
Subjective:   Dorothy Church is a 87 y.o. female who presents for Medicare Annual (Subsequent) preventive examination.  Review of Systems    No ROS.  Medicare Wellness Virtual Visit.  Visual/audio telehealth visit, UTA vital signs.   See social history for additional risk factors.   Cardiac Risk Factors include: advanced age (>53men, >68 women)     Objective:    Today's Vitals   06/14/22 1411  Weight: 137 lb (62.1 kg)  Height: 5\' 6"  (1.676 m)   Body mass index is 22.11 kg/m.     06/14/2022    2:14 PM 03/06/2022    9:59 AM 11/07/2021   11:25 AM 05/30/2021   11:30 AM 12/19/2020    7:39 AM 08/30/2020    8:19 AM 05/27/2020   11:16 AM  Advanced Directives  Does Patient Have a Medical Advance Directive? Yes Yes Yes Yes Yes Yes Yes  Type of Estate agent of [place];Living will Healthcare Power of [place];Living will Healthcare Power of [place];Living will Healthcare Power of [place];Living will  Healthcare Power of [place];Living will Healthcare Power of [place];Living will  Does patient want to make changes to medical advance directive? No - Patient declined  No - Patient declined No - Patient declined   No - Patient declined  Copy of Healthcare Power of Attorney in Chart? Yes - validated most recent copy scanned in chart (See row information)  Yes - validated most recent copy scanned in chart (See row information) Yes - validated most recent copy scanned in chart (See row information)  No - copy requested Yes - validated most recent copy scanned in chart (See row information)    Current Medications (verified) Outpatient Encounter Medications as of 06/14/2022  Medication Sig   acetaminophen (TYLENOL) 500 MG tablet Take 1,000 mg by mouth every 8 (eight) hours as needed (PAIN).   albuterol (VENTOLIN HFA) 108 (90 Base) MCG/ACT inhaler Inhale 2 puffs into the lungs every 6 (six) hours as needed for wheezing.   aspirin EC 81 MG tablet Take 1 tablet (81 mg  total) by mouth daily. Swallow whole.   calcium carbonate (TUMS - DOSED IN MG ELEMENTAL CALCIUM) 500 MG chewable tablet Chew 1 tablet by mouth 4 (four) times daily as needed for indigestion or heartburn.   Cholecalciferol (VITAMIN D3) 50 MCG (2000 UT) capsule Take 1 capsule (2,000 Units total) by mouth daily.   fluticasone (FLONASE) 50 MCG/ACT nasal spray Place into both nostrils daily.   latanoprost (XALATAN) 0.005 % ophthalmic solution 1 drop at bedtime.   levothyroxine (SYNTHROID) 50 MCG tablet TAKE 1 TABLET BY MOUTH EVERY DAY BEFORE BREAKFAST   loratadine (CLARITIN) 10 MG tablet TAKE 1 TABLET BY MOUTH EVERY DAY AS NEEDED FOR ALLERGY   pantoprazole (PROTONIX) 40 MG tablet TAKE 1 TABLET BY MOUTH EVERY DAY   PARoxetine (PAXIL) 30 MG tablet Take 1 tablet (30 mg total) by mouth daily.   simvastatin (ZOCOR) 40 MG tablet Take 40 mg by mouth daily.   SYMBICORT 160-4.5 MCG/ACT inhaler INHALE 2 PUFFS INTO THE LUNGS TWICE A DAY   traZODone (DESYREL) 100 MG tablet Take 1 tablet (100 mg total) by mouth at bedtime.   No facility-administered encounter medications on file as of 06/14/2022.    Allergies (verified) Amoxicillin, Cefzil [cefprozil], Cephalexin, Propoxyphene, Relafen [nabumetone], Talwin [pentazocine], and Penicillins   History: Past Medical History:  Diagnosis Date   Abdominal pain    Abnormal CT of liver    Accident due to mechanical fall  without injury 01/28/2022   Acute pain of left shoulder 09/28/2020   Anxiety    Anxiety 04/28/2020   Aortic stenosis    Aortic stenosis 03/04/2012   Overview:   Not confirmed by Dr Mariah Milling.    Aortic valve disorders    Arthritis    Asthma    Asthma 08/07/2012   Barrett's esophagus    [facility] last EGD 02/2018 Dr. Marva Panda    Barrett's esophagus 03/11/2017   Overview:  Dr Marva Panda   Carotid artery stenosis 12/30/2018   1-39% b/l carotids       Colon polyps    Colon polyps    COPD (chronic obstructive pulmonary disease)    DDD (degenerative  disc disease), cervical    DDD (degenerative disc disease), cervical 04/28/2020   DDD (degenerative disc disease), lumbar 01/22/2017   Depressive disorder    Diverticulosis    Erosive esophagitis    Erosive esophagitis 04/28/2020   Fundic gland polyps of stomach, benign    Gastric polyp    Gastric polyps    Gastritis    Generalized osteoarthritis of hand 05/03/2015   GERD (gastroesophageal reflux disease)    GOA (generalized osteoarthritis) 11/23/2015   Goiter    Hiatal hernia    small    Hiatal hernia    History of colonic polyps 12/08/2013   Hypercholesteremia    Hypothyroidism    Impaired fasting glucose    Impaired fasting glucose 03/04/2012   Overview:   Glucose 111 02/2012   Intrinsic asthma, unspecified    Joint pain of ankle and foot, right 05/17/2015   Left rib fracture    Mechanical complication of knee prosthesis, initial encounter 08/25/2017   Obstructive sleep apnea (adult) (pediatric)    used cpap x 10 years per Dr. Belia Heman off as of 12/30/18    OSA (obstructive sleep apnea)    used cpap x 10 years per Dr. 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   Stress 04/28/2020   Syncope and collapse    TIA (transient ischemic attack)    TIA (transient ischemic attack) 12/11/2016   TIA (transient ischemic attack) 12/11/2016   UTI (urinary tract infection)    Vitamin D deficiency    Past Surgical History:  Procedure Laterality Date   BREAST EXCISIONAL BIOPSY Left    BREAST SURGERY     CATARACT EXTRACTION W/PHACO Right 11/07/2021   Procedure: CATARACT EXTRACTION PHACO AND INTRAOCULAR LENS PLACEMENT (IOC) RIGHT  8.87 00:50.8;  Surgeon: Galen Manila, MD;  Location: [facility];  Service: Ophthalmology;  Laterality: Right;   COLONOSCOPY     COLONOSCOPY WITH ESOPHAGOGASTRODUODENOSCOPY (EGD)     COLONOSCOPY WITH PROPOFOL N/A 12/14/2014   Procedure: COLONOSCOPY WITH PROPOFOL;  Surgeon: Christena Deem, MD;  Location: [facility] ENDOSCOPY;  Service: Endoscopy;  Laterality: N/A;   COLONOSCOPY WITH PROPOFOL N/A 03/18/2018   Procedure: COLONOSCOPY WITH PROPOFOL;  Surgeon: Christena Deem, MD;  Location: [facility] ENDOSCOPY;  Service: Endoscopy;  Laterality: N/A;   ENUCLEATION     ESOPHAGOGASTRODUODENOSCOPY N/A 08/30/2020   Procedure: ESOPHAGOGASTRODUODENOSCOPY (EGD);  Surgeon: Regis Bill, MD;  Location: [facility] ENDOSCOPY;  Service: Endoscopy;  Laterality: N/A;  ESOPHAGOGASTRODUODENOSCOPY (EGD) WITH PROPOFOL N/A 12/14/2014   Procedure: ESOPHAGOGASTRODUODENOSCOPY (EGD) WITH PROPOFOL;  Surgeon: Christena Deem, MD;  Location: [facility] ENDOSCOPY;  Service: Endoscopy;  Laterality: N/A;   ESOPHAGOGASTRODUODENOSCOPY (EGD) WITH PROPOFOL N/A 10/18/2016   Procedure: ESOPHAGOGASTRODUODENOSCOPY (EGD) WITH PROPOFOL;  Surgeon: Christena Deem, MD;  Location: [facility] ENDOSCOPY;  Service: Endoscopy;  Laterality: N/A;   ESOPHAGOGASTRODUODENOSCOPY (EGD) WITH PROPOFOL N/A 03/18/2018   Procedure: ESOPHAGOGASTRODUODENOSCOPY (EGD) WITH PROPOFOL;  Surgeon: Christena Deem, MD;  Location: [facility] ENDOSCOPY;  Service: Endoscopy;  Laterality: N/A;   ESOPHAGOGASTRODUODENOSCOPY (EGD) WITH PROPOFOL N/A 12/19/2020   Procedure: ESOPHAGOGASTRODUODENOSCOPY (EGD) WITH PROPOFOL;  Surgeon: Regis Bill, MD;  Location: [facility] ENDOSCOPY;  Service: Endoscopy;  Laterality: N/A;   EYE SURGERY     FRACTURE SURGERY     HIP SURGERY Left    pin and plate   IR ANGIO INTRA EXTRACRAN SEL COM CAROTID INNOMINATE BILAT MOD SED  10/17/2018   IR ANGIO VERTEBRAL SEL VERTEBRAL BILAT MOD SED  10/17/2018   IR ANGIOGRAM EXTREMITY LEFT  10/17/2018   JOINT REPLACEMENT     Left Total Knee Arthroplasty     Dr Ernest Pine and  also had 39 years ago from 2020    nasal polyps     removed x 2 affects sense of smell x 10-15 years from 04/28/20   prosthesis eye implant     REPLACEMENT UNICONDYLAR JOINT KNEE Left    TONSILLECTOMY     TOTAL KNEE REVISION Left 09/13/2017   Procedure: TOTAL KNEE REVISION;  Surgeon: Donato Heinz, MD;  Location: [facility] ORS;  Service: Orthopedics;  Laterality: Left;   Family History  Problem Relation Age of Onset   Breast cancer Mother        pagets dx 55s   Heart disease Father    Alcohol abuse Father    Dementia Sister        twin sister 91 dx'ed dementia   Bipolar disorder Brother    Social History   Socioeconomic History   Marital status: Divorced    Spouse name: Not on file   Number of children: Not on file   Years of education: Not on file   Highest education level: Not on file  Occupational History   Not on file  Tobacco Use   Smoking status: Never   Smokeless tobacco: Never  Vaping Use   Vaping Use: Never used  Substance and Sexual Activity   Alcohol use: Yes    Alcohol/week: 1.0 standard drink of alcohol    Types: 1 Glasses of wine per week    Comment: occas. wine   Drug use: No   Sexual activity: Never  Other Topics Concern   Not on file  Social History Narrative   Has twin sister identical lives in [place] does not live with her    Lived in [place], moved to [place] two years ago.   Divorced.      Has a living will.   Would desire CPR, would not desire extraordinary measures.      1 boy and 1 girl daughter [place], son In [place], [place] hes Technical sales engineer   Social Determinants of Health   Financial Resource Strain: Low Risk  (06/14/2022)   Overall Financial Resource Strain (CARDIA)    Difficulty of Paying Living Expenses: Not very hard  Food Insecurity: No Food Insecurity (06/14/2022)   Hunger Vital Sign    Worried About Running Out of Food in the Last Year: Never true  Ran Out of Food in the Last Year: Never true   Transportation Needs: No Transportation Needs (06/14/2022)   PRAPARE - Administrator, Civil Service (Medical): No    Lack of Transportation (Non-Medical): No  Physical Activity: Insufficiently Active (06/14/2022)   Exercise Vital Sign    Days of Exercise per Week: 5 days    Minutes of Exercise per Session: 20 min  Stress: No Stress Concern Present (06/14/2022)   [employer] of Occupational Health - Occupational Stress Questionnaire    Feeling of Stress : Only a little  Social Connections: Unknown (06/14/2022)   Social Connection and Isolation Panel [NHANES]    Frequency of Communication with Friends and Family: More than three times a week    Frequency of Social Gatherings with Friends and Family: More than three times a week    Attends Religious Services: Not on Marketing executive or Organizations: Not on file    Attends Banker Meetings: Not on file    Marital Status: Not on file    Tobacco Counseling Counseling given: Not Answered   Clinical Intake:  Pre-visit preparation completed: Yes        Diabetes: No  How often do you need to have someone help you when you read instructions, pamphlets, or other written materials from your doctor or pharmacy?: 1 - Never   Interpreter Needed?: No      Activities of Daily Living    06/14/2022    2:15 PM 11/07/2021   11:17 AM  In your present state of health, do you have any difficulty performing the following activities:  Hearing? 1 0  Comment Hearing aids   Vision? 0 0  Difficulty concentrating or making decisions? 0 0  Walking or climbing stairs? 0 0  Dressing or bathing? 0 0  Doing errands, shopping? 0   Preparing Food and eating ? N   Using the Toilet? N   In the past six months, have you accidently leaked urine? N   Do you have problems with loss of bowel control? N   Managing your Medications? N   Managing your Finances? N   Housekeeping or managing your Housekeeping? N      Patient Care Team: Dana Allan, MD as PCP - General (Family Medicine) Stephanie Acre, MD (Inactive) as Consulting Physician (Internal Medicine) Emi Belfast, FNP (Nurse Practitioner)  Indicate any recent Medical Services you may have received from other than [facility] providers in the past year (date may be approximate).     Assessment:   This is a routine wellness examination for Callista.  I connected with  Serita Butcher on 06/14/22 by a audio enabled telemedicine application and verified that I am speaking with the correct person using two identifiers.  Patient Location: Home  Provider Location: Office/Clinic  I discussed the limitations of evaluation and management by telemedicine. The patient expressed understanding and agreed to proceed.   Hearing/Vision screen Hearing Screening - Comments:: Hearing aid, bilateral  Vision Screening - Comments:: Followed by [facility] Wears corrective lenses They have seen their ophthalmologist in the last 12 months   Dietary issues and exercise activities discussed: Current Exercise Habits: Home exercise routine, Time (Minutes): 20, Frequency (Times/Week): 4, Weekly Exercise (Minutes/Week): 80, Intensity: Mild   Goals Addressed               This Visit's Progress     Patient Stated     Maintain Healthy  Lifestyle (pt-stated)        Stay active. Healthy diet.        Depression Screen    06/14/2022    2:14 PM 03/12/2022   11:45 AM 02/05/2022   11:39 AM 10/17/2021    1:08 PM 05/30/2021   11:28 AM 09/28/2020    2:02 PM 05/27/2020   11:14 AM  PHQ 2/9 Scores  PHQ - 2 Score 0 0 0 0 0 0 0  PHQ- 9 Score  1 4   0     Fall Risk    06/14/2022    2:14 PM 03/12/2022   11:44 AM 02/05/2022   11:39 AM 02/05/2022   11:38 AM 01/17/2022    2:31 PM  Fall Risk   Falls in the past year? 0 0 0 0 1  Number falls in past yr: 0 0  0 0  Injury with Fall? 0 0  0 1  Risk for fall due to :  No Fall Risks No Fall Risks No Fall  Risks No Fall Risks  Follow up Falls evaluation completed;Falls prevention discussed Falls evaluation completed Falls evaluation completed Falls evaluation completed Falls evaluation completed    FALL RISK PREVENTION PERTAINING TO THE HOME: Home free of loose throw rugs in walkways, pet beds, electrical cords, etc? Yes  Adequate lighting in your home to reduce risk of falls? Yes   ASSISTIVE DEVICES UTILIZED TO PREVENT FALLS: Life alert? Yes  Use of a cane, walker or w/c? No  Grab bars in the bathroom? Yes  Shower chair or bench in shower? Yes  Elevated toilet seat or a handicapped toilet? Yes   TIMED UP AND GO: Was the test performed? No .   Cognitive Function:    04/28/2018   12:19 PM 10/24/2016   11:38 AM  MMSE - Mini Mental State Exam  Orientation to time 5 5  Orientation to Place 5 5  Registration 3 3  Attention/ Calculation 0 0  Recall 3 3  Language- name 2 objects 0 0  Language- repeat 1 1  Language- follow 3 step command 3 3  Language- read & follow direction 0 0  Write a sentence 0 0  Copy design 0 0  Total score 20 20        06/14/2022    2:17 PM 05/27/2019   11:20 AM  6CIT Screen  What Year? 0 points 0 points  What month? 0 points 0 points  What time? 0 points 0 points  Count back from 20 0 points 0 points  Months in reverse 0 points 0 points  Repeat phrase 0 points 0 points  Total Score 0 points 0 points    Immunizations Immunization History  Administered Date(s) Administered   COVID-19, mRNA, vaccine(Comirnaty)12 years and older 12/01/2021   Fluad Quad(high Dose 65+) 12/05/2019, 11/04/2020   Influenza Whole 12/14/2010   Influenza, High Dose Seasonal PF 12/02/2017, 12/08/2018   Influenza,inj,Quad PF,6+ Mos 11/10/2012, 11/05/2013, 11/10/2014, 10/17/2015, 10/24/2016, 10/17/2021   Influenza-Unspecified 12/04/2011, 11/10/2012, 11/05/2013, 12/07/2018   Moderna Sars-Covid-2 Vaccination 03/03/2019, 03/31/2019, 11/30/2019, 12/26/2019, 07/07/2020    Pneumococcal Conjugate-13 10/07/2009, 04/23/2013, 11/24/2013   Pneumococcal Polysaccharide-23 11/13/2009, 12/19/2018   Td 04/17/2016   Zoster Recombinat (Shingrix) 04/09/2018, 09/15/2018   Zoster, Live 05/08/2007   Covid-19 vaccine status: Completed vaccines x6.  Screening Tests Health Maintenance  Topic Date Due   COVID-19 Vaccine (7 - 2023-24 season) 06/30/2022 (Originally 01/26/2022)   INFLUENZA VACCINE  09/20/2022   Medicare Annual Wellness (AWV)  06/14/2023  DTaP/Tdap/Td (2 - Tdap) 04/17/2026   Pneumonia Vaccine 15+ Years old  Completed   DEXA SCAN  Completed   Zoster Vaccines- Shingrix  Completed   HPV VACCINES  Aged Out    Health Maintenance  There are no preventive care reminders to display for this patient.  Lung Cancer Screening: (Low Dose CT Chest recommended if Age 73-80 years, 30 pack-year currently smoking OR have quit w/in 15years.) does not qualify.   Hepatitis C Screening: does not qualify.  Vision Screening: Recommended annual ophthalmology exams for early detection of glaucoma and other disorders of the eye.  Dental Screening: Recommended annual dental exams for proper oral hygiene  Community Resource Referral / Chronic Care Management: CRR required this visit?  No   CCM required this visit?  No      Plan:     I have personally reviewed and noted the following in the patient's chart:   Medical and social history Use of alcohol, tobacco or illicit drugs  Current medications and supplements including opioid prescriptions. Patient is not currently taking opioid prescriptions. Functional ability and status Nutritional status Physical activity Advanced directives List of other physicians Hospitalizations, surgeries, and ER visits in previous 12 months Vitals Screenings to include cognitive, depression, and falls Referrals and appointments  In addition, I have reviewed and discussed with patient certain preventive protocols, quality metrics, and  best practice recommendations. A written personalized care plan for preventive services as well as general preventive health recommendations were provided to patient.     Cathey Endow, LPN   5/62/1308

## 2022-06-17 ENCOUNTER — Encounter: Payer: Self-pay | Admitting: Family Medicine

## 2022-06-17 DIAGNOSIS — R7301 Impaired fasting glucose: Secondary | ICD-10-CM | POA: Insufficient documentation

## 2022-06-17 NOTE — Assessment & Plan Note (Addendum)
Chronic.  Try to wean off Paxil but was unsuccessful in back to 30 mg daily.  Was previously on 60 mg years ago. Continue Paxil 30 mg daily Encouraged CBT Long discussion surrounding stress management and techniques Offered psychiatry consult, patient politely declined at this time. Will check labs to ensure no underlying etiology Follow-up as needed

## 2022-06-17 NOTE — Assessment & Plan Note (Addendum)
Chronic.  Now has difficulties staying asleep.  Has not tried ramelteon as not covered under insurance.  Trazodone has increased from 25 mg to 50 mg. -Continue sleep hygiene -Increase trazodone to 75 mg nightly -Follow-up in 3 months

## 2022-07-10 ENCOUNTER — Encounter: Payer: Self-pay | Admitting: Family Medicine

## 2022-07-10 DIAGNOSIS — I739 Peripheral vascular disease, unspecified: Secondary | ICD-10-CM | POA: Diagnosis not present

## 2022-07-10 DIAGNOSIS — M1711 Unilateral primary osteoarthritis, right knee: Secondary | ICD-10-CM | POA: Diagnosis not present

## 2022-08-03 ENCOUNTER — Other Ambulatory Visit: Payer: Self-pay | Admitting: Podiatry

## 2022-08-09 ENCOUNTER — Encounter: Payer: Self-pay | Admitting: Family Medicine

## 2022-09-05 DIAGNOSIS — M1711 Unilateral primary osteoarthritis, right knee: Secondary | ICD-10-CM | POA: Diagnosis not present

## 2022-09-10 ENCOUNTER — Ambulatory Visit: Payer: Medicare HMO | Admitting: Family Medicine

## 2022-09-10 VITALS — BP 104/60 | HR 83 | Temp 97.9°F | Ht 66.0 in | Wt 138.0 lb

## 2022-09-10 DIAGNOSIS — F339 Major depressive disorder, recurrent, unspecified: Secondary | ICD-10-CM | POA: Diagnosis not present

## 2022-09-10 DIAGNOSIS — G4700 Insomnia, unspecified: Secondary | ICD-10-CM | POA: Diagnosis not present

## 2022-09-10 MED ORDER — ZALEPLON 5 MG PO CAPS: 5.0000 mg | ORAL_CAPSULE | Freq: Every evening | ORAL | 0 refills | Status: DC | PRN

## 2022-09-10 NOTE — Progress Notes (Signed)
   SUBJECTIVE:   Chief Complaint  Patient presents with   Follow-up    Follow up. Pt states she's been having bad jitters and can't keep her hands and legs still    HPI Patient presents to clinic to discuss mood disorder and medication.  Reports sleeping well taking trazodone 100 mg at night.  Endorses increased jittering and anxiety.  Reports no resting tremors, shuffling gait, incontinence of urine or headaches.  Bilateral hand tremor worse with intentional use, writing, attempting to drinking coffee.  Does not interfere with daily activities but has become bothersome at times.   Has now been on Paxil 30 mg daily and would like to increase dose.  Reports having been on higher dose in past, up to 60 mg.  She lives alone, recently lost her pet dog and has increased worrying about grandchild and son.  PERTINENT PMH / PSH Mood disorder Sleep disorder   OBJECTIVE:  BP 104/60 (BP Location: Left Arm, Patient Position: Sitting, Cuff Size: Normal)   Pulse 83   Temp 97.9 F (36.6 C) (Oral)   Ht 5\' 6"  (1.676 m)   Wt 138 lb (62.6 kg)   SpO2 95%   BMI 22.27 kg/m    Physical Exam Vitals reviewed.  Constitutional:      General: She is not in acute distress.    Appearance: Normal appearance. She is normal weight. She is not ill-appearing, toxic-appearing or diaphoretic.  Eyes:     General:        Right eye: No discharge.        Left eye: No discharge.     Conjunctiva/sclera: Conjunctivae normal.  Cardiovascular:     Rate and Rhythm: Normal rate.  Pulmonary:     Effort: Pulmonary effort is normal.  Musculoskeletal:        General: Normal range of motion.  Skin:    General: Skin is warm and dry.  Neurological:     General: No focal deficit present.     Mental Status: She is alert and oriented to person, place, and time. Mental status is at baseline.  Psychiatric:        Mood and Affect: Mood normal.        Behavior: Behavior normal.        Thought Content: Thought content  normal.        Judgment: Judgment normal.     ASSESSMENT/PLAN:  Insomnia, unspecified type Assessment & Plan: Chronic.  Sleeping well Has been taking Trazodone 100 mg at night.  Suspect may be contributing to increase in anxiety at higher dose Discontinue Trazodone Start Sonata 5 mg at night Follow up if no improvement in symptoms If jittering does not improve could consider treating for essential tremor    Depression, recurrent (HCC) Assessment & Plan: Chronic.   Continue Paxil 30 mg daily Encouraged CBT Follow-up as needed    Other orders -     Zaleplon; Take 1 capsule (5 mg total) by mouth at bedtime as needed for sleep.  Dispense: 30 capsule; Refill: 0    PDMP reviewed  Return in 4 weeks (on 10/08/2022), or if symptoms worsen or fail to improve, for PCP.  Dana Allan, MD

## 2022-09-10 NOTE — Patient Instructions (Addendum)
It was a pleasure meeting you today. Thank you for allowing me to take part in your health care.  Our goals for today as we discussed include:  Stop Trazodone Start Sonata 5 mg at night as needed  Continue Paxil 30 mg daily  Follow up if  symptoms worsen or as needed  If you have any questions or concerns, please do not hesitate to call the office at [phone].  I look forward to our next visit and until then take care and stay safe.  Regards,   Dana Allan, MD   [facility]

## 2022-09-19 ENCOUNTER — Encounter (INDEPENDENT_AMBULATORY_CARE_PROVIDER_SITE_OTHER): Payer: Self-pay

## 2022-09-19 DIAGNOSIS — M1711 Unilateral primary osteoarthritis, right knee: Secondary | ICD-10-CM | POA: Diagnosis not present

## 2022-09-22 ENCOUNTER — Encounter: Payer: Self-pay | Admitting: Family Medicine

## 2022-09-22 NOTE — Assessment & Plan Note (Signed)
Chronic.   Continue Paxil 30 mg daily Encouraged CBT Follow-up as needed

## 2022-09-22 NOTE — Assessment & Plan Note (Signed)
Chronic.  Sleeping well Has been taking Trazodone 100 mg at night.  Suspect may be contributing to increase in anxiety at higher dose Discontinue Trazodone Start Sonata 5 mg at night Follow up if no improvement in symptoms If jittering does not improve could consider treating for essential tremor

## 2022-09-26 DIAGNOSIS — M1711 Unilateral primary osteoarthritis, right knee: Secondary | ICD-10-CM | POA: Diagnosis not present

## 2022-10-03 DIAGNOSIS — M1711 Unilateral primary osteoarthritis, right knee: Secondary | ICD-10-CM | POA: Diagnosis not present

## 2022-10-05 ENCOUNTER — Other Ambulatory Visit: Payer: Self-pay | Admitting: Family Medicine

## 2022-10-05 NOTE — Telephone Encounter (Signed)
Refilled: 09/10/2022 Last OV: 09/10/2022 Next OV: 10/08/2022

## 2022-10-08 ENCOUNTER — Ambulatory Visit: Payer: Medicare HMO | Admitting: Family Medicine

## 2022-10-08 ENCOUNTER — Encounter: Payer: Self-pay | Admitting: Family Medicine

## 2022-10-08 VITALS — BP 138/74 | HR 89 | Temp 97.5°F | Resp 16 | Ht 66.0 in | Wt 138.4 lb

## 2022-10-08 DIAGNOSIS — F39 Unspecified mood [affective] disorder: Secondary | ICD-10-CM | POA: Diagnosis not present

## 2022-10-08 DIAGNOSIS — G4700 Insomnia, unspecified: Secondary | ICD-10-CM

## 2022-10-08 MED ORDER — PAROXETINE HCL 40 MG PO TABS
40.0000 mg | ORAL_TABLET | ORAL | 0 refills | Status: DC
Start: 2022-10-08 — End: 2022-11-01

## 2022-10-08 NOTE — Progress Notes (Signed)
SUBJECTIVE:   Chief Complaint  Patient presents with   Medical Management of Chronic Issues   HPI Patient presents to clinic to discuss mood disorder and medication.  Mood Disorder/Sleeping disorder Switched from Trazodone to [employer].  Sleeping well.  Continues to have some bilateral hand tremors but has improved since discontinuing Trazodone.   Reports no resting tremors, shuffling gait, incontinence of urine or headaches.  Bilateral hand tremor worse with intentional use, writing, attempting to drinking coffee.  Does not interfere with daily activities but has become bothersome at times.   Has now been on Paxil 30 mg daily and would like to increase dose.  Reports having been on higher dose in past, up to 60 mg.  She lives alone, recently lost her pet dog and has increased worrying about grandchild and son.  PERTINENT PMH / PSH Mood disorder Sleep disorder   OBJECTIVE:  BP 138/74   Pulse 89   Temp (!) 97.5 F (36.4 C)   Resp 16   Ht 5\' 6"  (1.676 m)   Wt 138 lb 6 oz (62.8 kg)   SpO2 94%   BMI 22.33 kg/m    Physical Exam Vitals reviewed.  Constitutional:      General: She is not in acute distress.    Appearance: Normal appearance. She is normal weight. She is not ill-appearing, toxic-appearing or diaphoretic.  Eyes:     General:        Right eye: No discharge.        Left eye: No discharge.     Conjunctiva/sclera: Conjunctivae normal.  Cardiovascular:     Rate and Rhythm: Normal rate.  Pulmonary:     Effort: Pulmonary effort is normal.  Musculoskeletal:        General: Normal range of motion.  Skin:    General: Skin is warm and dry.  Neurological:     General: No focal deficit present.     Mental Status: She is alert and oriented to person, place, and time. Mental status is at baseline.  Psychiatric:        Mood and Affect: Mood normal.        Behavior: Behavior normal.        Thought Content: Thought content normal.        Judgment: Judgment normal.        10/08/2022    2:50 PM 09/10/2022    1:05 PM 06/14/2022    2:14 PM 03/12/2022   11:45 AM 02/05/2022   11:39 AM  Depression screen PHQ 2/9  Decreased Interest 1 1 0 0 0  Down, Depressed, Hopeless 1 0 0 0 0  PHQ - 2 Score 2 1 0 0 0  Altered sleeping 1   1 2   Tired, decreased energy 1   0 0  Change in appetite 1   0 2  Feeling bad or failure about yourself  1   0 0  Trouble concentrating 1   0 0  Moving slowly or fidgety/restless 1   0 0  Suicidal thoughts 1   0 0  PHQ-9 Score 9   1 4   Difficult doing work/chores Somewhat difficult   Not difficult at all Somewhat difficult        10/08/2022    2:51 PM 09/10/2022    1:06 PM 03/12/2022   11:45 AM 02/05/2022   11:40 AM  GAD 7 : Generalized Anxiety Score  Nervous, Anxious, on Edge 1 2 0 1  Control/stop worrying 0 1  0 0  Worry too much - different things 0 1 0 0  Trouble relaxing 2 2 0 1  Restless 2 2 0 0  Easily annoyed or irritable 1 0 0 0  Afraid - awful might happen 1 0 0 0  Total GAD 7 Score 7 8 0 2  Anxiety Difficulty Somewhat difficult Somewhat difficult Not difficult at all Somewhat difficult      ASSESSMENT/PLAN:  Mood disorder [facility]) Assessment & Plan: Chronic.   Increase Paxil 40 mg daily Encouraged CBT Psychiatry referral offered, patient politely declined. Follow-up in 4 weeks   Orders: -     PARoxetine HCl; Take 1 tablet (40 mg total) by mouth every morning.  Dispense: 30 tablet; Refill: 0  Insomnia, unspecified type Assessment & Plan: Chronic.  Improved with change in medication.  Continue Sonata 5 mg at night Jittering has improved since discontinuing Trazodone. Will continue to monitor, if no improvement at next visit consider BB or Neurology referral.       PDMP reviewed  Return in about 4 weeks (around 11/05/2022) for PCP.  Dana Allan, MD

## 2022-10-08 NOTE — Patient Instructions (Signed)
It was a pleasure meeting you today. Thank you for allowing me to take part in your health care.  Our goals for today as we discussed include:  Increase Paxil to 40 mg daily Continue Lunesta at night.  Prescription was sent 08/16.  Call pharmacy for when can get refilled  MyChart me in 1 week to notify of any side effects of increasing medication.  Follow up in 4 weeks   If you have any questions or concerns, please do not hesitate to call the office at [phone].  I look forward to our next visit and until then take care and stay safe.  Regards,   Dana Allan, MD   [facility]

## 2022-10-10 DIAGNOSIS — M1711 Unilateral primary osteoarthritis, right knee: Secondary | ICD-10-CM | POA: Diagnosis not present

## 2022-10-17 DIAGNOSIS — M1711 Unilateral primary osteoarthritis, right knee: Secondary | ICD-10-CM | POA: Diagnosis not present

## 2022-10-23 NOTE — Assessment & Plan Note (Addendum)
Chronic.   Increase Paxil 40 mg daily Encouraged CBT Psychiatry referral offered, patient politely declined. Follow-up in 4 weeks

## 2022-10-24 ENCOUNTER — Encounter: Payer: Self-pay | Admitting: Family Medicine

## 2022-10-24 DIAGNOSIS — M1711 Unilateral primary osteoarthritis, right knee: Secondary | ICD-10-CM | POA: Diagnosis not present

## 2022-10-24 NOTE — Assessment & Plan Note (Signed)
Chronic.  Improved with change in medication.  Continue Sonata 5 mg at night Jittering has improved since discontinuing Trazodone. Will continue to monitor, if no improvement at next visit consider BB or Neurology referral.

## 2022-11-01 ENCOUNTER — Other Ambulatory Visit: Payer: Self-pay | Admitting: Family Medicine

## 2022-11-01 DIAGNOSIS — H26491 Other secondary cataract, right eye: Secondary | ICD-10-CM | POA: Diagnosis not present

## 2022-11-01 DIAGNOSIS — H43811 Vitreous degeneration, right eye: Secondary | ICD-10-CM | POA: Diagnosis not present

## 2022-11-01 DIAGNOSIS — H401110 Primary open-angle glaucoma, right eye, stage unspecified: Secondary | ICD-10-CM | POA: Diagnosis not present

## 2022-11-01 DIAGNOSIS — F39 Unspecified mood [affective] disorder: Secondary | ICD-10-CM

## 2022-11-09 ENCOUNTER — Telehealth: Payer: Self-pay | Admitting: Family Medicine

## 2022-11-09 ENCOUNTER — Other Ambulatory Visit: Payer: Self-pay | Admitting: Family Medicine

## 2022-11-09 NOTE — Telephone Encounter (Signed)
Prescription Request  11/09/2022  LOV: 10/08/2022  What is the name of the medication or equipment? zaleplon (SONATA) 5 MG capsule   Have you contacted your pharmacy to request a refill? Yes   Which pharmacy would you like this sent to?   [id number] [address] [place] [address] Phone: [phone] Fax: [phone]      Patient notified that their request is being sent to the clinical staff for review and that they should receive a response within 2 business days.   Please advise at [facility] [phone]

## 2022-11-09 NOTE — Telephone Encounter (Signed)
Rx request sent to provider

## 2022-11-21 ENCOUNTER — Other Ambulatory Visit: Payer: Self-pay | Admitting: Podiatry

## 2022-11-21 ENCOUNTER — Encounter: Payer: Self-pay | Admitting: Family Medicine

## 2022-11-21 DIAGNOSIS — E039 Hypothyroidism, unspecified: Secondary | ICD-10-CM

## 2022-11-22 ENCOUNTER — Other Ambulatory Visit: Payer: Self-pay

## 2022-11-22 DIAGNOSIS — E039 Hypothyroidism, unspecified: Secondary | ICD-10-CM

## 2022-11-22 MED ORDER — LEVOTHYROXINE SODIUM 50 MCG PO TABS
ORAL_TABLET | ORAL | 1 refills | Status: DC
Start: 2022-11-22 — End: 2023-03-08

## 2022-12-06 DIAGNOSIS — E559 Vitamin D deficiency, unspecified: Secondary | ICD-10-CM | POA: Diagnosis not present

## 2022-12-08 ENCOUNTER — Other Ambulatory Visit: Payer: Self-pay | Admitting: Family Medicine

## 2022-12-10 ENCOUNTER — Other Ambulatory Visit: Payer: Self-pay | Admitting: Family Medicine

## 2022-12-11 ENCOUNTER — Ambulatory Visit: Payer: Medicare HMO | Admitting: Student in an Organized Health Care Education/Training Program

## 2022-12-12 ENCOUNTER — Ambulatory Visit: Payer: Medicare HMO | Admitting: Pulmonary Disease

## 2022-12-12 ENCOUNTER — Other Ambulatory Visit
Admission: RE | Admit: 2022-12-12 | Discharge: 2022-12-12 | Disposition: A | Discharge status: Home / Self Care | Payer: Medicare HMO | Source: Ambulatory Visit | Attending: Pulmonary Disease | Admitting: Pulmonary Disease

## 2022-12-12 ENCOUNTER — Encounter: Payer: Self-pay | Admitting: Pulmonary Disease

## 2022-12-12 ENCOUNTER — Ambulatory Visit
Admission: RE | Admit: 2022-12-12 | Discharge: 2022-12-12 | Disposition: A | Discharge status: Home / Self Care | Payer: Medicare HMO | Source: Ambulatory Visit | Attending: Pulmonary Disease | Admitting: Pulmonary Disease

## 2022-12-12 ENCOUNTER — Encounter: Payer: Self-pay | Admitting: Family Medicine

## 2022-12-12 VITALS — BP 122/70 | HR 91 | Temp 97.1°F | Ht 66.0 in | Wt 143.4 lb

## 2022-12-12 DIAGNOSIS — J4540 Moderate persistent asthma, uncomplicated: Secondary | ICD-10-CM

## 2022-12-12 DIAGNOSIS — R0602 Shortness of breath: Secondary | ICD-10-CM | POA: Diagnosis not present

## 2022-12-12 DIAGNOSIS — J449 Chronic obstructive pulmonary disease, unspecified: Secondary | ICD-10-CM | POA: Diagnosis not present

## 2022-12-12 DIAGNOSIS — R918 Other nonspecific abnormal finding of lung field: Secondary | ICD-10-CM | POA: Diagnosis not present

## 2022-12-12 DIAGNOSIS — I700 Atherosclerosis of aorta: Secondary | ICD-10-CM | POA: Diagnosis not present

## 2022-12-12 LAB — CBC WITH DIFFERENTIAL/PLATELET
Abs Immature Granulocytes: 0.03 10*3/uL (ref 0.00–0.07)
Basophils Absolute: 0 10*3/uL (ref 0.0–0.1)
Basophils Relative: 1 %
Eosinophils Absolute: 0.2 10*3/uL (ref 0.0–0.5)
Eosinophils Relative: 4 %
HCT: 40.9 % (ref 36.0–46.0)
Hemoglobin: 13.7 g/dL (ref 12.0–15.0)
Immature Granulocytes: 1 %
Lymphocytes Relative: 18 %
Lymphs Abs: 1 10*3/uL (ref 0.7–4.0)
MCH: 31.1 pg (ref 26.0–34.0)
MCHC: 33.5 g/dL (ref 30.0–36.0)
MCV: 92.7 fL (ref 80.0–100.0)
Monocytes Absolute: 0.5 10*3/uL (ref 0.1–1.0)
Monocytes Relative: 9 %
Neutro Abs: 3.5 10*3/uL (ref 1.7–7.7)
Neutrophils Relative %: 67 %
Platelets: 216 10*3/uL (ref 150–400)
RBC: 4.41 MIL/uL (ref 3.87–5.11)
RDW: 12.1 % (ref 11.5–15.5)
WBC: 5.2 10*3/uL (ref 4.0–10.5)
nRBC: 0 % (ref 0.0–0.2)

## 2022-12-12 LAB — NITRIC OXIDE: Nitric Oxide: 15

## 2022-12-12 MED ORDER — BREZTRI AEROSPHERE 160-9-4.8 MCG/ACT IN AERO: 2.0000 | INHALATION_SPRAY | Freq: Two times a day (BID) | RESPIRATORY_TRACT | 0 refills | Status: DC

## 2022-12-12 NOTE — Progress Notes (Signed)
Subjective:    Patient ID: Dorothy Church, female    DOB: 01/05/1936, 87 y.o.   MRN: [id number]  Patient Care Team: Dana Allan, MD as PCP - General (Family Medicine) Stephanie Acre, MD (Inactive) as Consulting Physician (Internal Medicine) Emi Belfast, FNP (Nurse Practitioner)  Chief Complaint  Patient presents with   Acute Visit    DOE. Little wheezing. Cough with yellow sputum.    BACKGROUND/INTERVAL: Patient is an 87 year old lifelong never smoker with a history of persistent asthma who presents for evaluation of increasing dyspnea over the last 6-8 months.  Notes ineffectiveness of Symbicort which used to be effective previously.  She is a patient of Dr. Santiago Glad last seen in the clinic on 06 March 2022 no major interventions necessary at that time.  HPI Discussed the use of AI scribe software for clinical note transcription with the patient, who gave verbal consent to proceed.  History of Present Illness   The patient, with a lifelong history of asthma, presents with a six-to-eight-month history of progressive dyspnea, particularly on exertion. She reports a frequent need to cough and a sensation of 'junk in the throat.' The patient denies chest pain, palpitations, and lower extremity edema. She has been using Symbicort for a long time, which provides minimal relief, and a rescue inhaler three to four times a day.  The patient also has a history of multiple allergies, including medication and environmental allergens such as dust and mold. She has been experiencing gastroesophageal reflux disease, which is managed with medication and Tums, but denies any recent worsening. She reports nocturnal awakenings but denies that these are due to shortness of breath.  The patient has been under significant stress due to the recent passing of her twin sister. She has not had a chest x-ray or heart test in a while. The patient's father had a history of emphysema and was a heavy  smoker.      DATA 08/16/2014 PFTs: FEV1 0.93 L or 39% predicted, FVC 1.81 L or 57% predicted, FEV1/FVC 51%.  There is a significant bronchodilator response with a 56% net change on FEV1 postbronchodilator to 1.46 L or 62% of predicted.  Fusion capacity was normal by alveolar volume.  Lung volumes normal.   Review of Systems A 10 point review of systems was performed and it is as noted above otherwise negative.   Past Medical History:  Diagnosis Date   Abdominal pain    Abnormal CT of liver    Accident due to mechanical fall without injury 01/28/2022   Acute pain of left shoulder 09/28/2020   Anxiety    Anxiety 04/28/2020   Aortic stenosis    Aortic stenosis 03/04/2012   Overview:   Not confirmed by Dr Mariah Milling.    Aortic valve disorders    Arthritis    Asthma    Asthma 08/07/2012   Barrett's esophagus    [facility] last EGD 02/2018 Dr. Marva Panda    Barrett's esophagus 03/11/2017   Overview:  Dr Marva Panda   Carotid artery stenosis 12/30/2018   1-39% b/l carotids       Colon polyps    Colon polyps    COPD (chronic obstructive pulmonary disease) (HCC)    DDD (degenerative disc disease), cervical    DDD (degenerative disc disease), cervical 04/28/2020   DDD (degenerative disc disease), lumbar 01/22/2017   Depressive disorder    Diverticulosis    Erosive esophagitis    Erosive esophagitis 04/28/2020   Fundic gland polyps of  stomach, benign    Gastric polyp    Gastric polyps    Gastritis    Generalized osteoarthritis of hand 05/03/2015   GERD (gastroesophageal reflux disease)    GOA (generalized osteoarthritis) 11/23/2015   Goiter    Hiatal hernia    small    Hiatal hernia    History of colonic polyps 12/08/2013   Hypercholesteremia    Hypothyroidism    Impaired fasting glucose    Impaired fasting glucose 03/04/2012   Overview:   Glucose 111 02/2012   Intrinsic asthma, unspecified    Joint pain of ankle and foot, right 05/17/2015   Left rib fracture    Mechanical  complication of knee prosthesis, initial encounter (HCC) 08/25/2017   Obstructive sleep apnea (adult) (pediatric)    used cpap x 10 years per Dr. Belia Heman off as of 12/30/18    OSA (obstructive sleep apnea)    used cpap x 10 years per Dr. 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   Stress 04/28/2020   Syncope and collapse    TIA (transient ischemic attack)    TIA (transient ischemic attack) 12/11/2016   TIA (transient ischemic attack) 12/11/2016   UTI (urinary tract infection)    Vitamin D deficiency     Past Surgical History:  Procedure Laterality Date   BREAST EXCISIONAL BIOPSY Left    BREAST SURGERY     CATARACT EXTRACTION W/PHACO Right 11/07/2021   Procedure: CATARACT EXTRACTION PHACO AND INTRAOCULAR LENS PLACEMENT (IOC) RIGHT  8.87 00:50.8;  Surgeon: Galen Manila, MD;  Location: [facility];  Service: Ophthalmology;  Laterality: Right;   COLONOSCOPY     COLONOSCOPY WITH ESOPHAGOGASTRODUODENOSCOPY (EGD)     COLONOSCOPY WITH PROPOFOL N/A 12/14/2014   Procedure: COLONOSCOPY WITH PROPOFOL;  Surgeon: Christena Deem, MD;  Location: [facility] END[facility]COPY;  Service: Endoscopy;  Laterality: N/A;   COLONOSCOPY WITH PROPOFOL N/A 03/18/2018   Procedure: COLONOSCOPY WITH PROPOFOL;  Surgeon: Christena Deem, MD;  Location: [facility] ENDOSCOPY;  Service: Endoscopy;  Laterality: N/A;   ENUCLEATION     ESOPHAGOGASTRODUODENOSCOPY N/A 08/30/2020   Procedure: ESOPHAGOGASTRODUODENOSCOPY (EGD);  Surgeon: Regis Bill, MD;  Location: [facility] ENDOSCOPY;  Service: Endoscopy;  Laterality: N/A;   ESOPHAGOGASTRODUODENOSCOPY (EGD) WITH PROPOFOL N/A 12/14/2014   Procedure: ESOPHAGOGASTRODUODENOSCOPY (EGD) WITH  PROPOFOL;  Surgeon: Christena Deem, MD;  Location: [facility] ENDOSCOPY;  Service: Endoscopy;  Laterality: N/A;   ESOPHAGOGASTRODUODENOSCOPY (EGD) WITH PROPOFOL N/A 10/18/2016   Procedure: ESOPHAGOGASTRODUODENOSCOPY (EGD) WITH PROPOFOL;  Surgeon: Christena Deem, MD;  Location: [facility] ENDOSCOPY;  Service: Endoscopy;  Laterality: N/A;   ESOPHAGOGASTRODUODENOSCOPY (EGD) WITH PROPOFOL N/A 03/18/2018   Procedure: ESOPHAGOGASTRODUODENOSCOPY (EGD) WITH PROPOFOL;  Surgeon: Christena Deem, MD;  Location: [facility] ENDOSCOPY;  Service: Endoscopy;  Laterality: N/A;   ESOPHAGOGASTRODUODENOSCOPY (EGD) WITH PROPOFOL N/A 12/19/2020   Procedure: ESOPHAGOGASTRODUODENOSCOPY (EGD) WITH PROPOFOL;  Surgeon: Regis Bill, MD;  Location: [facility] ENDOSCOPY;  Service: Endoscopy;  Laterality: N/A;   EYE SURGERY     FRACTURE SURGERY     HIP SURGERY Left    pin and plate   IR ANGIO INTRA EXTRACRAN SEL COM CAROTID INNOMINATE BILAT MOD SED  10/17/2018   IR  ANGIO VERTEBRAL SEL VERTEBRAL BILAT MOD SED  10/17/2018   IR ANGIOGRAM EXTREMITY LEFT  10/17/2018   JOINT REPLACEMENT     Left Total Knee Arthroplasty     Dr Ernest Pine and also had 39 years ago from 2020    nasal polyps     removed x 2 affects sense of smell x 10-15 years from 04/28/20   prosthesis eye implant     REPLACEMENT UNICONDYLAR JOINT KNEE Left    TONSILLECTOMY     TOTAL KNEE REVISION Left 09/13/2017   Procedure: TOTAL KNEE REVISION;  Surgeon: Donato Heinz, MD;  Location: [facility] ORS;  Service: Orthopedics;  Laterality: Left;    Patient Active Problem List   Diagnosis Date Noted   Impaired fasting glucose 06/17/2022   BPPV (benign paroxysmal positional vertigo) 03/13/2022   Allergic rhinitis 03/10/2021   Gastroesophageal reflux disease 09/28/2020   Gastric polyp 04/28/2020   Lower esophageal ring (Schatzki) 04/28/2020   Insomnia 12/30/2018   Hiatal hernia    Mood disorder (HCC) 03/11/2017   Asthma, chronic, severe persistent, uncomplicated 05/11/2016    OA (osteoarthritis) 04/04/2015   Osteoporosis 10/07/2012   Vitamin D deficiency    Hypothyroidism    Hyperlipidemia 08/07/2012    Family History  Problem Relation Age of Onset   Breast cancer Mother        pagets dx 30s   Heart disease Father    Alcohol abuse Father    Dementia Sister        twin sister 79 dx'ed dementia   Bipolar disorder Brother     Social History   Tobacco Use   Smoking status: Never   Smokeless tobacco: Never  Substance Use Topics   Alcohol use: Yes    Alcohol/week: 1.0 standard drink of alcohol    Types: 1 Glasses of wine per week    Comment: occas. wine    Allergies  Allergen Reactions   Amoxicillin Other (See Comments)    Pencillins   Cefzil [Cefprozil] Other (See Comments)    Unknown   Cephalexin Other (See Comments)    Unknown   Propoxyphene Other (See Comments)    Unknown   Relafen [Nabumetone] Other (See Comments)    Unknown   Talwin [Pentazocine] Other (See Comments)    Unknown   Penicillins Other (See Comments)    Cannot remember reaction    Current Meds  Medication Sig   acetaminophen (TYLENOL) 500 MG tablet Take 1,000 mg by mouth every 8 (eight) hours as needed (PAIN).   albuterol (VENTOLIN HFA) 108 (90 Base) MCG/ACT inhaler Inhale 2 puffs into the lungs every 6 (six) hours as needed for wheezing.   aspirin EC 81 MG tablet Take 1 tablet (81 mg total) by mouth daily. Swallow whole.   azelastine (ASTELIN) 0.1 % nasal spray Place 1 spray into both nostrils 2 (two) times daily. Use in each nostril as directed   calcium carbonate (TUMS - DOSED IN MG ELEMENTAL CALCIUM) 500 MG chewable tablet Chew 1 tablet by mouth 4 (four) times daily as needed for indigestion or heartburn.   Cholecalciferol (VITAMIN D3) 50 MCG (2000 UT) capsule Take 1 capsule (2,000 Units total) by mouth daily.   fluticasone (FLONASE) 50 MCG/ACT nasal spray Place into both nostrils daily.   latanoprost (XALATAN) 0.005 % ophthalmic solution 1 drop at bedtime.    levothyroxine (SYNTHROID) 50 MCG tablet TAKE 1 TABLET BY MOUTH EVERY DAY BEFORE BREAKFAST   loratadine (CLARITIN) 10 MG tablet TAKE 1 TABLET BY MOUTH  EVERY DAY AS NEEDED FOR ALLERGY   pantoprazole (PROTONIX) 40 MG tablet TAKE 1 TABLET BY MOUTH EVERY DAY   PARoxetine (PAXIL) 40 MG tablet TAKE 1 TABLET BY MOUTH EVERY DAY IN THE MORNING   simvastatin (ZOCOR) 40 MG tablet Take 40 mg by mouth daily.   SYMBICORT 160-4.5 MCG/ACT inhaler INHALE 2 PUFFS INTO THE LUNGS TWICE A DAY   zaleplon (SONATA) 5 MG capsule TAKE 1 CAPSULE (5 MG TOTAL) BY MOUTH AT BEDTIME AS NEEDED FOR SLEEP.    Immunization History  Administered Date(s) Administered   Fluad Quad(high Dose 65+) 12/05/2019, 11/04/2020   Influenza Whole 12/14/2010   Influenza, High Dose Seasonal PF 12/02/2017, 12/08/2018   Influenza,inj,Quad PF,6+ Mos 11/10/2012, 11/05/2013, 11/10/2014, 10/17/2015, 10/24/2016, 10/17/2021   Influenza-Unspecified 12/04/2011, 11/10/2012, 11/05/2013, 12/07/2018   Moderna Sars-Covid-2 Vaccination 03/03/2019, 03/31/2019, 11/30/2019, 12/26/2019, 07/07/2020   Pfizer(Comirnaty)Fall Seasonal Vaccine 12 years and older 12/01/2021, 08/07/2022   Pneumococcal Conjugate-13 10/07/2009, 04/23/2013, 11/24/2013   Pneumococcal Polysaccharide-23 11/13/2009, 12/19/2018   Td 04/17/2016   Zoster Recombinant(Shingrix) 04/09/2018, 09/15/2018   Zoster, Live 05/08/2007        Objective:     BP 122/70 (BP Location: Right Arm, Cuff Size: Normal)   Pulse 91   Temp (!) 97.1 F (36.2 C)   Ht 5\' 6"  (1.676 m)   Wt 143 lb 6.4 oz (65 kg)   SpO2 97%   BMI 23.15 kg/m   SpO2: 97 % O2 Device: None (Room air)  GENERAL: Developed, well-nourished woman, no acute distress, fully ambulatory.  No conversational dyspnea. HEAD: Normocephalic, atraumatic.  EYES: Pupils equal, round, reactive to light.  No scleral icterus.  MOUTH: Dentition intact, oral mucosa moist.  No thrush. NECK: Supple. No thyromegaly. Trachea midline. No JVD.  No  adenopathy. PULMONARY: Good air entry bilaterally.  No adventitious sounds. CARDIOVASCULAR: S1 and S2. Regular rate and rhythm.  No rubs, murmurs or gallops heard. ABDOMEN: Benign. MUSCULOSKELETAL: No joint deformity, no clubbing, no edema.  NEUROLOGIC: No overt focal deficit, no gait disturbance, speech is fluent. SKIN: Intact,warm,dry. PSYCH: Behavior normal.    Ambulatory oxymetry was performed today:  At rest on room air oxygen saturation was 95%, the patient ambulated at a slow pace, completed 2 laps, O2 nadir any 93%, near shortness of breath.  Resting heart rate was 9 bpm at maximum for this exercise 105 bpm.  This indicated patient did not reach maximum reserve.   Lab Results  Component Value Date   NITRICOXIDE 15 12/12/2022    Assessment & Plan:   No diagnosis found.  Orders Placed This Encounter  Procedures   DG Chest 2 View    Standing Status:   Future    Standing Expiration Date:   12/12/2023    Order Specific Question:   Reason for Exam (SYMPTOM  OR DIAGNOSIS REQUIRED)    Answer:   Shortness of breath, history of asthma    Order Specific Question:   Preferred imaging location?    Answer:   [facility]   Alpha-1 antitrypsin phenotype    Standing Status:   Future    Standing Expiration Date:   12/12/2023   CBC w/Diff    Standing Status:   Future    Standing Expiration Date:   12/12/2023   Allergen Panel (27) + IGE    Standing Status:   Future    Standing Expiration Date:   12/12/2023   Pulmonary Function Test [facility] Only    Standing Status:   Future    Standing  Expiration Date:   12/12/2023    Order Specific Question:   Full PFT: includes the following: basic spirometry, spirometry pre & post bronchodilator, diffusion capacity (DLCO), lung volumes    Answer:   Full PFT    Order Specific Question:   This test can only be performed at    Answer:   [facility]   Nitric oxide   ECHOCARDIOGRAM COMPLETE    Standing Status:   Future    Standing  Expiration Date:   12/12/2023    Order Specific Question:   Where should this test be performed    Answer:   [facility]    Order Specific Question:   Please indicate who you request to read the nuc med / echo results.    Answer:   [facility]    Order Specific Question:   Perflutren DEFINITY (image enhancing agent) should be administered unless hypersensitivity or allergy exist    Answer:   Administer Perflutren    Order Specific Question:   Reason for exam-Echo    Answer:   Dyspnea  R06.00    Meds ordered this encounter  Medications   Budeson-Glycopyrrol-Formoterol (BREZTRI AEROSPHERE) 160-9-4.8 MCG/ACT AERO    Sig: Inhale 2 puffs into the lungs in the morning and at bedtime.    Dispense:  11.8 g    Refill:  0    Order Specific Question:   Lot Number?    Answer:   [id number]    Order Specific Question:   Expiration Date?    Answer:   03/22/2025    Order Specific Question:   Manufacturer?    Answer:   [employer] [[id number]]    Order Specific Question:   Quantity    Answer:   2   Assessment and Plan    Asthma Increased dyspnea, cough, and use of rescue inhaler over the past 6-8 months. Symbicort provides minimal relief. No nocturnal symptoms. No recent chest pain, palpitations, or leg swelling. Possible air trapping noted on exam. -Change Symbicort to Breztri (2 puffs BID). -Order chest x-ray, echocardiogram, and pulmonary function tests to evaluate for possible airway remodeling and COPD. -Order blood test for hereditary form of COPD. -Follow-up in 4-6 weeks to reassess symptoms and review test results.  Gastroesophageal Reflux Disease (GERD) Chronic condition, no recent worsening. Currently on medication (PPI) but still requires Tums. -Continue current treatment. -May need GI eval.  Allergies Multiple medication and environmental allergies. -Continue current management.     Patient in follow-up in 4 to 6 weeks time she is to contact us prior to that time should any  new difficulties arise.  Total visit time 42 minutes.  Gailen Shelter, MD Advanced Bronchoscopy PCCM [facility]    *This note was dictated using voice recognition software/Dragon.  Despite best efforts to proofread, errors can occur which can change the meaning. Any transcriptional errors that result from this process are unintentional and may not be fully corrected at the time of dictation.

## 2022-12-12 NOTE — Patient Instructions (Signed)
VISIT SUMMARY:  During today's visit, we discussed your ongoing asthma symptoms, including increased shortness of breath and cough over the past several months. We also reviewed your history of gastroesophageal reflux disease (GERD) and allergies. Given your symptoms and family history, we have planned several tests to better understand your condition and adjust your treatment plan accordingly.  YOUR PLAN:  -ASTHMA: Asthma is a condition where your airways narrow and swell, making it difficult to breathe. Given your increased symptoms, we are changing your medication from Symbicort to Breztri (2 puffs twice a day). We will also conduct a chest x-ray, echocardiogram, and pulmonary function tests to check for any changes in your airways or possible COPD. Additionally, we will do a blood test to check for a hereditary form of COPD. Please follow up in 4-6 weeks to review your symptoms and test results.  -GASTROESOPHAGEAL REFLUX DISEASE (GERD): GERD is a condition where stomach acid frequently flows back into the tube connecting your mouth and stomach, causing discomfort. Your condition has not worsened recently, so we will continue with your current treatment plan.  -ALLERGIES: You have multiple allergies to medications and environmental factors like dust and mold. We will continue with your current management plan for these allergies.  INSTRUCTIONS:  Please schedule a follow-up appointment in 4-6 weeks to reassess your symptoms and review the results of your chest x-ray, echocardiogram, pulmonary function tests, and blood test for hereditary COPD.

## 2022-12-15 LAB — ALLERGEN PANEL (27) + IGE
Alternaria Alternata IgE: <0.1 kU/L
Aspergillus Fumigatus IgE: <0.1 kU/L
Bahia Grass IgE: <0.1 kU/L
Bermuda Grass IgE: <0.1 kU/L
Cat Dander IgE: 0.23 kU/L — AB
Cedar, Mountain IgE: <0.1 kU/L
Cladosporium Herbarum IgE: <0.1 kU/L
Cocklebur IgE: <0.1 kU/L
Cockroach, American IgE: <0.1 kU/L
Common Silver Birch IgE: <0.1 kU/L
D Farinae IgE: 12.5 kU/L — AB
D Pteronyssinus IgE: 7.64 kU/L — AB
Dog Dander IgE: 0.6 kU/L — AB
Elm, American IgE: <0.1 kU/L
Hickory, White IgE: <0.1 kU/L
IgE (Immunoglobulin E), Serum: 103 [IU]/mL (ref 6–495)
Johnson Grass IgE: <0.1 kU/L
Kentucky Bluegrass IgE: <0.1 kU/L
Maple/Box Elder IgE: <0.1 kU/L
Mucor Racemosus IgE: <0.1 kU/L
Oak, White IgE: <0.1 kU/L
Penicillium Chrysogen IgE: <0.1 kU/L
Pigweed, Rough IgE: <0.1 kU/L
Plantain, English IgE: <0.1 kU/L
Ragweed, Short IgE: 0.11 kU/L — AB
Setomelanomma Rostrat: <0.1 kU/L
Timothy Grass IgE: <0.1 kU/L
White Mulberry IgE: <0.1 kU/L

## 2022-12-17 LAB — ALPHA-1-ANTITRYPSIN PHENOTYP: A-1 Antitrypsin, Ser: 156 mg/dL (ref 101–187)

## 2022-12-21 ENCOUNTER — Encounter: Payer: Self-pay | Admitting: Pulmonary Disease

## 2022-12-21 MED ORDER — BREZTRI AEROSPHERE 160-9-4.8 MCG/ACT IN AERO: 2.0000 | INHALATION_SPRAY | Freq: Two times a day (BID) | RESPIRATORY_TRACT | 11 refills | Status: DC

## 2022-12-21 NOTE — Telephone Encounter (Signed)
We will continue with the [facility] and can send a prescription for it.

## 2023-01-08 ENCOUNTER — Ambulatory Visit: Payer: Medicare HMO

## 2023-01-08 ENCOUNTER — Other Ambulatory Visit: Payer: Self-pay | Admitting: Podiatry

## 2023-01-09 DIAGNOSIS — M810 Age-related osteoporosis without current pathological fracture: Secondary | ICD-10-CM | POA: Diagnosis not present

## 2023-01-12 ENCOUNTER — Other Ambulatory Visit: Payer: Self-pay | Admitting: Family Medicine

## 2023-01-13 ENCOUNTER — Other Ambulatory Visit: Payer: Self-pay | Admitting: Family Medicine

## 2023-01-15 ENCOUNTER — Telehealth: Payer: Self-pay | Admitting: Family Medicine

## 2023-01-15 ENCOUNTER — Other Ambulatory Visit: Payer: Self-pay

## 2023-01-15 ENCOUNTER — Other Ambulatory Visit: Payer: Self-pay | Admitting: Family Medicine

## 2023-01-15 NOTE — Telephone Encounter (Signed)
Patient called and said she has been waiting on her refill since last week. She has contacted her pharmacy and they have not received anything yet. Her number is [phone].

## 2023-01-15 NOTE — Telephone Encounter (Signed)
Patient just called and said she needs a refill on zaleplon (SONATA) 5 MG capsule. [phone] [place] Rough, [facility] [address], [place] [address] Phone: [phone]  Fax: [phone] DEA #: [id number]  Her number is [phone]

## 2023-01-15 NOTE — Telephone Encounter (Signed)
Rx sent to provider 

## 2023-01-16 ENCOUNTER — Encounter: Payer: Self-pay | Admitting: Family Medicine

## 2023-01-16 ENCOUNTER — Other Ambulatory Visit: Payer: Self-pay

## 2023-01-16 MED ORDER — ZALEPLON 5 MG PO CAPS: 5.0000 mg | ORAL_CAPSULE | Freq: Every evening | ORAL | 0 refills | Status: DC | PRN

## 2023-01-16 NOTE — Telephone Encounter (Signed)
Sent to the Dr. Rennis Petty the day Dr. Darrick Huntsman.

## 2023-01-16 NOTE — Telephone Encounter (Signed)
zaleplon (SONATA) 5 MG capsule #30 0 refills  LOV 10/08/2022 NOV 06/17/2023

## 2023-01-16 NOTE — Telephone Encounter (Signed)
Duplicate request. Please refuse the request.

## 2023-01-22 ENCOUNTER — Telehealth: Payer: Self-pay

## 2023-01-22 ENCOUNTER — Other Ambulatory Visit (HOSPITAL_COMMUNITY): Payer: Self-pay

## 2023-01-22 NOTE — Telephone Encounter (Signed)
Please complete PA for Zaleplon 5mg .

## 2023-01-22 NOTE — Telephone Encounter (Signed)
PA request has been Submitted. New Encounter created for follow up. For additional info see Pharmacy Prior Auth telephone encounter from 01/22/23.

## 2023-01-22 NOTE — Telephone Encounter (Signed)
Pharmacy Patient Advocate Encounter   Received notification from Pt Calls Messages that prior authorization for Zaleplon 5MG  capsules is required/requested.   Insurance verification completed.   The patient is insured through [facility] .   Per test claim: PA required; PA started via CoverMyMeds. KEY [id number] . Waiting for clinical questions to populate.

## 2023-01-23 NOTE — Telephone Encounter (Signed)
Clinical questions answered. PA submitted

## 2023-01-23 NOTE — Telephone Encounter (Signed)
Pharmacy Patient Advocate Encounter  Received notification from [facility] that Prior Authorization for ZALEPLON 5MG  has been DENIED.  Full denial letter will be uploaded to the media tab. See denial reason below.   PA #/Case ID/Reference #: [id number]   DENIAL REASON: Patient has not tried Doxepin (3mg  or 6mg )

## 2023-01-24 ENCOUNTER — Other Ambulatory Visit: Payer: Self-pay | Admitting: Family Medicine

## 2023-01-24 DIAGNOSIS — G4700 Insomnia, unspecified: Secondary | ICD-10-CM

## 2023-01-24 MED ORDER — DOXEPIN HCL 3 MG PO TABS: 3.0000 mg | ORAL_TABLET | Freq: Every evening | ORAL | 0 refills | Status: DC | PRN

## 2023-01-24 NOTE — Telephone Encounter (Signed)
Called pt and she is ok with trying the Doxepin.

## 2023-01-31 ENCOUNTER — Other Ambulatory Visit: Payer: Self-pay | Admitting: Family Medicine

## 2023-01-31 ENCOUNTER — Ambulatory Visit: Payer: Medicare HMO | Admitting: Pulmonary Disease

## 2023-01-31 DIAGNOSIS — G4700 Insomnia, unspecified: Secondary | ICD-10-CM

## 2023-02-11 ENCOUNTER — Telehealth: Payer: Self-pay | Admitting: Pulmonary Disease

## 2023-02-11 ENCOUNTER — Ambulatory Visit
Admission: RE | Admit: 2023-02-11 | Discharge: 2023-02-11 | Disposition: A | Discharge status: Home / Self Care | Payer: Medicare HMO | Source: Ambulatory Visit | Attending: Pulmonary Disease | Admitting: Pulmonary Disease

## 2023-02-11 DIAGNOSIS — G4730 Sleep apnea, unspecified: Secondary | ICD-10-CM | POA: Insufficient documentation

## 2023-02-11 DIAGNOSIS — R0602 Shortness of breath: Secondary | ICD-10-CM | POA: Diagnosis not present

## 2023-02-11 DIAGNOSIS — J449 Chronic obstructive pulmonary disease, unspecified: Secondary | ICD-10-CM | POA: Diagnosis not present

## 2023-02-11 DIAGNOSIS — E785 Hyperlipidemia, unspecified: Secondary | ICD-10-CM | POA: Insufficient documentation

## 2023-02-11 DIAGNOSIS — R55 Syncope and collapse: Secondary | ICD-10-CM | POA: Diagnosis not present

## 2023-02-11 DIAGNOSIS — Z8673 Personal history of transient ischemic attack (TIA), and cerebral infarction without residual deficits: Secondary | ICD-10-CM | POA: Insufficient documentation

## 2023-02-11 LAB — ECHOCARDIOGRAM COMPLETE
AR max vel: 2.42 cm2
AV Area VTI: 2.47 cm2
AV Area mean vel: 2.11 cm2
AV Mean grad: 3 mm[Hg]
AV Peak grad: 6.1 mm[Hg]
Ao pk vel: 1.23 m/s
Area-P 1/2: 3.28 cm2
Calc EF: 61.3 %
MV VTI: 3.11 cm2
S' Lateral: 2.9 cm
Single Plane A2C EF: 59.4 %
Single Plane A4C EF: 63.5 %

## 2023-02-11 NOTE — Telephone Encounter (Signed)
Pt needs to be sch for PFT

## 2023-02-11 NOTE — Progress Notes (Signed)
*  PRELIMINARY RESULTS* Echocardiogram 2D Echocardiogram has been performed.  Carolyne Fiscal 02/11/2023, 11:51 AM

## 2023-02-11 NOTE — Telephone Encounter (Signed)
I have spoke with Dorothy Church and her PFT has been rescheduled on 02/21/2023 @ 10:00am [facility] [employer] and she is aware of this appt

## 2023-02-19 ENCOUNTER — Other Ambulatory Visit: Payer: Self-pay | Admitting: Family Medicine

## 2023-02-19 DIAGNOSIS — G4700 Insomnia, unspecified: Secondary | ICD-10-CM

## 2023-02-21 ENCOUNTER — Ambulatory Visit: Payer: Medicare Other | Attending: Pulmonary Disease

## 2023-02-21 DIAGNOSIS — J4540 Moderate persistent asthma, uncomplicated: Secondary | ICD-10-CM

## 2023-02-21 DIAGNOSIS — R0602 Shortness of breath: Secondary | ICD-10-CM

## 2023-02-21 LAB — PULMONARY FUNCTION TEST ARMC ONLY
DL/VA % pred: 76 %
DL/VA: 3.06 ml/min/mmHg/L
DLCO unc % pred: 63 %
DLCO unc: 12.58 ml/min/mmHg
FEF 25-75 Post: 0.57 L/s
FEF 25-75 Pre: 0.29 L/s
FEF2575-%Change-Post: 97 %
FEF2575-%Pred-Post: 44 %
FEF2575-%Pred-Pre: 22 %
FEV1-%Change-Post: 35 %
FEV1-%Pred-Post: 47 %
FEV1-%Pred-Pre: 34 %
FEV1-Post: 0.92 L
FEV1-Pre: 0.68 L
FEV1FVC-%Change-Post: 10 %
FEV1FVC-%Pred-Pre: 64 %
FEV6-%Change-Post: 22 %
FEV6-%Pred-Post: 71 %
FEV6-%Pred-Pre: 57 %
FEV6-Post: 1.76 L
FEV6-Pre: 1.44 L
FEV6FVC-%Change-Post: 0 %
FEV6FVC-%Pred-Post: 104 %
FEV6FVC-%Pred-Pre: 104 %
FVC-%Change-Post: 23 %
FVC-%Pred-Post: 68 %
FVC-%Pred-Pre: 55 %
FVC-Post: 1.79 L
FVC-Pre: 1.46 L
Post FEV1/FVC ratio: 51 %
Post FEV6/FVC ratio: 98 %
Pre FEV1/FVC ratio: 47 %
Pre FEV6/FVC Ratio: 99 %
RV % pred: 184 %
RV: 4.78 L
TLC % pred: 128 %
TLC: 6.87 L

## 2023-02-21 MED ORDER — ALBUTEROL SULFATE (2.5 MG/3ML) 0.083% IN NEBU
2.5000 mg | INHALATION_SOLUTION | Freq: Once | RESPIRATORY_TRACT | Status: AC
  Administered 2023-02-21: 2.5 mg via RESPIRATORY_TRACT
  Filled 2023-02-21: qty 3

## 2023-02-27 ENCOUNTER — Encounter: Payer: Self-pay | Admitting: Pulmonary Disease

## 2023-02-27 NOTE — Telephone Encounter (Signed)
 Her lung function is not changed significantly from prior breathing tests.  She has severe obstruction.  Other as noted the lung function has not changed significantly from prior.

## 2023-03-08 ENCOUNTER — Other Ambulatory Visit: Payer: Self-pay | Admitting: Internal Medicine

## 2023-03-08 ENCOUNTER — Other Ambulatory Visit: Payer: Self-pay | Admitting: Family Medicine

## 2023-03-08 DIAGNOSIS — E039 Hypothyroidism, unspecified: Secondary | ICD-10-CM

## 2023-03-08 DIAGNOSIS — J309 Allergic rhinitis, unspecified: Secondary | ICD-10-CM

## 2023-03-08 DIAGNOSIS — F39 Unspecified mood [affective] disorder: Secondary | ICD-10-CM

## 2023-03-12 ENCOUNTER — Other Ambulatory Visit: Payer: Self-pay | Admitting: Family Medicine

## 2023-03-12 DIAGNOSIS — G4700 Insomnia, unspecified: Secondary | ICD-10-CM

## 2023-03-21 ENCOUNTER — Ambulatory Visit: Payer: Self-pay | Admitting: Family Medicine

## 2023-03-21 NOTE — Telephone Encounter (Signed)
Pt is scheduled to see [place] tomorrow at 1:30.

## 2023-03-21 NOTE — Telephone Encounter (Signed)
  Chief Complaint: UTI symptoms; cold symptoms Symptoms: runny nose, productive cough, burning with urination, urinary frequency and urgency Frequency: urinary x 3 days; cough/runny nose x 1 week or more Pertinent Negatives: Patient denies blood in urine, fever, foul odor to urine, cloudy urine, abdominal pain, flank pain, sore throat, earaches, nausea, vomiting. Disposition: [] ED /[] Urgent Care (no appt availability in office) / [x] Appointment(In office/virtual)/ []  [facility] [facility]/ [] Home Care/ [] Refused Recommended Disposition /[] [facility]/ []  Follow-up with PCP Additional Notes: Patient calling in regarding possible UTI symptoms. When asked if she has additional symptoms patient mentioned she has a productive cough, runny nose and asthma with wheezing intermittently going on for a week or more. Patient agreeable to office visit tomorrow and verbalizes understanding to rest, drink fluids and call back for any new or worsening symptoms. Copied from CRM [id number]. Topic: Clinical - Red Word Triage >> Mar 21, 2023  2:55 PM Dorothy Church wrote: Red Word that prompted transfer to Nurse Triage: Patient has been having burning when urinating and urgency during day/night for 3 days. No fever or foul odor to urine. Reason for Disposition  Urinating more frequently than usual (i.e., frequency)  [1] Nasal discharge AND [2] present > 10 days  Answer Assessment - Initial Assessment Questions 1. SYMPTOM: "What's the main symptom you're concerned about?" (e.g., frequency, incontinence)     Urgency, frequency, and burning with urination.  2. ONSET: "When did the symptoms  start?"     X 3 days.  3. PAIN: "Is there any pain?" If Yes, ask: "How bad is it?" (Scale: 1-10; mild, moderate, severe)     Moderate burning with urination.  4. CAUSE: "What do you think is causing the symptoms?"     Urinary tract infection occasionally occurring in the past and feels like this.  5. OTHER SYMPTOMS: "Do  you have any other symptoms?" (e.g., blood in urine, fever, flank pain, pain with urination)     Burning with urination.  Answer Assessment - Initial Assessment Questions 1. ONSET: "When did the cold start?"      X 1 week or more.  2. AMOUNT: "How much discharge is there?"      Patient states its more than a little bit.  3. COUGH: "Do you have a cough?" If Yes, ask: "Describe the color of your sputum" (clear, white, yellow, green)     Yes, yellow sputum coming up.  4. RESPIRATORY DISTRESS: "Describe your breathing."      Patient states she has chronic asthma.  5. FEVER: "Do you have a fever?" If Yes, ask: "What is your temperature, how was it measured, and when did it start?"     Denies.  6. SEVERITY: "Overall, how bad are you feeling right now?" (e.g., doesn't interfere with normal activities, staying home from school/work, staying in bed)      Patient states she feels tired and cranky but she has been able to perform normal activities. She states she has been able to get out of bed and perform activities.  7. OTHER SYMPTOMS: "Do you have any other symptoms?" (e.g., sore throat, earache, wheezing, vomiting)     Wheezing off and on, she states she does that all the time chronically.  Protocols used: Urinary Symptoms-A-AH, Common Cold-A-AH

## 2023-03-22 ENCOUNTER — Ambulatory Visit: Payer: Medicare Other | Admitting: Internal Medicine

## 2023-03-25 ENCOUNTER — Other Ambulatory Visit: Payer: Self-pay | Admitting: Family Medicine

## 2023-03-25 DIAGNOSIS — G4700 Insomnia, unspecified: Secondary | ICD-10-CM

## 2023-03-28 ENCOUNTER — Other Ambulatory Visit (HOSPITAL_COMMUNITY): Payer: Self-pay

## 2023-03-28 ENCOUNTER — Telehealth: Payer: Self-pay

## 2023-03-28 ENCOUNTER — Other Ambulatory Visit: Payer: Self-pay | Admitting: Family Medicine

## 2023-03-28 DIAGNOSIS — F5101 Primary insomnia: Secondary | ICD-10-CM

## 2023-03-28 MED ORDER — RAMELTEON 8 MG PO TABS: 8.0000 mg | ORAL_TABLET | Freq: Every day | ORAL | 3 refills | Status: DC

## 2023-03-28 NOTE — Telephone Encounter (Signed)
 Per test claim: Zolpidem, eszopiclone, zaleplon , ramelteon , and belsomra are preferred by the insurance.  If suggested medication is appropriate, Please send in a new RX and discontinue this one. If not, please advise as to why it's not appropriate so that we may request a Prior Authorization. Please note, some preferred medications may still require a PA.  If the suggested medications have not been trialed and there are no contraindications to their use, the PA will not be submitted, as it will not be approved.

## 2023-03-28 NOTE — Telephone Encounter (Signed)
 Pharmacy Patient Advocate Encounter   Received notification from RX Request Messages that prior authorization for Doxepin  HCl 3mg  tabs is required/requested.   Insurance verification completed.   The patient is insured through [place] .   Per test claim: Zolpidem, eszopiclone, zaleplon , ramelteon , and belsomra are preferred by the insurance.  If suggested medication is appropriate, Please send in a new RX and discontinue this one. If not, please advise as to why it's not appropriate so that we may request a Prior Authorization. Please note, some preferred medications may still require a PA.  If the suggested medications have not been trialed and there are no contraindications to their use, the PA will not be submitted, as it will not be approved.  THIS HAS BEEN COMMUNICATED IN ORIGINAL REQUEST MESSAGE WITH CLINICAL CARE TEAM

## 2023-04-04 ENCOUNTER — Ambulatory Visit: Payer: Medicare Other | Admitting: Pulmonary Disease

## 2023-04-04 ENCOUNTER — Encounter: Payer: Self-pay | Admitting: Pulmonary Disease

## 2023-04-04 VITALS — BP 118/60 | HR 94 | Temp 97.3°F | Resp 18 | Ht 66.0 in | Wt 148.4 lb

## 2023-04-04 DIAGNOSIS — K2100 Gastro-esophageal reflux disease with esophagitis, without bleeding: Secondary | ICD-10-CM

## 2023-04-04 DIAGNOSIS — J4520 Mild intermittent asthma, uncomplicated: Secondary | ICD-10-CM

## 2023-04-04 DIAGNOSIS — R0602 Shortness of breath: Secondary | ICD-10-CM | POA: Diagnosis not present

## 2023-04-04 DIAGNOSIS — J4540 Moderate persistent asthma, uncomplicated: Secondary | ICD-10-CM | POA: Diagnosis not present

## 2023-04-04 DIAGNOSIS — K449 Diaphragmatic hernia without obstruction or gangrene: Secondary | ICD-10-CM | POA: Diagnosis not present

## 2023-04-04 LAB — NITRIC OXIDE: Nitric Oxide: 31

## 2023-04-04 MED ORDER — ALBUTEROL SULFATE HFA 108 (90 BASE) MCG/ACT IN AERS: 2.0000 | INHALATION_SPRAY | Freq: Four times a day (QID) | RESPIRATORY_TRACT | 5 refills | Status: AC | PRN

## 2023-04-04 NOTE — Patient Instructions (Addendum)
VISIT SUMMARY:  Today, we discussed your chronic asthma and gastroesophageal reflux disease (GERD). We reviewed your recent breathing tests and current medications, and we talked about your allergies and how they might be affecting your asthma. We also reviewed your GERD symptoms and the results of your last upper endoscopy. We made some adjustments to your treatment plan to help manage your symptoms more effectively.  YOUR PLAN:  -CHRONIC ASTHMA: Chronic asthma is a long-term condition where your airways become inflamed and narrow, making it hard to breathe. Your recent breathing tests showed improvement after using a nebulizer, indicating that your airway obstruction is reversible. We will continue with your current medication, Breztri, and send a prescription for a rescue inhaler. We also recommend pulmonary rehab to help with your symptoms and will check the level of airway inflammation.  -GASTROESOPHAGEAL REFLUX DISEASE (GERD): GERD is a condition where stomach acid frequently flows back into the tube connecting your mouth and stomach, causing irritation. Your current medications are not very effective, especially at night. We will continue with Protonix and consider further evaluation if your symptoms persist.  -GENERAL HEALTH MAINTENANCE: We discussed the potential benefit of physical therapy for shoulder tension, which may help improve your breathing. It is recommended to discuss this with your primary care physician.  INSTRUCTIONS:  Please follow up in three months.

## 2023-04-04 NOTE — Progress Notes (Unsigned)
Subjective:    Patient ID: Dorothy Church, female    DOB: 24-Nov-1935, 88 y.o.   MRN: [id number]  Patient Care Team: Dana Allan, MD as PCP - General (Family Medicine) Stephanie Acre, MD (Inactive) as Consulting Physician (Internal Medicine) Emi Belfast, FNP (Nurse Practitioner)  Chief Complaint  Patient presents with  . Follow-up    Asthma- breathing is no better but unsure if any worse. Walking is hard.     BACKGROUND/INTERVAL:  HPI    Review of Systems A 10 point review of systems was performed and it is as noted above otherwise negative.   Patient Active Problem List   Diagnosis Date Noted  . Impaired fasting glucose 06/17/2022  . BPPV (benign paroxysmal positional vertigo) 03/13/2022  . Allergic rhinitis 03/10/2021  . Gastroesophageal reflux disease 09/28/2020  . Gastric polyp 04/28/2020  . Lower esophageal ring ([place]) 04/28/2020  . Insomnia 12/30/2018  . Hiatal hernia   . Mood disorder (HCC) 03/11/2017  . Asthma, chronic, severe persistent, uncomplicated 05/11/2016  . OA (osteoarthritis) 04/04/2015  . Osteoporosis 10/07/2012  . Vitamin D deficiency   . Hypothyroidism   . Hyperlipidemia 08/07/2012    Social History   Tobacco Use  . Smoking status: Never  . Smokeless tobacco: Never  Substance Use Topics  . Alcohol use: Yes    Alcohol/week: 1.0 standard drink of alcohol    Types: 1 Glasses of wine per week    Comment: occas. wine    Allergies  Allergen Reactions  . Amoxicillin Other (See Comments)    Pencillins  . Cefzil [Cefprozil] Other (See Comments)    Unknown  . Cephalexin Other (See Comments)    Unknown  . Propoxyphene Other (See Comments)    Unknown  . Relafen [Nabumetone] Other (See Comments)    Unknown  . Talwin [Pentazocine] Other (See Comments)    Unknown  . Penicillins Other (See Comments)    Cannot remember reaction    Current Meds  Medication Sig  . acetaminophen (TYLENOL) 500 MG tablet Take 1,000 mg by mouth  every 8 (eight) hours as needed (PAIN).  Marland Kitchen albuterol (VENTOLIN HFA) 108 (90 Base) MCG/ACT inhaler Inhale 2 puffs into the lungs every 6 (six) hours as needed for wheezing.  Marland Kitchen aspirin EC 81 MG tablet Take 1 tablet (81 mg total) by mouth daily. Swallow whole.  Marland Kitchen azelastine (ASTELIN) 0.1 % nasal spray Place 1 spray into both nostrils 2 (two) times daily. Use in each nostril as directed  . Budeson-Glycopyrrol-Formoterol (BREZTRI AEROSPHERE) 160-9-4.8 MCG/ACT AERO Inhale 2 puffs into the lungs in the morning and at bedtime.  . calcium carbonate (TUMS - DOSED IN MG ELEMENTAL CALCIUM) 500 MG chewable tablet Chew 1 tablet by mouth 4 (four) times daily as needed for indigestion or heartburn.  . Cholecalciferol (VITAMIN D3) 50 MCG (2000 UT) capsule Take 1 capsule (2,000 Units total) by mouth daily.  . fluticasone (FLONASE) 50 MCG/ACT nasal spray Place into both nostrils daily.  Marland Kitchen latanoprost (XALATAN) 0.005 % ophthalmic solution 1 drop at bedtime.  Marland Kitchen levothyroxine (SYNTHROID) 50 MCG tablet TAKE 1 TABLET BY MOUTH EVERY DAY BEFORE BREAKFAST  . levothyroxine (SYNTHROID) 50 MCG tablet TAKE 1 TABLET BY MOUTH EVERY DAY BEFORE BREAKFAST  . loratadine (CLARITIN) 10 MG tablet TAKE 1 TABLET BY MOUTH EVERY DAY AS NEEDED FOR ALLERGY  . pantoprazole (PROTONIX) 40 MG tablet TAKE 1 TABLET BY MOUTH EVERY DAY  . PARoxetine (PAXIL) 40 MG tablet TAKE 1 TABLET BY MOUTH EVERY DAY  IN THE MORNING  . ramelteon (ROZEREM) 8 MG tablet Take 1 tablet (8 mg total) by mouth at bedtime.  . simvastatin (ZOCOR) 40 MG tablet Take 40 mg by mouth daily.    Immunization History  Administered Date(s) Administered  . Fluad Quad(high Dose 65+) 12/05/2019, 11/04/2020  . Influenza Whole 12/14/2010  . Influenza, High Dose Seasonal PF 12/02/2017, 12/08/2018  . Influenza,inj,Quad PF,6+ Mos 11/10/2012, 11/05/2013, 11/10/2014, 10/17/2015, 10/24/2016, 10/17/2021  . Influenza-Unspecified 12/04/2011, 11/10/2012, 11/05/2013, 12/07/2018  . Moderna  Sars-Covid-2 Vaccination 03/03/2019, 03/31/2019, 11/30/2019, 12/26/2019, 07/07/2020  . Pfizer(Comirnaty)Fall Seasonal Vaccine 12 years and older 12/01/2021, 08/07/2022  . Pneumococcal Conjugate-13 10/07/2009, 04/23/2013, 11/24/2013  . Pneumococcal Polysaccharide-23 11/13/2009, 12/19/2018  . Td 04/17/2016  . Zoster Recombinant(Shingrix) 04/09/2018, 09/15/2018  . Zoster, Live 05/08/2007        Objective:     BP 118/60   Pulse 94   Temp (!) 97.3 F (36.3 C) (Temporal)   Resp 18   Ht 5\' 6"  (1.676 m)   Wt 148 lb 6.4 oz (67.3 kg)   SpO2 98%   BMI 23.95 kg/m   SpO2: 98 %  GENERAL: HEAD: Normocephalic, atraumatic.  EYES: Pupils equal, round, reactive to light.  No scleral icterus.  MOUTH:  NECK: Supple. No thyromegaly. Trachea midline. No JVD.  No adenopathy. PULMONARY: Good air entry bilaterally.  No adventitious sounds. CARDIOVASCULAR: S1 and S2. Regular rate and rhythm.  ABDOMEN: MUSCULOSKELETAL: No joint deformity, no clubbing, no edema.  NEUROLOGIC:  SKIN: Intact,warm,dry. PSYCH:        Assessment & Plan:   No diagnosis found.  No orders of the defined types were placed in this encounter.   No orders of the defined types were placed in this encounter.      Advised if symptoms do not improve or worsen, to please contact office for sooner follow up or seek emergency care.    I spent xxx minutes of dedicated to the care of this patient on the date of this encounter to include pre-visit review of records, face-to-face time with the patient discussing conditions above, post visit ordering of testing, clinical documentation with the electronic health record, making appropriate referrals as documented, and communicating necessary findings to members of the patients care team.     C. Danice Goltz, MD Advanced Bronchoscopy PCCM [facility]    *This note was generated using voice recognition software/Dragon and/or AI transcription program.   Despite best efforts to proofread, errors can occur which can change the meaning. Any transcriptional errors that result from this process are unintentional and may not be fully corrected at the time of dictation.

## 2023-04-10 ENCOUNTER — Encounter: Payer: Self-pay | Admitting: Pulmonary Disease

## 2023-04-11 MED ORDER — BREZTRI AEROSPHERE 160-9-4.8 MCG/ACT IN AERO: 2.0000 | INHALATION_SPRAY | Freq: Two times a day (BID) | RESPIRATORY_TRACT | 3 refills | Status: AC

## 2023-04-19 ENCOUNTER — Other Ambulatory Visit: Payer: Self-pay | Admitting: Family Medicine

## 2023-04-19 ENCOUNTER — Other Ambulatory Visit: Payer: Self-pay | Admitting: Internal Medicine

## 2023-04-19 DIAGNOSIS — E039 Hypothyroidism, unspecified: Secondary | ICD-10-CM

## 2023-04-19 DIAGNOSIS — J4520 Mild intermittent asthma, uncomplicated: Secondary | ICD-10-CM

## 2023-04-19 DIAGNOSIS — K219 Gastro-esophageal reflux disease without esophagitis: Secondary | ICD-10-CM

## 2023-04-26 ENCOUNTER — Other Ambulatory Visit: Payer: Self-pay | Admitting: Family Medicine

## 2023-04-26 DIAGNOSIS — F39 Unspecified mood [affective] disorder: Secondary | ICD-10-CM

## 2023-04-27 ENCOUNTER — Other Ambulatory Visit: Payer: Self-pay | Admitting: Family Medicine

## 2023-04-28 ENCOUNTER — Other Ambulatory Visit: Payer: Self-pay | Admitting: Family Medicine

## 2023-04-28 DIAGNOSIS — F5101 Primary insomnia: Secondary | ICD-10-CM

## 2023-05-07 ENCOUNTER — Encounter: Payer: Medicare Other | Attending: Pulmonary Disease | Admitting: *Deleted

## 2023-05-07 DIAGNOSIS — R0600 Dyspnea, unspecified: Secondary | ICD-10-CM

## 2023-05-07 DIAGNOSIS — J45901 Unspecified asthma with (acute) exacerbation: Secondary | ICD-10-CM

## 2023-05-07 NOTE — Progress Notes (Signed)
 Initial phone call completed. Diagnosis can be found in [facility] 2/13. EP Orientation scheduled for Wednesday 4/2 at 1pm.

## 2023-05-08 ENCOUNTER — Ambulatory Visit: Payer: Medicare Other

## 2023-05-10 ENCOUNTER — Telehealth: Payer: Self-pay | Admitting: Family Medicine

## 2023-05-10 NOTE — Telephone Encounter (Signed)
 Copied from CRM [id number]. Topic: Medicare AWV >> May 10, 2023 10:36 AM Payton Doughty wrote: Reason for CRM: LVM 05/10/2023 to r/s AWV appt due to [facility] out of office. New AWV appt date 07/23/2023 at 2209pm. Please confirm date change  Verlee Rossetti; Care Guide Ambulatory Clinical Support [place] [facility] Direct Dial: [phone]

## 2023-05-16 ENCOUNTER — Emergency Department

## 2023-05-16 ENCOUNTER — Ambulatory Visit: Payer: Self-pay | Admitting: Family Medicine

## 2023-05-16 ENCOUNTER — Inpatient Hospital Stay
Admission: EM | Admit: 2023-05-16 | Discharge: 2023-05-19 | DRG: 149 | Disposition: A | Discharge status: Skilled Nursing Facility | Attending: Internal Medicine | Admitting: Internal Medicine

## 2023-05-16 ENCOUNTER — Other Ambulatory Visit: Payer: Self-pay

## 2023-05-16 DIAGNOSIS — J4489 Other specified chronic obstructive pulmonary disease: Secondary | ICD-10-CM | POA: Diagnosis present

## 2023-05-16 DIAGNOSIS — R270 Ataxia, unspecified: Secondary | ICD-10-CM | POA: Diagnosis not present

## 2023-05-16 DIAGNOSIS — E039 Hypothyroidism, unspecified: Secondary | ICD-10-CM | POA: Diagnosis not present

## 2023-05-16 DIAGNOSIS — K219 Gastro-esophageal reflux disease without esophagitis: Secondary | ICD-10-CM | POA: Diagnosis present

## 2023-05-16 DIAGNOSIS — Z888 Allergy status to other drugs, medicaments and biological substances status: Secondary | ICD-10-CM

## 2023-05-16 DIAGNOSIS — Z7982 Long term (current) use of aspirin: Secondary | ICD-10-CM | POA: Diagnosis not present

## 2023-05-16 DIAGNOSIS — Z79899 Other long term (current) drug therapy: Secondary | ICD-10-CM | POA: Diagnosis not present

## 2023-05-16 DIAGNOSIS — Z880 Allergy status to penicillin: Secondary | ICD-10-CM

## 2023-05-16 DIAGNOSIS — H8110 Benign paroxysmal vertigo, unspecified ear: Secondary | ICD-10-CM | POA: Diagnosis not present

## 2023-05-16 DIAGNOSIS — I6381 Other cerebral infarction due to occlusion or stenosis of small artery: Secondary | ICD-10-CM

## 2023-05-16 DIAGNOSIS — F419 Anxiety disorder, unspecified: Secondary | ICD-10-CM | POA: Diagnosis present

## 2023-05-16 DIAGNOSIS — Z961 Presence of intraocular lens: Secondary | ICD-10-CM | POA: Diagnosis present

## 2023-05-16 DIAGNOSIS — M810 Age-related osteoporosis without current pathological fracture: Secondary | ICD-10-CM | POA: Diagnosis not present

## 2023-05-16 DIAGNOSIS — Z8739 Personal history of other diseases of the musculoskeletal system and connective tissue: Secondary | ICD-10-CM | POA: Diagnosis present

## 2023-05-16 DIAGNOSIS — I10 Essential (primary) hypertension: Secondary | ICD-10-CM | POA: Diagnosis not present

## 2023-05-16 DIAGNOSIS — J4550 Severe persistent asthma, uncomplicated: Secondary | ICD-10-CM | POA: Diagnosis not present

## 2023-05-16 DIAGNOSIS — Z96652 Presence of left artificial knee joint: Secondary | ICD-10-CM | POA: Diagnosis not present

## 2023-05-16 DIAGNOSIS — R42 Dizziness and giddiness: Secondary | ICD-10-CM

## 2023-05-16 DIAGNOSIS — G4733 Obstructive sleep apnea (adult) (pediatric): Secondary | ICD-10-CM | POA: Diagnosis not present

## 2023-05-16 DIAGNOSIS — Z7983 Long term (current) use of bisphosphonates: Secondary | ICD-10-CM | POA: Diagnosis not present

## 2023-05-16 DIAGNOSIS — Z8249 Family history of ischemic heart disease and other diseases of the circulatory system: Secondary | ICD-10-CM

## 2023-05-16 DIAGNOSIS — Z1152 Encounter for screening for COVID-19: Secondary | ICD-10-CM

## 2023-05-16 DIAGNOSIS — E7800 Pure hypercholesterolemia, unspecified: Secondary | ICD-10-CM | POA: Diagnosis not present

## 2023-05-16 DIAGNOSIS — Z9841 Cataract extraction status, right eye: Secondary | ICD-10-CM | POA: Diagnosis not present

## 2023-05-16 DIAGNOSIS — M1711 Unilateral primary osteoarthritis, right knee: Secondary | ICD-10-CM | POA: Diagnosis not present

## 2023-05-16 DIAGNOSIS — Z79890 Hormone replacement therapy (postmenopausal): Secondary | ICD-10-CM

## 2023-05-16 DIAGNOSIS — R29818 Other symptoms and signs involving the nervous system: Secondary | ICD-10-CM | POA: Diagnosis not present

## 2023-05-16 DIAGNOSIS — G252 Other specified forms of tremor: Secondary | ICD-10-CM | POA: Diagnosis present

## 2023-05-16 DIAGNOSIS — Z8673 Personal history of transient ischemic attack (TIA), and cerebral infarction without residual deficits: Secondary | ICD-10-CM

## 2023-05-16 DIAGNOSIS — I951 Orthostatic hypotension: Secondary | ICD-10-CM | POA: Diagnosis present

## 2023-05-16 DIAGNOSIS — M1990 Unspecified osteoarthritis, unspecified site: Secondary | ICD-10-CM | POA: Diagnosis present

## 2023-05-16 DIAGNOSIS — R531 Weakness: Secondary | ICD-10-CM | POA: Diagnosis not present

## 2023-05-16 MED ORDER — MECLIZINE HCL 25 MG PO TABS
12.5000 mg | ORAL_TABLET | Freq: Once | ORAL | Status: AC
  Administered 2023-05-16: 12.5 mg via ORAL
  Filled 2023-05-16: qty 1

## 2023-05-16 MED ORDER — SODIUM CHLORIDE 0.9 % IV BOLUS
500.0000 mL | Freq: Once | INTRAVENOUS | Status: AC
  Administered 2023-05-16: 500 mL via INTRAVENOUS

## 2023-05-16 NOTE — ED Triage Notes (Signed)
 BIB EMS for generalized weakness, new onset tremors, and loss of appetite x 2-3 days. Denies any fevers or worsening cough.

## 2023-05-16 NOTE — ED Provider Notes (Incomplete)
 [facility] Provider Note    Event Date/Time   First MD Initiated Contact with Patient 05/16/23 2336     (approximate)   History   Weakness and Tremors   HPI  Dorothy Church is a 88 y.o. female   Past medical history of depression, GERD, osteoporosis, hyperlipidemia, who is here with 4 days of dizziness, gait instability, nausea.  She has no other acute medical complaints.  She denies headache, chest pain, shortness of breath, respiratory infectious symptoms, urinary symptoms.  She does not drink alcohol.  She has had no recent changes in her medications.  She also has some "shakiness" and noticed new onset of tremors in bilateral hands.  Lives alone.  Over the last several days she has had hold onto furniture to get from place to place.  She normally walks without assistance.  She feels the symptoms worse when she gets up to try to ambulate but they are also present at rest without any movement or head movements at all.  They do not appear to be worsened by head movement.  External Medical Documents Reviewed: Pulmonary disease note from February 2025 documented past medical history of moderate asthma      Physical Exam   Triage Vital Signs: ED Triage Vitals [05/16/23 2334]  Encounter Vitals Group     BP (!) 156/82     Systolic BP Percentile      Diastolic BP Percentile      Pulse Rate 84     Resp 18     Temp 97.9 F (36.6 C)     Temp Source Oral     SpO2 93 %     Weight      Height      Head Circumference      Peak Flow      Pain Score 0     Pain Loc      Pain Education      Exclude from Growth Chart     Most recent vital signs: Vitals:   05/16/23 2334  BP: (!) 156/82  Pulse: 84  Resp: 18  Temp: 97.9 F (36.6 C)  SpO2: 93%    General: Awake, no distress.  CV:  Good peripheral perfusion.  Resp:  Normal effort.  Abd:  No distention.  Other:  Pleasant woman in no acute distress slightly hypertensive otherwise vital signs  are normal.  She had does have a coarse tremor of bilateral upper extremities that does not extinguish with movement.  She has no rigidity or clonus.  She is able to range all extremities with full active range of motion and has good strength to bilateral upper and lower extremities and sensation intact throughout.  She has no facial asymmetry or dysarthria.  She has a prosthetic left eye and there is no noted nystagmus of the right eye.  Her neck is supple with full range of motion.  However upon standing she reaches out to grab me right away and takes several wide-based steps and has to sit down due to the sense that she will fall down.   ED Results / Procedures / Treatments   Labs (all labs ordered are listed, but only abnormal results are displayed) Labs Reviewed  RESP PANEL BY RT-PCR (RSV, FLU A&B, COVID)  [id number]  COMPREHENSIVE METABOLIC PANEL WITH GFR  CBC WITH DIFFERENTIAL/PLATELET  URINALYSIS, W/ REFLEX TO CULTURE (INFECTION SUSPECTED)  TSH  T4, FREE  TROPONIN I (HIGH SENSITIVITY)     I  ordered and reviewed the above labs they are notable for ***  EKG  ED ECG REPORT I, Pilar Jarvis, the attending physician, personally viewed and interpreted this ECG.   Date: 05/16/2023  EKG Time: 2337  Rate: 82  Rhythm: sinus  Axis: nl  Intervals:rbbb  ST&T Change: no stemi    RADIOLOGY I independently reviewed and interpreted Chest x-ray and I see no obvious focality pneumothorax I also reviewed radiologist's formal read.   PROCEDURES:  Critical Care performed: {CriticalCareYesNo:19197::"Yes, see critical care procedure note(s)","No"}  Procedures   MEDICATIONS ORDERED IN ED: Medications  sodium chloride 0.9 % bolus 500 mL (has no administration in time range)  meclizine (ANTIVERT) tablet 12.5 mg (has no administration in time range)    External physician / consultants:  I spoke with *** regarding care plan for this patient.   IMPRESSION / MDM / ASSESSMENT AND PLAN / ED  COURSE  I reviewed the triage vital signs and the nursing notes.                                Patient's presentation is most consistent with acute presentation with potential threat to life or bodily function.  Differential diagnosis includes, but is not limited to, central versus peripheral vertigo, dissection, stroke, electrolyte derangement   The patient is on the cardiac monitor to evaluate for evidence of arrhythmia and/or significant heart rate changes.  MDM:    Is a patient with coarse tremors and dizziness/unsteady gait found to be ataxic I am concerned she has a stroke.  Not a stroke code due to onset of symptoms 4 days ago.  Her only neurologic deficit is the ataxia.  She does have a coarse tremor bilateral upper extremities.  I do not think she has serotonin syndrome or NMS.  She has no other acute medical complaints and feels well.  She has a soft benign abdominal exam and think the nausea is associated with her dizziness and not intra-abdominal pathologies.  Will get a stroke workup including CT angiogram of the head, MRI, basic labs, and anticipate admission for further stroke workup or rehab/assistance at home due to her inability to walk.       FINAL CLINICAL IMPRESSION(S) / ED DIAGNOSES   Final diagnoses:  Ataxia  Coarse tremors     Rx / DC Orders   ED Discharge Orders     None        Note:  This document was prepared using Dragon voice recognition software and may include unintentional dictation errors.

## 2023-05-16 NOTE — Telephone Encounter (Signed)
 Copied from CRM [id number]. Topic: Clinical - Red Word Triage >> May 16, 2023  1:04 PM Gurney Maxin H wrote: [facility] that prompted transfer to Nurse Triage: Nausea, light heading feeling spacey, weak  Chief Complaint: Lightheadedness Symptoms: Lack of appetite, feels shaky, feels spacey Frequency: 3-4 days Pertinent Negatives: Patient denies fever Disposition: [] ED /[] Urgent Care (no appt availability in office) / [x] Appointment(In office/virtual)/ []  [facility]/ [] Home Care/ [] Refused Recommended Disposition /[] [facility]/ []  Follow-up with PCP Additional Notes: Patient called in to report lightheadedness that started 3-4 days ago. Patient stated lightheadedness is accompanied by lack of appetite, shakiness and feeling spacey. Patient denied fainting/falls. Patient denied the following symptoms: headache, changes in speech/vision, chest pain and weakness. Patient stated she does not have a means to check her BP at home. This RN advised patient to see a provider within 24 hours, per protocol. This RN scheduled patient with PCP tomorrow morning. This RN advised patient to call back if symptoms worsen. Patient complied.  Reason for Disposition  [1] MODERATE dizziness (e.g., interferes with normal activities) AND [2] has NOT been evaluated by doctor (or NP/PA) for this  (Exception: Dizziness caused by heat exposure, sudden standing, or poor fluid intake.)  Answer Assessment - Initial Assessment Questions 1. DESCRIPTION: "Describe your dizziness."     Denies "usual" dizzy symptoms 2. LIGHTHEADED: "Do you feel lightheaded?" (e.g., somewhat faint, woozy, weak upon standing)     States she feels "tippy"/off balance  3. VERTIGO: "Do you feel like either you or the room is spinning or tilting?" (i.e. vertigo)     Denies 4. SEVERITY: "How bad is it?"  "Do you feel like you are going to faint?" "Can you stand and walk?"   - MILD: Feels slightly dizzy, but walking normally.   -  MODERATE: Feels unsteady when walking, but not falling; interferes with normal activities (e.g., school, work).   - SEVERE: Unable to walk without falling, or requires assistance to walk without falling; feels like passing out now.      States she can walk around normally if she's careful, denies fainting/falling 5. ONSET:  "When did the dizziness begin?"     3-4 days 6. AGGRAVATING FACTORS: "Does anything make it worse?" (e.g., standing, change in head position)     When she lays down and gets up, bending over  7. HEART RATE: "Can you tell me your heart rate?" "How many beats in 15 seconds?"  (Note: not all patients can do this)       N/A 8. CAUSE: "What do you think is causing the dizziness?"     Unknown 9. RECURRENT SYMPTOM: "Have you had dizziness before?" If Yes, ask: "When was the last time?" "What happened that time?"     States this has happened before to a lesser degree 10. OTHER SYMPTOMS: "Do you have any other symptoms?" (e.g., fever, chest pain, vomiting, diarrhea, bleeding)      Lack of appetite, difficulty swallowing (chronic), feels spacey, feels shaky, denies headache, denies fever, denies changes in speech, denies chest pain, denies weakness, chronic asthma, states she has not checked BP but it typically runs low  Protocols used: Dizziness - Lightheadedness-A-AH

## 2023-05-16 NOTE — Telephone Encounter (Signed)
 FYI pt has an appointment tomorrow 05/17/2023 @9 :40

## 2023-05-17 ENCOUNTER — Ambulatory Visit: Admitting: Nurse Practitioner

## 2023-05-17 ENCOUNTER — Other Ambulatory Visit: Payer: Self-pay

## 2023-05-17 ENCOUNTER — Emergency Department

## 2023-05-17 ENCOUNTER — Ambulatory Visit: Admitting: Family Medicine

## 2023-05-17 DIAGNOSIS — R42 Dizziness and giddiness: Secondary | ICD-10-CM

## 2023-05-17 LAB — CBC WITH DIFFERENTIAL/PLATELET
Abs Immature Granulocytes: 0.03 10*3/uL (ref 0.00–0.07)
Basophils Absolute: 0 10*3/uL (ref 0.0–0.1)
Basophils Relative: 1 %
Eosinophils Absolute: 0.3 10*3/uL (ref 0.0–0.5)
Eosinophils Relative: 4 %
HCT: 37 % (ref 36.0–46.0)
Hemoglobin: 12.6 g/dL (ref 12.0–15.0)
Immature Granulocytes: 0 %
Lymphocytes Relative: 14 %
Lymphs Abs: 1 10*3/uL (ref 0.7–4.0)
MCH: 30.1 pg (ref 26.0–34.0)
MCHC: 34.1 g/dL (ref 30.0–36.0)
MCV: 88.5 fL (ref 80.0–100.0)
Monocytes Absolute: 0.7 10*3/uL (ref 0.1–1.0)
Monocytes Relative: 9 %
Neutro Abs: 5.1 10*3/uL (ref 1.7–7.7)
Neutrophils Relative %: 72 %
Platelets: 215 10*3/uL (ref 150–400)
RBC: 4.18 MIL/uL (ref 3.87–5.11)
RDW: 12.7 % (ref 11.5–15.5)
WBC: 7.1 10*3/uL (ref 4.0–10.5)
nRBC: 0 % (ref 0.0–0.2)

## 2023-05-17 LAB — RESP PANEL BY RT-PCR (RSV, FLU A&B, COVID)  RVPGX2
Influenza A by PCR: NEGATIVE
Influenza B by PCR: NEGATIVE
Resp Syncytial Virus by PCR: NEGATIVE
SARS Coronavirus 2 by RT PCR: NEGATIVE

## 2023-05-17 LAB — URINALYSIS, W/ REFLEX TO CULTURE (INFECTION SUSPECTED)
Bacteria, UA: NONE SEEN
Bilirubin Urine: NEGATIVE
Glucose, UA: NEGATIVE mg/dL
Ketones, ur: NEGATIVE mg/dL
Leukocytes,Ua: NEGATIVE
Nitrite: NEGATIVE
Protein, ur: NEGATIVE mg/dL
Specific Gravity, Urine: 1.01 (ref 1.005–1.030)
Squamous Epithelial / HPF: 0 /HPF (ref 0–5)
WBC, UA: 0 WBC/hpf (ref 0–5)
pH: 7 (ref 5.0–8.0)

## 2023-05-17 LAB — LIPID PANEL
Cholesterol: 142 mg/dL (ref 0–200)
HDL: 54 mg/dL (ref >40)
LDL Cholesterol: 75 mg/dL (ref 0–99)
Total CHOL/HDL Ratio: 2.6 ratio
Triglycerides: 67 mg/dL (ref <150)
VLDL: 13 mg/dL (ref 0–40)

## 2023-05-17 LAB — COMPREHENSIVE METABOLIC PANEL WITH GFR
ALT: 10 U/L (ref 0–44)
AST: 14 U/L — ABNORMAL LOW (ref 15–41)
Albumin: 3.6 g/dL (ref 3.5–5.0)
Alkaline Phosphatase: 39 U/L (ref 38–126)
Anion gap: 8 (ref 5–15)
BUN: 10 mg/dL (ref 8–23)
CO2: 25 mmol/L (ref 22–32)
Calcium: 9.3 mg/dL (ref 8.9–10.3)
Chloride: 106 mmol/L (ref 98–111)
Creatinine, Ser: 0.72 mg/dL (ref 0.44–1.00)
GFR, Estimated: >60 mL/min (ref >60)
Glucose, Bld: 107 mg/dL — ABNORMAL HIGH (ref 70–99)
Potassium: 3.6 mmol/L (ref 3.5–5.1)
Sodium: 139 mmol/L (ref 135–145)
Total Bilirubin: 0.6 mg/dL (ref 0.0–1.2)
Total Protein: 6.2 g/dL — ABNORMAL LOW (ref 6.5–8.1)

## 2023-05-17 LAB — TSH: TSH: 0.916 u[IU]/mL (ref 0.350–4.500)

## 2023-05-17 LAB — TROPONIN I (HIGH SENSITIVITY)
Troponin I (High Sensitivity): 5 ng/L (ref <18)
Troponin I (High Sensitivity): 5 ng/L (ref <18)

## 2023-05-17 LAB — HEMOGLOBIN A1C
Hgb A1c MFr Bld: 5.1 % (ref 4.8–5.6)
Mean Plasma Glucose: 99.67 mg/dL

## 2023-05-17 LAB — T4, FREE: Free T4: 1.72 ng/dL — ABNORMAL HIGH (ref 0.61–1.12)

## 2023-05-17 MED ORDER — ALBUTEROL SULFATE (2.5 MG/3ML) 0.083% IN NEBU: 2.5000 mg | INHALATION_SOLUTION | Freq: Four times a day (QID) | RESPIRATORY_TRACT | Status: DC | PRN

## 2023-05-17 MED ORDER — ENOXAPARIN SODIUM 40 MG/0.4ML IJ SOSY
40.0000 mg | PREFILLED_SYRINGE | INTRAMUSCULAR | Status: DC
Start: 2023-05-17 — End: 2023-05-19
  Administered 2023-05-17 – 2023-05-19 (×3): 40 mg via SUBCUTANEOUS
  Filled 2023-05-17 (×4): qty 0.4

## 2023-05-17 MED ORDER — ACETAMINOPHEN 325 MG PO TABS
650.0000 mg | ORAL_TABLET | Freq: Once | ORAL | Status: AC
  Administered 2023-05-17: 650 mg via ORAL
  Filled 2023-05-17: qty 2

## 2023-05-17 MED ORDER — MECLIZINE HCL 25 MG PO TABS: 25.0000 mg | ORAL_TABLET | Freq: Three times a day (TID) | ORAL | Status: DC | PRN

## 2023-05-17 MED ORDER — STROKE: EARLY STAGES OF RECOVERY BOOK: Freq: Once | Status: AC

## 2023-05-17 MED ORDER — BUDESON-GLYCOPYRROL-FORMOTEROL 160-9-4.8 MCG/ACT IN AERO: 2.0000 | INHALATION_SPRAY | Freq: Two times a day (BID) | RESPIRATORY_TRACT | Status: DC

## 2023-05-17 MED ORDER — ONDANSETRON HCL 4 MG/2ML IJ SOLN
4.0000 mg | Freq: Once | INTRAMUSCULAR | Status: AC
  Administered 2023-05-17: 4 mg via INTRAVENOUS
  Filled 2023-05-17: qty 2

## 2023-05-17 MED ORDER — ONDANSETRON HCL 4 MG/2ML IJ SOLN
4.0000 mg | Freq: Four times a day (QID) | INTRAMUSCULAR | Status: DC | PRN
  Administered 2023-05-17: 4 mg via INTRAVENOUS
  Filled 2023-05-17: qty 2

## 2023-05-17 MED ORDER — UMECLIDINIUM BROMIDE 62.5 MCG/ACT IN AEPB
1.0000 | INHALATION_SPRAY | Freq: Every day | RESPIRATORY_TRACT | Status: DC
  Administered 2023-05-17 – 2023-05-19 (×3): 1 via RESPIRATORY_TRACT
  Filled 2023-05-17: qty 7

## 2023-05-17 MED ORDER — ACETAMINOPHEN 500 MG PO TABS: 1000.0000 mg | ORAL_TABLET | Freq: Three times a day (TID) | ORAL | Status: DC | PRN

## 2023-05-17 MED ORDER — IOHEXOL 350 MG/ML SOLN
75.0000 mL | Freq: Once | INTRAVENOUS | Status: AC | PRN
  Administered 2023-05-17: 75 mL via INTRAVENOUS

## 2023-05-17 MED ORDER — ONDANSETRON HCL 4 MG PO TABS: 4.0000 mg | ORAL_TABLET | Freq: Four times a day (QID) | ORAL | Status: DC | PRN

## 2023-05-17 MED ORDER — PANTOPRAZOLE SODIUM 40 MG PO TBEC
40.0000 mg | DELAYED_RELEASE_TABLET | Freq: Every day | ORAL | Status: DC
Start: 2023-05-17 — End: 2023-05-19
  Administered 2023-05-17 – 2023-05-19 (×3): 40 mg via ORAL
  Filled 2023-05-17 (×3): qty 1

## 2023-05-17 MED ORDER — ASPIRIN 81 MG PO TBEC
81.0000 mg | DELAYED_RELEASE_TABLET | Freq: Every day | ORAL | Status: DC
Start: 2023-05-17 — End: 2023-05-19
  Administered 2023-05-17 – 2023-05-19 (×3): 81 mg via ORAL
  Filled 2023-05-17 (×3): qty 1

## 2023-05-17 MED ORDER — FLUTICASONE FUROATE-VILANTEROL 100-25 MCG/ACT IN AEPB
1.0000 | INHALATION_SPRAY | Freq: Every day | RESPIRATORY_TRACT | Status: DC
  Administered 2023-05-17 – 2023-05-19 (×3): 1 via RESPIRATORY_TRACT
  Filled 2023-05-17: qty 28

## 2023-05-17 MED ORDER — HYDROXYZINE HCL 50 MG PO TABS
25.0000 mg | ORAL_TABLET | Freq: Three times a day (TID) | ORAL | Status: DC | PRN
  Administered 2023-05-17 – 2023-05-18 (×3): 25 mg via ORAL
  Filled 2023-05-17 (×4): qty 1

## 2023-05-17 MED ORDER — SIMVASTATIN 20 MG PO TABS
20.0000 mg | ORAL_TABLET | Freq: Every day | ORAL | Status: DC
  Administered 2023-05-17 – 2023-05-18 (×2): 20 mg via ORAL
  Filled 2023-05-17 (×3): qty 1

## 2023-05-17 MED ORDER — LEVOTHYROXINE SODIUM 50 MCG PO TABS
50.0000 ug | ORAL_TABLET | Freq: Every day | ORAL | Status: DC
  Administered 2023-05-17 – 2023-05-19 (×3): 50 ug via ORAL
  Filled 2023-05-17 (×3): qty 1

## 2023-05-17 MED ORDER — ALUM & MAG HYDROXIDE-SIMETH 200-200-20 MG/5ML PO SUSP
15.0000 mL | Freq: Four times a day (QID) | ORAL | Status: DC | PRN
  Administered 2023-05-19: 15 mL via ORAL
  Filled 2023-05-17: qty 30

## 2023-05-17 NOTE — ED Notes (Signed)
 Pt reported she feels better and is less anxious.

## 2023-05-17 NOTE — Assessment & Plan Note (Signed)
 Continue levothyroxine

## 2023-05-17 NOTE — ED Notes (Signed)
 Pt reported having anxiety and restlessness. PRN medication orders given

## 2023-05-17 NOTE — Progress Notes (Signed)
 SLP Cancellation Note  Patient Details Name: Dorothy Church MRN: [id number] DOB: 1935/09/26   Cancelled treatment:       Reason BSE Not Completed: SLP screened, no needs identified. Pt passed RN swallow screen and has been placed on regular diet. She was observed drinking water via straw without difficulty. Please reconsult if needs arise.  Dorothy Church, [facility], CCC-SLP Speech Language Pathologist  [facility] 05/17/2023, 1:07 PM

## 2023-05-17 NOTE — ED Notes (Addendum)
 Orthostatic Vitals  Laying 139/95 BP, 90 PR, 91%, 25 RR  Sitting 123/74 BP, 96 PR, 93%, 18 RR  Standing 127/64 BP, 107 PR, 91%, 20 RR

## 2023-05-17 NOTE — ED Notes (Signed)
 The patient called out on her call bell at this time. Upon this RN entering the room, the patient stated that she felt like her tremors where worse. This RN did a repeat NIHSS and noted a new left facial droop. Jon Billings, NP paged at this time. Will continue to monitor.

## 2023-05-17 NOTE — TOC Initial Note (Signed)
 Transition of Care [facility]) - Initial/Assessment Note    Patient Details  Name: Dorothy Church MRN: [id number] Date of Birth: 02-10-1936  [facility] CM/SW Contact:    Elberta Fortis, RN Phone Number: 05/17/2023, 3:13 PM  Clinical Narrative:  Met with pt in her hospital room. For DC she wants to go to [facility] as she's already a resident in their independent living side. She doesn't normally require DME and has a walker just in case. She doesn't drive and her son lives in [place] and daughter in [place]. She's able to obtain and take her medications, doesn't drive any longer. She verbalizes anxiety and possible depression related to being alone and watching the news. TOC to continue to follow.                  Expected Discharge Plan: Assisted Living Barriers to Discharge: Continued Medical Work up   Patient Goals and CMS Choice            Expected Discharge Plan and Services   Discharge Planning Services: CM Consult Post Acute Care Choice: Skilled Nursing Facility (Wants to go to [facility] or respite) Living arrangements for the past 2 months: Independent Living Facility                                      Prior Living Arrangements/Services Living arrangements for the past 2 months: Independent Living Facility Lives with:: Self Patient language and need for interpreter reviewed:: Yes Do you feel safe going back to the place where you live?: Yes      Need for Family Participation in Patient Care: Yes (Comment) Care giver support system in place?: No (comment) Current home services:  (none) Criminal Activity/Legal Involvement Pertinent to Current Situation/Hospitalization: No - Comment as needed  Activities of Daily Living      Permission Sought/Granted                  Emotional Assessment Appearance:: Appears stated age Attitude/Demeanor/Rapport: Gracious Affect (typically observed): Anxious, Pleasant Orientation: :  Oriented to Self, Oriented to Place, Oriented to  Time, Oriented to Situation Alcohol / Substance Use: Never Used Psych Involvement: No (comment)  Admission diagnosis:  Dizziness [R42] Patient Active Problem List   Diagnosis Date Noted   Dizziness 05/17/2023   Impaired fasting glucose 06/17/2022   BPPV (benign paroxysmal positional vertigo) 03/13/2022   Allergic rhinitis 03/10/2021   Gastroesophageal reflux disease 09/28/2020   Gastric polyp 04/28/2020   Lower esophageal ring ([place]) 04/28/2020   Insomnia 12/30/2018   Hiatal hernia    Mood disorder (HCC) 03/11/2017   Asthma, chronic, severe persistent, uncomplicated 05/11/2016   OA (osteoarthritis) 04/04/2015   Osteoporosis 10/07/2012   Vitamin D deficiency    Hypothyroidism    Hyperlipidemia 08/07/2012   Obstructive sleep apnea on CPAP 08/07/2012   PCP:  Dana Allan, MD Pharmacy:   [facility] [phone] Nicholes Rough, [facility] - [address] [place] [address] Phone: [phone] Fax: [phone]  [facility] - [place], [place] - [place] [address] [place] [address] [place] [address] Phone: [phone] Fax: [phone]     Social Drivers of Health (SDOH) Social History: SDOH Screenings   Food Insecurity: No Food Insecurity (03/21/2023)  Housing: Unknown (03/21/2023)  Transportation Needs: No Transportation Needs (03/21/2023)  Utilities: Not At Risk (06/14/2022)  Alcohol  Screen: Low Risk  (03/21/2023)  Depression (PHQ2-9): Medium Risk (10/08/2022)  Financial Resource Strain: Low Risk  (03/21/2023)  Physical Activity: Inactive (03/21/2023)  Social Connections: Moderately Integrated (03/21/2023)  Stress: Stress Concern Present (03/21/2023)  Tobacco Use: Low Risk  (05/16/2023)   SDOH Interventions:     Readmission Risk Interventions     No data to display

## 2023-05-17 NOTE — Assessment & Plan Note (Signed)
 CPAP nightly

## 2023-05-17 NOTE — Assessment & Plan Note (Signed)
 Follows with endocrinology.  On Reclast infusions

## 2023-05-17 NOTE — H&P (Addendum)
 History and Physical    Patient: Dorothy Church GNF:[id number] DOB: Oct 06, 1935 DOA: 05/16/2023 DOS: the patient was seen and examined on 05/17/2023 PCP: Dana Allan, MD  Patient coming from: Home  Chief Complaint:  Chief Complaint  Patient presents with   Weakness   Tremors    HPI: Dorothy Church is a 88 y.o. female with medical history significant for asthma, OSA on CPAP, followed by pulmonology, hypothyroidism generalized osteoarthritis, osteoporosis on Reclast infusions who was brought in by EMS with generalized weakness, dizziness, tremors and imbalance.  She denies headache, vision change, one-sided weakness numbness or tingling. She denies cough, fever or chills or shortness of breath.  Denies nausea or vomiting, change in bowel habits or dysuria.  She endorses posterior neck discomfort that started upon arrival to the ED. Review of PCP note in August 2024 also showed presence of tremors that were attributed to side effect of trazodone which was subsequently discontinued. .  ED course and data review: Vitals within normal limits. Labs including CMP, CBC, UA, troponin, TSH and respiratory viral panel for the most part unremarkable EKG, personally viewed and interpreted showing sinus at 82 with RBBB CTA head and neck without LVO but showing age-indeterminate small lacunar infarct left basal ganglia MRI shows no acute intracranial abnormality Patient treated with meclizine, Zofran and an NS bolus for dizziness with little improvement in her dizziness Hospitalist consulted for admission due to  concern for safe discharge as patient lives alone.   Review of Systems: As mentioned in the history of present illness. All other systems reviewed and are negative.  Past Medical History:  Diagnosis Date   Abdominal pain    Abnormal CT of liver    Accident due to mechanical fall without injury 01/28/2022   Acute pain of left shoulder 09/28/2020   Anxiety    Anxiety 04/28/2020   Aortic  stenosis    Aortic stenosis 03/04/2012   Overview:   Not confirmed by Dr Mariah Milling.    Aortic valve disorders    Arthritis    Asthma    Asthma 08/07/2012   Barrett's esophagus    [facility] last EGD 02/2018 Dr. Marva Panda    Barrett's esophagus 03/11/2017   Overview:  Dr Marva Panda   Carotid artery stenosis 12/30/2018   1-39% b/l carotids       Colon polyps    Colon polyps    COPD (chronic obstructive pulmonary disease) (HCC)    DDD (degenerative disc disease), cervical    DDD (degenerative disc disease), cervical 04/28/2020   DDD (degenerative disc disease), lumbar 01/22/2017   Depressive disorder    Diverticulosis    Erosive esophagitis    Erosive esophagitis 04/28/2020   Fundic gland polyps of stomach, benign    Gastric polyp    Gastric polyps    Gastritis    Generalized osteoarthritis of hand 05/03/2015   GERD (gastroesophageal reflux disease)    GOA (generalized osteoarthritis) 11/23/2015   Goiter    Hiatal hernia    small    Hiatal hernia    History of colonic polyps 12/08/2013   Hypercholesteremia    Hypothyroidism    Impaired fasting glucose    Impaired fasting glucose 03/04/2012   Overview:   Glucose 111 02/2012   Intrinsic asthma, unspecified    Joint pain of ankle and foot, right 05/17/2015   Left rib fracture    Mechanical complication of knee prosthesis, initial encounter (HCC) 08/25/2017   Obstructive sleep apnea (adult) (pediatric)  used cpap x 10 years per Dr. Belia Heman off as of 12/30/18    OSA (obstructive sleep apnea)    used cpap x 10 years per Dr. 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   Stress 04/28/2020   Syncope and collapse    TIA (transient ischemic attack)    TIA  (transient ischemic attack) 12/11/2016   TIA (transient ischemic attack) 12/11/2016   UTI (urinary tract infection)    Vitamin D deficiency    Past Surgical History:  Procedure Laterality Date   BREAST EXCISIONAL BIOPSY Left    BREAST SURGERY     CATARACT EXTRACTION W/PHACO Right 11/07/2021   Procedure: CATARACT EXTRACTION PHACO AND INTRAOCULAR LENS PLACEMENT (IOC) RIGHT  8.87 00:50.8;  Surgeon: Galen Manila, MD;  Location: [facility];  Service: Ophthalmology;  Laterality: Right;   COLONOSCOPY     COLONOSCOPY WITH ESOPHAGOGASTRODUODENOSCOPY (EGD)     COLONOSCOPY WITH PROPOFOL N/A 12/14/2014   Procedure: COLONOSCOPY WITH PROPOFOL;  Surgeon: Christena Deem, MD;  Location: [facility] ENDOSCOPY;  Service: Endoscopy;  Laterality: N/A;   COLONOSCOPY WITH PROPOFOL N/A 03/18/2018   Procedure: COLONOSCOPY WITH PROPOFOL;  Surgeon: Christena Deem, MD;  Location: [facility] ENDOSCOPY;  Service: Endoscopy;  Laterality: N/A;   ENUCLEATION     ESOPHAGOGASTRODUODENOSCOPY N/A 08/30/2020   Procedure: ESOPHAGOGASTRODUODENOSCOPY (EGD);  Surgeon: Regis Bill, MD;  Location: [facility] ENDOSCOPY;  Service: Endoscopy;  Laterality: N/A;   ESOPHAGOGASTRODUODENOSCOPY (EGD) WITH PROPOFOL N/A 12/14/2014   Procedure: ESOPHAGOGASTRODUODENOSCOPY (EGD) WITH PROPOFOL;  Surgeon: Christena Deem, MD;  Location: [facility] ENDOSCOPY;  Service: Endoscopy;  Laterality: N/A;   ESOPHAGOGASTRODUODENOSCOPY (EGD) WITH PROPOFOL N/A 10/18/2016   Procedure: ESOPHAGOGASTRODUODENOSCOPY (EGD) WITH PROPOFOL;  Surgeon: Christena Deem, MD;  Location: [facility] ENDOSCOPY;  Service: Endoscopy;  Laterality: N/A;   ESOPHAGOGASTRODUODENOSCOPY (EGD) WITH PROPOFOL N/A 03/18/2018   Procedure: ESOPHAGOGASTRODUODENOSCOPY (EGD) WITH PROPOFOL;  Surgeon: Christena Deem, MD;  Location: [facility] ENDOSCOPY;  Service: Endoscopy;  Laterality: N/A;   ESOPHAGOGASTRODUODENOSCOPY (EGD) WITH PROPOFOL N/A 12/19/2020   Procedure: ESOPHAGOGASTRODUODENOSCOPY  (EGD) WITH PROPOFOL;  Surgeon: Regis Bill, MD;  Location: [facility] ENDOSCOPY;  Service: Endoscopy;  Laterality: N/A;   EYE SURGERY     FRACTURE SURGERY     HIP SURGERY Left    pin and plate   IR ANGIO INTRA EXTRACRAN SEL COM CAROTID INNOMINATE BILAT MOD SED  10/17/2018   IR ANGIO VERTEBRAL SEL VERTEBRAL BILAT MOD SED  10/17/2018   IR ANGIOGRAM EXTREMITY LEFT  10/17/2018   JOINT REPLACEMENT     Left Total Knee Arthroplasty     Dr Ernest Pine and also had 39 years ago from 2020    nasal polyps     removed x 2 affects sense of smell x 10-15 years from 04/28/20   prosthesis eye implant     REPLACEMENT UNICONDYLAR JOINT KNEE Left    TONSILLECTOMY     TOTAL KNEE REVISION Left 09/13/2017   Procedure: TOTAL KNEE REVISION;  Surgeon: Donato Heinz, MD;  Location: [facility] ORS;  Service: Orthopedics;  Laterality: Left;   Social History:  reports that she has never smoked. She has never used smokeless tobacco. She reports current alcohol  use of about 1.0 standard drink of alcohol per week. She reports that she does not use drugs.  Allergies  Allergen Reactions   Amoxicillin Other (See Comments)    Pencillins   Cefzil [Cefprozil] Other (See Comments)    Unknown   Cephalexin Other (See Comments)    Unknown   Propoxyphene Other (See Comments)    Unknown   Relafen [Nabumetone] Other (See Comments)    Unknown   Talwin [Pentazocine] Other (See Comments)    Unknown   Penicillins Other (See Comments)    Cannot remember reaction    Family History  Problem Relation Age of Onset   Breast cancer Mother        pagets dx 79s   Heart disease Father    Alcohol abuse Father    Dementia Sister        twin sister 15 dx'ed dementia   Bipolar disorder Brother     Prior to Admission medications   Medication Sig Start Date End Date Taking? Authorizing Provider  acetaminophen (TYLENOL) 500 MG tablet Take 1,000 mg by mouth every 8 (eight) hours as needed (PAIN).    [provider]  albuterol  (VENTOLIN HFA) 108 (90 Base) MCG/ACT inhaler Inhale 2 puffs into the lungs every 6 (six) hours as needed for wheezing. 04/04/23   Salena Saner, MD  aspirin EC 81 MG tablet Take 1 tablet (81 mg total) by mouth daily. Swallow whole. 10/17/21   Dana Allan, MD  azelastine (ASTELIN) 0.1 % nasal spray Place 1 spray into both nostrils 2 (two) times daily. Use in each nostril as directed    [provider]  Budeson-Glycopyrrol-Formoterol (BREZTRI AEROSPHERE) 160-9-4.8 MCG/ACT AERO Inhale 2 puffs into the lungs in the morning and at bedtime. 04/11/23   Salena Saner, MD  calcium carbonate (TUMS - DOSED IN MG ELEMENTAL CALCIUM) 500 MG chewable tablet Chew 1 tablet by mouth 4 (four) times daily as needed for indigestion or heartburn.    [provider]  Cholecalciferol (VITAMIN D3) 50 MCG (2000 UT) capsule Take 1 capsule (2,000 Units total) by mouth daily. 10/17/21   Dana Allan, MD  fluticasone (FLONASE) 50 MCG/ACT nasal spray Place into both nostrils daily.    [provider]  latanoprost (XALATAN) 0.005 % ophthalmic solution 1 drop at bedtime.    [provider]  levothyroxine (SYNTHROID) 50 MCG tablet TAKE 1 TABLET BY MOUTH EVERY DAY BEFORE BREAKFAST 11/22/22   Dana Allan, MD  levothyroxine (SYNTHROID) 50 MCG tablet TAKE 1 TABLET BY MOUTH EVERY DAY BEFORE BREAKFAST 04/19/23   Dana Allan, MD  loratadine (CLARITIN) 10 MG tablet TAKE 1 TABLET BY MOUTH EVERY DAY AS NEEDED FOR ALLERGY 03/08/23   Erin Fulling, MD  pantoprazole (PROTONIX) 40 MG tablet TAKE 1 TABLET BY MOUTH EVERY DAY 04/19/23   Dana Allan, MD  PARoxetine (PAXIL) 40 MG tablet TAKE 1 TABLET BY MOUTH EVERY DAY IN THE MORNING 04/26/23   Dana Allan, MD  ramelteon (ROZEREM) 8 MG tablet TAKE 1 TABLET BY MOUTH AT BEDTIME. 04/29/23   Dana Allan, MD  simvastatin (ZOCOR) 20 MG tablet TAKE 1 TABLET BY MOUTH EVERYDAY AT BEDTIME 05/01/23   Dana Allan, MD    Physical Exam: Vitals:   05/16/23 2334  BP: (!)  156/82  Pulse: 84  Resp: 18  Temp: 97.9 F (36.6 C)  TempSrc: Oral  SpO2: 93%   Physical Exam Vitals and nursing note reviewed.  Constitutional:      General: She is  not in acute distress. HENT:     Head: Normocephalic and atraumatic.  Cardiovascular:     Rate and Rhythm: Normal rate and regular rhythm.     Heart sounds: Normal heart sounds.  Pulmonary:     Effort: Pulmonary effort is normal.     Breath sounds: Normal breath sounds.  Abdominal:     Palpations: Abdomen is soft.     Tenderness: There is no abdominal tenderness.  Neurological:     General: No focal deficit present.     Mental Status: Mental status is at baseline.     Labs on Admission: I have personally reviewed following labs and imaging studies  CBC: Recent Labs  Lab 05/16/23 2344  WBC 7.1  NEUTROABS 5.1  HGB 12.6  HCT 37.0  MCV 88.5  PLT 215   Basic Metabolic Panel: Recent Labs  Lab 05/16/23 2344  NA 139  K 3.6  CL 106  CO2 25  GLUCOSE 107*  BUN 10  CREATININE 0.72  CALCIUM 9.3   GFR: CrCl cannot be calculated (Unknown ideal weight.). Liver Function Tests: Recent Labs  Lab 05/16/23 2344  AST 14*  ALT 10  ALKPHOS 39  BILITOT 0.6  PROT 6.2*  ALBUMIN 3.6   No results for input(s): "LIPASE", "AMYLASE" in the last 168 hours. No results for input(s): "AMMONIA" in the last 168 hours. Coagulation Profile: No results for input(s): "INR", "PROTIME" in the last 168 hours. Cardiac Enzymes: No results for input(s): "CKTOTAL", "CKMB", "CKMBINDEX", "TROPONINI" in the last 168 hours. BNP (last 3 results) No results for input(s): "PROBNP" in the last 8760 hours. HbA1C: No results for input(s): "HGBA1C" in the last 72 hours. CBG: No results for input(s): "GLUCAP" in the last 168 hours. Lipid Profile: No results for input(s): "CHOL", "HDL", "LDLCALC", "TRIG", "CHOLHDL", "LDLDIRECT" in the last 72 hours. Thyroid Function Tests: Recent Labs    05/16/23 2344  TSH 0.916  FREET4 1.72*    Anemia Panel: No results for input(s): "VITAMINB12", "FOLATE", "FERRITIN", "TIBC", "IRON", "RETICCTPCT" in the last 72 hours. Urine analysis:    Component Value Date/Time   COLORURINE STRAW (A) 05/16/2023 2344   APPEARANCEUR CLEAR (A) 05/16/2023 2344   LABSPEC 1.010 05/16/2023 2344   PHURINE 7.0 05/16/2023 2344   GLUCOSEU NEGATIVE 05/16/2023 2344   GLUCOSEU NEGATIVE 12/06/2016 0925   HGBUR SMALL (A) 05/16/2023 2344   BILIRUBINUR NEGATIVE 05/16/2023 2344   BILIRUBINUR neg 11/23/2016 0907   KETONESUR NEGATIVE 05/16/2023 2344   PROTEINUR NEGATIVE 05/16/2023 2344   UROBILINOGEN 0.2 12/06/2016 0925   NITRITE NEGATIVE 05/16/2023 2344   LEUKOCYTESUR NEGATIVE 05/16/2023 2344    Radiological Exams on Admission: MR BRAIN WO CONTRAST Result Date: 05/17/2023 CLINICAL DATA:  Neuro deficit, acute, stroke suspected EXAM: MRI HEAD WITHOUT CONTRAST TECHNIQUE: Multiplanar, multiecho pulse sequences of the brain and surrounding structures were obtained without intravenous contrast. COMPARISON:  CTA head/neck from earlier today. FINDINGS: Brain: No acute infarction, hemorrhage, hydrocephalus, extra-axial collection or mass lesion. Small remote left basal ganglia infarct. Vascular: Normal flow voids. Skull and upper cervical spine: Normal marrow signal. Sinuses/Orbits: Mostly clear sinuses.  No acute orbital findings. Other: No mastoid effusions. IMPRESSION: No evidence of acute intracranial abnormality. Electronically Signed   By: Feliberto Harts M.D.   On: 05/17/2023 02:53   CT Angio Head Neck W WO CM Result Date: 05/17/2023 CLINICAL DATA:  Neuro deficit, acute, stroke suspected Vertigo, central EXAM: CT ANGIOGRAPHY HEAD AND NECK WITH AND WITHOUT CONTRAST TECHNIQUE: Multidetector CT imaging of the head  and neck was performed using the standard protocol during bolus administration of intravenous contrast. Multiplanar CT image reconstructions and MIPs were obtained to evaluate the vascular anatomy.  Carotid stenosis measurements (when applicable) are obtained utilizing NASCET criteria, using the distal internal carotid diameter as the denominator. RADIATION DOSE REDUCTION: This exam was performed according to the departmental dose-optimization program which includes automated exposure control, adjustment of the mA and/or kV according to patient size and/or use of iterative reconstruction technique. CONTRAST:  75mL OMNIPAQUE IOHEXOL 350 MG/ML SOLN COMPARISON:  CT head April 07, 2020. FINDINGS: CT HEAD FINDINGS Brain: Small age-indeterminate lacunar infarct in left basal ganglia. No evidence of acute large vascular territory infarction, hemorrhage, hydrocephalus, extra-axial collection or mass lesion/mass effect. Vascular: See below. Skull: No acute fracture. Sinuses/Orbits: Mostly clear sinuses.  No acute orbital findings. Review of the MIP images confirms the above findings CTA NECK FINDINGS Aortic arch: Great vessel origins are patent. Right carotid system: No evidence of dissection, stenosis (50% or greater), or occlusion. Left carotid system: No evidence of dissection, stenosis (50% or greater), or occlusion. Vertebral arteries: Right dominant. No evidence of dissection, stenosis (50% or greater), or occlusion. Skeleton: No acute abnormality on limited assessment. Other neck: No acute abnormality on limited assessment. Upper chest: Visualized lung apices are clear. Review of the MIP images confirms the above findings CTA HEAD FINDINGS Anterior circulation: Bilateral intracranial ICAs, MCAs, and ACAs are patent without proximal hemodynamically significant stenosis. Posterior circulation: Bilateral intradural vertebral arteries, basilar artery and bilateral posterior cerebral arteries are patent without proximal hemodynamically significant stenosis. Fenestration of the basilar artery, anatomic variant. Venous sinuses: As permitted by contrast timing, patent. Review of the MIP images confirms the above  findings IMPRESSION: 1. No emergent large vessel occlusion or proximal hemodynamically significant stenosis. 2. Small age-indeterminate lacunar infarct in the left basal ganglia. MRI could provide more sensitive evaluation for acute infarct if clinically warranted. Electronically Signed   By: Feliberto Harts M.D.   On: 05/17/2023 01:37   DG Chest Port 1 View Result Date: 05/17/2023 CLINICAL DATA:  gen weakness, infx wu EXAM: PORTABLE CHEST 1 VIEW COMPARISON:  Chest x-ray 12/12/2022, CT chest 05/31/2014 FINDINGS: The heart and mediastinal contours are within normal limits. No focal consolidation. No pulmonary edema. No pleural effusion. No pneumothorax. No acute osseous abnormality. IMPRESSION: No active disease. Electronically Signed   By: Tish Frederickson M.D.   On: 05/17/2023 00:06     Data Reviewed: Relevant notes from primary care and specialist visits, past discharge summaries as available in EHR, including Care Everywhere. Prior diagnostic testing as pertinent to current admission diagnoses Updated medications and problem lists for reconciliation ED course, including vitals, labs, imaging, treatment and response to treatment Triage notes, nursing and pharmacy notes and ED provider's notes Notable results as noted in HPI   Assessment and Plan: * Dizziness Chronic tremors MRI negative for acute intracranial abnormality, CTA head and neck showing age-indeterminate lacunar infarct Continuous cardiac monitoring,  Had echo 01/2023 showed EF 55 to 60%, G1 DD, no aortic stenosis Meclizine as needed PT OT consult Fall precautions Neurology consult  Asthma, chronic, severe persistent, uncomplicated Not acutely exacerbated Continue home inhalers with DuoNebs as needed  OA (osteoarthritis) Continue Tylenol as needed  Osteoporosis Follows with endocrinology.  On Reclast infusions  Hypothyroidism Continue levothyroxine  Obstructive sleep apnea on CPAP CPAP nightly     DVT  prophylaxis: Lovenox  Consults: Neurology, Dr.Bhagat    Advance Care Planning:   Code Status: Prior  Family Communication: none  Disposition Plan: Back to previous home environment  Severity of Illness: The appropriate patient status for this patient is OBSERVATION. Observation status is judged to be reasonable and necessary in order to provide the required intensity of service to ensure the patient's safety. The patient's presenting symptoms, physical exam findings, and initial radiographic and laboratory data in the context of their medical condition is felt to place them at decreased risk for further clinical deterioration. Furthermore, it is anticipated that the patient will be medically stable for discharge from the hospital within 2 midnights of admission.   Author: Andris Baumann, MD 05/17/2023 3:27 AM  For on call review [email or web address].

## 2023-05-17 NOTE — ED Notes (Signed)
 This RN message Para March, MD to verify if she noted a facial droop or not on assessment. Para March, MD did not note a facial droop at the time of her assessment. After speaking with the MD, this RN went back in to the patients room to reassess her and found that her facial asymmetry has improved, but a very slight droop is still noted. Jon Billings, NP made aware.

## 2023-05-17 NOTE — Evaluation (Addendum)
 Speech Language Pathology Evaluation Patient Details Name: Dorothy Church MRN: [id number] DOB: [birth year] Today's Date: 05/17/2023 Time: 1215-1300 SLP Time Calculation (min) (ACUTE ONLY): 45 min  Problem List:  Patient Active Problem List   Diagnosis Date Noted   Dizziness 05/17/2023   Impaired fasting glucose 06/17/2022   BPPV (benign paroxysmal positional vertigo) 03/13/2022   Allergic rhinitis 03/10/2021   Gastroesophageal reflux disease 09/28/2020   Gastric polyp 04/28/2020   Lower esophageal ring (Schatzki) 04/28/2020   Insomnia 12/30/2018   Hiatal hernia    Mood disorder (HCC) 03/11/2017   Asthma, chronic, severe persistent, uncomplicated 05/11/2016   OA (osteoarthritis) 04/04/2015   Osteoporosis 10/07/2012   Vitamin D deficiency    Hypothyroidism    Hyperlipidemia 08/07/2012   Obstructive sleep apnea on CPAP 08/07/2012   Past Medical History:  Past Medical History:  Diagnosis Date   Abdominal pain    Abnormal CT of liver    Accident due to mechanical fall without injury 01/28/2022   Acute pain of left shoulder 09/28/2020   Anxiety    Anxiety 04/28/2020   Aortic stenosis    Aortic stenosis 03/04/2012   Overview:   Not confirmed by Dr Mariah Milling.    Aortic valve disorders    Arthritis    Asthma    Asthma 08/07/2012   Barrett's esophagus    [facility] last EGD 02/2018 Dr. Marva Panda    Barrett's esophagus 03/11/2017   Overview:  Dr Marva Panda   Carotid artery stenosis 12/30/2018   1-39% b/l carotids       Colon polyps    Colon polyps    COPD (chronic obstructive pulmonary disease) (HCC)    DDD (degenerative disc disease), cervical    DDD (degenerative disc disease), cervical 04/28/2020   DDD (degenerative disc disease), lumbar 01/22/2017   Depressive disorder    Diverticulosis    Erosive esophagitis    Erosive esophagitis 04/28/2020   Fundic gland polyps of stomach, benign    Gastric polyp    Gastric polyps    Gastritis    Generalized osteoarthritis of hand  05/03/2015   GERD (gastroesophageal reflux disease)    GOA (generalized osteoarthritis) 11/23/2015   Goiter    Hiatal hernia    small    Hiatal hernia    History of colonic polyps 12/08/2013   Hypercholesteremia    Hypothyroidism    Impaired fasting glucose    Impaired fasting glucose 03/04/2012   Overview:   Glucose 111 02/2012   Intrinsic asthma, unspecified    Joint pain of ankle and foot, right 05/17/2015   Left rib fracture    Mechanical complication of knee prosthesis, initial encounter (HCC) 08/25/2017   Obstructive sleep apnea (adult) (pediatric)    used cpap x 10 years per Dr. Belia Heman off as of 12/30/18    OSA (obstructive sleep apnea)    used cpap x 10 years per Dr. 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   Stress 04/28/2020   Syncope and collapse    TIA (transient ischemic attack)    TIA (transient ischemic attack) 12/11/2016   TIA (transient ischemic attack) 12/11/2016   UTI (urinary tract infection)  Vitamin D deficiency    Past Surgical History:  Past Surgical History:  Procedure Laterality Date   BREAST EXCISIONAL BIOPSY Left    BREAST SURGERY     CATARACT EXTRACTION W/PHACO Right 11/07/2021   Procedure: CATARACT EXTRACTION PHACO AND INTRAOCULAR LENS PLACEMENT (IOC) RIGHT  8.87 00:50.8;  Surgeon: Galen Manila, MD;  Location: [facility];  Service: Ophthalmology;  Laterality: Right;   COLONOSCOPY     COLONOSCOPY WITH ESOPHAGOGASTRODUODENOSCOPY (EGD)     COLONOSCOPY WITH PROPOFOL N/A 12/14/2014   Procedure: COLONOSCOPY WITH PROPOFOL;  Surgeon: Christena Deem, MD;  Location: [facility];  Service: Endoscopy;  Laterality: N/A;   COLONOSCOPY WITH PROPOFOL N/A  03/18/2018   Procedure: COLONOSCOPY WITH PROPOFOL;  Surgeon: Christena Deem, MD;  Location: [facility];  Service: Endoscopy;  Laterality: N/A;   ENUCLEATION     ESOPHAGOGASTRODUODENOSCOPY N/A 08/30/2020   Procedure: ESOPHAGOGASTRODUODENOSCOPY (EGD);  Surgeon: Regis Bill, MD;  Location: [facility] END[facility]COPY;  Service: Endoscopy;  Laterality: N/A;   ESOPHAGOGASTRODUODENOSCOPY (EGD) WITH PROPOFOL N/A 12/14/2014   Procedure: ESOPHAGOGASTRODUODENOSCOPY (EGD) WITH PROPOFOL;  Surgeon: Christena Deem, MD;  Location: [facility] END[facility]COPY;  Service: Endoscopy;  Laterality: N/A;   ESOPHAGOGASTRODUODENOSCOPY (EGD) WITH PROPOFOL N/A 10/18/2016   Procedure: ESOPHAGOGASTRODUODENOSCOPY (EGD) WITH PROPOFOL;  Surgeon: Christena Deem, MD;  Location: [facility] ENDOSCOPY;  Service: Endoscopy;  Laterality: N/A;   ESOPHAGOGASTRODUODENOSCOPY (EGD) WITH PROPOFOL N/A 03/18/2018   Procedure: ESOPHAGOGASTRODUODENOSCOPY (EGD) WITH PROPOFOL;  Surgeon: Christena Deem, MD;  Location: [facility] END[facility]COPY;  Service: Endoscopy;  Laterality: N/A;   ESOPHAGOGASTRODUODENOSCOPY (EGD) WITH PROPOFOL N/A 12/19/2020   Procedure: ESOPHAGOGASTRODUODENOSCOPY (EGD) WITH PROPOFOL;  Surgeon: Regis Bill, MD;  Location: [facility] ENDOSCOPY;  Service: Endoscopy;  Laterality: N/A;   EYE SURGERY     FRACTURE SURGERY     HIP SURGERY Left    pin and plate   IR ANGIO INTRA EXTRACRAN SEL COM CAROTID INNOMINATE BILAT MOD SED  10/17/2018   IR ANGIO VERTEBRAL SEL VERTEBRAL BILAT MOD SED  10/17/2018   IR ANGIOGRAM EXTREMITY LEFT  10/17/2018   JOINT REPLACEMENT     Left Total Knee Arthroplasty     Dr Ernest Pine and also had 39 years ago from 2020    nasal polyps     removed x 2 affects sense of smell x 10-15 years from 04/28/20   prosthesis eye implant     REPLACEMENT UNICONDYLAR JOINT KNEE Left    TONSILLECTOMY     TOTAL KNEE REVISION Left 09/13/2017   Procedure: TOTAL KNEE REVISION;  Surgeon: Donato Heinz, MD;  Location: [facility] ORS;   Service: Orthopedics;  Laterality: Left;   HPI:  89yo female admitted 05/16/23 with generalized weakness, new onset tremors, decreased appetite, dizziness, gait instability, nausea x2-3 days. PMH: anxiety/depression, GERD, osteoporosis, OA, HLD, asthma, OSA/CPAP, hypothyroidism, Barrett's esophagus.   Assessment / Plan / Recommendation Clinical Impression  Pt presents with mild neurocognitive deficits per [facility] Mental Status (SLUMS) Examination. Pt scored 22/30, with points lost on thought organization (named 9 animals in 60 seconds, n=15+), delayed recall (4/5 recalled), digit reversal, clock drawing (raising concern for executive function deficit), and auditory attention and recall. Receptive and Expressive Language are intact. Speech is fully intelligible.   Pt reports she has a daughter in [place], and a son in [place], and lives at [facility] in [place]. Her twin sister died in 12-24-22, and had been diagnosed with dementia. During discussion of pt test results, pt was noted to begin  having tremors. She verbalized awareness of the return of LUE tremors, and wondered if the tremors had anything to do with anxiety. She reports she takes Paxil, but has not noticed any benefit.  Pt may be at/near baseline level of function given negative MRI, however, she was encouraged to notify PCP if difficulties arise upon return to normal routines. Acute ST signing off at this time. Please reconsult if needs arise. RN/MD informed.    SLP Assessment  SLP Recommendation/Assessment: All further Speech Language Pathology needs can be addressed in the next venue of care (if needs arise.)  SLP Visit Diagnosis: Cognitive communication deficit (R41.841)    Recommendations for follow up therapy are one component of a multi-disciplinary discharge planning process, led by the attending physician.  Recommendations may be updated based on patient status, additional functional criteria and  insurance authorization.    Follow Up Recommendations  Follow physician's recommendations for discharge plan and follow up therapies    Assistance Recommended at Discharge  PRN  Functional Status Assessment Patient has had a recent decline in their functional status and demonstrates the ability to make significant improvements in function in a reasonable and predictable amount of time.     SLP Evaluation Cognition  Overall Cognitive Status: No family/caregiver present to determine baseline cognitive functioning (Pt reports she was fully independent prior to admit.) Arousal/Alertness: Awake/alert Orientation Level: Oriented X4       Comprehension  Auditory Comprehension Overall Auditory Comprehension: Appears within functional limits for tasks assessed    Expression Expression Primary Mode of Expression: Verbal Verbal Expression Overall Verbal Expression: Appears within functional limits for tasks assessed   Oral / Motor  Oral Motor/Sensory Function Overall Oral Motor/Sensory Function: Within functional limits Motor Speech Overall Motor Speech: Appears within functional limits for tasks assessed Articulation: Within functional limitis           Kampbell Holaway B. Murvin Natal, [facility], CCC-SLP Speech Language Pathologist  Leigh Aurora 05/17/2023, 1:21 PM

## 2023-05-17 NOTE — Plan of Care (Signed)
 Patient was seen and examined at bedside.  Patient was admitted last night due to imbalance and dizziness.  Patient is having action tremors and possible anxiety.  CTA positive for stroke but MRI negative.  Discussed with neurology, recommended no stroke workup.  Follow-up with ENT as an outpatient was recommended. Patient was seen by PT and OT, who recommended she is not safe to discharge home today as she lives alone.  May need home health set up.  Follow TOC Check orthostatics and plan to [facility] tomorrow a.m.   Continue meclizine as needed for dizziness and symptomatic treatment

## 2023-05-17 NOTE — TOC Transition Note (Signed)
 Transition of Care [facility]) - Discharge Note   Patient Details  Name: Dorothy Church MRN: [id number] Date of Birth: 1935/07/21  Transition of Care [facility]) CM/SW Contact:  Elberta Fortis, RN Phone Number: 05/17/2023, 3:10 PM   Clinical Narrative:  Valentina Shaggy at [facility] and she states they can take Makenley at their SNF and to go ahead and do the SNF workup. TOC to continue to follow.        Barriers to Discharge: Continued Medical Work up   Patient Goals and CMS Choice            Discharge Placement                       Discharge Plan and Services Additional resources added to the After Visit Summary for     Discharge Planning Services: CM Consult Post Acute Care Choice: Skilled Nursing Facility (Wants to go to [facility] or respite)                               Social Drivers of Health (SDOH) Interventions SDOH Screenings   Food Insecurity: No Food Insecurity (03/21/2023)  Housing: Unknown (03/21/2023)  Transportation Needs: No Transportation Needs (03/21/2023)  Utilities: Not At Risk (06/14/2022)  Alcohol Screen: Low Risk  (03/21/2023)  Depression (PHQ2-9): Medium Risk (10/08/2022)  Financial Resource Strain: Low Risk  (03/21/2023)  Physical Activity: Inactive (03/21/2023)  Social Connections: Moderately Integrated (03/21/2023)  Stress: Stress Concern Present (03/21/2023)  Tobacco Use: Low Risk  (05/16/2023)     Readmission Risk Interventions     No data to display

## 2023-05-17 NOTE — Assessment & Plan Note (Signed)
 Continue Tylenol as needed

## 2023-05-17 NOTE — Progress Notes (Signed)
 OT Cancellation Note  Patient Details Name: Dorothy Church MRN: [id number] DOB: 27-Feb-1935   Cancelled Treatment:    Reason Eval/Treat Not Completed: Other (comment). Consult received, chart reviewed. Spoke with nursing and requesting hold this morning 2/2 fatigue and pt shaking when taking recent vitals. Will re-attempt at later date/time as appropriate.   Arman Filter., MPH, MS, OTR/L ascom [phone] 05/17/23, 12:37 PM

## 2023-05-17 NOTE — Assessment & Plan Note (Addendum)
 Chronic tremors MRI negative for acute intracranial abnormality, CTA head and neck showing age-indeterminate lacunar infarct Continuous cardiac monitoring,  Had echo 01/2023 showed EF 55 to 60%, G1 DD, no aortic stenosis Meclizine as needed PT OT consult Fall precautions Neurology consult

## 2023-05-17 NOTE — Assessment & Plan Note (Signed)
Not acutely exacerbated ?Continue home inhalers with DuoNebs as needed ?

## 2023-05-17 NOTE — TOC Transition Note (Signed)
 Transition of Care [facility]) - Discharge Note   Patient Details  Name: Dorothy Church MRN: [id number] Date of Birth: 1935-10-07  Transition of Care [facility]) CM/SW Contact:  Elberta Fortis, RN Phone Number: 05/17/2023, 4:20 PM   Clinical Narrative:   Insurance auth entered and pending for [employer].       Barriers to Discharge: Continued Medical Work up   Patient Goals and CMS Choice            Discharge Placement                       Discharge Plan and Services Additional resources added to the After Visit Summary for     Discharge Planning Services: CM Consult Post Acute Care Choice: Skilled Nursing Facility (Wants to go to [facility] or respite)                               Social Drivers of Health (SDOH) Interventions SDOH Screenings   Food Insecurity: No Food Insecurity (03/21/2023)  Housing: Unknown (03/21/2023)  Transportation Needs: No Transportation Needs (03/21/2023)  Utilities: Not At Risk (06/14/2022)  Alcohol Screen: Low Risk  (03/21/2023)  Depression (PHQ2-9): Medium Risk (10/08/2022)  Financial Resource Strain: Low Risk  (03/21/2023)  Physical Activity: Inactive (03/21/2023)  Social Connections: Moderately Integrated (03/21/2023)  Stress: Stress Concern Present (03/21/2023)  Tobacco Use: Low Risk  (05/16/2023)     Readmission Risk Interventions     No data to display

## 2023-05-17 NOTE — Evaluation (Signed)
 Physical Therapy Evaluation Patient Details Name: Dorothy Church MRN: [id number] DOB: Dec 16, 1935 Today's Date: 05/17/2023  History of Present Illness  87yoF who comes to [facility] after 3 days of malaise, weakness, dizziness that is worse with being upright active. Pt says she doesnt feel right, endorses difficulty concentrating. Workup negative for acute stroke, neurology has signed off. Pt had had BPPV in recent past, been treated for vertigo here in 2024, she reports her current symptoms are not vertigo symptoms. Pt lives alone at [facility], AMB without device, but typically is limited by DOE.  Clinical Impression  Pt in bed on entry agreeable to assessment. Pt able to perform bed mobility and transfers without gross increase in symptoms, but lacks confidence on her feet in standing and during walking. Orthostatic vitals are normal during my assessment, but were (+) earlier in day. Pt able to AMB ~4ft without device, although she requires hand held support due to unsteadiness, gait speed far slower than her baseline. Pt could be able to safely navigate short distances to bathroom, kitchen with device, but is not steady enough and strong enough to manage all of her typical self care, meal prep, household management. Ideally the [facility] community could provide some additional support for in-home recovery, but she does not need a degree of assistance to warrant SNF stay at [place]. Will follow.       If plan is discharge home, recommend the following:     Can travel by private vehicle        Equipment Recommendations None recommended by PT  Recommendations for Other Services       Functional Status Assessment Patient has had a recent decline in their functional status and demonstrates the ability to make significant improvements in function in a reasonable and predictable amount of time.     Precautions / Restrictions Precautions Precautions: Fall Recall of Precautions/Restrictions: Intact       Mobility  Bed Mobility Overal bed mobility: Modified Independent                  Transfers Overall transfer level: Needs assistance Equipment used: None Transfers: Sit to/from Stand Sit to Stand: Supervision                Ambulation/Gait Ambulation/Gait assistance: Contact guard assist Gait Distance (Feet): 70 Feet Assistive device: None, 1 person hand held assist       Pre-gait activities: standing BP x0 miuntes and 1 miuntes: SBP 120s and 140s respectively. General Gait Details: slow, consistently unsteady without frank LOB, moving slowly, requires 1 hand assist to turn around.  Stairs            Wheelchair Mobility     Tilt Bed    Modified Rankin (Stroke Patients Only)       Balance Overall balance assessment: Needs assistance                                           Pertinent Vitals/Pain Pain Assessment Pain Assessment: No/denies pain    Home Living Family/patient expects to be discharged to:: Private residence Living Arrangements: Alone Available Help at Discharge: Friend(s) Type of Home: Independent living facility [facility])       Alternate Level Stairs-Number of Steps: finished space, main level Home Layout: Two level Home Equipment: Agricultural consultant (2 wheels) Additional Comments: [place]  Prior Function               Mobility Comments: AMB to mailbox outside but is limited by chronic DOE, can tolerate limited community distances without device; no baseline balance issues. ADLs Comments: independent     Extremity/Trunk Assessment                Communication        Cognition Arousal: Alert Behavior During Therapy: WFL for tasks assessed/performed   PT - Cognitive impairments: No apparent impairments                                 Cueing       General Comments      Exercises     Assessment/Plan    PT Assessment Patient needs continued PT  services  PT Problem List Decreased strength;Decreased range of motion;Decreased activity tolerance;Decreased balance;Decreased mobility;Decreased knowledge of use of DME;Decreased safety awareness;Decreased knowledge of precautions       PT Treatment Interventions DME instruction;Gait training;Stair training;Functional mobility training;Therapeutic activities;Therapeutic exercise;Balance training;Patient/family education    PT Goals (Current goals can be found in the Care Plan section)  Acute Rehab PT Goals Patient Stated Goal: wants to not feel tired all the time PT Goal Formulation: With patient Time For Goal Achievement: 05/31/23 Potential to Achieve Goals: Fair    Frequency Min 1X/week     Co-evaluation               AM-PAC PT "6 Clicks" Mobility  Outcome Measure Help needed turning from your back to your side while in a flat bed without using bedrails?: A Little Help needed moving from lying on your back to sitting on the side of a flat bed without using bedrails?: A Little Help needed moving to and from a bed to a chair (including a wheelchair)?: A Little Help needed standing up from a chair using your arms (e.g., wheelchair or bedside chair)?: A Little Help needed to walk in hospital room?: A Little Help needed climbing 3-5 steps with a railing? : A Little 6 Click Score: 18    End of Session Equipment Utilized During Treatment: Gait belt Activity Tolerance: Patient tolerated treatment well;No increased pain Patient left: in bed;with family/visitor present;with call bell/phone within reach Nurse Communication: Mobility status PT Visit Diagnosis: Unsteadiness on feet (R26.81);Difficulty in walking, not elsewhere classified (R26.2);Muscle weakness (generalized) (M62.81)    Time: 1610-9604 PT Time Calculation (min) (ACUTE ONLY): 20 min   Charges:   PT Evaluation $PT Eval Moderate Complexity: 1 Mod PT Treatments $Therapeutic Activity: 8-22 mins PT General  Charges $$ ACUTE PT VISIT: 1 Visit       2:09 PM, 05/17/23 Rosamaria Lints, PT, DPT Physical Therapist - [facility]  [phone] (ASCOM)    Athira Janowicz C 05/17/2023, 2:03 PM

## 2023-05-17 NOTE — Care Management Obs Status (Signed)
 MEDICARE OBSERVATION STATUS NOTIFICATION   Patient Details  Name: AOKI WEDEMEYER MRN: [id number] Date of Birth: November 06, 1935   Medicare Observation Status Notification Given:  Yes    Kizzie Ide Malin Sambrano, RN 05/17/2023, 3:06 PM

## 2023-05-17 NOTE — Plan of Care (Signed)
 Neurology plan of care  Given MRI is negative for acute findings or anything in the cerebellum this is c/w peripheral vertigo. (Remote basal ganglia lacunar infarct would not cause vertigo). Recommend PT and ENT consult if needed. Continue meclizine. If new questions arise regarding a neurologic concern feel free to reconsult.  Bing Neighbors, MD [facility] [phone]  If 7pm- 7am, please page neurology on call as listed in AMION.

## 2023-05-18 DIAGNOSIS — M810 Age-related osteoporosis without current pathological fracture: Secondary | ICD-10-CM

## 2023-05-18 DIAGNOSIS — G4733 Obstructive sleep apnea (adult) (pediatric): Secondary | ICD-10-CM | POA: Diagnosis not present

## 2023-05-18 DIAGNOSIS — I951 Orthostatic hypotension: Secondary | ICD-10-CM | POA: Diagnosis not present

## 2023-05-18 DIAGNOSIS — E039 Hypothyroidism, unspecified: Secondary | ICD-10-CM | POA: Diagnosis not present

## 2023-05-18 DIAGNOSIS — Z7982 Long term (current) use of aspirin: Secondary | ICD-10-CM | POA: Diagnosis not present

## 2023-05-18 DIAGNOSIS — Z8673 Personal history of transient ischemic attack (TIA), and cerebral infarction without residual deficits: Secondary | ICD-10-CM | POA: Diagnosis not present

## 2023-05-18 DIAGNOSIS — M1711 Unilateral primary osteoarthritis, right knee: Secondary | ICD-10-CM

## 2023-05-18 DIAGNOSIS — H8110 Benign paroxysmal vertigo, unspecified ear: Secondary | ICD-10-CM | POA: Diagnosis not present

## 2023-05-18 DIAGNOSIS — E7800 Pure hypercholesterolemia, unspecified: Secondary | ICD-10-CM | POA: Diagnosis present

## 2023-05-18 DIAGNOSIS — Z79890 Hormone replacement therapy (postmenopausal): Secondary | ICD-10-CM | POA: Diagnosis not present

## 2023-05-18 DIAGNOSIS — Z880 Allergy status to penicillin: Secondary | ICD-10-CM | POA: Diagnosis not present

## 2023-05-18 DIAGNOSIS — Z1152 Encounter for screening for COVID-19: Secondary | ICD-10-CM | POA: Diagnosis not present

## 2023-05-18 DIAGNOSIS — F419 Anxiety disorder, unspecified: Secondary | ICD-10-CM | POA: Diagnosis present

## 2023-05-18 DIAGNOSIS — M6281 Muscle weakness (generalized): Secondary | ICD-10-CM | POA: Diagnosis not present

## 2023-05-18 DIAGNOSIS — Z888 Allergy status to other drugs, medicaments and biological substances status: Secondary | ICD-10-CM | POA: Diagnosis not present

## 2023-05-18 DIAGNOSIS — R270 Ataxia, unspecified: Secondary | ICD-10-CM | POA: Diagnosis present

## 2023-05-18 DIAGNOSIS — R2689 Other abnormalities of gait and mobility: Secondary | ICD-10-CM | POA: Diagnosis not present

## 2023-05-18 DIAGNOSIS — M1990 Unspecified osteoarthritis, unspecified site: Secondary | ICD-10-CM | POA: Diagnosis not present

## 2023-05-18 DIAGNOSIS — G4700 Insomnia, unspecified: Secondary | ICD-10-CM | POA: Diagnosis not present

## 2023-05-18 DIAGNOSIS — R251 Tremor, unspecified: Secondary | ICD-10-CM | POA: Diagnosis not present

## 2023-05-18 DIAGNOSIS — Z7983 Long term (current) use of bisphosphonates: Secondary | ICD-10-CM | POA: Diagnosis not present

## 2023-05-18 DIAGNOSIS — Z8249 Family history of ischemic heart disease and other diseases of the circulatory system: Secondary | ICD-10-CM | POA: Diagnosis not present

## 2023-05-18 DIAGNOSIS — R278 Other lack of coordination: Secondary | ICD-10-CM | POA: Diagnosis not present

## 2023-05-18 DIAGNOSIS — Z961 Presence of intraocular lens: Secondary | ICD-10-CM | POA: Diagnosis present

## 2023-05-18 DIAGNOSIS — E785 Hyperlipidemia, unspecified: Secondary | ICD-10-CM | POA: Diagnosis not present

## 2023-05-18 DIAGNOSIS — K219 Gastro-esophageal reflux disease without esophagitis: Secondary | ICD-10-CM | POA: Diagnosis not present

## 2023-05-18 DIAGNOSIS — J4550 Severe persistent asthma, uncomplicated: Secondary | ICD-10-CM | POA: Diagnosis not present

## 2023-05-18 DIAGNOSIS — Z9841 Cataract extraction status, right eye: Secondary | ICD-10-CM | POA: Diagnosis not present

## 2023-05-18 DIAGNOSIS — Z96652 Presence of left artificial knee joint: Secondary | ICD-10-CM | POA: Diagnosis present

## 2023-05-18 DIAGNOSIS — G252 Other specified forms of tremor: Secondary | ICD-10-CM | POA: Diagnosis present

## 2023-05-18 DIAGNOSIS — J4489 Other specified chronic obstructive pulmonary disease: Secondary | ICD-10-CM | POA: Diagnosis present

## 2023-05-18 DIAGNOSIS — Z79899 Other long term (current) drug therapy: Secondary | ICD-10-CM | POA: Diagnosis not present

## 2023-05-18 HISTORY — DX: Orthostatic hypotension: I95.1

## 2023-05-18 MED ORDER — SODIUM CHLORIDE 0.9 % IV BOLUS
1000.0000 mL | Freq: Once | INTRAVENOUS | Status: AC
Start: 2023-05-18 — End: 2023-05-18
  Administered 2023-05-18: 1000 mL via INTRAVENOUS

## 2023-05-18 NOTE — ED Notes (Signed)
 Chaplin at bedside to talk with pt per request.

## 2023-05-18 NOTE — Evaluation (Signed)
 Occupational Therapy Evaluation Patient Details Name: Dorothy Church MRN: [id number] DOB: 19-May-1935 Today's Date: 05/18/2023   History of Present Illness   88 y.o. female who was brought in by EMS with generalized weakness, dizziness, tremors and imbalance. CTA positive for stroke but MRI negative. PMH of asthma, OSA on CPAP, hypothyroidism, generalized osteoarthritis, osteoporosis     Clinical Impressions Pt was seen for OT evaluation this date. Prior to hospital admission, pt resided at [facility] where she was active and IND, driving to the grocery store, bank, etc and using no AD.  Pt presents to acute OT demonstrating impaired ADL performance and functional mobility 2/2 weakness, dizziness, and mild balance deficits. Pt currently requires MOD I for bed mobility, CGA for STS and CGA for mobility to the bathroom and back using RW. Min/CGA for toilet transfer using grab bar, SUP for seated hygiene. Pt performed standing grooming tasks at sink with unilateral support and CGA for safety. Orthostatic vitals taken Orthostatic VS during session:  BP- Lying BP- Sitting BP- Standing at 0 minutes BP- Standing at 3 minutes  05/18/23 1016 150/86 (!) 137/92 112/79 121/79     BP returned to 136/91 once seated EOB. She is a high fall risk d/t dizziness and weakness at this time. Would need supervision to safely discharge home. Pt would benefit from skilled OT services to address noted impairments and functional limitations (see below for any additional details) in order to maximize safety and independence while minimizing falls risk and caregiver burden. Do anticipate the need for follow up OT services upon acute hospital DC      If plan is discharge home, recommend the following:   A little help with walking and/or transfers;A little help with bathing/dressing/bathroom;Assistance with cooking/housework;Assist for transportation     Functional Status Assessment   Patient has had a recent  decline in their functional status and demonstrates the ability to make significant improvements in function in a reasonable and predictable amount of time.     Equipment Recommendations   None recommended by OT     Recommendations for Other Services         Precautions/Restrictions   Precautions Precautions: Fall Recall of Precautions/Restrictions: Intact Restrictions Weight Bearing Restrictions Per Provider Order: No     Mobility Bed Mobility Overal bed mobility: Modified Independent                  Transfers Overall transfer level: Needs assistance Equipment used: Rolling walker (2 wheels) Transfers: Sit to/from Stand Sit to Stand: Contact guard assist           General transfer comment: CGA for STS and for mobility to the bathroom and back using RW; no LOB pt endorses weakness      Balance Overall balance assessment: Needs assistance Sitting-balance support: Feet supported Sitting balance-Leahy Scale: Good     Standing balance support: Reliant on assistive device for balance, Bilateral upper extremity supported Standing balance-Leahy Scale: Fair                             ADL either performed or assessed with clinical judgement   ADL Overall ADL's : Needs assistance/impaired     Grooming: Wash/dry face;Wash/dry hands;Oral care;Standing;Contact guard assist                   Toilet Transfer: Contact guard assist;Grab bars;Rolling walker (2 wheels);Minimal assistance  Vision         Perception         Praxis         Pertinent Vitals/Pain Pain Assessment Pain Assessment: No/denies pain     Extremity/Trunk Assessment Upper Extremity Assessment Upper Extremity Assessment: Overall WFL for tasks assessed   Lower Extremity Assessment Lower Extremity Assessment: Generalized weakness       Communication Communication Communication: No apparent difficulties   Cognition Arousal:  Alert Behavior During Therapy: WFL for tasks assessed/performed Cognition: No apparent impairments                               Following commands: Intact       Cueing  General Comments      orthostatic positive with mild dizziness/lightheadedness; improved with longer standing   Exercises Other Exercises Other Exercises: Edu on role of OT in acute setting.   Shoulder Instructions      Home Living Family/patient expects to be discharged to:: Private residence Living Arrangements: Alone Available Help at Discharge: Friend(s) Type of Home: Independent living facility [facility])       Home Layout: Two level Alternate Level Stairs-Number of Steps: finished space, main level             Home Equipment: Agricultural consultant (2 wheels)   Additional Comments: [place]      Prior Functioning/Environment               Mobility Comments: AMB to mailbox outside but is limited by chronic DOE, can tolerate limited community distances without device; no baseline balance issues. ADLs Comments: independent, driving to bank and grocery store    OT Problem List: Decreased strength;Impaired balance (sitting and/or standing)   OT Treatment/Interventions: Self-care/ADL training;Balance training;Therapeutic exercise;Therapeutic activities;DME and/or AE instruction;Patient/family education      OT Goals(Current goals can be found in the care plan section)   Acute Rehab OT Goals Patient Stated Goal: return home OT Goal Formulation: With patient Time For Goal Achievement: 06/01/23 Potential to Achieve Goals: Good ADL Goals Pt Will Perform Lower Body Bathing: with supervision;sit to/from stand;sitting/lateral leans Pt Will Perform Lower Body Dressing: with supervision;sit to/from stand;sitting/lateral leans Pt Will Transfer to Toilet: with supervision;regular height toilet;ambulating Pt Will Perform Toileting - Clothing Manipulation and hygiene: with  modified independence;sit to/from stand;sitting/lateral leans   OT Frequency:  Min 2X/week    Co-evaluation              AM-PAC OT "6 Clicks" Daily Activity     Outcome Measure Help from another person eating meals?: None Help from another person taking care of personal grooming?: None Help from another person toileting, which includes using toliet, bedpan, or urinal?: A Little Help from another person bathing (including washing, rinsing, drying)?: A Little Help from another person to put on and taking off regular upper body clothing?: None Help from another person to put on and taking off regular lower body clothing?: A Little 6 Click Score: 21   End of Session Equipment Utilized During Treatment: Rolling walker (2 wheels) Nurse Communication: Mobility status  Activity Tolerance: Patient tolerated treatment well Patient left: with call bell/phone within reach;in bed;with nursing/sitter in room  OT Visit Diagnosis: Other abnormalities of gait and mobility (R26.89);Muscle weakness (generalized) (M62.81)                Time: 6213-0865 OT Time Calculation (min): 32 min Charges:  OT  General Charges $OT Visit: 1 Visit OT Evaluation $OT Eval Moderate Complexity: 1 Mod OT Treatments $Self Care/Home Management : 8-22 mins Aaradhya Kysar, OTR/L 05/18/23, 10:28 AM  Constance Goltz 05/18/2023, 10:24 AM

## 2023-05-18 NOTE — Progress Notes (Signed)
   05/18/23 1545  Spiritual Encounters  Type of Visit Initial  Care provided to: Patient  Referral source Other (comment) (Spiritual Consult)  Reason for visit Routine spiritual support  OnCall Visit No   Chaplain responded to a Spiritual Consult for prayer. The patient, Launi was alert and welcomed me into her space. She engaged in a life review as she shared memories that surfaced for the first time in quite awhile. I assured her that happnes sometimes in the hospital simply because we are a captured audience with nothing else to do. That reflection can be comforting or not. Lidya reflected on her loneliness and whether she should let her children know she is in the hospital . I encouraged her to consider letting them know and what they do with the information is their choice. If they worry about her that is not wrong but knowing is important for her as well as them.  We prayed as she had requested as I departed and she was appreciative of the visit.   Valerie Roys [facility]  [phone]

## 2023-05-18 NOTE — Progress Notes (Signed)
 Progress Note   Patient: Dorothy Church KVQ:[id number] DOB: [birth year] DOA: 05/16/2023     0 DOS: the patient was seen and examined on 05/18/2023   Brief hospital course: Dorothy Church is a 88 y.o. female with medical history significant for asthma, OSA on CPAP, followed by pulmonology, hypothyroidism generalized osteoarthritis, osteoporosis on Reclast infusions who was brought in by EMS with generalized weakness, dizziness, tremors and imbalance.  She denies headache, vision change, one-sided weakness numbness or tingling.   Patient is admitted to hospitalist service for further management evaluation of dizziness.  Her MRI negative, CTA head neck showed age-indeterminate lacunar infarct.  Neurology advised no further workup.  Patient is started on meclizine therapy with plan for ENT evaluation outpatient.  Patient is seen by PT was noted to be orthostatic  Assessment and Plan: * Dizziness Chronic tremors Orthostatic positive MRI negative for acute intracranial abnormality, CTA head and neck showing age-indeterminate lacunar infarct. Had echo 01/2023 showed EF 55 to 60%, G1 DD, no aortic stenosis Continuous cardiac monitoring, neurology did not recommend any further workup. Meclizine as needed. Will give gentle IV fluids for 1 day. PT OT for follow-up Fall, aspiration precautions  Asthma, chronic, severe persistent, uncomplicated Not acutely exacerbated Continue home inhalers with DuoNebs as needed  OA (osteoarthritis) Continue Tylenol as needed  Osteoporosis Follows with endocrinology.  On Reclast infusions  Hypothyroidism Continue levothyroxine  Obstructive sleep apnea on CPAP CPAP nightly         Out of bed to chair. Incentive spirometry. Nursing supportive care. Fall, aspiration precautions. Diet:  Diet Orders (From admission, onward)     Start     Ordered   05/17/23 0830  Diet Heart Room service appropriate? Yes; Fluid consistency: Thin  Diet effective  now       Question Answer Comment  Room service appropriate? Yes   Fluid consistency: Thin      05/17/23 0829           DVT prophylaxis: enoxaparin (LOVENOX) injection 40 mg Start: 05/17/23 0800  Level of care: Telemetry Medical\   Code Status: Full Code  Subjective: Patient is seen and examined today morning.  She is lying comfortably.  Did work with OT, positive orthostatics noted.  Denies any complaints of headache or dizziness.  Physical Exam: Vitals:   05/18/23 0730 05/18/23 0830 05/18/23 0909 05/18/23 1000  BP: 138/80 103/65  (!) 142/119  Pulse: 79 82    Resp: 14 18  18   Temp:   97.7 F (36.5 C)   TempSrc:   Oral   SpO2: 95% 94%    Weight:      Height:        General - Elderly Caucasian ill female, no apparent distress HEENT - PERRLA, EOMI, atraumatic head, non tender sinuses. Lung - Clear, no rales, rhonchi, wheezes. Heart - S1, S2 heard, no murmurs, rubs, trace pedal edema. Abdomen - Soft, non tender, bowel sounds good Neuro - Alert, awake and oriented, non focal exam. Skin - Warm and dry.  Data Reviewed:      Latest Ref Rng & Units 05/16/2023   11:44 PM 12/12/2022   12:47 PM 06/11/2022    2:15 PM  CBC  WBC 4.0 - 10.5 K/uL 7.1  5.2  5.6   Hemoglobin 12.0 - 15.0 g/dL 64.3  32.9  51.8   Hematocrit 36.0 - 46.0 % 37.0  40.9  40.1   Platelets 150 - 400 K/uL 215  216  206.0  Latest Ref Rng & Units 05/16/2023   11:44 PM 06/11/2022    2:15 PM 10/17/2021    2:13 PM  BMP  Glucose 70 - 99 mg/dL 811  90  83   BUN 8 - 23 mg/dL 10  17  13    Creatinine 0.44 - 1.00 mg/dL 9.14  7.82  9.56   Sodium 135 - 145 mmol/L 139  140  141   Potassium 3.5 - 5.1 mmol/L 3.6  4.3  4.1   Chloride 98 - 111 mmol/L 106  101  104   CO2 22 - 32 mmol/L 25  32  29   Calcium 8.9 - 10.3 mg/dL 9.3  9.3  9.1    MR BRAIN WO CONTRAST Result Date: 05/17/2023 CLINICAL DATA:  Neuro deficit, acute, stroke suspected EXAM: MRI HEAD WITHOUT CONTRAST TECHNIQUE: Multiplanar, multiecho pulse  sequences of the brain and surrounding structures were obtained without intravenous contrast. COMPARISON:  CTA head/neck from earlier today. FINDINGS: Brain: No acute infarction, hemorrhage, hydrocephalus, extra-axial collection or mass lesion. Small remote left basal ganglia infarct. Vascular: Normal flow voids. Skull and upper cervical spine: Normal marrow signal. Sinuses/Orbits: Mostly clear sinuses.  No acute orbital findings. Other: No mastoid effusions. IMPRESSION: No evidence of acute intracranial abnormality. Electronically Signed   By: Feliberto Harts M.D.   On: 05/17/2023 02:53   CT Angio Head Neck W WO CM Result Date: 05/17/2023 CLINICAL DATA:  Neuro deficit, acute, stroke suspected Vertigo, central EXAM: CT ANGIOGRAPHY HEAD AND NECK WITH AND WITHOUT CONTRAST TECHNIQUE: Multidetector CT imaging of the head and neck was performed using the standard protocol during bolus administration of intravenous contrast. Multiplanar CT image reconstructions and MIPs were obtained to evaluate the vascular anatomy. Carotid stenosis measurements (when applicable) are obtained utilizing NASCET criteria, using the distal internal carotid diameter as the denominator. RADIATION DOSE REDUCTION: This exam was performed according to the departmental dose-optimization program which includes automated exposure control, adjustment of the mA and/or kV according to patient size and/or use of iterative reconstruction technique. CONTRAST:  75mL OMNIPAQUE IOHEXOL 350 MG/ML SOLN COMPARISON:  CT head April 07, 2020. FINDINGS: CT HEAD FINDINGS Brain: Small age-indeterminate lacunar infarct in left basal ganglia. No evidence of acute large vascular territory infarction, hemorrhage, hydrocephalus, extra-axial collection or mass lesion/mass effect. Vascular: See below. Skull: No acute fracture. Sinuses/Orbits: Mostly clear sinuses.  No acute orbital findings. Review of the MIP images confirms the above findings CTA NECK FINDINGS  Aortic arch: Great vessel origins are patent. Right carotid system: No evidence of dissection, stenosis (50% or greater), or occlusion. Left carotid system: No evidence of dissection, stenosis (50% or greater), or occlusion. Vertebral arteries: Right dominant. No evidence of dissection, stenosis (50% or greater), or occlusion. Skeleton: No acute abnormality on limited assessment. Other neck: No acute abnormality on limited assessment. Upper chest: Visualized lung apices are clear. Review of the MIP images confirms the above findings CTA HEAD FINDINGS Anterior circulation: Bilateral intracranial ICAs, MCAs, and ACAs are patent without proximal hemodynamically significant stenosis. Posterior circulation: Bilateral intradural vertebral arteries, basilar artery and bilateral posterior cerebral arteries are patent without proximal hemodynamically significant stenosis. Fenestration of the basilar artery, anatomic variant. Venous sinuses: As permitted by contrast timing, patent. Review of the MIP images confirms the above findings IMPRESSION: 1. No emergent large vessel occlusion or proximal hemodynamically significant stenosis. 2. Small age-indeterminate lacunar infarct in the left basal ganglia. MRI could provide more sensitive evaluation for acute infarct if clinically warranted. Electronically Signed  By: Feliberto Harts M.D.   On: 05/17/2023 01:37   DG Chest Port 1 View Result Date: 05/17/2023 CLINICAL DATA:  gen weakness, infx wu EXAM: PORTABLE CHEST 1 VIEW COMPARISON:  Chest x-ray 12/12/2022, CT chest 05/31/2014 FINDINGS: The heart and mediastinal contours are within normal limits. No focal consolidation. No pulmonary edema. No pleural effusion. No pneumothorax. No acute osseous abnormality. IMPRESSION: No active disease. Electronically Signed   By: Tish Frederickson M.D.   On: 05/17/2023 00:06    Family Communication: Discussed with patient, she understand and agree. All questions  answered.  Disposition: Status is: Observation The patient remains OBS appropriate and will d/c before 2 midnights.  Planned Discharge Destination: Skilled nursing facility     Time spent: 38 minutes  Author: Marcelino Duster, MD 05/18/2023 11:23 AM Secure chat 7am to 7pm For on call review [email or web address].

## 2023-05-18 NOTE — NC FL2 (Signed)
 [place] MEDICAID FL2 LEVEL OF CARE FORM     IDENTIFICATION  Patient Name: Dorothy Church Birthdate: 02/03/1936 Sex: female Admission Date (Current Location): 05/16/2023  [place] and [place] Number:  [address] [id number] Facility and Address:  [facility], [address], [place], [place] [address]      Provider Number: [id number]  Attending Physician Name and Address:  Marcelino Duster, MD  Relative Name and Phone Number:  CLAWSON,BARBARA (Friend)  [phone]    Current Level of Care: Hospital Recommended Level of Care: Skilled Nursing Facility Prior Approval Number:    Date Approved/Denied: 05/17/23 PASRR Number: [id number]  Discharge Plan: SNF    Current Diagnoses: Patient Active Problem List   Diagnosis Date Noted   Dizziness 05/17/2023   Impaired fasting glucose 06/17/2022   BPPV (benign paroxysmal positional vertigo) 03/13/2022   Allergic rhinitis 03/10/2021   Gastroesophageal reflux disease 09/28/2020   Gastric polyp 04/28/2020   Lower esophageal ring ([place]) 04/28/2020   Insomnia 12/30/2018   Hiatal hernia    Mood disorder (HCC) 03/11/2017   Asthma, chronic, severe persistent, uncomplicated 05/11/2016   OA (osteoarthritis) 04/04/2015   Osteoporosis 10/07/2012   Vitamin D deficiency    Hypothyroidism    Hyperlipidemia 08/07/2012   Obstructive sleep apnea on CPAP 08/07/2012    Orientation RESPIRATION BLADDER Height & Weight     Self, Time, Situation, Place  Normal Continent Weight: 67.6 kg Height:  5\' 6"  (167.6 cm)  BEHAVIORAL SYMPTOMS/MOOD NEUROLOGICAL BOWEL NUTRITION STATUS   (No behaviors, very pleasant)   Continent Diet (Regular, thin liquids)  AMBULATORY STATUS COMMUNICATION OF NEEDS Skin   Supervision Verbally Normal                       Personal Care Assistance Level of Assistance  Bathing, Dressing Bathing Assistance: Limited assistance   Dressing Assistance: Limited assistance      Functional Limitations Info  Sight Sight Info: Impaired        SPECIAL CARE FACTORS FREQUENCY  PT (By licensed PT), OT (By licensed OT)     PT Frequency: 3x/week OT Frequency: 3x/week            Contractures Contractures Info: Not present    Additional Factors Info   (None)               Current Medications (05/18/2023):  This is the current hospital active medication list Current Facility-Administered Medications  Medication Dose Route Frequency Provider Last Rate Last Admin    stroke: early stages of recovery book   Does not apply Once Andris Baumann, MD       acetaminophen (TYLENOL) tablet 1,000 mg  1,000 mg Oral Q8H PRN Andris Baumann, MD       albuterol (PROVENTIL) (2.5 MG/3ML) 0.083% nebulizer solution 2.5 mg  2.5 mg Inhalation Q6H PRN Andris Baumann, MD       alum & mag hydroxide-simeth (MAALOX/MYLANTA) 200-200-20 MG/5ML suspension 15 mL  15 mL Oral Q6H PRN Gillis Santa, MD       aspirin EC tablet 81 mg  81 mg Oral Daily Lindajo Royal V, MD   81 mg at 05/17/23 0926   enoxaparin (LOVENOX) injection 40 mg  40 mg Subcutaneous Q24H Lindajo Royal V, MD   40 mg at 05/17/23 0756   fluticasone furoate-vilanterol (BREO ELLIPTA) 100-25 MCG/ACT 1 puff  1 puff Inhalation Daily Gillis Santa, MD   1 puff at 05/17/23 3875   And   umeclidinium  bromide (INCRUSE ELLIPTA) 62.5 MCG/ACT 1 puff  1 puff Inhalation Daily Gillis Santa, MD   1 puff at 05/17/23 9147   hydrOXYzine (ATARAX) tablet 25 mg  25 mg Oral TID PRN Gillis Santa, MD   25 mg at 05/17/23 2033   levothyroxine (SYNTHROID) tablet 50 mcg  50 mcg Oral Q0600 Andris Baumann, MD   50 mcg at 05/18/23 8295   meclizine (ANTIVERT) tablet 25 mg  25 mg Oral TID PRN Andris Baumann, MD       ondansetron [facility]) tablet 4 mg  4 mg Oral Q6H PRN Andris Baumann, MD       Or   ondansetron [facility]) injection 4 mg  4 mg Intravenous Q6H PRN Andris Baumann, MD   4 mg at 05/17/23 0929   pantoprazole (PROTONIX) EC tablet 40 mg  40 mg  Oral Daily Gillis Santa, MD   40 mg at 05/17/23 1419   simvastatin (ZOCOR) tablet 20 mg  20 mg Oral q1800 Andris Baumann, MD   20 mg at 05/17/23 1830   Current Outpatient Medications  Medication Sig Dispense Refill   acetaminophen (TYLENOL) 500 MG tablet Take 1,000 mg by mouth every 8 (eight) hours as needed (PAIN).     albuterol (VENTOLIN HFA) 108 (90 Base) MCG/ACT inhaler Inhale 2 puffs into the lungs every 6 (six) hours as needed for wheezing. 17 g 5   aspirin EC 81 MG tablet Take 1 tablet (81 mg total) by mouth daily. Swallow whole.     Budeson-Glycopyrrol-Formoterol (BREZTRI AEROSPHERE) 160-9-4.8 MCG/ACT AERO Inhale 2 puffs into the lungs in the morning and at bedtime. 3 each 3   calcium carbonate (TUMS - DOSED IN MG ELEMENTAL CALCIUM) 500 MG chewable tablet Chew 1 tablet by mouth 4 (four) times daily as needed for indigestion or heartburn.     Cholecalciferol (VITAMIN D3) 50 MCG (2000 UT) capsule Take 1 capsule (2,000 Units total) by mouth daily.     latanoprost (XALATAN) 0.005 % ophthalmic solution Place 1 drop into the right eye at bedtime.     levothyroxine (SYNTHROID) 50 MCG tablet TAKE 1 TABLET BY MOUTH EVERY DAY BEFORE BREAKFAST 90 tablet 1   loratadine (CLARITIN) 10 MG tablet TAKE 1 TABLET BY MOUTH EVERY DAY AS NEEDED FOR ALLERGY (Patient taking differently: Take 10 mg by mouth daily as needed for allergies or rhinitis.) 90 tablet 3   pantoprazole (PROTONIX) 40 MG tablet TAKE 1 TABLET BY MOUTH EVERY DAY 90 tablet 3   PARoxetine (PAXIL) 40 MG tablet TAKE 1 TABLET BY MOUTH EVERY DAY IN THE MORNING 90 tablet 3   simvastatin (ZOCOR) 20 MG tablet TAKE 1 TABLET BY MOUTH EVERYDAY AT BEDTIME 90 tablet 3     Discharge Medications: Please see discharge summary for a list of discharge medications.  Relevant Imaging Results:  Relevant Lab Results:   Additional Information [id number]  Elberta Fortis, RN

## 2023-05-18 NOTE — TOC Progression Note (Signed)
 Transition of Care [facility]) - Progression Note    Patient Details  Name: EILEE SCHADER MRN: [id number] Date of Birth: [birth year]  [facility] CM/SW Contact  Elberta Fortis, RN Phone Number: 05/18/2023, 12:47 PM  Clinical Narrative:  No response has been received for referral for SNF at [facility] . Called and left a message for Sue Lush to please return the call. TOC to continue to follow.     Expected Discharge Plan: Assisted Living Barriers to Discharge: Continued Medical Work up  Expected Discharge Plan and Services   Discharge Planning Services: CM Consult Post Acute Care Choice: Skilled Nursing Facility (Wants to go to [facility] or respite) Living arrangements for the past 2 months: Independent Living Facility                                       Social Determinants of Health (SDOH) Interventions SDOH Screenings   Food Insecurity: No Food Insecurity (03/21/2023)  Housing: Unknown (03/21/2023)  Transportation Needs: No Transportation Needs (03/21/2023)  Utilities: Not At Risk (06/14/2022)  Alcohol Screen: Low Risk  (03/21/2023)  Depression (PHQ2-9): Medium Risk (10/08/2022)  Financial Resource Strain: Low Risk  (03/21/2023)  Physical Activity: Inactive (03/21/2023)  Social Connections: Moderately Integrated (03/21/2023)  Stress: Stress Concern Present (03/21/2023)  Tobacco Use: Low Risk  (05/16/2023)    Readmission Risk Interventions     No data to display

## 2023-05-19 DIAGNOSIS — E785 Hyperlipidemia, unspecified: Secondary | ICD-10-CM | POA: Diagnosis not present

## 2023-05-19 DIAGNOSIS — H8110 Benign paroxysmal vertigo, unspecified ear: Secondary | ICD-10-CM | POA: Diagnosis not present

## 2023-05-19 DIAGNOSIS — E039 Hypothyroidism, unspecified: Secondary | ICD-10-CM | POA: Diagnosis not present

## 2023-05-19 DIAGNOSIS — R2689 Other abnormalities of gait and mobility: Secondary | ICD-10-CM | POA: Diagnosis not present

## 2023-05-19 DIAGNOSIS — F331 Major depressive disorder, recurrent, moderate: Secondary | ICD-10-CM | POA: Diagnosis not present

## 2023-05-19 DIAGNOSIS — M810 Age-related osteoporosis without current pathological fracture: Secondary | ICD-10-CM | POA: Diagnosis not present

## 2023-05-19 DIAGNOSIS — J4540 Moderate persistent asthma, uncomplicated: Secondary | ICD-10-CM | POA: Diagnosis not present

## 2023-05-19 DIAGNOSIS — K219 Gastro-esophageal reflux disease without esophagitis: Secondary | ICD-10-CM | POA: Diagnosis not present

## 2023-05-19 DIAGNOSIS — R251 Tremor, unspecified: Secondary | ICD-10-CM | POA: Diagnosis not present

## 2023-05-19 DIAGNOSIS — I951 Orthostatic hypotension: Secondary | ICD-10-CM | POA: Diagnosis not present

## 2023-05-19 DIAGNOSIS — R278 Other lack of coordination: Secondary | ICD-10-CM | POA: Diagnosis not present

## 2023-05-19 DIAGNOSIS — M1711 Unilateral primary osteoarthritis, right knee: Secondary | ICD-10-CM | POA: Diagnosis not present

## 2023-05-19 DIAGNOSIS — M1990 Unspecified osteoarthritis, unspecified site: Secondary | ICD-10-CM | POA: Diagnosis not present

## 2023-05-19 DIAGNOSIS — G4733 Obstructive sleep apnea (adult) (pediatric): Secondary | ICD-10-CM | POA: Diagnosis not present

## 2023-05-19 DIAGNOSIS — M6281 Muscle weakness (generalized): Secondary | ICD-10-CM | POA: Diagnosis not present

## 2023-05-19 DIAGNOSIS — J4550 Severe persistent asthma, uncomplicated: Secondary | ICD-10-CM | POA: Diagnosis not present

## 2023-05-19 DIAGNOSIS — Z7189 Other specified counseling: Secondary | ICD-10-CM | POA: Diagnosis not present

## 2023-05-19 DIAGNOSIS — G4700 Insomnia, unspecified: Secondary | ICD-10-CM | POA: Diagnosis not present

## 2023-05-19 MED ORDER — MECLIZINE HCL 25 MG PO TABS: 25.0000 mg | ORAL_TABLET | Freq: Three times a day (TID) | ORAL | 0 refills | Status: AC | PRN

## 2023-05-19 NOTE — Plan of Care (Signed)
  Problem: Education: Goal: Knowledge of disease or condition will improve Outcome: Progressing Goal: Knowledge of secondary prevention will improve (MUST DOCUMENT ALL) Outcome: Progressing Goal: Knowledge of patient specific risk factors will improve (DELETE if not current risk factor) Outcome: Progressing   Problem: Ischemic Stroke/TIA Tissue Perfusion: Goal: Complications of ischemic stroke/TIA will be minimized Outcome: Progressing   Problem: Coping: Goal: Will verbalize positive feelings about self Outcome: Progressing Goal: Will identify appropriate support needs Outcome: Progressing   Problem: Self-Care: Goal: Ability to participate in self-care as condition permits will improve Outcome: Progressing Goal: Verbalization of feelings and concerns over difficulty with self-care will improve Outcome: Progressing Goal: Ability to communicate needs accurately will improve Outcome: Progressing   Problem: Health Behavior/Discharge Planning: Goal: Ability to manage health-related needs will improve Outcome: Progressing Goal: Goals will be collaboratively established with patient/family Outcome: Progressing   Problem: Education: Goal: Knowledge of General Education information will improve Description: Including pain rating scale, medication(s)/side effects and non-pharmacologic comfort measures Outcome: Progressing   Problem: Health Behavior/Discharge Planning: Goal: Ability to manage health-related needs will improve Outcome: Progressing   Problem: Clinical Measurements: Goal: Ability to maintain clinical measurements within normal limits will improve Outcome: Progressing Goal: Will remain free from infection Outcome: Progressing Goal: Diagnostic test results will improve Outcome: Progressing Goal: Respiratory complications will improve Outcome: Progressing Goal: Cardiovascular complication will be avoided Outcome: Progressing

## 2023-05-19 NOTE — Progress Notes (Signed)
 Patient report given to LPN [facility]. All questions are answered via phone. No any questions at this time.

## 2023-05-19 NOTE — Discharge Summary (Signed)
 Physician Discharge Summary   Patient: Dorothy Church MRN: [id number] DOB: Jul 20, 1935  Admit date:     05/16/2023  Discharge date: 05/19/23  Discharge Physician: Marcelino Duster   PCP: Dana Allan, MD   Recommendations at discharge:    PCP follow up in 1 week. ENT evaluation for positional vertigo. Ophthalmology follow up as scheduled.  Discharge Diagnoses: Principal Problem:   Dizziness Active Problems:   Asthma, chronic, severe persistent, uncomplicated   Obstructive sleep apnea on CPAP   Hypothyroidism   Osteoporosis   OA (osteoarthritis)   Orthostatic hypotension  Resolved Problems:   * No resolved hospital problems. *  Hospital Course: Dorothy Church is a 88 y.o. female with medical history significant for asthma, OSA on CPAP, followed by pulmonology, hypothyroidism generalized osteoarthritis, osteoporosis on Reclast infusions who was brought in by EMS with generalized weakness, dizziness, tremors and imbalance.  She denies headache, vision change, one-sided weakness numbness or tingling.    Patient is admitted to hospitalist service for further management evaluation of dizziness.  Her MRI negative, CTA head neck showed age-indeterminate lacunar infarct.  Neurology advised no further workup.  Patient is started on meclizine therapy with plan for ENT evaluation outpatient.  Patient is seen by PT was noted to be orthostatic started on IV hydration. She is hemodynamically stable to be discharged to [facility]. Advised to follow up PCP in 1 week. Advised ENT eval for positional vertigo and ophthal evaluation for her vision check.  Assessment and Plan: * Dizziness Positional vertigo Chronic tremors Orthostatic positive CTA head and neck showing age-indeterminate lacunar infarct, MRI negative for acute intracranial abnormality. Had echo 01/2023 showed EF 55 to 60%, G1 DD, no aortic stenosis Continuous cardiac monitoring, neurology did not recommend any further  workup. She got gentle IV fluids. Orthostatics improved. PT OT advised SNF. She can go to [facility], PCP follow up in 1 week. Meclizine as needed prescribed. Advised ENT evaluation for her positional vertigo symptoms/ Fall, aspiration precautions.   Asthma, chronic, severe persistent, uncomplicated Not acutely exacerbated Continue home inhalers with DuoNebs as needed   OA (osteoarthritis) Continue Tylenol as needed   Osteoporosis Follows with endocrinology.  On Reclast infusions   Hypothyroidism Continue levothyroxine   Obstructive sleep apnea on CPAP CPAP nightly       Consultants: none Procedures performed: none  Disposition: Skilled nursing facility Diet recommendation:  Discharge Diet Orders (From admission, onward)     Start     Ordered   05/19/23 0000  Diet - low sodium heart healthy        05/19/23 1152           Cardiac diet DISCHARGE MEDICATION: Allergies as of 05/19/2023       Reactions   Amoxicillin Other (See Comments)   Pencillins   Cefzil [cefprozil] Other (See Comments)   Unknown   Cephalexin Other (See Comments)   Unknown   Propoxyphene Other (See Comments)   Unknown   Relafen [nabumetone] Other (See Comments)   Unknown   Talwin [pentazocine] Other (See Comments)   Unknown   Penicillins Other (See Comments)   Cannot remember reaction        Medication List     TAKE these medications    acetaminophen 500 MG tablet Commonly known as: TYLENOL Take 1,000 mg by mouth every 8 (eight) hours as needed (PAIN).   albuterol 108 (90 Base) MCG/ACT inhaler Commonly known as: Ventolin HFA Inhale 2 puffs into the lungs every 6 (  six) hours as needed for wheezing.   aspirin EC 81 MG tablet Take 1 tablet (81 mg total) by mouth daily. Swallow whole.   Breztri Aerosphere 160-9-4.8 MCG/ACT Aero Generic drug: budeson-glycopyrrolate-formoterol Inhale 2 puffs into the lungs in the morning and at bedtime.   calcium carbonate 500 MG chewable  tablet Commonly known as: TUMS - dosed in mg elemental calcium Chew 1 tablet by mouth 4 (four) times daily as needed for indigestion or heartburn.   latanoprost 0.005 % ophthalmic solution Commonly known as: XALATAN Place 1 drop into the right eye at bedtime.   levothyroxine 50 MCG tablet Commonly known as: SYNTHROID TAKE 1 TABLET BY MOUTH EVERY DAY BEFORE BREAKFAST   loratadine 10 MG tablet Commonly known as: CLARITIN TAKE 1 TABLET BY MOUTH EVERY DAY AS NEEDED FOR ALLERGY What changed: See the new instructions.   meclizine 25 MG tablet Commonly known as: ANTIVERT Take 1 tablet (25 mg total) by mouth 3 (three) times daily as needed for dizziness.   pantoprazole 40 MG tablet Commonly known as: PROTONIX TAKE 1 TABLET BY MOUTH EVERY DAY   PARoxetine 40 MG tablet Commonly known as: PAXIL TAKE 1 TABLET BY MOUTH EVERY DAY IN THE MORNING   simvastatin 20 MG tablet Commonly known as: ZOCOR TAKE 1 TABLET BY MOUTH EVERYDAY AT BEDTIME   Vitamin D3 50 MCG (2000 UT) capsule Take 1 capsule (2,000 Units total) by mouth daily.        Discharge Exam: Filed Weights   05/17/23 0356  Weight: 67.6 kg      05/19/2023    9:45 AM 05/19/2023    4:53 AM 05/19/2023    1:07 AM  Vitals with BMI  Systolic 92 112 112  Diastolic 55 61 62  Pulse 95 81 90    General - Elderly Caucasian female, no apparent distress HEENT - PERRLA, EOMI, atraumatic head, non tender sinuses. Lung - Clear, no rales, rhonchi, wheezes. Heart - S1, S2 heard, no murmurs, rubs, trace pedal edema. Abdomen - Soft, non tender, bowel sounds good Neuro - Alert, awake and oriented, non focal exam. Skin - Warm and dry.  Condition at discharge: stable  The results of significant diagnostics from this hospitalization (including imaging, microbiology, ancillary and laboratory) are listed below for reference.   Imaging Studies: MR BRAIN WO CONTRAST Result Date: 05/17/2023 CLINICAL DATA:  Neuro deficit, acute, stroke  suspected EXAM: MRI HEAD WITHOUT CONTRAST TECHNIQUE: Multiplanar, multiecho pulse sequences of the brain and surrounding structures were obtained without intravenous contrast. COMPARISON:  CTA head/neck from earlier today. FINDINGS: Brain: No acute infarction, hemorrhage, hydrocephalus, extra-axial collection or mass lesion. Small remote left basal ganglia infarct. Vascular: Normal flow voids. Skull and upper cervical spine: Normal marrow signal. Sinuses/Orbits: Mostly clear sinuses.  No acute orbital findings. Other: No mastoid effusions. IMPRESSION: No evidence of acute intracranial abnormality. Electronically Signed   By: Feliberto Harts M.D.   On: 05/17/2023 02:53   CT Angio Head Neck W WO CM Result Date: 05/17/2023 CLINICAL DATA:  Neuro deficit, acute, stroke suspected Vertigo, central EXAM: CT ANGIOGRAPHY HEAD AND NECK WITH AND WITHOUT CONTRAST TECHNIQUE: Multidetector CT imaging of the head and neck was performed using the standard protocol during bolus administration of intravenous contrast. Multiplanar CT image reconstructions and MIPs were obtained to evaluate the vascular anatomy. Carotid stenosis measurements (when applicable) are obtained utilizing NASCET criteria, using the distal internal carotid diameter as the denominator. RADIATION DOSE REDUCTION: This exam was performed according to the departmental dose-optimization  program which includes automated exposure control, adjustment of the mA and/or kV according to patient size and/or use of iterative reconstruction technique. CONTRAST:  75mL OMNIPAQUE IOHEXOL 350 MG/ML SOLN COMPARISON:  CT head April 07, 2020. FINDINGS: CT HEAD FINDINGS Brain: Small age-indeterminate lacunar infarct in left basal ganglia. No evidence of acute large vascular territory infarction, hemorrhage, hydrocephalus, extra-axial collection or mass lesion/mass effect. Vascular: See below. Skull: No acute fracture. Sinuses/Orbits: Mostly clear sinuses.  No acute orbital  findings. Review of the MIP images confirms the above findings CTA NECK FINDINGS Aortic arch: Great vessel origins are patent. Right carotid system: No evidence of dissection, stenosis (50% or greater), or occlusion. Left carotid system: No evidence of dissection, stenosis (50% or greater), or occlusion. Vertebral arteries: Right dominant. No evidence of dissection, stenosis (50% or greater), or occlusion. Skeleton: No acute abnormality on limited assessment. Other neck: No acute abnormality on limited assessment. Upper chest: Visualized lung apices are clear. Review of the MIP images confirms the above findings CTA HEAD FINDINGS Anterior circulation: Bilateral intracranial ICAs, MCAs, and ACAs are patent without proximal hemodynamically significant stenosis. Posterior circulation: Bilateral intradural vertebral arteries, basilar artery and bilateral posterior cerebral arteries are patent without proximal hemodynamically significant stenosis. Fenestration of the basilar artery, anatomic variant. Venous sinuses: As permitted by contrast timing, patent. Review of the MIP images confirms the above findings IMPRESSION: 1. No emergent large vessel occlusion or proximal hemodynamically significant stenosis. 2. Small age-indeterminate lacunar infarct in the left basal ganglia. MRI could provide more sensitive evaluation for acute infarct if clinically warranted. Electronically Signed   By: Feliberto Harts M.D.   On: 05/17/2023 01:37   DG Chest Port 1 View Result Date: 05/17/2023 CLINICAL DATA:  gen weakness, infx wu EXAM: PORTABLE CHEST 1 VIEW COMPARISON:  Chest x-ray 12/12/2022, CT chest 05/31/2014 FINDINGS: The heart and mediastinal contours are within normal limits. No focal consolidation. No pulmonary edema. No pleural effusion. No pneumothorax. No acute osseous abnormality. IMPRESSION: No active disease. Electronically Signed   By: Tish Frederickson M.D.   On: 05/17/2023 00:06    Microbiology: Results for  orders placed or performed during the hospital encounter of 05/16/23  Resp panel by RT-PCR (RSV, Flu A&B, Covid) Anterior Nasal Swab     Status: None   Collection Time: 05/16/23 11:44 PM   Specimen: Anterior Nasal Swab  Result Value Ref Range Status   SARS Coronavirus 2 by RT PCR NEGATIVE NEGATIVE Final    Comment: (NOTE) SARS-CoV-2 target nucleic acids are NOT DETECTED.  The SARS-CoV-2 RNA is generally detectable in upper respiratory specimens during the acute phase of infection. The lowest concentration of SARS-CoV-2 viral copies this assay can detect is 138 copies/mL. A negative result does not preclude SARS-Cov-2 infection and should not be used as the sole basis for treatment or other patient management decisions. A negative result may occur with  improper specimen collection/handling, submission of specimen other than nasopharyngeal swab, presence of viral mutation(s) within the areas targeted by this assay, and inadequate number of viral copies(<138 copies/mL). A negative result must be combined with clinical observations, patient history, and epidemiological information. The expected result is Negative.  Fact Sheet for Patients:  BloggerCourse.com  Fact Sheet for Healthcare Providers:  SeriousBroker.it  This test is no t yet approved or cleared by the [place] FDA and  has been authorized for detection and/or diagnosis of SARS-CoV-2 by FDA under an Emergency Use Authorization (EUA). This EUA will remain  in effect (meaning this  test can be used) for the duration of the COVID-19 declaration under Section 564(b)(1) of the Act, 21 U.S.C.section 360bbb-3(b)(1), unless the authorization is terminated  or revoked sooner.       Influenza A by PCR NEGATIVE NEGATIVE Final   Influenza B by PCR NEGATIVE NEGATIVE Final    Comment: (NOTE) The Xpert Xpress SARS-CoV-2/FLU/RSV plus assay is intended as an aid in the diagnosis of  influenza from Nasopharyngeal swab specimens and should not be used as a sole basis for treatment. Nasal washings and aspirates are unacceptable for Xpert Xpress SARS-CoV-2/FLU/RSV testing.  Fact Sheet for Patients: BloggerCourse.com  Fact Sheet for Healthcare Providers: SeriousBroker.it  This test is not yet approved or cleared by the [place] FDA and has been authorized for detection and/or diagnosis of SARS-CoV-2 by FDA under an Emergency Use Authorization (EUA). This EUA will remain in effect (meaning this test can be used) for the duration of the COVID-19 declaration under Section 564(b)(1) of the Act, 21 U.S.C. section 360bbb-3(b)(1), unless the authorization is terminated or revoked.     Resp Syncytial Virus by PCR NEGATIVE NEGATIVE Final    Comment: (NOTE) Fact Sheet for Patients: BloggerCourse.com  Fact Sheet for Healthcare Providers: SeriousBroker.it  This test is not yet approved or cleared by the [place] FDA and has been authorized for detection and/or diagnosis of SARS-CoV-2 by FDA under an Emergency Use Authorization (EUA). This EUA will remain in effect (meaning this test can be used) for the duration of the COVID-19 declaration under Section 564(b)(1) of the Act, 21 U.S.C. section 360bbb-3(b)(1), unless the authorization is terminated or revoked.  Performed at [facility], [address] [place], [place] [address]     Labs: CBC: Recent Labs  Lab 05/16/23 2344  WBC 7.1  NEUTROABS 5.1  HGB 12.6  HCT 37.0  MCV 88.5  PLT 215   Basic Metabolic Panel: Recent Labs  Lab 05/16/23 2344  NA 139  K 3.6  CL 106  CO2 25  GLUCOSE 107*  BUN 10  CREATININE 0.72  CALCIUM 9.3   Liver Function Tests: Recent Labs  Lab 05/16/23 2344  AST 14*  ALT 10  ALKPHOS 39  BILITOT 0.6  PROT 6.2*  ALBUMIN 3.6   CBG: No results for  input(s): "GLUCAP" in the last 168 hours.  Discharge time spent: 34 minutes.  Signed: Marcelino Duster, MD [facility] 05/19/2023

## 2023-05-19 NOTE — TOC Transition Note (Signed)
 Transition of Care [facility]) - Discharge Note   Patient Details  Name: Dorothy Church MRN: [id number] Date of Birth: [birth year]  [facility] CM/SW Contact:  Liliana Cline, LCSW Phone Number: 05/19/2023, 11:58 AM   Clinical Narrative:    Discharge to [facility] SNF today. Room 115. Confirmed with Admissions Worker Sue Lush. Updated MD, RN, and patient. Patient's friend Britta Mccreedy will transport her to [facility]. Confirmed with Britta Mccreedy and patient. Asked RN to call report.   Final next level of care: Skilled Nursing Facility Barriers to Discharge: Barriers Resolved   Patient Goals and CMS Choice Patient states their goals for this hospitalization and ongoing recovery are:: [facility] [email or web address].gov Compare Post Acute Care list provided to:: Patient Choice offered to / list presented to : Patient      Discharge Placement              Patient chooses bed at: [facility] Patient to be transferred to facility by: Britta Mccreedy - friend Name of family member notified: patient Patient and family notified of of transfer: 05/19/23  Discharge Plan and Services Additional resources added to the After Visit Summary for     Discharge Planning Services: CM Consult Post Acute Care Choice: Skilled Nursing Facility (Wants to go to [facility] or respite)                               Social Drivers of Health (SDOH) Interventions SDOH Screenings   Food Insecurity: No Food Insecurity (05/18/2023)  Housing: Low Risk  (05/18/2023)  Transportation Needs: No Transportation Needs (05/18/2023)  Utilities: Not At Risk (05/18/2023)  Alcohol Screen: Low Risk  (03/21/2023)  Depression (PHQ2-9): Medium Risk (10/08/2022)  Financial Resource Strain: Low Risk  (03/21/2023)  Physical Activity: Inactive (03/21/2023)  Social Connections: Moderately Integrated (05/18/2023)  Stress: Stress Concern Present (03/21/2023)  Tobacco Use: Low Risk  (05/16/2023)     Readmission Risk  Interventions     No data to display

## 2023-05-19 NOTE — TOC Progression Note (Signed)
 Transition of Care [facility]) - Progression Note    Patient Details  Name: Dorothy Church MRN: [id number] Date of Birth: [birth year]  Transition of Care [facility]) CM/SW Contact  Liliana Cline, LCSW Phone Number: 05/19/2023, 9:07 AM  Clinical Narrative:    [facility]erkley Harvey is approved in [place] for [facility]. CSW reached out to Sue Lush at [facility] to inquire if patient can come there today - awaiting response.   Expected Discharge Plan: Assisted Living Barriers to Discharge: Continued Medical Work up  Expected Discharge Plan and Services   Discharge Planning Services: CM Consult Post Acute Care Choice: Skilled Nursing Facility (Wants to go to [facility] or respite) Living arrangements for the past 2 months: Independent Living Facility                                       Social Determinants of Health (SDOH) Interventions SDOH Screenings   Food Insecurity: No Food Insecurity (05/18/2023)  Housing: Low Risk  (05/18/2023)  Transportation Needs: No Transportation Needs (05/18/2023)  Utilities: Not At Risk (05/18/2023)  Alcohol Screen: Low Risk  (03/21/2023)  Depression (PHQ2-9): Medium Risk (10/08/2022)  Financial Resource Strain: Low Risk  (03/21/2023)  Physical Activity: Inactive (03/21/2023)  Social Connections: Moderately Integrated (05/18/2023)  Stress: Stress Concern Present (03/21/2023)  Tobacco Use: Low Risk  (05/16/2023)    Readmission Risk Interventions     No data to display

## 2023-05-20 ENCOUNTER — Encounter: Payer: Self-pay | Admitting: Student

## 2023-05-20 ENCOUNTER — Non-Acute Institutional Stay (SKILLED_NURSING_FACILITY): Admitting: Student

## 2023-05-20 DIAGNOSIS — K219 Gastro-esophageal reflux disease without esophagitis: Secondary | ICD-10-CM

## 2023-05-20 DIAGNOSIS — J4550 Severe persistent asthma, uncomplicated: Secondary | ICD-10-CM

## 2023-05-20 DIAGNOSIS — F419 Anxiety disorder, unspecified: Secondary | ICD-10-CM

## 2023-05-20 DIAGNOSIS — G4700 Insomnia, unspecified: Secondary | ICD-10-CM | POA: Diagnosis not present

## 2023-05-20 DIAGNOSIS — H8110 Benign paroxysmal vertigo, unspecified ear: Secondary | ICD-10-CM | POA: Diagnosis not present

## 2023-05-20 DIAGNOSIS — Z7189 Other specified counseling: Secondary | ICD-10-CM | POA: Diagnosis not present

## 2023-05-20 DIAGNOSIS — F331 Major depressive disorder, recurrent, moderate: Secondary | ICD-10-CM

## 2023-05-20 DIAGNOSIS — I951 Orthostatic hypotension: Secondary | ICD-10-CM

## 2023-05-20 NOTE — Progress Notes (Signed)
 Provider:  Sydnee Cabal Location:  Other Nursing Home Room Number: [place] [facility] Place of Service:  SNF ([phone])  PCP: Dana Allan, MD Patient Care Team: Dana Allan, MD as PCP - General (Family Medicine) Emi Belfast, FNP (Nurse Practitioner) Salena Saner, MD as Consulting Physician (Pulmonary Disease)  Extended Emergency Contact Information Primary Emergency Contact: [facility] Phone: [phone] Relation: Friend  Code Status: Full Code  Goals of Care: Advanced Directive information    05/18/2023    9:40 PM  Advanced Directives  Does Patient Have a Medical Advance Directive? Yes  Type of Advance Directive Living will  Does patient want to make changes to medical advance directive? No - Guardian declined      Chief Complaint  Patient presents with   Admission    HPI: Patient is a 88 y.o. female seen today for admission to Discussed the use of AI scribe software for clinical note transcription with the patient, who gave verbal consent to proceed.  History of Present Illness         History of Present Illness The patient presents with lightheadedness and shaking episodes.  She experiences episodes of shaking and lightheadedness, often accompanied by headaches. These episodes have led her to use an emergency alert button for assistance. She has a history of low blood pressure, which she believes contributes to her symptoms, particularly in the mornings. She has experienced falls, including a recent incident in early March where she sustained bruises but no significant injuries. She attributes the fall to her right knee occasionally becoming weak.  She has been experiencing increased emotional distress, including frequent crying, which she attributes to the death of her sister about six months ago and other family issues. Her daughter's emotional problems add to her stress.  Her diet is poor as she does not cook and often relies on simple meals such as  Cheerios with blueberries for breakfast and Boost for lunch. She acknowledges not drinking enough fluids, which may contribute to her low blood pressure.  She takes several medications, including aspirin 81 mg daily for a past stroke, Breztri inhaler for asthma, Claritin as needed, and Synthroid 50 mcg for thyroid issues. She also takes Paxil for mood, which she has been on for a long time, and simvastatin for cholesterol. She uses Tylenol and Ventolin as needed, and vitamin D3 and latanoprost for her eyes.  She experiences acid reflux, for which she takes Tums three to four times a night to alleviate burning sensations. She recalls receiving Protonix in the hospital, which provided temporary relief.  Results RADIOLOGY CTA: Older lacunar strokes MRI: Negative for acute changes    Past Medical History:  Diagnosis Date   Abdominal pain    Abnormal CT of liver    Accident due to mechanical fall without injury 01/28/2022   Acute pain of left shoulder 09/28/2020   Anxiety    Anxiety 04/28/2020   Aortic stenosis    Aortic stenosis 03/04/2012   Overview:   Not confirmed by Dr Mariah Milling.    Aortic valve disorders    Arthritis    Asthma    Asthma 08/07/2012   Barrett's esophagus    [facility] last EGD 02/2018 Dr. Marva Panda    Barrett's esophagus 03/11/2017   Overview:  Dr Marva Panda   Carotid artery stenosis 12/30/2018   1-39% b/l carotids       Colon polyps    Colon polyps    COPD (chronic obstructive pulmonary disease) (HCC)    DDD (degenerative  disc disease), cervical    DDD (degenerative disc disease), cervical 04/28/2020   DDD (degenerative disc disease), lumbar 01/22/2017   Depressive disorder    Diverticulosis    Erosive esophagitis    Erosive esophagitis 04/28/2020   Fundic gland polyps of stomach, benign    Gastric polyp    Gastric polyps    Gastritis    Generalized osteoarthritis of hand 05/03/2015   GERD (gastroesophageal reflux disease)    GOA (generalized osteoarthritis)  11/23/2015   Goiter    Hiatal hernia    small    Hiatal hernia    History of colonic polyps 12/08/2013   Hypercholesteremia    Hypothyroidism    Impaired fasting glucose    Impaired fasting glucose 03/04/2012   Overview:   Glucose 111 02/2012   Intrinsic asthma, unspecified    Joint pain of ankle and foot, right 05/17/2015   Left rib fracture    Mechanical complication of knee prosthesis, initial encounter (HCC) 08/25/2017   Obstructive sleep apnea (adult) (pediatric)    used cpap x 10 years per Dr. Belia Heman off as of 12/30/18    OSA (obstructive sleep apnea)    used cpap x 10 years per Dr. 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;  Service: Ophthalmology;  Laterality: Right;   COLONOSCOPY     COLONOSCOPY WITH ESOPHAGOGASTRODUODENOSCOPY (EGD)     COLONOSCOPY WITH PROPOFOL N/A 12/14/2014   Procedure: COLONOSCOPY WITH PROPOFOL;  Surgeon: Christena Deem, MD;  Location: [facility] END[facility]COPY;  Service: Endoscopy;  Laterality: N/A;   COLONOSCOPY WITH PROPOFOL N/A 03/18/2018   Procedure: COLONOSCOPY WITH PROPOFOL;  Surgeon: Christena Deem, MD;  Location: [facility] ENDOSCOPY;   Service: Endoscopy;  Laterality: N/A;   ENUCLEATION     ESOPHAGOGASTRODUODENOSCOPY N/A 08/30/2020   Procedure: ESOPHAGOGASTRODUODENOSCOPY (EGD);  Surgeon: Regis Bill, MD;  Location: [facility] ENDOSCOPY;  Service: Endoscopy;  Laterality: N/A;   ESOPHAGOGASTRODUODENOSCOPY (EGD) WITH PROPOFOL N/A 12/14/2014   Procedure: ESOPHAGOGASTRODUODENOSCOPY (EGD) WITH PROPOFOL;  Surgeon: Christena Deem, MD;  Location: [facility] ENDOSCOPY;  Service: Endoscopy;  Laterality: N/A;   ESOPHAGOGASTRODUODENOSCOPY (EGD) WITH PROPOFOL N/A 10/18/2016   Procedure: ESOPHAGOGASTRODUODENOSCOPY (EGD) WITH PROPOFOL;  Surgeon: Christena Deem, MD;  Location: [facility] ENDOSCOPY;  Service: Endoscopy;  Laterality: N/A;   ESOPHAGOGASTRODUODENOSCOPY (EGD) WITH PROPOFOL N/A 03/18/2018   Procedure: ESOPHAGOGASTRODUODENOSCOPY (EGD) WITH PROPOFOL;  Surgeon: Christena Deem, MD;  Location: [facility] ENDOSCOPY;  Service: Endoscopy;  Laterality: N/A;   ESOPHAGOGASTRODUODENOSCOPY (EGD) WITH PROPOFOL N/A 12/19/2020   Procedure: ESOPHAGOGASTRODUODENOSCOPY (EGD) WITH PROPOFOL;  Surgeon: Regis Bill, MD;  Location: [facility] ENDOSCOPY;  Service: Endoscopy;  Laterality: N/A;   EYE SURGERY     FRACTURE SURGERY     HIP SURGERY Left    pin and plate   IR ANGIO INTRA EXTRACRAN SEL COM CAROTID INNOMINATE BILAT MOD SED  10/17/2018   IR ANGIO VERTEBRAL SEL VERTEBRAL BILAT MOD SED  10/17/2018   IR ANGIOGRAM EXTREMITY LEFT  10/17/2018   JOINT REPLACEMENT     Left Total Knee Arthroplasty     Dr Ernest Pine and also had 39 years ago from 2020    nasal polyps     removed x 2 affects sense of smell x 10-15 years from 04/28/20   prosthesis eye implant     REPLACEMENT UNICONDYLAR JOINT KNEE Left    TONSILLECTOMY     TOTAL KNEE REVISION Left 09/13/2017   Procedure: TOTAL KNEE REVISION;  Surgeon: Donato Heinz, MD;  Location: [facility] OR[facility];  Service: Orthopedics;  Laterality: Left;    reports that she has never smoked. She has never used smokeless tobacco.  She reports current alcohol use of about 1.0 standard drink of alcohol per week. She reports that she does not use drugs. Social History   Socioeconomic History   Marital status: Divorced    Spouse name: Not on file   Number of children: Not on file   Years of education: Not on file   Highest education level: Associate degree: occupational, Scientist, product/process development, or vocational program  Occupational History   Not on file  Tobacco Use   Smoking status: Never   Smokeless tobacco: Never  Vaping Use   Vaping status: Never Used  Substance and Sexual Activity   Alcohol use: Yes    Alcohol/week: 1.0 standard drink of alcohol    Types: 1 Glasses of wine per week    Comment: occas. wine   Drug use: No   Sexual activity: Never  Other Topics Concern   Not on file  Social History Narrative   Has twin sister identical lives in [place] does not live with her    Lived in [place], moved to [place] two years ago.   Divorced.      Has a living will.   Would desire CPR, would not desire extraordinary measures.      1 boy and 1 girl daughter [place], son In [place], [place] hes Technical sales engineer   Social Drivers of Health   Financial Resource Strain: Low Risk  (03/21/2023)   Overall Financial Resource Strain (CARDIA)    Difficulty of Paying Living Expenses: Not very hard  Food Insecurity: No Food Insecurity (05/18/2023)   Hunger Vital Sign    Worried About Running Out of Food in the Last Year: Never true    Ran Out of Food in the Last Year: Never true  Transportation Needs: No Transportation Needs (05/18/2023)   PRAPARE - Administrator, Civil Service (Medical): No    Lack of Transportation (Non-Medical): No  Physical Activity: Inactive (03/21/2023)   Exercise Vital Sign    Days of Exercise per Week: 0 days    Minutes of Exercise per Session: 20 min  Stress: Stress Concern Present (03/21/2023)   [employer] of Occupational Health - Occupational Stress Questionnaire     Feeling of Stress : Rather much  Social Connections: Moderately Integrated (05/18/2023)   Social Connection and Isolation Panel [NHANES]    Frequency of Communication with Friends and Family: More than three times a week    Frequency of Social Gatherings with Friends and Family: Twice a  week    Attends Religious Services: More than 4 times per year    Active Member of Clubs or Organizations: Yes    Attends Banker Meetings: More than 4 times per year    Marital Status: Divorced  Intimate Partner Violence: Not At Risk (05/18/2023)   Humiliation, Afraid, Rape, and Kick questionnaire    Fear of Current or Ex-Partner: No    Emotionally Abused: No    Physically Abused: No    Sexually Abused: No    Functional Status Survey:    Family History  Problem Relation Age of Onset   Breast cancer Mother        pagets dx 40s   Heart disease Father    Alcohol abuse Father    Dementia Sister        twin sister 63 dx'ed dementia   Bipolar disorder Brother     Health Maintenance  Topic Date Due   Medicare Annual Wellness (AWV)  06/14/2023   INFLUENZA VACCINE  05/20/2023 (Originally 09/20/2022)   COVID-19 Vaccine (8 - 2024-25 season) 06/02/2023 (Originally 10/21/2022)   DTaP/Tdap/Td (2 - Tdap) 04/17/2026   Pneumonia Vaccine 78+ Years old  Completed   DEXA SCAN  Completed   Zoster Vaccines- Shingrix  Completed   HPV VACCINES  Aged Out    Allergies  Allergen Reactions   Amoxicillin Other (See Comments)    Pencillins   Cefzil [Cefprozil] Other (See Comments)    Unknown   Cephalexin Other (See Comments)    Unknown   Propoxyphene Other (See Comments)    Unknown   Relafen [Nabumetone] Other (See Comments)    Unknown   Talwin [Pentazocine] Other (See Comments)    Unknown   Penicillins Other (See Comments)    Cannot remember reaction    Outpatient Encounter Medications as of 05/20/2023  Medication Sig   acetaminophen (TYLENOL) 500 MG tablet Take 1,000 mg by mouth every 8  (eight) hours as needed (PAIN).   albuterol (VENTOLIN HFA) 108 (90 Base) MCG/ACT inhaler Inhale 2 puffs into the lungs every 6 (six) hours as needed for wheezing.   aspirin EC 81 MG tablet Take 1 tablet (81 mg total) by mouth daily. Swallow whole.   Budeson-Glycopyrrol-Formoterol (BREZTRI AEROSPHERE) 160-9-4.8 MCG/ACT AERO Inhale 2 puffs into the lungs in the morning and at bedtime.   calcium carbonate (TUMS - DOSED IN MG ELEMENTAL CALCIUM) 500 MG chewable tablet Chew 1 tablet by mouth 4 (four) times daily as needed for indigestion or heartburn.   Cholecalciferol (VITAMIN D3) 50 MCG (2000 UT) capsule Take 1 capsule (2,000 Units total) by mouth daily.   latanoprost (XALATAN) 0.005 % ophthalmic solution Place 1 drop into the right eye at bedtime.   levothyroxine (SYNTHROID) 50 MCG tablet TAKE 1 TABLET BY MOUTH EVERY DAY BEFORE BREAKFAST   loratadine (CLARITIN) 10 MG tablet TAKE 1 TABLET BY MOUTH EVERY DAY AS NEEDED FOR ALLERGY (Patient taking differently: Take 10 mg by mouth daily as needed for allergies or rhinitis.)   meclizine (ANTIVERT) 25 MG tablet Take 1 tablet (25 mg total) by mouth 3 (three) times daily as needed for dizziness.   pantoprazole (PROTONIX) 40 MG tablet TAKE 1 TABLET BY MOUTH EVERY DAY   PARoxetine (PAXIL) 40 MG tablet TAKE 1 TABLET BY MOUTH EVERY DAY IN THE MORNING   simvastatin (ZOCOR) 20 MG tablet TAKE 1 TABLET BY MOUTH EVERYDAY AT BEDTIME   No facility-administered encounter medications on file as of 05/20/2023.    Review of  Systems  Vitals:   05/20/23 0904  BP: 120/67  Pulse: 84  Resp: 18  Temp: (!) 97.5 F (36.4 C)  SpO2: 95%  Weight: 143 lb 11.2 oz (65.2 kg)   Body mass index is 23.19 kg/m. Physical Exam Constitutional:      Appearance: Normal appearance.  Cardiovascular:     Rate and Rhythm: Normal rate and regular rhythm.     Pulses: Normal pulses.     Heart sounds: Normal heart sounds.  Pulmonary:     Effort: Pulmonary effort is normal.  Abdominal:      General: Abdomen is flat. Bowel sounds are normal.     Palpations: Abdomen is soft.  Musculoskeletal:        General: No swelling or tenderness.  Skin:    General: Skin is warm and dry.     Capillary Refill: Capillary refill takes 2 to 3 seconds.  Neurological:     Mental Status: She is alert and oriented to person, place, and time.     Gait: Gait normal.  Psychiatric:        Mood and Affect: Mood normal.    Physical Exam          Labs reviewed: Basic Metabolic Panel: Recent Labs    06/11/22 1415 05/16/23 2344  NA 140 139  K 4.3 3.6  CL 101 106  CO2 32 25  GLUCOSE 90 107*  BUN 17 10  CREATININE 0.92 0.72  CALCIUM 9.3 9.3   Liver Function Tests: Recent Labs    06/11/22 1415 05/16/23 2344  AST 15 14*  ALT 11 10  ALKPHOS 38* 39  BILITOT 0.6 0.6  PROT 6.0 6.2*  ALBUMIN 4.0 3.6   No results for input(s): "LIPASE", "AMYLASE" in the last 8760 hours. No results for input(s): "AMMONIA" in the last 8760 hours. CBC: Recent Labs    06/11/22 1415 12/12/22 1247 05/16/23 2344  WBC 5.6 5.2 7.1  NEUTROABS 3.9 3.5 5.1  HGB 13.5 13.7 12.6  HCT 40.1 40.9 37.0  MCV 93.1 92.7 88.5  PLT 206.0 216 215   Cardiac Enzymes: No results for input(s): "CKTOTAL", "CKMB", "CKMBINDEX", "TROPONINI" in the last 8760 hours. BNP: Invalid input(s): "POCBNP" Lab Results  Component Value Date   HGBA1C 5.1 05/17/2023   Lab Results  Component Value Date   TSH 0.916 05/16/2023   Lab Results  Component Value Date   VITAMINB12 314 10/17/2021   No results found for: "FOLATE" No results found for: "IRON", "TIBC", "FERRITIN" Results           Imaging and Procedures obtained prior to SNF admission: MR BRAIN WO CONTRAST Result Date: 05/17/2023 CLINICAL DATA:  Neuro deficit, acute, stroke suspected EXAM: MRI HEAD WITHOUT CONTRAST TECHNIQUE: Multiplanar, multiecho pulse sequences of the brain and surrounding structures were obtained without intravenous contrast. COMPARISON:  CTA  head/neck from earlier today. FINDINGS: Brain: No acute infarction, hemorrhage, hydrocephalus, extra-axial collection or mass lesion. Small remote left basal ganglia infarct. Vascular: Normal flow voids. Skull and upper cervical spine: Normal marrow signal. Sinuses/Orbits: Mostly clear sinuses.  No acute orbital findings. Other: No mastoid effusions. IMPRESSION: No evidence of acute intracranial abnormality. Electronically Signed   By: Feliberto Harts M.D.   On: 05/17/2023 02:53   CT Angio Head Neck W WO CM Result Date: 05/17/2023 CLINICAL DATA:  Neuro deficit, acute, stroke suspected Vertigo, central EXAM: CT ANGIOGRAPHY HEAD AND NECK WITH AND WITHOUT CONTRAST TECHNIQUE: Multidetector CT imaging of the head and neck was performed using  the standard protocol during bolus administration of intravenous contrast. Multiplanar CT image reconstructions and MIPs were obtained to evaluate the vascular anatomy. Carotid stenosis measurements (when applicable) are obtained utilizing NASCET criteria, using the distal internal carotid diameter as the denominator. RADIATION DOSE REDUCTION: This exam was performed according to the departmental dose-optimization program which includes automated exposure control, adjustment of the mA and/or kV according to patient size and/or use of iterative reconstruction technique. CONTRAST:  75mL OMNIPAQUE IOHEXOL 350 MG/ML SOLN COMPARISON:  CT head April 07, 2020. FINDINGS: CT HEAD FINDINGS Brain: Small age-indeterminate lacunar infarct in left basal ganglia. No evidence of acute large vascular territory infarction, hemorrhage, hydrocephalus, extra-axial collection or mass lesion/mass effect. Vascular: See below. Skull: No acute fracture. Sinuses/Orbits: Mostly clear sinuses.  No acute orbital findings. Review of the MIP images confirms the above findings CTA NECK FINDINGS Aortic arch: Great vessel origins are patent. Right carotid system: No evidence of dissection, stenosis (50% or  greater), or occlusion. Left carotid system: No evidence of dissection, stenosis (50% or greater), or occlusion. Vertebral arteries: Right dominant. No evidence of dissection, stenosis (50% or greater), or occlusion. Skeleton: No acute abnormality on limited assessment. Other neck: No acute abnormality on limited assessment. Upper chest: Visualized lung apices are clear. Review of the MIP images confirms the above findings CTA HEAD FINDINGS Anterior circulation: Bilateral intracranial ICAs, MCAs, and ACAs are patent without proximal hemodynamically significant stenosis. Posterior circulation: Bilateral intradural vertebral arteries, basilar artery and bilateral posterior cerebral arteries are patent without proximal hemodynamically significant stenosis. Fenestration of the basilar artery, anatomic variant. Venous sinuses: As permitted by contrast timing, patent. Review of the MIP images confirms the above findings IMPRESSION: 1. No emergent large vessel occlusion or proximal hemodynamically significant stenosis. 2. Small age-indeterminate lacunar infarct in the left basal ganglia. MRI could provide more sensitive evaluation for acute infarct if clinically warranted. Electronically Signed   By: Feliberto Harts M.D.   On: 05/17/2023 01:37   DG Chest Port 1 View Result Date: 05/17/2023 CLINICAL DATA:  gen weakness, infx wu EXAM: PORTABLE CHEST 1 VIEW COMPARISON:  Chest x-ray 12/12/2022, CT chest 05/31/2014 FINDINGS: The heart and mediastinal contours are within normal limits. No focal consolidation. No pulmonary edema. No pleural effusion. No pneumothorax. No acute osseous abnormality. IMPRESSION: No active disease. Electronically Signed   By: Tish Frederickson M.D.   On: 05/17/2023 00:06    Assessment/Plan Insomnia Chronic insomnia persists despite trials of trazodone and melatonin. Mirtazapine is considered for its sedative effects. Discussed side effects, including weight gain, sedation, potential initial  mood worsening, and cardiotoxicity risk with prolonged use. Informed consent obtained. - Start mirtazapine 15 mg for insomnia and mood improvement  Depression and Anxiety Depressive symptoms, including increased crying, have persisted for six months following the death of a sister. Paroxetine is ineffective. Mirtazapine is considered for mood improvement, with effects expected in 4-6 weeks. Discussed potential initial mood worsening and cardiotoxicity risk with prolonged use. Informed consent obtained. - Start mirtazapine 15 mg for mood improvement - Consider cross-tapering from paroxetine to Lexapro if mirtazapine is ineffective  BPPV  possible Orthostatic hypotension  Hx of stroke Episodes of lightheadedness and hypotension, particularly in the morning, possibly due to inadequate fluid and protein intake. Discussed importance of hydration and protein intake. Compression stockings considered if symptoms persist. Imaging negative for acute stroke, hx of age-indeterminant lacunar strokes. - Continue ASA.  - Encourage increased fluid intake to at least six clear cups or three to four Styrofoam cups  per day - Consider compression stockings if symptoms persist - PT to aid with Epley Maneuver.  Nutritional Deficiency Poor dietary habits with insufficient protein intake, potentially contributing to orthostatic hypotension and overall health decline. Limited food options due to not cooking. Discussed importance of protein intake for fluid balance and blood pressure maintenance. - Encourage a diet with at least 30 grams of protein per meal - Consider supplementation with Ensure or Boost to meet protein requirements  Acid Reflux Hx of Hiatal hernia Frequent acid reflux episodes, currently managed with Tums. Protonix provided temporary relief. Famotidine considered for regular management. - Start famotidine for acid reflux management  Fall Risk Recent fall in early March with bruises, possibly due  to knee weakness. Limited mobility and occasional knee weakness noted. - Assess mobility needs  Goals of Care Preference for no extraordinary measures, including no resuscitation or intubation, in cardiac arrest. Wishes consistent with DNR status discussed, highlighting low survival and recovery likelihood in individuals over 85. - Discuss advance directives with primary care provider to formalize DNR status as patient is undecided at this time.  Follow-up Plans for ongoing care and medication adjustments. - Schedule follow-up appointment with ENT for further evaluation of positional changes - Plan follow-up in approximately two weeks to assess response to new medications   Family/ staff Communication: Nursing  Labs/tests ordered: CBC, BMP  I spent greater than 45  minutes for the care of this patient in face to face time, chart review, clinical documentation, patient education. I spent an additional 16 minutes discussing goals of care and advanced care planning.

## 2023-05-22 ENCOUNTER — Ambulatory Visit

## 2023-05-30 ENCOUNTER — Non-Acute Institutional Stay (SKILLED_NURSING_FACILITY): Payer: Self-pay | Admitting: Nurse Practitioner

## 2023-05-30 DIAGNOSIS — E039 Hypothyroidism, unspecified: Secondary | ICD-10-CM

## 2023-05-30 DIAGNOSIS — K219 Gastro-esophageal reflux disease without esophagitis: Secondary | ICD-10-CM

## 2023-05-30 DIAGNOSIS — F331 Major depressive disorder, recurrent, moderate: Secondary | ICD-10-CM

## 2023-05-30 DIAGNOSIS — H8110 Benign paroxysmal vertigo, unspecified ear: Secondary | ICD-10-CM | POA: Diagnosis not present

## 2023-05-30 DIAGNOSIS — G4700 Insomnia, unspecified: Secondary | ICD-10-CM

## 2023-05-30 DIAGNOSIS — J4540 Moderate persistent asthma, uncomplicated: Secondary | ICD-10-CM

## 2023-05-30 DIAGNOSIS — I951 Orthostatic hypotension: Secondary | ICD-10-CM

## 2023-05-30 MED ORDER — ESCITALOPRAM OXALATE 10 MG PO TABS: 10.0000 mg | ORAL_TABLET | Freq: Every day | ORAL | 0 refills | Status: DC

## 2023-05-30 MED ORDER — MIRTAZAPINE 15 MG PO TABS: 15.0000 mg | ORAL_TABLET | Freq: Every day | ORAL | 0 refills | Status: DC

## 2023-05-30 MED ORDER — PANTOPRAZOLE SODIUM 40 MG PO TBEC: 40.0000 mg | DELAYED_RELEASE_TABLET | Freq: Two times a day (BID) | ORAL | 0 refills | Status: DC

## 2023-05-30 NOTE — Progress Notes (Signed)
 Location:  Other ([facility] Room Number: 112A Place of Service:  SNF (31)  [name], MD  Patient Care Team: [name], MD as PCP - General (Family Medicine) Steven Elam, FNP (Nurse Practitioner) Marc Senior, MD as Consulting Physician (Pulmonary Disease)  Extended Emergency Contact Information Primary Emergency Contact: [facility] Phone: [phone] Relation: Friend  Goals of care: Advanced Directive information    05/18/2023    9:40 PM  Advanced Directives  Does Patient Have a Medical Advance Directive? Yes  Type of Advance Directive Living will  Does patient want to make changes to medical advance directive? No - Guardian declined     Chief Complaint  Patient presents with   Discharge Note    HPI:  Pt is a 88 y.o. female seen today for discharge home.   She experienced dizziness, weakness, and a feeling of unsteadiness, which led to a hospital visit. Imaging studies, including MRI and CT of the head, were negative. Symptoms of dizziness and vertigo have improved, but she still feels 'spacey', which she suspects may be related to her glasses prescription being incorrect after cataract surgery.  She has a history of cataract surgery and suspects that her current 'spacey' feeling and difficulty focusing may be related to an incorrect glasses prescription. The axis of the glasses was previously found to be off.  She has a history of anxiety and depression, which have improved during her rehab stay due to not watching the news, identified as a trigger. She is currently on Remeron and Lexapro, which have helped improve mood and sleep. She reports sleeping better and feeling less anxious.  She experiences chronic indigestion and acid reflux, for which she has been prescribed Protonix twice daily. She also takes Tums at night if needed. This issue has been ongoing for years, and she has a gastroenterologist for further  management.  Her asthma is manageable, using an inhaler in the morning and a rescue inhaler a couple of times a week.  She is on Synthroid 50 mcg for hypothyroidism  She acknowledges not eating well at home and plans to order meals from a [facility] service to improve nutrition.   Past Medical History:  Diagnosis Date   Abdominal pain    Abnormal CT of liver    Accident due to mechanical fall without injury 01/28/2022   Acute pain of left shoulder 09/28/2020   Anxiety    Anxiety 04/28/2020   Aortic stenosis    Aortic stenosis 03/04/2012   Overview:   Not confirmed by Dr Gollan.    Aortic valve disorders    Arthritis    Asthma    Asthma 08/07/2012   Barrett's esophagus    [facility] last EGD 02/2018 Dr. Peg Bouton    Barrett's esophagus 03/11/2017   Overview:  Dr Peg Bouton   Carotid artery stenosis 12/30/2018   1-39% b/l carotids       Colon polyps    Colon polyps    COPD (chronic obstructive pulmonary disease) (HCC)    DDD (degenerative disc disease), cervical    DDD (degenerative disc disease), cervical 04/28/2020   DDD (degenerative disc disease), lumbar 01/22/2017   Depressive disorder    Diverticulosis    Erosive esophagitis    Erosive esophagitis 04/28/2020   Fundic gland polyps of stomach, benign    Gastric polyp    Gastric polyps    Gastritis    Generalized osteoarthritis of hand 05/03/2015   GERD (gastroesophageal reflux disease)  GOA (generalized osteoarthritis) 11/23/2015   Goiter    Hiatal hernia    small    Hiatal hernia    History of colonic polyps 12/08/2013   Hypercholesteremia    Hypothyroidism    Impaired fasting glucose    Impaired fasting glucose 03/04/2012   Overview:   Glucose 111 02/2012   Intrinsic asthma, unspecified    Joint pain of ankle and foot, right 05/17/2015   Left rib fracture    Mechanical complication of knee prosthesis, initial encounter (HCC) 08/25/2017   Obstructive sleep apnea (adult) (pediatric)    used cpap x 10 years per  Dr. Auston Left off as of 12/30/18    OSA (obstructive sleep apnea)    used cpap x 10 years per Dr. 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   Stress 04/28/2020   Syncope and collapse    TIA (transient ischemic attack)    TIA (transient ischemic attack) 12/11/2016   TIA (transient ischemic attack) 12/11/2016   UTI (urinary tract infection)    Vitamin D deficiency    Past Surgical History:  Procedure Laterality Date   BREAST EXCISIONAL BIOPSY Left    BREAST SURGERY     CATARACT EXTRACTION W/PHACO Right 11/07/2021   Procedure: CATARACT EXTRACTION PHACO AND INTRAOCULAR LENS PLACEMENT (IOC) RIGHT  8.87 00:50.8;  Surgeon: Clair Crews, MD;  Location: [facility];  Service: Ophthalmology;  Laterality: Right;   COLONOSCOPY     COLONOSCOPY WITH ESOPHAGOGASTRODUODENOSCOPY (EGD)     COLONOSCOPY WITH PROPOFOL N/A 12/14/2014   Procedure: COLONOSCOPY WITH PROPOFOL;  Surgeon: Deveron Fly, MD;  [facility];  Service: Endoscopy;  Laterality: N/A;   COLONOSCOPY WITH PROPOFOL N/A 03/18/2018   Procedure: COLONOSCOPY WITH PROPOFOL;  Surgeon: Deveron Fly, MD;  Location: [facility] ENDOSCOPY;  Service: Endoscopy;  Laterality: N/A;   ENUCLEATION     ESOPHAGOGASTRODUODENOSCOPY N/A 08/30/2020   Procedure: ESOPHAGOGASTRODUODENOSCOPY (EGD);  Surgeon: Shane Darling, MD;  Location: [facility] END[facility]COPY;  Service: Endoscopy;  Laterality: N/A;   ESOPHAGOGASTRODUODENOSCOPY (EGD) WITH PROPOFOL N/A 12/14/2014   Procedure: ESOPHAGOGASTRODUODENOSCOPY (EGD) WITH PROPOFOL;  Surgeon: Deveron Fly, MD;  Location: [facility] ENDOSCOPY;  Service: Endoscopy;  Laterality: N/A;   ESOPHAGOGASTRODUODENOSCOPY (EGD) WITH  PROPOFOL N/A 10/18/2016   Procedure: ESOPHAGOGASTRODUODENOSCOPY (EGD) WITH PROPOFOL;  Surgeon: Deveron Fly, MD;  Location: [facility] ENDOSCOPY;  Service: Endoscopy;  Laterality: N/A;   ESOPHAGOGASTRODUODENOSCOPY (EGD) WITH PROPOFOL N/A 03/18/2018   Procedure: ESOPHAGOGASTRODUODENOSCOPY (EGD) WITH PROPOFOL;  Surgeon: Deveron Fly, MD;  Location: [facility] ENDOSCOPY;  Service: Endoscopy;  Laterality: N/A;   ESOPHAGOGASTRODUODENOSCOPY (EGD) WITH PROPOFOL N/A 12/19/2020   Procedure: ESOPHAGOGASTRODUODENOSCOPY (EGD) WITH PROPOFOL;  Surgeon: Shane Darling, MD;  Location: [facility] ENDOSCOPY;  Service: Endoscopy;  Laterality: N/A;   EYE SURGERY     FRACTURE SURGERY     HIP SURGERY Left    pin and plate   IR ANGIO INTRA EXTRACRAN SEL COM CAROTID INNOMINATE BILAT MOD SED  10/17/2018   IR ANGIO VERTEBRAL SEL VERTEBRAL BILAT MOD SED  10/17/2018   IR ANGIOGRAM EXTREMITY LEFT  10/17/2018   JOINT REPLACEMENT     Left Total Knee Arthroplasty     Dr  Hooten and also had 39 years ago from 2020    nasal polyps     removed x 2 affects sense of smell x 10-15 years from 04/28/20   prosthesis eye implant     REPLACEMENT UNICONDYLAR JOINT KNEE Left    TONSILLECTOMY     TOTAL KNEE REVISION Left 09/13/2017   Procedure: TOTAL KNEE REVISION;  Surgeon: Arlyne Lame, MD;  Location: [facility] ORS;  Service: Orthopedics;  Laterality: Left;    Allergies  Allergen Reactions   Amoxicillin Other (See Comments)    Pencillins   Cefzil [Cefprozil] Other (See Comments)    Unknown   Cephalexin Other (See Comments)    Unknown   Propoxyphene Other (See Comments)    Unknown   Relafen [Nabumetone] Other (See Comments)    Unknown   Talwin [Pentazocine] Other (See Comments)    Unknown   Penicillins Other (See Comments)    Cannot remember reaction    Outpatient Encounter Medications as of 05/30/2023  Medication Sig   escitalopram (LEXAPRO) 10 MG tablet Take 1 tablet (10 mg total) by mouth daily.   famotidine (PEPCID)  20 MG tablet Take 20 mg by mouth at bedtime.   [DISCONTINUED] mirtazapine (REMERON) 15 MG tablet Take 15 mg by mouth at bedtime.   acetaminophen (TYLENOL) 500 MG tablet Take 1,000 mg by mouth every 8 (eight) hours as needed (PAIN).   albuterol (VENTOLIN HFA) 108 (90 Base) MCG/ACT inhaler Inhale 2 puffs into the lungs every 6 (six) hours as needed for wheezing.   aspirin EC 81 MG tablet Take 1 tablet (81 mg total) by mouth daily. Swallow whole.   Budeson-Glycopyrrol-Formoterol (BREZTRI AEROSPHERE) 160-9-4.8 MCG/ACT AERO Inhale 2 puffs into the lungs in the morning and at bedtime.   calcium carbonate (TUMS - DOSED IN MG ELEMENTAL CALCIUM) 500 MG chewable tablet Chew 1 tablet by mouth 4 (four) times daily as needed for indigestion or heartburn.   Cholecalciferol (VITAMIN D3) 50 MCG (2000 UT) capsule Take 1 capsule (2,000 Units total) by mouth daily.   latanoprost (XALATAN) 0.005 % ophthalmic solution Place 1 drop into the right eye at bedtime.   levothyroxine (SYNTHROID) 50 MCG tablet TAKE 1 TABLET BY MOUTH EVERY DAY BEFORE BREAKFAST   loratadine (CLARITIN) 10 MG tablet TAKE 1 TABLET BY MOUTH EVERY DAY AS NEEDED FOR ALLERGY (Patient taking differently: Take 10 mg by mouth daily as needed for allergies or rhinitis.)   meclizine (ANTIVERT) 25 MG tablet Take 1 tablet (25 mg total) by mouth 3 (three) times daily as needed for dizziness.   mirtazapine (REMERON) 15 MG tablet Take 1 tablet (15 mg total) by mouth at bedtime.   pantoprazole (PROTONIX) 40 MG tablet Take 1 tablet (40 mg total) by mouth 2 (two) times daily.   simvastatin (ZOCOR) 20 MG tablet TAKE 1 TABLET BY MOUTH EVERYDAY AT BEDTIME   [DISCONTINUED] pantoprazole (PROTONIX) 40 MG tablet TAKE 1 TABLET BY MOUTH EVERY DAY   [DISCONTINUED] PARoxetine (PAXIL) 40 MG tablet TAKE 1 TABLET BY MOUTH EVERY DAY IN THE MORNING   No facility-administered encounter medications on file as of 05/30/2023.    Review of Systems  Constitutional:  Negative for  activity change, appetite change, fatigue and unexpected weight change.  HENT:  Negative for congestion and hearing loss.   Eyes:  Positive for visual disturbance.  Respiratory:  Negative for cough and shortness of breath.   Cardiovascular:  Negative for chest pain, palpitations and leg swelling.  Gastrointestinal:  Negative for abdominal pain,  constipation and diarrhea.  Genitourinary:  Negative for difficulty urinating and dysuria.  Musculoskeletal:  Negative for arthralgias and myalgias.  Skin:  Negative for color change and wound.  Neurological:  Positive for light-headedness. Negative for dizziness and weakness.  Psychiatric/Behavioral:  Negative for agitation, behavioral problems and confusion.      Immunization History  Administered Date(s) Administered   Fluad Quad(high Dose 65+) 12/05/2019, 11/04/2020   Influenza Whole 12/14/2010   Influenza, High Dose Seasonal PF 12/02/2017, 12/08/2018   Influenza,inj,Quad PF,6+ Mos 11/10/2012, 11/05/2013, 11/10/2014, 10/17/2015, 10/24/2016, 10/17/2021   Influenza-Unspecified 12/04/2011, 11/10/2012, 11/05/2013, 12/07/2018   Moderna Sars-Covid-2 Vaccination 03/03/2019, 03/31/2019, 11/30/2019, 12/26/2019, 07/07/2020   Pfizer(Comirnaty)Fall Seasonal Vaccine 12 years and older 12/01/2021, 08/07/2022   Pneumococcal Conjugate-13 10/07/2009, 04/23/2013, 11/24/2013   Pneumococcal Polysaccharide-23 11/13/2009, 12/19/2018   Td 04/17/2016   Zoster Recombinant(Shingrix) 04/09/2018, 09/15/2018   Zoster, Live 05/08/2007   Pertinent  Health Maintenance Due  Topic Date Due   INFLUENZA VACCINE  09/20/2023   DEXA SCAN  Completed      03/12/2022   11:44 AM 06/14/2022    2:14 PM 09/10/2022    1:05 PM 10/08/2022    2:50 PM 05/07/2023    1:15 PM  Fall Risk  Falls in the past year? 0 0 0 0 1  Was there an injury with Fall? 0 0 0 0 1  Fall Risk Category Calculator 0 0 0 0 2  Patient at Risk for Falls Due to No Fall Risks  No Fall Risks No Fall Risks   Fall  risk Follow up Falls evaluation completed Falls evaluation completed;Falls prevention discussed Falls evaluation completed Falls evaluation completed    Functional Status Survey:    Vitals:   05/30/23 1308  BP: (!) 109/56  Pulse: 80  Resp: 20  Temp: 97.7 F (36.5 C)  SpO2: 96%  Weight: 149 lb (67.6 kg)   Body mass index is 24.05 kg/m. Physical Exam Constitutional:      General: She is not in acute distress.    Appearance: She is well-developed. She is not diaphoretic.  HENT:     Head: Normocephalic and atraumatic.     Mouth/Throat:     Pharynx: No oropharyngeal exudate.  Eyes:     Conjunctiva/sclera: Conjunctivae normal.     Pupils: Pupils are equal, round, and reactive to light.  Cardiovascular:     Rate and Rhythm: Normal rate and regular rhythm.     Heart sounds: Normal heart sounds.  Pulmonary:     Effort: Pulmonary effort is normal.     Breath sounds: Normal breath sounds.  Abdominal:     General: Bowel sounds are normal.     Palpations: Abdomen is soft.  Musculoskeletal:     Cervical back: Normal range of motion and neck supple.     Right lower leg: No edema.     Left lower leg: No edema.  Skin:    General: Skin is warm and dry.  Neurological:     Mental Status: She is alert.  Psychiatric:        Mood and Affect: Mood normal.     Labs reviewed: Recent Labs    06/11/22 1415 05/16/23 2344  NA 140 139  K 4.3 3.6  CL 101 106  CO2 32 25  GLUCOSE 90 107*  BUN 17 10  CREATININE 0.92 0.72  CALCIUM 9.3 9.3   Recent Labs    06/11/22 1415 05/16/23 2344  AST 15 14*  ALT 11 10  ALKPHOS 38*  39  BILITOT 0.6 0.6  PROT 6.0 6.2*  ALBUMIN 4.0 3.6   Recent Labs    06/11/22 1415 12/12/22 1247 05/16/23 2344  WBC 5.6 5.2 7.1  NEUTROABS 3.9 3.5 5.1  HGB 13.5 13.7 12.6  HCT 40.1 40.9 37.0  MCV 93.1 92.7 88.5  PLT 206.0 216 215   Lab Results  Component Value Date   TSH 0.916 05/16/2023   Lab Results  Component Value Date   HGBA1C 5.1 05/17/2023    Lab Results  Component Value Date   CHOL 142 05/17/2023   HDL 54 05/17/2023   LDLCALC 75 05/17/2023   TRIG 67 05/17/2023   CHOLHDL 2.6 05/17/2023    Significant Diagnostic Results in last 30 days:  MR BRAIN WO CONTRAST Result Date: 05/17/2023 CLINICAL DATA:  Neuro deficit, acute, stroke suspected EXAM: MRI HEAD WITHOUT CONTRAST TECHNIQUE: Multiplanar, multiecho pulse sequences of the brain and surrounding structures were obtained without intravenous contrast. COMPARISON:  CTA head/neck from earlier today. FINDINGS: Brain: No acute infarction, hemorrhage, hydrocephalus, extra-axial collection or mass lesion. Small remote left basal ganglia infarct. Vascular: Normal flow voids. Skull and upper cervical spine: Normal marrow signal. Sinuses/Orbits: Mostly clear sinuses.  No acute orbital findings. Other: No mastoid effusions. IMPRESSION: No evidence of acute intracranial abnormality. Electronically Signed   By: Stevenson Elbe M.D.   On: 05/17/2023 02:53   CT Angio Head Neck W WO CM Result Date: 05/17/2023 CLINICAL DATA:  Neuro deficit, acute, stroke suspected Vertigo, central EXAM: CT ANGIOGRAPHY HEAD AND NECK WITH AND WITHOUT CONTRAST TECHNIQUE: Multidetector CT imaging of the head and neck was performed using the standard protocol during bolus administration of intravenous contrast. Multiplanar CT image reconstructions and MIPs were obtained to evaluate the vascular anatomy. Carotid stenosis measurements (when applicable) are obtained utilizing NASCET criteria, using the distal internal carotid diameter as the denominator. RADIATION DOSE REDUCTION: This exam was performed according to the departmental dose-optimization program which includes automated exposure control, adjustment of the mA and/or kV according to patient size and/or use of iterative reconstruction technique. CONTRAST:  75mL OMNIPAQUE IOHEXOL 350 MG/ML SOLN COMPARISON:  CT head April 07, 2020. FINDINGS: CT HEAD FINDINGS Brain:  Small age-indeterminate lacunar infarct in left basal ganglia. No evidence of acute large vascular territory infarction, hemorrhage, hydrocephalus, extra-axial collection or mass lesion/mass effect. Vascular: See below. Skull: No acute fracture. Sinuses/Orbits: Mostly clear sinuses.  No acute orbital findings. Review of the MIP images confirms the above findings CTA NECK FINDINGS Aortic arch: Great vessel origins are patent. Right carotid system: No evidence of dissection, stenosis (50% or greater), or occlusion. Left carotid system: No evidence of dissection, stenosis (50% or greater), or occlusion. Vertebral arteries: Right dominant. No evidence of dissection, stenosis (50% or greater), or occlusion. Skeleton: No acute abnormality on limited assessment. Other neck: No acute abnormality on limited assessment. Upper chest: Visualized lung apices are clear. Review of the MIP images confirms the above findings CTA HEAD FINDINGS Anterior circulation: Bilateral intracranial ICAs, MCAs, and ACAs are patent without proximal hemodynamically significant stenosis. Posterior circulation: Bilateral intradural vertebral arteries, basilar artery and bilateral posterior cerebral arteries are patent without proximal hemodynamically significant stenosis. Fenestration of the basilar artery, anatomic variant. Venous sinuses: As permitted by contrast timing, patent. Review of the MIP images confirms the above findings IMPRESSION: 1. No emergent large vessel occlusion or proximal hemodynamically significant stenosis. 2. Small age-indeterminate lacunar infarct in the left basal ganglia. MRI could provide more sensitive evaluation for acute infarct if clinically warranted.  Electronically Signed   By: Stevenson Elbe M.D.   On: 05/17/2023 01:37   DG Chest Port 1 View Result Date: 05/17/2023 CLINICAL DATA:  gen weakness, infx wu EXAM: PORTABLE CHEST 1 VIEW COMPARISON:  Chest x-ray 12/12/2022, CT chest 05/31/2014 FINDINGS: The heart  and mediastinal contours are within normal limits. No focal consolidation. No pulmonary edema. No pleural effusion. No pneumothorax. No acute osseous abnormality. IMPRESSION: No active disease. Electronically Signed   By: Morgane  Naveau M.D.   On: 05/17/2023 00:06    Assessment/Plan 1. Benign paroxysmal positional vertigo, unspecified laterality (Primary) Stable at this time, continues lifestyle modifications.   2. Orthostatic hypotension -stable at this time, encouraged to maintain lifestyle modification and to maintain proper hydration with slow changes in positions.   3. Gastroesophageal reflux disease, unspecified whether esophagitis present Ongoing with improvement in symptoms on protonix twice daily -follow up with GI due to persistent GERD - pantoprazole (PROTONIX) 40 MG tablet; Take 1 tablet (40 mg total) by mouth 2 (two) times daily.  Dispense: 60 tablet; Refill: 0  4. Moderate episode of recurrent major depressive disorder (HCC) Improvement in mood on lexapro - escitalopram (LEXAPRO) 10 MG tablet; Take 1 tablet (10 mg total) by mouth daily.  Dispense: 30 tablet; Refill: 0  5. Insomnia, unspecified type Much better controlled on remeron - mirtazapine (REMERON) 15 MG tablet; Take 1 tablet (15 mg total) by mouth at bedtime.  Dispense: 30 tablet; Refill: 0  6. Moderate persistent asthma without complication -Well-managed with current medications. Occasional wheezing, uses rescue inhaler weekly. - Ensure rescue inhaler is available. -to make follow up appointment with pulmonologist.  7. Hypothyroidism, unspecified type Stable on Synthroid 50 mcg. - Continue Synthroid 50 mcg daily.   pt is stable for discharge-to continue pt/ot. No DME needed.  will need to follow up with PCP within 2 weeks.   Dorothy Church K. Denney Fisherman [facility] [phone]

## 2023-06-03 ENCOUNTER — Telehealth: Payer: Self-pay

## 2023-06-03 DIAGNOSIS — I951 Orthostatic hypotension: Secondary | ICD-10-CM | POA: Diagnosis not present

## 2023-06-03 DIAGNOSIS — G4733 Obstructive sleep apnea (adult) (pediatric): Secondary | ICD-10-CM | POA: Diagnosis not present

## 2023-06-03 DIAGNOSIS — R2689 Other abnormalities of gait and mobility: Secondary | ICD-10-CM | POA: Diagnosis not present

## 2023-06-03 DIAGNOSIS — G4700 Insomnia, unspecified: Secondary | ICD-10-CM | POA: Diagnosis not present

## 2023-06-03 DIAGNOSIS — R278 Other lack of coordination: Secondary | ICD-10-CM | POA: Diagnosis not present

## 2023-06-03 DIAGNOSIS — Z741 Need for assistance with personal care: Secondary | ICD-10-CM | POA: Diagnosis not present

## 2023-06-03 DIAGNOSIS — J4550 Severe persistent asthma, uncomplicated: Secondary | ICD-10-CM | POA: Diagnosis not present

## 2023-06-03 DIAGNOSIS — H8110 Benign paroxysmal vertigo, unspecified ear: Secondary | ICD-10-CM | POA: Diagnosis not present

## 2023-06-03 DIAGNOSIS — R42 Dizziness and giddiness: Secondary | ICD-10-CM | POA: Diagnosis not present

## 2023-06-03 DIAGNOSIS — M6281 Muscle weakness (generalized): Secondary | ICD-10-CM | POA: Diagnosis not present

## 2023-06-03 DIAGNOSIS — R251 Tremor, unspecified: Secondary | ICD-10-CM | POA: Diagnosis not present

## 2023-06-03 NOTE — Transitions of Care (Post Inpatient/ED Visit) (Unsigned)
   06/03/2023  Name: Dorothy Church MRN: [id number] DOB: 1935-04-22  Today's TOC FU Call Status: Today's TOC FU Call Status:: Unsuccessful Call (1st Attempt) Unsuccessful Call (1st Attempt) Date: 06/03/23  Attempted to reach the patient regarding the most recent Inpatient/ED visit.  Follow Up Plan: Additional outreach attempts will be made to reach the patient to complete the Transitions of Care (Post Inpatient/ED visit) call.   Signature Darrall Ellison, LPN [facility] Nurse Health Advisor Direct Dial [phone]

## 2023-06-03 NOTE — Transitions of Care (Post Inpatient/ED Visit) (Unsigned)
   06/03/2023  Name: Dorothy Church MRN: [id number] DOB: December 30, 1935  Today's TOC FU Call Status: Today's TOC FU Call Status:: Unsuccessful Call (2nd Attempt) Unsuccessful Call (1st Attempt) Date: 06/03/23 Unsuccessful Call (2nd Attempt) Date: 06/03/23  Attempted to reach the patient regarding the most recent Inpatient/ED visit.  Follow Up Plan: Additional outreach attempts will be made to reach the patient to complete the Transitions of Care (Post Inpatient/ED visit) call.   Signature Darrall Ellison, LPN [facility] Nurse Health Advisor Direct Dial [phone]

## 2023-06-04 NOTE — Transitions of Care (Post Inpatient/ED Visit) (Signed)
 06/04/2023  Name: Dorothy Church MRN: [id number] DOB: December 01, 1935  Today's TOC FU Call Status: Today's TOC FU Call Status:: Successful TOC FU Call Completed Unsuccessful Call (1st Attempt) Date: 06/03/23 Unsuccessful Call (2nd Attempt) Date: 06/03/23 [facility] FU Call Complete Date: 06/04/23 Patient's Name and Date of Birth confirmed.  Transition Care Management Follow-up Telephone Call Date of Discharge: 05/31/23 Discharge Facility: Other [facility]) Name of Other (Non[facility]) Discharge Facility: [place] Type of Discharge: Inpatient Admission Primary Inpatient Discharge Diagnosis:: unsteady gait How have you been since you were released from the hospital?: Better Any questions or concerns?: No  Items Reviewed: Did you receive and understand the discharge instructions provided?: Yes Medications obtained,verified, and reconciled?: Yes (Medications Reviewed) Any new allergies since your discharge?: No Dietary orders reviewed?: Yes Do you have support at home?: No  Medications Reviewed Today: Medications Reviewed Today     Reviewed by Karena Addison, LPN (Licensed Practical Nurse) on 06/04/23 at 1449  Med List Status: <None>   Medication Order Taking? Sig Documenting Provider Last Dose Status Informant  acetaminophen (TYLENOL) 500 MG tablet [id number] No Take 1,000 mg by mouth every 8 (eight) hours as needed (PAIN). [provider] Taking Active Self, Pharmacy Records  albuterol (VENTOLIN HFA) 108 (90 Base) MCG/ACT inhaler [id number] No Inhale 2 puffs into the lungs every 6 (six) hours as needed for wheezing. Salena Saner, MD Taking Active Self, Pharmacy Records  aspirin EC 81 MG tablet [id number] No Take 1 tablet (81 mg total) by mouth daily. Swallow whole. Dana Allan, MD Taking Active Self, Pharmacy Records  Budeson-Glycopyrrol-Formoterol [facility] AEROSPHERE) 160-9-4.8 MCG/ACT Sandrea Matte [id number] No Inhale 2 puffs into the lungs in the morning and at bedtime.  Salena Saner, MD Taking Active Self, Pharmacy Records  calcium carbonate (TUMS - DOSED IN MG ELEMENTAL CALCIUM) 500 MG chewable tablet [id number] No Chew 1 tablet by mouth 4 (four) times daily as needed for indigestion or heartburn. [provider] Taking Active Self, Pharmacy Records  Cholecalciferol (VITAMIN D3) 50 MCG (2000 UT) capsule [id number] No Take 1 capsule (2,000 Units total) by mouth daily. Dana Allan, MD Taking Active Self, Pharmacy Records  escitalopram (LEXAPRO) 10 MG tablet [id number] No Take 1 tablet (10 mg total) by mouth daily. Sharon Seller, NP Taking Active   famotidine (PEPCID) 20 MG tablet [id number] No Take 20 mg by mouth at bedtime. [provider] Taking Active   latanoprost (XALATAN) 0.005 % ophthalmic solution [id number] No Place 1 drop into the right eye at bedtime. [provider] Taking Active Self, Pharmacy Records  levothyroxine (SYNTHROID) 50 MCG tablet [id number] No TAKE 1 TABLET BY MOUTH EVERY DAY BEFORE BREAKFAST Dana Allan, MD Taking Active Self, Pharmacy Records  loratadine (CLARITIN) 10 MG tablet [id number] No TAKE 1 TABLET BY MOUTH EVERY DAY AS NEEDED FOR ALLERGY Erin Fulling, MD Taking Active Self, Pharmacy Records  meclizine (ANTIVERT) 25 MG tablet [id number] No Take 1 tablet (25 mg total) by mouth 3 (three) times daily as needed for dizziness. Marcelino Duster, MD Taking Active   mirtazapine (REMERON) 15 MG tablet [id number] No Take 1 tablet (15 mg total) by mouth at bedtime. Sharon Seller, NP Taking Active   pantoprazole (PROTONIX) 40 MG tablet [id number] No Take 1 tablet (40 mg total) by mouth 2 (two) times daily. Sharon Seller, NP Taking Active   simvastatin (ZOCOR) 20 MG tablet [id number] No TAKE 1 TABLET BY MOUTH EVERYDAY AT BEDTIME Dana Allan, MD Taking Active Self, Pharmacy Records  Home Care and Equipment/Supplies: Were Home Health Services Ordered?: Yes Name of Home Health Agency:: [facility] Has Agency set up a time to come to your home?: Yes First Home Health Visit Date: 06/03/23 Any new equipment or medical supplies ordered?: NA  Functional Questionnaire: Do you need assistance with bathing/showering or dressing?: No Do you need assistance with meal preparation?: No Do you need assistance with eating?: No Do you have difficulty maintaining continence: No Do you need assistance with getting out of bed/getting out of a chair/moving?: No Do you have difficulty managing or taking your medications?: No  Follow up appointments reviewed: PCP Follow-up appointment confirmed?: No (transferred to PCP at [facility]) MD Provider Line Number:[phone] Given: No Specialist Hospital Follow-up appointment confirmed?: NA Do you need transportation to your follow-up appointment?: No Do you understand care options if your condition(s) worsen?: Yes-patient verbalized understanding    SIGNATURE Darrall Ellison, LPN [facility] Nurse Health Advisor Direct Dial [phone]

## 2023-06-05 DIAGNOSIS — H401110 Primary open-angle glaucoma, right eye, stage unspecified: Secondary | ICD-10-CM | POA: Diagnosis not present

## 2023-06-05 DIAGNOSIS — H43811 Vitreous degeneration, right eye: Secondary | ICD-10-CM | POA: Diagnosis not present

## 2023-06-05 DIAGNOSIS — H26491 Other secondary cataract, right eye: Secondary | ICD-10-CM | POA: Diagnosis not present

## 2023-06-06 ENCOUNTER — Ambulatory Visit: Payer: Medicare Other | Admitting: Pulmonary Disease

## 2023-06-07 DIAGNOSIS — R2689 Other abnormalities of gait and mobility: Secondary | ICD-10-CM | POA: Diagnosis not present

## 2023-06-07 DIAGNOSIS — I951 Orthostatic hypotension: Secondary | ICD-10-CM | POA: Diagnosis not present

## 2023-06-07 DIAGNOSIS — R42 Dizziness and giddiness: Secondary | ICD-10-CM | POA: Diagnosis not present

## 2023-06-07 DIAGNOSIS — R251 Tremor, unspecified: Secondary | ICD-10-CM | POA: Diagnosis not present

## 2023-06-07 DIAGNOSIS — H8110 Benign paroxysmal vertigo, unspecified ear: Secondary | ICD-10-CM | POA: Diagnosis not present

## 2023-06-07 DIAGNOSIS — Z741 Need for assistance with personal care: Secondary | ICD-10-CM | POA: Diagnosis not present

## 2023-06-07 DIAGNOSIS — M6281 Muscle weakness (generalized): Secondary | ICD-10-CM | POA: Diagnosis not present

## 2023-06-07 DIAGNOSIS — G4733 Obstructive sleep apnea (adult) (pediatric): Secondary | ICD-10-CM | POA: Diagnosis not present

## 2023-06-07 DIAGNOSIS — R278 Other lack of coordination: Secondary | ICD-10-CM | POA: Diagnosis not present

## 2023-06-07 DIAGNOSIS — G4700 Insomnia, unspecified: Secondary | ICD-10-CM | POA: Diagnosis not present

## 2023-06-07 DIAGNOSIS — J4550 Severe persistent asthma, uncomplicated: Secondary | ICD-10-CM | POA: Diagnosis not present

## 2023-06-10 DIAGNOSIS — R251 Tremor, unspecified: Secondary | ICD-10-CM | POA: Diagnosis not present

## 2023-06-10 DIAGNOSIS — G4700 Insomnia, unspecified: Secondary | ICD-10-CM | POA: Diagnosis not present

## 2023-06-10 DIAGNOSIS — G4733 Obstructive sleep apnea (adult) (pediatric): Secondary | ICD-10-CM | POA: Diagnosis not present

## 2023-06-10 DIAGNOSIS — Z741 Need for assistance with personal care: Secondary | ICD-10-CM | POA: Diagnosis not present

## 2023-06-10 DIAGNOSIS — I951 Orthostatic hypotension: Secondary | ICD-10-CM | POA: Diagnosis not present

## 2023-06-10 DIAGNOSIS — M6281 Muscle weakness (generalized): Secondary | ICD-10-CM | POA: Diagnosis not present

## 2023-06-10 DIAGNOSIS — R278 Other lack of coordination: Secondary | ICD-10-CM | POA: Diagnosis not present

## 2023-06-10 DIAGNOSIS — J4550 Severe persistent asthma, uncomplicated: Secondary | ICD-10-CM | POA: Diagnosis not present

## 2023-06-10 DIAGNOSIS — R2689 Other abnormalities of gait and mobility: Secondary | ICD-10-CM | POA: Diagnosis not present

## 2023-06-10 DIAGNOSIS — R42 Dizziness and giddiness: Secondary | ICD-10-CM | POA: Diagnosis not present

## 2023-06-10 DIAGNOSIS — H8110 Benign paroxysmal vertigo, unspecified ear: Secondary | ICD-10-CM | POA: Diagnosis not present

## 2023-06-11 ENCOUNTER — Encounter: Admitting: Nurse Practitioner

## 2023-06-12 DIAGNOSIS — I951 Orthostatic hypotension: Secondary | ICD-10-CM | POA: Diagnosis not present

## 2023-06-12 DIAGNOSIS — H8110 Benign paroxysmal vertigo, unspecified ear: Secondary | ICD-10-CM | POA: Diagnosis not present

## 2023-06-12 DIAGNOSIS — R278 Other lack of coordination: Secondary | ICD-10-CM | POA: Diagnosis not present

## 2023-06-12 DIAGNOSIS — G4700 Insomnia, unspecified: Secondary | ICD-10-CM | POA: Diagnosis not present

## 2023-06-12 DIAGNOSIS — R42 Dizziness and giddiness: Secondary | ICD-10-CM | POA: Diagnosis not present

## 2023-06-12 DIAGNOSIS — R251 Tremor, unspecified: Secondary | ICD-10-CM | POA: Diagnosis not present

## 2023-06-12 DIAGNOSIS — Z741 Need for assistance with personal care: Secondary | ICD-10-CM | POA: Diagnosis not present

## 2023-06-12 DIAGNOSIS — G4733 Obstructive sleep apnea (adult) (pediatric): Secondary | ICD-10-CM | POA: Diagnosis not present

## 2023-06-12 DIAGNOSIS — M6281 Muscle weakness (generalized): Secondary | ICD-10-CM | POA: Diagnosis not present

## 2023-06-12 DIAGNOSIS — J4550 Severe persistent asthma, uncomplicated: Secondary | ICD-10-CM | POA: Diagnosis not present

## 2023-06-12 DIAGNOSIS — R2689 Other abnormalities of gait and mobility: Secondary | ICD-10-CM | POA: Diagnosis not present

## 2023-06-13 ENCOUNTER — Encounter: Admitting: Nurse Practitioner

## 2023-06-13 DIAGNOSIS — J4550 Severe persistent asthma, uncomplicated: Secondary | ICD-10-CM | POA: Diagnosis not present

## 2023-06-13 DIAGNOSIS — I951 Orthostatic hypotension: Secondary | ICD-10-CM | POA: Diagnosis not present

## 2023-06-13 DIAGNOSIS — R2689 Other abnormalities of gait and mobility: Secondary | ICD-10-CM | POA: Diagnosis not present

## 2023-06-13 DIAGNOSIS — Z741 Need for assistance with personal care: Secondary | ICD-10-CM | POA: Diagnosis not present

## 2023-06-13 DIAGNOSIS — R251 Tremor, unspecified: Secondary | ICD-10-CM | POA: Diagnosis not present

## 2023-06-13 DIAGNOSIS — G4700 Insomnia, unspecified: Secondary | ICD-10-CM | POA: Diagnosis not present

## 2023-06-13 DIAGNOSIS — R278 Other lack of coordination: Secondary | ICD-10-CM | POA: Diagnosis not present

## 2023-06-13 DIAGNOSIS — G4733 Obstructive sleep apnea (adult) (pediatric): Secondary | ICD-10-CM | POA: Diagnosis not present

## 2023-06-13 DIAGNOSIS — R42 Dizziness and giddiness: Secondary | ICD-10-CM | POA: Diagnosis not present

## 2023-06-13 DIAGNOSIS — H8110 Benign paroxysmal vertigo, unspecified ear: Secondary | ICD-10-CM | POA: Diagnosis not present

## 2023-06-13 DIAGNOSIS — M6281 Muscle weakness (generalized): Secondary | ICD-10-CM | POA: Diagnosis not present

## 2023-06-17 DIAGNOSIS — R251 Tremor, unspecified: Secondary | ICD-10-CM | POA: Diagnosis not present

## 2023-06-17 DIAGNOSIS — J4550 Severe persistent asthma, uncomplicated: Secondary | ICD-10-CM | POA: Diagnosis not present

## 2023-06-17 DIAGNOSIS — R278 Other lack of coordination: Secondary | ICD-10-CM | POA: Diagnosis not present

## 2023-06-17 DIAGNOSIS — Z741 Need for assistance with personal care: Secondary | ICD-10-CM | POA: Diagnosis not present

## 2023-06-17 DIAGNOSIS — H8110 Benign paroxysmal vertigo, unspecified ear: Secondary | ICD-10-CM | POA: Diagnosis not present

## 2023-06-17 DIAGNOSIS — R42 Dizziness and giddiness: Secondary | ICD-10-CM | POA: Diagnosis not present

## 2023-06-17 DIAGNOSIS — M6281 Muscle weakness (generalized): Secondary | ICD-10-CM | POA: Diagnosis not present

## 2023-06-17 DIAGNOSIS — R2689 Other abnormalities of gait and mobility: Secondary | ICD-10-CM | POA: Diagnosis not present

## 2023-06-17 DIAGNOSIS — G4700 Insomnia, unspecified: Secondary | ICD-10-CM | POA: Diagnosis not present

## 2023-06-17 DIAGNOSIS — I951 Orthostatic hypotension: Secondary | ICD-10-CM | POA: Diagnosis not present

## 2023-06-17 DIAGNOSIS — G4733 Obstructive sleep apnea (adult) (pediatric): Secondary | ICD-10-CM | POA: Diagnosis not present

## 2023-06-19 DIAGNOSIS — M6281 Muscle weakness (generalized): Secondary | ICD-10-CM | POA: Diagnosis not present

## 2023-06-19 DIAGNOSIS — H8110 Benign paroxysmal vertigo, unspecified ear: Secondary | ICD-10-CM | POA: Diagnosis not present

## 2023-06-19 DIAGNOSIS — R2689 Other abnormalities of gait and mobility: Secondary | ICD-10-CM | POA: Diagnosis not present

## 2023-06-19 DIAGNOSIS — Z741 Need for assistance with personal care: Secondary | ICD-10-CM | POA: Diagnosis not present

## 2023-06-19 DIAGNOSIS — R278 Other lack of coordination: Secondary | ICD-10-CM | POA: Diagnosis not present

## 2023-06-19 DIAGNOSIS — R42 Dizziness and giddiness: Secondary | ICD-10-CM | POA: Diagnosis not present

## 2023-06-19 DIAGNOSIS — G4700 Insomnia, unspecified: Secondary | ICD-10-CM | POA: Diagnosis not present

## 2023-06-19 DIAGNOSIS — G4733 Obstructive sleep apnea (adult) (pediatric): Secondary | ICD-10-CM | POA: Diagnosis not present

## 2023-06-19 DIAGNOSIS — J4550 Severe persistent asthma, uncomplicated: Secondary | ICD-10-CM | POA: Diagnosis not present

## 2023-06-19 DIAGNOSIS — R251 Tremor, unspecified: Secondary | ICD-10-CM | POA: Diagnosis not present

## 2023-06-19 DIAGNOSIS — I951 Orthostatic hypotension: Secondary | ICD-10-CM | POA: Diagnosis not present

## 2023-06-20 ENCOUNTER — Ambulatory Visit: Admitting: Nurse Practitioner

## 2023-06-20 ENCOUNTER — Encounter: Payer: Self-pay | Admitting: Nurse Practitioner

## 2023-06-20 VITALS — BP 128/76 | HR 85 | Temp 97.8°F | Ht 66.0 in | Wt 149.0 lb

## 2023-06-20 DIAGNOSIS — K219 Gastro-esophageal reflux disease without esophagitis: Secondary | ICD-10-CM | POA: Diagnosis not present

## 2023-06-20 DIAGNOSIS — J4540 Moderate persistent asthma, uncomplicated: Secondary | ICD-10-CM

## 2023-06-20 DIAGNOSIS — M810 Age-related osteoporosis without current pathological fracture: Secondary | ICD-10-CM

## 2023-06-20 DIAGNOSIS — E559 Vitamin D deficiency, unspecified: Secondary | ICD-10-CM

## 2023-06-20 DIAGNOSIS — E039 Hypothyroidism, unspecified: Secondary | ICD-10-CM | POA: Diagnosis not present

## 2023-06-20 DIAGNOSIS — G4700 Insomnia, unspecified: Secondary | ICD-10-CM

## 2023-06-20 DIAGNOSIS — K5904 Chronic idiopathic constipation: Secondary | ICD-10-CM

## 2023-06-20 DIAGNOSIS — F331 Major depressive disorder, recurrent, moderate: Secondary | ICD-10-CM

## 2023-06-20 DIAGNOSIS — M150 Primary generalized (osteo)arthritis: Secondary | ICD-10-CM | POA: Diagnosis not present

## 2023-06-20 DIAGNOSIS — E785 Hyperlipidemia, unspecified: Secondary | ICD-10-CM

## 2023-06-20 DIAGNOSIS — H8110 Benign paroxysmal vertigo, unspecified ear: Secondary | ICD-10-CM | POA: Diagnosis not present

## 2023-06-20 MED ORDER — ESCITALOPRAM OXALATE 10 MG PO TABS: 10.0000 mg | ORAL_TABLET | Freq: Every day | ORAL | 1 refills | Status: DC

## 2023-06-20 MED ORDER — MIRTAZAPINE 15 MG PO TABS: 15.0000 mg | ORAL_TABLET | Freq: Every day | ORAL | 1 refills | Status: DC

## 2023-06-20 NOTE — Assessment & Plan Note (Signed)
 Controlled on current regimen, continues to follow up with pulmonary routinely

## 2023-06-20 NOTE — Assessment & Plan Note (Signed)
 Ongoing, improved with protonix  40 mg BID, followed by GI

## 2023-06-20 NOTE — Assessment & Plan Note (Signed)
 Stable at this time, not needing meclizine  recently

## 2023-06-20 NOTE — Assessment & Plan Note (Signed)
 Controlled on miralax and dietary modifications

## 2023-06-20 NOTE — Progress Notes (Signed)
 Careteam: Patient Care Team: Verma Gobble, NP as PCP - General (Geriatric Medicine) Steven Elam, FNP (Nurse Practitioner) Marc Senior, MD as Consulting Physician (Pulmonary Disease) PLACE OF SERVICE:  [facility]   Advanced Directive information    Allergies  Allergen Reactions   Amoxicillin Other (See Comments)    Pencillins   Cefzil [Cefprozil] Other (See Comments)    Unknown   Cephalexin Other (See Comments)    Unknown   Propoxyphene Other (See Comments)    Unknown   Relafen [Nabumetone] Other (See Comments)    Unknown   Talwin [Pentazocine] Other (See Comments)    Unknown   Penicillins Other (See Comments)    Cannot remember reaction    Chief Complaint  Patient presents with   Establish Care    NP to Establish Care.     Discussed the use of AI scribe software for clinical note transcription with the patient, who gave verbal consent to proceed.  History of Present Illness   Dorothy Church is an 88 year old female who presents for an established care appointment.  She has a history of dizziness and unsteadiness, for which she was recently in rehabilitation. She is currently undergoing occupational therapy and physical therapy to improve her balance and strengthen her legs. She has had two knee joint replacements in her left knee and favors her left side, leading to weakness in her right leg. Her dizziness has improved but is not completely resolved.  She has a history of asthma and uses albuterol  as needed, particularly during pollen season. Her asthma is generally well-controlled, with occasional periods of difficulty breathing. She uses a Breztri  inhaler twice daily and albuterol  as needed.  She experiences acid reflux and indigestion, for which she takes Protonix  twice daily and occasionally uses Tums. She finds it challenging to take Pepcid  as prescribed due to her grazing eating habits. Her indigestion is reportedly well-controlled  with the current medication regimen.  She has a history of osteoporosis and receives Reclast  infusions. She also takes vitamin D  supplements due to a deficiency and osteoporosis. She is unsure of her last bone density test but believes it is due soon. Followed by endocrinology for osteoporosis.   She takes levothyroxine  50 mcg daily for thyroid  management, and her thyroid  levels were last checked in March and were stable.  She is on escitalopram  10 mg daily for depression and reports an improved mood. She also takes mirtazapine  15 mg at bedtime to aid with sleep and mood.  She has a history of high cholesterol and takes simvastatin , with her cholesterol levels last checked in March and reported as stable.  She reports a history of arthritis, particularly in her neck, which was previously x-rayed and showed significant arthritis. She experienced arm pain after a physical therapy session, which resolved after modifying her exercises.  She has a history of Barrett's esophagus and follows up with a gastroenterologist. She had an endoscopy in 2022.  She also reports a history of sleep apnea but no longer requires CPAP after a sleep study indicated it was unnecessary.  She mentions a family history of breast cancer in her mother, who passed away at 61, and a twin sister who recently passed away at 64 due to dementia . Her father died at 82 due to alcohol -related issues.  She reports occasional constipation, which is managed with Miralax every two to three days.       Review of Systems:  Review of Systems  Constitutional:  Negative for chills, fever and weight loss.  HENT:  Negative for tinnitus.   Respiratory:  Negative for cough, sputum production and shortness of breath.   Cardiovascular:  Negative for chest pain, palpitations and leg swelling.  Gastrointestinal:  Negative for abdominal pain, constipation, diarrhea and heartburn.  Genitourinary:  Negative for dysuria, frequency and urgency.   Musculoskeletal:  Negative for back pain, falls, joint pain and myalgias.  Skin: Negative.   Neurological:  Negative for dizziness and headaches.  Psychiatric/Behavioral:  Negative for depression and memory loss. The patient does not have insomnia.     Past Medical History:  Diagnosis Date   Abdominal pain    Abnormal CT of liver    Accident due to mechanical fall without injury 01/28/2022   Acute pain of left shoulder 09/28/2020   Anxiety    Anxiety 04/28/2020   Aortic stenosis    Aortic stenosis 03/04/2012   Overview:   Not confirmed by Dr Gollan.    Aortic valve disorders    Arthritis    Asthma    Asthma 08/07/2012   Barrett's esophagus    [facility] last EGD 02/2018 Dr. Peg Bouton    Barrett's esophagus 03/11/2017   Overview:  Dr Peg Bouton   Carotid artery stenosis 12/30/2018   1-39% b/l carotids       Colon polyps    Colon polyps    COPD (chronic obstructive pulmonary disease) (HCC)    DDD (degenerative disc disease), cervical    DDD (degenerative disc disease), cervical 04/28/2020   DDD (degenerative disc disease), lumbar 01/22/2017   Depressive disorder    Diverticulosis    Erosive esophagitis    Erosive esophagitis 04/28/2020   Fundic gland polyps of stomach, benign    Gastric polyp    Gastric polyps    Gastritis    Generalized osteoarthritis of hand 05/03/2015   GERD (gastroesophageal reflux disease)    GOA (generalized osteoarthritis) 11/23/2015   Goiter    Hiatal hernia    small    Hiatal hernia    History of colonic polyps 12/08/2013   Hypercholesteremia    Hypothyroidism    Impaired fasting glucose    Impaired fasting glucose 03/04/2012   Overview:   Glucose 111 02/2012   Intrinsic asthma, unspecified    Joint pain of ankle and foot, right 05/17/2015   Left rib fracture    Mechanical complication of knee prosthesis, initial encounter (HCC) 08/25/2017   Obstructive sleep apnea (adult) (pediatric)    used cpap x 10 years per Dr. Auston Left off as of 12/30/18     OSA (obstructive sleep apnea)    used cpap x 10 years per Dr. 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   Stress 04/28/2020   Syncope and collapse    TIA (transient ischemic attack)    TIA (transient ischemic attack) 12/11/2016   TIA (transient ischemic attack) 12/11/2016   UTI (urinary tract infection)    Vitamin D  deficiency    Past Surgical History:  Procedure Laterality Date   BREAST EXCISIONAL BIOPSY Left    BREAST SURGERY     CATARACT EXTRACTION W/PHACO Right 11/07/2021   Procedure: CATARACT  EXTRACTION PHACO AND INTRAOCULAR LENS PLACEMENT (IOC) RIGHT  8.87 00:50.8;  Surgeon: Clair Crews, MD;  Location: [facility];  Service: Ophthalmology;  Laterality: Right;   COLONOSCOPY     COLONOSCOPY WITH ESOPHAGOGASTRODUODENOSCOPY (EGD)     COLONOSCOPY WITH PROPOFOL  N/A 12/14/2014   Procedure: COLONOSCOPY WITH PROPOFOL ;  Surgeon: Deveron Fly, MD;  Location: [facility] END[facility]COPY;  Service: Endoscopy;  Laterality: N/A;   COLONOSCOPY WITH PROPOFOL  N/A 03/18/2018   Procedure: COLONOSCOPY WITH PROPOFOL ;  Surgeon: Deveron Fly, MD;  Location: [facility] END[facility]COPY;  Service: Endoscopy;  Laterality: N/A;   ENUCLEATION     ESOPHAGOGASTRODUODENOSCOPY N/A 08/30/2020   Procedure: ESOPHAGOGASTRODUODENOSCOPY (EGD);  Surgeon: Shane Darling, MD;  Location: [facility] ENDOSCOPY;  Service: Endoscopy;  Laterality: N/A;   ESOPHAGOGASTRODUODENOSCOPY (EGD) WITH PROPOFOL  N/A 12/14/2014   Procedure: ESOPHAGOGASTRODUODENOSCOPY (EGD) WITH PROPOFOL ;  Surgeon: Deveron Fly, MD;  Location: [facility] ENDOSCOPY;  Service: Endoscopy;  Laterality: N/A;   ESOPHAGOGASTRODUODENOSCOPY (EGD) WITH PROPOFOL  N/A 10/18/2016    Procedure: ESOPHAGOGASTRODUODENOSCOPY (EGD) WITH PROPOFOL ;  Surgeon: Deveron Fly, MD;  Location: [facility] ENDOSCOPY;  Service: Endoscopy;  Laterality: N/A;   ESOPHAGOGASTRODUODENOSCOPY (EGD) WITH PROPOFOL  N/A 03/18/2018   Procedure: ESOPHAGOGASTRODUODENOSCOPY (EGD) WITH PROPOFOL ;  Surgeon: Deveron Fly, MD;  Location: [facility] ENDOSCOPY;  Service: Endoscopy;  Laterality: N/A;   ESOPHAGOGASTRODUODENOSCOPY (EGD) WITH PROPOFOL  N/A 12/19/2020   Procedure: ESOPHAGOGASTRODUODENOSCOPY (EGD) WITH PROPOFOL ;  Surgeon: Shane Darling, MD;  Location: [facility] ENDOSCOPY;  Service: Endoscopy;  Laterality: N/A;   EYE SURGERY     FRACTURE SURGERY     HIP SURGERY Left    pin and plate   IR ANGIO INTRA EXTRACRAN SEL COM CAROTID INNOMINATE BILAT MOD SED  10/17/2018   IR ANGIO VERTEBRAL SEL VERTEBRAL BILAT MOD SED  10/17/2018   IR ANGIOGRAM EXTREMITY LEFT  10/17/2018   JOINT REPLACEMENT     Left Total Knee Arthroplasty     Dr Aubry Blase and also had 39 years ago from 2020    nasal polyps     removed x 2 affects sense of smell x 10-15 years from 04/28/20   prosthesis eye implant     REPLACEMENT UNICONDYLAR JOINT KNEE Left    TONSILLECTOMY     TOTAL KNEE REVISION Left 09/13/2017   Procedure: TOTAL KNEE REVISION;  Surgeon: Arlyne Lame, MD;  Location: [facility] ORS;  Service: Orthopedics;  Laterality: Left;   Social History:   reports that she has never smoked. She has never used smokeless tobacco. She reports current alcohol  use of about 1.0 standard drink of alcohol  per week. She reports that she does not use drugs.  Family History  Problem Relation Age of Onset   Breast cancer Mother        pagets dx 6s   Heart disease Father    Alcohol  abuse Father    Dementia Sister        twin sister 93 dx'ed dementia   Bipolar disorder Brother     Medications: Patient's Medications  New Prescriptions   No medications on file  Previous Medications   ACETAMINOPHEN  (TYLENOL ) 500 MG TABLET    Take 1,000 mg by  mouth every 8 (eight) hours as needed (PAIN).   ALBUTEROL  (VENTOLIN  HFA) 108 (90 BASE) MCG/ACT INHALER    Inhale 2 puffs into the lungs every 6 (six) hours as needed for wheezing.   ASPIRIN  EC 81 MG TABLET    Take 1 tablet (81 mg total) by mouth daily. Swallow whole.   BUDESON-GLYCOPYRROL-FORMOTEROL  (BREZTRI  AEROSPHERE)  160-9-4.8 MCG/ACT AERO    Inhale 2 puffs into the lungs in the morning and at bedtime.   CALCIUM  CARBONATE (TUMS - DOSED IN MG ELEMENTAL CALCIUM ) 500 MG CHEWABLE TABLET    Chew 1 tablet by mouth 4 (four) times daily as needed for indigestion or heartburn.   CHOLECALCIFEROL (VITAMIN D3) 50 MCG (2000 UT) CAPSULE    Take 1 capsule (2,000 Units total) by mouth daily.   LATANOPROST (XALATAN) 0.005 % OPHTHALMIC SOLUTION    Place 1 drop into the right eye at bedtime.   LEVOTHYROXINE  (SYNTHROID ) 50 MCG TABLET    TAKE 1 TABLET BY MOUTH EVERY DAY BEFORE BREAKFAST   LORATADINE  (CLARITIN ) 10 MG TABLET    TAKE 1 TABLET BY MOUTH EVERY DAY AS NEEDED FOR ALLERGY    MECLIZINE  (ANTIVERT ) 25 MG TABLET    Take 1 tablet (25 mg total) by mouth 3 (three) times daily as needed for dizziness.   PANTOPRAZOLE  (PROTONIX ) 40 MG TABLET    Take 1 tablet (40 mg total) by mouth 2 (two) times daily.   SIMVASTATIN  (ZOCOR ) 20 MG TABLET    TAKE 1 TABLET BY MOUTH EVERYDAY AT BEDTIME  Modified Medications   Modified Medication Previous Medication   ESCITALOPRAM  (LEXAPRO ) 10 MG TABLET escitalopram  (LEXAPRO ) 10 MG tablet      Take 1 tablet (10 mg total) by mouth daily.    Take 1 tablet (10 mg total) by mouth daily.   MIRTAZAPINE  (REMERON ) 15 MG TABLET mirtazapine  (REMERON ) 15 MG tablet      Take 1 tablet (15 mg total) by mouth at bedtime.    Take 1 tablet (15 mg total) by mouth at bedtime.  Discontinued Medications   FAMOTIDINE  (PEPCID ) 20 MG TABLET    Take 20 mg by mouth at bedtime.    Physical Exam:  Vitals:   06/20/23 0911  BP: 128/76  Pulse: 85  Temp: 97.8 F (36.6 C)  SpO2: 96%  Weight: 149 lb (67.6 kg)   Height: 5\' 6"  (1.676 m)   Body mass index is 24.05 kg/m. Wt Readings from Last 3 Encounters:  06/20/23 149 lb (67.6 kg)  05/30/23 149 lb (67.6 kg)  05/20/23 143 lb 11.2 oz (65.2 kg)    Physical Exam Constitutional:      General: She is not in acute distress.    Appearance: She is well-developed. She is not diaphoretic.  HENT:     Head: Normocephalic and atraumatic.     Mouth/Throat:     Pharynx: No oropharyngeal exudate.  Eyes:     Conjunctiva/sclera: Conjunctivae normal.     Pupils: Pupils are equal, round, and reactive to light.  Cardiovascular:     Rate and Rhythm: Normal rate and regular rhythm.     Heart sounds: Normal heart sounds.  Pulmonary:     Effort: Pulmonary effort is normal.     Breath sounds: Normal breath sounds.  Abdominal:     General: Bowel sounds are normal.     Palpations: Abdomen is soft.  Musculoskeletal:     Cervical back: Normal range of motion and neck supple.     Right lower leg: No edema.     Left lower leg: No edema.  Skin:    General: Skin is warm and dry.  Neurological:     Mental Status: She is alert.  Psychiatric:        Mood and Affect: Mood normal.     Labs reviewed: Basic Metabolic Panel: Recent Labs    05/16/23 2344  NA 139  K 3.6  CL 106  CO2 25  GLUCOSE 107*  BUN 10  CREATININE 0.72  CALCIUM  9.3  TSH 0.916   Liver Function Tests: Recent Labs    05/16/23 2344  AST 14*  ALT 10  ALKPHOS 39  BILITOT 0.6  PROT 6.2*  ALBUMIN  3.6   No results for input(s): "LIPASE", "AMYLASE" in the last 8760 hours. No results for input(s): "AMMONIA" in the last 8760 hours. CBC: Recent Labs    12/12/22 1247 05/16/23 2344  WBC 5.2 7.1  NEUTROABS 3.5 5.1  HGB 13.7 12.6  HCT 40.9 37.0  MCV 92.7 88.5  PLT 216 215   Lipid Panel: Recent Labs    05/17/23 0428  CHOL 142  HDL 54  LDLCALC 75  TRIG 67  CHOLHDL 2.6   TSH: Recent Labs    05/16/23 2344  TSH 0.916   A1C: Lab Results  Component Value Date   HGBA1C  5.1 05/17/2023     Assessment/Plan Tilly was seen today for establish care.  Benign paroxysmal positional vertigo, unspecified laterality Assessment & Plan: Stable at this time, not needing meclizine  recently    Moderate persistent asthma without complication Assessment & Plan: Controlled on current regimen, continues to follow up with pulmonary routinely    Gastroesophageal reflux disease, unspecified whether esophagitis present Assessment & Plan: Ongoing, improved with protonix  40 mg BID, followed by GI   Hypothyroidism, unspecified type Assessment & Plan: TSH stable on synthroid  50 mcg daily    Osteoporosis, unspecified osteoporosis type, unspecified pathological fracture presence Assessment & Plan: Followed by endocrinology, continues on reclast  yearly Recommended to take calcium  600 mg twice daily with Vitamin D  2000 units daily and weight bearing activity 30 mins/5 days a week     Vitamin D  deficiency Assessment & Plan: Continues on vit d daily    Moderate episode of recurrent major depressive disorder (HCC) Assessment & Plan: Stable with remeron  and lexapro . She has also stopped watching the news which increase anxiety and depression.   Orders: -     Escitalopram  Oxalate; Take 1 tablet (10 mg total) by mouth daily.  Dispense: 90 tablet; Refill: 1  Insomnia, unspecified type Assessment & Plan: Well controlled on remeron  15 mg at bedtime.   Orders: -     Mirtazapine ; Take 1 tablet (15 mg total) by mouth at bedtime.  Dispense: 90 tablet; Refill: 1  Hyperlipidemia, unspecified hyperlipidemia type Assessment & Plan: LDL stable on zocor , monitor yearly and continue dietary modifications   Primary osteoarthritis involving multiple joints Assessment & Plan: Stable, continues with PT and tylenol  PRN   Chronic idiopathic constipation Assessment & Plan: Controlled on miralax and dietary modifications     Next appt: next week for AWV 3 month follow  up  Jaquon Gingerich K. Denney Fisherman  [facility] [phone]

## 2023-06-20 NOTE — Assessment & Plan Note (Signed)
 Continues on vit d daily

## 2023-06-20 NOTE — Assessment & Plan Note (Signed)
 Stable, continues with PT and tylenol  PRN

## 2023-06-20 NOTE — Assessment & Plan Note (Signed)
 Stable with remeron  and lexapro . She has also stopped watching the news which increase anxiety and depression.

## 2023-06-20 NOTE — Assessment & Plan Note (Signed)
 LDL stable on zocor , monitor yearly and continue dietary modifications

## 2023-06-20 NOTE — Assessment & Plan Note (Signed)
 TSH stable on synthroid  50 mcg daily

## 2023-06-20 NOTE — Assessment & Plan Note (Signed)
 Followed by endocrinology, continues on reclast  yearly Recommended to take calcium  600 mg twice daily with Vitamin D  2000 units daily and weight bearing activity 30 mins/5 days a week

## 2023-06-20 NOTE — Assessment & Plan Note (Signed)
 Well controlled on remeron  15 mg at bedtime.

## 2023-06-24 ENCOUNTER — Other Ambulatory Visit: Payer: Self-pay | Admitting: Nurse Practitioner

## 2023-06-24 DIAGNOSIS — R42 Dizziness and giddiness: Secondary | ICD-10-CM | POA: Diagnosis not present

## 2023-06-24 DIAGNOSIS — Z741 Need for assistance with personal care: Secondary | ICD-10-CM | POA: Diagnosis not present

## 2023-06-24 DIAGNOSIS — J4550 Severe persistent asthma, uncomplicated: Secondary | ICD-10-CM | POA: Diagnosis not present

## 2023-06-24 DIAGNOSIS — M6281 Muscle weakness (generalized): Secondary | ICD-10-CM | POA: Diagnosis not present

## 2023-06-24 DIAGNOSIS — R251 Tremor, unspecified: Secondary | ICD-10-CM | POA: Diagnosis not present

## 2023-06-24 DIAGNOSIS — H8110 Benign paroxysmal vertigo, unspecified ear: Secondary | ICD-10-CM | POA: Diagnosis not present

## 2023-06-24 DIAGNOSIS — G4700 Insomnia, unspecified: Secondary | ICD-10-CM | POA: Diagnosis not present

## 2023-06-24 DIAGNOSIS — R2689 Other abnormalities of gait and mobility: Secondary | ICD-10-CM | POA: Diagnosis not present

## 2023-06-24 DIAGNOSIS — K219 Gastro-esophageal reflux disease without esophagitis: Secondary | ICD-10-CM

## 2023-06-24 DIAGNOSIS — I951 Orthostatic hypotension: Secondary | ICD-10-CM | POA: Diagnosis not present

## 2023-06-24 DIAGNOSIS — R278 Other lack of coordination: Secondary | ICD-10-CM | POA: Diagnosis not present

## 2023-06-24 DIAGNOSIS — G4733 Obstructive sleep apnea (adult) (pediatric): Secondary | ICD-10-CM | POA: Diagnosis not present

## 2023-06-26 DIAGNOSIS — M6281 Muscle weakness (generalized): Secondary | ICD-10-CM | POA: Diagnosis not present

## 2023-06-26 DIAGNOSIS — R2689 Other abnormalities of gait and mobility: Secondary | ICD-10-CM | POA: Diagnosis not present

## 2023-06-26 DIAGNOSIS — J4550 Severe persistent asthma, uncomplicated: Secondary | ICD-10-CM | POA: Diagnosis not present

## 2023-06-26 DIAGNOSIS — I951 Orthostatic hypotension: Secondary | ICD-10-CM | POA: Diagnosis not present

## 2023-06-26 DIAGNOSIS — G4733 Obstructive sleep apnea (adult) (pediatric): Secondary | ICD-10-CM | POA: Diagnosis not present

## 2023-06-26 DIAGNOSIS — R251 Tremor, unspecified: Secondary | ICD-10-CM | POA: Diagnosis not present

## 2023-06-26 DIAGNOSIS — R278 Other lack of coordination: Secondary | ICD-10-CM | POA: Diagnosis not present

## 2023-06-26 DIAGNOSIS — R42 Dizziness and giddiness: Secondary | ICD-10-CM | POA: Diagnosis not present

## 2023-06-26 DIAGNOSIS — Z741 Need for assistance with personal care: Secondary | ICD-10-CM | POA: Diagnosis not present

## 2023-06-26 DIAGNOSIS — G4700 Insomnia, unspecified: Secondary | ICD-10-CM | POA: Diagnosis not present

## 2023-06-26 DIAGNOSIS — H8110 Benign paroxysmal vertigo, unspecified ear: Secondary | ICD-10-CM | POA: Diagnosis not present

## 2023-06-27 ENCOUNTER — Ambulatory Visit: Admitting: Nurse Practitioner

## 2023-06-27 ENCOUNTER — Encounter: Payer: Self-pay | Admitting: Nurse Practitioner

## 2023-06-27 VITALS — BP 130/74 | HR 81 | Temp 97.3°F | Ht 66.0 in | Wt 148.0 lb

## 2023-06-27 DIAGNOSIS — Z0000 Encounter for general adult medical examination without abnormal findings: Secondary | ICD-10-CM | POA: Diagnosis not present

## 2023-06-27 NOTE — Progress Notes (Signed)
 Subjective:   Dorothy Church is a 88 y.o. female who presents for Medicare Annual (Subsequent) preventive examination.  Visit Complete: In person [facility]    Cardiac Risk Factors include: advanced age (>75men, >21 women);dyslipidemia     Objective:     Today's Vitals   06/27/23 1042  BP: 130/74  Pulse: 81  Temp: (!) 97.3 F (36.3 C)  SpO2: 96%  Weight: 148 lb (67.1 kg)  Height: 5\' 6"  (1.676 m)   Body mass index is 23.89 kg/m.     06/27/2023   10:45 AM 05/18/2023    9:40 PM 06/14/2022    2:14 PM 03/06/2022    9:59 AM 11/07/2021   11:25 AM 05/30/2021   11:30 AM 12/19/2020    7:39 AM  Advanced Directives  Does Patient Have a Medical Advance Directive? Yes Yes Yes Yes Yes Yes Yes  Type of Advance Directive Living will Living will Healthcare Power of [place];Living will Healthcare Power of [place];Living will Healthcare Power of [place];Living will Healthcare Power of [place];Living will   Does patient want to make changes to medical advance directive? No - Patient declined No - Guardian declined No - Patient declined  No - Patient declined No - Patient declined   Copy of Healthcare Power of Attorney in Chart?   Yes - validated most recent copy scanned in chart (See row information)  Yes - validated most recent copy scanned in chart (See row information) Yes - validated most recent copy scanned in chart (See row information)     Current Medications (verified) Outpatient Encounter Medications as of 06/27/2023  Medication Sig   acetaminophen  (TYLENOL ) 500 MG tablet Take 1,000 mg by mouth every 8 (eight) hours as needed (PAIN).   albuterol  (VENTOLIN  HFA) 108 (90 Base) MCG/ACT inhaler Inhale 2 puffs into the lungs every 6 (six) hours as needed for wheezing.   aspirin  EC 81 MG tablet Take 1 tablet (81 mg total) by mouth daily. Swallow whole.   Budeson-Glycopyrrol-Formoterol  (BREZTRI  AEROSPHERE) 160-9-4.8 MCG/ACT AERO Inhale 2 puffs into the lungs in the morning and at  bedtime.   calcium  carbonate (TUMS - DOSED IN MG ELEMENTAL CALCIUM ) 500 MG chewable tablet Chew 1 tablet by mouth 4 (four) times daily as needed for indigestion or heartburn.   Cholecalciferol (VITAMIN D3) 50 MCG (2000 UT) capsule Take 1 capsule (2,000 Units total) by mouth daily.   escitalopram  (LEXAPRO ) 10 MG tablet Take 1 tablet (10 mg total) by mouth daily.   latanoprost (XALATAN) 0.005 % ophthalmic solution Place 1 drop into the right eye at bedtime.   levothyroxine  (SYNTHROID ) 50 MCG tablet TAKE 1 TABLET BY MOUTH EVERY DAY BEFORE BREAKFAST   loratadine  (CLARITIN ) 10 MG tablet TAKE 1 TABLET BY MOUTH EVERY DAY AS NEEDED FOR ALLERGY    meclizine  (ANTIVERT ) 25 MG tablet Take 1 tablet (25 mg total) by mouth 3 (three) times daily as needed for dizziness.   mirtazapine  (REMERON ) 15 MG tablet Take 1 tablet (15 mg total) by mouth at bedtime.   pantoprazole  (PROTONIX ) 40 MG tablet TAKE 1 TABLET BY MOUTH TWICE A DAY   simvastatin  (ZOCOR ) 20 MG tablet TAKE 1 TABLET BY MOUTH EVERYDAY AT BEDTIME   No facility-administered encounter medications on file as of 06/27/2023.    Allergies (verified) Amoxicillin, Cefzil [cefprozil], Cephalexin, Propoxyphene, Relafen [nabumetone], Talwin [pentazocine], and Penicillins   History: Past Medical History:  Diagnosis Date   Abdominal pain    Abnormal CT of liver    Accident due to  mechanical fall without injury 01/28/2022   Acute pain of left shoulder 09/28/2020   Anxiety    Anxiety 04/28/2020   Aortic stenosis    Aortic stenosis 03/04/2012   Overview:   Not confirmed by Dr Gollan.    Aortic valve disorders    Arthritis    Asthma    Asthma 08/07/2012   Barrett's esophagus    [facility] last EGD 02/2018 Dr. Peg Bouton    Barrett's esophagus 03/11/2017   Overview:  Dr Peg Bouton   Carotid artery stenosis 12/30/2018   1-39% b/l carotids       Colon polyps    Colon polyps    COPD (chronic obstructive pulmonary disease) (HCC)    DDD (degenerative disc disease),  cervical    DDD (degenerative disc disease), cervical 04/28/2020   DDD (degenerative disc disease), lumbar 01/22/2017   Depressive disorder    Diverticulosis    Erosive esophagitis    Erosive esophagitis 04/28/2020   Fundic gland polyps of stomach, benign    Gastric polyp    Gastric polyps    Gastritis    Generalized osteoarthritis of hand 05/03/2015   GERD (gastroesophageal reflux disease)    GOA (generalized osteoarthritis) 11/23/2015   Goiter    Hiatal hernia    small    Hiatal hernia    History of colonic polyps 12/08/2013   Hypercholesteremia    Hypothyroidism    Impaired fasting glucose    Impaired fasting glucose 03/04/2012   Overview:   Glucose 111 02/2012   Intrinsic asthma, unspecified    Joint pain of ankle and foot, right 05/17/2015   Left rib fracture    Mechanical complication of knee prosthesis, initial encounter (HCC) 08/25/2017   Obstructive sleep apnea (adult) (pediatric)    used cpap x 10 years per Dr. Auston Left off as of 12/30/18    OSA (obstructive sleep apnea)    used cpap x 10 years per Dr. 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   Stress 04/28/2020   Syncope and collapse    TIA (transient ischemic attack)    TIA (transient ischemic attack) 12/11/2016   TIA (transient ischemic attack) 12/11/2016   UTI (urinary tract infection)    Vitamin D  deficiency    Past Surgical History:  Procedure Laterality Date   BREAST EXCISIONAL BIOPSY Left    BREAST SURGERY     CATARACT EXTRACTION W/PHACO Right 11/07/2021   Procedure: CATARACT EXTRACTION PHACO AND INTRAOCULAR LENS PLACEMENT (IOC) RIGHT  8.87 00:50.8;  Surgeon: Clair Crews, MD;  Location[facility];   Service: Ophthalmology;  Laterality: Right;   COLONOSCOPY     COLONOSCOPY WITH ESOPHAGOGASTRODUODENOSCOPY (EGD)     COLONOSCOPY WITH PROPOFOL  N/A 12/14/2014   Procedure: COLONOSCOPY WITH PROPOFOL ;  Surgeon: Deveron Fly, MD;  Location: [facility] ENDOSCOPY;  Service: Endoscopy;  Laterality: N/A;   COLONOSCOPY WITH PROPOFOL  N/A 03/18/2018   Procedure: COLONOSCOPY WITH PROPOFOL ;  Surgeon: Deveron Fly, MD;  Location: [facility] ENDOSCOPY;  Service: Endoscopy;  Laterality: N/A;   ENUCLEATION     ESOPHAGOGASTRODUODENOSCOPY N/A 08/30/2020   Procedure: ESOPHAGOGASTRODUODENOSCOPY (EGD);  Surgeon: Shane Darling, MD;  Location: [facility] ENDOSCOPY;  Service: Endoscopy;  Laterality: N/A;   ESOPHAGOGASTRODUODENOSCOPY (EGD) WITH PROPOFOL  N/A 12/14/2014   Procedure: ESOPHAGOGASTRODUODENOSCOPY (EGD) WITH PROPOFOL ;  Surgeon: Deveron Fly, MD;  Location: [facility] ENDOSCOPY;  Service: Endoscopy;  Laterality: N/A;   ESOPHAGOGASTRODUODENOSCOPY (EGD) WITH PROPOFOL  N/A 10/18/2016   Procedure: ESOPHAGOGASTRODUODENOSCOPY (EGD) WITH PROPOFOL ;  Surgeon: Deveron Fly, MD;  Location: [facility] ENDOSCOPY;  Service: Endoscopy;  Laterality: N/A;   ESOPHAGOGASTRODUODENOSCOPY (EGD) WITH PROPOFOL  N/A 03/18/2018   Procedure: ESOPHAGOGASTRODUODENOSCOPY (EGD) WITH PROPOFOL ;  Surgeon: Deveron Fly, MD;  Location: [facility] ENDOSCOPY;  Service: Endoscopy;  Laterality: N/A;   ESOPHAGOGASTRODUODENOSCOPY (EGD) WITH PROPOFOL  N/A 12/19/2020   Procedure: ESOPHAGOGASTRODUODENOSCOPY (EGD) WITH PROPOFOL ;  Surgeon: Shane Darling, MD;  Location: [facility] ENDOSCOPY;  Service: Endoscopy;  Laterality: N/A;   EYE SURGERY     FRACTURE SURGERY     HIP SURGERY Left    pin and plate   IR ANGIO INTRA EXTRACRAN SEL COM CAROTID INNOMINATE BILAT MOD SED  10/17/2018   IR ANGIO VERTEBRAL SEL VERTEBRAL BILAT MOD SED  10/17/2018   IR ANGIOGRAM EXTREMITY LEFT  10/17/2018   JOINT REPLACEMENT     Left Total Knee Arthroplasty     Dr Aubry Blase and also had  39 years ago from 2020    nasal polyps     removed x 2 affects sense of smell x 10-15 years from 04/28/20   prosthesis eye implant     REPLACEMENT UNICONDYLAR JOINT KNEE Left    TONSILLECTOMY     TOTAL KNEE REVISION Left 09/13/2017   Procedure: TOTAL KNEE REVISION;  Surgeon: Arlyne Lame, MD;  Location: [facility] ORS;  Service: Orthopedics;  Laterality: Left;   Family History  Problem Relation Age of Onset   Breast cancer Mother        pagets dx 80s   Heart disease Father    Alcohol  abuse Father    Dementia Sister        twin sister 89 dx'ed dementia   Bipolar disorder Brother    Social History   Socioeconomic History   Marital status: Divorced    Spouse name: Not on file   Number of children: Not on file   Years of education: Not on file   Highest education level: Associate degree: occupational, Scientist, product/process development, or vocational program  Occupational History   Not on file  Tobacco Use   Smoking status: Never   Smokeless tobacco: Never  Vaping Use   Vaping status: Never Used  Substance and Sexual Activity   Alcohol  use: Yes    Alcohol /week: 1.0 standard drink of alcohol     Types: 1 Glasses of wine per week    Comment: occas. wine   Drug use: No   Sexual activity: Never  Other Topics Concern   Not on file  Social History Narrative   Twin sister passed away with dementia at 82   Lived in [place], moved to [place] two years ago.   Divorced.      Has a living will.   Would desire CPR, would not desire extraordinary measures.      1 boy and 1 girl daughter [place], son In [place], [place] hes Technical sales engineer   Social Drivers of Health   Financial Resource Strain: Low Risk  (03/21/2023)   Overall Financial Resource Strain (CARDIA)    Difficulty of Paying Living Expenses: Not very hard  Food Insecurity: No Food Insecurity (06/27/2023)   Hunger Vital Sign    Worried About Running Out of Food in the Last Year: Never  true    Ran Out of Food in the Last Year: Never true   Transportation Needs: No Transportation Needs (06/27/2023)   PRAPARE - Administrator, Civil Service (Medical): No    Lack of Transportation (Non-Medical): No  Physical Activity: Unknown (03/21/2023)   Exercise Vital Sign    Days of Exercise per Week: 0 days    Minutes of Exercise per Session: Not on file  Recent Concern: Physical Activity - Inactive (03/21/2023)   Exercise Vital Sign    Days of Exercise per Week: 0 days    Minutes of Exercise per Session: 20 min  Stress: Stress Concern Present (03/21/2023)   [employer] of Occupational Health - Occupational Stress Questionnaire    Feeling of Stress : Rather much  Social Connections: Moderately Integrated (05/18/2023)   Social Connection and Isolation Panel [NHANES]    Frequency of Communication with Friends and Family: More than three times a week    Frequency of Social Gatherings with Friends and Family: Twice a week    Attends Religious Services: More than 4 times per year    Active Member of [employer] or Organizations: Yes    Attends Engineer, structural: More than 4 times per year    Marital Status: Divorced    Tobacco Counseling Counseling given: Not Answered   Clinical Intake:  Pre-visit preparation completed: Yes  Pain : No/denies pain     BMI - recorded: 23.89 Diabetes: No  How often do you need to have someone help you when you read instructions, pamphlets, or other written materials from your doctor or pharmacy?: 1 - Never         Activities of Daily Living    06/27/2023   10:59 AM 05/18/2023    9:40 PM  In your present state of health, do you have any difficulty performing the following activities:  Hearing? 1 1  Comment hearing aids   Vision? 1 1  Difficulty concentrating or making decisions? 0 0  Walking or climbing stairs? 0   Dressing or bathing? 0   Doing errands, shopping? 0   Preparing Food and eating ? N   Using the Toilet? N   In the past six months, have you accidently  leaked urine? Y   Do you have problems with loss of bowel control? N   Managing your Medications? N   Managing your Finances? N   Housekeeping or managing your Housekeeping? N     Patient Care Team: Verma Gobble, NP as PCP - General (Geriatric Medicine) Steven Elam, FNP (Nurse Practitioner) Marc Senior, MD as Consulting Physician (Pulmonary Disease)  Indicate any recent Medical Services you may have received from other than [facility] providers in the past year (date may be approximate).     Assessment:    This is a routine wellness examination for Yarely.  Hearing/Vision screen Vision Screening - Comments:: [facility] Last Exam: 05/2023   Goals Addressed               This Visit's Progress     Maintain Healthy Lifestyle (pt-stated)   On track     Stay active. Healthy diet.        Depression Screen    06/27/2023   10:44 AM 06/20/2023    9:14 AM 10/08/2022    2:50 PM 09/10/2022    1:05 PM 06/14/2022    2:14 PM 03/12/2022   11:45 AM 02/05/2022   11:39 AM  PHQ 2/9  Scores  PHQ - 2 Score 0 0 2 1 0 0 0  PHQ- 9 Score   9   1 4     Fall Risk    06/27/2023   10:44 AM 06/20/2023    9:14 AM 05/07/2023    1:15 PM 10/08/2022    2:50 PM 09/10/2022    1:05 PM  Fall Risk   Falls in the past year? 1 1 1  0 0  Number falls in past yr: 0 1 0 0 0  Injury with Fall? 0 0 1 0 0  Risk for fall due to : No Fall Risks No Fall Risks  No Fall Risks No Fall Risks  Follow up Falls evaluation completed Falls evaluation completed  Falls evaluation completed Falls evaluation completed    MEDICARE RISK AT HOME: Medicare Risk at Home Any stairs in or around the home?: Yes If so, are there any without handrails?: No Home free of loose throw rugs in walkways, pet beds, electrical cords, etc?: Yes Adequate lighting in your home to reduce risk of falls?: Yes Life alert?: Yes Use of a cane, walker or w/c?: Yes Grab bars in the bathroom?: Yes Shower chair or bench in  shower?: Yes Elevated toilet seat or a handicapped toilet?: Yes  TIMED UP AND GO:  Was the test performed?  No    Cognitive Function:    04/28/2018   12:19 PM 10/24/2016   11:38 AM  MMSE - Mini Mental State Exam  Orientation to time 5 5  Orientation to Place 5 5  Registration 3 3  Attention/ Calculation 0 0  Recall 3 3  Language- name 2 objects 0 0  Language- repeat 1 1  Language- follow 3 step command 3 3  Language- read & follow direction 0 0  Write a sentence 0 0  Copy design 0 0  Total score 20 20        06/27/2023   10:46 AM 06/14/2022    2:17 PM 05/27/2019   11:20 AM  6CIT Screen  What Year? 0 points 0 points 0 points  What month? 0 points 0 points 0 points  What time? 0 points 0 points 0 points  Count back from 20 0 points 0 points 0 points  Months in reverse 0 points 0 points 0 points  Repeat phrase 0 points 0 points 0 points  Total Score 0 points 0 points 0 points    Immunizations Immunization History  Administered Date(s) Administered   Fluad Quad(high Dose 65+) 12/05/2019, 11/04/2020   Influenza Whole 12/14/2010   Influenza, High Dose Seasonal PF 12/02/2017, 12/08/2018   Influenza,inj,Quad PF,6+ Mos 11/10/2012, 11/05/2013, 11/10/2014, 10/17/2015, 10/24/2016, 10/17/2021   Influenza-Unspecified 12/04/2011, 11/10/2012, 11/05/2013, 12/07/2018   Moderna Sars-Covid-2 Vaccination 03/03/2019, 03/31/2019, 11/30/2019, 12/26/2019, 07/07/2020   Pfizer(Comirnaty)Fall Seasonal Vaccine 12 years and older 12/01/2021, 08/07/2022   Pneumococcal Conjugate-13 10/07/2009, 04/23/2013, 11/24/2013   Pneumococcal Polysaccharide-23 11/13/2009, 12/19/2018   Td 04/17/2016   Unspecified SARS-COV-2 Vaccination 11/16/2022   Zoster Recombinant(Shingrix) 04/09/2018, 09/15/2018   Zoster, Live 05/08/2007    TDAP status: Up to date  Flu Vaccine status: Up to date  Pneumococcal vaccine status: Up to date  Covid-19 vaccine status: Information provided on how to obtain vaccines.    Qualifies for Shingles Vaccine? Yes   Zostavax completed Yes   Shingrix Completed?: Yes  Screening Tests Health Maintenance  Topic Date Due   COVID-19 Vaccine (9 - Moderna risk 2024-25 season) 05/16/2023   INFLUENZA VACCINE  09/20/2023   Medicare  Annual Wellness (AWV)  06/26/2024   DTaP/Tdap/Td (2 - Tdap) 04/17/2026   Pneumonia Vaccine 61+ Years old  Completed   DEXA SCAN  Completed   Zoster Vaccines- Shingrix  Completed   HPV VACCINES  Aged Out   Meningococcal B Vaccine  Aged Out    Health Maintenance  Health Maintenance Due  Topic Date Due   COVID-19 Vaccine (9 - Moderna risk 2024-25 season) 05/16/2023    Colorectal cancer screening: No longer required.   Mammogram status: No longer required due to age.  Due for bone density- gets through endocrinology   Lung Cancer Screening: (Low Dose CT Chest recommended if Age 35-80 years, 20 pack-year currently smoking OR have quit w/in 15years.) does not qualify.   Lung Cancer Screening Referral: na  Additional Screening:  Hepatitis C Screening: does not qualify  Vision Screening: Recommended annual ophthalmology exams for early detection of glaucoma and other disorders of the eye. Is the patient up to date with their annual eye exam?  Yes  Who is the provider or what is the name of the office in which the patient attends annual eye exams? [facility]  If pt is not established with a provider, would they like to be referred to a provider to establish care? No .   Dental Screening: Recommended annual dental exams for proper oral hygiene Community Resource Referral / Chronic Care Management: CRR required this visit?  No   CCM required this visit?  No     Plan:     I have personally reviewed and noted the following in the patient's chart:   Medical and social history Use of alcohol , tobacco or illicit drugs  Current medications and supplements including opioid prescriptions. Patient is not currently taking opioid  prescriptions. Functional ability and status Nutritional status Physical activity Advanced directives List of other physicians Hospitalizations, surgeries, and ER visits in previous 12 months Vitals Screenings to include cognitive, depression, and falls Referrals and appointments  In addition, I have reviewed and discussed with patient certain preventive protocols, quality metrics, and best practice recommendations. A written personalized care plan for preventive services as well as general preventive health recommendations were provided to patient.     Verma Gobble, NP   06/27/2023

## 2023-06-27 NOTE — Patient Instructions (Signed)
  Ms. Duey , Thank you for taking time to come for your Medicare Wellness Visit. I appreciate your ongoing commitment to your health goals. Please review the following plan we discussed and let me know if I can assist you in the future.    Bone density appears to be due- to get through endocrinology - if not let us  know and we will order.     Goals       Maintain Healthy Lifestyle (pt-stated)      Stay active. Healthy diet.         This is a list of the screening recommended for you and due dates:  Health Maintenance  Topic Date Due   COVID-19 Vaccine (9 - Moderna risk 2024-25 season) 05/16/2023   Flu Shot  09/20/2023   Medicare Annual Wellness Visit  06/26/2024   DTaP/Tdap/Td vaccine (2 - Tdap) 04/17/2026   Pneumonia Vaccine  Completed   DEXA scan (bone density measurement)  Completed   Zoster (Shingles) Vaccine  Completed   HPV Vaccine  Aged Out   Meningitis B Vaccine  Aged Out

## 2023-06-28 ENCOUNTER — Telehealth: Payer: Self-pay

## 2023-06-28 ENCOUNTER — Ambulatory Visit: Payer: Medicare Other | Admitting: Pulmonary Disease

## 2023-06-28 ENCOUNTER — Encounter: Payer: Self-pay | Admitting: Pulmonary Disease

## 2023-06-28 VITALS — BP 102/70 | HR 77 | Temp 97.8°F | Ht 66.0 in | Wt 148.0 lb

## 2023-06-28 DIAGNOSIS — K449 Diaphragmatic hernia without obstruction or gangrene: Secondary | ICD-10-CM | POA: Diagnosis not present

## 2023-06-28 DIAGNOSIS — J4550 Severe persistent asthma, uncomplicated: Secondary | ICD-10-CM | POA: Diagnosis not present

## 2023-06-28 DIAGNOSIS — R0602 Shortness of breath: Secondary | ICD-10-CM

## 2023-06-28 DIAGNOSIS — K2100 Gastro-esophageal reflux disease with esophagitis, without bleeding: Secondary | ICD-10-CM

## 2023-06-28 DIAGNOSIS — Z7952 Long term (current) use of systemic steroids: Secondary | ICD-10-CM | POA: Diagnosis not present

## 2023-06-28 LAB — NITRIC OXIDE: Nitric Oxide: 14

## 2023-06-28 MED ORDER — PREDNISONE 20 MG PO TABS: 20.0000 mg | ORAL_TABLET | Freq: Every day | ORAL | 0 refills | Status: AC

## 2023-06-28 MED ORDER — TEZSPIRE 210 MG/1.91ML ~~LOC~~ SOSY: 210.0000 mg | PREFILLED_SYRINGE | SUBCUTANEOUS | 11 refills | Status: DC

## 2023-06-28 NOTE — Telephone Encounter (Signed)
 Patient seen in office today. Started on Tezspire. Patient signed forms today. Will be sent to the pharmacy team.

## 2023-06-28 NOTE — Patient Instructions (Addendum)
 VISIT SUMMARY:  You came in today for a follow-up visit regarding your moderate persistent asthma. You have been experiencing ongoing shortness of breath, significant phlegm production, and frequent coughing. We discussed your current treatment and future plans to better manage your symptoms.  YOUR PLAN:  -MODERATE PERSISTENT ASTHMA: Moderate persistent asthma is a condition where the airways in your lungs are inflamed and narrowed, causing breathing difficulties. We discussed starting Tezspire, a monthly injection, to help control your asthma symptoms. You can get help with the injections at [facility] if needed. In the meantime, you will take prednisone , 5 tablets daily for 5 days, to help with your symptoms.  The prescription with Tezspire will need to be sent to specialty pharmacy and they will notify you when this is available.  -BRONCHOSPASM: Bronchospasm is when the muscles around your airways tighten, making it hard to breathe. This is contributing to your asthma symptoms. Prednisone  will help to relieve this acute bronchospasm.  -ALLERGY  TO DUST MITES: You have significant allergies to dust mites, which are making your asthma symptoms worse. Managing your environment to reduce dust mites can help improve your symptoms.  INSTRUCTIONS:  Please schedule a follow-up appointment in 2 months to monitor your response to the new treatment regimen and make any necessary adjustments.

## 2023-06-28 NOTE — Progress Notes (Signed)
 Subjective:    Patient ID: Dorothy Church, female    DOB: [birth year], 88 y.o.   MRN: [id number]  Patient Care Team: Verma Gobble, NP as PCP - General (Geriatric Medicine) Steven Elam, FNP (Nurse Practitioner) Marc Senior, MD as Consulting Physician (Pulmonary Disease)  Chief Complaint  Patient presents with   Follow-up    Shortness of breath on exertion. Recurrent wheezing and cough with yellow phlegm.     BACKGROUND/INTERVAL:Patient is an 88 year old lifelong never smoker with a history of persistent asthma who presents for follow-up on the issue of dyspnea.  I first evaluated the patient on 12 December 2022.  Previously she was following with Dr. Nestora Baptise.  At her initial visit with me she was switched to Breztri  2 puffs twice a day, PFTs were obtained.  She was then seen on 04 April 2023 and was referred to pulmonary rehab as well as back to GI for ongoing issues with gastroesophageal reflux.  This is a follow-up visit.   HPI Discussed the use of AI scribe software for clinical note transcription with the patient, who gave verbal consent to proceed.  History of Present Illness   Dorothy Church is an 88 year old female with moderate persistent asthma who presents for follow-up.  She experiences ongoing shortness of breath, which has neither significantly improved nor worsened. She relies on her inhalers for management. There is significant phlegm production and frequent coughing, which she attributes to her asthma. Prednisone  has been effective in alleviating symptoms, though it cannot be used indefinitely.  She has a history of significant allergies, particularly to dust mites, which complicates her asthma management. Her last pulmonary function test was conducted in January and showed significant airway reversibility.  She is currently undergoing physical and occupational therapy twice a week at [facility] following a previous episode where a CT scan  suggested a stroke, which was later determined not to be the case.      DATA 08/16/2014 PFTs: FEV1 0.93 L or 39% predicted, FVC 1.81 L or 57% predicted, FEV1/FVC 51%.  There is a significant bronchodilator response with a 56% net change on FEV1 postbronchodilator to 1.46 L or 62% of predicted.  Fusion capacity was normal by alveolar volume.  Lung volumes normal. 12/12/2022 alpha 1 antitrypsin: MM phenotype, 156 mg/dL (normal) 47/82/9562 CBC with differential and allergen panel: No eosinophilia noted, IgE is 103, RAST panel positive for significant responses to dust mites less significant responses to cat dander, dog dander and ragweed. 12/12/2022 CXR PA and lateral: Hyperinflation, suggestion of COPD, no other acute cardiopulmonary disease 02/21/2023 PFTs: FEV1 0.68 L or 34% predicted, FVC 1.46 L or 55% predicted, FEV1/FVC 47%, there is a very significant postbronchodilator response with a net change of 35% in FEV1 post bronchodilator.  Lung volumes show mild hyperinflation and moderate air trapping.  Diffusion capacity moderately reduced.    Review of Systems A 10 point review of systems was performed and it is as noted above otherwise negative.   Patient Active Problem List   Diagnosis Date Noted   Chronic idiopathic constipation 06/20/2023   Moderate episode of recurrent major depressive disorder (HCC) 06/20/2023   Moderate persistent asthma without complication 06/20/2023   Orthostatic hypotension 05/18/2023   BPPV (benign paroxysmal positional vertigo) 03/13/2022   Allergic rhinitis 03/10/2021   Gastroesophageal reflux disease 09/28/2020   Gastric polyp 04/28/2020   Lower esophageal ring ([place]) 04/28/2020   Insomnia 12/30/2018   Hiatal hernia  Mood disorder (HCC) 03/11/2017   Asthma, chronic, severe persistent, uncomplicated 05/11/2016   OA (osteoarthritis) 04/04/2015   Osteoporosis 10/07/2012   Vitamin D  deficiency    Hypothyroidism    Hyperlipidemia 08/07/2012     Social History   Tobacco Use   Smoking status: Never   Smokeless tobacco: Never  Substance Use Topics   Alcohol  use: Yes    Alcohol /week: 1.0 standard drink of alcohol     Types: 1 Glasses of wine per week    Comment: occas. wine    Allergies  Allergen Reactions   Amoxicillin Other (See Comments)    Pencillins   Cefzil [Cefprozil] Other (See Comments)    Unknown   Cephalexin Other (See Comments)    Unknown   Propoxyphene Other (See Comments)    Unknown   Relafen [Nabumetone] Other (See Comments)    Unknown   Talwin [Pentazocine] Other (See Comments)    Unknown   Penicillins Other (See Comments)    Cannot remember reaction    Current Meds  Medication Sig   acetaminophen  (TYLENOL ) 500 MG tablet Take 1,000 mg by mouth every 8 (eight) hours as needed (PAIN).   albuterol  (VENTOLIN  HFA) 108 (90 Base) MCG/ACT inhaler Inhale 2 puffs into the lungs every 6 (six) hours as needed for wheezing.   aspirin  EC 81 MG tablet Take 1 tablet (81 mg total) by mouth daily. Swallow whole.   Budeson-Glycopyrrol-Formoterol  (BREZTRI  AEROSPHERE) 160-9-4.8 MCG/ACT AERO Inhale 2 puffs into the lungs in the morning and at bedtime.   calcium  carbonate (TUMS - DOSED IN MG ELEMENTAL CALCIUM ) 500 MG chewable tablet Chew 1 tablet by mouth 4 (four) times daily as needed for indigestion or heartburn.   Cholecalciferol (VITAMIN D3) 50 MCG (2000 UT) capsule Take 1 capsule (2,000 Units total) by mouth daily.   escitalopram  (LEXAPRO ) 10 MG tablet Take 1 tablet (10 mg total) by mouth daily.   latanoprost (XALATAN) 0.005 % ophthalmic solution Place 1 drop into the right eye at bedtime.   levothyroxine  (SYNTHROID ) 50 MCG tablet TAKE 1 TABLET BY MOUTH EVERY DAY BEFORE BREAKFAST   loratadine  (CLARITIN ) 10 MG tablet TAKE 1 TABLET BY MOUTH EVERY DAY AS NEEDED FOR ALLERGY    meclizine  (ANTIVERT ) 25 MG tablet Take 1 tablet (25 mg total) by mouth 3 (three) times daily as needed for dizziness.   mirtazapine  (REMERON ) 15  MG tablet Take 1 tablet (15 mg total) by mouth at bedtime.   pantoprazole  (PROTONIX ) 40 MG tablet TAKE 1 TABLET BY MOUTH TWICE A DAY   predniSONE  (DELTASONE ) 20 MG tablet Take 1 tablet (20 mg total) by mouth daily with breakfast for 5 days.   simvastatin  (ZOCOR ) 20 MG tablet TAKE 1 TABLET BY MOUTH EVERYDAY AT BEDTIME   tezepelumab -ekko (TEZSPIRE ) 210 MG/1. syringe Inject 1.91 mLs (210 mg total) into the skin every 30 (thirty) days.    Immunization History  Administered Date(s) Administered   Fluad Quad(high Dose 65+) 12/05/2019, 11/04/2020   Influenza Whole 12/14/2010   Influenza, High Dose Seasonal PF 12/02/2017, 12/08/2018   Influenza,inj,Quad PF,6+ Mos 11/10/2012, 11/05/2013, 11/10/2014, 10/17/2015, 10/24/2016, 10/17/2021   Influenza-Unspecified 12/04/2011, 11/10/2012, 11/05/2013, 12/07/2018   Moderna Sars-Covid-2 Vaccination 03/03/2019, 03/31/2019, 11/30/2019, 12/26/2019, 07/07/2020   Pfizer(Comirnaty)Fall Seasonal Vaccine 12 years and older 12/01/2021, 08/07/2022   Pneumococcal Conjugate-13 10/07/2009, 04/23/2013, 11/24/2013   Pneumococcal Polysaccharide-23 11/13/2009, 12/19/2018   Td 04/17/2016   Unspecified SARS-COV-2 Vaccination 11/16/2022   Zoster Recombinant(Shingrix) 04/09/2018, 09/15/2018   Zoster, Live 05/08/2007        Objective:  BP 102/70 (BP Location: Left Arm, Patient Position: Sitting, Cuff Size: Normal)   Pulse 77   Temp 97.8 F (36.6 C) (Temporal)   Ht 5\' 6"  (1.676 m)   Wt 148 lb (67.1 kg)   SpO2 95%   BMI 23.89 kg/m   SpO2: 95 %  GENERAL: Developed, well-nourished woman, no acute distress, fully ambulatory.  No conversational dyspnea. HEAD: Normocephalic, atraumatic.  EYES: Pupils equal, round, reactive to light.  No scleral icterus.  MOUTH: Dentition intact, oral mucosa moist.  No thrush. NECK: Supple. No thyromegaly. Trachea midline. No JVD.  No adenopathy. PULMONARY: Good air entry bilaterally.  Scattered end expiratory wheezes  noted. CARDIOVASCULAR: S1 and S2. Regular rate and rhythm.  No rubs, murmurs or gallops heard. ABDOMEN: Benign. MUSCULOSKELETAL: No joint deformity, no clubbing, no edema.  NEUROLOGIC: No overt focal deficit, no gait disturbance, speech is fluent. SKIN: Intact,warm,dry. PSYCH: Behavior normal.  Lab Results  Component Value Date   NITRICOXIDE 14 06/28/2023  *Trend: 31>>14 *No evidence of type II inflammation noted       Assessment & Plan:     ICD-10-CM   1. Severe persistent asthma, poorly-controlled, uncomplicated  J45.50 Nitric oxide     2. Shortness of breath  R06.02 Nitric oxide     3. Hiatal hernia with GERD and esophagitis  K44.9    K21.00       Orders Placed This Encounter  Procedures   Nitric oxide     Meds ordered this encounter  Medications   predniSONE  (DELTASONE ) 20 MG tablet    Sig: Take 1 tablet (20 mg total) by mouth daily with breakfast for 5 days.    Dispense:  5 tablet    Refill:  0   tezepelumab -ekko (TEZSPIRE ) 210 MG/1. syringe    Sig: Inject 1.91 mLs (210 mg total) into the skin every 30 (thirty) days.    Dispense:  1.91 mL    Refill:  11   Discussion:    Severe persistent asthma, steroid-dependent She has the persistent asthma with ongoing symptoms of dyspnea, wheezing, significant sputum production, and cough. Previous prednisone  use has been beneficial, but long-term use is not advisable.  She is to relying on frequent steroid tapers.  A high-resolution CT scan of the chest is planned to further evaluate her condition. Consideration for Tezspire , a monthly auto-injector, is discussed to better control her asthma symptoms. Administration assistance is available at [facility], if needed. - Order high-resolution CT scan of the chest - Check level of airway inflammation: Nitric oxide  14 ppb today - Initiate Tezspire  (monthly injection) - Prescribe prednisone  5 tablets daily for 5 days  Bronchospasm Bronchospasm is contributing to her asthma  symptoms, including wheezing and dyspnea. Prednisone  is prescribed to address the acute bronchospasm.  Allergy  to dust mites She has significant allergies to dust mites, contributing to her asthma symptoms.  Follow-up Follow-up is necessary to monitor her response to the new treatment regimen and adjust as needed. - Schedule follow-up appointment in 2 months      Advised if symptoms do not improve or worsen, to please contact office for sooner follow up or seek emergency care.    I spent 42 minutes of dedicated to the care of this patient on the date of this encounter to include pre-visit review of records, face-to-face time with the patient discussing conditions above, post visit ordering of testing, clinical documentation with the electronic health record, making appropriate referrals as documented, and communicating necessary findings to members of the patients care  team.     Acey Ace, MD Advanced Bronchoscopy PCC[facility]    *This note was generated using voice recognition software/Dragon and/or AI transcription program.  Despite best efforts to proofread, errors can occur which can change the meaning. Any transcriptional errors that result from this process are unintentional and may not be fully corrected at the time of dictation.

## 2023-07-01 ENCOUNTER — Telehealth: Payer: Self-pay

## 2023-07-01 NOTE — Telephone Encounter (Signed)
 Received Tezspire new start paperwork via [employer] [facility] a Prior Authorization request to [employer] for TEZSPIRE via CoverMyMeds. Submission pending OV note to be signed  Key: [id number]  Geraldene Kleine, PharmD, MPH, BCPS, CPP Clinical Pharmacist (Rheumatology and Pulmonology)

## 2023-07-01 NOTE — Telephone Encounter (Signed)
 Received. We will start Tezspire new start benefits investigation

## 2023-07-03 DIAGNOSIS — R251 Tremor, unspecified: Secondary | ICD-10-CM | POA: Diagnosis not present

## 2023-07-03 DIAGNOSIS — I951 Orthostatic hypotension: Secondary | ICD-10-CM | POA: Diagnosis not present

## 2023-07-03 DIAGNOSIS — Z741 Need for assistance with personal care: Secondary | ICD-10-CM | POA: Diagnosis not present

## 2023-07-03 DIAGNOSIS — H8110 Benign paroxysmal vertigo, unspecified ear: Secondary | ICD-10-CM | POA: Diagnosis not present

## 2023-07-03 DIAGNOSIS — M6281 Muscle weakness (generalized): Secondary | ICD-10-CM | POA: Diagnosis not present

## 2023-07-03 DIAGNOSIS — R42 Dizziness and giddiness: Secondary | ICD-10-CM | POA: Diagnosis not present

## 2023-07-03 DIAGNOSIS — R2689 Other abnormalities of gait and mobility: Secondary | ICD-10-CM | POA: Diagnosis not present

## 2023-07-03 DIAGNOSIS — J4550 Severe persistent asthma, uncomplicated: Secondary | ICD-10-CM | POA: Diagnosis not present

## 2023-07-03 DIAGNOSIS — R278 Other lack of coordination: Secondary | ICD-10-CM | POA: Diagnosis not present

## 2023-07-03 DIAGNOSIS — G4700 Insomnia, unspecified: Secondary | ICD-10-CM | POA: Diagnosis not present

## 2023-07-03 DIAGNOSIS — G4733 Obstructive sleep apnea (adult) (pediatric): Secondary | ICD-10-CM | POA: Diagnosis not present

## 2023-07-04 DIAGNOSIS — G4700 Insomnia, unspecified: Secondary | ICD-10-CM | POA: Diagnosis not present

## 2023-07-04 DIAGNOSIS — M6281 Muscle weakness (generalized): Secondary | ICD-10-CM | POA: Diagnosis not present

## 2023-07-04 DIAGNOSIS — G4733 Obstructive sleep apnea (adult) (pediatric): Secondary | ICD-10-CM | POA: Diagnosis not present

## 2023-07-04 DIAGNOSIS — R2689 Other abnormalities of gait and mobility: Secondary | ICD-10-CM | POA: Diagnosis not present

## 2023-07-04 DIAGNOSIS — Z741 Need for assistance with personal care: Secondary | ICD-10-CM | POA: Diagnosis not present

## 2023-07-04 DIAGNOSIS — I951 Orthostatic hypotension: Secondary | ICD-10-CM | POA: Diagnosis not present

## 2023-07-04 DIAGNOSIS — J4550 Severe persistent asthma, uncomplicated: Secondary | ICD-10-CM | POA: Diagnosis not present

## 2023-07-04 DIAGNOSIS — H8110 Benign paroxysmal vertigo, unspecified ear: Secondary | ICD-10-CM | POA: Diagnosis not present

## 2023-07-04 DIAGNOSIS — R251 Tremor, unspecified: Secondary | ICD-10-CM | POA: Diagnosis not present

## 2023-07-04 DIAGNOSIS — R278 Other lack of coordination: Secondary | ICD-10-CM | POA: Diagnosis not present

## 2023-07-04 DIAGNOSIS — R42 Dizziness and giddiness: Secondary | ICD-10-CM | POA: Diagnosis not present

## 2023-07-05 DIAGNOSIS — R42 Dizziness and giddiness: Secondary | ICD-10-CM | POA: Diagnosis not present

## 2023-07-05 DIAGNOSIS — R2689 Other abnormalities of gait and mobility: Secondary | ICD-10-CM | POA: Diagnosis not present

## 2023-07-05 DIAGNOSIS — G4700 Insomnia, unspecified: Secondary | ICD-10-CM | POA: Diagnosis not present

## 2023-07-05 DIAGNOSIS — M6281 Muscle weakness (generalized): Secondary | ICD-10-CM | POA: Diagnosis not present

## 2023-07-05 DIAGNOSIS — I951 Orthostatic hypotension: Secondary | ICD-10-CM | POA: Diagnosis not present

## 2023-07-05 DIAGNOSIS — Z741 Need for assistance with personal care: Secondary | ICD-10-CM | POA: Diagnosis not present

## 2023-07-05 DIAGNOSIS — G4733 Obstructive sleep apnea (adult) (pediatric): Secondary | ICD-10-CM | POA: Diagnosis not present

## 2023-07-05 DIAGNOSIS — R278 Other lack of coordination: Secondary | ICD-10-CM | POA: Diagnosis not present

## 2023-07-05 DIAGNOSIS — H8110 Benign paroxysmal vertigo, unspecified ear: Secondary | ICD-10-CM | POA: Diagnosis not present

## 2023-07-05 DIAGNOSIS — J4550 Severe persistent asthma, uncomplicated: Secondary | ICD-10-CM | POA: Diagnosis not present

## 2023-07-05 DIAGNOSIS — R251 Tremor, unspecified: Secondary | ICD-10-CM | POA: Diagnosis not present

## 2023-07-08 DIAGNOSIS — R251 Tremor, unspecified: Secondary | ICD-10-CM | POA: Diagnosis not present

## 2023-07-08 DIAGNOSIS — Z741 Need for assistance with personal care: Secondary | ICD-10-CM | POA: Diagnosis not present

## 2023-07-08 DIAGNOSIS — H8110 Benign paroxysmal vertigo, unspecified ear: Secondary | ICD-10-CM | POA: Diagnosis not present

## 2023-07-08 DIAGNOSIS — R278 Other lack of coordination: Secondary | ICD-10-CM | POA: Diagnosis not present

## 2023-07-08 DIAGNOSIS — M6281 Muscle weakness (generalized): Secondary | ICD-10-CM | POA: Diagnosis not present

## 2023-07-08 DIAGNOSIS — I951 Orthostatic hypotension: Secondary | ICD-10-CM | POA: Diagnosis not present

## 2023-07-08 DIAGNOSIS — J4550 Severe persistent asthma, uncomplicated: Secondary | ICD-10-CM | POA: Diagnosis not present

## 2023-07-08 DIAGNOSIS — R42 Dizziness and giddiness: Secondary | ICD-10-CM | POA: Diagnosis not present

## 2023-07-08 DIAGNOSIS — R2689 Other abnormalities of gait and mobility: Secondary | ICD-10-CM | POA: Diagnosis not present

## 2023-07-08 DIAGNOSIS — G4700 Insomnia, unspecified: Secondary | ICD-10-CM | POA: Diagnosis not present

## 2023-07-08 DIAGNOSIS — G4733 Obstructive sleep apnea (adult) (pediatric): Secondary | ICD-10-CM | POA: Diagnosis not present

## 2023-07-10 DIAGNOSIS — J4550 Severe persistent asthma, uncomplicated: Secondary | ICD-10-CM | POA: Diagnosis not present

## 2023-07-10 DIAGNOSIS — R42 Dizziness and giddiness: Secondary | ICD-10-CM | POA: Diagnosis not present

## 2023-07-10 DIAGNOSIS — R251 Tremor, unspecified: Secondary | ICD-10-CM | POA: Diagnosis not present

## 2023-07-10 DIAGNOSIS — R2689 Other abnormalities of gait and mobility: Secondary | ICD-10-CM | POA: Diagnosis not present

## 2023-07-10 DIAGNOSIS — R278 Other lack of coordination: Secondary | ICD-10-CM | POA: Diagnosis not present

## 2023-07-10 DIAGNOSIS — H8110 Benign paroxysmal vertigo, unspecified ear: Secondary | ICD-10-CM | POA: Diagnosis not present

## 2023-07-10 DIAGNOSIS — G4733 Obstructive sleep apnea (adult) (pediatric): Secondary | ICD-10-CM | POA: Diagnosis not present

## 2023-07-10 DIAGNOSIS — G4700 Insomnia, unspecified: Secondary | ICD-10-CM | POA: Diagnosis not present

## 2023-07-10 DIAGNOSIS — I951 Orthostatic hypotension: Secondary | ICD-10-CM | POA: Diagnosis not present

## 2023-07-10 DIAGNOSIS — M6281 Muscle weakness (generalized): Secondary | ICD-10-CM | POA: Diagnosis not present

## 2023-07-10 DIAGNOSIS — Z741 Need for assistance with personal care: Secondary | ICD-10-CM | POA: Diagnosis not present

## 2023-07-11 ENCOUNTER — Encounter: Payer: Self-pay | Admitting: Pulmonary Disease

## 2023-07-11 NOTE — Telephone Encounter (Signed)
 PA submitted for Tezspire  with clinicals  Key: [id number]  Geraldene Kleine, PharmD, MPH, BCPS, CPP Clinical Pharmacist (Rheumatology and Pulmonology)

## 2023-07-15 DIAGNOSIS — R278 Other lack of coordination: Secondary | ICD-10-CM | POA: Diagnosis not present

## 2023-07-15 DIAGNOSIS — M6281 Muscle weakness (generalized): Secondary | ICD-10-CM | POA: Diagnosis not present

## 2023-07-15 DIAGNOSIS — H8110 Benign paroxysmal vertigo, unspecified ear: Secondary | ICD-10-CM | POA: Diagnosis not present

## 2023-07-15 DIAGNOSIS — G4700 Insomnia, unspecified: Secondary | ICD-10-CM | POA: Diagnosis not present

## 2023-07-15 DIAGNOSIS — G4733 Obstructive sleep apnea (adult) (pediatric): Secondary | ICD-10-CM | POA: Diagnosis not present

## 2023-07-15 DIAGNOSIS — R2689 Other abnormalities of gait and mobility: Secondary | ICD-10-CM | POA: Diagnosis not present

## 2023-07-15 DIAGNOSIS — Z741 Need for assistance with personal care: Secondary | ICD-10-CM | POA: Diagnosis not present

## 2023-07-15 DIAGNOSIS — R251 Tremor, unspecified: Secondary | ICD-10-CM | POA: Diagnosis not present

## 2023-07-15 DIAGNOSIS — R42 Dizziness and giddiness: Secondary | ICD-10-CM | POA: Diagnosis not present

## 2023-07-15 DIAGNOSIS — J4550 Severe persistent asthma, uncomplicated: Secondary | ICD-10-CM | POA: Diagnosis not present

## 2023-07-15 DIAGNOSIS — I951 Orthostatic hypotension: Secondary | ICD-10-CM | POA: Diagnosis not present

## 2023-07-17 DIAGNOSIS — R2689 Other abnormalities of gait and mobility: Secondary | ICD-10-CM | POA: Diagnosis not present

## 2023-07-17 DIAGNOSIS — J4550 Severe persistent asthma, uncomplicated: Secondary | ICD-10-CM | POA: Diagnosis not present

## 2023-07-17 DIAGNOSIS — R251 Tremor, unspecified: Secondary | ICD-10-CM | POA: Diagnosis not present

## 2023-07-17 DIAGNOSIS — Z741 Need for assistance with personal care: Secondary | ICD-10-CM | POA: Diagnosis not present

## 2023-07-17 DIAGNOSIS — H8110 Benign paroxysmal vertigo, unspecified ear: Secondary | ICD-10-CM | POA: Diagnosis not present

## 2023-07-17 DIAGNOSIS — I951 Orthostatic hypotension: Secondary | ICD-10-CM | POA: Diagnosis not present

## 2023-07-17 DIAGNOSIS — M6281 Muscle weakness (generalized): Secondary | ICD-10-CM | POA: Diagnosis not present

## 2023-07-17 DIAGNOSIS — G4733 Obstructive sleep apnea (adult) (pediatric): Secondary | ICD-10-CM | POA: Diagnosis not present

## 2023-07-17 DIAGNOSIS — R278 Other lack of coordination: Secondary | ICD-10-CM | POA: Diagnosis not present

## 2023-07-17 DIAGNOSIS — G4700 Insomnia, unspecified: Secondary | ICD-10-CM | POA: Diagnosis not present

## 2023-07-17 DIAGNOSIS — R42 Dizziness and giddiness: Secondary | ICD-10-CM | POA: Diagnosis not present

## 2023-07-22 NOTE — Telephone Encounter (Signed)
 Received a fax regarding Prior Authorization from [employer] for TEZSPIRE . Authorization has been DENIED because: (1) You need to try two (2) of these covered drugs: (a) Dupixent (b) Fasenra (2) OR your doctor needs to give us  specific medical reasons why two (2) of the covered drug(s) are not appropriate for you.  Absolute eos on 05/16/2023 was 300 so would qualify for Fasenra or Dupixent. Routing to Dr. Viva Grise for advisement on which one she'd prefer  Geraldene Kleine, PharmD, MPH, BCPS, CPP Clinical Pharmacist (Rheumatology and Pulmonology)

## 2023-07-22 NOTE — Telephone Encounter (Signed)
 Lets try Fasenra.

## 2023-07-23 ENCOUNTER — Telehealth: Payer: Self-pay | Admitting: Pharmacist

## 2023-07-23 ENCOUNTER — Ambulatory Visit

## 2023-07-23 NOTE — Telephone Encounter (Signed)
 Tezspire  denied.  Submitted a Prior Authorization request to [employer] for [facility] via CoverMyMeds. Will update once we receive a response.  Key: [id number]

## 2023-07-26 ENCOUNTER — Other Ambulatory Visit (HOSPITAL_COMMUNITY): Payer: Self-pay

## 2023-07-26 NOTE — Telephone Encounter (Signed)
 Received notification from [employer] regarding a prior authorization for [facility]. Authorization has been APPROVED from 07/23/2023 to 01/22/2024. Approval letter sent to scan center.  Per test claim, copay for 28 days supply is $873.46  Authorization # [id number]  Spoke with patient regarding AZ[employer]Me patinet assistance application. She is requesting application be sent via [employer]. Emailed to [place] to send to patient.  Prescriber form faxed to [facility] office to have signed by Dr. Viva Grise and faxed back to pharmacy team  Geraldene Kleine, PharmD, MPH, BCPS, CPP Clinical Pharmacist (Rheumatology and Pulmonology)

## 2023-07-29 ENCOUNTER — Other Ambulatory Visit: Payer: Self-pay | Admitting: Nurse Practitioner

## 2023-07-29 ENCOUNTER — Other Ambulatory Visit: Payer: Self-pay | Admitting: Family Medicine

## 2023-07-29 DIAGNOSIS — G4700 Insomnia, unspecified: Secondary | ICD-10-CM

## 2023-07-29 DIAGNOSIS — E039 Hypothyroidism, unspecified: Secondary | ICD-10-CM

## 2023-07-29 MED ORDER — MIRTAZAPINE 15 MG PO TABS: 15.0000 mg | ORAL_TABLET | Freq: Every day | ORAL | 1 refills | Status: DC

## 2023-07-29 NOTE — Telephone Encounter (Signed)
 Copied from CRM [id number]. Topic: Clinical - Medication Refill >> Jul 29, 2023  1:20 PM Carrielelia G wrote: Medication: mirtazapine  (REMERON ) 15 MG tablet  Has the patient contacted their pharmacy? Yes (Agent: If no, request that the patient contact the pharmacy for the refill. If patient does not wish to contact the pharmacy document the reason why and proceed with request.) (Agent: If yes, when and what did the pharmacy advise?)  This is the patient's preferred pharmacy:  [id number] [place] [facility] - [address] [place] [address] Phone: [phone] Fax: [phone]   Is this the correct pharmacy for this prescription? Yes If no, delete pharmacy and type the correct one.     Is the patient out of the medication? No  (2 days left)   Has the patient been seen for an appointment in the last year OR does the patient have an upcoming appointment? Yes  Can we respond through MyChart? Yes  Agent: Please be advised that Rx refills may take up to 3 business days. We ask that you follow-up with your pharmacy.

## 2023-07-29 NOTE — Telephone Encounter (Signed)
 High risk warning

## 2023-07-31 NOTE — Telephone Encounter (Addendum)
 Received signed provider form Fasenra PAP via fax on Onbase. Retained in Onbase for submission once patient form is received

## 2023-08-29 ENCOUNTER — Ambulatory Visit: Admitting: Pulmonary Disease

## 2023-08-29 ENCOUNTER — Encounter: Payer: Self-pay | Admitting: Pulmonary Disease

## 2023-08-29 VITALS — BP 120/76 | HR 76 | Temp 98.2°F | Ht 66.0 in | Wt 149.0 lb

## 2023-08-29 DIAGNOSIS — K2100 Gastro-esophageal reflux disease with esophagitis, without bleeding: Secondary | ICD-10-CM

## 2023-08-29 DIAGNOSIS — K449 Diaphragmatic hernia without obstruction or gangrene: Secondary | ICD-10-CM

## 2023-08-29 DIAGNOSIS — J4550 Severe persistent asthma, uncomplicated: Secondary | ICD-10-CM

## 2023-08-29 DIAGNOSIS — R0602 Shortness of breath: Secondary | ICD-10-CM

## 2023-08-29 DIAGNOSIS — Z7952 Long term (current) use of systemic steroids: Secondary | ICD-10-CM

## 2023-08-29 LAB — NITRIC OXIDE: Nitric Oxide: 20

## 2023-08-29 MED ORDER — PREDNISONE 10 MG PO TABS: 10.0000 mg | ORAL_TABLET | Freq: Every day | ORAL | 2 refills | Status: DC

## 2023-08-29 NOTE — Patient Instructions (Signed)
 VISIT SUMMARY:  Dorothy Church, you visited us  today for a follow-up on your severe persistent asthma. You have been experiencing significant shortness of breath, especially when walking in heat and humidity, which has made it difficult for you to stay indoors comfortably. We discussed your current treatment and made some adjustments to help manage your symptoms better.  YOUR PLAN:  -SEVERE PERSISTENT ASTHMA: Severe persistent asthma is a long-term condition where your airways are always inflamed, making it hard to breathe. We will start you on Fasenra, a medication that helps reduce airway inflammation. You will coordinate with the pharmacy team for the delivery and administration of this medication. Until the [facility] treatment is established, continue taking prednisone  at 10 mg daily. We will reassess your asthma control in 2-3 months and monitor your airway inflammation levels.  INSTRUCTIONS:  Coordinate with the pharmacy team to initiate Fasenra treatment and administer the first injection in [place] or locally. Continue taking prednisone  at 10 mg daily. Schedule a follow-up appointment in 2-3 months to reassess your asthma control and monitor airway inflammation levels.

## 2023-08-29 NOTE — Progress Notes (Signed)
 Subjective:    Patient ID: Dorothy Church, female    DOB: [birth year], 88 y.o.   MRN: [id number]  Patient Care Team: Caro Harlene POUR, NP as PCP - General (Geriatric Medicine) Avram Barnie NOVAK, FNP (Nurse Practitioner) Tamea Dedra CROME, MD as Consulting Physician (Pulmonary Disease)  Chief Complaint  Patient presents with   Follow-up    Cough, shortness of breath and wheezing.     BACKGROUND/INTERVAL:Patient is an 88 year old lifelong never smoker with a history of persistent asthma who presents for follow-up on the issue of dyspnea.  I first evaluated the patient on 12 December 2022.  Previously she was following with Dr. Alm Cellar.  At her initial visit with me she was switched reztri 2 puffs twice a day, PFTs were obtained.  She was then seen on 28 Jun 2023 at that time was prescribed biologic for control of her asthma.  This is a follow-up visit.   HPI Discussed the use of AI scribe software for clinical note transcription with the patient, who gave verbal consent to proceed.  History of Present Illness   Dorothy Church is an 88 year old female with severe persistent asthma who presents for follow-up.  She experiences significant dyspnea, describing it as 'huffing and puffing', which is exacerbated by walking, particularly in heat and humidity. This has impacted her ability to stay indoors comfortably.  She is currently awaiting the initiation of Fasenra and is coordinating with the pharmacy team for medication delivery and administration.  She has been requiring frequent tapers of prednisone , and it does improve her symptoms when she is on it.      DATA 08/16/2014 PFTs: FEV1 0.93 L or 39% predicted, FVC 1.81 L or 57% predicted, FEV1/FVC 51%.  There is a significant bronchodilator response with a 56% net change on FEV1 postbronchodilator to 1.46 L or 62% of predicted.  Fusion capacity was normal by alveolar volume.  Lung volumes normal. 12/12/2022 alpha 1 antitrypsin:  MM phenotype, 156 mg/dL (normal) 89/76/7975 CBC with differential and allergen panel: No eosinophilia noted, IgE is 103, RAST panel positive for significant responses to dust mites less significant responses to cat dander, dog dander and ragweed. 12/12/2022 CXR PA and lateral: Hyperinflation, suggestion of COPD, no other acute cardiopulmonary disease 02/21/2023 PFTs: FEV1 0.68 L or 34% predicted, FVC 1.46 L or 55% predicted, FEV1/FVC 47%, there is a very significant postbronchodilator response with a net change of 35% in FEV1 post bronchodilator.  Lung volumes show mild hyperinflation and moderate air trapping.  Diffusion capacity moderately reduced.  Review of Systems A 10 point review of systems was performed and it is as noted above otherwise negative.   Patient Active Problem List   Diagnosis Date Noted   Chronic idiopathic constipation 06/20/2023   Moderate episode of recurrent major depressive disorder (HCC) 06/20/2023   Moderate persistent asthma without complication 06/20/2023   Orthostatic hypotension 05/18/2023   BPPV (benign paroxysmal positional vertigo) 03/13/2022   Allergic rhinitis 03/10/2021   Gastroesophageal reflux disease 09/28/2020   Gastric polyp 04/28/2020   Lower esophageal ring ([place]) 04/28/2020   Insomnia 12/30/2018   Hiatal hernia    Mood disorder (HCC) 03/11/2017   Asthma, chronic, severe persistent, uncomplicated 05/11/2016   OA (osteoarthritis) 04/04/2015   Osteoporosis 10/07/2012   Vitamin D  deficiency    Hypothyroidism    Hyperlipidemia 08/07/2012    Social History   Tobacco Use   Smoking status: Never   Smokeless tobacco: Never  Substance Use Topics  Alcohol  use: Yes    Alcohol /week: 1.0 standard drink of alcohol     Types: 1 Glasses of wine per week    Comment: occas. wine    Allergies  Allergen Reactions   Amoxicillin Other (See Comments)    Pencillins   Cefzil [Cefprozil] Other (See Comments)    Unknown   Cephalexin Other (See  Comments)    Unknown   Propoxyphene Other (See Comments)    Unknown   Relafen [Nabumetone] Other (See Comments)    Unknown   Talwin [Pentazocine] Other (See Comments)    Unknown   Penicillins Other (See Comments)    Cannot remember reaction    Current Meds  Medication Sig   acetaminophen  (TYLENOL ) 500 MG tablet Take 1,000 mg by mouth every 8 (eight) hours as needed (PAIN).   albuterol  (VENTOLIN  HFA) 108 (90 Base) MCG/ACT inhaler Inhale 2 puffs into the lungs every 6 (six) hours as needed for wheezing.   aspirin  EC 81 MG tablet Take 1 tablet (81 mg total) by mouth daily. Swallow whole.   Budeson-Glycopyrrol-Formoterol  (BREZTRI  AEROSPHERE) 160-9-4.8 MCG/ACT AERO Inhale 2 puffs into the lungs in the morning and at bedtime.   calcium  carbonate (TUMS - DOSED IN MG ELEMENTAL CALCIUM ) 500 MG chewable tablet Chew 1 tablet by mouth 4 (four) times daily as needed for indigestion or heartburn.   Cholecalciferol (VITAMIN D3) 50 MCG (2000 UT) capsule Take 1 capsule (2,000 Units total) by mouth daily.   escitalopram  (LEXAPRO ) 10 MG tablet Take 1 tablet (10 mg total) by mouth daily.   latanoprost (XALATAN) 0.005 % ophthalmic solution Place 1 drop into the right eye at bedtime.   levothyroxine  (SYNTHROID ) 50 MCG tablet TAKE 1 TABLET BY MOUTH EVERY DAY BEFORE BREAKFAST   loratadine  (CLARITIN ) 10 MG tablet TAKE 1 TABLET BY MOUTH EVERY DAY AS NEEDED FOR ALLERGY    meclizine  (ANTIVERT ) 25 MG tablet Take 1 tablet (25 mg total) by mouth 3 (three) times daily as needed for dizziness.   mirtazapine  (REMERON ) 15 MG tablet Take 1 tablet (15 mg total) by mouth at bedtime.   pantoprazole  (PROTONIX ) 40 MG tablet TAKE 1 TABLET BY MOUTH TWICE A DAY   predniSONE  (DELTASONE ) 10 MG tablet Take 1 tablet (10 mg total) by mouth daily with breakfast.   simvastatin  (ZOCOR ) 20 MG tablet TAKE 1 TABLET BY MOUTH EVERYDAY AT BEDTIME    Immunization History  Administered Date(s) Administered   Fluad Quad(high Dose 65+)  12/05/2019, 11/04/2020   Influenza Whole 12/14/2010   Influenza, High Dose Seasonal PF 12/02/2017, 12/08/2018   Influenza,inj,Quad PF,6+ Mos 11/10/2012, 11/05/2013, 11/10/2014, 10/17/2015, 10/24/2016, 10/17/2021   Influenza-Unspecified 12/04/2011, 11/10/2012, 11/05/2013, 12/07/2018   Moderna Sars-Covid-2 Vaccination 03/03/2019, 03/31/2019, 11/30/2019, 12/26/2019, 07/07/2020   Pfizer(Comirnaty)Fall Seasonal Vaccine 12 years and older 12/01/2021, 08/07/2022   Pneumococcal Conjugate-13 10/07/2009, 04/23/2013, 11/24/2013   Pneumococcal Polysaccharide-23 11/13/2009, 12/19/2018   Td 04/17/2016   Unspecified SARS-COV-2 Vaccination 11/16/2022   Zoster Recombinant(Shingrix) 04/09/2018, 09/15/2018   Zoster, Live 05/08/2007        Objective:     BP 120/76 (BP Location: Left Arm, Patient Position: Sitting, Cuff Size: Normal)   Pulse 76   Temp 98.2 F (36.8 C) (Oral)   Ht 5' 6 (1.676 m)   Wt 149 lb (67.6 kg)   SpO2 95%   BMI 24.05 kg/m   GENERAL: Developed, well-nourished woman, no acute distress, fully ambulatory.  No conversational dyspnea. HEAD: Normocephalic, atraumatic.  EYES: Pupils equal, round, reactive to light.  No scleral icterus.  MOUTH:  Dentition intact, oral mucosa moist.  No thrush. NECK: Supple. No thyromegaly. Trachea midline. No JVD.  No adenopathy. PULMONARY: Poor air entry bilaterally.  Coarse, no overt adventitious sounds. CARDIOVASCULAR: S1 and S2. Regular rate and rhythm.  No rubs, murmurs or gallops heard. ABDOMEN: Benign. MUSCULOSKELETAL: No joint deformity, no clubbing, no edema.  NEUROLOGIC: No overt focal deficit, no gait disturbance, speech is fluent. SKIN: Intact,warm,dry. PSYCH: Mood and behavior normal.    Lab Results  Component Value Date   NITRICOXIDE 20 08/29/2023  *Low level of type II inflammation however, increased from last measurement *Trend: 31>>14>>20   Assessment & Plan:     ICD-10-CM   1. Severe persistent asthma dependent on  systemic steroids  J45.50 Nitric oxide    Z79.52     2. Hiatal hernia with GERD and esophagitis  K44.9    K21.00     3. Shortness of breath  R06.02       Orders Placed This Encounter  Procedures   Nitric oxide     Standing Status:   Future    Expiration Date:   08/28/2024    Meds ordered this encounter  Medications   predniSONE  (DELTASONE ) 10 MG tablet    Sig: Take 1 tablet (10 mg total) by mouth daily with breakfast.    Dispense:  30 tablet    Refill:  2   Discussion:    Severe persistent asthma, steroid-dependent Dorothy Church presents with severe persistent asthma exacerbated by heat and humidity, causing difficulty walking due to shortness of breath. Current airway inflammation levels are on the rise. - Initiate Fasenra treatment with the pharmacy team (patient has been approved, needs teaching for administration. - Maintain prednisone  at 10 mg daily until [place] treatment is established, taper off as tolerated once Fasenra is started. - Reassess asthma control in 2-3 months. - Monitor airway inflammation levels.     Advised if symptoms do not improve or worsen, to please contact office for sooner follow up or seek emergency care.    I spent 40 minutes of dedicated to the care of this patient on the date of this encounter to include pre-visit review of records, face-to-face time with the patient discussing conditions above, post visit ordering of testing, clinical documentation with the electronic health record, making appropriate referrals as documented, and communicating necessary findings to members of the patients care team.     C. Leita Sanders, MD Advanced Bronchoscopy PCCM [facility]    *This note was generated using voice recognition software/Dragon and/or AI transcription program.  Despite best efforts to proofread, errors can occur which can change the meaning. Any transcriptional errors that result from this process are unintentional and  may not be fully corrected at the time of dictation.

## 2023-09-02 IMAGING — MG MM DIGITAL SCREENING BILAT W/ TOMO AND CAD
8 series · 9 of 24 positions shown · non-contrast
Comparison: Previous exam(s).

CLINICAL DATA: Screening.

EXAM:
DIGITAL SCREENING BILATERAL MAMMOGRAM WITH TOMOSYNTHESIS AND CAD
TECHNIQUE: Bilateral screening digital craniocaudal and mediolateral oblique
mammograms were obtained. Bilateral screening digital breast
tomosynthesis was performed. The images were evaluated with
computer-aided detection.

[L CC synth-2D]
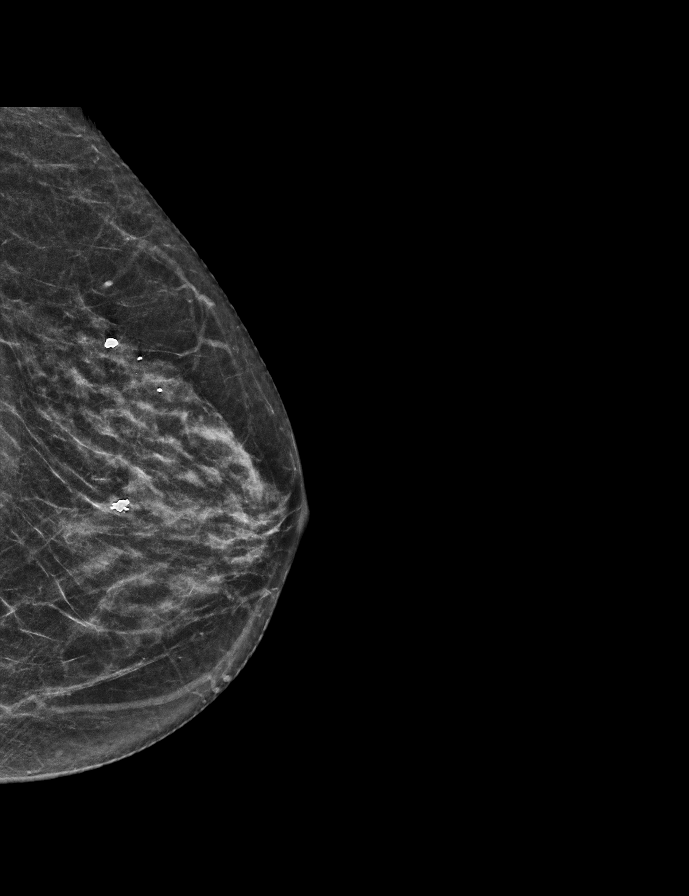

[R CC synth-2D]
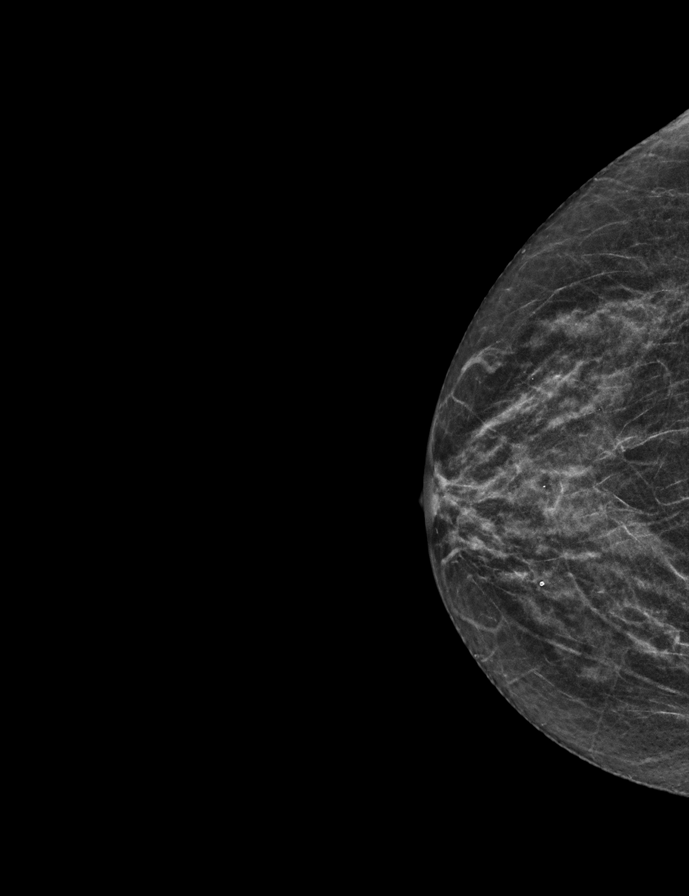

[R MLO synth-2D]
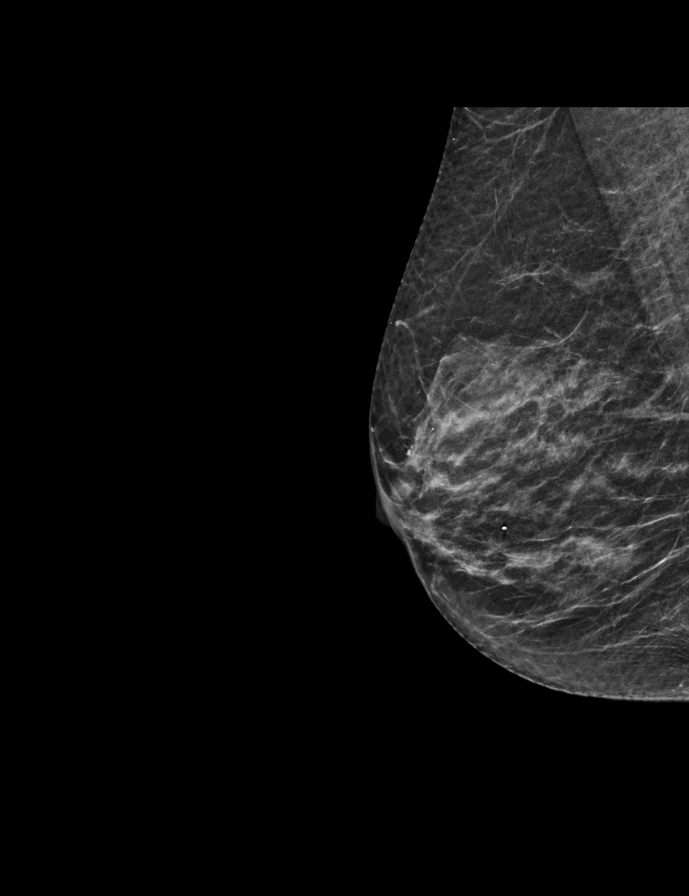

[L MLO synth-2D]
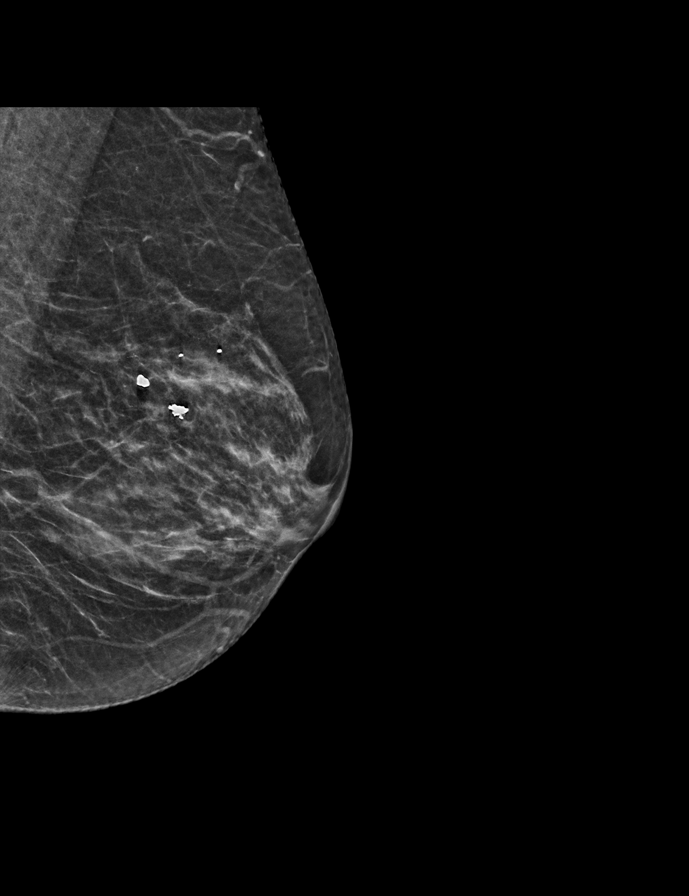

[L MLO tomo · 2 of 45 frames shown]
[frame 15/45]
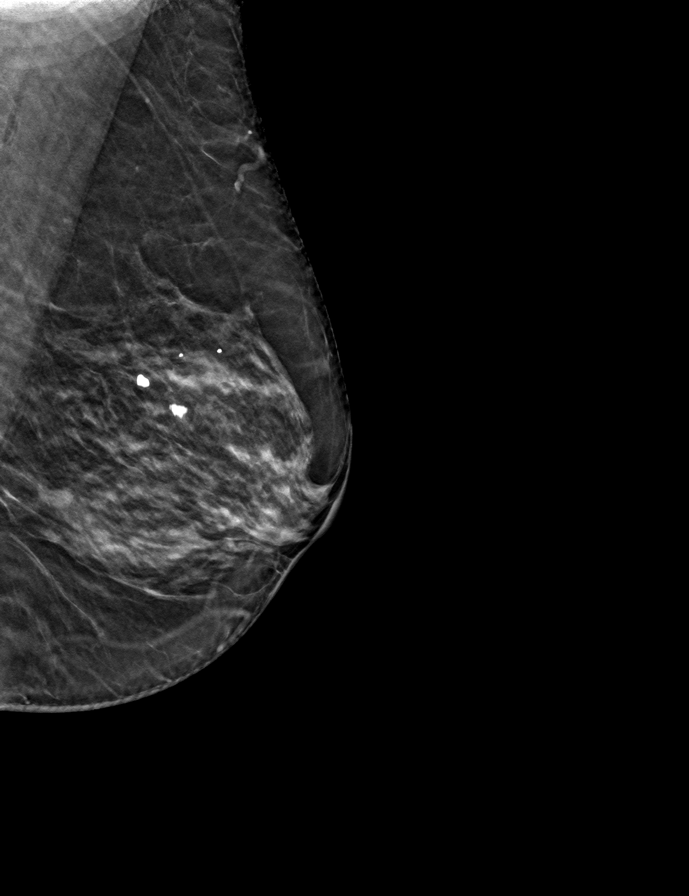
[frame 23/45]
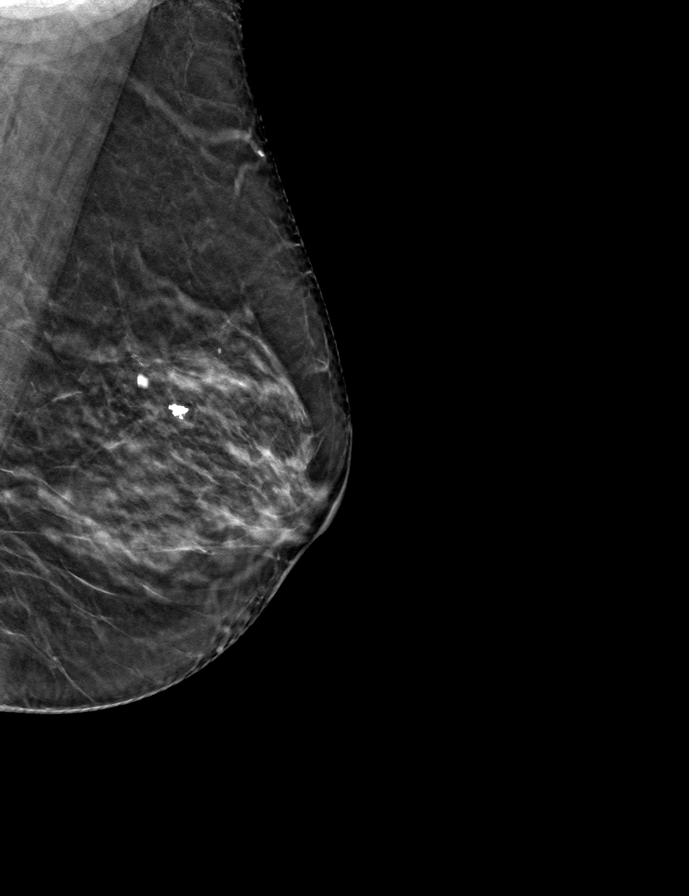

[R CC tomo · tomo slice 21/41.0]
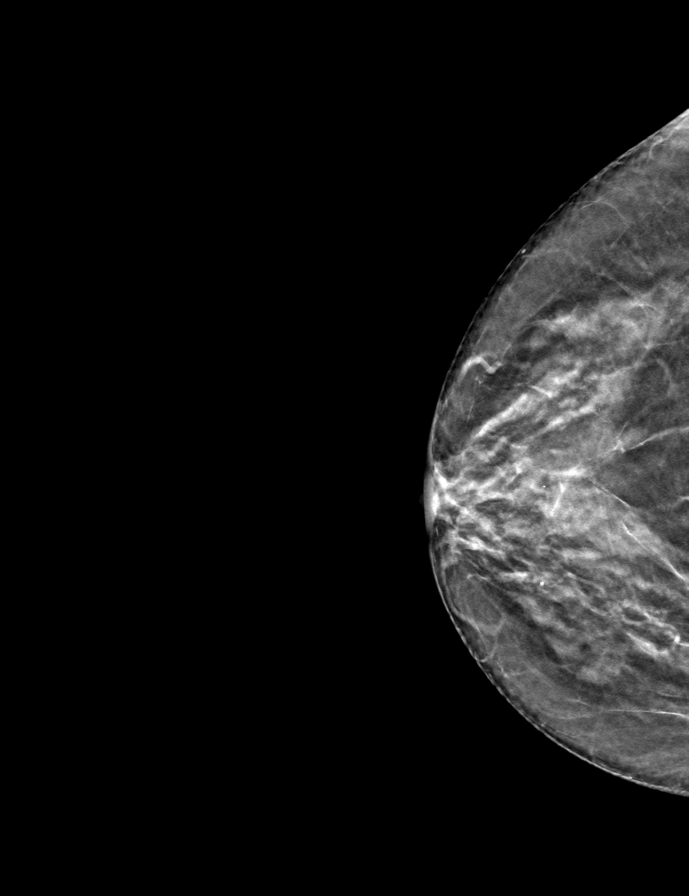

[L CC tomo · tomo slice 24/47.0]
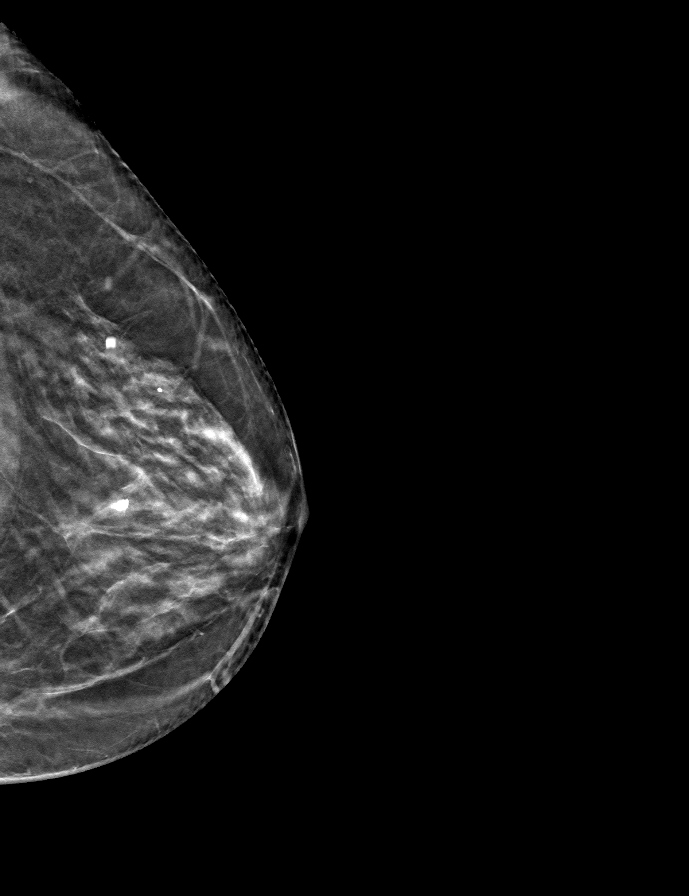

[R MLO tomo · tomo slice 21/42.0]
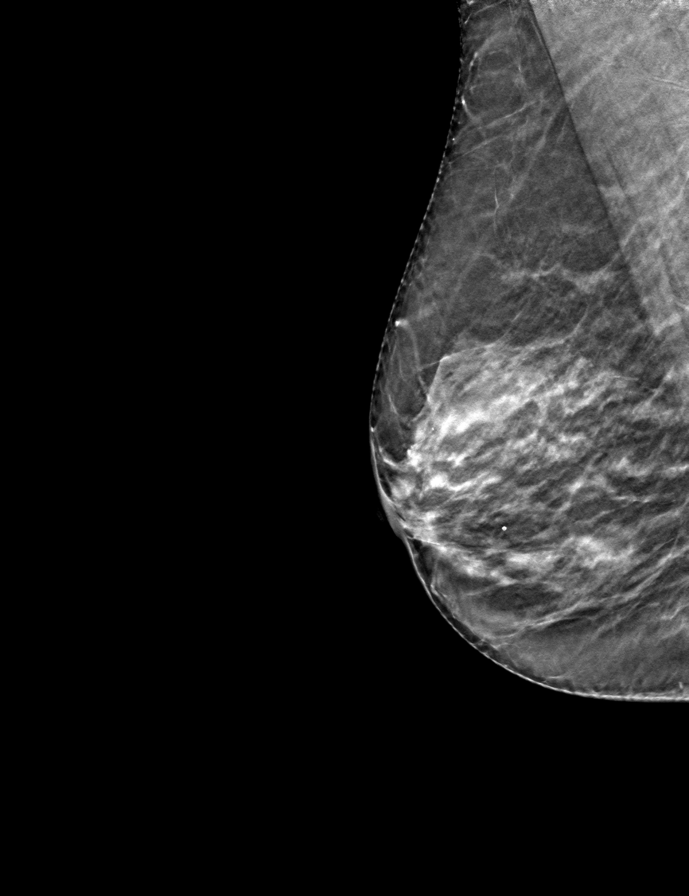

[9 of 24 positions shown; findings below may reference images not displayed]

ACR Breast Density Category c: The breast tissue is heterogeneously
dense, which may obscure small masses.
FINDINGS: In the right breast, a possible asymmetry warrants further
evaluation. In the left breast, no findings suspicious for
malignancy.
IMPRESSION: Further evaluation is suggested for possible asymmetry in the right
breast.

RECOMMENDATION:
Diagnostic mammogram and possibly ultrasound of the right breast.
(Code:[id number]-99A)

The patient will be contacted regarding the findings, and additional
imaging will be scheduled.

BI-RADS CATEGORY  0: Incomplete. Need additional imaging evaluation
and/or prior mammograms for comparison.

## 2023-09-04 NOTE — Telephone Encounter (Signed)
Submitted Patient Assistance Application to [employer] for [facility] along with provider portion, patient portion, PA, medication list, and insurance card copy. Will update patient when we receive a response.  Phone #: [phone] Fax #: [phone]

## 2023-09-06 NOTE — Telephone Encounter (Signed)
 Received fax from [employer] that patient is conditionally approved. Must submit form showing proof of grant foundation closure. There are no asthma grants currently open.  Faxed proof of foundation denials to [employer]  Phone #: [phone] Fax #: [phone]

## 2023-09-17 ENCOUNTER — Encounter: Payer: Self-pay | Admitting: Pulmonary Disease

## 2023-09-17 ENCOUNTER — Telehealth: Payer: Self-pay

## 2023-09-17 DIAGNOSIS — J4550 Severe persistent asthma, uncomplicated: Secondary | ICD-10-CM

## 2023-09-17 NOTE — Telephone Encounter (Signed)
 Left VM with Greig Ryder [phone]), nursing manager at [facility] regarding an RN There administering Fasenra.  Sherry Pennant, PharmD, MPH, BCPS, CPP Clinical Pharmacist (Rheumatology and Pulmonology)

## 2023-09-17 NOTE — Telephone Encounter (Signed)
 Copied from CRM #[id number]. Topic: Clinical - Medication Question >> Sep 17, 2023 12:11 PM Carrielelia G wrote: Patient Dorothy Church calling to ask if at her appointment, can she get an asthma injection? Medication is called: Fasenra.    Please advise

## 2023-09-17 NOTE — Telephone Encounter (Signed)
 Called patient and made her aware that we don't give asthma injections.

## 2023-09-19 NOTE — Telephone Encounter (Signed)
 Received call from Amy - she states LPN in charged of independent living facility is [facility] in [place] right now. She states that [place], CMA is covering. She states that they should surely be able to administer medication for patient  Left VM for Elijah [phone] - advised that patient already has medication at home and needs assistance with administering  Lynelle Weiler, PharmD, MPH, BCPS, CPP Clinical Pharmacist (Rheumatology and Pulmonology)

## 2023-09-23 NOTE — Telephone Encounter (Signed)
 Received return call from Elijah - no order is needed for Fasenra  from prescribing office. Patient must coordinate shipment of medication to home, but they will administer for patient  ATC Elijah - left VM requesting his team outreach to patient for first dose. Called patient and she is aware of plan. She will call me back directly if any issues coordinating  Sherry Pennant, PharmD, MPH, BCPS, CPP Clinical Pharmacist (Rheumatology and Pulmonology)

## 2023-09-24 ENCOUNTER — Ambulatory Visit: Admitting: Nurse Practitioner

## 2023-09-24 ENCOUNTER — Encounter: Payer: Self-pay | Admitting: Nurse Practitioner

## 2023-09-24 VITALS — BP 122/64 | HR 90 | Temp 96.3°F | Ht 66.0 in | Wt 154.2 lb

## 2023-09-24 DIAGNOSIS — J4540 Moderate persistent asthma, uncomplicated: Secondary | ICD-10-CM

## 2023-09-24 MED ORDER — FASENRA PEN 30 MG/ML ~~LOC~~ SOAJ: SUBCUTANEOUS | Status: DC

## 2023-09-24 MED ORDER — PREDNISONE 1 MG PO TABS: ORAL_TABLET | ORAL | 0 refills | Status: DC

## 2023-09-24 NOTE — Patient Instructions (Addendum)
  Titrate prednisone  to 5 mg by mouth every other day for 1 week then  4 mg every other day- for 1 week  3 mg every other day- for 1 week 2 mg every other day- for 1 week 1 mg every other day for 1 week 0.5 mg every other day  for 1 week then stop    Once you have come off prednisone  Decrease remeron  to half tablet for 2 weeks then stop.   Once you stop prednisone  and remeron  then decrease protonix  to once daily-- if able.  If still having a lot of acid reflux make appt with gastroenterologist.

## 2023-09-24 NOTE — Progress Notes (Signed)
 Careteam: Patient Care Team: Caro Harlene POUR, NP as PCP - General (Geriatric Medicine) Avram Barnie NOVAK, FNP (Nurse Practitioner) Tamea Dedra CROME, MD as Consulting Physician (Pulmonary Disease) PLACE OF SERVICE:  [facility]   Advanced Directive information Does Patient Have a Medical Advance Directive?: Yes, Type of Advance Directive: Healthcare Power of [place];Living will, Does patient want to make changes to medical advance directive?: No - Patient declined  Allergies  Allergen Reactions   Amoxicillin Other (See Comments)    Pencillins   Cefzil [Cefprozil] Other (See Comments)    Unknown   Cephalexin Other (See Comments)    Unknown   Propoxyphene Other (See Comments)    Unknown   Relafen [Nabumetone] Other (See Comments)    Unknown   Talwin [Pentazocine] Other (See Comments)    Unknown   Penicillins Other (See Comments)    Cannot remember reaction    Chief Complaint  Patient presents with   Medical Management of Chronic Issues    Medical Management of Chronic Issues. 3 Months follow up     HPI: Patient is a 88 y.o. female seen in today at [facility] for follow up.  Discussed the use of AI scribe software for clinical note transcription with the patient, who gave verbal consent to proceed.  History of Present Illness Dorothy Church is an 88 year old female for routine follow up.   She has ongoing breathing problems related to her asthma, characterized by frequent coughing with thick yellow phlegm. She has been on prednisone , currently tapering off with a dose of 10 mg every third day. She was told by pulmonary to stop taking but has been on 10 mg for months.  She has a history of indigestion and acid reflux, which has improved with Protonix  taken twice daily and occasional use of Tums at night. She has not seen her GI doctor recently but usually follows up with a nurse practitioner.  Her appetite has improved, and she has gained weight, currently  at 154 pounds, which she attributes to prednisone  use. She was previously underweight and has been on Remeron  to help with appetite and mood. Her mood has improved and is better than it was previously.  She is on levothyroxine  for thyroid  management, with levels checked in March  Zocor  for cholesterol, also checked in March.  Her bowel movements are better than before, aided by increased salad intake.     Review of Systems:  Review of Systems  Constitutional:  Negative for chills, fever and weight loss.  HENT:  Negative for tinnitus.   Respiratory:  Positive for cough and sputum production. Negative for shortness of breath.   Cardiovascular:  Negative for chest pain, palpitations and leg swelling.  Gastrointestinal:  Negative for abdominal pain, constipation, diarrhea and heartburn.  Genitourinary:  Negative for dysuria, frequency and urgency.  Musculoskeletal:  Negative for back pain, falls, joint pain and myalgias.  Skin: Negative.   Neurological:  Negative for dizziness and headaches.  Psychiatric/Behavioral:  Negative for depression and memory loss. The patient does not have insomnia.     Past Medical History:  Diagnosis Date   Abdominal pain    Abnormal CT of liver    Accident due to mechanical fall without injury 01/28/2022   Acute pain of left shoulder 09/28/2020   Anxiety    Anxiety 04/28/2020   Aortic stenosis    Aortic stenosis 03/04/2012   Overview:   Not confirmed by Dr Gollan.    Aortic  valve disorders    Arthritis    Asthma    Asthma 08/07/2012   Barrett's esophagus    [facility] last EGD 02/2018 Dr. Gaylyn    Barrett's esophagus 03/11/2017   Overview:  Dr Gaylyn   Carotid artery stenosis 12/30/2018   1-39% b/l carotids       Colon polyps    Colon polyps    COPD (chronic obstructive pulmonary disease) (HCC)    DDD (degenerative disc disease), cervical    DDD (degenerative disc disease), cervical 04/28/2020   DDD (degenerative disc disease), lumbar  01/22/2017   Depressive disorder    Diverticulosis    Erosive esophagitis    Erosive esophagitis 04/28/2020   Fundic gland polyps of stomach, benign    Gastric polyp    Gastric polyps    Gastritis    Generalized osteoarthritis of hand 05/03/2015   GERD (gastroesophageal reflux disease)    GOA (generalized osteoarthritis) 11/23/2015   Goiter    Hiatal hernia    small    Hiatal hernia    History of colonic polyps 12/08/2013   Hypercholesteremia    Hypothyroidism    Impaired fasting glucose    Impaired fasting glucose 03/04/2012   Overview:   Glucose 111 02/2012   Intrinsic asthma, unspecified    Joint pain of ankle and foot, right 05/17/2015   Left rib fracture    Mechanical complication of knee prosthesis, initial encounter (HCC) 08/25/2017   Obstructive sleep apnea (adult) (pediatric)    used cpap x 10 years per Dr. Isaiah off as of 12/30/18    OSA (obstructive sleep apnea)    used cpap x 10 years per Dr. 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   Stress 04/28/2020   Syncope and collapse    TIA (transient ischemic attack)    TIA (transient ischemic attack) 12/11/2016   TIA (transient ischemic attack) 12/11/2016   UTI (urinary tract infection)    Vitamin D  deficiency    Past Surgical History:  Procedure Laterality Date   BREAST EXCISIONAL BIOPSY Left    BREAST SURGERY     CATARACT EXTRACTION W/PHACO Right 11/07/2021   Procedure: CATARACT EXTRACTION PHACO AND INTRAOCULAR LENS PLACEMENT (IOC) RIGHT  8.87 00:50.8;  Surgeon: Jaye Fallow, MD;  Location: [facility];  Service: Ophthalmology;  Laterality: Right;   COLONOSCOPY     COLONOSCOPY WITH ESOPHAGOGASTRODUODENOSCOPY (EGD)      COLONOSCOPY WITH PROPOFOL  N/A 12/14/2014   Procedure: COLONOSCOPY WITH PROPOFOL ;  Surgeon: Gladis RAYMOND Gaylyn, MD;  Location: [facility]COPY;  Service: Endoscopy;  Laterality: N/A;   COLONOSCOPY WITH PROPOFOL  N/A 03/18/2018   Procedure: COLONOSCOPY WITH PROPOFOL ;  Surgeon: Gaylyn Gladis RAYMOND, MD;  Location: [facility] ENDOSCOPY;  Service: Endoscopy;  Laterality: N/A;   ENUCLEATION     ESOPHAGOGASTRODUODENOSCOPY N/A 08/30/2020   Procedure: ESOPHAGOGASTRODUODENOSCOPY (EGD);  Surgeon: Maryruth Ole DASEN, MD;  Location: [facility] ENDOSCOPY;  Service: Endoscopy;  Laterality: N/A;   ESOPHAGOGASTRODUODENOSCOPY (EGD) WITH PROPOFOL  N/A 12/14/2014   Procedure: ESOPHAGOGASTRODUODENOSCOPY (EGD) WITH PROPOFOL ;  Surgeon: Gladis RAYMOND Gaylyn, MD;  Location: [facility] ENDOSCOPY;  Service: Endoscopy;  Laterality: N/A;   ESOPHAGOGASTRODUODENOSCOPY (EGD) WITH PROPOFOL  N/A 10/18/2016   Procedure:  ESOPHAGOGASTRODUODENOSCOPY (EGD) WITH PROPOFOL ;  Surgeon: Gaylyn Gladis PENNER, MD;  Location: [facility] END[facility]COPY;  Service: Endoscopy;  Laterality: N/A;   ESOPHAGOGASTRODUODENOSCOPY (EGD) WITH PROPOFOL  N/A 03/18/2018   Procedure: ESOPHAGOGASTRODUODENOSCOPY (EGD) WITH PROPOFOL ;  Surgeon: Gaylyn Gladis PENNER, MD;  Location: [facility] ENDOSCOPY;  Service: Endoscopy;  Laterality: N/A;   ESOPHAGOGASTRODUODENOSCOPY (EGD) WITH PROPOFOL  N/A 12/19/2020   Procedure: ESOPHAGOGASTRODUODENOSCOPY (EGD) WITH PROPOFOL ;  Surgeon: Maryruth Ole DASEN, MD;  Location: [facility] ENDOSCOPY;  Service: Endoscopy;  Laterality: N/A;   EYE SURGERY     FRACTURE SURGERY     HIP SURGERY Left    pin and plate   IR ANGIO INTRA EXTRACRAN SEL COM CAROTID INNOMINATE BILAT MOD SED  10/17/2018   IR ANGIO VERTEBRAL SEL VERTEBRAL BILAT MOD SED  10/17/2018   IR ANGIOGRAM EXTREMITY LEFT  10/17/2018   JOINT REPLACEMENT     Left Total Knee Arthroplasty     Dr Mardee and also had 39 years ago from 2020    nasal polyps     removed x 2 affects sense of smell x 10-15 years from 04/28/20    prosthesis eye implant     REPLACEMENT UNICONDYLAR JOINT KNEE Left    TONSILLECTOMY     TOTAL KNEE REVISION Left 09/13/2017   Procedure: TOTAL KNEE REVISION;  Surgeon: Mardee Lynwood SQUIBB, MD;  Location: [facility] ORS;  Service: Orthopedics;  Laterality: Left;   Social History:   reports that she has never smoked. She has never used smokeless tobacco. She reports current alcohol  use of about 1.0 standard drink of alcohol  per week. She reports that she does not use drugs.  Family History  Problem Relation Age of Onset   Breast cancer Mother        pagets dx 45s   Heart disease Father    Alcohol  abuse Father    Dementia Sister        twin sister 5 dx'ed dementia   Bipolar disorder Brother     Medications: Patient's Medications  New Prescriptions   No medications on file  Previous Medications   ACETAMINOPHEN  (TYLENOL ) 500 MG TABLET    Take 1,000 mg by mouth every 8 (eight) hours as needed (PAIN).   ALBUTEROL  (VENTOLIN  HFA) 108 (90 BASE) MCG/ACT INHALER    Inhale 2 puffs into the lungs every 6 (six) hours as needed for wheezing.   ASPIRIN  EC 81 MG TABLET    Take 1 tablet (81 mg total) by mouth daily. Swallow whole.   BENRALIZUMAB  (FASENRA  PEN [place])    Inject 1 each into the skin every 30 (thirty) days. For 3 months and then every other month   BUDESON-GLYCOPYRROL-FORMOTEROL  (BREZTRI  AEROSPHERE) 160-9-4.8 MCG/ACT AERO    Inhale 2 puffs into the lungs in the morning and at bedtime.   CALCIUM  CARBONATE (TUMS - DOSED IN MG ELEMENTAL CALCIUM ) 500 MG CHEWABLE TABLET    Chew 1 tablet by mouth 4 (four) times daily as needed for indigestion or heartburn.   CHOLECALCIFEROL (VITAMIN D3) 50 MCG (2000 UT) CAPSULE    Take 1 capsule (2,000 Units total) by mouth daily.   ESCITALOPRAM  (LEXAPRO ) 10 MG TABLET    Take 1 tablet (10 mg total) by mouth daily.   LATANOPROST (XALATAN) 0.005 % OPHTHALMIC SOLUTION    Place 1 drop into the right eye at bedtime.   LEVOTHYROXINE  (SYNTHROID ) 50 MCG TABLET    TAKE 1 TABLET BY  MOUTH EVERY DAY BEFORE BREAKFAST   LORATADINE  (CLARITIN ) 10 MG TABLET    TAKE 1 TABLET  BY MOUTH EVERY DAY AS NEEDED FOR ALLERGY    MECLIZINE  (ANTIVERT ) 25 MG TABLET    Take 1 tablet (25 mg total) by mouth 3 (three) times daily as needed for dizziness.   MIRTAZAPINE  (REMERON ) 15 MG TABLET    Take 1 tablet (15 mg total) by mouth at bedtime.   PANTOPRAZOLE  (PROTONIX ) 40 MG TABLET    TAKE 1 TABLET BY MOUTH TWICE A DAY   PREDNISONE  (DELTASONE ) 10 MG TABLET    Take 1 tablet (10 mg total) by mouth daily with breakfast.   SIMVASTATIN  (ZOCOR ) 20 MG TABLET    TAKE 1 TABLET BY MOUTH EVERYDAY AT BEDTIME  Modified Medications   No medications on file  Discontinued Medications   No medications on file    Physical Exam:  Vitals:   09/24/23 1058  BP: 122/64  Pulse: 90  Temp: (!) 96.3 F (35.7 C)  SpO2: 95%  Weight: 154 lb 3.2 oz (69.9 kg)  Height: 5' 6 (1.676 m)   Body mass index is 24.89 kg/m. Wt Readings from Last 3 Encounters:  09/24/23 154 lb 3.2 oz (69.9 kg)  08/29/23 149 lb (67.6 kg)  06/28/23 148 lb (67.1 kg)    Physical Exam Constitutional:      General: She is not in acute distress.    Appearance: She is well-developed. She is not diaphoretic.  HENT:     Head: Normocephalic and atraumatic.     Mouth/Throat:     Pharynx: No oropharyngeal exudate.  Eyes:     Conjunctiva/sclera: Conjunctivae normal.     Pupils: Pupils are equal, round, and reactive to light.  Cardiovascular:     Rate and Rhythm: Normal rate and regular rhythm.     Heart sounds: Normal heart sounds.  Pulmonary:     Effort: Pulmonary effort is normal.     Breath sounds: Normal breath sounds.  Abdominal:     General: Bowel sounds are normal.     Palpations: Abdomen is soft.  Musculoskeletal:     Cervical back: Normal range of motion and neck supple.     Right lower leg: No edema.     Left lower leg: No edema.  Skin:    General: Skin is warm and dry.  Neurological:     Mental Status: She is alert.   Psychiatric:        Mood and Affect: Mood normal.     Labs reviewed: Basic Metabolic Panel: Recent Labs    05/16/23 2344  NA 139  K 3.6  CL 106  CO2 25  GLUCOSE 107*  BUN 10  CREATININE 0.72  CALCIUM  9.3  TSH 0.916   Liver Function Tests: Recent Labs    05/16/23 2344  AST 14*  ALT 10  ALKPHOS 39  BILITOT 0.6  PROT 6.2*  ALBUMIN  3.6   No results for input(s): LIPASE, AMYLASE in the last 8760 hours. No results for input(s): AMMONIA in the last 8760 hours. CBC: Recent Labs    12/12/22 1247 05/16/23 2344  WBC 5.2 7.1  NEUTROABS 3.5 5.1  HGB 13.7 12.6  HCT 40.9 37.0  MCV 92.7 88.5  PLT 216 215   Lipid Panel: Recent Labs    05/17/23 0428  CHOL 142  HDL 54  LDLCALC 75  TRIG 67  CHOLHDL 2.6   TSH: Recent Labs    05/16/23 2344  TSH 0.916   A1C: Lab Results  Component Value Date   HGBA1C 5.1 05/17/2023     Assessment/Plan Assessment and  Plan Assessment & Plan Asthma Chronic asthma with persistent symptoms. Followed by pulmonary   Pulmonologist advised prednisone  taper post-Fasenra  initiation. - per pulmonary Administer Fasenra  injections monthly for three months, then every other month. - Taper prednisone : 5 mg every other day for one week, then 4 mg every other day for one week, followed by 3 mg every other day for one week. 2 mg every other day for 1 week, 1 mg every other day  Gastroesophageal reflux disease (GERD) GERD symptoms improved with Protonix  -reduction considered post-prednisone  taper. - Continue Protonix  twice daily. - Follow up with gastroenterologist if symptoms persist and unable to taper Protonix .  Hypothyroidism Hypothyroidism well-controlled. Thyroid  levels normal in March. - Continue levothyroxine  as prescribed. - Recheck thyroid  levels yearly  Hyperlipidemia Hyperlipidemia well-managed with Zocor . Cholesterol levels stable. - Continue Zocor  as prescribed. - Recheck cholesterol levels  annually.  Depression Depression improved with Remeron . Considering tapering to assess mood and appetite stability without medication. - Decrease Remeron  to half a tablet for two weeks, then discontinue. - Monitor mood and appetite closely after discontinuation.    Follow up in 3 months.  Destyne Goodreau K. Caro BODILY  [facility] [phone]

## 2023-10-27 ENCOUNTER — Encounter: Payer: Self-pay | Admitting: Pulmonary Disease

## 2023-10-28 NOTE — Telephone Encounter (Signed)
 These medications can take anywhere between 6-8 shots to notice any improvement.  She needs to at minimum take 6 of the shots before calling it therapeutic failure.

## 2023-11-12 DIAGNOSIS — H26491 Other secondary cataract, right eye: Secondary | ICD-10-CM | POA: Diagnosis not present

## 2023-11-12 DIAGNOSIS — H43811 Vitreous degeneration, right eye: Secondary | ICD-10-CM | POA: Diagnosis not present

## 2023-11-12 DIAGNOSIS — H43391 Other vitreous opacities, right eye: Secondary | ICD-10-CM | POA: Diagnosis not present

## 2023-11-12 DIAGNOSIS — H401110 Primary open-angle glaucoma, right eye, stage unspecified: Secondary | ICD-10-CM | POA: Diagnosis not present

## 2023-11-16 ENCOUNTER — Ambulatory Visit
Admission: EM | Admit: 2023-11-16 | Discharge: 2023-11-16 | Disposition: A | Discharge status: Home / Self Care | Attending: Emergency Medicine | Admitting: Emergency Medicine

## 2023-11-16 DIAGNOSIS — R300 Dysuria: Secondary | ICD-10-CM | POA: Diagnosis not present

## 2023-11-16 LAB — POCT URINE DIPSTICK
Glucose, UA: NEGATIVE mg/dL
Nitrite, UA: NEGATIVE
Protein Ur, POC: 30 mg/dL — AB
Spec Grav, UA: 1.025 (ref 1.010–1.025)
Urobilinogen, UA: 1 U/dL
pH, UA: 6 (ref 5.0–8.0)

## 2023-11-16 MED ORDER — NITROFURANTOIN MONOHYD MACRO 100 MG PO CAPS: 100.0000 mg | ORAL_CAPSULE | Freq: Two times a day (BID) | ORAL | 0 refills | Status: DC

## 2023-11-16 NOTE — ED Provider Notes (Signed)
 [facility]    CSN: [id number] Arrival date & time: 11/16/23  1500      History   Chief Complaint Chief Complaint  Patient presents with   Dysuria   Urinary Frequency    HPI Dorothy Church is a 88 y.o. female.   Patient presents for evaluation of urinary frequency, urgency and dysuria beginning 3 days ago.  Has not attempted treatment.  Denies abdominal or flank pain, fever, hematuria or vaginal symptoms.  Past Medical History:  Diagnosis Date   Abdominal pain    Abnormal CT of liver    Accident due to mechanical fall without injury 01/28/2022   Acute pain of left shoulder 09/28/2020   Anxiety    Anxiety 04/28/2020   Aortic stenosis    Aortic stenosis 03/04/2012   Overview:   Not confirmed by Dr Gollan.    Aortic valve disorders    Arthritis    Asthma    Asthma 08/07/2012   Barrett's esophagus    [facility] last EGD 02/2018 Dr. Gaylyn    Barrett's esophagus 03/11/2017   Overview:  Dr Gaylyn   Carotid artery stenosis 12/30/2018   1-39% b/l carotids       Colon polyps    Colon polyps    COPD (chronic obstructive pulmonary disease) (HCC)    DDD (degenerative disc disease), cervical    DDD (degenerative disc disease), cervical 04/28/2020   DDD (degenerative disc disease), lumbar 01/22/2017   Depressive disorder    Diverticulosis    Erosive esophagitis    Erosive esophagitis 04/28/2020   Fundic gland polyps of stomach, benign    Gastric polyp    Gastric polyps    Gastritis    Generalized osteoarthritis of hand 05/03/2015   GERD (gastroesophageal reflux disease)    GOA (generalized osteoarthritis) 11/23/2015   Goiter    Hiatal hernia    small    Hiatal hernia    History of colonic polyps 12/08/2013   Hypercholesteremia    Hypothyroidism    Impaired fasting glucose    Impaired fasting glucose 03/04/2012   Overview:   Glucose 111 02/2012   Intrinsic asthma, unspecified    Joint pain of ankle and foot, right 05/17/2015   Left rib fracture     Mechanical complication of knee prosthesis, initial encounter 08/25/2017   Obstructive sleep apnea (adult) (pediatric)    used cpap x 10 years per Dr. Isaiah off as of 12/30/18    OSA (obstructive sleep apnea)    used cpap x 10 years per Dr. 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   Stress 04/28/2020   Syncope and collapse    TIA (transient ischemic attack)    TIA (transient ischemic attack) 12/11/2016   TIA (transient ischemic attack) 12/11/2016   UTI (urinary tract infection)    Vitamin D  deficiency     Patient Active Problem List   Diagnosis Date Noted   Chronic idiopathic constipation 06/20/2023   Moderate episode of recurrent major depressive disorder (HCC) 06/20/2023   Moderate persistent asthma without complication 06/20/2023   Orthostatic hypotension 05/18/2023   BPPV (benign paroxysmal positional vertigo) 03/13/2022   Allergic rhinitis 03/10/2021  Gastroesophageal reflux disease 09/28/2020   Gastric polyp 04/28/2020   Lower esophageal ring (Schatzki) 04/28/2020   Insomnia 12/30/2018   Hiatal hernia    Mood disorder 03/11/2017   Asthma, chronic, severe persistent, uncomplicated 05/11/2016   OA (osteoarthritis) 04/04/2015   Osteoporosis 10/07/2012   Vitamin D  deficiency    Hypothyroidism    Hyperlipidemia 08/07/2012    Past Surgical History:  Procedure Laterality Date   BREAST EXCISIONAL BIOPSY Left    BREAST SURGERY     CATARACT EXTRACTION W/PHACO Right 11/07/2021   Procedure: CATARACT EXTRACTION PHACO AND INTRAOCULAR LENS PLACEMENT (IOC) RIGHT  8.87 00:50.8;  Surgeon: Jaye Fallow, MD;  Location: [facility];  Service: Ophthalmology;  Laterality: Right;    COLONOSCOPY     COLONOSCOPY WITH ESOPHAGOGASTRODUODENOSCOPY (EGD)     COLONOSCOPY WITH PROPOFOL  N/A 12/14/2014   Procedure: COLONOSCOPY WITH PROPOFOL ;  Surgeon: Gladis RAYMOND Mariner, MD;  Location: [facility] ENDOSCOPY;  Service: Endoscopy;  Laterality: N/A;   COLONOSCOPY WITH PROPOFOL  N/A 03/18/2018   Procedure: COLONOSCOPY WITH PROPOFOL ;  Surgeon: Mariner Gladis RAYMOND, MD;  Location: [facility] ENDOSCOPY;  Service: Endoscopy;  Laterality: N/A;   ENUCLEATION     ESOPHAGOGASTRODUODENOSCOPY N/A 08/30/2020   Procedure: ESOPHAGOGASTRODUODENOSCOPY (EGD);  Surgeon: Maryruth Ole DASEN, MD;  Location: [facility] ENDOSCOPY;  Service: Endoscopy;  Laterality: N/A;   ESOPHAGOGASTRODUODENOSCOPY (EGD) WITH PROPOFOL  N/A 12/14/2014   Procedure: ESOPHAGOGASTRODUODENOSCOPY (EGD) WITH PROPOFOL ;  Surgeon: Gladis RAYMOND Mariner, MD;  Location: [facility] ENDOSCOPY;  Service: Endoscopy;  Laterality: N/A;   ESOPHAGOGASTRODUODENOSCOPY (EGD) WITH PROPOFOL  N/A 10/18/2016   Procedure: ESOPHAGOGASTRODUODENOSCOPY (EGD) WITH PROPOFOL ;  Surgeon: Mariner Gladis RAYMOND, MD;  Location: [facility] ENDOSCOPY;  Service: Endoscopy;  Laterality: N/A;   ESOPHAGOGASTRODUODENOSCOPY (EGD) WITH PROPOFOL  N/A 03/18/2018   Procedure: ESOPHAGOGASTRODUODENOSCOPY (EGD) WITH PROPOFOL ;  Surgeon: Mariner Gladis RAYMOND, MD;  Location: [facility] ENDOSCOPY;  Service: Endoscopy;  Laterality: N/A;   ESOPHAGOGASTRODUODENOSCOPY (EGD) WITH PROPOFOL  N/A 12/19/2020   Procedure: ESOPHAGOGASTRODUODENOSCOPY (EGD) WITH PROPOFOL ;  Surgeon: Maryruth Ole DASEN, MD;  Location: [facility] ENDOSCOPY;  Service: Endoscopy;  Laterality: N/A;   EYE SURGERY     FRACTURE SURGERY     HIP SURGERY Left    pin and plate   IR ANGIO INTRA EXTRACRAN SEL COM CAROTID INNOMINATE BILAT MOD SED  10/17/2018   IR ANGIO VERTEBRAL SEL VERTEBRAL BILAT MOD SED  10/17/2018   IR ANGIOGRAM EXTREMITY LEFT  10/17/2018   JOINT REPLACEMENT     Left Total Knee Arthroplasty     Dr Mardee and also had 39 years ago from 2020    nasal polyps      removed x 2 affects sense of smell x 10-15 years from 04/28/20   prosthesis eye implant     REPLACEMENT UNICONDYLAR JOINT KNEE Left    TONSILLECTOMY     TOTAL KNEE REVISION Left 09/13/2017   Procedure: TOTAL KNEE REVISION;  Surgeon: Mardee Lynwood SQUIBB, MD;  Location: [facility] ORS;  Service: Orthopedics;  Laterality: Left;    OB History   No obstetric history on file.      Home Medications    Prior to Admission medications   Medication Sig Start Date End Date Taking? Authorizing Provider  acetaminophen  (TYLENOL ) 500 MG tablet Take 1,000 mg by mouth every 8 (eight) hours as needed (PAIN).   Yes [provider]  albuterol  (VENTOLIN  HFA) 108 (90 Base) MCG/ACT inhaler Inhale 2 puffs into the lungs every 6 (six) hours as needed for wheezing. 04/04/23  Yes Tamea Dedra CROME, MD  aspirin  EC 81 MG  tablet Take 1 tablet (81 mg total) by mouth daily. Swallow whole. 10/17/21  Yes Hope Merle, MD  benralizumab  (FASENRA  PEN) 30 MG/ML prefilled autoinjector Inject 30mg  into the skin at Week 0, Week 4, Week 8 then every 8 weeks 09/24/23  Yes Tamea Dedra CROME, MD  Budeson-Glycopyrrol-Formoterol  (BREZTRI  AEROSPHERE) 160-9-4.8 MCG/ACT AERO Inhale 2 puffs into the lungs in the morning and at bedtime. 04/11/23  Yes Tamea Dedra CROME, MD  calcium  carbonate (TUMS - DOSED IN MG ELEMENTAL CALCIUM ) 500 MG chewable tablet Chew 1 tablet by mouth 4 (four) times daily as needed for indigestion or heartburn.   Yes [provider]  Cholecalciferol (VITAMIN D3) 50 MCG (2000 UT) capsule Take 1 capsule (2,000 Units total) by mouth daily. 10/17/21  Yes Hope Merle, MD  escitalopram  (LEXAPRO ) 10 MG tablet Take 1 tablet (10 mg total) by mouth daily. 06/20/23  Yes Eubanks, Jessica K, NP  latanoprost (XALATAN) 0.005 % ophthalmic solution Place 1 drop into the right eye at bedtime.   Yes [provider]  levothyroxine  (SYNTHROID ) 50 MCG tablet TAKE 1 TABLET BY MOUTH EVERY DAY BEFORE BREAKFAST 07/29/23  Yes Eubanks,  Jessica K, NP  loratadine  (CLARITIN ) 10 MG tablet TAKE 1 TABLET BY MOUTH EVERY DAY AS NEEDED FOR ALLERGY  03/08/23  Yes Kasa, Nickolas, MD  mirtazapine  (REMERON ) 15 MG tablet Take 1 tablet (15 mg total) by mouth at bedtime. 07/29/23  Yes Caro Harlene POUR, NP  nitrofurantoin , macrocrystal-monohydrate, (MACROBID ) 100 MG capsule Take 1 capsule (100 mg total) by mouth 2 (two) times daily. 11/16/23  Yes Ajene Carchi R, NP  pantoprazole  (PROTONIX ) 40 MG tablet TAKE 1 TABLET BY MOUTH TWICE A DAY 06/24/23  Yes Eubanks, Jessica K, NP  simvastatin  (ZOCOR ) 20 MG tablet TAKE 1 TABLET BY MOUTH EVERYDAY AT BEDTIME 05/01/23  Yes Hope Merle, MD  meclizine  (ANTIVERT ) 25 MG tablet Take 1 tablet (25 mg total) by mouth 3 (three) times daily as needed for dizziness. 05/19/23   Darci Pore, MD  predniSONE  (DELTASONE ) 1 MG tablet As directed 09/24/23   Caro Harlene POUR, NP    Family History Family History  Problem Relation Age of Onset   Breast cancer Mother        pagets dx 52s   Heart disease Father    Alcohol  abuse Father    Dementia Sister        twin sister 35 dx'ed dementia   Bipolar disorder Brother     Social History Social History   Tobacco Use   Smoking status: Never   Smokeless tobacco: Never  Vaping Use   Vaping status: Never Used  Substance Use Topics   Alcohol  use: Yes    Alcohol /week: 1.0 standard drink of alcohol     Types: 1 Glasses of wine per week    Comment: occas. wine   Drug use: No     Allergies   Amoxicillin, Cefzil [cefprozil], Cephalexin, Propoxyphene, Relafen [nabumetone], Talwin [pentazocine], and Penicillins   Review of Systems Review of Systems   Physical Exam Triage Vital Signs ED Triage Vitals  Encounter Vitals Group     BP 11/16/23 1514 114/75     Girls Systolic BP Percentile --      Girls Diastolic BP Percentile --      Boys Systolic BP Percentile --      Boys Diastolic BP Percentile --      Pulse Rate 11/16/23 1514 89     Resp 11/16/23 1514 17      Temp 11/16/23  1514 98.7 F (37.1 C)     Temp Source 11/16/23 1514 Oral     SpO2 11/16/23 1514 96 %     Weight --      Height --      Head Circumference --      Peak Flow --      Pain Score 11/16/23 1513 4     Pain Loc --      Pain Education --      Exclude from Growth Chart --    No data found.  Updated Vital Signs BP 114/75 (BP Location: Left Arm)   Pulse 89   Temp 98.7 F (37.1 C) (Oral)   Resp 17   SpO2 96%   Visual Acuity Right Eye Distance:   Left Eye Distance:   Bilateral Distance:    Right Eye Near:   Left Eye Near:    Bilateral Near:     Physical Exam Constitutional:      Appearance: Normal appearance.  Eyes:     Extraocular Movements: Extraocular movements intact.  Pulmonary:     Effort: Pulmonary effort is normal.  Abdominal:     Tenderness: There is no abdominal tenderness. There is no right CVA tenderness, left CVA tenderness or guarding.  Neurological:     Mental Status: She is alert and oriented to person, place, and time. Mental status is at baseline.      UC Treatments / Results  Labs (all labs ordered are listed, but only abnormal results are displayed) Labs Reviewed  POCT URINE DIPSTICK - Abnormal; Notable for the following components:      Result Value   Clarity, UA cloudy (*)    Bilirubin, UA small (*)    Ketones, POC UA trace (5) (*)    Blood, UA moderate (*)    Protein Ur, POC =30 (*)    Leukocytes, UA Trace (*)    All other components within normal limits  URINE CULTURE    EKG   Radiology No results found.  Procedures Procedures (including critical care time)  Medications Ordered in UC Medications - No data to display  Initial Impression / Assessment and Plan / UC Course  I have reviewed the triage vital signs and the nursing notes.  Pertinent labs & imaging results that were available during my care of the patient were reviewed by me and considered in my medical decision making (see chart for  details).  Dysuria  Urinalysis showed leukocytes, negative for nitrates, sent for culture, empirically placed on Macrobid  as she is symptomatic, per chart review last urine culture available showing E. coli, recommended over-the-counter medications and nonpharmacological supportive care with follow-up if symptoms persist worsen or recur Final Clinical Impressions(s) / UC Diagnoses   Final diagnoses:  Dysuria     Discharge Instructions      Your urinalysis shows Maycel Riffe blood cells but does not show bacteria, your urine will be sent to the lab to determine exactly which bacteria is present, if any changes need to be made to your medications you will be notified  Begin use of Macrobid  twice daily for 5 days  You may use over-the-counter Azo to help minimize your symptoms until antibiotic removes bacteria, this medication will turn your urine orange  Increase your fluid intake through use of water  As always practice good hygiene, wiping front to back and avoidance of scented vaginal products to prevent further irritation  If symptoms continue to persist after use of medication or recur please follow-up  with urgent care or your primary doctor as needed    ED Prescriptions     Medication Sig Dispense Auth. Provider   nitrofurantoin , macrocrystal-monohydrate, (MACROBID ) 100 MG capsule Take 1 capsule (100 mg total) by mouth 2 (two) times daily. 10 capsule Bijon Mineer R, NP      PDMP not reviewed this encounter.   Teresa Shelba SAUNDERS, NP 11/16/23 1525

## 2023-11-16 NOTE — Discharge Instructions (Addendum)
 Your urinalysis shows Dorothy Church blood cells but does not show bacteria, your urine will be sent to the lab to determine exactly which bacteria is present, if any changes need to be made to your medications you will be notified  Begin use of Macrobid  twice daily for 5 days  You may use over-the-counter Azo to help minimize your symptoms until antibiotic removes bacteria, this medication will turn your urine orange  Increase your fluid intake through use of water  As always practice good hygiene, wiping front to back and avoidance of scented vaginal products to prevent further irritation  If symptoms continue to persist after use of medication or recur please follow-up with urgent care or your primary doctor as needed

## 2023-11-16 NOTE — ED Triage Notes (Signed)
 Patient state she's had sx x 3 days  Dysuria and urinary frequency

## 2023-11-18 ENCOUNTER — Ambulatory Visit (HOSPITAL_COMMUNITY): Payer: Self-pay

## 2023-11-18 LAB — URINE CULTURE: Culture: >=100000 — AB

## 2023-11-23 ENCOUNTER — Encounter: Payer: Self-pay | Admitting: Nurse Practitioner

## 2023-11-27 ENCOUNTER — Ambulatory Visit (INDEPENDENT_AMBULATORY_CARE_PROVIDER_SITE_OTHER)

## 2023-11-27 VITALS — BP 110/70 | HR 92 | Temp 99.3°F | Ht 66.0 in | Wt 150.8 lb

## 2023-11-27 DIAGNOSIS — I6381 Other cerebral infarction due to occlusion or stenosis of small artery: Secondary | ICD-10-CM | POA: Insufficient documentation

## 2023-11-27 DIAGNOSIS — G4700 Insomnia, unspecified: Secondary | ICD-10-CM | POA: Diagnosis not present

## 2023-11-27 DIAGNOSIS — K219 Gastro-esophageal reflux disease without esophagitis: Secondary | ICD-10-CM

## 2023-11-27 DIAGNOSIS — K5904 Chronic idiopathic constipation: Secondary | ICD-10-CM | POA: Diagnosis not present

## 2023-11-27 DIAGNOSIS — E039 Hypothyroidism, unspecified: Secondary | ICD-10-CM

## 2023-11-27 DIAGNOSIS — M810 Age-related osteoporosis without current pathological fracture: Secondary | ICD-10-CM

## 2023-11-27 DIAGNOSIS — H8110 Benign paroxysmal vertigo, unspecified ear: Secondary | ICD-10-CM | POA: Diagnosis not present

## 2023-11-27 DIAGNOSIS — J4550 Severe persistent asthma, uncomplicated: Secondary | ICD-10-CM | POA: Diagnosis not present

## 2023-11-27 DIAGNOSIS — F39 Unspecified mood [affective] disorder: Secondary | ICD-10-CM

## 2023-11-27 DIAGNOSIS — E782 Mixed hyperlipidemia: Secondary | ICD-10-CM | POA: Diagnosis not present

## 2023-11-27 DIAGNOSIS — F339 Major depressive disorder, recurrent, unspecified: Secondary | ICD-10-CM

## 2023-11-27 DIAGNOSIS — R7301 Impaired fasting glucose: Secondary | ICD-10-CM

## 2023-11-27 MED ORDER — MELATONIN 1 MG PO CAPS: 1.0000 mg | ORAL_CAPSULE | Freq: Every evening | ORAL | 3 refills | Status: DC

## 2023-11-27 MED ORDER — VITAMIN D3 50 MCG (2000 UT) PO CAPS: 2000.0000 [IU] | ORAL_CAPSULE | Freq: Every day | ORAL | 3 refills | Status: AC

## 2023-11-27 MED ORDER — SIMVASTATIN 20 MG PO TABS: ORAL_TABLET | ORAL | 3 refills | Status: DC

## 2023-11-27 MED ORDER — ASPIRIN 81 MG PO TBEC: 81.0000 mg | DELAYED_RELEASE_TABLET | Freq: Every day | ORAL | 3 refills | Status: AC

## 2023-11-27 MED ORDER — DOCUSATE SODIUM 100 MG PO CAPS: 100.0000 mg | ORAL_CAPSULE | Freq: Every day | ORAL | 1 refills | Status: AC | PRN

## 2023-11-27 NOTE — Assessment & Plan Note (Addendum)
 Noted on CT and MRI head 05/17/23:  Small age-indeterminate lacunar infarct in the left basal Ganglia. On Aspirin  81 mg and Simvastatin  20 mg daily.

## 2023-11-27 NOTE — Assessment & Plan Note (Signed)
 Patient has a long h/o constipation. She is working on increasing daily water intake.  Recommend adding Docusate 100 mg prn on top of following measures, also provided with high fiber diet information: Try to drink plenty of water throughout the day. Aim for 6-8 glasses if you can, unless you have been told to limit fluids for another health reason. Eat More Fiber: Goal fiber is about 25 grams per day.  Include more fruits, vegetables, whole grains, and beans in your meals. Foods like prunes, pears, and bran cereals are especially helpful. Keep Moving: Gentle activity, such as walking or stretching, can help keep your bowels regular. Set a Routine: Try to use the bathroom at the same time each day, especially after meals. Don't rush, and don't ignore the urge to go.

## 2023-11-27 NOTE — Progress Notes (Signed)
 Established Patient Office Visit   Subjective  Patient ID: Dorothy Church, female    DOB: 02/10/1936  Age: 88 y.o. MRN: [id number]  Chief Complaint  Patient presents with   Establish Care    She  has a past medical history of Abdominal pain, Abnormal CT of liver, Accident due to mechanical fall without injury (01/28/2022), Acute pain of left shoulder (09/28/2020), Anxiety, Anxiety (04/28/2020), Aortic stenosis, Aortic stenosis (03/04/2012), Aortic valve disorders, Arthritis, Asthma, Asthma (08/07/2012), Barrett's esophagus, Barrett's esophagus (03/11/2017), Carotid artery stenosis (12/30/2018), Colon polyps, Colon polyps, COPD (chronic obstructive pulmonary disease) (HCC), DDD (degenerative disc disease), cervical, DDD (degenerative disc disease), cervical (04/28/2020), DDD (degenerative disc disease), lumbar (01/22/2017), Depressive disorder, Diverticulosis, Erosive esophagitis, Erosive esophagitis (04/28/2020), Fundic gland polyps of stomach, benign, Gastric polyp, Gastric polyps, Gastritis, Generalized osteoarthritis of hand (05/03/2015), GERD (gastroesophageal reflux disease), GOA (generalized osteoarthritis) (11/23/2015), Goiter, Hiatal hernia, History of colonic polyps (12/08/2013), Hypercholesteremia, Hypothyroidism, Impaired fasting glucose, Impaired fasting glucose (03/04/2012), Intrinsic asthma, unspecified, Joint pain of ankle and foot, right (05/17/2015), Left rib fracture, Mechanical complication of knee prosthesis, initial encounter (08/25/2017), Obstructive sleep apnea (adult) (pediatric), Orthostatic hypotension (05/18/2023), OSA (obstructive sleep apnea), Osteopenia (05/11/2019), Persistent cough (12/30/2018), Pneumonia, Pure hypercholesterolemia (03/11/2017), Right shoulder pain (05/11/2019), Schatzki's ring, Schatzki's ring, Shingles, Status post revision of total knee replacement, 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  HPI Discussed the use of AI scribe software for clinical note transcription with the patient, who gave verbal consent to proceed.  History of Present Illness CHAUNTELLE Church is an 88 year old female with chronic asthma and acid reflux who presents for transfer of care from previous PCP.   She has chronic asthma with primary symptoms of shortness of breath, particularly when walking or being active, with improvement when sitting. She recently completed a prednisone  taper and is currently on Fasenra  injections, having completed the initial three monthly doses. She experiences better symptom control in the afternoon and evening compared to mornings.  She also has acid reflux, for which she takes pantoprazole  twice daily and Tums at night, especially after using her inhaler before bed, which she feels aggravates her reflux. She has been on pantoprazole  for some time and finds Tums helpful in managing nighttime symptoms.  She experiences difficulty sleeping and has been on Lexapro  for three to four months without noticing improvement in her jitteriness, anxiety, or sleep issues. She takes mirtazapine  at bedtime and has tried melatonin in the past. Her sleep issues have persisted since the passing of her twin sister nearly a year ago, which was a significant emotional adjustment.  She reports constipation and has tried Miralax in the past without success. She has not recently used stool softeners. She is actively trying to drink more water using flavored additives.  Her past medical history includes a lacunar infarct noted in imaging in March. She has a history of balance problems and was hospitalized for this, followed by a 10-day rehabilitation stay. She does not currently engage in physical therapy for balance but remains active through community and church involvement.  She has a  history of osteoporosis and receives Reclast  yearly. She takes calcium  600 mg twice daily and vitamin D  2000 units daily. She has had two knee replacements on her left knee, which limits her ability to garden, but she remains active in other ways, such as participating in organizations and church  activities.  She has a history of hypothyroidism but has stopped taking levothyroxine  due to difficulty timing the medication around meals and other medications.  She has a history of urinary tract infections, with the most recent episode resolved. She takes aspirin  daily and Tylenol  as needed.  ROS As per HPI    Objective:     BP 110/70 (BP Location: Right Arm, Patient Position: Sitting, Cuff Size: Normal)   Pulse 92   Temp 99.3 F (37.4 C) (Oral)   Ht 5' 6 (1.676 m)   Wt 150 lb 12.8 oz (68.4 kg)   SpO2 97%   BMI 24.34 kg/m      11/27/2023    2:08 PM 09/24/2023   11:03 AM 06/27/2023   10:44 AM  Depression screen PHQ 2/9  Decreased Interest 1 0 0  Down, Depressed, Hopeless 0 0 0  PHQ - 2 Score 1 0 0  Altered sleeping 3    Tired, decreased energy 1    Change in appetite 0    Feeling bad or failure about yourself  0    Trouble concentrating 0    Moving slowly or fidgety/restless 0    Suicidal thoughts 0    PHQ-9 Score 5    Difficult doing work/chores Somewhat difficult        11/27/2023    2:08 PM 10/08/2022    2:51 PM 09/10/2022    1:06 PM 03/12/2022   11:45 AM  GAD 7 : Generalized Anxiety Score  Nervous, Anxious, on Edge 2 1 2  0  Control/stop worrying 2 0 1 0  Worry too much - different things 1 0 1 0  Trouble relaxing 2 2 2  0  Restless 1 2 2  0  Easily annoyed or irritable 1 1 0 0  Afraid - awful might happen 1 1 0 0  Total GAD 7 Score 10 7 8  0  Anxiety Difficulty Somewhat difficult Somewhat difficult Somewhat difficult Not difficult at all      11/27/2023    2:08 PM 09/24/2023   11:03 AM 06/27/2023   10:44 AM  Depression screen PHQ 2/9  Decreased Interest 1 0 0  Down,  Depressed, Hopeless 0 0 0  PHQ - 2 Score 1 0 0  Altered sleeping 3    Tired, decreased energy 1    Change in appetite 0    Feeling bad or failure about yourself  0    Trouble concentrating 0    Moving slowly or fidgety/restless 0    Suicidal thoughts 0    PHQ-9 Score 5    Difficult doing work/chores Somewhat difficult        11/27/2023    2:08 PM 10/08/2022    2:51 PM 09/10/2022    1:06 PM 03/12/2022   11:45 AM  GAD 7 : Generalized Anxiety Score  Nervous, Anxious, on Edge 2 1 2  0  Control/stop worrying 2 0 1 0  Worry too much - different things 1 0 1 0  Trouble relaxing 2 2 2  0  Restless 1 2 2  0  Easily annoyed or irritable 1 1 0 0  Afraid - awful might happen 1 1 0 0  Total GAD 7 Score 10 7 8  0  Anxiety Difficulty Somewhat difficult Somewhat difficult Somewhat difficult Not difficult at all   SDOH Screenings   Food Insecurity: No Food Insecurity (09/20/2023)  Housing: Low Risk  (09/20/2023)  Transportation Needs: No Transportation Needs (09/20/2023)  Utilities: Not At Risk (06/27/2023)  Alcohol  Screen:  Low Risk  (03/21/2023)  Depression (PHQ2-9): Medium Risk (11/27/2023)  Financial Resource Strain: Low Risk  (09/20/2023)  Physical Activity: Inactive (09/20/2023)  Social Connections: Moderately Integrated (09/20/2023)  Stress: Stress Concern Present (09/20/2023)  Tobacco Use: Low Risk  (11/27/2023)     Physical Exam HENT:     Head: Normocephalic and atraumatic.     Comments: Wearing hearing aids b/l    Mouth/Throat:     Mouth: Mucous membranes are moist.  Eyes:     Comments: Right eye pupil 3 mm, reactive, left eye pupil 2, legally blind on left side  Cardiovascular:     Rate and Rhythm: Normal rate.  Pulmonary:     Effort: Pulmonary effort is normal.     Breath sounds: Normal breath sounds.  Abdominal:     General: Bowel sounds are normal.     Palpations: Abdomen is soft.     Tenderness: There is no abdominal tenderness. There is no guarding.  Musculoskeletal:     Cervical  back: Normal range of motion and neck supple. No rigidity.  Neurological:     Mental Status: She is alert and oriented to person, place, and time.  Psychiatric:        Mood and Affect: Mood normal.        Behavior: Behavior is cooperative.        No results found for any visits on 11/27/23.  The ASCVD Risk score (Arnett DK, et al., 2019) failed to calculate for the following reasons:   The 2019 ASCVD risk score is only valid for ages 27 to 57     Assessment & Plan:   Acquired hypothyroidism Assessment & Plan: Patient's labs reviewed: TSH 05/16/23: 0.916, T4 1.72. She has not been taking Levothyroxine  50 mcg due to inconvenience of needing to take this medication on an empty stomach . Since her T4 was mildly elevated last time I recommend repeat TSH and re-start Levothyroxine  50 mcg daily in the morning.   Orders: -     TSH  Gastroesophageal reflux disease, unspecified whether esophagitis present Assessment & Plan: Chronic, taking Protonix  40 mg twice a day. GERD symptoms seems to be worse in the evening after using Breztri  for which she takes Tums. Continue current treatment regimen. Will check B 12, magnesium  level from time to time since she is on high dose PPI.    Mixed hyperlipidemia Assessment & Plan: LDL has been stable on Simvastatin  20 mg, continue. Check CMP.   Orders: -     Aspirin ; Take 1 tablet (81 mg total) by mouth daily. Swallow whole.  Dispense: 90 tablet; Refill: 3 -     Simvastatin ; TAKE 1 TABLET BY MOUTH EVERYDAY AT BEDTIME  Dispense: 90 tablet; Refill: 3 -     Comprehensive metabolic panel with GFR  Insomnia, unspecified type Assessment & Plan: Chronic, previously treated with Tylenol  PM, Ambien. Discourage due to potential s/e.  Start Melatonin 1mg  an hour before bedtime.  Sleep hygiene discussed.   Orders: -     Melatonin; Take 1 capsule (1 mg total) by mouth at bedtime.  Dispense: 90 capsule; Refill: 3  Age-related osteoporosis without current  pathological fracture Assessment & Plan: On reclast  yearly. Continue f/u with [facility] endocrinology clinic.  Continue calcium  600 mg twice daily with Vitamin D  2000 units daily.   Orders: -     Vitamin D3; Take 1 capsule (2,000 Units total) by mouth daily.  Dispense: 90 capsule; Refill: 3  Mood disorder Assessment & Plan: No  SI, HI.  Lost her twin sister last year which has been hard.  Discussed benefit of CBT with current medications including Escitalopram  10 mg and Mirtazapine  15 mg daily. Continue current medications (not needing refill yet) Patient politely declines psychologist referral. Continue to f/u and reevaluate.    Asthma, chronic, severe persistent, uncomplicated (HCC) Assessment & Plan: Chronic, planning on q3 monthly Fasenra , continues to use Breztri  2 puffs twice daily with prn albuterol  inhaler for rescue. Still feels exertional dyspnea. She is no longer on Prednisone .  Reviewed echo from 01/2022 which was reassuring, BP is normal. CBC was normal on 05/16/23. I recommend patient continues f/u with pulmonologist Dr. Tamea for asthma management.    Benign paroxysmal positional vertigo, unspecified laterality Assessment & Plan: Recurrent positional vertigo treated with Meclizine  by ENT. She is asymptomatic today. Continue f/u with ENT.    Chronic idiopathic constipation Assessment & Plan: Patient has a long h/o constipation. She is working on increasing daily water intake.  Recommend adding Docusate 100 mg prn on top of following measures, also provided with high fiber diet information: Try to drink plenty of water throughout the day. Aim for 6-8 glasses if you can, unless you have been told to limit fluids for another health reason. Eat More Fiber: Goal fiber is about 25 grams per day.  Include more fruits, vegetables, whole grains, and beans in your meals. Foods like prunes, pears, and bran cereals are especially helpful. Keep Moving: Gentle activity, such as  walking or stretching, can help keep your bowels regular. Set a Routine: Try to use the bathroom at the same time each day, especially after meals. Don't rush, and don't ignore the urge to go.   Orders: -     Docusate Sodium ; Take 1 capsule (100 mg total) by mouth daily as needed for mild constipation.  Dispense: 90 capsule; Refill: 1  Lacunar infarction [facility]) Assessment & Plan: Noted on CT and MRI head 05/17/23:  Small age-indeterminate lacunar infarct in the left basal Ganglia. On Aspirin  81 mg and Simvastatin  20 mg daily.   Orders: -     Aspirin ; Take 1 tablet (81 mg total) by mouth daily. Swallow whole.  Dispense: 90 tablet; Refill: 3 -     Simvastatin ; TAKE 1 TABLET BY MOUTH EVERYDAY AT BEDTIME  Dispense: 90 tablet; Refill: 3    Return in about 4 months (around 03/29/2024) for Chronic follow up .   Luke Shade, MD

## 2023-11-27 NOTE — Assessment & Plan Note (Signed)
 Recurrent positional vertigo treated with Meclizine  by ENT. She is asymptomatic today. Continue f/u with ENT.

## 2023-11-27 NOTE — Assessment & Plan Note (Signed)
 On reclast  yearly. Continue f/u with [facility] endocrinology clinic.  Continue calcium  600 mg twice daily with Vitamin D  2000 units daily.

## 2023-11-27 NOTE — Assessment & Plan Note (Signed)
 Chronic, taking Protonix  40 mg twice a day. GERD symptoms seems to be worse in the evening after using Breztri  for which she takes Tums. Continue current treatment regimen. Will check B 12, magnesium  level from time to time since she is on high dose PPI.

## 2023-11-27 NOTE — Assessment & Plan Note (Signed)
 Chronic, previously treated with Tylenol  PM, Ambien. Discourage due to potential s/e.  Start Melatonin 1mg  an hour before bedtime.  Sleep hygiene discussed.

## 2023-11-27 NOTE — Assessment & Plan Note (Addendum)
 Chronic, planning on q3 monthly Fasenra , continues to use Breztri  2 puffs twice daily with prn albuterol  inhaler for rescue. Still feels exertional dyspnea. She is no longer on Prednisone .  Reviewed echo from 01/2022 which was reassuring, BP is normal. CBC was normal on 05/16/23. I recommend patient continues f/u with pulmonologist Dr. Tamea for asthma management.

## 2023-11-27 NOTE — Assessment & Plan Note (Addendum)
 Patient's labs reviewed: TSH 05/16/23: 0.916, T4 1.72. She has not been taking Levothyroxine  50 mcg due to inconvenience of needing to take this medication on an empty stomach . Since her T4 was mildly elevated last time I recommend repeat TSH and re-start Levothyroxine  50 mcg daily in the morning.

## 2023-11-27 NOTE — Patient Instructions (Addendum)
 1. Stay Hydrated: Try to drink plenty of water throughout the day. Aim for 6-8 glasses if you can, unless you have been told to limit fluids for another health reason.  2. Eat More Fiber: Goal fiber is about 25 grams per day.  Include more fruits, vegetables, whole grains, and beans in your meals. Foods like prunes, pears, and bran cereals are especially helpful.  3. Keep Moving: Gentle activity, such as walking or stretching, can help keep your bowels regular.  4. Set a Routine: Try to use the bathroom at the same time each day, especially after meals. Don't rush, and don't ignore the urge to go.  5. Take stool softer called Docusate 100 mg, one tablet daily as needed.   6. Try Melatonin 1 mg an hour before bed.

## 2023-11-27 NOTE — Assessment & Plan Note (Addendum)
 LDL has been stable on Simvastatin  20 mg, continue. Check CMP.

## 2023-11-27 NOTE — Assessment & Plan Note (Signed)
 No SI, HI.  Lost her twin sister last year which has been hard.  Discussed benefit of CBT with current medications including Escitalopram  10 mg and Mirtazapine  15 mg daily. Continue current medications (not needing refill yet) Patient politely declines psychologist referral. Continue to f/u and reevaluate.

## 2023-11-28 ENCOUNTER — Ambulatory Visit: Payer: Self-pay

## 2023-11-28 DIAGNOSIS — F331 Major depressive disorder, recurrent, moderate: Secondary | ICD-10-CM

## 2023-11-28 LAB — COMPREHENSIVE METABOLIC PANEL WITH GFR
ALT: 8 U/L (ref 0–35)
AST: 14 U/L (ref 0–37)
Albumin: 4.2 g/dL (ref 3.5–5.2)
Alkaline Phosphatase: 44 U/L (ref 39–117)
BUN: 14 mg/dL (ref 6–23)
CO2: 30 meq/L (ref 19–32)
Calcium: 9 mg/dL (ref 8.4–10.5)
Chloride: 104 meq/L (ref 96–112)
Creatinine, Ser: 0.95 mg/dL (ref 0.40–1.20)
GFR: 54.45 mL/min — ABNORMAL LOW (ref >60.00)
Glucose, Bld: 93 mg/dL (ref 70–99)
Potassium: 4 meq/L (ref 3.5–5.1)
Sodium: 143 meq/L (ref 135–145)
Total Bilirubin: 0.5 mg/dL (ref 0.2–1.2)
Total Protein: 6.3 g/dL (ref 6.0–8.3)

## 2023-11-28 LAB — TSH: TSH: 5.24 u[IU]/mL (ref 0.35–5.50)

## 2023-11-29 ENCOUNTER — Encounter

## 2023-11-29 ENCOUNTER — Encounter: Payer: Self-pay | Admitting: Pulmonary Disease

## 2023-11-29 ENCOUNTER — Ambulatory Visit: Admitting: Pulmonary Disease

## 2023-11-29 VITALS — BP 110/66 | HR 86 | Temp 97.8°F | Ht 66.0 in | Wt 153.0 lb

## 2023-11-29 DIAGNOSIS — R0602 Shortness of breath: Secondary | ICD-10-CM | POA: Diagnosis not present

## 2023-11-29 DIAGNOSIS — J4550 Severe persistent asthma, uncomplicated: Secondary | ICD-10-CM

## 2023-11-29 DIAGNOSIS — J449 Chronic obstructive pulmonary disease, unspecified: Secondary | ICD-10-CM

## 2023-11-29 DIAGNOSIS — Z23 Encounter for immunization: Secondary | ICD-10-CM | POA: Diagnosis not present

## 2023-11-29 LAB — NITRIC OXIDE: Nitric Oxide: 26

## 2023-11-29 MED ORDER — BREZTRI AEROSPHERE 160-9-4.8 MCG/ACT IN AERO: 2.0000 | INHALATION_SPRAY | Freq: Two times a day (BID) | RESPIRATORY_TRACT | Status: DC

## 2023-11-29 NOTE — Progress Notes (Signed)
 Subjective:    Patient ID: Dorothy Church, female    DOB: October 08, 1935, 88 y.o.   MRN: [id number]  Patient Care Team: Bair, Kalpana, MD as PCP - General (Family Medicine) Avram Barnie NOVAK, FNP (Nurse Practitioner) Tamea Dedra CROME, MD as Consulting Physician (Pulmonary Disease)  Chief Complaint  Patient presents with   Asthma    Cough, shortness of breath and occasional wheezing.     BACKGROUND/INTERVAL:Patient is an 88 year old lifelong never smoker with a history of persistent asthma who presents for follow-up on the issue of dyspnea.  I first evaluated the patient on 12 December 2022.  Previously she was following with Dr. Alm Church.  At her initial visit with me she was switched to Breztri  2 puffs twice a day, PFTs were obtained.  She was then seen on 28 Jun 2023 at that time was prescribed biologic (Fasenra ) for control of her asthma.  This is a follow-up visit.  She was last seen on 29 August 2023  HPI Discussed the use of AI scribe software for clinical note transcription with the patient, who gave verbal consent to proceed.  History of Present Illness   Dorothy Church is an 88 year old female who presents for follow-up of severe persistent allergic asthma.  She recently experienced a cold with increased phlegm and coughing, which is not typical for her. She notes that it is currently ragweed season, which may exacerbate her symptoms.  She is using a Breztri  inhaler and is doing well with it. She is no longer on prednisone , which previously caused her to experience facial swelling.  She has not had a breathing test since January and is due for another one soon.  She has recently changed her primary care doctor to Dr. Abbey after her previous doctor left Dorothy Church. She has had five primary doctors over twelve years at the same location.     DATA 08/16/2014 PFTs: FEV1 0.93 L or 39% predicted, FVC 1.81 L or 57% predicted, FEV1/FVC 51%.  There is a significant bronchodilator  response with a 56% net change on FEV1 postbronchodilator to 1.46 L or 62% of predicted.  Fusion capacity was normal by alveolar volume.  Lung volumes normal. 12/12/2022 alpha 1 antitrypsin: MM phenotype, 156 mg/dL (normal) 89/76/7975 CBC with differential and allergen panel: No eosinophilia noted, IgE is 103, RAST panel positive for significant responses to dust mites less significant responses to cat dander, dog dander and ragweed. 12/12/2022 CXR PA and lateral: Hyperinflation, suggestion of COPD, no other acute cardiopulmonary disease 02/21/2023 PFTs: FEV1 0.68 L or 34% predicted, FVC 1.46 L or 55% predicted, FEV1/FVC 47%, there is a very significant postbronchodilator response with a net change of 35% in FEV1 post bronchodilator.  Lung volumes show mild hyperinflation and moderate air trapping.  Diffusion capacity moderately reduced.  Review of Systems A 10 point review of systems was performed and it is as noted above otherwise negative.   Patient Active Problem List   Diagnosis Date Noted   Lacunar infarction (HCC) 11/27/2023   Chronic idiopathic constipation 06/20/2023   Moderate persistent asthma without complication 06/20/2023   BPPV (benign paroxysmal positional vertigo) 03/13/2022   Allergic rhinitis 03/10/2021   Gastroesophageal reflux disease 09/28/2020   Gastric polyp 04/28/2020   Lower esophageal ring (Schatzki) 04/28/2020   Insomnia 12/30/2018   Hiatal hernia    Mood disorder 03/11/2017   Asthma, chronic, severe persistent, uncomplicated (HCC) 05/11/2016   OA (osteoarthritis) 04/04/2015   Osteoporosis 10/07/2012   Hypothyroidism  Hyperlipidemia 08/07/2012   COAD (chronic obstructive airways disease) (HCC) 02/12/2012    Social History   Tobacco Use   Smoking status: Never   Smokeless tobacco: Never  Substance Use Topics   Alcohol  use: Yes    Alcohol /week: 1.0 standard drink of alcohol     Types: 1 Glasses of wine per week    Comment: occas. wine    Allergies   Allergen Reactions   Amoxicillin Other (See Comments)    Pencillins   Cefzil [Cefprozil] Other (See Comments)    Unknown   Cephalexin Other (See Comments)    Unknown   Propoxyphene Other (See Comments)    Unknown   Relafen [Nabumetone] Other (See Comments)    Unknown   Talwin [Pentazocine] Other (See Comments)    Unknown   Penicillins Other (See Comments)    Cannot remember reaction    Current Meds  Medication Sig   acetaminophen  (TYLENOL ) 500 MG tablet Take 1,000 mg by mouth every 8 (eight) hours as needed (PAIN).   albuterol  (VENTOLIN  HFA) 108 (90 Base) MCG/ACT inhaler Inhale 2 puffs into the lungs every 6 (six) hours as needed for wheezing.   aspirin  EC 81 MG tablet Take 1 tablet (81 mg total) by mouth daily. Swallow whole.   benralizumab  (FASENRA  PEN) 30 MG/ML prefilled autoinjector Inject 30mg  into the skin at Week 0, Week 4, Week 8 then every 8 weeks   Budeson-Glycopyrrol-Formoterol  (BREZTRI  AEROSPHERE) 160-9-4.8 MCG/ACT AERO Inhale 2 puffs into the lungs in the morning and at bedtime.   budesonide -glycopyrrolate -formoterol  (BREZTRI  AEROSPHERE) 160-9-4.8 MCG/ACT AERO inhaler Inhale 2 puffs into the lungs in the morning and at bedtime.   calcium  carbonate (TUMS - DOSED IN MG ELEMENTAL CALCIUM ) 500 MG chewable tablet Chew 1 tablet by mouth 4 (four) times daily as needed for indigestion or heartburn.   Cholecalciferol (VITAMIN D3) 50 MCG (2000 UT) capsule Take 1 capsule (2,000 Units total) by mouth daily.   docusate sodium  (COLACE) 100 MG capsule Take 1 capsule (100 mg total) by mouth daily as needed for mild constipation.   escitalopram  (LEXAPRO ) 10 MG tablet Take 1 tablet (10 mg total) by mouth daily.   latanoprost (XALATAN) 0.005 % ophthalmic solution Place 1 drop into the right eye at bedtime.   levothyroxine  (SYNTHROID ) 50 MCG tablet TAKE 1 TABLET BY MOUTH EVERY DAY BEFORE BREAKFAST   loratadine  (CLARITIN ) 10 MG tablet TAKE 1 TABLET BY MOUTH EVERY DAY AS NEEDED FOR ALLERGY     meclizine  (ANTIVERT ) 25 MG tablet Take 1 tablet (25 mg total) by mouth 3 (three) times daily as needed for dizziness.   Melatonin 1 MG CAPS Take 1 capsule (1 mg total) by mouth at bedtime.   mirtazapine  (REMERON ) 15 MG tablet Take 1 tablet (15 mg total) by mouth at bedtime.   pantoprazole  (PROTONIX ) 40 MG tablet TAKE 1 TABLET BY MOUTH TWICE A DAY   simvastatin  (ZOCOR ) 20 MG tablet TAKE 1 TABLET BY MOUTH EVERYDAY AT BEDTIME    Immunization History  Administered Date(s) Administered   Fluad Quad(high Dose 65+) 12/05/2019, 11/04/2020   INFLUENZA, HIGH DOSE SEASONAL PF 12/02/2017, 12/08/2018, 11/29/2023   Influenza Whole 12/14/2010   Influenza,inj,Quad PF,6+ Mos 11/10/2012, 11/05/2013, 11/10/2014, 10/17/2015, 10/24/2016, 10/17/2021   Influenza-Unspecified 12/04/2011, 11/10/2012, 11/05/2013, 12/07/2018   Moderna Sars-Covid-2 Vaccination 03/03/2019, 03/31/2019, 11/30/2019, 12/26/2019, 07/07/2020   Pfizer(Comirnaty)Fall Seasonal Vaccine 12 years and older 12/01/2021, 08/07/2022   Pneumococcal Conjugate-13 10/07/2009, 04/23/2013, 11/24/2013   Pneumococcal Polysaccharide-23 11/13/2009, 12/19/2018   RSV,unspecified 08/13/2023   Td 04/17/2016  Unspecified SARS-COV-2 Vaccination 11/16/2022, 08/13/2023   Zoster Recombinant(Shingrix) 04/09/2018, 09/15/2018   Zoster, Live 05/08/2007        Objective:     BP 110/66   Pulse 86   Temp 97.8 F (36.6 C) (Temporal)   Ht 5' 6 (1.676 m)   Wt 153 lb (69.4 kg)   SpO2 96%   BMI 24.69 kg/m   SpO2: 96 %  GENERAL: Developed, well-nourished woman, no acute distress, fully ambulatory.  No conversational dyspnea. HEAD: Normocephalic, atraumatic.  EYES: Pupils equal, round, reactive to light.  No scleral icterus.  MOUTH: Dentition intact, oral mucosa moist.  No thrush. NECK: Supple. No thyromegaly. Trachea midline. No JVD.  No adenopathy. PULMONARY: Poor air entry bilaterally.  Coarse, no overt adventitious sounds. CARDIOVASCULAR: S1 and S2. Regular  rate and rhythm.  No rubs, murmurs or gallops heard. ABDOMEN: Benign. MUSCULOSKELETAL: No joint deformity, no clubbing, no edema.  NEUROLOGIC: No overt focal deficit, no gait disturbance, speech is fluent. SKIN: Intact,warm,dry. PSYCH: Mood and behavior normal.  Lab Results  Component Value Date   NITRICOXIDE 26 11/29/2023  *This result suggests indeterminate (26 ppb) Type II (T2) airway inflammation indicating a low likelihood of active T2-driven airway inflammation.  Given the indeterminate nature of the result clinical correlation is advised.      Assessment & Plan:     ICD-10-CM   1. Severe persistent allergic asthma (HCC)  J45.50 Nitric oxide     Pulmonary function test    2. Shortness of breath  R06.02 Pulmonary function test    3. Need for influenza vaccination  Z23 Flu vaccine HIGH DOSE PF(Fluzone Trivalent)      Orders Placed This Encounter  Procedures   Flu vaccine HIGH DOSE PF(Fluzone Trivalent)   Nitric oxide    Pulmonary function test    Standing Status:   Future    Expected Date:   02/29/2024    Expiration Date:   11/28/2024    Where should this test be performed?:   Outpatient Pulmonary    What type of PFT is being ordered?:   Full PFT    Meds ordered this encounter  Medications   budesonide -glycopyrrolate -formoterol  (BREZTRI  AEROSPHERE) 160-9-4.8 MCG/ACT AERO inhaler    Sig: Inhale 2 puffs into the lungs in the morning and at bedtime.    Dispense:  2 each    Lot Number?:   [id number]    Expiration Date?:   12/20/2025    Manufacturer?:   Astra[employer]en[employer] [[id number]]    NDC:   9689-5383-71 [[id number]]    Quantity:   2   Discussion:    Chronic obstructive pulmonary disease (COPD) COPD with inflammation, currently improving. Inflammation levels are decreasing, and she is no longer on prednisone , avoiding side effects such as osteoporosis and weight gain. Reports no significant cough outside of a recent cold with phlegm. Tolerating Breztri  well. - Provide Breztri   samples - Order pulmonary function tests in January - Administer flu shot today  Shortness of breath Shortness of breath related to COPD. - Order pulmonary function tests in January   Health maintenance - Patient received flu vaccine today    Will see the patient in follow-up in 3 months time call sooner should any new problems arise.   Advised if symptoms do not improve or worsen, to please contact office for sooner follow up or seek emergency care.    I spent 33 minutes of dedicated to the care of this patient on the date of this encounter to include pre-visit review  of records, face-to-face time with the patient discussing conditions above, post visit ordering of testing, clinical documentation with the electronic health record, making appropriate referrals as documented, and communicating necessary findings to members of the patients care team.   C. Leita Sanders, MD [facility]ch[facility] P[facility]  [facility]    *This note was generated using voice recognition software/Dragon and/or AI transcription program.  Despite best efforts to proofread, errors can occur which can change the meaning. Any transcriptional errors that result from this process are unintentional and may not be fully corrected at the time of dictation.

## 2023-11-29 NOTE — Patient Instructions (Addendum)
 VISIT SUMMARY:  Today, you came in for a follow-up on your pulmonary condition. You mentioned experiencing a recent cold with increased phlegm and coughing, which is unusual for you. You are currently using a Breztri  inhaler and doing well with it. You have switched your primary care doctor to Dr. Abbey after your previous doctor left [place]. You have had five primary doctors over twelve years at the same location.  YOUR PLAN:  - SEVERE PERSISTENT ASTHMA: Severe persistent asthma is a chronic inflammatory lung disease that causes obstructed airflow from the lungs. Your inflammation levels are decreasing, and you are no longer on prednisone , which helps you avoid side effects like osteoporosis and weight gain. You are tolerating the Breztri  inhaler well. We will provide you with Breztri  samples and order pulmonary function tests in January. You will also receive a flu shot today.  -SHORTNESS OF BREATH: Your shortness of breath is related to your severe persistent asthma. We will order pulmonary function tests in January to monitor your condition.  INSTRUCTIONS:  Please make sure to get your pulmonary function tests done in January. Continue using your Breztri  inhaler as prescribed. You will receive a flu shot today to help protect you during flu season.

## 2023-12-10 ENCOUNTER — Ambulatory Visit: Payer: Self-pay

## 2023-12-10 NOTE — Telephone Encounter (Signed)
 FYI Only or Action Required?: FYI only for provider.  Patient was last seen in primary care on 11/27/2023 by Abbey Bruckner, MD.  Called Nurse Triage reporting Foot Injury.  Symptoms began a week ago.  Interventions attempted: Rest, hydration, or home remedies.  Symptoms are: gradually worsening.  Triage Disposition: See PCP Within 2 Weeks  Patient/caregiver understands and will follow disposition?: Yes, will follow disposition  Copied from CRM [id number]. Topic: Clinical - Red Word Triage >> Dec 10, 2023  4:16 PM Dorothy Church wrote: Red Word that prompted transfer to Nurse Triage: patient called stating she was doing exercises and she was stepping on a step and now she is having  right heel pain Reason for Disposition  [1] MILD pain (e.g., does not interfere with normal activities) AND [2] present > 7 days  Answer Assessment - Initial Assessment Questions 1. ONSET: When did the pain start?      About a week ago 2. LOCATION: Where is the pain located?      R heel 3. PAIN: How bad is the pain?    (Scale 1-10; or mild, moderate, severe)     7 if stepping on it 4. WORK OR EXERCISE: Has there been any recent work or exercise that involved this part of the body?      Denies injury, states was doing a ball exercise now hurts 5. CAUSE: What do you think is causing the foot pain?     Unsure, denies prior surgery to the foot 6. OTHER SYMPTOMS: Do you have any other symptoms? (e.g., leg pain, rash, fever, numbness)     denies  Protocols used: Foot Pain-A-AH

## 2023-12-16 ENCOUNTER — Encounter: Payer: Self-pay | Admitting: Pharmacist

## 2023-12-16 ENCOUNTER — Ambulatory Visit: Admitting: Internal Medicine

## 2023-12-16 DIAGNOSIS — H401110 Primary open-angle glaucoma, right eye, stage unspecified: Secondary | ICD-10-CM | POA: Diagnosis not present

## 2023-12-16 NOTE — Progress Notes (Signed)
   12/16/2023  Patient ID: Dorothy Church, female   DOB: 13-Mar-1935, 88 y.o.   MRN: [id number] Pharmacy Quality Measure Review  This patient is appearing on a report for being at risk of failing the adherence measure for cholesterol (statin) medications this calendar year.   Medication: Simvastatin  20 mg Last fill date: 09/19/23 for 90 day supply  Will follow back up about refills. Refill due 12/20/2023  Dorothy Church, PharmD, BCACP Clinical Pharmacist [phone]   ADDENDUM Simvastatin  20 mg filled 12/11/23 #90  No action needed currently passing the measure.

## 2023-12-22 ENCOUNTER — Other Ambulatory Visit: Payer: Self-pay | Admitting: Nurse Practitioner

## 2023-12-22 DIAGNOSIS — F331 Major depressive disorder, recurrent, moderate: Secondary | ICD-10-CM

## 2023-12-24 ENCOUNTER — Encounter: Payer: Self-pay | Admitting: Podiatry

## 2023-12-24 ENCOUNTER — Ambulatory Visit (INDEPENDENT_AMBULATORY_CARE_PROVIDER_SITE_OTHER)

## 2023-12-24 ENCOUNTER — Ambulatory Visit: Admitting: Podiatry

## 2023-12-24 VITALS — Ht 66.0 in | Wt 153.0 lb

## 2023-12-24 DIAGNOSIS — M7751 Other enthesopathy of right foot: Secondary | ICD-10-CM

## 2023-12-24 DIAGNOSIS — M722 Plantar fascial fibromatosis: Secondary | ICD-10-CM | POA: Diagnosis not present

## 2023-12-24 DIAGNOSIS — R52 Pain, unspecified: Secondary | ICD-10-CM

## 2023-12-24 MED ORDER — ESCITALOPRAM OXALATE 10 MG PO TABS: 10.0000 mg | ORAL_TABLET | Freq: Every day | ORAL | 1 refills | Status: AC

## 2023-12-24 MED ORDER — MELOXICAM 15 MG PO TABS: 15.0000 mg | ORAL_TABLET | Freq: Every day | ORAL | 1 refills | Status: DC

## 2023-12-24 MED ORDER — BETAMETHASONE SOD PHOS & ACET 6 (3-3) MG/ML IJ SUSP
3.0000 mg | Freq: Once | INTRAMUSCULAR | Status: AC
  Administered 2023-12-24: 3 mg via INTRA_ARTICULAR

## 2023-12-24 NOTE — Addendum Note (Signed)
 Addended by: Jerald Hennington on: 12/24/2023 01:50 PM   Modules accepted: Orders

## 2023-12-24 NOTE — Progress Notes (Signed)
 Chief Complaint  Patient presents with   Foot Pain    Pt is here due to right heel pain, states it has been going on for about a month, no injuries to the foot that she can remember.    Subjective: 88 y.o. female presenting today for evaluation new complaint of right heel pain ongoing for about 1 month.  Idiopathic.  She did begin to do calf stretching exercises at the same time as she began to notice right heel pain.  She has not anything currently for treatment   Past Medical History:  Diagnosis Date   Abdominal pain    Abnormal CT of liver    Accident due to mechanical fall without injury 01/28/2022   Acute pain of left shoulder 09/28/2020   Anxiety    Anxiety 04/28/2020   Aortic stenosis    Aortic stenosis 03/04/2012   Overview:   Not confirmed by Dr Gollan.    Aortic valve disorders    Arthritis    Asthma    Asthma 08/07/2012   Barrett's esophagus    [facility] last EGD 02/2018 Dr. Gaylyn    Barrett's esophagus 03/11/2017   Overview:  Dr Gaylyn   Carotid artery stenosis 12/30/2018   1-39% b/l carotids       Colon polyps    Colon polyps    COPD (chronic obstructive pulmonary disease) (HCC)    DDD (degenerative disc disease), cervical    DDD (degenerative disc disease), cervical 04/28/2020   DDD (degenerative disc disease), lumbar 01/22/2017   Depressive disorder    Diverticulosis    Erosive esophagitis    Erosive esophagitis 04/28/2020   Fundic gland polyps of stomach, benign    Gastric polyp    Gastric polyps    Gastritis    Generalized osteoarthritis of hand 05/03/2015   GERD (gastroesophageal reflux disease)    GOA (generalized osteoarthritis) 11/23/2015   Goiter    Hiatal hernia    small    History of colonic polyps 12/08/2013   Hypercholesteremia    Hypothyroidism    Impaired fasting glucose    Impaired fasting glucose 03/04/2012   Overview:   Glucose 111 02/2012   Intrinsic asthma, unspecified    Joint pain of ankle and foot, right 05/17/2015    Left rib fracture    Mechanical complication of knee prosthesis, initial encounter 08/25/2017   Obstructive sleep apnea (adult) (pediatric)    used cpap x 10 years per Dr. Isaiah off as of 12/30/18    Orthostatic hypotension 05/18/2023   OSA (obstructive sleep apnea)    used cpap x 10 years per Dr. 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   Stress 04/28/2020   Syncope and collapse    TIA (transient ischemic attack) 12/11/2016   TIA (transient ischemic attack) 12/11/2016   UTI (urinary tract infection)    Vitamin D  deficiency     Past Surgical History:  Procedure Laterality Date   BREAST EXCISIONAL BIOPSY Left    BREAST SURGERY     CATARACT EXTRACTION W/PHACO Right 11/07/2021   Procedure: CATARACT EXTRACTION PHACO AND INTRAOCULAR LENS PLACEMENT (IOC) RIGHT  8.87 00:50.8;  Surgeon: Jaye Fallow, MD;  Location: [facility];  Service: Ophthalmology;  Laterality: Right;   COLONOSCOPY     COLONOSCOPY WITH ESOPHAGOGASTRODUODENOSCOPY (EGD)     COLONOSCOPY WITH PROPOFOL  N/A 12/14/2014   Procedure: COLONOSCOPY WITH PROPOFOL ;  Surgeon: Gladis RAYMOND Mariner, MD;  Location: [facility];  Service: Endoscopy;  Laterality: N/A;   COLONOSCOPY WITH PROPOFOL  N/A 03/18/2018   Procedure: COLONOSCOPY WITH PROPOFOL ;  Surgeon: Mariner Gladis RAYMOND, MD;  Location: [facility] END[facility]COPY;  Service: Endoscopy;  Laterality: N/A;   ENUCLEATION     ESOPHAGOGASTRODUODENOSCOPY N/A 08/30/2020   Procedure: ESOPHAGOGASTRODUODENOSCOPY (EGD);  Surgeon: Maryruth Ole DASEN, MD;  Location: [facility] END[facility]COPY;  Service: Endoscopy;  Laterality: N/A;   ESOPHAGOGASTRODUODENOSCOPY (EGD) WITH PROPOFOL  N/A 12/14/2014   Procedure: ESOPHAGOGASTRODUODENOSCOPY (EGD) WITH PROPOFOL ;  Surgeon:  Gladis RAYMOND Mariner, MD;  Location[facility];  Service: Endoscopy;  Laterality: N/A;   ESOPHAGOGASTRODUODENOSCOPY (EGD) WITH PROPOFOL  N/A 10/18/2016   Procedure: ESOPHAGOGASTRODUODENOSCOPY (EGD) WITH PROPOFOL ;  Surgeon: Mariner Gladis RAYMOND, MD;  Location: [facility] ENDOSCOPY;  Service: Endoscopy;  Laterality: N/A;   ESOPHAGOGASTRODUODENOSCOPY (EGD) WITH PROPOFOL  N/A 03/18/2018   Procedure: ESOPHAGOGASTRODUODENOSCOPY (EGD) WITH PROPOFOL ;  Surgeon: Mariner Gladis RAYMOND, MD;  Location: [facility] ENDOSCOPY;  Service: Endoscopy;  Laterality: N/A;   ESOPHAGOGASTRODUODENOSCOPY (EGD) WITH PROPOFOL  N/A 12/19/2020   Procedure: ESOPHAGOGASTRODUODENOSCOPY (EGD) WITH PROPOFOL ;  Surgeon: Maryruth Ole DASEN, MD;  Location: [facility] ENDOSCOPY;  Service: Endoscopy;  Laterality: N/A;   EYE SURGERY     FRACTURE SURGERY     HIP SURGERY Left    pin and plate   IR ANGIO INTRA EXTRACRAN SEL COM CAROTID INNOMINATE BILAT MOD SED  10/17/2018   IR ANGIO VERTEBRAL SEL VERTEBRAL BILAT MOD SED  10/17/2018   IR ANGIOGRAM EXTREMITY LEFT  10/17/2018   JOINT REPLACEMENT     Left Total Knee Arthroplasty     Dr Mardee and also had 39 years ago from 2020    nasal polyps     removed x 2 affects sense of smell x 10-15 years from 04/28/20   prosthesis eye implant     REPLACEMENT UNICONDYLAR JOINT KNEE Left    TONSILLECTOMY     TOTAL KNEE REVISION Left 09/13/2017   Procedure: TOTAL KNEE REVISION;  Surgeon: Mardee Lynwood SQUIBB, MD;  Location: [facility] ORS;  Service: Orthopedics;  Laterality: Left;    Allergies  Allergen Reactions   Amoxicillin Other (See Comments)    Pencillins   Cefzil [Cefprozil] Other (See Comments)    Unknown   Cephalexin Other (See Comments)    Unknown   Propoxyphene Other (See Comments)    Unknown   Relafen [Nabumetone] Other (See Comments)    Unknown   Talwin [Pentazocine] Other (See Comments)    Unknown   Penicillins Other (See Comments)    Cannot remember reaction      Objective: Physical Exam General:  The patient is alert and oriented x3 in no acute distress.  Dermatology: Skin is warm, dry and supple bilateral lower extremities. Negative for open lesions or macerations bilateral.   Vascular: Dorsalis Pedis and Posterior Tibial pulses palpable bilateral.  Capillary fill time is immediate to all digits.  Neurological: Grossly intact via light touch  Musculoskeletal: Tenderness to palpation to the plantar aspect of the right heel along the plantar fascia. All other joints range of motion within normal limits bilateral. Strength 5/5 in all groups bilateral.   Radiographic exam RT foot 12/24/2023: Normal osseous mineralization. Joint spaces preserved. No fracture/dislocation/boney destruction. No other soft tissue abnormalities or radiopaque foreign bodies.  Small posterior and plantar heel spur noted on lateral view  Assessment: 1. Plantar fasciitis right  Plan of Care:  -Patient evaluated. Xrays reviewed.   -Injection of 0.5cc Celestone  soluspan injected into the right plantar fascia  -Prescription for meloxicam  15 mg daily -Advised against going barefoot.  Recommended supportive tennis shoes and sneakers.  Currently wears a pair of Vionic shoes -Return to clinic 4 weeks   Thresa EMERSON Sar, DPM [facility]  Dr. Thresa EMERSON Sar, DPM    [address] [place], [place] [address]                Office [phone]  Fax [phone]

## 2023-12-25 ENCOUNTER — Emergency Department: Admission: EM | Admit: 2023-12-25 | Discharge: 2023-12-25 | Disposition: A | Discharge status: Home / Self Care

## 2023-12-25 ENCOUNTER — Other Ambulatory Visit: Payer: Self-pay

## 2023-12-25 ENCOUNTER — Encounter: Payer: Self-pay | Admitting: *Deleted

## 2023-12-25 ENCOUNTER — Emergency Department

## 2023-12-25 DIAGNOSIS — J4489 Other specified chronic obstructive pulmonary disease: Secondary | ICD-10-CM | POA: Diagnosis not present

## 2023-12-25 DIAGNOSIS — K2950 Unspecified chronic gastritis without bleeding: Secondary | ICD-10-CM | POA: Diagnosis not present

## 2023-12-25 DIAGNOSIS — R112 Nausea with vomiting, unspecified: Secondary | ICD-10-CM

## 2023-12-25 LAB — HEPATIC FUNCTION PANEL
ALT: 11 U/L (ref 0–44)
AST: 17 U/L (ref 15–41)
Albumin: 3.9 g/dL (ref 3.5–5.0)
Alkaline Phosphatase: 44 U/L (ref 38–126)
Bilirubin, Direct: 0.1 mg/dL (ref 0.0–0.2)
Indirect Bilirubin: 0.6 mg/dL (ref 0.3–0.9)
Total Bilirubin: 0.7 mg/dL (ref 0.0–1.2)
Total Protein: 7.1 g/dL (ref 6.5–8.1)

## 2023-12-25 LAB — BASIC METABOLIC PANEL WITH GFR
Anion gap: 10 (ref 5–15)
BUN: 15 mg/dL (ref 8–23)
CO2: 26 mmol/L (ref 22–32)
Calcium: 9.6 mg/dL (ref 8.9–10.3)
Chloride: 103 mmol/L (ref 98–111)
Creatinine, Ser: 0.82 mg/dL (ref 0.44–1.00)
GFR, Estimated: >60 mL/min (ref >60)
Glucose, Bld: 102 mg/dL — ABNORMAL HIGH (ref 70–99)
Potassium: 4 mmol/L (ref 3.5–5.1)
Sodium: 139 mmol/L (ref 135–145)

## 2023-12-25 LAB — CBC
HCT: 38.8 % (ref 36.0–46.0)
Hemoglobin: 13.2 g/dL (ref 12.0–15.0)
MCH: 30.9 pg (ref 26.0–34.0)
MCHC: 34 g/dL (ref 30.0–36.0)
MCV: 90.9 fL (ref 80.0–100.0)
Platelets: 234 K/uL (ref 150–400)
RBC: 4.27 MIL/uL (ref 3.87–5.11)
RDW: 12.8 % (ref 11.5–15.5)
WBC: 9.4 K/uL (ref 4.0–10.5)
nRBC: 0 % (ref 0.0–0.2)

## 2023-12-25 LAB — TROPONIN I (HIGH SENSITIVITY)
Troponin I (High Sensitivity): 6 ng/L (ref <18)
Troponin I (High Sensitivity): 7 ng/L (ref <18)

## 2023-12-25 LAB — MAGNESIUM: Magnesium: 2.4 mg/dL (ref 1.7–2.4)

## 2023-12-25 LAB — LIPASE, BLOOD: Lipase: 31 U/L (ref 11–51)

## 2023-12-25 MED ORDER — PANTOPRAZOLE SODIUM 40 MG IV SOLR
40.0000 mg | Freq: Once | INTRAVENOUS | Status: AC
  Administered 2023-12-25: 40 mg via INTRAVENOUS
  Filled 2023-12-25: qty 10

## 2023-12-25 MED ORDER — ONDANSETRON HCL 4 MG/2ML IJ SOLN
4.0000 mg | Freq: Once | INTRAMUSCULAR | Status: AC
  Administered 2023-12-25: 4 mg via INTRAVENOUS
  Filled 2023-12-25: qty 2

## 2023-12-25 MED ORDER — ONDANSETRON 4 MG PO TBDP: 4.0000 mg | ORAL_TABLET | Freq: Three times a day (TID) | ORAL | 0 refills | Status: DC | PRN

## 2023-12-25 MED ORDER — SODIUM CHLORIDE 0.9 % IV BOLUS
500.0000 mL | Freq: Once | INTRAVENOUS | Status: AC
  Administered 2023-12-25: 500 mL via INTRAVENOUS

## 2023-12-25 MED ORDER — FLEET ENEMA RE ENEM: 1.0000 | ENEMA | Freq: Every day | RECTAL | 0 refills | Status: AC | PRN

## 2023-12-25 MED ORDER — FAMOTIDINE IN NACL 20-0.9 MG/50ML-% IV SOLN
20.0000 mg | Freq: Once | INTRAVENOUS | Status: AC
  Administered 2023-12-25: 20 mg via INTRAVENOUS
  Filled 2023-12-25: qty 50

## 2023-12-25 MED ORDER — ALUM & MAG HYDROXIDE-SIMETH 200-200-20 MG/5ML PO SUSP
30.0000 mL | Freq: Once | ORAL | Status: AC
  Administered 2023-12-25: 30 mL via ORAL
  Filled 2023-12-25: qty 30

## 2023-12-25 MED ORDER — SUCRALFATE 1 G PO TABS
1.0000 g | ORAL_TABLET | Freq: Once | ORAL | Status: AC
  Administered 2023-12-25: 1 g via ORAL
  Filled 2023-12-25: qty 1

## 2023-12-25 MED ORDER — SUCRALFATE 1 G PO TABS: 1.0000 g | ORAL_TABLET | Freq: Three times a day (TID) | ORAL | 1 refills | Status: AC

## 2023-12-25 NOTE — Discharge Instructions (Signed)
 Please be seen by your primary care physician and schedule an appointment with gastroenterology as soon as possible.  Discussed the indications for repeat endoscopy.  Take your regular medications and stick to bland foods.  Fluids are more important than solids.  Return with any acutely worsening symptoms or any other emergency -- RETURN PRECAUTIONS & AFTERCARE: (ENGLISH) RETURN PRECAUTIONS: Return immediately to the emergency department or see/call your doctor if you feel worse, weak or have changes in speech or vision, are short of breath, have fever, vomiting, pain, bleeding or dark stool, trouble urinating or any new issues. Return here or see/call your doctor if not improving as expected for your suspected condition. FOLLOW-UP CARE: Call your doctor and/or any doctors we referred you to for more advice and to make an appointment. Do this today, tomorrow or after the weekend. Some doctors only take PPO insurance so if you have HMO insurance you may want to contact your HMO or your regular doctor for referral to a specialist within your plan. Either way tell the doctor's office that it was a referral from the emergency department so you get the soonest possible appointment.  YOUR TEST RESULTS: Take result reports of any blood or urine tests, imaging tests and EKG's to your doctor and any referral doctor. Have any abnormal tests repeated. Your doctor or a referral doctor can let you know when this should be done. Also make sure your doctor contacts this hospital to get any test results that are not currently available such as cultures or special tests for infection and final imaging reports, which are often not available at the time you leave the ER but which may list additional important findings that are not documented on the preliminary report. BLOOD PRESSURE: If your blood pressure was greater than 120/80 have your blood pressure rechecked within 1 to 2 weeks. MEDICATION SIDE EFFECTS: Do not drive,  walk, bike, take the bus, etc. if you have received or are being prescribed any sedating medications such as those for pain or anxiety or certain antihistamines like Benadryl . If you have been give one of these here get a taxi home or have a friend drive you home. Ask your pharmacist to counsel you on potential side effects of any new medication

## 2023-12-25 NOTE — ED Provider Notes (Signed)
 [facility] Provider Note    Event Date/Time   First MD Initiated Contact with Patient 12/25/23 1754     (approximate)   History   Gastroesophageal Reflux   HPI  Dorothy Church is a 88 y.o. female asthma, Barrett's esophagus, COPD aortic stenosis, TIA who presents to the emergency department with 16 hours of nausea vomiting, increased burping, and burning in her chest.  Patient states that she has a long history of reflux however this is more intense than usual.  She is compliant with all of her medications including her pantoprazole .  She had several episodes of vomiting this morning and reports that the emesis was brown.  Her last bowel movement was this morning and was fully formed.  She states that she always has a history of chronic constipation but does not feel that this is much of an issue today.  Denies any abdominal pain.  She is followed by gastroenterology with her last appointment over a year ago.  Her last scope was more than 2 years ago.  Denies any fevers or chills, shortness of breath, chest pain or changes in urinary habits.  She presents with a friend who is at bedside who helps contribute to the history      Physical Exam   Triage Vital Signs: ED Triage Vitals  Encounter Vitals Group     BP 12/25/23 1623 (!) 146/79     Girls Systolic BP Percentile --      Girls Diastolic BP Percentile --      Boys Systolic BP Percentile --      Boys Diastolic BP Percentile --      Pulse Rate 12/25/23 1623 (!) 102     Resp 12/25/23 1623 20     Temp 12/25/23 1623 97.9 F (36.6 C)     Temp Source 12/25/23 1623 Oral     SpO2 12/25/23 1623 100 %     Weight 12/25/23 1624 150 lb (68 kg)     Height 12/25/23 1624 5' 5 (1.651 m)     Head Circumference --      Peak Flow --      Pain Score 12/25/23 1624 4     Pain Loc --      Pain Education --      Exclude from Growth Chart --     Most recent vital signs: Vitals:   12/25/23 1930 12/25/23 2015  BP:  139/85   Pulse: 88 90  Resp: 17 18  Temp:  98.2 F (36.8 C)  SpO2: 95% 95%    Nursing Triage Note reviewed. Vital signs reviewed and patients oxygen  saturation is normoxic  General: Patient is well nourished, well developed, awake and alert, resting comfortably in no acute distress Head: Normocephalic and atraumatic Eyes: Normal inspection, extraocular muscles intact, no conjunctival pallor Ear, nose, throat: Normal external exam, handling secretions, no evidence of blood in the throat Neck: Normal range of motion, no crepitus around the neck Respiratory: Patient is in no respiratory distress, lungs CTAB Cardiovascular: Patient is not tachycardic, RRR without murmur appreciated GI: Abd SNT with no guarding or rebound, no CVA tenderness to palpation Back: Normal inspection of the back with good strength and range of motion throughout all ext Extremities: pulses intact with good cap refills, no LE pitting edema or calf tenderness Neuro: The patient is alert and oriented to person, place, and time, appropriately conversive, with 5/5 bilat UE/LE strength, no gross motor or sensory defects noted. Coordination  appears to be adequate. Skin: Warm, dry, and intact Psych: normal mood and affect, no SI or HI  ED Results / Procedures / Treatments   Labs (all labs ordered are listed, but only abnormal results are displayed) Labs Reviewed  BASIC METABOLIC PANEL WITH GFR - Abnormal; Notable for the following components:      Result Value   Glucose, Bld 102 (*)    All other components within normal limits  CBC  MAGNESIUM   HEPATIC FUNCTION PANEL  LIPASE, BLOOD  TROPONIN I (HIGH SENSITIVITY)  TROPONIN I (HIGH SENSITIVITY)     EKG EKG and rhythm strip are interpreted by myself:   EKG: [tachycardic sinus rhythm] at heart rate of 99, normal QRS duration, QTc 444, nonspecific ST segments and T waves no ectopy EKG not consistent with Acute STEMI Rhythm strip: tachcyardic sinus rhyth in lead  II   RADIOLOGY Chest Xray: No acute abnormality on my independent review interpretation and radiologist agrees KUB: No acute abnormality on my independent review interpretation radiologist reads this as moderate stool burden    PROCEDURES:  Critical Care performed: No  Procedures   MEDICATIONS ORDERED IN ED: Medications  pantoprazole  (PROTONIX ) injection 40 mg (40 mg Intravenous Given 12/25/23 1855)  famotidine  (PEPCID ) IVPB 20 mg premix (0 mg Intravenous Stopped 12/25/23 1940)  sucralfate (CARAFATE) tablet 1 g (1 g Oral Given 12/25/23 1856)  sodium chloride  0.9 % bolus 500 mL (0 mLs Intravenous Stopped 12/25/23 2015)  ondansetron  (ZOFRAN ) injection 4 mg (4 mg Intravenous Given 12/25/23 1855)  alum & mag hydroxide-simeth (MAALOX/MYLANTA) 200-200-20 MG/5ML suspension 30 mL (30 mLs Oral Given 12/25/23 2014)     IMPRESSION / MDM / ASSESSMENT AND PLAN / ED COURSE                                Differential diagnosis includes, but is not limited to, worsening spirits esophagitis, gastroenteritis, gastritis, malignancy, anemia, atypical ACS   ED course: Patient is well-appearing handling her secretions and her abdominal exam is completely benign despite firm palpation.  EKG demonstrates no evidence of acute ischemia.  Troponin was not elevated and she had no leukocytosis anemia or profound electrolyte derangements.  Will treat the patient symptomatically right now with Zofran  Protonix  sucralfate and IV fluids along with Pepcid .  I have low suspicion for an acute worsening of her Barrett's esophagus although occult malignancy cannot be ruled out.  If repeat troponin and remainder of labs unremarkable patient can likely follow-up with symptomatic treatment with GI expeditiously which she would prefer   Clinical Course as of 12/26/23 0011  Wed Dec 25, 2023  1812 Troponin I (High Sensitivity): 6 Reassured [HD]  1813 WBC: 9.4 No leukocytosis [HD]  1813 HCT: 38.8 No profound anemia [HD]   1917 Patient reassessed and is feeling improved after interventions.  Understands plan of care [HD]  1930 DG Abdomen 1 View No acute abnormality [HD]  1942 Troponin I (High Sensitivity): 7 Negative x 2 [HD]  1944 Patient reassessed has been able to take p.o.  Feels comfortable returning home.  Will send the patient home with prescription for [facility] fate and Zofran  and a Fleet enema.  Will encourage her to follow-up with her GI specialist as soon as possible and primary care physician.  Repeat abdominal exam is completely benign [HD]    Clinical Course User Index [HD] Nicholaus Rolland BRAVO, MD   At time of discharge there is no evidence  of acute life, limb, vision, or fertility threat. Patient has stable vital signs, pain is well controlled, patient is ambulatory and p.o. tolerant.  Discharge instructions were completed using the EPIC system. I would refer you to those at this time. All warnings prescriptions follow-up etc. were discussed in detail with the patient. Patient indicates understanding and is agreeable with this plan. All questions answered.  Patient is made aware that they may return to the emergency department for any worsening or new condition or for any other emergency.  -- Risk: 5 This patient has a high risk of morbidity due to further diagnostic testing or treatment. Rationale: This patient's evaluation and management involve a high risk of morbidity due to the potential severity of presenting symptoms, need for diagnostic testing, and/or initiation of treatment that may require close monitoring. The differential includes conditions with potential for significant deterioration or requiring escalation of care. Treatment decisions in the ED, including medication administration, procedural interventions, or disposition planning, reflect this level of risk. COPA: 5 The patient has the following acute or chronic illness/injury that poses a possible threat to life or bodily function: [X] :  The patient has a potentially serious acute condition or an acute exacerbation of a chronic illness requiring urgent evaluation and management in the Emergency Department. The clinical presentation necessitates immediate consideration of life-threatening or function-threatening diagnoses, even if they are ultimately ruled out.   FINAL CLINICAL IMPRESSION(S) / ED DIAGNOSES   Final diagnoses:  Nausea and vomiting, unspecified vomiting type  Chronic gastritis without bleeding, unspecified gastritis type     Rx / DC Orders   ED Discharge Orders          Ordered    sucralfate (CARAFATE) 1 g tablet  3 times daily with meals & bedtime        12/25/23 1946    ondansetron  (ZOFRAN -ODT) 4 MG disintegrating tablet  Every 8 hours PRN        12/25/23 1946    sodium phosphate  (FLEET) ENEM  Daily PRN        12/25/23 1946             Note:  This document was prepared using Dragon voice recognition software and may include unintentional dictation errors.   Nicholaus Rolland BRAVO, MD 12/26/23 [id number]

## 2023-12-25 NOTE — ED Triage Notes (Signed)
 Pt ambulatory to triage.  Pt reports gastric reflux and vomiting up brown emesis. Sx began today.    Pt reports burning in chest.  Pt alert  speech clear.

## 2023-12-27 ENCOUNTER — Telehealth: Payer: Self-pay | Admitting: Lab

## 2023-12-27 ENCOUNTER — Telehealth: Payer: Self-pay | Admitting: Pharmacist

## 2023-12-27 NOTE — Telephone Encounter (Signed)
 Submitted a Prior Authorization RENEWAL request to [employer] for FASENRA  via CoverMyMeds. Will update once we receive a response.  Key: [id number]

## 2023-12-27 NOTE — Telephone Encounter (Signed)
 Patient states was in the emergency room for stomach issues and was told to stop meloxicam  patient would like to know what else can she take.

## 2023-12-30 NOTE — Telephone Encounter (Signed)
 Received notification from [employer] regarding a prior authorization for TEZSPIRE . Authorization has been APPROVED from 12/27/2023 to 02/18/2025. Approval letter sent to scan center.  Authorization # [id number]  Sherry Pennant, PharmD, MPH, BCPS, CPP Clinical Pharmacist [facility])

## 2023-12-31 ENCOUNTER — Ambulatory Visit: Admitting: Nurse Practitioner

## 2024-01-19 ENCOUNTER — Other Ambulatory Visit: Payer: Self-pay | Admitting: Nurse Practitioner

## 2024-01-19 DIAGNOSIS — K219 Gastro-esophageal reflux disease without esophagitis: Secondary | ICD-10-CM

## 2024-01-21 ENCOUNTER — Ambulatory Visit: Admitting: Podiatry

## 2024-01-21 DIAGNOSIS — M722 Plantar fascial fibromatosis: Secondary | ICD-10-CM | POA: Diagnosis not present

## 2024-01-21 NOTE — Progress Notes (Signed)
 Chief Complaint  Patient presents with   Plantar Fasciitis    PF right foot follow up. She has not take the Meloxicam  due to being advised against it by the ER on 12/25/23. She has been doing the stretches that have helped. Pain scale 4/10    Subjective: 88 y.o. female presenting today for f/u PF RT. Improved.  She experienced GI upset with the meloxicam  and is no longer taking it.   Past Medical History:  Diagnosis Date   Abdominal pain    Abnormal CT of liver    Accident due to mechanical fall without injury 01/28/2022   Acute pain of left shoulder 09/28/2020   Anxiety    Anxiety 04/28/2020   Aortic stenosis    Aortic stenosis 03/04/2012   Overview:   Not confirmed by Dr Gollan.    Aortic valve disorders    Arthritis    Asthma    Asthma 08/07/2012   Barrett's esophagus    [facility] last EGD 02/2018 Dr. Gaylyn    Barrett's esophagus 03/11/2017   Overview:  Dr Gaylyn   Carotid artery stenosis 12/30/2018   1-39% b/l carotids       Colon polyps    Colon polyps    COPD (chronic obstructive pulmonary disease) (HCC)    DDD (degenerative disc disease), cervical    DDD (degenerative disc disease), cervical 04/28/2020   DDD (degenerative disc disease), lumbar 01/22/2017   Depressive disorder    Diverticulosis    Erosive esophagitis    Erosive esophagitis 04/28/2020   Fundic gland polyps of stomach, benign    Gastric polyp    Gastric polyps    Gastritis    Generalized osteoarthritis of hand 05/03/2015   GERD (gastroesophageal reflux disease)    GOA (generalized osteoarthritis) 11/23/2015   Goiter    Hiatal hernia    small    History of colonic polyps 12/08/2013   Hypercholesteremia    Hypothyroidism    Impaired fasting glucose    Impaired fasting glucose 03/04/2012   Overview:   Glucose 111 02/2012   Intrinsic asthma, unspecified    Joint pain of ankle and foot, right 05/17/2015   Left rib fracture    Mechanical complication of knee prosthesis, initial encounter  08/25/2017   Obstructive sleep apnea (adult) (pediatric)    used cpap x 10 years per Dr. Isaiah off as of 12/30/18    Orthostatic hypotension 05/18/2023   OSA (obstructive sleep apnea)    used cpap x 10 years per Dr. 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   Stress 04/28/2020   Syncope and collapse    TIA (transient ischemic attack) 12/11/2016   TIA (transient ischemic attack) 12/11/2016   UTI (urinary tract infection)    Vitamin D  deficiency     Past Surgical History:  Procedure Laterality Date   BREAST EXCISIONAL BIOPSY Left    BREAST SURGERY     CATARACT EXTRACTION W/PHACO Right 11/07/2021   Procedure: CATARACT EXTRACTION PHACO AND INTRAOCULAR LENS PLACEMENT (IOC) RIGHT  8.87 00:50.8;  Surgeon: Jaye Fallow, MD;  Location: [facility];  Service: Ophthalmology;  Laterality: Right;   COLONOSCOPY     COLONOSCOPY WITH ESOPHAGOGASTRODUODENOSCOPY (EGD)  COLONOSCOPY WITH PROPOFOL  N/A 12/14/2014   Procedure: COLONOSCOPY WITH PROPOFOL ;  Surgeon: Gladis RAYMOND Mariner, MD;  Location: [facility];  Service: Endoscopy;  Laterality: N/A;   COLONOSCOPY WITH PROPOFOL  N/A 03/18/2018   Procedure: COLONOSCOPY WITH PROPOFOL ;  Surgeon: Mariner Gladis RAYMOND, MD;  Location: [facility] ENDOSCOPY;  Service: Endoscopy;  Laterality: N/A;   ENUCLEATION     ESOPHAGOGASTRODUODENOSCOPY N/A 08/30/2020   Procedure: ESOPHAGOGASTRODUODENOSCOPY (EGD);  Surgeon: Maryruth Ole DASEN, MD;  [facility];  Service: Endoscopy;  Laterality: N/A;   ESOPHAGOGASTRODUODENOSCOPY (EGD) WITH PROPOFOL  N/A 12/14/2014   Procedure: ESOPHAGOGASTRODUODENOSCOPY (EGD) WITH PROPOFOL ;  Surgeon: Gladis RAYMOND Mariner, MD;  Location: [facility] ENDOSCOPY;  Service: Endoscopy;  Laterality: N/A;    ESOPHAGOGASTRODUODENOSCOPY (EGD) WITH PROPOFOL  N/A 10/18/2016   Procedure: ESOPHAGOGASTRODUODENOSCOPY (EGD) WITH PROPOFOL ;  Surgeon: Mariner Gladis RAYMOND, MD;  Location: [facility] ENDOSCOPY;  Service: Endoscopy;  Laterality: N/A;   ESOPHAGOGASTRODUODENOSCOPY (EGD) WITH PROPOFOL  N/A 03/18/2018   Procedure: ESOPHAGOGASTRODUODENOSCOPY (EGD) WITH PROPOFOL ;  Surgeon: Mariner Gladis RAYMOND, MD;  Location: [facility] END[facility]COPY;  Service: Endoscopy;  Laterality: N/A;   ESOPHAGOGASTRODUODENOSCOPY (EGD) WITH PROPOFOL  N/A 12/19/2020   Procedure: ESOPHAGOGASTRODUODENOSCOPY (EGD) WITH PROPOFOL ;  Surgeon: Maryruth Ole DASEN, MD;  Location: [facility] ENDOSCOPY;  Service: Endoscopy;  Laterality: N/A;   EYE SURGERY     FRACTURE SURGERY     HIP SURGERY Left    pin and plate   IR ANGIO INTRA EXTRACRAN SEL COM CAROTID INNOMINATE BILAT MOD SED  10/17/2018   IR ANGIO VERTEBRAL SEL VERTEBRAL BILAT MOD SED  10/17/2018   IR ANGIOGRAM EXTREMITY LEFT  10/17/2018   JOINT REPLACEMENT     Left Total Knee Arthroplasty     Dr Mardee and also had 39 years ago from 2020    nasal polyps     removed x 2 affects sense of smell x 10-15 years from 04/28/20   prosthesis eye implant     REPLACEMENT UNICONDYLAR JOINT KNEE Left    TONSILLECTOMY     TOTAL KNEE REVISION Left 09/13/2017   Procedure: TOTAL KNEE REVISION;  Surgeon: Mardee Lynwood SQUIBB, MD;  Location: [facility] ORS;  Service: Orthopedics;  Laterality: Left;    Allergies  Allergen Reactions   Amoxicillin Other (See Comments)    Pencillins   Cefzil [Cefprozil] Other (See Comments)    Unknown   Cephalexin Other (See Comments)    Unknown   Propoxyphene Other (See Comments)    Unknown   Relafen [Nabumetone] Other (See Comments)    Unknown   Talwin [Pentazocine] Other (See Comments)    Unknown   Penicillins Other (See Comments)    Cannot remember reaction      Objective: Physical Exam General: The patient is alert and oriented x3 in no acute distress.  Dermatology: Skin is warm,  dry and supple bilateral lower extremities. Negative for open lesions or macerations bilateral.   Vascular: Dorsalis Pedis and Posterior Tibial pulses palpable bilateral.  Capillary fill time is immediate to all digits.  Neurological: Grossly intact via light touch  Musculoskeletal: Negative for any appreciable tenderness to palpation to the plantar aspect of the right heel along the plantar fascia. All other joints range of motion within normal limits bilateral. Strength 5/5 in all groups bilateral.   Radiographic exam RT foot 12/24/2023: Normal osseous mineralization. Joint spaces preserved. No fracture/dislocation/boney destruction. No other soft tissue abnormalities or radiopaque foreign bodies.  Small posterior and plantar heel spur noted on lateral view  Assessment: 1. Plantar fasciitis right; improved  Plan of Care:  -Patient  evaluated.  -Declined cortisone injection today -Continue Vionic tennis shoes -Recommend OTC Tylenol  as needed.  No NSAIDs -Return to clinic PRN  Thresa EMERSON Sar, DPM [facility]  Dr. Thresa EMERSON Sar, DPM    [address] [place], [place] [address]                Office [phone]  Fax [phone]

## 2024-03-04 ENCOUNTER — Telehealth: Payer: Self-pay

## 2024-03-04 DIAGNOSIS — J4550 Severe persistent asthma, uncomplicated: Secondary | ICD-10-CM

## 2024-03-04 NOTE — Telephone Encounter (Signed)
 Copied from CRM [id number]. Topic: Clinical - Medication Question >> Mar 04, 2024 11:02 AM Dedra B wrote: Reason for CRM: Patient received a letter from [facility] stating that they don't have an active prescription on file for her Fasenra . [employer] can be reached at [phone]. She is due for an injection next week. Please would like to be notified when prescription has been sent. She said a message can be left if she doesn't answer.

## 2024-03-05 MED ORDER — FASENRA PEN 30 MG/ML ~~LOC~~ SOAJ: 30.0000 mg | SUBCUTANEOUS | 3 refills | Status: AC

## 2024-03-05 NOTE — Telephone Encounter (Signed)
 Called [employer] - Fasenra  last filled on 03/04/24. Out for delivery today. Approved through program through 02/18/25.  E-prescribed renewal Rx to [employer].

## 2024-03-05 NOTE — Addendum Note (Signed)
 Addended by: Kingdavid Leinbach L on: 03/05/2024 09:33 AM   Modules accepted: Orders

## 2024-03-12 ENCOUNTER — Ambulatory Visit: Payer: Self-pay

## 2024-03-12 ENCOUNTER — Ambulatory Visit

## 2024-03-12 VITALS — BP 120/76 | HR 80 | Temp 99.0°F | Ht 65.5 in | Wt 148.6 lb

## 2024-03-12 DIAGNOSIS — K219 Gastro-esophageal reflux disease without esophagitis: Secondary | ICD-10-CM

## 2024-03-12 DIAGNOSIS — M810 Age-related osteoporosis without current pathological fracture: Secondary | ICD-10-CM | POA: Diagnosis not present

## 2024-03-12 DIAGNOSIS — H8110 Benign paroxysmal vertigo, unspecified ear: Secondary | ICD-10-CM | POA: Diagnosis not present

## 2024-03-12 DIAGNOSIS — Z79899 Other long term (current) drug therapy: Secondary | ICD-10-CM

## 2024-03-12 DIAGNOSIS — E782 Mixed hyperlipidemia: Secondary | ICD-10-CM | POA: Diagnosis not present

## 2024-03-12 DIAGNOSIS — F5101 Primary insomnia: Secondary | ICD-10-CM

## 2024-03-12 DIAGNOSIS — E039 Hypothyroidism, unspecified: Secondary | ICD-10-CM

## 2024-03-12 DIAGNOSIS — F39 Unspecified mood [affective] disorder: Secondary | ICD-10-CM | POA: Diagnosis not present

## 2024-03-12 LAB — VITAMIN B12: Vitamin B-12: 194 pg/mL — ABNORMAL LOW (ref 211–911)

## 2024-03-12 LAB — LIPID PANEL
Cholesterol: 143 mg/dL (ref 28–200)
HDL: 56.4 mg/dL
LDL Cholesterol: 75 mg/dL (ref 10–99)
NonHDL: 86.59
Total CHOL/HDL Ratio: 3
Triglycerides: 60 mg/dL (ref 10.0–149.0)
VLDL: 12 mg/dL (ref 0.0–40.0)

## 2024-03-12 LAB — TSH: TSH: 9.64 u[IU]/mL — ABNORMAL HIGH (ref 0.35–5.50)

## 2024-03-12 MED ORDER — MIRTAZAPINE 15 MG PO TABS: 15.0000 mg | ORAL_TABLET | Freq: Every day | ORAL | 1 refills | Status: AC

## 2024-03-12 NOTE — Assessment & Plan Note (Signed)
 History of BPPV, asymptomatic today.  Has tried meclizine  in the past.  Has found a vestibular therapy by ENT to be beneficial when she has symptoms.  No change in treatment recommended today as patient is asymptomatic.

## 2024-03-12 NOTE — Assessment & Plan Note (Addendum)
 On Protonix .  Check B12. Orders:   B12

## 2024-03-12 NOTE — Assessment & Plan Note (Signed)
 Continue mirtazapine  50 mg daily.  Okay to take Tylenol  500 mg before bedtime.  Future refill sent. Orders:   mirtazapine  (REMERON ) 15 MG tablet; Take 1 tablet (15 mg total) by mouth at bedtime.

## 2024-03-12 NOTE — Assessment & Plan Note (Addendum)
 Will check lipid panel today.  Encouraged patient to take simvastatin  20 mg twice a week.  Not needing refill at this time. Orders:   Lipid panel

## 2024-03-12 NOTE — Progress Notes (Signed)
 "  Established Patient Office Visit   Subjective  Patient ID: Dorothy Church, female    DOB: 1936-02-14  Age: 89 y.o. MRN: [id number]  Chief Complaint  Patient presents with   Hypothyroidism   Insomnia   Asthma   Gastroesophageal Reflux    Discussed the use of AI scribe software for clinical note transcription with the patient, who gave verbal consent to proceed.  History of Present Illness Dorothy Church is an 89 year old female who presents for medication management and follow-up. - She has a history of osteoporosis, most recent DEXA in osteopenia range and is on Reclast  for treatment.  She gets this through [facility].  She also takes calcium  and vitamin D  supplement for this.  -GERD: With previous history of esophagitis, biopsy negative for Barrett's or dysplasia in 12/19/2020.  She is now established with [facility].  She is on Protonix  twice a day and was started on Carafate  during her last visit with GI on 02/06/2024.  -She has a history of hypothyroidism she is taking levothyroxine  50 mcg daily but finds it challenging to fit into her schedule due to the need to take it on an empty stomach. She attempts to take it when she wakes up at night to use the bathroom.   - Mood, sleep:  Currently on escitalopram  10 mg, mirtazapine  50 mg daily. Patient finds Tylenol  500 mg an hour before bedtime to help with her sleep. Previously insomnia was treated with different medication including Ambien, Tylenol  PM.  She is no longer taking either of these medication.  - She has a history of asthma and follows up with pulmonology for this.  - Vertigo:  She has a history of vertigo, which is triggered by certain head positions, such as when at the dentist. She has seen an ENT and undergone head manipulation to address this issue. She does not take meclizine  for vertigo.  Has been prescribed this in the past.  - Hyperlipidemia: She has been prescribed simvastatin  20 mg which she  takes 1 to 2 days/week.      ROS As per HPI    Objective:     BP 120/76 (BP Location: Right Arm, Patient Position: Sitting)   Pulse 80   Temp 99 F (37.2 C) (Oral)   Ht 5' 5.5 (1.664 m)   Wt 148 lb 9.6 oz (67.4 kg)   SpO2 95%   BMI 24.35 kg/m      03/12/2024   10:01 AM 11/27/2023    2:08 PM 09/24/2023   11:03 AM  Depression screen PHQ 2/9  Decreased Interest 0 1 0  Down, Depressed, Hopeless 0 0 0  PHQ - 2 Score 0 1 0  Altered sleeping 2 3   Tired, decreased energy 1 1   Change in appetite 0 0   Feeling bad or failure about yourself  0 0   Trouble concentrating 0 0   Moving slowly or fidgety/restless 0 0   Suicidal thoughts 0 0   PHQ-9 Score 3 5    Difficult doing work/chores Somewhat difficult Somewhat difficult      Data saved with a previous flowsheet row definition      03/12/2024   10:01 AM 11/27/2023    2:08 PM 10/08/2022    2:51 PM 09/10/2022    1:06 PM  GAD 7 : Generalized Anxiety Score  Nervous, Anxious, on Edge 0 2  1  2    Control/stop worrying 0 2  0  1  Worry too much - different things 0 1  0  1   Trouble relaxing 1 2  2  2    Restless 0 1  2  2    Easily annoyed or irritable 0 1  1  0   Afraid - awful might happen 1 1  1   0   Total GAD 7 Score 2 10 7 8   Anxiety Difficulty Somewhat difficult Somewhat difficult Somewhat difficult Somewhat difficult     Data saved with a previous flowsheet row definition      03/12/2024   10:01 AM 11/27/2023    2:08 PM 09/24/2023   11:03 AM  Depression screen PHQ 2/9  Decreased Interest 0 1 0  Down, Depressed, Hopeless 0 0 0  PHQ - 2 Score 0 1 0  Altered sleeping 2 3   Tired, decreased energy 1 1   Change in appetite 0 0   Feeling bad or failure about yourself  0 0   Trouble concentrating 0 0   Moving slowly or fidgety/restless 0 0   Suicidal thoughts 0 0   PHQ-9 Score 3 5    Difficult doing work/chores Somewhat difficult Somewhat difficult      Data saved with a previous flowsheet row definition       03/12/2024   10:01 AM 11/27/2023    2:08 PM 10/08/2022    2:51 PM 09/10/2022    1:06 PM  GAD 7 : Generalized Anxiety Score  Nervous, Anxious, on Edge 0 2  1  2    Control/stop worrying 0 2  0  1   Worry too much - different things 0 1  0  1   Trouble relaxing 1 2  2  2    Restless 0 1  2  2    Easily annoyed or irritable 0 1  1  0   Afraid - awful might happen 1 1  1   0   Total GAD 7 Score 2 10 7 8   Anxiety Difficulty Somewhat difficult Somewhat difficult Somewhat difficult Somewhat difficult     Data saved with a previous flowsheet row definition   SDOH Screenings   Food Insecurity: No Food Insecurity (12/12/2023)  Housing: Unknown (12/15/2023)   Received from [facility]  Transportation Needs: No Transportation Needs (12/12/2023)  Utilities: Not At Risk (06/27/2023)  Alcohol  Screen: Low Risk (03/21/2023)  Depression (PHQ2-9): Low Risk (03/12/2024)  Financial Resource Strain: Low Risk (12/12/2023)  Physical Activity: Inactive (12/12/2023)  Social Connections: Moderately Integrated (12/12/2023)  Stress: No Stress Concern Present (12/12/2023)  Recent Concern: Stress - Stress Concern Present (09/20/2023)  Tobacco Use: Low Risk (03/12/2024)     Physical Exam Constitutional:      General: She is not in acute distress. HENT:     Head: Normocephalic and atraumatic.     Right Ear: Tympanic membrane normal.     Left Ear: Tympanic membrane normal.     Mouth/Throat:     Mouth: Mucous membranes are moist.  Cardiovascular:     Rate and Rhythm: Normal rate.  Pulmonary:     Effort: Pulmonary effort is normal.     Breath sounds: Normal breath sounds.  Abdominal:     Palpations: Abdomen is soft.     Tenderness: There is no abdominal tenderness.  Musculoskeletal:     Cervical back: Neck supple.     Right lower leg: No edema.     Left lower leg: No edema.  Neurological:     Mental Status: She  is alert.  Psychiatric:        Mood and Affect: Mood normal.        No  results found for any visits on 03/12/24.  The ASCVD Risk score (Arnett DK, et al., 2019) failed to calculate for the following reasons:   The 2019 ASCVD risk score is only valid for ages 7 to 40   * - Cholesterol units were assumed     Assessment & Plan:  Patient is a pleasant 89 year old female presenting for chronic disease management. Assessment & Plan Medication management On Protonix .  Check B12. Orders:   B12  Acquired hypothyroidism Encouraged patient to take levothyroxine  50 mcg daily.  Will check TSH today. Orders:   TSH  Mixed hyperlipidemia Will check lipid panel today.  Encouraged patient to take simvastatin  20 mg twice a week.  Not needing refill at this time. Orders:   Lipid panel  Primary insomnia Continue mirtazapine  50 mg daily.  Okay to take Tylenol  500 mg before bedtime.  Future refill sent. Orders:   mirtazapine  (REMERON ) 15 MG tablet; Take 1 tablet (15 mg total) by mouth at bedtime.  Mood disorder Continue escitalopram  10 mg daily.    Benign paroxysmal positional vertigo, unspecified laterality History of BPPV, asymptomatic today.  Has tried meclizine  in the past.  Has found a vestibular therapy by ENT to be beneficial when she has symptoms.  No change in treatment recommended today as patient is asymptomatic.    Gastroesophageal reflux disease, unspecified whether esophagitis present Currently on Protonix  twice a day, started Carafate  by GI.  Symptoms seems to be stable on current treatment regimen.  Continue follow-up with GI for further evaluation and management.    Age-related osteoporosis without current pathological fracture On Reclast , managed by endocrinology.  Taking calcium  and vitamin D  supplement daily.  Continue follow-up with endocrinology for osteoporosis management.     Return in about 4 months (around 07/10/2024) for Chronic follow up .   Luke Shade, MD "

## 2024-03-12 NOTE — Assessment & Plan Note (Signed)
 On Reclast , managed by endocrinology.  Taking calcium  and vitamin D  supplement daily.  Continue follow-up with endocrinology for osteoporosis management.

## 2024-03-12 NOTE — Assessment & Plan Note (Signed)
 Currently on Protonix  twice a day, started Carafate  by GI.  Symptoms seems to be stable on current treatment regimen.  Continue follow-up with GI for further evaluation and management.

## 2024-03-12 NOTE — Patient Instructions (Addendum)
 It is safe for you to take Tylenol  500 mg before bedtime if this helps with sleep.  Please continue to take Levothyroxine  daily whenever it is convenient for you,  Take Simvastatin  20 mg twice a week if possible.  We are checking your cholesterol, B12 and thyroid  level today. I will reach out to you with results.  Follow up in 4 months or sooner if you need us .

## 2024-03-12 NOTE — Assessment & Plan Note (Addendum)
 Encouraged patient to take levothyroxine  50 mcg daily.  Will check TSH today. Orders:   TSH

## 2024-03-12 NOTE — Assessment & Plan Note (Signed)
"   Continue escitalopram  10 mg daily.  "

## 2024-03-13 NOTE — Telephone Encounter (Signed)
 Noted.

## 2024-03-14 ENCOUNTER — Other Ambulatory Visit: Payer: Self-pay | Admitting: Internal Medicine

## 2024-03-14 DIAGNOSIS — J309 Allergic rhinitis, unspecified: Secondary | ICD-10-CM

## 2024-03-18 ENCOUNTER — Ambulatory Visit

## 2024-03-18 DIAGNOSIS — J4550 Severe persistent asthma, uncomplicated: Secondary | ICD-10-CM | POA: Diagnosis not present

## 2024-03-18 DIAGNOSIS — R0602 Shortness of breath: Secondary | ICD-10-CM

## 2024-03-18 LAB — PULMONARY FUNCTION TEST
DL/VA % pred: 87 %
DL/VA: 3.52 ml/min/mmHg/L
DLCO unc % pred: 76 %
DLCO unc: 14.78 ml/min/mmHg
FEF 25-75 Post: 0.32 L/s
FEF 25-75 Pre: 0.41 L/s
FEF2575-%Change-Post: -21 %
FEF2575-%Pred-Post: 26 %
FEF2575-%Pred-Pre: 33 %
FEV1-%Change-Post: -10 %
FEV1-%Pred-Post: 46 %
FEV1-%Pred-Pre: 52 %
FEV1-Post: 0.89 L
FEV1-Pre: 1 L
FEV1FVC-%Change-Post: -4 %
FEV1FVC-%Pred-Pre: 72 %
FEV6-%Change-Post: -7 %
FEV6-%Pred-Post: 71 %
FEV6-%Pred-Pre: 77 %
FEV6-Post: 1.74 L
FEV6-Pre: 1.89 L
FEV6FVC-%Change-Post: -1 %
FEV6FVC-%Pred-Post: 103 %
FEV6FVC-%Pred-Pre: 104 %
FVC-%Change-Post: -6 %
FVC-%Pred-Post: 68 %
FVC-%Pred-Pre: 73 %
FVC-Post: 1.78 L
FVC-Pre: 1.91 L
Post FEV1/FVC ratio: 50 %
Post FEV6/FVC ratio: 98 %
Pre FEV1/FVC ratio: 52 %
Pre FEV6/FVC Ratio: 99 %
RV % pred: 167 %
RV: 4.37 L
TLC % pred: 123 %
TLC: 6.63 L

## 2024-03-18 NOTE — Patient Instructions (Signed)
 Full PFT completed today ? ?

## 2024-03-18 NOTE — Progress Notes (Signed)
 Full PFT completed today ? ?

## 2024-03-20 ENCOUNTER — Ambulatory Visit: Payer: Self-pay | Admitting: Pulmonary Disease

## 2024-03-27 ENCOUNTER — Ambulatory Visit: Admitting: Pulmonary Disease

## 2024-03-27 ENCOUNTER — Encounter: Payer: Self-pay | Admitting: Pulmonary Disease

## 2024-03-27 ENCOUNTER — Other Ambulatory Visit: Payer: Self-pay | Admitting: Gastroenterology

## 2024-03-27 VITALS — BP 120/80 | HR 88 | Temp 97.6°F | Ht 66.0 in | Wt 148.0 lb

## 2024-03-27 DIAGNOSIS — R0602 Shortness of breath: Secondary | ICD-10-CM

## 2024-03-27 DIAGNOSIS — K2100 Gastro-esophageal reflux disease with esophagitis, without bleeding: Secondary | ICD-10-CM

## 2024-03-27 DIAGNOSIS — J4550 Severe persistent asthma, uncomplicated: Secondary | ICD-10-CM

## 2024-03-27 LAB — NITRIC OXIDE: Nitric Oxide: 16

## 2024-03-27 NOTE — Progress Notes (Unsigned)
 "  Subjective:    Patient ID: Dorothy Church, female    DOB: 1935-07-09, 89 y.o.   MRN: [id number]  Patient Care Team: Bair, Kalpana, MD as PCP - General (Family Medicine) Avram Barnie NOVAK, FNP (Nurse Practitioner) Tamea Dedra CROME, MD as Consulting Physician (Pulmonary Disease)  Chief Complaint  Patient presents with   Asthma    No cough, shortness of breath or wheezing. Good compliance and tolerance of Fasenra .     BACKGROUND/INTERVAL:  HPI  DATA 08/16/2014 PFTs: FEV1 0.93 L or 39% predicted, FVC 1.81 L or 57% predicted, FEV1/FVC 51%.  There is a significant bronchodilator response with a 56% net change on FEV1 postbronchodilator to 1.46 L or 62% of predicted.  Fusion capacity was normal by alveolar volume.  Lung volumes normal. 12/12/2022 alpha 1 antitrypsin: MM phenotype, 156 mg/dL (normal) 89/76/7975 CBC with differential and allergen panel: No eosinophilia noted, IgE is 103, RAST panel positive for significant responses to dust mites less significant responses to cat dander, dog dander and ragweed. 12/12/2022 CXR PA and lateral: Hyperinflation, suggestion of COPD, no other acute cardiopulmonary disease 02/21/2023 PFTs: FEV1 0.68 L or 34% predicted, FVC 1.46 L or 55% predicted, FEV1/FVC 47%, there is a very significant postbronchodilator response with a net change of 35% in FEV1 post bronchodilator.  Lung volumes show mild hyperinflation and moderate air trapping.  Diffusion capacity moderately reduced.  Review of Systems A 10 point review of systems was performed and it is as noted above otherwise negative.   Patient Active Problem List   Diagnosis Date Noted   Lacunar infarction (HCC) 11/27/2023   Chronic idiopathic constipation 06/20/2023   Moderate persistent asthma without complication 06/20/2023   BPPV (benign paroxysmal positional vertigo) 03/13/2022   Allergic rhinitis 03/10/2021   Gastroesophageal reflux disease 09/28/2020   Gastric polyp 04/28/2020    Lower esophageal ring ([place]) 04/28/2020   Insomnia 12/30/2018   Hiatal hernia    Mood disorder 03/11/2017   Asthma, chronic, severe persistent, uncomplicated (HCC) 05/11/2016   OA (osteoarthritis) 04/04/2015   Medication management 04/23/2013   Hypothyroidism    Hyperlipidemia 08/07/2012   COAD (chronic obstructive airways disease) (HCC) 02/12/2012    Social History   Tobacco Use   Smoking status: Never   Smokeless tobacco: Never  Substance Use Topics   Alcohol  use: Not Currently    Alcohol /week: 1.0 standard drink of alcohol     Types: 1 Glasses of wine per week    Comment: occas. wine    Allergies[1]  Active Medications[2]  Immunization History  Administered Date(s) Administered   Fluad Quad(high Dose 65+) 12/05/2019, 11/04/2020   INFLUENZA, HIGH DOSE SEASONAL PF 12/02/2017, 12/08/2018, 11/29/2023   Influenza Whole 12/14/2010   Influenza,inj,Quad PF,6+ Mos 11/10/2012, 11/05/2013, 11/10/2014, 10/17/2015, 10/24/2016, 10/17/2021   Influenza-Unspecified 12/04/2011, 11/10/2012, 11/05/2013, 12/07/2018   Moderna Sars-Covid-2 Vaccination 03/03/2019, 03/31/2019, 11/30/2019, 12/26/2019, 07/07/2020   Pfizer(Comirnaty)Fall Seasonal Vaccine 12 years and older 12/01/2021, 08/07/2022   Pneumococcal Conjugate-13 10/07/2009, 04/23/2013, 11/24/2013   Pneumococcal Polysaccharide-23 11/13/2009, 12/19/2018   RSV,unspecified 08/13/2023   Td 04/17/2016   Unspecified SARS-COV-2 Vaccination 11/16/2022, 08/13/2023, 12/12/2023   Zoster Recombinant(Shingrix) 04/09/2018, 09/15/2018   Zoster, Live 05/08/2007        Objective:     Vitals:   03/27/24 0946  BP: 120/80  Pulse: 88  Temp: 97.6 F (36.4 C)  Height: 5' 6 (1.676 m)  Weight: 148 lb (67.1 kg)  SpO2: 96%  TempSrc: Temporal  BMI (Calculated): 23.9     SpO2: 96 %  GENERAL: Developed, well-nourished woman, no acute distress, fully ambulatory.  No conversational dyspnea. HEAD: Normocephalic,  atraumatic.  EYES: Pupils equal, round, reactive to light.  No scleral icterus.  MOUTH: Dentition intact, oral mucosa moist.  No thrush. NECK: Supple. No thyromegaly. Trachea midline. No JVD.  No adenopathy. PULMONARY: Poor air entry bilaterally.  Coarse, no overt adventitious sounds. CARDIOVASCULAR: S1 and S2. Regular rate and rhythm.  No rubs, murmurs or gallops heard. ABDOMEN: Benign. MUSCULOSKELETAL: No joint deformity, no clubbing, no edema.  NEUROLOGIC: No overt focal deficit, no gait disturbance, speech is fluent. SKIN: Intact,warm,dry. PSYCH: Mood and behavior normal.  Lab Results  Component Value Date   NITRICOXIDE 16 03/27/2024  This result suggests low (<25) Type 2 (T2) airway inflammation indicating a low likelihood of active T2-driven airway inflammation; reduced probability of response to inhaled corticosteroids.  In a patient on asthma management this suggests good control of T-2 inflammation.       Assessment & Plan:   No diagnosis found.  No orders of the defined types were placed in this encounter.   No orders of the defined types were placed in this encounter.     Advised if symptoms do not improve or worsen, to please contact office for sooner follow up or seek emergency care.    I spent xxx minutes of dedicated to the care of this patient on the date of this encounter to include pre-visit review of records, face-to-face time with the patient discussing conditions above, post visit ordering of testing, clinical documentation with the electronic health record, making appropriate referrals as documented, and communicating necessary findings to members of the patients care team.     C. Leita Sanders, MD Advanced Bronchoscopy [facility]    *This note was generated using voice recognition software/Dragon and/or AI transcription program.  Despite best efforts to proofread, errors can occur which can change the meaning. Any  transcriptional errors that result from this process are unintentional and may not be fully corrected at the time of dictation.     [1] Allergies Allergen Reactions   Amoxicillin Other (See Comments)    Pencillins   Cefzil [Cefprozil] Other (See Comments)    Unknown   Cephalexin Other (See Comments)    Unknown   Propoxyphene Other (See Comments)    Unknown   Relafen [Nabumetone] Other (See Comments)    Unknown   Talwin [Pentazocine] Other (See Comments)    Unknown   Penicillins Other (See Comments)    Cannot remember reaction  [2] Current Meds  Medication Sig   acetaminophen  (TYLENOL ) 500 MG tablet Take 1,000 mg by mouth every 8 (eight) hours as needed (PAIN).   albuterol  (VENTOLIN  HFA) 108 (90 Base) MCG/ACT inhaler Inhale 2 puffs into the lungs every 6 (six) hours as needed for wheezing.   aspirin  EC 81 MG tablet Take 1 tablet (81 mg total) by mouth daily. Swallow whole.   benralizumab  (FASENRA  PEN) 30 MG/ML prefilled autoinjector Inject 1 mL (30 mg total) into the skin every 8 (eight) weeks.   Budeson-Glycopyrrol-Formoterol  (BREZTRI  AEROSPHERE) 160-9-4.8 MCG/ACT AERO Inhale 2 puffs into the lungs in the morning and at bedtime.   calcium  carbonate (TUMS - DOSED IN MG ELEMENTAL CALCIUM ) 500 MG chewable tablet Chew 1 tablet by mouth 4 (four) times daily as needed for indigestion or heartburn.   Cholecalciferol (VITAMIN D3) 50 MCG (2000 UT) capsule Take 1 capsule (2,000 Units total) by mouth daily.   docusate sodium  (COLACE) 100 MG capsule Take 1  capsule (100 mg total) by mouth daily as needed for mild constipation.   escitalopram  (LEXAPRO ) 10 MG tablet Take 1 tablet (10 mg total) by mouth daily.   latanoprost (XALATAN) 0.005 % ophthalmic solution Place 1 drop into the right eye at bedtime.   levothyroxine  (SYNTHROID ) 50 MCG tablet TAKE 1 TABLET BY MOUTH EVERY DAY BEFORE BREAKFAST   loratadine  (CLARITIN ) 10 MG tablet TAKE 1 TABLET BY MOUTH EVERY DAY AS NEEDED FOR  ALLERGY    meclizine  (ANTIVERT ) 25 MG tablet Take 1 tablet (25 mg total) by mouth 3 (three) times daily as needed for dizziness.   mirtazapine  (REMERON ) 15 MG tablet Take 1 tablet (15 mg total) by mouth at bedtime.   pantoprazole  (PROTONIX ) 40 MG tablet TAKE 1 TABLET BY MOUTH TWICE A DAY   simvastatin  (ZOCOR ) 20 MG tablet Take 20 mg by mouth 2 (two) times a week.   sucralfate  (CARAFATE ) 1 g tablet Take 1 tablet (1 g total) by mouth 4 (four) times daily -  with meals and at bedtime.  "

## 2024-03-27 NOTE — Patient Instructions (Signed)
 VISIT SUMMARY:  Dorothy Church, an 89 year old female with severe persistent asthma, came in for a follow-up appointment. Her lung function has significantly improved from 34% to 52% over the past year with the current treatment plan. She has not needed to use her rescue inhaler recently, indicating better control of her asthma symptoms.  YOUR PLAN:  -SEVERE PERSISTENT ASTHMA: Severe persistent asthma is a chronic condition where the airways in the lungs are always inflamed and can become easily narrowed, making it hard to breathe. Your lung function has improved significantly from 34% to 52% over the past year with the current treatment plan. You should continue with Fasenra  injections and Breztri . We checked your airway inflammation levels today and will continue to monitor your progress. A follow-up appointment is scheduled in four months to ensure continued improvement.  INSTRUCTIONS:  Please continue with your current medications, Fasenra  injections and Breztri . We will see you again in four months for a follow-up appointment.

## 2024-03-31 ENCOUNTER — Ambulatory Visit

## 2024-04-01 ENCOUNTER — Ambulatory Visit

## 2024-04-29 ENCOUNTER — Encounter

## 2024-06-30 ENCOUNTER — Ambulatory Visit: Admitting: Nurse Practitioner

## 2024-07-15 ENCOUNTER — Ambulatory Visit

## 2024-07-27 ENCOUNTER — Ambulatory Visit: Admitting: Pulmonary Disease
